# Patient Record
Sex: Female | Born: 1935
Health system: Southern US, Community
[De-identification: ages and names within clinical notes are randomized; demographics above are authoritative.]

## PROBLEM LIST (undated history)

## (undated) DIAGNOSIS — K219 Gastro-esophageal reflux disease without esophagitis: Secondary | ICD-10-CM

## (undated) DIAGNOSIS — R441 Visual hallucinations: Secondary | ICD-10-CM

## (undated) DIAGNOSIS — F411 Generalized anxiety disorder: Secondary | ICD-10-CM

## (undated) DIAGNOSIS — M1990 Unspecified osteoarthritis, unspecified site: Secondary | ICD-10-CM

## (undated) DIAGNOSIS — M8580 Other specified disorders of bone density and structure, unspecified site: Secondary | ICD-10-CM

## (undated) DIAGNOSIS — R112 Nausea with vomiting, unspecified: Secondary | ICD-10-CM

## (undated) DIAGNOSIS — H409 Unspecified glaucoma: Secondary | ICD-10-CM

## (undated) DIAGNOSIS — Z973 Presence of spectacles and contact lenses: Secondary | ICD-10-CM

## (undated) DIAGNOSIS — E7800 Pure hypercholesterolemia, unspecified: Secondary | ICD-10-CM

## (undated) DIAGNOSIS — R51 Headache: Secondary | ICD-10-CM

## (undated) DIAGNOSIS — Z85828 Personal history of other malignant neoplasm of skin: Secondary | ICD-10-CM

## (undated) DIAGNOSIS — M48062 Spinal stenosis, lumbar region with neurogenic claudication: Secondary | ICD-10-CM

## (undated) DIAGNOSIS — Z87440 Personal history of urinary (tract) infections: Secondary | ICD-10-CM

## (undated) DIAGNOSIS — T8859XA Other complications of anesthesia, initial encounter: Secondary | ICD-10-CM

## (undated) DIAGNOSIS — R2681 Unsteadiness on feet: Secondary | ICD-10-CM

## (undated) DIAGNOSIS — N393 Stress incontinence (female) (male): Secondary | ICD-10-CM

## (undated) DIAGNOSIS — F32A Depression, unspecified: Secondary | ICD-10-CM

## (undated) DIAGNOSIS — C801 Malignant (primary) neoplasm, unspecified: Secondary | ICD-10-CM

## (undated) DIAGNOSIS — I10 Essential (primary) hypertension: Secondary | ICD-10-CM

## (undated) DIAGNOSIS — F419 Anxiety disorder, unspecified: Secondary | ICD-10-CM

## (undated) DIAGNOSIS — K635 Polyp of colon: Secondary | ICD-10-CM

## (undated) DIAGNOSIS — R519 Headache, unspecified: Secondary | ICD-10-CM

## (undated) DIAGNOSIS — F329 Major depressive disorder, single episode, unspecified: Secondary | ICD-10-CM

## (undated) DIAGNOSIS — L280 Lichen simplex chronicus: Secondary | ICD-10-CM

## (undated) DIAGNOSIS — Z789 Other specified health status: Secondary | ICD-10-CM

## (undated) DIAGNOSIS — Z9889 Other specified postprocedural states: Secondary | ICD-10-CM

## (undated) DIAGNOSIS — T4145XA Adverse effect of unspecified anesthetic, initial encounter: Secondary | ICD-10-CM

## (undated) DIAGNOSIS — H3530 Unspecified macular degeneration: Secondary | ICD-10-CM

## (undated) HISTORY — DX: Generalized anxiety disorder: F41.1

## (undated) HISTORY — DX: Major depressive disorder, single episode, unspecified: F32.9

## (undated) HISTORY — DX: Anxiety disorder, unspecified: F41.9

## (undated) HISTORY — DX: Lichen simplex chronicus: L28.0

## (undated) HISTORY — PX: EYE SURGERY: SHX253

## (undated) HISTORY — DX: Visual hallucinations: R44.1

## (undated) HISTORY — DX: Polyp of colon: K63.5

## (undated) HISTORY — DX: Pure hypercholesterolemia, unspecified: E78.00

## (undated) HISTORY — DX: Personal history of urinary (tract) infections: Z87.440

## (undated) HISTORY — DX: Gastro-esophageal reflux disease without esophagitis: K21.9

## (undated) HISTORY — DX: Unspecified osteoarthritis, unspecified site: M19.90

## (undated) HISTORY — PX: BLADDER REPAIR: SHX76

## (undated) HISTORY — DX: Essential (primary) hypertension: I10

## (undated) HISTORY — DX: Other specified disorders of bone density and structure, unspecified site: M85.80

## (undated) HISTORY — DX: Stress incontinence (female) (male): N39.3

## (undated) HISTORY — PX: COLONOSCOPY: SHX174

## (undated) HISTORY — DX: Personal history of other malignant neoplasm of skin: Z85.828

## (undated) HISTORY — PX: TONSILLECTOMY AND ADENOIDECTOMY: SUR1326

## (undated) HISTORY — PX: CHOLECYSTECTOMY: SHX55

## (undated) HISTORY — DX: Depression, unspecified: F32.A

## (undated) HISTORY — PX: MOHS SURGERY: SUR867

## (undated) HISTORY — PX: OTHER SURGICAL HISTORY: SHX169

## (undated) HISTORY — PX: APPENDECTOMY: SHX54

---

## 1975-08-12 HISTORY — PX: ABDOMINAL HYSTERECTOMY: SHX81

## 1999-01-02 ENCOUNTER — Other Ambulatory Visit: Admission: RE | Admit: 1999-01-02 | Discharge: 1999-01-02 | Payer: Self-pay | Admitting: Gynecology

## 1999-08-21 ENCOUNTER — Encounter: Payer: Self-pay | Admitting: *Deleted

## 1999-08-21 ENCOUNTER — Emergency Department (HOSPITAL_COMMUNITY): Admission: EM | Admit: 1999-08-21 | Discharge: 1999-08-21 | Payer: Self-pay | Admitting: *Deleted

## 2000-01-22 ENCOUNTER — Other Ambulatory Visit: Admission: RE | Admit: 2000-01-22 | Discharge: 2000-01-22 | Payer: Self-pay | Admitting: Gynecology

## 2000-03-02 ENCOUNTER — Encounter (INDEPENDENT_AMBULATORY_CARE_PROVIDER_SITE_OTHER): Payer: Self-pay

## 2000-03-02 ENCOUNTER — Ambulatory Visit (HOSPITAL_COMMUNITY): Admission: RE | Admit: 2000-03-02 | Discharge: 2000-03-02 | Payer: Self-pay | Admitting: Gastroenterology

## 2000-03-17 ENCOUNTER — Ambulatory Visit (HOSPITAL_COMMUNITY): Admission: RE | Admit: 2000-03-17 | Discharge: 2000-03-17 | Payer: Self-pay | Admitting: Gastroenterology

## 2000-03-18 ENCOUNTER — Encounter: Payer: Self-pay | Admitting: Gastroenterology

## 2000-03-18 ENCOUNTER — Ambulatory Visit (HOSPITAL_COMMUNITY): Admission: RE | Admit: 2000-03-18 | Discharge: 2000-03-18 | Payer: Self-pay | Admitting: Gastroenterology

## 2000-03-20 ENCOUNTER — Ambulatory Visit (HOSPITAL_COMMUNITY): Admission: RE | Admit: 2000-03-20 | Discharge: 2000-03-20 | Payer: Self-pay | Admitting: Gastroenterology

## 2000-03-20 ENCOUNTER — Encounter: Payer: Self-pay | Admitting: Gastroenterology

## 2000-06-16 ENCOUNTER — Encounter: Payer: Self-pay | Admitting: Gastroenterology

## 2000-06-16 ENCOUNTER — Ambulatory Visit (HOSPITAL_COMMUNITY): Admission: RE | Admit: 2000-06-16 | Discharge: 2000-06-16 | Payer: Self-pay | Admitting: Gastroenterology

## 2001-07-19 ENCOUNTER — Other Ambulatory Visit: Admission: RE | Admit: 2001-07-19 | Discharge: 2001-07-19 | Payer: Self-pay | Admitting: Gynecology

## 2001-09-13 ENCOUNTER — Emergency Department (HOSPITAL_COMMUNITY): Admission: EM | Admit: 2001-09-13 | Discharge: 2001-09-13 | Payer: Self-pay | Admitting: Emergency Medicine

## 2001-09-13 ENCOUNTER — Encounter: Payer: Self-pay | Admitting: Emergency Medicine

## 2001-09-18 ENCOUNTER — Emergency Department (HOSPITAL_COMMUNITY): Admission: EM | Admit: 2001-09-18 | Discharge: 2001-09-18 | Payer: Self-pay | Admitting: Emergency Medicine

## 2001-11-18 ENCOUNTER — Emergency Department (HOSPITAL_COMMUNITY): Admission: EM | Admit: 2001-11-18 | Discharge: 2001-11-18 | Payer: Self-pay

## 2003-03-23 ENCOUNTER — Ambulatory Visit (HOSPITAL_COMMUNITY): Admission: RE | Admit: 2003-03-23 | Discharge: 2003-03-23 | Payer: Self-pay | Admitting: Orthopedic Surgery

## 2003-03-23 ENCOUNTER — Encounter: Payer: Self-pay | Admitting: Orthopedic Surgery

## 2003-03-27 ENCOUNTER — Inpatient Hospital Stay (HOSPITAL_COMMUNITY): Admission: RE | Admit: 2003-03-27 | Discharge: 2003-03-28 | Payer: Self-pay | Admitting: Orthopedic Surgery

## 2003-03-27 ENCOUNTER — Encounter: Payer: Self-pay | Admitting: Orthopedic Surgery

## 2003-03-27 ENCOUNTER — Encounter (INDEPENDENT_AMBULATORY_CARE_PROVIDER_SITE_OTHER): Payer: Self-pay | Admitting: *Deleted

## 2004-03-26 ENCOUNTER — Other Ambulatory Visit: Admission: RE | Admit: 2004-03-26 | Discharge: 2004-03-26 | Payer: Self-pay | Admitting: Gynecology

## 2004-07-18 ENCOUNTER — Ambulatory Visit: Payer: Self-pay | Admitting: Internal Medicine

## 2004-10-22 ENCOUNTER — Ambulatory Visit: Payer: Self-pay | Admitting: Internal Medicine

## 2004-10-29 ENCOUNTER — Ambulatory Visit: Payer: Self-pay | Admitting: Internal Medicine

## 2005-06-13 ENCOUNTER — Emergency Department (HOSPITAL_COMMUNITY): Admission: EM | Admit: 2005-06-13 | Discharge: 2005-06-14 | Payer: Self-pay | Admitting: Emergency Medicine

## 2005-06-14 ENCOUNTER — Ambulatory Visit: Payer: Self-pay | Admitting: Family Medicine

## 2005-06-16 ENCOUNTER — Ambulatory Visit: Payer: Self-pay | Admitting: Internal Medicine

## 2005-08-11 HISTORY — PX: URETHRAL SLING: SHX2621

## 2005-09-03 ENCOUNTER — Ambulatory Visit: Payer: Self-pay | Admitting: Internal Medicine

## 2005-10-29 ENCOUNTER — Encounter: Admission: RE | Admit: 2005-10-29 | Discharge: 2005-10-29 | Payer: Self-pay | Admitting: Rheumatology

## 2005-12-10 ENCOUNTER — Other Ambulatory Visit: Admission: RE | Admit: 2005-12-10 | Discharge: 2005-12-10 | Payer: Self-pay | Admitting: Gynecology

## 2006-07-10 ENCOUNTER — Ambulatory Visit (HOSPITAL_COMMUNITY): Admission: RE | Admit: 2006-07-10 | Discharge: 2006-07-11 | Payer: Self-pay | Admitting: Urology

## 2006-07-30 ENCOUNTER — Ambulatory Visit: Payer: Self-pay | Admitting: Internal Medicine

## 2007-06-22 ENCOUNTER — Encounter: Payer: Self-pay | Admitting: Internal Medicine

## 2007-10-19 ENCOUNTER — Encounter: Payer: Self-pay | Admitting: *Deleted

## 2007-10-19 DIAGNOSIS — F329 Major depressive disorder, single episode, unspecified: Secondary | ICD-10-CM

## 2007-10-19 DIAGNOSIS — C44309 Unspecified malignant neoplasm of skin of other parts of face: Secondary | ICD-10-CM | POA: Insufficient documentation

## 2007-10-19 DIAGNOSIS — N39498 Other specified urinary incontinence: Secondary | ICD-10-CM

## 2007-10-19 DIAGNOSIS — M1990 Unspecified osteoarthritis, unspecified site: Secondary | ICD-10-CM | POA: Insufficient documentation

## 2007-10-19 DIAGNOSIS — I10 Essential (primary) hypertension: Secondary | ICD-10-CM

## 2007-10-19 DIAGNOSIS — J309 Allergic rhinitis, unspecified: Secondary | ICD-10-CM

## 2007-10-19 DIAGNOSIS — K219 Gastro-esophageal reflux disease without esophagitis: Secondary | ICD-10-CM | POA: Insufficient documentation

## 2007-10-19 DIAGNOSIS — C44300 Unspecified malignant neoplasm of skin of unspecified part of face: Secondary | ICD-10-CM | POA: Insufficient documentation

## 2007-10-19 DIAGNOSIS — A3790 Whooping cough, unspecified species without pneumonia: Secondary | ICD-10-CM | POA: Insufficient documentation

## 2007-10-19 DIAGNOSIS — D126 Benign neoplasm of colon, unspecified: Secondary | ICD-10-CM

## 2007-10-19 DIAGNOSIS — Z9089 Acquired absence of other organs: Secondary | ICD-10-CM

## 2007-10-19 DIAGNOSIS — Z87448 Personal history of other diseases of urinary system: Secondary | ICD-10-CM | POA: Insufficient documentation

## 2007-10-19 HISTORY — DX: Essential (primary) hypertension: I10

## 2007-10-19 HISTORY — DX: Allergic rhinitis, unspecified: J30.9

## 2007-10-19 HISTORY — DX: Gastro-esophageal reflux disease without esophagitis: K21.9

## 2007-10-19 HISTORY — DX: Other specified urinary incontinence: N39.498

## 2007-11-17 ENCOUNTER — Encounter: Payer: Self-pay | Admitting: Internal Medicine

## 2008-03-06 ENCOUNTER — Telehealth: Payer: Self-pay | Admitting: Internal Medicine

## 2008-03-07 ENCOUNTER — Ambulatory Visit: Payer: Self-pay | Admitting: Internal Medicine

## 2008-03-07 DIAGNOSIS — J069 Acute upper respiratory infection, unspecified: Secondary | ICD-10-CM | POA: Insufficient documentation

## 2008-03-09 ENCOUNTER — Ambulatory Visit: Payer: Self-pay | Admitting: Internal Medicine

## 2008-05-01 ENCOUNTER — Encounter: Payer: Self-pay | Admitting: Internal Medicine

## 2008-08-22 ENCOUNTER — Encounter: Payer: Self-pay | Admitting: Internal Medicine

## 2008-11-28 ENCOUNTER — Telehealth: Payer: Self-pay | Admitting: Internal Medicine

## 2009-01-04 ENCOUNTER — Encounter: Payer: Self-pay | Admitting: Internal Medicine

## 2009-03-13 ENCOUNTER — Ambulatory Visit: Payer: Self-pay | Admitting: Internal Medicine

## 2009-03-13 DIAGNOSIS — IMO0002 Reserved for concepts with insufficient information to code with codable children: Secondary | ICD-10-CM

## 2009-03-13 DIAGNOSIS — M549 Dorsalgia, unspecified: Secondary | ICD-10-CM | POA: Insufficient documentation

## 2009-03-13 LAB — CONVERTED CEMR LAB
ALT: 20 units/L (ref 0–35)
AST: 22 units/L (ref 0–37)
Albumin: 3.8 g/dL (ref 3.5–5.2)
Basophils Relative: 0.4 % (ref 0.0–3.0)
CO2: 29 meq/L (ref 19–32)
Calcium: 9 mg/dL (ref 8.4–10.5)
Eosinophils Relative: 0.2 % (ref 0.0–5.0)
Glucose, Bld: 76 mg/dL (ref 70–99)
HCT: 42.2 % (ref 36.0–46.0)
HDL: 39.9 mg/dL (ref >39.00)
Monocytes Relative: 7.1 % (ref 3.0–12.0)
Neutrophils Relative %: 65.5 % (ref 43.0–77.0)
Platelets: 204 10*3/uL (ref 150.0–400.0)
Potassium: 3.9 meq/L (ref 3.5–5.1)
RBC: 4.35 M/uL (ref 3.87–5.11)
Sodium: 143 meq/L (ref 135–145)
TSH: 4.52 microintl units/mL (ref 0.35–5.50)
Total CHOL/HDL Ratio: 4
Triglycerides: 162 mg/dL — ABNORMAL HIGH (ref 0.0–149.0)
VLDL: 32.4 mg/dL (ref 0.0–40.0)
WBC: 6.6 10*3/uL (ref 4.5–10.5)

## 2009-03-14 DIAGNOSIS — E785 Hyperlipidemia, unspecified: Secondary | ICD-10-CM

## 2009-03-14 DIAGNOSIS — E663 Overweight: Secondary | ICD-10-CM | POA: Insufficient documentation

## 2009-03-14 HISTORY — DX: Hyperlipidemia, unspecified: E78.5

## 2009-05-15 ENCOUNTER — Encounter: Payer: Self-pay | Admitting: Internal Medicine

## 2009-07-10 ENCOUNTER — Encounter: Payer: Self-pay | Admitting: Internal Medicine

## 2009-07-11 ENCOUNTER — Telehealth: Payer: Self-pay | Admitting: Internal Medicine

## 2010-02-04 ENCOUNTER — Telehealth: Payer: Self-pay | Admitting: Internal Medicine

## 2010-02-07 ENCOUNTER — Telehealth: Payer: Self-pay | Admitting: Internal Medicine

## 2010-04-29 ENCOUNTER — Ambulatory Visit: Payer: Self-pay | Admitting: Internal Medicine

## 2010-04-29 LAB — CONVERTED CEMR LAB
Albumin: 4 g/dL (ref 3.5–5.2)
Chloride: 102 meq/L (ref 96–112)
Cholesterol: 201 mg/dL — ABNORMAL HIGH (ref 0–200)
Cortisol, Plasma: 5.5 ug/dL
Creatinine, Ser: 0.6 mg/dL (ref 0.4–1.2)
Direct LDL: 124.5 mg/dL
Folate: >20 ng/mL
HDL: 40.1 mg/dL (ref >39.00)
Potassium: 4.9 meq/L (ref 3.5–5.1)
Sodium: 140 meq/L (ref 135–145)
TSH: 3.21 microintl units/mL (ref 0.35–5.50)
Total CHOL/HDL Ratio: 5
Total Protein: 6.6 g/dL (ref 6.0–8.3)
VLDL: 64.4 mg/dL — ABNORMAL HIGH (ref 0.0–40.0)
Vitamin B-12: 472 pg/mL (ref 211–911)

## 2010-04-30 ENCOUNTER — Encounter: Payer: Self-pay | Admitting: Internal Medicine

## 2010-05-02 ENCOUNTER — Telehealth: Payer: Self-pay | Admitting: Internal Medicine

## 2010-05-23 ENCOUNTER — Encounter: Payer: Self-pay | Admitting: Internal Medicine

## 2010-07-08 ENCOUNTER — Telehealth: Payer: Self-pay | Admitting: Internal Medicine

## 2010-08-11 HISTORY — PX: URETHRAL SLING: SHX2621

## 2010-09-12 NOTE — Progress Notes (Signed)
Summary: [facility]  Phone Note Call from Patient Call back at [phone]   Summary of Call: Patient is requesting refill to go to [facility], did not give name of med.  Initial call taken by: Lamar Sprinkles, CMA,  February 04, 2010 3:36 PM  Follow-up for Phone Call        left message on machine for pt to call and leave message on Triage VM with the name(s) of meds for refill. Follow-up by: Margaret Pyle, CMA,  February 05, 2010 9:21 AM  Additional Follow-up for Phone Call Additional follow up Details #1::        left mess to call office back.................Marland KitchenLamar Sprinkles, CMA  February 06, 2010 1:45 PM     Additional Follow-up for Phone Call Additional follow up Details #2::    left message to callback office Follow-up by: Brenton Grills MA,  February 07, 2010 9:10 AM  Additional Follow-up for Phone Call Additional follow up Details #3:: Details for Additional Follow-up Action Taken: Pt informed  Additional Follow-up by: Lamar Sprinkles, CMA,  February 07, 2010 5:31 PM  Prescriptions: NADOLOL 40 MG  TABS (NADOLOL) Take one tablet at bedtime  #90 x 1   Entered by:   Lamar Sprinkles, CMA   Authorized by:   Jacques Navy MD   Signed by:   Lamar Sprinkles, CMA on 02/07/2010   Method used:   Faxed to ...       [facility] (mail-[facility])             , [place]         Ph: [phone]       Fax: [phone]   RxID:   [id number]

## 2010-09-12 NOTE — Letter (Signed)
[facility] [address] [place], [place]  [address] Phone: [phone]      May 07, 2010   Meline Waynick [address] [place], [place] [address]  RE:  LAB RESULTS  Dear  Ms. Mcnicholas,  The following is an interpretation of your most recent lab tests.  Please take note of any instructions provided or changes to medications that have resulted from your lab work.  LIPID PANEL:  Good - no changes needed Triglyceride: 162.0   Cholesterol: 161   LDL: 89   HDL: 39.90   Chol/HDL%:  4  THYROID STUDIES:  Thyroid studies normal TSH: 4.52     DIABETIC STUDIES:  Excellent - no changes needed Blood Glucose: 76   B12 normal  all labs are good. No metabolic explanation for sweats. If it keeps up will need to do chest x-ray and consider additional lab studies.   Cholesterol control not quite as good as last time but still at goal of LDL less than 130. Stick with low fat diet and regular exercise!   Sincerely Yours,    Jacques Navy MD   Patient: [name] Note: All result statuses are Final unless otherwise noted.  Tests: (1) Lipid Panel (LIPID)   Cholesterol          [H]  201 mg/dL                   3-086     ATP III Classification            Desirable:  < 200 mg/dL                    Borderline High:  200 - 239 mg/dL               High:  > = 240 mg/dL   Triglycerides        [H]  322.0 mg/dL                 5.7-846.9     Normal:  <150 mg/dL     Borderline High:  629 - 199 mg/dL   HDL                       52.84 mg/dL                 >13.24   VLDL Cholesterol     [H]  64.4 mg/dL                  4.0-10.2  CHO/HDL Ratio:  CHD Risk                             5                    Men          Women     1/2 Average Risk     3.4          3.3     Average Risk          5.0          4.4     2X Average Risk          9.6          7.1     3X Average Risk          15.0  11.0                           Tests: (2) Hepatic/Liver Function Panel  (HEPATIC)   Total Bilirubin           0.6 mg/dL                   1.6-1.0   Direct Bilirubin          0.1 mg/dL                   9.6-0.4   Alkaline Phosphatase      75 U/L                      39-117   AST                       19 U/L                      0-37   ALT                       17 U/L                      0-35   Total Protein             6.6 g/dL                    5.4-0.9   Albumin                   4.0 g/dL                    8.1-1.9  Tests: (3) BMP (METABOL)   Sodium                    140 mEq/L                   135-145   Potassium                 4.9 mEq/L                   3.5-5.1   Chloride                  102 mEq/L                   96-112   Carbon Dioxide            31 mEq/L                    19-32   Glucose                   76 mg/dL                    14-78   BUN                       13 mg/dL                    2-95   Creatinine                0.6 mg/dL  0.4-1.2   Calcium                   9.6 mg/dL                   1.9-14.7   GFR                       96.80 mL/min                >60  Tests: (4) TSH (TSH)   FastTSH                   3.21 uIU/mL                 0.35-5.50  Tests: (5) B12 + Folate Panel (B12/FOL)   Vitamin B12               472 pg/mL                   211-911   Folate                    >20.0 ng/mL     Deficient  0.4 - 3.4 ng/mL     Indeterminate  3.4 - 5.4 ng/mL     Normal  >5.4 ng/mL  Tests: (6) Cortisol (CORT)   Cortisol                  5.5 ug/dL     AM:  4.3 - 82.9 ug/dL     PM:  3.1 - 56.2 ug/dL  Tests: (7) Cholesterol LDL - Direct (DIRLDL)  Cholesterol LDL - Direct                             124.5 mg/dL

## 2010-09-12 NOTE — Progress Notes (Signed)
Summary: [name]  Phone Note Refill Request Message from:  Fax from Pharmacy on July 08, 2010 3:59 PM  Refills Requested: Medication #1:  ALPRAZOLAM 0.5 MG TABS 1 by mouth q6 as needed.   Last Refilled: 09/2009 please Advise refill for pt  Initial call taken by: Ami Bullins CMA,  July 08, 2010 3:59 PM  Follow-up for Phone Call        ok x 5 Follow-up by: Jacques Navy MD,  July 08, 2010 5:54 PM    Prescriptions: ALPRAZOLAM 0.5 MG TABS (ALPRAZOLAM) 1 by mouth q6 as needed  #60 x 5   Entered by:   Lamar Sprinkles, CMA   Authorized by:   Jacques Navy MD   Signed by:   Lamar Sprinkles, CMA on 07/09/2010   Method used:   Telephoned to ...       [facility]  [place]. [phone]* (retail)       [address]       [place], [place]  [address]       Ph: [phone]       Fax: [phone]   RxID:   [id number]

## 2010-09-12 NOTE — Progress Notes (Signed)
    Immunization History:  Influenza Immunization History:    Influenza:  historical lot# [id number] exp 01/18/2011 right arm (04/28/2010)

## 2010-09-12 NOTE — Assessment & Plan Note (Signed)
Summary: ov to discuss labs   Vital Signs:  Patient profile:   75 year old female Height:      61 inches Weight:      176 pounds BMI:     33.38 O2 Sat:      97 % on Room air Temp:     97.0 degrees F oral Pulse rate:   66 / minute BP sitting:   128 / 80  (left arm) Cuff size:   regular  Vitals Entered By: Bill Salinas CMA (April 29, 2010 11:28 AM)  O2 Flow:  Room air CC: pt here for evaluation for rash under both of her breast.  She also wants to discuss labs that her gyn recommended/ ab   Primary Care Provider:  Jacques Navy MD  CC:  pt here for evaluation for rash under both of her breast.  She also wants to discuss labs that her gyn recommended/ ab.  History of Present Illness: Patinet presents for intermittent sweats - day and night.  She wants a Rx for a shingles vaccine  She wants to get her vytorin from [facility].  She has a rash under the both breasts.  She is wearing a neuromodulation device for OAB - part of an experimental protocol.    Current Medications (verified): 1)  Nadolol 40 Mg  Tabs (Nadolol) .... Take One Tablet At Bedtime 2)  Aspirin 81 Mg  Tabs (Aspirin) .... Take One Tablet Once Daily 3)  Vytorin 10-20 Mg  Tabs (Ezetimibe-Simvastatin) .... Take One Tablet Once Daily 4)  Prilosec 20 Mg  Cpdr (Omeprazole) .... Take One Tablet Once Daily 5)  Effexor Xr 150 Mg  Cp24 (Venlafaxine Hcl) .... Take One Tablet Two Times A Day 6)  I Caps - Vitamins .... 2 Daily 7)  Zolpidem Tartrate 5 Mg Tabs (Zolpidem Tartrate) .... 1/2 Tab Daily 8)  Alprazolam 0.5 Mg Tabs (Alprazolam) .Marland Kitchen.. 1 By Mouth Q6 As Needed  Allergies (verified): 1)  ! Codeine  Past History:  Past Medical History: Last updated: 10/19/2007 POLYP, COLON (ICD-211.3) DEPRESSION (ICD-311) GASTROESOPHAGEAL REFLUX DISEASE (ICD-530.81) OSTEOARTHRITIS (ICD-715.90) UTI'S, HX OF (ICD-V13.00) STRESS INCONTINENCE (ICD-788.39) HYPERTENSION (ICD-401.9) ALLERGY (ICD-995.3) Hx of WHOOPING COUGH  (ICD-033.9)    Past Surgical History: Last updated: Mar 30, 2009 * BLADDER REPAIR WITH MESH SLING. * MOHS PROCEDURE TO REMOVE BASAL CELL-no * VERTEBROPLASTY SECONDARY TO TRAUMATIC COMPRESSION FRACTURE CHOLECYSTECTOMY, HX OF (ICD-V45.79) * HYSTERECTOMY TONSILLECTOMY AND ADENOIDECTOMY, HX OF (ICD-V45.79) APPENDECTOMY, HX OF (ICD-V45.79)  Family History: Last updated: 2009/03/30 Father -deceased: prostate cancer Mother - deceased: EtOH, OA, CAD/CHF, CVA Neg - colon or breast cancer; DM  Social History: Last updated: Mar 30, 2009 HSG, Attended [employer] Married '68 1 son '66, 1 daughter '68; 5 grandhcildren work: retired Runner, broadcasting/film/video marriage in good health SO - healthy Former Smoker  Mrs. Dimaano expresses sadness due to ongoing divorce proceedings involving her son (and her 3 grandchildren).  They live in [place] and the son and wife are still living together, but there has been limited visiting opportunity.  Mrs. Marik became tearful, but acknowledges that she feels she has enough support at home to discuss problems and is able to speak with her son about them.  Patient endorses that her prior history of depression is being well managed with Effexor, but was interested in an additional Rx Horatio Pel) for more acute management of these current stressors.  (8/10)  Review of Systems  The patient denies anorexia, fever, weight loss, chest pain, dyspnea on exertion, abdominal pain, severe indigestion/heartburn,  difficulty walking, depression, abnormal bleeding, and enlarged lymph nodes.    Physical Exam  General:  overweight white female in no distress Head:  normocephalic, atraumatic, and no abnormalities observed.   Eyes:  vision grossly intact, pupils equal, and pupils round.   Neck:  supple and no thyromegaly.   Lungs:  Normal respiratory effort, chest expands symmetrically. Lungs are clear to auscultation, no crackles or wheezes. Heart:  normal rate and regular rhythm.   Skin:   erythematous macular rash under both breasts with small satelite lesions.  Psych:  Oriented X3 and memory intact for recent and remote.     Impression & Recommendations:  Problem # 1:  SWEATING (ICD-780.8) Patient with sweats that occur throughout the day. No weight loss, no enlarged lymphnodes.  Pln - labs to r/o metabolic cause of sweats.  Orders: TLB-TSH (Thyroid Stimulating Hormone) (84443-TSH) TLB-B12 + Folate Pnl (16109_60454-U98/JXB) TLB-Cortisol (82533-CORT)  Addendum - labs are all normal  Problem # 2:  HYPERLIPIDEMIA, CONTROLLED (ICD-272.4) Due for followup labs  Her updated medication list for this problem includes:    Vytorin 10-20 Mg Tabs (Ezetimibe-simvastatin) .Marland Kitchen... Take one tablet once daily  Orders: TLB-Lipid Panel (80061-LIPID) TLB-Hepatic/Liver Function Pnl (80076-HEPATIC) Prescription Created Electronically [phone])  Addendum - LDL 124.5 - at goal of 130 or less  Plan - continue present medications.  Problem # 3:  FUNGAL DERMATITIS (ICD-111.9) Patient with red, macular rash in the intertriginous area under both breasts c/w yeast infection.  Plan - otc lamisil applied two times a day.  Complete Medication List: 1)  Nadolol 40 Mg Tabs (Nadolol) .... Take one tablet at bedtime 2)  Aspirin 81 Mg Tabs (Aspirin) .... Take one tablet once daily 3)  Vytorin 10-20 Mg Tabs (Ezetimibe-simvastatin) .... Take one tablet once daily 4)  Prilosec 20 Mg Cpdr (Omeprazole) .... Take one tablet once daily 5)  Effexor Xr 150 Mg Cp24 (Venlafaxine hcl) .... Take one tablet two times a day 6)  I Caps - Vitamins  .... 2 daily 7)  Zolpidem Tartrate 5 Mg Tabs (Zolpidem tartrate) .... 1/2 tab daily 8)  Alprazolam 0.5 Mg Tabs (Alprazolam) .Marland Kitchen.. 1 by mouth q6 as needed  Other Orders: TLB-BMP (Basic Metabolic Panel-BMET) (80048-METABOL)  Patient Instructions: 1)  Sweats - will check thyroid and adrenal function as well as B12 levels looking for reversible cause 2)  Rash - a  yeast infection. Plan - after drying the area very well apply a very thin coat of OTC lamisil (terbenafine) and does this twice a day. 3)  Will check routine labs today to follow cholesterol and Blood pressure.  Prescriptions: VYTORIN 10-20 MG  TABS (EZETIMIBE-SIMVASTATIN) Take one tablet once daily  #90 x 3   Entered and Authorized by:   Jacques Navy MD   Signed by:   Jacques Navy MD on 04/29/2010   Method used:   Faxed to ...       [facility] ([facility]             , [place]         Ph: [phone]       Fax: [phone]   RxID:   [id number]    Immunization History:  Influenza Immunization History:    Influenza:  historical (04/25/2010)

## 2010-09-12 NOTE — Progress Notes (Signed)
Summary: REFILL - [facility]   Phone Note Refill Request   Refills Requested: Medication #1:  EFFEXOR XR 150 MG  CP24 Take one tablet two times a day To go to [facility] ok?   Initial call taken by: Lamar Sprinkles, CMA,  February 07, 2010 5:32 PM  Follow-up for Phone Call        ok for refill x 5 Follow-up by: Jacques Navy MD,  February 07, 2010 6:06 PM    Prescriptions: EFFEXOR XR 150 MG  CP24 (VENLAFAXINE HCL) Take one tablet two times a day  #180 x 2   Entered by:   Margaret Pyle, CMA   Authorized by:   Jacques Navy MD   Signed by:   Margaret Pyle, CMA on 02/08/2010   Method used:   Faxed to ...       [facility] ([facility])             , [place]         Ph: [phone]       Fax: [phone]   RxID:   [id number]

## 2010-12-05 ENCOUNTER — Encounter: Payer: Self-pay | Admitting: Internal Medicine

## 2010-12-05 ENCOUNTER — Ambulatory Visit (INDEPENDENT_AMBULATORY_CARE_PROVIDER_SITE_OTHER): Payer: Medicare Other | Admitting: Internal Medicine

## 2010-12-05 VITALS — BP 102/62 | HR 79 | Temp 97.0°F | Resp 16 | Wt 176.5 lb

## 2010-12-05 DIAGNOSIS — K1370 Unspecified lesions of oral mucosa: Secondary | ICD-10-CM

## 2010-12-05 MED ORDER — DESOXIMETASONE 0.25 % EX OINT: 1.0000 mg | TOPICAL_OINTMENT | Freq: Two times a day (BID) | CUTANEOUS | Status: DC

## 2010-12-05 NOTE — Patient Instructions (Signed)
Small lesion under the tongue which may be viral or just an irritation. It does not look dangerous, e.g. Not a cancer in appearance. Plan - topicort ointment applied twice a day. If this doesn't get better or if it gets larger will refer to ENT.

## 2010-12-05 NOTE — Progress Notes (Signed)
  Subjective:    Patient ID: [name], female    DOB: 07-30-1936, 75 y.o.   MRN: [id number]  HPI Mrs. Consalvo presents with a four week history of a sore by the frenulum.No recollection of thermal injury. No bite injury. She thought it was yeast. NO fevers, no pain with swallow. No lymphadenopathy, change in taste.  Past Medical History  Diagnosis Date  . Colon polyps   . Depression   . GERD (gastroesophageal reflux disease)   . Osteoarthritis   . Hx: UTI (urinary tract infection)   . Stress incontinence, female   . Hypertension   . Whooping cough    Past Surgical History  Procedure Date  . Bladder repair   . Mohs surgery     procedure to remove basal cell  . Vertebroplasty secondary to traumatic compression fracture   . Cholecystectomy   . Abdominal hysterectomy   . Tonsillectomy and adenoidectomy   . Appendectomy    No family history on file. History   Social History  . Marital Status: Married    Spouse Name: N/A    Number of Children: N/A  . Years of Education: N/A   Occupational History  . Not on file.   Social History Main Topics  . Smoking status: Never Smoker   . Smokeless tobacco: Not on file  . Alcohol Use: Not on file  . Drug Use: Not on file  . Sexually Active: Not on file   Other Topics Concern  . 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 Ferrebee expresses sadness due to ongoing divorce proceedings involving her son (and her 3 grandchildren). They live in [place] and the son and wife are still living together, but there has been limited visiting opportunity. Mrs. Petrizzo became tearful, but acknowledges that she feels she has enough support at home to discuss problems and is able to speak with her son about them. Patient endorses that her prior history of depression is being well managed with effexor, but was  interested  In an additional RX (Xanax) for more acute management of these current stressors. 08/10        Review of Systems Aside from HPI described symptoms all major systems review is negative.     Objective:   Physical Exam HEENT - [place]/AT, no buccal lesions, surface of the tongue looks normal. At the frenulum there is a raised round lesion to the left of midline.        Assessment & Plan:  1. Nodule, frenulum - a benign appearing lesion - granuloma.  Plan - topicort 0.25% ointment bid to reduce size           Watchful waiting - for persistence or change in size or characteristics will refer to ENT

## 2010-12-20 ENCOUNTER — Telehealth: Payer: Self-pay | Admitting: *Deleted

## 2010-12-20 DIAGNOSIS — K1370 Unspecified lesions of oral mucosa: Secondary | ICD-10-CM | POA: Insufficient documentation

## 2010-12-20 NOTE — Telephone Encounter (Signed)
Entered referral to [facility] ENT

## 2010-12-20 NOTE — Telephone Encounter (Signed)
Patient requesting referral to ENT as mouth has not improved

## 2010-12-20 NOTE — Telephone Encounter (Signed)
Patient informed. 

## 2010-12-27 NOTE — Op Note (Signed)
NAMEGIANNAMARIE, [name]              ACCOUNT NO.:  [id number]   MEDICAL RECORD NO.:  [id number]          PATIENT TYPE:  AMB   LOCATION:  [facility]                         FACILITY:  [facility]   PHYSICIAN:  Jamison Neighbor, M.D.  DATE OF BIRTH:  02/04/1936   DATE OF PROCEDURE:  07/10/2006  DATE OF DISCHARGE:                               OPERATIVE REPORT   PREOPERATIVE DIAGNOSIS:  Stress urinary incontinence, cystocele.   POSTOPERATIVE DIAGNOSIS:  Stress urinary incontinence, cystocele.   PROCEDURE:  Anterior repair with Perigee mesh, SPARC mid-urethral sling,  cystoscopy.   ATTENDING SURGEON:  Logan Bores.   RESIDENT:  Terie Purser.   ANESTHESIA:  General endotracheal.   COMPLICATIONS:  None.   BLOOD LOSS:  150 mL.   DISPOSITION:  Stable to post anesthesia care unit.   INDICATIONS:  [name] is a 75 year old female who has had an  evaluation for mixed stress and urgency urinary incontinence.  She has  undergone urodynamics with findings of slight instability.  She also had  evidence of stress urinary incontinence with a leak point pressure of  75.  This was with her cystocele reduced.  She was counseled regarding  benefits and risks of anterior repair with placement of the sling to  address her cystocele and stress incontinence.  She has agreed to  proceed.  This is after a full discussion of benefits and risks.   DESCRIPTION OF PROCEDURE:  The patient was brought to the operating  room.  She was properly identified.  Time-out was performed to confirm  correct patient and procedure.  She was administered general anesthesia  and given preoperative antibiotics and placed in the dorsal lithotomy  position, prepped and draped in sterile fashion.  On examination, she  did have approximately a grade 2 to 3 cystocele.  A Foley catheter was  placed.  Weighted vaginal speculum was used to expose the anterior  vagina.  Lidocaine mixture with dilute epinephrine was used to  infiltrate the  anterior vaginal wall.  We then made an incision from the  mid urethra down to the proximal portion of the vagina.  Sharp  dissection was used to develop a flap of the anterior vagina to expose  the cystocele.  This was dissected out laterally, and the endopelvic  fascia was broken through sharply with scissors.  Once we had the  cystocele mobilized, this was plicated using a series of 2-0 Vicryl  sutures in a horizontal mattress fashion.   We then proceeded to place the [facility] sling.  Two 1-cm incisions were  made approximately 1 cm lateral to the midline approximately 1  fingerbreadth above the pubic bone.  The [facility] needles were passed  alongside the bone onto the surgeon's finger and delivered through the  incision.  This was repeated on the contralateral side.  At this point,  we cystoscoped the patient.  There was no evidence of bleeding in the  bladder.  This was examined with the bladder full, and there was no  evidence of perforation or intramural placement of the trocars.  There  was no evidence of urethral involvement.  Cystoscope was removed.  The  Foley cath was placed, the bladder drained and the [facility] sling was  placed.   We then proceeded with placement of Perigee mesh system.  Incisions were  made in the groin crease at the level of the clitoris.  Second incision  was made approximately 3 cm inferior to this.  The Perigee trocars were  then placed onto the surgeon's finger and delivered through the  incision.  Care was taken to ensure that the vaginal mucosa was not  involved.  Again, cystoscopy was performed.  There was no evidence of a  bladder perforation or intramural placement of the trocar in the bladder  or urethra.  The Perigee system was then affixed to the trocars and  delivered into the incision.  This was placed in a tension-free manner.  When proper placement of the mesh was confirmed, we then reconfirmed  proper tensioning of the sling.  A right-angle clamp  was placed between  the sling and urethra.  The sling was mobile and not under any tension.  At this point, all of the trocars were removed, and the external sling  and mesh material were trimmed.  Copious amounts of antibiotic  irrigation were used.  Approximately 5 mm of excess vaginal mucosa were  trimmed, and the vaginal mucosa was then reapproximated in a running  fashion using a 2-0 Vicryl suture.  This resulted in a nice repair.  There did appear to be a very slight rectocele which did not require  intervention at this point.  A vaginal packing was placed in the vagina.  The [facility] and the Perigee incisions were closed with Dermabond.  Foley  catheter remained in place.  The patient was then awoken from anesthesia  and transported to the recovery room in stable condition.  There were no  complications.  Please note that Dr. Logan Bores was present and participated  in all aspects of this procedure.     ______________________________  Terie Purser, MD      Jamison Neighbor, M.D.  Electronically Signed    JH/MEDQ  D:  07/10/2006  T:  07/11/2006  Job:  [id number]

## 2010-12-27 NOTE — Procedures (Signed)
[facility]  Patient:    [name], [name]                     MRN: [id number] Proc. Date: 03/02/00 Adm. Date:  [id number] Disc. Date: [id number] Attending:  Deneen Harts CC:         Gaetano Hawthorne. Lily Peer, M.D.             Neta Mends. Panosh, M.D. [facility]                           Procedure Report  PROCEDURE:  Colonoscopy.  INDICATION FOR PROCEDURE:  A 75 year old white female undergoing colonoscopy to evaluate hematochezia and for colorectal neoplasia surveillance. Prior sigmoidoscopy remotely. The patient denies change in bowel habits but does note intermittent bright red blood per rectum. There is no significant family history of colon cancer.  DESCRIPTION OF PROCEDURE:  After reviewing the nature of the procedure with the patient including potential risks of hemorrhage and perforation, informed consent was signed.  The patient was premedicated receiving IV sedation totaling Versed 12 mg, fentanyl 125 mcg administered IV in divided doses prior to and during the course of the procedure.  Using the Olympus pediatric PCF-140L video colonoscope, the rectum was intubated after a normal digital examination revealing no evidence of perianal or intrarectal pathology. The scope was advanced around the colon to the ileocecal valve. Deep intubation of the cecum could not be accomplished, despite rotating the patient into a supine and then right lateral decubitus position. The scope was exchanged for a CF-140L video colonoscope.  Reintubation was more easily accomplished and the scope advanced into the cecum with the patient in supine position.  Excellent preparation throughout.  In the cecum, an 8 mm diameter sessile polyp was resected with hot biopsy forceps, recovered and submitted to pathology. No additional neoplasia was identified. The scope was slowly withdrawn with careful inspection of the entire colon in a retrograde manner.  No additional  abnormalities were noted. Specifically, there were no additional colon polyps identified, no malignancy. No diverticular disease, mucosal inflammation or vascular lesion seen. Retroflexed view in the rectal vault was normal. The colon was decompressed, scope withdrawn.  The patient tolerated the procedure without difficulty being maintained on DataScope monitor and low flow oxygen throughout.  Time 3, technical 3, preparation 1, total score equal 7.  ASSESSMENT: 1. Cecal polyp--8 mm in diameter, sessile, benign appearing. Resected with hot    biopsy forceps, recovered and submitted to pathology. 2. Hematochezia--suspect intermittent anorectal fissure. No evidence for    hemorrhoids.  RECOMMENDATIONS: 1. Rectal care p.r.n. 2. Rule out pathology. 3. Post polypectomy instructions reviewed. 4. Repeat colonoscopy in 2 years to inspect for recurrent colorectal    neoplasia. 5. The patient to contact the office in 1 week to follow-up pathology report. DD:  03/02/00 TD:  03/05/00 Job: [id number]

## 2010-12-27 NOTE — Assessment & Plan Note (Signed)
[facility]                                 ON-CALL NOTE   [name], [name]                       MRN:          [id number]  DATE:10/23/2007                            DOB:          1936/05/24    CALLER:  [name].   PHONE NUMBER:  [phone].   SUBJECTIVE:  [name] calls in regards to his wife who is upset after  having lost a dog.  He is wondering whether she can take his Zoloft and  get any relief.  She is tearful and sad since the dog was put to sleep  this morning.   ASSESSMENT AND PLAN:  Discussed that Zoloft will not help on an  immediate basis.  Recommended initially using sleep aid at night for  relaxation.  If her symptoms are severe she should be [place]  If she continues to have  problems she should follow up with her primary care doctor.     Gabrielle Nora, MD  Electronically Signed    AB/MedQ  DD: 10/24/2007  DT: 10/24/2007  Job #: [id number]

## 2010-12-27 NOTE — Consult Note (Signed)
[facility]  Patient:    [name]                      MRN: [id number] Proc. Date: 08/21/99 Adm. Date:  [id number] Attending:  Carmelina Peal CC:         Neta Mends. Panosh, M.D. [facility]                          Consultation Report  HISTORY OF PRESENT ILLNESS:  This 75 year old married white female presented to the emergency room today for evaluation of chest pain.  The patient said she has had left upper anterior chest pain for three weeks. She states her pain comes and goes, sometimes it lasts two to three minutes. Sometimes it will last a few seconds.  It can occur up to 10 times per day.  She describes and points to an area in the left upper chest wall just below the sternum at the junction of the costosternal junction.  She describes the pain as sharp like a knife, on a scale of 1 to 10, a 4 or 5.  It sometimes shoots straight through to her back.  It is not associated with exertion.  Indeed, she walks at school and  describes no pain.  She says most of the discomfort really occurs when she is sitting around resting.  She has had no history of any cardiac or pulmonary symptoms.  She has had no nausea, vomiting, diarrhea, shortness of breath, diaphoresis, etc.  She denies any history of trauma.  PAST MEDICAL HISTORY:  Past hospitalizations include cholecystectomy, hysterectomy, childbirth x 2.  Past illnesses:  None.  Injuries:  None.  DRUG ALLERGIES:  None.  CODEINE makes her nauseated.  CURRENT MEDICATIONS:  1. Nortriptyline 150 q.h.s.  2. Corgard 40 q.h.s. as prophylaxis for migraines.  3. She takes Premarin 0.625 q.d.  4. Aciphex for gastroesophageal reflux disease.  5. She takes Zomig on a p.r.n. basis for migraines.  She does not smoke or drink any alcohol.  The patient is a Geophysicist/field seismologist at [employer]  FAMILY HISTORY:  Mother is 72, alive and well except she has mild CHF.  Father ied at 54 with lung  cancer from smoking.  One brother, one sister, both in excellent health.  Family history for coronary disease negative.  She has never had any hypertension, diabetes and she has had her lipids checked in the past and they ave been normal.  PHYSICAL EXAMINATION:  VITAL SIGNS:  Stable.  She is afebrile.  GENERAL:  She is a well-developed, well-nourished white female in no acute distress.  CARDIAC:  Negative.  LUNG:  Negative.  There was point tenderness in the left upper anterior chest wall where the fifth rib meets the sternum and on questioning her, she said yes indeed, thats her pain.  LABORATORY DATA:  Laboratory studies ordere by Dr. Stacie Acres showed a normal CBC, CPK-MB of 41 with a negative MB of 09.4, a troponin which was negative at 0.03, an EKG which was normal, and a chest x-ray which was normal.  IMPRESSION:  Chest wall pain.  PLAN:  Reassured.  She is advised to take Advil 800 mg three times a day p.r.n. for the chest wall, followed by Dr. Fabian Sharp on a p.r.n. basis. DD:  08/21/99 TD:  08/22/99 Job: [id number]

## 2010-12-27 NOTE — Op Note (Signed)
NAMESABELLA, TRAORE              ACCOUNT NO.:  [id number]   MEDICAL RECORD NO.:  [id number]          PATIENT TYPE:  OIB   LOCATION:  1432                         FACILITY:  [facility]   PHYSICIAN:  Jamison Neighbor, M.D.  DATE OF BIRTH:  1935-12-14   DATE OF PROCEDURE:  07/11/2006  DATE OF DISCHARGE:  07/11/2006                               OPERATIVE REPORT   PREOPERATIVE DIAGNOSES:  1. Cystocele.  2. Stress urinary incontinence.   POSTOPERATIVE DIAGNOSES:  1. Cystocele.  2. Stress urinary incontinence.   PROCEDURE:  Anterior repair with mesh, pubovaginal sling, cystoscopy.   SURGEON:  Dr. Logan Bores   ASSISTANT:  Dr. Wilson Singer   ANESTHESIA:  General.   COMPLICATIONS:  None.   DRAINS:  A Foley catheter.   BRIEF HISTORY:  This 75 year old female has a cystocele plus stress  urinary incontinence.  She is to undergo anterior repair plus placement  of a sling.  She understands the risks and benefits of the procedure  including the possibility of postoperative retention.  Full informed  consent was obtained.   DESCRIPTION OF PROCEDURE:  After the successful induction of general  anesthesia, the patient was placed in the dorsal lithotomy position,  prepped with Betadine, and draped in the usual sterile fashion.  The  anterior vaginal mucosa was infiltrated with local anesthetic.  An  incision was made beginning at the mid urethra, extending all the way  back to the cuff of the vagina.  Flaps were raised bilaterally exposing  the cystocele.  The cystocele was reduced with a series of vertical  mattress sutures of 2-0 Vicryl.  The patient had the Perigee mesh placed  in the usual fashion through the puncture holes and brought the mesh to  the obturator fossa.  This was trimmed in position.  The patient still  had a little bit of weakness at the urethra itself, so a secondary SPARC  sling was placed through 2 small stab incisions at the top.  When the  sling was placed and when the mesh  was placed, cystoscopy was performed  with both 12-degree and 70-degree lenses for each step to make sure  there was no injury to the bladder, and no injury  occurred.  The sling tension was set in the usual fashion.  The area was  irrigated, and the mucosa was trimmed.  It was then closed with 2-0  Vicryl.  The patient had a Foley catheter left in place overnight.  She  will stay in the hospital for 23-hour observation and will be discharged  the following day.           ______________________________  Jamison Neighbor, M.D.  Electronically Signed     RJE/MEDQ  D:  08/07/2006  T:  08/07/2006  Job:  [id number]

## 2011-01-02 ENCOUNTER — Other Ambulatory Visit: Payer: Self-pay | Admitting: Internal Medicine

## 2011-01-03 NOTE — Telephone Encounter (Signed)
Please Adviser refills

## 2011-01-06 NOTE — Telephone Encounter (Signed)
Ok fr refill prn

## 2011-03-19 ENCOUNTER — Other Ambulatory Visit: Payer: Self-pay | Admitting: Internal Medicine

## 2011-04-04 ENCOUNTER — Telehealth: Payer: Self-pay

## 2011-04-04 MED ORDER — EZETIMIBE-SIMVASTATIN 10-20 MG PO TABS: 1.0000 | ORAL_TABLET | Freq: Every day | ORAL | Status: DC

## 2011-04-04 NOTE — Telephone Encounter (Signed)
Refill vytorin

## 2011-04-07 ENCOUNTER — Other Ambulatory Visit: Payer: Self-pay | Admitting: *Deleted

## 2011-04-07 MED ORDER — EZETIMIBE-SIMVASTATIN 10-20 MG PO TABS: 1.0000 | ORAL_TABLET | Freq: Every day | ORAL | Status: DC

## 2011-07-28 ENCOUNTER — Other Ambulatory Visit: Payer: Self-pay | Admitting: Internal Medicine

## 2011-09-10 ENCOUNTER — Other Ambulatory Visit: Payer: Self-pay | Admitting: Internal Medicine

## 2011-09-10 NOTE — Telephone Encounter (Signed)
Done

## 2012-01-14 ENCOUNTER — Ambulatory Visit: Payer: BC Managed Care – PPO | Admitting: Internal Medicine

## 2012-01-21 ENCOUNTER — Ambulatory Visit: Payer: BC Managed Care – PPO | Admitting: Internal Medicine

## 2012-01-24 ENCOUNTER — Other Ambulatory Visit: Payer: Self-pay | Admitting: Internal Medicine

## 2012-01-26 ENCOUNTER — Telehealth: Payer: Self-pay | Admitting: *Deleted

## 2012-01-26 NOTE — Telephone Encounter (Signed)
Patient request to have blood work prior to her visit next week with you 01/30/2012.[name] Patient would like for  You to call her.CB # [phone] CB@

## 2012-01-26 NOTE — Telephone Encounter (Signed)
Rx REFILL SENT TO [facility]. PATIENT NEED TO MAKE APPT. FOR FURTHER REFILL

## 2012-01-26 NOTE — Telephone Encounter (Signed)
Policy is to get labs incident to (at the time of) office visit.

## 2012-01-27 NOTE — Telephone Encounter (Signed)
Called patient- them is the rules

## 2012-01-27 NOTE — Telephone Encounter (Signed)
I explained that to [name] but still request to talk with you.

## 2012-01-30 ENCOUNTER — Other Ambulatory Visit (INDEPENDENT_AMBULATORY_CARE_PROVIDER_SITE_OTHER): Payer: Medicare Other

## 2012-01-30 ENCOUNTER — Encounter: Payer: Self-pay | Admitting: Internal Medicine

## 2012-01-30 ENCOUNTER — Ambulatory Visit (INDEPENDENT_AMBULATORY_CARE_PROVIDER_SITE_OTHER): Payer: Medicare Other | Admitting: Internal Medicine

## 2012-01-30 VITALS — BP 130/80 | HR 68 | Temp 97.6°F | Resp 16 | Wt 174.0 lb

## 2012-01-30 DIAGNOSIS — M1990 Unspecified osteoarthritis, unspecified site: Secondary | ICD-10-CM

## 2012-01-30 DIAGNOSIS — K219 Gastro-esophageal reflux disease without esophagitis: Secondary | ICD-10-CM

## 2012-01-30 DIAGNOSIS — I10 Essential (primary) hypertension: Secondary | ICD-10-CM

## 2012-01-30 DIAGNOSIS — R300 Dysuria: Secondary | ICD-10-CM

## 2012-01-30 DIAGNOSIS — E785 Hyperlipidemia, unspecified: Secondary | ICD-10-CM

## 2012-01-30 DIAGNOSIS — B372 Candidiasis of skin and nail: Secondary | ICD-10-CM

## 2012-01-30 LAB — URINALYSIS, ROUTINE W REFLEX MICROSCOPIC
Specific Gravity, Urine: 1.025 (ref 1.000–1.030)
Urine Glucose: NEGATIVE
Urobilinogen, UA: 0.2 (ref 0.0–1.0)
pH: 6 (ref 5.0–8.0)

## 2012-01-30 LAB — COMPREHENSIVE METABOLIC PANEL
ALT: 22 U/L (ref 0–35)
AST: 21 U/L (ref 0–37)
Albumin: 4.2 g/dL (ref 3.5–5.2)
CO2: 31 mEq/L (ref 19–32)
Calcium: 10 mg/dL (ref 8.4–10.5)
Chloride: 104 mEq/L (ref 96–112)
Creatinine, Ser: 0.7 mg/dL (ref 0.4–1.2)
GFR: 84.09 mL/min (ref >60.00)
Potassium: 4.7 mEq/L (ref 3.5–5.1)

## 2012-01-30 LAB — HEPATIC FUNCTION PANEL
ALT: 22 U/L (ref 0–35)
Albumin: 4.2 g/dL (ref 3.5–5.2)
Total Protein: 7.3 g/dL (ref 6.0–8.3)

## 2012-01-30 LAB — CBC WITH DIFFERENTIAL/PLATELET
Basophils Relative: 0.6 % (ref 0.0–3.0)
Eosinophils Absolute: 0 10*3/uL (ref 0.0–0.7)
Eosinophils Relative: 0 % (ref 0.0–5.0)
Hemoglobin: 14.4 g/dL (ref 12.0–15.0)
Lymphocytes Relative: 26.4 % (ref 12.0–46.0)
MCHC: 34 g/dL (ref 30.0–36.0)
Monocytes Relative: 5.4 % (ref 3.0–12.0)
Neutro Abs: 4.7 10*3/uL (ref 1.4–7.7)
Neutrophils Relative %: 67.6 % (ref 43.0–77.0)
RBC: 4.45 Mil/uL (ref 3.87–5.11)
WBC: 7 10*3/uL (ref 4.5–10.5)

## 2012-01-30 LAB — LIPID PANEL
Cholesterol: 157 mg/dL (ref 0–200)
LDL Cholesterol: 77 mg/dL (ref 0–99)
Total CHOL/HDL Ratio: 3
Triglycerides: 152 mg/dL — ABNORMAL HIGH (ref 0.0–149.0)

## 2012-01-30 MED ORDER — FLUCONAZOLE 100 MG PO TABS: 100.0000 mg | ORAL_TABLET | Freq: Every day | ORAL | Status: AC

## 2012-01-30 NOTE — Patient Instructions (Addendum)
Dry mouth - previous labs, most recently '11, revealed normal blood sugar and thyroid function. Will recheck ( a quarter says the thyroid will be normal) with recommendations to follow.  Cholesterol - last checked in '11. You should have cholesterol checked every year. Will check today with recommendations to follow.  Dermatology: there is no evidence of medically significant fungal infection of toes or hands. Please do not "cut away" material that builds up around the nail bed. White nails are not pathologic. The rash on the sides may be a fungal infection, such as Tinea Versicolor. Plan - try using selsun shampoo 2.5% as a body wash for 5 days in a row to see if this makes a difference. The rash you describe in the groin and inner thighs sounds like a yeast infection. Do not use clobetasol cream in this area. Take diflucan 100 mg once a day for 10 days.   Bladder - the burning may be a problem called atrophic vaginitis. At your next gyn exam be sure to mention your symptoms to the gynecologist. The treatment is to use vaginal hormonal creams that can relieve vaginal itching, dryness and burning with urination. Will check a urinalysis today.   Rash on cheek - there is redness over both cheeks. The appearance is suggestive of Rosacea. If you wish treatment we can prescribe metronidazole gel to use when the rash flares.   Atrophic Vaginitis Atrophic vaginitis is a problem of low levels of estrogen in women. This problem can happen at any age. It is most common in women who have gone through menopause ("the change").   HOW WILL I KNOW IF I HAVE THIS PROBLEM? You may have:  Trouble with peeing (urinating), such as:   Going to the bathroom often.   A hard time holding your pee until you reach a bathroom.   Leaking pee.   Having pain when you pee.   Itching or a burning feeling.   Vaginal bleeding and spotting.   Pain during sex.   Dryness of the vagina.   A yellow, bad-smelling fluid  (discharge) coming from the vagina.  HOW WILL MY DOCTOR CHECK FOR THIS PROBLEM?  During your exam, your doctor will likely find the problem.   If there is a vaginal fluid, it may be checked for infection.  HOW WILL THIS PROBLEM BE TREATED? Keep the vulvar skin as clean as possible. Moisturizers and lubricants can help with some of the symptoms. Estrogen replacement can help. There are 2 ways to take estrogen:  Systemic estrogen gets estrogen to your whole body. It takes many weeks or months before the symptoms get better.   You take an estrogen pill.   You use a skin patch. This is a patch that you put on your skin.   If you still have your uterus, your doctor may ask you to take a hormone. Talk to your doctor about the right medicine for you.   Estrogen cream.  This puts estrogen only at the part of your body where you apply it. The cream is put into the vagina or put on the vulvar skin. For some women, estrogen cream works faster than pills or the patch.  CAN ALL WOMEN WITH THIS PROBLEM USE ESTROGEN? No. Women with certain types of cancer, liver problems, or problems with blood clots should not take estrogen. Your doctor can help you decide the best treatment for your symptoms. Document Released: 01/14/2008 Document Revised: 07/17/2011 Document Reviewed: 01/14/2008 [facility] Patient Information 2012 [place],  [place]  Rosacea Rosacea is similar to acne, with red bumps and pimples appearing around the face. The cause is unknown. It is not an infection. It is often made worse by drinking alcohol, especially red wine, or eating hot or spicy foods. Eating chocolate, nuts, or cheese may also make it worse. It can be severe in some cases and eventually result in a bright, red, swollen nose. Rosacea tends to come and go, and cannot usually be completely cured. Sometimes it remains dormant for many years, and then recurs.   Treatment can be very effective, however, in clearing the rash and  preventing the problem. Drug treatment may include topical medicine or an oral antibiotic. Normally 1 to 2 months of treatment is needed to make rosacea go away. Low dose oral antibiotics or topicals may be needed long term for prevention. Call your doctor for a follow up exam in 1 to 2 months, unless your rash worsens and you need more immediate care. Document Released: 09/04/2004 Document Revised: 07/17/2011 Document Reviewed: 07/08/2011 [facility] Patient Information 2012 [place], [place].

## 2012-01-30 NOTE — Progress Notes (Signed)
Subjective:    Patient ID: [name], female    DOB: 05-06-1936, 76 y.o.   MRN: [id number]  HPI [name] has been told by her dentist that [name] has excessive dryness in her mouth as well as some "grooving" of the tongue. [name] is concerned about diabetes or thryroid disease. The latter runs in her family. Chart reviewed" '10 and '11 - normal TSH, normal serum glucose.  [name] is concerned about possible nail fungus, toes and fingers. [name] has a rash on her left cheek but does admit to redness at the glabella as well as the hair line.  [name] reports dysuria but describes this as external burning. [name] does have vaginal dryness and itching. [name] has been using clobetasol cream in the vulvar area and describes an erythematous macular rash in the groin and vulva and there are small satellite lesions  Past Medical History  Diagnosis Date  . Colon polyps   . Depression   . GERD (gastroesophageal reflux disease)   . Osteoarthritis   . Hx: UTI (urinary tract infection)   . Stress incontinence, female   . Hypertension   . Whooping cough    Past Surgical History  Procedure Date  . Bladder repair   . Mohs surgery     procedure to remove basal cell  . Vertebroplasty secondary to traumatic compression fracture   . Cholecystectomy   . Abdominal hysterectomy   . Tonsillectomy and adenoidectomy   . Appendectomy    No family history on file. History   Social History  . Marital Status: Married    Spouse Name: N/A    Number of Children: N/A  . Years of Education: N/A   Occupational History  . Not on file.   Social History Main Topics  . Smoking status: Former Games developer  . Smokeless tobacco: Not on file  . Alcohol Use: Not on file  . Drug Use: Not on file  . Sexually Active: Not on file   Other Topics Concern  . Not on file   Social History Narrative   HSG, attended [employer]Married '681 son '66, 1 Daughter '68; 5 grandchildrenWork: retired Location manager in good healthFormer  smokerMrs. Booker expresses sadness due to ongoing divorce proceedings involving her son (and her 3 grandchildren). They live in [place] and the son and wife are still living together, but there has been limited visiting opportunity. [name] became tearful, but acknowledges that [name] feels [name] has enough support at home to discuss problems and is able to speak with her son about them. Patient endorses that her prior history of depression is being well managed with effexor, but was interested  In an additional RX (Xanax) for more acute management of these current stressors. 08/10    Current Outpatient Prescriptions on File Prior to Visit  Medication Sig Dispense Refill  . aspirin 81 MG tablet Take 81 mg by mouth daily.        Marland Kitchen ezetimibe-simvastatin (VYTORIN) 10-20 MG per tablet Take 1 tablet by mouth at bedtime.  90 tablet  3  . nadolol (CORGARD) 40 MG tablet TAKE 1 TABLET AT BEDTIME  90 tablet  0  . venlafaxine (EFFEXOR-XR) 150 MG 24 hr capsule Take 150 mg by mouth 2 (two) times daily.        Marland Kitchen zolpidem (AMBIEN) 5 MG tablet Take 5 mg by mouth at bedtime as needed.        . Desoximetasone (TOPICORT) 0.25 % ointment Apply 1 mg topically 2 (two) times  daily. Apply a wee dab of ointment with a q-tip to the small nodule under the tonque twice a day.  15 g  1  . Multiple Vitamins-Minerals (ICAPS) CAPS Take by mouth.        Marland Kitchen omeprazole (PRILOSEC) 20 MG capsule Take 20 mg by mouth daily.            Review of Systems System review is negative for any constitutional, cardiac, pulmonary, GI or neuro symptoms or complaints other than as described in the HPI.     Objective:   Physical Exam Filed Vitals:   01/30/12 1409  BP: 130/80  Pulse: 68  Temp: 97.6 F (36.4 C)  Resp: 16   Wt Readings from Last 3 Encounters:  01/30/12 174 lb (78.926 kg)  12/05/10 176 lb 8 oz (80.06 kg)  04/29/10 176 lb (79.833 kg)   Gen'l- overweight white woman in no distress HEENT - [place]/AT, C&S clear, PERRLA Neck  - supple Cor- 2+ radial pulse, RRR PUlm - normal respirations Derm - minmal nail changes but no evidence of medically significant onychomycosis. Erythematous rash malar regions with some papular lesions right side.  Lab Results  Component Value Date   WBC 7.0 01/30/2012   HGB 14.4 01/30/2012   HCT 42.3 01/30/2012   PLT 210.0 01/30/2012   GLUCOSE 84 01/30/2012   CHOL 157 01/30/2012   TRIG 152.0* 01/30/2012   HDL 49.50 01/30/2012   LDLDIRECT 124.5 04/29/2010   LDLCALC 77 01/30/2012        ALT 22 01/30/2012   AST 21 01/30/2012        NA 141 01/30/2012   K 4.7 01/30/2012   CL 104 01/30/2012   CREATININE 0.7 01/30/2012   BUN 16 01/30/2012   CO2 31 01/30/2012   TSH 3.71 01/30/2012   Urinalysis negative      Assessment & Plan:  Derm - no evidence of significant onychomycosis that requires any treatment. [name] may have Rosacea - this can be addressed by her dermatologist or if it flare medication can be initiated, ie. Metrogel. By her report and description of appearance of rash in the vulvar and inner thigh area this may be candidiasis. Plan - a course of diflucan.  Gyn- patient has vaginal dryness and external burning with urination that strongly suggests atrophic vaginitis. [name] is encouraged to discuss this with her gynecologist and to discuss possible treatment.

## 2012-02-01 NOTE — Assessment & Plan Note (Signed)
BP Readings from Last 3 Encounters:  01/30/12 130/80  12/05/10 102/62  04/29/10 128/80   Good control. Kidney function and electrolytes are normal.  Plan Continue present medications.

## 2012-02-01 NOTE — Assessment & Plan Note (Signed)
Lipid panel reveals good control with LDL better than goal and HDL also better than goal.  Plan-  Continue present regimen

## 2012-04-04 ENCOUNTER — Emergency Department (HOSPITAL_COMMUNITY)
Admission: EM | Admit: 2012-04-04 | Discharge: 2012-04-04 | Disposition: A | Discharge status: Home / Self Care | Payer: Medicare Other | Attending: Emergency Medicine | Admitting: Emergency Medicine

## 2012-04-04 ENCOUNTER — Encounter (HOSPITAL_COMMUNITY): Payer: Self-pay | Admitting: *Deleted

## 2012-04-04 ENCOUNTER — Emergency Department (HOSPITAL_COMMUNITY): Payer: Medicare Other

## 2012-04-04 DIAGNOSIS — I10 Essential (primary) hypertension: Secondary | ICD-10-CM | POA: Insufficient documentation

## 2012-04-04 DIAGNOSIS — R51 Headache: Secondary | ICD-10-CM | POA: Insufficient documentation

## 2012-04-04 DIAGNOSIS — Y92009 Unspecified place in unspecified non-institutional (private) residence as the place of occurrence of the external cause: Secondary | ICD-10-CM | POA: Insufficient documentation

## 2012-04-04 DIAGNOSIS — M5030 Other cervical disc degeneration, unspecified cervical region: Secondary | ICD-10-CM | POA: Insufficient documentation

## 2012-04-04 DIAGNOSIS — W1809XA Striking against other object with subsequent fall, initial encounter: Secondary | ICD-10-CM | POA: Insufficient documentation

## 2012-04-04 DIAGNOSIS — M542 Cervicalgia: Secondary | ICD-10-CM | POA: Insufficient documentation

## 2012-04-04 HISTORY — DX: Unspecified macular degeneration: H35.30

## 2012-04-04 NOTE — ED Provider Notes (Signed)
I took this patient over for Dr. Weldon Inches while waiting for results from CT - both CT scans of c-spine and head were non acute.  The patient had no further complaints on my interview, is pain free, is not nauseous and was asking to go home.  A neurologic exam was non-focal.  Pt will be DC to home stable, in good condition.  4:25 PM Tannor Pyon, Imogene Burn, MD 04/04/12 1625

## 2012-04-04 NOTE — ED Notes (Signed)
Patient transported to CT 

## 2012-04-04 NOTE — ED Notes (Addendum)
Pt states she was walking her dog and fell backward on deck hitting back of head, no laceration

## 2012-04-04 NOTE — ED Notes (Signed)
Per patient fell and hit back of head this am-states she tripped, was not dizzy-did not loose consciousness-vision WNL-c/o head and neck pain

## 2012-04-04 NOTE — ED Notes (Signed)
Off floor for testing 

## 2012-04-04 NOTE — ED Provider Notes (Signed)
History     CSN: [id number]  Arrival date & time 04/04/12  1129   First MD Initiated Contact with Patient 04/04/12 1412      Chief Complaint  Patient presents with  . Head Injury    (Consider location/radiation/quality/duration/timing/severity/associated sxs/prior treatment) The history is provided by the patient and the spouse.  She c/o neck pain and mild ha after she fell this am and struck the back of her head.   She was letting her dog out to go to the bathroom.  She tripped and fell backward and struck her occiput on the wooden stairs of her deck.  She was dazed.  She denies loc.  She had nausea and dizziness but those sxs are gone now.  She denies vision changes. She denies weakness or paresthesias.  She denies any other injuries.   She takes asa but no other anticoagulants.    Past Medical History  Diagnosis Date  . Colon polyps   . Depression   . GERD (gastroesophageal reflux disease)   . Osteoarthritis   . Hx: UTI (urinary tract infection)   . Stress incontinence, female   . Hypertension   . Whooping cough   . Macular degeneration     Past Surgical History  Procedure Date  . Bladder repair     x two  . Mohs surgery     procedure to remove basal cell  . Vertebroplasty secondary to traumatic compression fracture   . Cholecystectomy   . Abdominal hysterectomy   . Tonsillectomy and adenoidectomy   . Appendectomy     No family history on file.  History  Substance Use Topics  . Smoking status: Former Games developer  . Smokeless tobacco: Not on file  . Alcohol Use: No    OB History    Grav Para Term Preterm Abortions TAB SAB Ect Mult Living                  Review of Systems  HENT: Positive for neck pain.   Eyes: Negative for visual disturbance.  Gastrointestinal: Negative for nausea and vomiting.  Musculoskeletal: Negative for back pain.  Skin: Negative for wound.  Neurological: Positive for headaches. Negative for weakness and numbness.  Hematological:  Does not bruise/bleed easily.  Psychiatric/Behavioral: Negative for confusion.  All other systems reviewed and are negative.    Allergies  Codeine  Home Medications   Current Outpatient Rx  Name Route Sig Dispense Refill  . EZETIMIBE-SIMVASTATIN 10-20 MG PO TABS Oral Take 1 tablet by mouth at bedtime. 90 tablet 3  . ICAPS PO CAPS Oral Take by mouth.      Marland Kitchen NADOLOL 40 MG PO TABS  TAKE 1 TABLET AT BEDTIME 90 tablet 0    PATIENT NEEDS TO MAKE YEARLY APPT. WITH DR. Debby Bud ...  . OMEPRAZOLE 20 MG PO CPDR Oral Take 20 mg by mouth daily.      . VENLAFAXINE HCL ER 150 MG PO CP24 Oral Take 150 mg by mouth 2 (two) times daily.      Marland Kitchen ZOLPIDEM TARTRATE 5 MG PO TABS Oral Take 5 mg by mouth at bedtime as needed.     . ASPIRIN 81 MG PO TABS Oral Take 81 mg by mouth daily.     . DESOXIMETASONE 0.25 % EX OINT Topical Apply 1 mg topically 2 (two) times daily. Apply a wee dab of ointment with a q-tip to the small nodule under the tonque twice a day. 15 g 1  BP 95/78  Pulse 62  Temp 97.6 F (36.4 C) (Oral)  Resp 18  Wt 175 lb (79.379 kg)  SpO2 96%  Physical Exam  Nursing note and vitals reviewed. Constitutional: She is oriented to person, place, and time. She appears well-developed and well-nourished. No distress.  HENT:  Head: Normocephalic and atraumatic.       + occipital ttp with no edema or bleeding  Eyes: Conjunctivae are normal. Pupils are equal, round, and reactive to light.  Neck: Normal range of motion. Neck supple.       Mild ttp of cspine  Pulmonary/Chest: Effort normal.  Abdominal: She exhibits no distension.  Musculoskeletal: Normal range of motion.       Mild tspine ttp  Neurological: She is alert and oriented to person, place, and time.  Skin: Skin is warm and dry.  Psychiatric: She has a normal mood and affect. Thought content normal.    ED Course  Procedures (including critical care time) Neck pain and ha after fall  With strike to occiput. Nl ms. Nl neuro exam.   Mild ttp of neck and occiput. No anticoagulants.  Will do ct of head and neck She does not want pain meds now.   Labs Reviewed - No data to display No results found.   No diagnosis found.    MDM  Neck pain Occipital ha after fall.        Cheri Guppy, MD 04/04/12 [phone]

## 2012-04-08 ENCOUNTER — Other Ambulatory Visit: Payer: Self-pay | Admitting: Internal Medicine

## 2012-04-20 ENCOUNTER — Encounter: Payer: Self-pay | Admitting: Internal Medicine

## 2012-04-20 ENCOUNTER — Ambulatory Visit (INDEPENDENT_AMBULATORY_CARE_PROVIDER_SITE_OTHER): Payer: Medicare Other | Admitting: Internal Medicine

## 2012-04-20 VITALS — BP 132/80 | HR 86 | Temp 97.1°F | Resp 16 | Wt 176.0 lb

## 2012-04-20 DIAGNOSIS — R071 Chest pain on breathing: Secondary | ICD-10-CM

## 2012-04-20 DIAGNOSIS — I10 Essential (primary) hypertension: Secondary | ICD-10-CM

## 2012-04-20 DIAGNOSIS — R0789 Other chest pain: Secondary | ICD-10-CM

## 2012-04-20 NOTE — Patient Instructions (Addendum)
Blood pressure - no problem so far with taking the nadolol and the eye drops. You can tell if there is a problem by checking your heart rate. If it dips below 60 at rest you need to call  Chest wall pain - no evidence of fracture on exam. You are bruised. Try a lineament/rub of choice over the sore area of the collar bone and sternum.  26 cents well earned.

## 2012-04-20 NOTE — Progress Notes (Signed)
Subjective:    Patient ID: [name], female    DOB: 10-28-1935, 76 y.o.   MRN: [id number]  HPI [name] was recently started on a beta-blocker eye drop for glaucoma and presents to be sure that this is not making her over "beta-blocked" since she takes naldolol. She feels fine, has no fatigue and has not noticed SOB/DOE or slow heart rate.  She took a fall - landed on her right scapular area and struck the back of her head. ED records reviewed: normal exam and normal CT head and neck. She is still having pain at the right scapular area and at the clavicular-manubrium junction.  Past Medical History  Diagnosis Date  . Colon polyps   . Depression   . GERD (gastroesophageal reflux disease)   . Osteoarthritis   . Hx: UTI (urinary tract infection)   . Stress incontinence, female   . Hypertension   . Whooping cough   . Macular degeneration    Past Surgical History  Procedure Date  . Bladder repair     x two  . Mohs surgery     procedure to remove basal cell  . Vertebroplasty secondary to traumatic compression fracture   . Cholecystectomy   . Abdominal hysterectomy   . Tonsillectomy and adenoidectomy   . Appendectomy    No family history on file. History   Social History  . Marital Status: Married    Spouse Name: N/A    Number of Children: N/A  . Years of Education: N/A   Occupational History  . Not on file.   Social History Main Topics  . Smoking status: Former Games developer  . Smokeless tobacco: Not on file  . Alcohol Use: No  . Drug Use: No  . Sexually Active: Not on file   Other Topics Concern  . 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 Farnes expresses sadness due to ongoing divorce proceedings involving her son (and her 3 grandchildren). They live in [place] and the son and wife are still living together, but there  has been limited visiting opportunity. [name] became tearful, but acknowledges that she feels she has enough support at home to discuss problems and is able to speak with her son about them. Patient endorses that her prior history of depression is being well managed with effexor, but was interested  In an additional RX (Xanax) for more acute management of these current stressors. 08/10    Current Outpatient Prescriptions on File Prior to Visit  Medication Sig Dispense Refill  . aspirin 81 MG tablet Take 81 mg by mouth daily.       . nadolol (CORGARD) 40 MG tablet TAKE 1 TABLET AT BEDTIME  90 tablet  0  . venlafaxine (EFFEXOR-XR) 150 MG 24 hr capsule Take 150 mg by mouth 2 (two) times daily.        Marland Kitchen VYTORIN 10-20 MG per tablet TAKE 1 TABLET AT BEDTIME  90 tablet  2  . zolpidem (AMBIEN) 5 MG tablet Take 5 mg by mouth at bedtime as needed.       . Desoximetasone (TOPICORT) 0.25 % ointment Apply 1 mg topically 2 (two) times daily. Apply a wee dab of ointment with a q-tip to the small nodule under the tonque twice a day.  15 g  1  . ezetimibe-simvastatin (VYTORIN) 10-20 MG per tablet Take 1 tablet  by mouth at bedtime.  90 tablet  3  . Multiple Vitamins-Minerals (ICAPS) CAPS Take by mouth.        Marland Kitchen omeprazole (PRILOSEC) 20 MG capsule Take 20 mg by mouth daily.            Review of Systems System review is negative for any constitutional, cardiac, pulmonary, GI or neuro symptoms or complaints other than as described in the HPI.     Objective:   Physical Exam Filed Vitals:   04/20/12 1422  BP: 132/80  Pulse: 86  Temp: 97.1 F (36.2 C)  Resp: 16   Gen'l - overweight white woman in no acute distress HEENT- no sign of trauma Neck- supple  Chest - tender at the area of scapula right at the medial edge. Tender at the clavicular-manubrium junction right.        Assessment & Plan:  Chest wall pain - probable strain to clavicular-manubrium from fall. No evidence of fracture of  clavicle. No indication for x-ray  Plan Lineament of choice.

## 2012-04-21 NOTE — Assessment & Plan Note (Signed)
Beta-blocker drops have not affected Heart rate or BP  Plan  Continue present medication - Nadolol.

## 2012-04-23 ENCOUNTER — Other Ambulatory Visit: Payer: Self-pay | Admitting: Internal Medicine

## 2012-07-22 ENCOUNTER — Other Ambulatory Visit: Payer: Self-pay | Admitting: Internal Medicine

## 2012-09-22 ENCOUNTER — Other Ambulatory Visit: Payer: Self-pay | Admitting: Internal Medicine

## 2012-09-24 ENCOUNTER — Other Ambulatory Visit: Payer: Self-pay | Admitting: Internal Medicine

## 2012-09-24 MED ORDER — NADOLOL 40 MG PO TABS: ORAL_TABLET | ORAL | Status: DC

## 2012-10-14 ENCOUNTER — Other Ambulatory Visit: Payer: Self-pay | Admitting: Internal Medicine

## 2012-11-20 ENCOUNTER — Other Ambulatory Visit: Payer: Self-pay | Admitting: Internal Medicine

## 2012-12-21 ENCOUNTER — Other Ambulatory Visit: Payer: Self-pay

## 2012-12-21 DIAGNOSIS — M549 Dorsalgia, unspecified: Secondary | ICD-10-CM

## 2012-12-21 MED ORDER — NADOLOL 40 MG PO TABS: 40.0000 mg | ORAL_TABLET | Freq: Every day | ORAL | Status: DC

## 2013-02-14 ENCOUNTER — Other Ambulatory Visit: Payer: Self-pay | Admitting: Internal Medicine

## 2013-02-22 ENCOUNTER — Other Ambulatory Visit: Payer: Self-pay | Admitting: *Deleted

## 2013-02-22 MED ORDER — CLOBETASOL PROPIONATE 0.05 % EX OINT: TOPICAL_OINTMENT | Freq: Two times a day (BID) | CUTANEOUS | Status: DC

## 2013-02-22 NOTE — Telephone Encounter (Signed)
Chart in your door.  

## 2013-02-22 NOTE — Telephone Encounter (Signed)
Fax From: [facility] Last Refilled: 03/05/12 #30/1 rf Last Aex: 08/20/12 no rx was given Please advise.

## 2013-03-03 ENCOUNTER — Encounter: Payer: Self-pay | Admitting: Internal Medicine

## 2013-03-03 ENCOUNTER — Other Ambulatory Visit (INDEPENDENT_AMBULATORY_CARE_PROVIDER_SITE_OTHER): Payer: 59

## 2013-03-03 ENCOUNTER — Ambulatory Visit (INDEPENDENT_AMBULATORY_CARE_PROVIDER_SITE_OTHER): Payer: Medicare Other | Admitting: Internal Medicine

## 2013-03-03 VITALS — BP 140/88 | HR 64 | Temp 97.6°F | Ht 62.0 in | Wt 174.0 lb

## 2013-03-03 DIAGNOSIS — H532 Diplopia: Secondary | ICD-10-CM

## 2013-03-03 DIAGNOSIS — E785 Hyperlipidemia, unspecified: Secondary | ICD-10-CM

## 2013-03-03 DIAGNOSIS — R4189 Other symptoms and signs involving cognitive functions and awareness: Secondary | ICD-10-CM

## 2013-03-03 DIAGNOSIS — Z0000 Encounter for general adult medical examination without abnormal findings: Secondary | ICD-10-CM

## 2013-03-03 DIAGNOSIS — M1990 Unspecified osteoarthritis, unspecified site: Secondary | ICD-10-CM

## 2013-03-03 DIAGNOSIS — I10 Essential (primary) hypertension: Secondary | ICD-10-CM

## 2013-03-03 DIAGNOSIS — H3530 Unspecified macular degeneration: Secondary | ICD-10-CM

## 2013-03-03 DIAGNOSIS — K219 Gastro-esophageal reflux disease without esophagitis: Secondary | ICD-10-CM

## 2013-03-03 DIAGNOSIS — H409 Unspecified glaucoma: Secondary | ICD-10-CM

## 2013-03-03 HISTORY — DX: Unspecified macular degeneration: H35.30

## 2013-03-03 LAB — CBC WITH DIFFERENTIAL/PLATELET
Basophils Absolute: 0 10*3/uL (ref 0.0–0.1)
Basophils Relative: 0.4 % (ref 0.0–3.0)
Eosinophils Absolute: 0 10*3/uL (ref 0.0–0.7)
HCT: 42.2 % (ref 36.0–46.0)
Hemoglobin: 14.4 g/dL (ref 12.0–15.0)
Lymphs Abs: 1.5 10*3/uL (ref 0.7–4.0)
MCHC: 34.2 g/dL (ref 30.0–36.0)
MCV: 96.3 fl (ref 78.0–100.0)
Neutro Abs: 5 10*3/uL (ref 1.4–7.7)
RBC: 4.39 Mil/uL (ref 3.87–5.11)
RDW: 13.7 % (ref 11.5–14.6)

## 2013-03-03 LAB — COMPREHENSIVE METABOLIC PANEL
Albumin: 4.1 g/dL (ref 3.5–5.2)
Alkaline Phosphatase: 69 U/L (ref 39–117)
BUN: 14 mg/dL (ref 6–23)
CO2: 28 mEq/L (ref 19–32)
Calcium: 9.4 mg/dL (ref 8.4–10.5)
Chloride: 105 mEq/L (ref 96–112)
GFR: 72.16 mL/min (ref >60.00)
Glucose, Bld: 91 mg/dL (ref 70–99)
Potassium: 4.6 mEq/L (ref 3.5–5.1)

## 2013-03-03 LAB — LIPID PANEL
Cholesterol: 164 mg/dL (ref 0–200)
Triglycerides: 196 mg/dL — ABNORMAL HIGH (ref 0.0–149.0)

## 2013-03-03 LAB — HEPATIC FUNCTION PANEL
Alkaline Phosphatase: 69 U/L (ref 39–117)
Bilirubin, Direct: 0.1 mg/dL (ref 0.0–0.3)
Total Bilirubin: 0.6 mg/dL (ref 0.3–1.2)
Total Protein: 7.2 g/dL (ref 6.0–8.3)

## 2013-03-03 NOTE — Progress Notes (Signed)
Subjective:    Patient ID: [name], female    DOB: Jul 16, 1936, 77 y.o.   MRN: [id number]  HPI The patient is here for annual Medicare wellness examination and management of other chronic and acute problems.  She has macular degeneration OD and glaucoma OS - seeing optometrist and is interested in referral.  She has had head and neck pain, and head pain at right occipital region: a sharp pain lancinating across the skull several times a day.She admits to word finding difficulty and getting her thoughts together, no nausea, admits to double vision, increasing coordination problems over the past several months.    The risk factors are reflected in the social history.  The roster of all physicians providing medical care to patient - is listed in the Snapshot section of the chart.  Activities of daily living:  The patient is 100% inedpendent in all ADLs: dressing, toileting, feeding as well as independent mobility  Home safety : The patient has smoke detectors in the home. Fall - had a bad fall in August with head injury. They wear seatbelts. No firearms at home. There is no violence in the home.   There is no risks for hepatitis, STDs or HIV. There is no history of blood transfusion. They have no travel history to infectious disease endemic areas of the world.  The patient has seen their dentist in the last six month. They have seen their eye doctor in the last year. They deny any hearing difficulty and have not had audiologic testing in the last year.    They do not  have excessive sun exposure. Discussed the need for sun protection: hats, long sleeves and use of sunscreen if there is significant sun exposure.   Diet: the importance of a healthy diet is discussed. They do have a healthy, but loves sweets diet.  The patient has no regular exercise program.  The benefits of regular aerobic exercise were discussed.  Depression screen: there are no signs or vegative symptoms of  depression- irritability, change in appetite, anhedonia, sadness/tearfullness.  Cognitive assessment: the patient manages all their financial and personal affairs and is actively engaged. They could relate day,date,year and events; recalled 3/3 objects at 3 minutes; performed clock-face test normally.  The following portions of the patient's history were reviewed and updated as appropriate: allergies, current medications, past family history, past medical history,  past surgical history, past social history  and problem list.  Vision, hearing, body mass index were assessed and reviewed.   During the course of the visit the patient was educated and counseled about appropriate screening and preventive services including : fall prevention , diabetes screening, nutrition counseling, colorectal cancer screening, and recommended immunizations.  Past Medical History  Diagnosis Date  . Colon polyps   . Depression   . GERD (gastroesophageal reflux disease)   . Osteoarthritis   . Hx: UTI (urinary tract infection)   . Stress incontinence, female   . Hypertension   . Whooping cough   . Macular degeneration    Past Surgical History  Procedure Laterality Date  . Bladder repair      x two  . Mohs surgery      procedure to remove basal cell  . Vertebroplasty secondary to traumatic compression fracture    . Cholecystectomy    . Abdominal hysterectomy    . Tonsillectomy and adenoidectomy    . Appendectomy     History reviewed. No pertinent family history. History   Social History  .  Marital Status: Married    Spouse Name: N/A    Number of Children: N/A  . Years of Education: N/A   Occupational History  . Not on file.   Social History Main Topics  . Smoking status: Former Games developer  . Smokeless tobacco: Not on file  . Alcohol Use: No  . Drug Use: No  . Sexually Active: Not on file   Other Topics Concern  . Not on file   Social History Narrative   HSG, attended [employer]    Married '68   1 son '66, 1 Daughter '68; 5 grandchildren   Work: retired Runner, broadcasting/film/video   Marriage in good health   Former smoker         [name] expresses sadness due to ongoing divorce proceedings involving her son (and her 3 grandchildren). They live in [place] and the son and wife are still living together, but there has been limited visiting opportunity. [name] became tearful, but acknowledges that she feels she has enough support at home to discuss problems and is able to speak with her son about them. Patient endorses that her prior history of depression is being well managed with effexor, but was interested  In an additional RX (Xanax) for more acute management of these current stressors. 08/10    Current Outpatient Prescriptions on File Prior to Visit  Medication Sig Dispense Refill  . aspirin 81 MG tablet Take 81 mg by mouth daily.       . clobetasol ointment (TEMOVATE) 0.05 % Apply topically 2 (two) times daily. For prn use no more than 5 days  30 g  0  . nadolol (CORGARD) 40 MG tablet Take 1 tablet (40 mg total) by mouth at bedtime.  30 tablet  5  . omeprazole (PRILOSEC) 20 MG capsule Take 20 mg by mouth daily.        Marland Kitchen venlafaxine (EFFEXOR-XR) 150 MG 24 hr capsule Take 150 mg by mouth 2 (two) times daily.        Marland Kitchen VYTORIN 10-20 MG per tablet TAKE 1 TABLET BY MOUTH ONCE DAILY  30 tablet  1  . zolpidem (AMBIEN) 5 MG tablet Take 5 mg by mouth at bedtime as needed.       . ezetimibe-simvastatin (VYTORIN) 10-20 MG per tablet Take 1 tablet by mouth at bedtime.  90 tablet  3   No current facility-administered medications on file prior to visit.      Review of Systems Constitutional:  Negative for fever, chills, activity change and unexpected weight change.  HEENT:  Negative for hearing loss, ear pain, congestion, neck stiffness and postnasal drip. Negative for sore throat or swallowing problems. Negative for dental complaints.   Eyes: Negative for vision loss or change in  visual acuity. Positive for occasional double vision. Respiratory: Negative for chest tightness and wheezing. Negative for DOE.   Cardiovascular: Negative for chest pain or palpitations. No decreased exercise tolerance Gastrointestinal: No change in bowel habit. No bloating or gas. No reflux or indigestion Genitourinary: Positive for urgency, frequency, incontinence despite surgery.   Musculoskeletal: Negative for myalgias, back pain, arthralgias and gait problem.  Neurological: Negative for dizziness, tremors, weakness and headaches. Positive for word finding difficulty, double vision, slowed mentation all after fall.  Hematological: Negative for adenopathy.  Psychiatric/Behavioral: Negative for behavioral problems and dysphoric mood.       Objective:   Physical Exam Filed Vitals:   03/03/13 0906  BP: 140/88  Pulse: 64  Temp: 97.6 F (  36.4 C)   Wt Readings from Last 3 Encounters:  03/03/13 174 lb (78.926 kg)  04/20/12 176 lb (79.833 kg)  04/04/12 175 lb (79.379 kg)   BP Readings from Last 3 Encounters:  03/03/13 140/88  04/20/12 132/80  04/04/12 129/54    Gen'l: well nourished, well developed, overweight white Woman in no distress HEENT - Gem/AT, EACs/TMs normal, oropharynx with native dentition in good condition, no buccal or palatal lesions, posterior pharynx clear, mucous membranes moist. C&S clear, PERRLA, fundi - normal Neck - supple, no thyromegaly Nodes- negative submental, cervical, supraclavicular regions Chest - no deformity, no CVAT Lungs - clear without rales, wheezes. No increased work of breathing Breast - deferred to gyn Cardiovascular - regular rate and rhythm, quiet precordium, no murmurs, rubs or gallops, 2+ radial, DP and PT pulses Abdomen - BS+ x 4, no HSM, no guarding or rebound or tenderness Pelvic - deferred to gyn Rectal - deferred to gyn Extremities - no clubbing, cyanosis, edema or deformity.  Neuro - A&O x 3, CN II-XII normal, motor strength  normal and equal, DTRs 2+ and symmetrical biceps, radial, and patellar tendons. Cerebellar - no tremor, no rigidity, fluid movement and normal gait. Normal finger to nose, heel-shin manuever Derm - Head, neck, back, abdomen and extremities without suspicious lesions  Recent Results (from the past 2160 hour(s))  HEPATIC FUNCTION PANEL     Status: None   Collection Time    03/03/13 10:01 AM      Result Value Range   Total Bilirubin 0.6  0.3 - 1.2 mg/dL   Bilirubin, Direct 0.1  0.0 - 0.3 mg/dL   Alkaline Phosphatase 69  39 - 117 U/L   AST 22  0 - 37 U/L   ALT 16  0 - 35 U/L   Total Protein 7.2  6.0 - 8.3 g/dL   Albumin 4.1  3.5 - 5.2 g/dL  TSH     Status: None   Collection Time    03/03/13 10:01 AM      Result Value Range   TSH 2.85  0.35 - 5.50 uIU/mL  COMPREHENSIVE METABOLIC PANEL     Status: None   Collection Time    03/03/13 10:01 AM      Result Value Range   Sodium 141  135 - 145 mEq/L   Potassium 4.6  3.5 - 5.1 mEq/L   Chloride 105  96 - 112 mEq/L   CO2 28  19 - 32 mEq/L   Glucose, Bld 91  70 - 99 mg/dL   BUN 14  6 - 23 mg/dL   Creatinine, Ser 0.8  0.4 - 1.2 mg/dL   Total Bilirubin 0.6  0.3 - 1.2 mg/dL   Alkaline Phosphatase 69  39 - 117 U/L   AST 22  0 - 37 U/L   ALT 16  0 - 35 U/L   Total Protein 7.2  6.0 - 8.3 g/dL   Albumin 4.1  3.5 - 5.2 g/dL   Calcium 9.4  8.4 - 16.1 mg/dL   GFR 09.60  >45.40 mL/min  LIPID PANEL     Status: Abnormal   Collection Time    03/03/13 10:01 AM      Result Value Range   Cholesterol 164  0 - 200 mg/dL   Comment: ATP III Classification       Desirable:  < 200 mg/dL               Borderline High:  200 - 239  mg/dL          High:  > = 161 mg/dL   Triglycerides 096.0 (*) 0.0 - 149.0 mg/dL   Comment: Normal:  <454 mg/dLBorderline High:  150 - 199 mg/dL   HDL 09.81  >19.14 mg/dL   VLDL 78.2  0.0 - 95.6 mg/dL   LDL Cholesterol 82  0 - 99 mg/dL   Total CHOL/HDL Ratio 4     Comment:                Men          Women1/2 Average Risk     3.4           3.3Average Risk          5.0          4.42X Average Risk          9.6          7.13X Average Risk          15.0          11.0                      CBC WITH DIFFERENTIAL     Status: None   Collection Time    03/03/13 10:01 AM      Result Value Range   WBC 6.9  4.5 - 10.5 K/uL   RBC 4.39  3.87 - 5.11 Mil/uL   Hemoglobin 14.4  12.0 - 15.0 g/dL   HCT 21.3  08.6 - 57.8 %   MCV 96.3  78.0 - 100.0 fl   MCHC 34.2  30.0 - 36.0 g/dL   RDW 46.9  62.9 - 52.8 %   Platelets 200.0  150.0 - 400.0 K/uL   Neutrophils Relative % 72.0  43.0 - 77.0 %   Lymphocytes Relative 21.9  12.0 - 46.0 %   Monocytes Relative 5.6  3.0 - 12.0 %   Eosinophils Relative 0.1  0.0 - 5.0 %   Basophils Relative 0.4  0.0 - 3.0 %   Neutro Abs 5.0  1.4 - 7.7 K/uL   Lymphs Abs 1.5  0.7 - 4.0 K/uL   Monocytes Absolute 0.4  0.1 - 1.0 K/uL   Eosinophils Absolute 0.0  0.0 - 0.7 K/uL   Basophils Absolute 0.0  0.0 - 0.1 K/uL  .         Assessment & Plan:  Fall and sequelae - she has had more falls with a significant fall last August. She has also had subtle neurologic changes including intermittent double vision, word finding difficulty, slowed mentation. Neuro exam is grossly normal.  PLan MRI brain to r/u occipital lesions, cerebellar lesions.

## 2013-03-03 NOTE — Patient Instructions (Addendum)
Thanks for coming in.  Concern for any brain injury with pain, double vision, word finding, etc.  Plan MRI brain  Eye problems - it is time to see an ophthalmologist - Dr. Charlotte Sanes at [facility] ophthalmology and she can refer you to a macular specialist.  General health: watch the weight (cut back on the sweets); your job is your health - regular aerobic exercise - 30 minutes/3 times a week with a heart rate of 110-120 and stretch flex exercise.  Lab today - results will be available on MyChart - please sign up.   Overall you seem to be in pretty good condition for the condition you are in.

## 2013-03-06 DIAGNOSIS — Z0000 Encounter for general adult medical examination without abnormal findings: Secondary | ICD-10-CM | POA: Insufficient documentation

## 2013-03-06 NOTE — Assessment & Plan Note (Signed)
No severe joint inflammation that limits her daily activities.   Plan Symptomatic care.

## 2013-03-06 NOTE — Assessment & Plan Note (Signed)
Have recommended consultation with ophthalmologist - referred to Dr. Charlotte Sanes

## 2013-03-06 NOTE — Assessment & Plan Note (Signed)
Taking and tolerating medication. LDL is better than goal of 100 or less. HDL is at goal of 40+. Liver functions are normal  Plan Continue present regimen

## 2013-03-06 NOTE — Assessment & Plan Note (Addendum)
Interval history w/o major medical issues. Limited physical exam is normal deferring breast and pelvic to gyn. She is current with colorectal cancer screening. She reports being current with mammography - no reports in EPIC. Immunizations are  Up to date.  In summary - a very nice woman who appears to be medically stable. Recommended weight control: Diet management: smart food choices, PORTION SIZE CONTROL, regular exercise. Goal - to loose 1-2 lbs.month. Target weight - 155. Recommended a regular exercise program.

## 2013-03-06 NOTE — Assessment & Plan Note (Signed)
Symptoms controlled with the use of PPI medication. No swallowing problems.   Plan Continue prn PPI.

## 2013-03-06 NOTE — Assessment & Plan Note (Signed)
BP Readings from Last 3 Encounters:  03/03/13 140/88  04/20/12 132/80  04/04/12 129/54   Adequate control on present regimen

## 2013-03-07 ENCOUNTER — Ambulatory Visit
Admission: RE | Admit: 2013-03-07 | Discharge: 2013-03-07 | Disposition: A | Discharge status: Home / Self Care | Payer: 59 | Source: Ambulatory Visit | Attending: Internal Medicine | Admitting: Internal Medicine

## 2013-03-07 DIAGNOSIS — R4189 Other symptoms and signs involving cognitive functions and awareness: Secondary | ICD-10-CM

## 2013-03-07 DIAGNOSIS — H532 Diplopia: Secondary | ICD-10-CM

## 2013-03-31 ENCOUNTER — Telehealth: Payer: Self-pay | Admitting: *Deleted

## 2013-03-31 MED ORDER — MENTHOL (TOPICAL ANALGESIC) 4 % EX GEL: CUTANEOUS | Status: DC

## 2013-03-31 NOTE — Telephone Encounter (Signed)
Husband call again left msg stating will need prescription for biofreeze will be leaving going out of town in the morning. Called pt back inform her she can buy over the counter she states not the roll on. Pharmacy will not give it to her without a rx. Inform her md did ok will send to pharmacy. Wanting rx sent to [facility]...Raechel Chute

## 2013-03-31 NOTE — Telephone Encounter (Signed)
Pt called requesting Biofreeze for her back.  States they will be traveling and she needs it.  Please advise

## 2013-03-31 NOTE — Telephone Encounter (Signed)
OK - did not know it was prescription

## 2013-04-08 ENCOUNTER — Other Ambulatory Visit: Payer: Self-pay | Admitting: Internal Medicine

## 2013-05-30 ENCOUNTER — Telehealth: Payer: Self-pay | Admitting: Obstetrics & Gynecology

## 2013-05-30 NOTE — Telephone Encounter (Signed)
Patient has a lump on her bottom on the right side. She said it is getting bigger. In order for her insurance to cover a visit it has to be an emergency visit and she is not due for her aex in January. She would like to know if it would be best if she came in now or if she could wait until January. "The lump is not painful but its like a ball"

## 2013-05-30 NOTE — Telephone Encounter (Signed)
Patient has hx of sebaceous cyst on R Vulva.  States that it is getting larger. No fevers or pain at site. Advised that since cyst is getting larger, she should be seen. Advised to call her insurance carrier if she has questions regarding if visit would be covered. Patient declines earlier office visit, states she will be in town later this week. Office visit scheduled at patients choice of day/time.  Advised patient to call back if worsens or develops fevers or any new symptoms. Patient agreeable.   Routing to provider for signature.  Will close encounter.

## 2013-06-06 ENCOUNTER — Encounter: Payer: Self-pay | Admitting: Obstetrics & Gynecology

## 2013-06-06 ENCOUNTER — Ambulatory Visit (INDEPENDENT_AMBULATORY_CARE_PROVIDER_SITE_OTHER): Payer: 59 | Admitting: Obstetrics & Gynecology

## 2013-06-06 VITALS — BP 124/80 | HR 64 | Resp 16 | Ht 61.5 in | Wt 173.4 lb

## 2013-06-06 DIAGNOSIS — N9089 Other specified noninflammatory disorders of vulva and perineum: Secondary | ICD-10-CM

## 2013-06-06 NOTE — Patient Instructions (Signed)
Kennon Rounds will call to schedule your procedure

## 2013-06-06 NOTE — Progress Notes (Signed)
Subjective:     Patient ID: [name], female   DOB: 02-23-1936, 77 y.o.   MRN: [id number]  HPI 77 yo G4P2 MWF here for complaint of enlarging area on labia.  Non tender.  No vaginal bleeding or discharge.  No pain.  Long hx of urinary incontinence.  Feels like lesion has gotten much bigger in the last year.  I haven't seen patient in about 2 years and my two notes do no have any comments about vulvar lesion.  Patient unsure exactly how long it has been present but she feels at least a year.  Review of Systems  All other systems reviewed and are negative.       Objective:   Physical Exam  Constitutional: She is oriented to person, place, and time. She appears well-developed and well-nourished.  Genitourinary: Vagina normal.     Lymphadenopathy:       Right: No inguinal adenopathy present.       Left: No inguinal adenopathy present.  Neurological: She is alert and oriented to person, place, and time.  Skin: Skin is warm.  Psychiatric: She has a normal mood and affect.       Assessment:     Mobile lesion in right labia, possible lipoma     Plan:     Recommend excision in OR due to size and possible bleeding.  Procedure described.  Risks of bleeding, infection, need for future procedure if pathology is abnormal, anesthesia risks, DVT.  Pt knows I am not 100% sure of what this is and that I will sent tissue to pathology.  Outpatient nature of procedure discussed.  Will get scheduled at pt's convenience.  ~25 minutes spent with patient >50% of time was in face to face discussion of above.

## 2013-06-08 ENCOUNTER — Other Ambulatory Visit: Payer: Self-pay | Admitting: Certified Nurse Midwife

## 2013-06-09 ENCOUNTER — Encounter: Payer: Self-pay | Admitting: Obstetrics & Gynecology

## 2013-06-09 ENCOUNTER — Telehealth: Payer: Self-pay | Admitting: Certified Nurse Midwife

## 2013-06-09 NOTE — Telephone Encounter (Signed)
Please advise if ok to refill. I left message for patient to call back and see if she needs this. She was recently seen for vulvar area and had AEX 1/14.

## 2013-06-09 NOTE — Telephone Encounter (Signed)
Needs outpt excision of right labium 3cm lesion.  Thanks.

## 2013-06-09 NOTE — Telephone Encounter (Signed)
Patient is calling about scheduling surgery.

## 2013-06-09 NOTE — Telephone Encounter (Signed)
Call to patient who is checking to see what dates Dr Hyacinth Meeker available for her surgery.  She can take anything except December 4 and 11 due to eye surgery. Advised will check on surgery and call her back.

## 2013-06-16 ENCOUNTER — Other Ambulatory Visit: Payer: Self-pay

## 2013-06-16 NOTE — Telephone Encounter (Signed)
That is totally fine.  She will need sutures removed and that is a good amount of time to wait.

## 2013-06-16 NOTE — Telephone Encounter (Signed)
Patient notified of surgery date of Tuesday 07-05-13 at 0930 at [facility]. Surgery insructions reviewed with patient and written copy mailed. Pre/post op appointments scheduled.  Patient will be traveling following procedure so not available for post op till 07-14-13.  Is this ok?

## 2013-06-20 NOTE — Telephone Encounter (Signed)
I called patietn back last week and left VM that dr Hyacinth Meeker was agreeable to this post op appt schedule and call back if needed.

## 2013-06-23 ENCOUNTER — Telehealth: Payer: Self-pay | Admitting: Obstetrics & Gynecology

## 2013-06-23 ENCOUNTER — Encounter (HOSPITAL_COMMUNITY)
Admission: RE | Admit: 2013-06-23 | Discharge: 2013-06-23 | Disposition: A | Discharge status: Home / Self Care | Payer: Medicare Other | Source: Ambulatory Visit | Attending: Obstetrics & Gynecology | Admitting: Obstetrics & Gynecology

## 2013-06-23 ENCOUNTER — Encounter (HOSPITAL_COMMUNITY): Payer: Self-pay | Admitting: Pharmacist

## 2013-06-23 ENCOUNTER — Encounter (HOSPITAL_COMMUNITY): Payer: Self-pay

## 2013-06-23 DIAGNOSIS — Z01812 Encounter for preprocedural laboratory examination: Secondary | ICD-10-CM | POA: Insufficient documentation

## 2013-06-23 DIAGNOSIS — Z01818 Encounter for other preprocedural examination: Secondary | ICD-10-CM | POA: Insufficient documentation

## 2013-06-23 DIAGNOSIS — Z01810 Encounter for preprocedural cardiovascular examination: Secondary | ICD-10-CM | POA: Insufficient documentation

## 2013-06-23 HISTORY — DX: Unspecified glaucoma: H40.9

## 2013-06-23 HISTORY — DX: Adverse effect of unspecified anesthetic, initial encounter: T41.45XA

## 2013-06-23 HISTORY — DX: Other complications of anesthesia, initial encounter: T88.59XA

## 2013-06-23 LAB — CBC
MCH: 32 pg (ref 26.0–34.0)
MCHC: 33.4 g/dL (ref 30.0–36.0)
RDW: 13.5 % (ref 11.5–15.5)

## 2013-06-23 LAB — BASIC METABOLIC PANEL
BUN: 14 mg/dL (ref 6–23)
Calcium: 9.8 mg/dL (ref 8.4–10.5)
GFR calc Af Amer: >90 mL/min (ref >90)
GFR calc non Af Amer: 80 mL/min — ABNORMAL LOW (ref >90)
Glucose, Bld: 85 mg/dL (ref 70–99)
Potassium: 4.2 mEq/L (ref 3.5–5.1)
Sodium: 137 mEq/L (ref 135–145)

## 2013-06-23 NOTE — Telephone Encounter (Signed)
Having eye surgery on 07-14-13 and needs to reschedule post op appointment.  Resched to 07-12-13. Routing to provider for final review. Patient agreeable to disposition. Will close encounter

## 2013-06-23 NOTE — Telephone Encounter (Signed)
Return call to patient, LMTCB.  

## 2013-06-23 NOTE — Patient Instructions (Signed)
Your procedure is scheduled on:06/28/13  Enter through the Main Entrance at :8am Pick up desk phone and dial [phone] and inform us of your arrival.  Please call [phone] if you have any problems the morning of surgery.  Remember: Do not eat food or drink liquids, including water, after midnight:Monday   You may brush your teeth the morning of surgery.  Take these meds the morning of surgery with a sip of water: Prilosec  DO NOT wear jewelry, eye make-up, lipstick,body lotion, or dark fingernail polish.  (Polished toes are ok) You may wear deodorant.  If you are to be admitted after surgery, leave suitcase in car until your room has been assigned. Patients discharged on the day of surgery will not be allowed to drive home. Wear loose fitting, comfortable clothes for your ride home.

## 2013-06-23 NOTE — Telephone Encounter (Signed)
Pt is having eye surgery first and need to reschedule her surgery at [facility]. Please call pt at [phone] to reschedule.

## 2013-06-23 NOTE — Telephone Encounter (Signed)
Patient is trying to call Kennon Rounds back.

## 2013-06-24 ENCOUNTER — Other Ambulatory Visit: Payer: Self-pay | Admitting: Internal Medicine

## 2013-06-28 ENCOUNTER — Encounter (HOSPITAL_COMMUNITY): Payer: Medicare Other | Admitting: Anesthesiology

## 2013-06-28 ENCOUNTER — Telehealth: Payer: Self-pay | Admitting: Obstetrics & Gynecology

## 2013-06-28 ENCOUNTER — Ambulatory Visit (HOSPITAL_COMMUNITY): Payer: Medicare Other | Admitting: Anesthesiology

## 2013-06-28 ENCOUNTER — Ambulatory Visit (HOSPITAL_COMMUNITY)
Admission: RE | Admit: 2013-06-28 | Discharge: 2013-06-28 | Disposition: A | Discharge status: Home / Self Care | Payer: Medicare Other | Source: Ambulatory Visit | Attending: Obstetrics & Gynecology | Admitting: Obstetrics & Gynecology

## 2013-06-28 ENCOUNTER — Encounter (HOSPITAL_COMMUNITY): Payer: Self-pay | Admitting: Anesthesiology

## 2013-06-28 ENCOUNTER — Encounter (HOSPITAL_COMMUNITY)
Admission: RE | Disposition: A | Discharge status: Home / Self Care | Payer: Self-pay | Source: Ambulatory Visit | Attending: Obstetrics & Gynecology

## 2013-06-28 DIAGNOSIS — N9089 Other specified noninflammatory disorders of vulva and perineum: Secondary | ICD-10-CM

## 2013-06-28 DIAGNOSIS — L723 Sebaceous cyst: Secondary | ICD-10-CM

## 2013-06-28 HISTORY — PX: LESION REMOVAL: SHX5196

## 2013-06-28 SURGERY — EXCISION, LESION, VAGINA
Anesthesia: General | Site: Vulva | Laterality: Right | Wound class: Clean

## 2013-06-28 MED ORDER — LIDOCAINE HCL 1 % IJ SOLN
INTRAMUSCULAR | Status: DC | PRN
  Administered 2013-06-28: 4 mL
  Administered 2013-06-28: 2 mL

## 2013-06-28 MED ORDER — HYDROCODONE-ACETAMINOPHEN 5-300 MG PO TABS: 1.0000 | ORAL_TABLET | Freq: Four times a day (QID) | ORAL | Status: DC

## 2013-06-28 MED ORDER — ONDANSETRON HCL 4 MG/2ML IJ SOLN
INTRAMUSCULAR | Status: DC | PRN
  Administered 2013-06-28: 4 mg via INTRAVENOUS

## 2013-06-28 MED ORDER — PROPOFOL 10 MG/ML IV EMUL
INTRAVENOUS | Status: AC
  Filled 2013-06-28: qty 20

## 2013-06-28 MED ORDER — LIDOCAINE HCL (CARDIAC) 20 MG/ML IV SOLN
INTRAVENOUS | Status: DC | PRN
  Administered 2013-06-28: 80 mg via INTRAVENOUS

## 2013-06-28 MED ORDER — LIDOCAINE HCL 1 % IJ SOLN
INTRAMUSCULAR | Status: AC
  Filled 2013-06-28: qty 20

## 2013-06-28 MED ORDER — PROPOFOL 10 MG/ML IV BOLUS
INTRAVENOUS | Status: DC | PRN
  Administered 2013-06-28: 150 mg via INTRAVENOUS
  Administered 2013-06-28: 50 mg via INTRAVENOUS

## 2013-06-28 MED ORDER — CEFAZOLIN SODIUM-DEXTROSE 2-3 GM-% IV SOLR
INTRAVENOUS | Status: AC
  Filled 2013-06-28: qty 50

## 2013-06-28 MED ORDER — LIDOCAINE 5 % EX OINT: 1.0000 "application " | TOPICAL_OINTMENT | Freq: Three times a day (TID) | CUTANEOUS | Status: DC

## 2013-06-28 MED ORDER — LACTATED RINGERS IV SOLN
INTRAVENOUS | Status: DC
  Administered 2013-06-28: 08:00:00 via INTRAVENOUS

## 2013-06-28 MED ORDER — CEFAZOLIN SODIUM-DEXTROSE 2-3 GM-% IV SOLR
2.0000 g | INTRAVENOUS | Status: AC
  Administered 2013-06-28: 2 g via INTRAVENOUS

## 2013-06-28 SURGICAL SUPPLY — 35 items
BLADE SURG 15 STRL LF C SS BP (BLADE) ×1 IMPLANT
BLADE SURG 15 STRL SS (BLADE) ×2
CLOTH BEACON ORANGE TIMEOUT ST (SAFETY) ×2 IMPLANT
COUNTER NEEDLE 1200 MAGNETIC (NEEDLE) ×1 IMPLANT
DRESSING TELFA 8X3 (GAUZE/BANDAGES/DRESSINGS) ×2 IMPLANT
ELECT NDL TIP 2.8 STRL (NEEDLE) IMPLANT
ELECT NEEDLE TIP 2.8 STRL (NEEDLE) IMPLANT
ELECT REM PT RETURN 9FT ADLT (ELECTROSURGICAL) ×2
ELECTRODE REM PT RTRN 9FT ADLT (ELECTROSURGICAL) IMPLANT
EVACUATOR PREFILTER SMOKE (MISCELLANEOUS) IMPLANT
GLOVE BIOGEL PI IND STRL 7.0 (GLOVE) ×1 IMPLANT
GLOVE BIOGEL PI INDICATOR 7.0 (GLOVE) ×1
GLOVE ECLIPSE 6.5 STRL STRAW (GLOVE) ×4 IMPLANT
GOWN STRL REIN XL XLG (GOWN DISPOSABLE) ×4 IMPLANT
HOSE NS SMOKE EVAC 7/8 X6 (MISCELLANEOUS) IMPLANT
NDL HYPO 25X1 1.5 SAFETY (NEEDLE) ×1 IMPLANT
NDL SPNL 22GX3.5 QUINCKE BK (NEEDLE) IMPLANT
NEEDLE HYPO 25X1 1.5 SAFETY (NEEDLE) ×2 IMPLANT
NEEDLE SPNL 22GX3.5 QUINCKE BK (NEEDLE) IMPLANT
PACK VAGINAL MINOR WOMEN LF (CUSTOM PROCEDURE TRAY) ×2 IMPLANT
PAD OB MATERNITY 4.3X12.25 (PERSONAL CARE ITEMS) ×4 IMPLANT
PENCIL BUTTON HOLSTER BLD 10FT (ELECTRODE) IMPLANT
REDUCER FITTING SMOKE EVAC (MISCELLANEOUS) IMPLANT
SUT VIC AB 3-0 PS2 18 (SUTURE)
SUT VIC AB 3-0 PS2 18XBRD (SUTURE) IMPLANT
SUT VIC AB 3-0 SH 27 (SUTURE)
SUT VIC AB 3-0 SH 27X BRD (SUTURE) IMPLANT
SUT VIC AB 4-0 SH 27 (SUTURE)
SUT VIC AB 4-0 SH 27XANBCTRL (SUTURE) IMPLANT
SWAB CULTURE LIQ STUART DBL (MISCELLANEOUS) IMPLANT
SYR CONTROL 10ML LL (SYRINGE) ×2 IMPLANT
TOWEL OR 17X24 6PK STRL BLUE (TOWEL DISPOSABLE) ×4 IMPLANT
TUBE ANAEROBIC SPECIMEN COL (MISCELLANEOUS) IMPLANT
TUBING NON-CON 1/4 X 20 CONN (TUBING) IMPLANT
YANKAUER SUCT BULB TIP NO VENT (SUCTIONS) IMPLANT

## 2013-06-28 NOTE — Preoperative (Signed)
Beta Blockers   Reason not to administer Beta Blockers:B Blocker taken 06/27/13 in evening

## 2013-06-28 NOTE — Op Note (Signed)
06/28/2013  8:12 AM  PATIENT:  [name]  77 y.o. female  PRE-OPERATIVE DIAGNOSIS:  vulvar lesion, right labium  POST-OPERATIVE DIAGNOSIS:  vulvar lesion, right labium  PROCEDURE:  Procedure(s): EXCISION 3 cm right labial sebaceous cyst  SURGEON:  Pete Merten SUZANNE  ASSISTANTS: OR Staff   ANESTHESIA:   MAC  ESTIMATED BLOOD LOSS: 10cc  BLOOD ADMINISTERED:none   FLUIDS: 800cc LR  UOP: voided prior to going to OR  SPECIMEN:  Right labial cyst contents and cyst wall  DISPOSITION OF SPECIMEN:  PATHOLOGY  FINDINGS: 3cm smooth cyst with old clot and most likely sebaceous material within it  DESCRIPTION OF OPERATION: Patient taken to the OR.  Patient was placed in the supine position. A running IV was in place in her left arm. MAC anesthesia was administered by the anesthesia staff. Dr. Arby Barrette oversaw case. Once adequate anesthesia was confirmed, the legs are lifted to the low lithotomy position placed in the [place] stirrups. Sequential compression devices were on her lower extremities and functioning properly. Time out was performed. A clip was performed on the labial hair. The patient was prepped in a normal standard fashion with technicare skin prep.  Patient was then draped in a normal standard fashion and the legs were lifted to the high lithotomy position. The 3 cm lesion on the right labia was smooth and mobile. It was anesthetized with 1% lidocaine plain. Then using a #11 blade, skin was superficially incised a cyst wall was seen. It was bluish underneath the cyst wall and the Bovie was ready in case there was active bleeding.  The cyst wall was incised and or clot was present. This was removed from within the cyst wall as well as what appeared to be sebaceous material. The cyst wall was then teased free from the subcutaneous tissue. At this point there was no cyst still present.  There is some bleeding which was made hemostatic with cautery. A deep stitch of 3-0 Vicryl  was placed. Then the skin was closed with 2 figure-of-eight sutures of #3-0 Vicryl.  The skin was anesthetized again with 1% lidocaine plain for a total of about 6 cc being used. At this point the procedure was ended. Sponge, lap, needle, initially counts were correct x2. The patient was awakened from anesthesia and taken to recovery in stable condition.  COUNTS:  YES  PLAN OF CARE: Transfer to PACU

## 2013-06-28 NOTE — Telephone Encounter (Signed)
Return call to patient, I had called her yesterday to advise her that surgery time had been moved up. She states she is doing well post op, denies pain. Scheduled recheck for Monday 07-04-13, patient states Dr Hyacinth Meeker told her to call for Mon appt.  Routing to provider for final review. Patient agreeable to disposition. Will close encounter

## 2013-06-28 NOTE — Telephone Encounter (Signed)
Patient states she is returning a call from yesterday. She is not sure who called her and there were no open phone notes. Please advise?

## 2013-06-28 NOTE — H&P (Signed)
[name] is an 77 y.o. female here for removal of enlarging area on labia. Patient knows I am not 100% sure of what I am going to find.  She knows whatever is present, I will sent tissue to pathology and there will most likely be stiches present after surgery.  Risks and benefits have been discussed in my office and she has no new concerns today.  Pertinent Gynecological History: Menses: post-menopausal and no bleeding Bleeding: none Contraception: post menopausal status DES exposure: denies Blood transfusions: none Sexually transmitted diseases: no past history Previous GYN Procedures: none  Last mammogram: normal Date: 4/14 Last pap: normal Date: 2013  Menstrual History: Patient's last menstrual period was 08/11/1974.    Past Medical History  Diagnosis Date  . Colon polyps   . Depression   . GERD (gastroesophageal reflux disease)   . Osteoarthritis   . Hx: UTI (urinary tract infection)   . Stress incontinence, female   . Hypertension   . Whooping cough   . Macular degeneration   . Glaucoma   . Complication of anesthesia     takes longer to wake up- admitted overnight after colonoscopy    Past Surgical History  Procedure Laterality Date  . Bladder repair      x two  . Mohs surgery      procedure to remove basal cell  . Vertebroplasty secondary to traumatic compression fracture    . Cholecystectomy    . Abdominal hysterectomy    . Tonsillectomy and adenoidectomy    . Appendectomy    . Urethral sling  2007  . Urethral sling  1/12    midurethral     Family History  Problem Relation Age of Onset  . Breast cancer Maternal Grandmother   . Hypertension Sister   . Congestive Heart Failure Mother   . Thyroid disease Mother   . Thyroid disease Sister   . Osteoporosis Mother   . Rheum arthritis Daughter   . Alcoholism Mother     Social History:  reports that she has quit smoking. She does not have any smokeless tobacco history on file. She reports that she  does not drink alcohol or use illicit drugs.  Allergies:  Allergies  Allergen Reactions  . Codeine Nausea And Vomiting    Prescriptions prior to admission  Medication Sig Dispense Refill  . nadolol (CORGARD) 40 MG tablet take 1 tablet by mouth at bedtime  30 tablet  5  . omeprazole (PRILOSEC) 20 MG capsule Take 20 mg by mouth daily.        Marland Kitchen venlafaxine (EFFEXOR-XR) 150 MG 24 hr capsule Take 150 mg by mouth 2 (two) times daily.        Marland Kitchen VYTORIN 10-20 MG per tablet take 1 tablet by mouth once daily  30 tablet  5  . zolpidem (AMBIEN) 10 MG tablet Take 10 mg by mouth. Take 5mg  prn      . ALPHAGAN P 0.1 % SOLN Place 1 drop into the left eye 2 (two) times daily.       Marland Kitchen aspirin 81 MG tablet Take 81 mg by mouth daily.       . clobetasol ointment (TEMOVATE) 0.05 % apply to affected area twice a day topically AS NEEDED USE NO MORE THAN 5 DAYS  30 g  0  . ezetimibe-simvastatin (VYTORIN) 10-20 MG per tablet Take 1 tablet by mouth at bedtime.  90 tablet  3  . Menthol, Topical Analgesic, (BIOFREEZE ROLL-ON) 4 % GEL  Apply to area twice a day as needed  89 mL  2    Review of Systems  All other systems reviewed and are negative.    Blood pressure 154/69, pulse 63, temperature 97.3 F (36.3 C), temperature source Oral, resp. rate 18, height 5\' 2"  (1.575 m), weight 170 lb (77.111 kg), last menstrual period 08/11/1974, SpO2 100.00%. Physical Exam  Constitutional: She is oriented to person, place, and time. She appears well-developed and well-nourished.  Cardiovascular: Normal rate and regular rhythm.   Respiratory: Effort normal and breath sounds normal.  Genitourinary:     Neurological: She is alert and oriented to person, place, and time.  Skin: Skin is warm and dry.  Psychiatric: She has a normal mood and affect.    No results found for this or any previous visit (from the past 24 hour(s)).  No results found.  Assessment/Plan: 77 yo MWF here for excision of right vulvar lesion.  Pt  here and ready to proceed.  [name] SUZANNE 06/28/2013, 7:15 AM

## 2013-06-28 NOTE — Anesthesia Preprocedure Evaluation (Signed)
Anesthesia Evaluation  Patient identified by MRN, date of birth, ID band Patient awake    Reviewed: Allergy & Precautions, H&P , NPO status , Patient's Chart, lab work & pertinent test results, reviewed documented beta blocker date and time   Airway Mallampati: II TM Distance: >3 FB Neck ROM: full    Dental no notable dental hx. (+) Teeth Intact   Pulmonary neg pulmonary ROS, former smoker,          Cardiovascular hypertension, Pt. on home beta blockers     Neuro/Psych negative neurological ROS     GI/Hepatic negative GI ROS, Neg liver ROS, GERD-  Medicated and Controlled,  Endo/Other  negative endocrine ROS  Renal/GU negative Renal ROS  negative genitourinary   Musculoskeletal negative musculoskeletal ROS (+)   Abdominal Normal abdominal exam  (+)   Peds  Hematology negative hematology ROS (+)   Anesthesia Other Findings   Reproductive/Obstetrics negative OB ROS                           Anesthesia Physical Anesthesia Plan  ASA: II  Anesthesia Plan: General   Post-op Pain Management:    Induction: Intravenous  Airway Management Planned: LMA  Additional Equipment:   Intra-op Plan:   Post-operative Plan:   Informed Consent: I have reviewed the patients History and Physical, chart, labs and discussed the procedure including the risks, benefits and alternatives for the proposed anesthesia with the patient or authorized representative who has indicated his/her understanding and acceptance.     Plan Discussed with: CRNA, Surgeon and Anesthesiologist  Anesthesia Plan Comments: (PROPOFOL Only.)        Anesthesia Quick Evaluation

## 2013-06-28 NOTE — Transfer of Care (Signed)
Immediate Anesthesia Transfer of Care Note  Patient: [name]  Procedure(s) Performed: Procedure(s): EXCISION 3 cm right labial sebaceous cyst (Right)  Patient Location: PACU  Anesthesia Type:General  Level of Consciousness: awake, alert  and oriented  Airway & Oxygen Therapy: Patient Spontanous Breathing and Patient connected to nasal cannula oxygen  Post-op Assessment: Report given to PACU RN, Post -op Vital signs reviewed and stable and Patient moving all extremities  Post vital signs: Reviewed and stable  Complications: No apparent anesthesia complications

## 2013-06-28 NOTE — Anesthesia Postprocedure Evaluation (Signed)
  Anesthesia Post-op Note  Patient: [name]  Procedure(s) Performed: Procedure(s): EXCISION 3 cm right labial sebaceous cyst (Right) Patient is awake and responsive. Pain and nausea are reasonably well controlled. Vital signs are stable and clinically acceptable. Oxygen saturation is clinically acceptable. There are no apparent anesthetic complications at this time. Patient is ready for discharge.

## 2013-06-29 ENCOUNTER — Encounter (HOSPITAL_COMMUNITY): Payer: Self-pay | Admitting: Obstetrics & Gynecology

## 2013-07-04 ENCOUNTER — Encounter: Payer: Self-pay | Admitting: Obstetrics & Gynecology

## 2013-07-04 ENCOUNTER — Ambulatory Visit (INDEPENDENT_AMBULATORY_CARE_PROVIDER_SITE_OTHER): Payer: 59 | Admitting: Obstetrics & Gynecology

## 2013-07-04 VITALS — BP 142/82 | HR 60 | Resp 16 | Ht 61.5 in | Wt 173.6 lb

## 2013-07-04 DIAGNOSIS — Z9889 Other specified postprocedural states: Secondary | ICD-10-CM

## 2013-07-04 NOTE — Progress Notes (Signed)
Post Operative Visit  Procedure: Excision labial sebaceous cyst Days Post-op: 7  Subjective: Doing well.  No drainage.  No pain.  Took no pain medication.  Reviewed pathology with patient.   Objective: BP 142/82  Pulse 60  Resp 16  Ht 5' 1.5" (1.562 m)  Wt 173 lb 9.6 oz (78.744 kg)  BMI 32.27 kg/m2  LMP 08/11/1974  EXAM General: alert and cooperative Extremities: extremities normal, atraumatic, no cyanosis or edema Vaginal Bleeding: none Vulva:  Sutures removed without difficulty  Assessment: s/p excision of vulvar lesion  Plan: Return 1/15 for AEX.

## 2013-07-04 NOTE — Patient Instructions (Signed)
Please call with any new problems/issues. 

## 2013-07-12 ENCOUNTER — Ambulatory Visit: Payer: Self-pay | Admitting: Obstetrics & Gynecology

## 2013-07-12 ENCOUNTER — Telehealth: Payer: Self-pay | Admitting: Obstetrics & Gynecology

## 2013-07-12 NOTE — Telephone Encounter (Signed)
DNKA today? Call to patient and she states this appt was suppose to be canceled per Dr. Hyacinth Meeker. Patient states Dr. Hyacinth Meeker cleared [place] to go to the beach.

## 2013-07-12 NOTE — Telephone Encounter (Signed)
This should have been cancelled.  No DNKA fee.  Encounter closed.

## 2013-07-14 ENCOUNTER — Ambulatory Visit: Payer: Self-pay | Admitting: Obstetrics & Gynecology

## 2013-08-08 ENCOUNTER — Telehealth: Payer: Self-pay

## 2013-08-08 ENCOUNTER — Other Ambulatory Visit: Payer: Medicare Other

## 2013-08-08 DIAGNOSIS — R300 Dysuria: Secondary | ICD-10-CM

## 2013-08-08 LAB — URINALYSIS, ROUTINE W REFLEX MICROSCOPIC
Bilirubin Urine: NEGATIVE
Ketones, ur: NEGATIVE
Leukocytes, UA: NEGATIVE
Nitrite: NEGATIVE
Specific Gravity, Urine: 1.01 (ref 1.000–1.030)
Total Protein, Urine: NEGATIVE
Urobilinogen, UA: 0.2 (ref 0.0–1.0)
pH: 5.5 (ref 5.0–8.0)

## 2013-08-08 NOTE — Telephone Encounter (Signed)
The patient called and stated she is having urinary frequency and a burning sensation when urinating.  She is hoping something can be called in for a possible UTI  Callback - [phone]

## 2013-08-08 NOTE — Telephone Encounter (Signed)
Patient has been advised to come to our lab, order has been entered.

## 2013-08-08 NOTE — Telephone Encounter (Signed)
May come to lab for u/a with treatment to follow if positive. No OV

## 2013-08-16 ENCOUNTER — Telehealth: Payer: Self-pay | Admitting: *Deleted

## 2013-08-16 NOTE — Telephone Encounter (Signed)
[facility] Triage Call Report Triage Record Num: [id number] Operator: Gwinda Maine Patient Name: [name] Call Date & Time: 08/13/2013 3:22:06PM Patient Phone: [phone] PCP: Adella Hare Patient Gender: Female PCP Fax : [phone] Patient DOB: Mar 18, 1936 Practice Name: [facility] Reason for Call: Caller: [name]; PCP: Adella Hare (Adults only); CB#: [phone]; Call regarding Medication Issue; Medication(s): missing rx; Pain with urination and passing blood. Urine sample given on 08/08/13. Triaged per Bloody Urine guideline. Disposition for See Provider within 4 hours for "Urinary tract symptoms AND any flank, low back, lower abdominal or genital area pain." Care Advice given. Currently [facility] is closest to patient. [name] verbalized understanding. Protocol(s) Used: Bloody Urine Protocol(s) Used: Urinary Symptoms - Female Recommended Outcome per Protocol: See Provider within 4 hours Reason for Outcome: Blood in urine Urinary tract symptoms AND any flank, low back, lower abdominal or genital area (labia, vagina OR testicle/scrotum) pain Care Advice: ~ Another adult should drive. ~ List, or take, all current prescription(s), nonprescription or alternative medication(s) to provider for evaluation. ~ Go to the ED immediately if you develop unbearable flank, low back pain or lower abdominal pain.

## 2013-09-06 ENCOUNTER — Ambulatory Visit: Payer: 59 | Admitting: Obstetrics & Gynecology

## 2013-09-27 ENCOUNTER — Encounter: Payer: Self-pay | Admitting: Obstetrics & Gynecology

## 2013-09-29 ENCOUNTER — Ambulatory Visit (INDEPENDENT_AMBULATORY_CARE_PROVIDER_SITE_OTHER): Payer: 59 | Admitting: Obstetrics & Gynecology

## 2013-09-29 ENCOUNTER — Telehealth: Payer: Self-pay | Admitting: *Deleted

## 2013-09-29 ENCOUNTER — Telehealth: Payer: Self-pay | Admitting: Obstetrics & Gynecology

## 2013-09-29 ENCOUNTER — Encounter: Payer: Self-pay | Admitting: Obstetrics & Gynecology

## 2013-09-29 VITALS — BP 148/84 | HR 63 | Resp 14 | Ht 61.75 in | Wt 171.0 lb

## 2013-09-29 DIAGNOSIS — R21 Rash and other nonspecific skin eruption: Secondary | ICD-10-CM

## 2013-09-29 DIAGNOSIS — Z01419 Encounter for gynecological examination (general) (routine) without abnormal findings: Secondary | ICD-10-CM

## 2013-09-29 NOTE — Telephone Encounter (Signed)
Patient is scheduled at dermatology office 03.11.2015 @1440 . Patient aware.

## 2013-09-29 NOTE — Patient Instructions (Signed)

## 2013-09-29 NOTE — Progress Notes (Signed)
78 y.o. G4P2 MarriedCaucasianF here for annual exam.  Having a lot of issues with eyes.  Has Glaucoma in the right and macular degeneration in the right.  Has cataracts as well and is having that done this year.  Not scheduled yet.  No bleeding.  Patient's last menstrual period was 08/11/1974.          Sexually active: no  The current method of family planning is status post hysterectomy and post menopausal status.    Exercising: yes   Walking 2 days a week Smoker:  No   Quit smoking in 1967  Health Maintenance: Pap:  2005 Normal History of abnormal Pap:  no MMG:  12/02/12 Neg Colonoscopy:  05/2013 (1) polyp benign, Repeat in 5 yrs BMD:  06/22/2007 Normal TDaP: less than 10 years Screening Labs: PCP, Hb today: PCP, Urine today: PCP   reports that she quit smoking about 48 years ago. She has never used smokeless tobacco. She reports that she drinks about 0.5 ounces of alcohol per week. She reports that she does not use illicit drugs.  Past Medical History  Diagnosis Date  . Colon polyps   . Depression   . GERD (gastroesophageal reflux disease)   . Osteoarthritis   . Hx: UTI (urinary tract infection)   . Stress incontinence, female   . Hypertension   . Whooping cough   . Macular degeneration   . Glaucoma   . Complication of anesthesia     takes longer to wake up- admitted overnight after colonoscopy  . Hypercholesteremia   . Osteopenia     of the hip  . Lichen simplex chronicus     Past Surgical History  Procedure Laterality Date  . Bladder repair      x two  . Mohs surgery      procedure to remove basal cell  . Vertebroplasty secondary to traumatic compression fracture    . Cholecystectomy    . Abdominal hysterectomy      ovaries remain  . Tonsillectomy and adenoidectomy    . Appendectomy    . Urethral sling  2007  . Urethral sling  1/12    midurethral   . Lesion removal Right 06/28/2013    Procedure: EXCISION 3 cm right labial sebaceous cyst;  Surgeon: Lyman Speller, MD;  Location: [facility];  Service: Gynecology;  Laterality: Right;  . Eye surgery  06/2013    07/2013    Macular Dengeneration, Glaucoma    Current Outpatient Prescriptions  Medication Sig Dispense Refill  . Acetaminophen (TYLENOL PO) Take by mouth as needed.      Marland Kitchen aspirin 81 MG tablet Take 81 mg by mouth daily.       . clobetasol ointment (TEMOVATE) 0.05 % apply to affected area twice a day topically AS NEEDED USE NO MORE THAN 5 DAYS  30 g  0  . FLUZONE HIGH-DOSE injection       . Menthol, Topical Analgesic, (BIOFREEZE ROLL-ON) 4 % GEL Apply to area twice a day as needed  89 mL  2  . nadolol (CORGARD) 40 MG tablet take 1 tablet by mouth at bedtime  30 tablet  5  . omeprazole (PRILOSEC) 20 MG capsule Take 20 mg by mouth daily.        . SF 5000 PLUS 1.1 % CREA dental cream       . venlafaxine (EFFEXOR-XR) 150 MG 24 hr capsule Take 150 mg by mouth 2 (two) times daily.        Marland Kitchen  VYTORIN 10-20 MG per tablet take 1 tablet by mouth once daily  30 tablet  5  . zolpidem (AMBIEN) 10 MG tablet Take 10 mg by mouth. Take 5mg  prn      . lidocaine (XYLOCAINE) 5 % ointment Apply 1 application topically 3 (three) times daily.  1.25 g  0   No current facility-administered medications for this visit.    Family History  Problem Relation Age of Onset  . Breast cancer Maternal Grandmother   . Hypertension Sister   . Congestive Heart Failure Mother   . Thyroid disease Mother   . Thyroid disease Sister   . Osteoporosis Mother   . Rheum arthritis Daughter   . Alcoholism Mother     ROS:  Pertinent items are noted in HPI.  Otherwise, a comprehensive ROS was negative.  Exam:   BP 148/84  Pulse 63  Resp 14  Ht 5' 1.75" (1.568 m)  Wt 171 lb (77.565 kg)  BMI 31.55 kg/m2  LMP 08/11/1974    Height: 5' 1.75" (156.8 cm)  Ht Readings from Last 3 Encounters:  09/29/13 5' 1.75" (1.568 m)  07/04/13 5' 1.5" (1.562 m)  06/28/13 5\' 2"  (1.575 m)    General appearance: alert, cooperative and  appears stated age Head: Normocephalic, without obvious abnormality, atraumatic Neck: no adenopathy, supple, symmetrical, trachea midline and thyroid normal to inspection and palpation Lungs: clear to auscultation bilaterally Breasts: normal appearance, no masses or tenderness Heart: regular rate and rhythm Abdomen: soft, non-tender; bowel sounds normal; no masses,  no organomegaly Extremities: extremities normal, atraumatic, no cyanosis or edema Skin: erythematous, lace like rash on torso only Lymph nodes: Cervical, supraclavicular, and axillary nodes normal. No abnormal inguinal nodes palpated Neurologic: Grossly normal   Pelvic: External genitalia:  Anterior forcette with thickened tissue and excoriations              Urethra:  normal appearing urethra with no masses, tenderness or lesions              Bartholins and Skenes: normal                 Vagina: normal appearing vagina with normal color and discharge, no lesions, 3rd degree rectocele              Cervix: no lesions              Pap taken: no Bimanual Exam:  Uterus:  uterus absent              Adnexa: no mass, fullness, tenderness               Rectovaginal: Confirms               Anus:  normal sphincter tone, no lesions, hemorrhoids  A:  Well Woman with normal exam PMP, no HRT Significant urinary incontinence--wears pads and depends.  Has skin irritation from this Hypertension Skin rash just on torso   P:   Mammogram yearly pap smear not indicated BMD scheduled Refer to dermatology due to rash return annually or prn  An After Visit Summary was printed and given to the patient.

## 2013-09-29 NOTE — Telephone Encounter (Signed)
Called patient to notified of [facility] appt.  She is going to see Dr. Tawni Levy Wednesday March 11th @ 2:40pm ( first available). Address and office number given to patient. - Pt agreed with appt date and time.  Encounter closed.

## 2013-10-16 ENCOUNTER — Other Ambulatory Visit: Payer: Self-pay | Admitting: Internal Medicine

## 2013-10-27 ENCOUNTER — Telehealth: Payer: Self-pay

## 2013-10-27 NOTE — Telephone Encounter (Signed)
lmtcb-discuss BMD results.

## 2013-11-07 ENCOUNTER — Other Ambulatory Visit: Payer: Self-pay | Admitting: Physician Assistant

## 2013-12-23 ENCOUNTER — Other Ambulatory Visit: Payer: Self-pay

## 2013-12-23 ENCOUNTER — Other Ambulatory Visit: Payer: Self-pay | Admitting: *Deleted

## 2013-12-23 ENCOUNTER — Telehealth: Payer: Self-pay

## 2013-12-23 MED ORDER — NADOLOL 40 MG PO TABS: ORAL_TABLET | ORAL | Status: DC

## 2013-12-23 NOTE — Telephone Encounter (Signed)
Refill done.  

## 2013-12-23 NOTE — Telephone Encounter (Signed)
error 

## 2013-12-27 ENCOUNTER — Encounter: Payer: Self-pay | Admitting: Internal Medicine

## 2014-01-16 ENCOUNTER — Telehealth: Payer: Self-pay | Admitting: Obstetrics & Gynecology

## 2014-01-16 NOTE — Telephone Encounter (Signed)
6/8 lmtcb //kn

## 2014-01-16 NOTE — Telephone Encounter (Signed)
Pt is returning a call and is not sure who called her and why. There is no messages.

## 2014-01-16 NOTE — Telephone Encounter (Signed)
Called home. Busy signal. Message from Dr. Sabra Heck re: BMD done at [facility] on 10/14/13, BMD WNL, Great! No need to repeat study.

## 2014-01-19 ENCOUNTER — Ambulatory Visit (INDEPENDENT_AMBULATORY_CARE_PROVIDER_SITE_OTHER): Payer: Medicare Other | Admitting: Physician Assistant

## 2014-01-19 ENCOUNTER — Encounter: Payer: Self-pay | Admitting: Physician Assistant

## 2014-01-19 VITALS — BP 110/78 | HR 72 | Temp 97.7°F | Resp 18 | Wt 171.0 lb

## 2014-01-19 DIAGNOSIS — Z79899 Other long term (current) drug therapy: Secondary | ICD-10-CM

## 2014-01-19 MED ORDER — EZETIMIBE-SIMVASTATIN 10-20 MG PO TABS: ORAL_TABLET | ORAL | Status: DC

## 2014-01-19 MED ORDER — NADOLOL 40 MG PO TABS: ORAL_TABLET | ORAL | Status: DC

## 2014-01-19 NOTE — Patient Instructions (Signed)
Continue current medications.  We have refilled your medications to bridge to your new PCP.  Follow up as needed.

## 2014-01-19 NOTE — Progress Notes (Signed)
Subjective:    Patient ID: [name], female    DOB: 1936-06-08, 78 y.o.   MRN: [id number]  HPI Pt is a 78 y/o caucasian female with history of HTN and HLD presenting to the clinic for medication management. Pt here requesting refill for Nadolol and Vytorin. Tolerating medications well, she denies adverse effects, she has been stable on these medications for many years. She will be establishing with new PCP later this summer. She is asymptomatic, she denies F/C/N/V/D/SOB.   Review of Systems As per HPI and otherwise negative.     Past Medical History  Diagnosis Date  . Colon polyps   . Depression   . GERD (gastroesophageal reflux disease)   . Osteoarthritis   . Hx: UTI (urinary tract infection)   . Stress incontinence, female   . Hypertension   . Whooping cough   . Macular degeneration   . Glaucoma   . Complication of anesthesia     takes longer to wake up- admitted overnight after colonoscopy  . Hypercholesteremia   . Osteopenia     of the hip  . Lichen simplex chronicus    Past Surgical History  Procedure Laterality Date  . Bladder repair      x two  . Mohs surgery      procedure to remove basal cell  . Vertebroplasty secondary to traumatic compression fracture    . Cholecystectomy    . Abdominal hysterectomy      ovaries remain  . Tonsillectomy and adenoidectomy    . Appendectomy    . Urethral sling  2007  . Urethral sling  1/12    midurethral   . Lesion removal Right 06/28/2013    Procedure: EXCISION 3 cm right labial sebaceous cyst;  Surgeon: Lyman Speller, MD;  Location: [facility];  Service: Gynecology;  Laterality: Right;  . Eye surgery  11/14, 12/14    Macular Dengeneration, Glaucoma    reports that she quit smoking about 48 years ago. She has never used smokeless tobacco. She reports that she drinks about .5 ounces of alcohol per week. She reports that she does not use illicit drugs. family history includes Alcoholism in her mother; Breast  cancer in her maternal grandmother; Congestive Heart Failure in her mother; Hypertension in her sister; Osteoporosis in her mother; Rheum arthritis in her daughter; Thyroid disease in her mother and sister. Allergies  Allergen Reactions  . Codeine Nausea And Vomiting       Objective:   Physical Exam  Nursing note and vitals reviewed. Constitutional: She is oriented to person, place, and time. She appears well-developed and well-nourished. No distress.  HENT:  Head: Normocephalic and atraumatic.  Eyes: Conjunctivae and EOM are normal. Pupils are equal, round, and reactive to light.  Neck: Normal range of motion. Neck supple.  Cardiovascular: Normal rate, regular rhythm, normal heart sounds and intact distal pulses.  Exam reveals no gallop and no friction rub.   No murmur heard. Pulmonary/Chest: Effort normal and breath sounds normal. No respiratory distress. She has no wheezes. She has no rales. She exhibits no tenderness.  Musculoskeletal: Normal range of motion.  Neurological: She is alert and oriented to person, place, and time.  Skin: Skin is warm and dry. No rash noted. She is not diaphoretic. No erythema. No pallor.  Psychiatric: She has a normal mood and affect. Her behavior is normal. Judgment and thought content normal.    Filed Vitals:   01/19/14 1313  BP: 110/78  Pulse:  72  Temp: 97.7 F (36.5 C)  Resp: 18    Lab Results  Component Value Date   WBC 6.7 06/23/2013   HGB 14.7 06/23/2013   HCT 44.0 06/23/2013   PLT 196 06/23/2013   GLUCOSE 85 06/23/2013   CHOL 164 03/03/2013   TRIG 196.0* 03/03/2013   HDL 42.50 03/03/2013   LDLDIRECT 124.5 04/29/2010   LDLCALC 82 03/03/2013   ALT 16 03/03/2013   ALT 16 03/03/2013   AST 22 03/03/2013   AST 22 03/03/2013   NA 137 06/23/2013   K 4.2 06/23/2013   CL 100 06/23/2013   CREATININE 0.76 06/23/2013   BUN 14 06/23/2013   CO2 29 06/23/2013   TSH 2.85 03/03/2013        Assessment & Plan:  Jeni was seen today for  medication management.  Diagnoses and associated orders for this visit:  Medication management Comments: Nadolol and Vytorin. Tolerating well. Denies adverse effects. Will refill to bridge to new PCP later this summer. - nadolol (CORGARD) 40 MG tablet; take 1 tablet by mouth at bedtime - ezetimibe-simvastatin (VYTORIN) 10-20 MG per tablet; take 1 tablet by mouth once daily   Pt will be establishing with new PCP later this summer.  Plan to follow up as needed for all other concerns.  Patient Instructions  Continue current medications.  We have refilled your medications to bridge to your new PCP.  Follow up as needed.

## 2014-01-19 NOTE — Progress Notes (Signed)
Pre visit review using our clinic review tool, if applicable. No additional management support is needed unless otherwise documented below in the visit note. 

## 2014-01-26 NOTE — Telephone Encounter (Signed)
Pt notified of BMD results.  Pt is appreciative of call.  Pt [place] and [place] our calls.  Reassured pt that nothing is wrong and we were only calling with BMD results.    Pt agreeable with disposition.  Closing encounter.  [facility] BMD report previously scanned into EPIC.

## 2014-02-13 NOTE — Telephone Encounter (Signed)
Patient notified of BMD results.//kn 

## 2014-05-24 ENCOUNTER — Other Ambulatory Visit: Payer: Self-pay | Admitting: Obstetrics & Gynecology

## 2014-05-24 NOTE — Telephone Encounter (Signed)
Last AEX 09/29/13 Last refill 06/08/13 #30g/0R Next appt 10/03/14  Please advise.

## 2014-06-12 ENCOUNTER — Encounter: Payer: Self-pay | Admitting: Physician Assistant

## 2014-06-21 ENCOUNTER — Encounter: Payer: Self-pay | Admitting: Podiatry

## 2014-06-21 ENCOUNTER — Ambulatory Visit (INDEPENDENT_AMBULATORY_CARE_PROVIDER_SITE_OTHER): Payer: 59 | Admitting: Podiatry

## 2014-06-21 VITALS — BP 130/87 | HR 76 | Resp 12

## 2014-06-21 DIAGNOSIS — L603 Nail dystrophy: Secondary | ICD-10-CM

## 2014-06-21 DIAGNOSIS — I7300 Raynaud's syndrome without gangrene: Secondary | ICD-10-CM

## 2014-06-21 DIAGNOSIS — R0989 Other specified symptoms and signs involving the circulatory and respiratory systems: Secondary | ICD-10-CM

## 2014-06-21 NOTE — Progress Notes (Signed)
   Subjective:    Patient ID: [name], female    DOB: 12/16/1935, 78 y.o.   MRN: [id number]  HPI [name], 78 year old female, presents the office today with complaints of bilateral thin toenails as well as dry skin. She states that this is been present for greater than 5 years in the nails will periodically peel off. She denies any pain associated with the area. She said no prior treatment. She also states that her toes turn different colors including blue/purple and they feel cool. This has been ongoing for many years. She states that she has never had any vascular studies performed or been evaluated by vascular surgery. She denies any claudication symptoms. No other complaints at this time.   Review of Systems  Eyes: Positive for redness.  Neurological: Positive for dizziness.  Hematological: Bruises/bleeds easily.  All other systems reviewed and are negative.      Objective:   Physical Exam AAO x3, NAD DP/PT pulses palpable bilaterally although PT pulse decreased.  Protective sensation intact with Derrel Nip monofilament, vibratory sensation intact, Achilles tendon reflex intact Nails are dystrophic, discolored, brittle, thin. There is no surrounding erythema or drainage. There is no specific areas of tenderness overlying the nails. There is evidence of dry, scaly skin without any evidence of tinea pedis. No fissuring/bleeding identified. No open lesions. MMT 5/5, ROM WNL No calf pain, swelling, warmth, erythema. Toes to have purple discoloration to the distal aspects. Patient states that his been chronic ongoing without any acute changes.      Assessment & Plan:  78 year old female with discoloration/coldness to bilateral feet, thin/dystrophic nails. -Treatment options were discussed including alternatives, risks, competitions. -Due to the discoloration and the sensation of coldness to bilateral feet will obtain noninvasive vascular testing including ABIs to  evaluate her blood flow. Discussed also possible Raynaud's disease.  -Patient was inquiring about possible nail removal however we'll hold off on this due to discoloration in her toes due to possible nonhealing. -Moisturizer for skin. -follow-up after vascular studies. In the meantime, call the office with any questions, concerns, change in symptoms. Follow-up with PCP for other issues mentioned in the review of systems of these are chronic in no acute change.

## 2014-06-23 ENCOUNTER — Telehealth (HOSPITAL_COMMUNITY): Payer: Self-pay | Admitting: *Deleted

## 2014-06-26 ENCOUNTER — Other Ambulatory Visit: Payer: Self-pay | Admitting: Internal Medicine

## 2014-06-27 ENCOUNTER — Telehealth (HOSPITAL_COMMUNITY): Payer: Self-pay | Admitting: *Deleted

## 2014-06-30 ENCOUNTER — Ambulatory Visit (HOSPITAL_COMMUNITY)
Admission: RE | Admit: 2014-06-30 | Discharge: 2014-06-30 | Disposition: A | Discharge status: Home / Self Care | Payer: Medicare Other | Source: Ambulatory Visit | Attending: Cardiology | Admitting: Cardiology

## 2014-06-30 DIAGNOSIS — R0989 Other specified symptoms and signs involving the circulatory and respiratory systems: Secondary | ICD-10-CM | POA: Diagnosis not present

## 2014-06-30 DIAGNOSIS — I7300 Raynaud's syndrome without gangrene: Secondary | ICD-10-CM | POA: Insufficient documentation

## 2014-06-30 NOTE — Progress Notes (Signed)
Arterial Lower Ext. Duplex Completed. Jomaira Darr, BS, RDMS, RVT  

## 2014-07-17 ENCOUNTER — Telehealth: Payer: Self-pay

## 2014-07-17 DIAGNOSIS — Z79899 Other long term (current) drug therapy: Secondary | ICD-10-CM

## 2014-07-17 NOTE — Telephone Encounter (Signed)
Rx request for Vytorin 10-20 mg tablet- Take 1 tablet by mouth once daily #30.  Pharm:  [facility] and left a message for pt to return call.  The office needs to know if pt is going to stay with [facility] or if pt has new PCP.

## 2014-07-20 ENCOUNTER — Telehealth: Payer: Self-pay | Admitting: Podiatry

## 2014-07-20 MED ORDER — EZETIMIBE-SIMVASTATIN 10-20 MG PO TABS: ORAL_TABLET | ORAL | Status: DC

## 2014-07-20 NOTE — Telephone Encounter (Signed)
Pt would like to know if you will accept her as a pt? Pt's husband sees you now (seen you once). [name])  Both were norins pt. Husband would like to switch to Dr Yong Channel b/c he prefers a female. They would like to know if she could take his place as your pt?  [place], but are [place] to stay now.  Will you make the exchange? Pt is coming up for med refills and will be out next week.

## 2014-07-20 NOTE — Telephone Encounter (Signed)
She can be scheduled in as an establish care if she likes. vytorin sent in.

## 2014-07-20 NOTE — Telephone Encounter (Signed)
Called patient to discuss vascular study results. Patient understood, no questions.

## 2014-07-25 ENCOUNTER — Other Ambulatory Visit: Payer: Self-pay | Admitting: Internal Medicine

## 2014-07-25 ENCOUNTER — Telehealth: Payer: Self-pay | Admitting: *Deleted

## 2014-07-25 MED ORDER — NADOLOL 40 MG PO TABS: 40.0000 mg | ORAL_TABLET | Freq: Every day | ORAL | Status: DC

## 2014-07-25 NOTE — Telephone Encounter (Signed)
Left msg on triage pt is formal pt of Dr. Linda Hedges she is needing refill on her Nadolol. Pt has an appt set-up to see Dr. Doug Sou in February. Will send enough until appt...Johny Chess

## 2014-08-11 HISTORY — PX: EYE SURGERY: SHX253

## 2014-08-26 ENCOUNTER — Other Ambulatory Visit: Payer: Self-pay | Admitting: Internal Medicine

## 2014-10-03 ENCOUNTER — Ambulatory Visit: Payer: 59 | Admitting: Obstetrics & Gynecology

## 2014-10-04 ENCOUNTER — Encounter: Payer: Self-pay | Admitting: Internal Medicine

## 2014-10-04 ENCOUNTER — Other Ambulatory Visit (INDEPENDENT_AMBULATORY_CARE_PROVIDER_SITE_OTHER): Payer: Medicare Other

## 2014-10-04 ENCOUNTER — Ambulatory Visit (INDEPENDENT_AMBULATORY_CARE_PROVIDER_SITE_OTHER): Payer: Medicare Other | Admitting: Internal Medicine

## 2014-10-04 VITALS — BP 130/72 | HR 65 | Temp 97.7°F | Resp 12 | Ht 62.0 in | Wt 164.8 lb

## 2014-10-04 DIAGNOSIS — Z0000 Encounter for general adult medical examination without abnormal findings: Secondary | ICD-10-CM

## 2014-10-04 DIAGNOSIS — F329 Major depressive disorder, single episode, unspecified: Secondary | ICD-10-CM

## 2014-10-04 DIAGNOSIS — E785 Hyperlipidemia, unspecified: Secondary | ICD-10-CM

## 2014-10-04 DIAGNOSIS — F32A Depression, unspecified: Secondary | ICD-10-CM

## 2014-10-04 DIAGNOSIS — I10 Essential (primary) hypertension: Secondary | ICD-10-CM

## 2014-10-04 DIAGNOSIS — N39498 Other specified urinary incontinence: Secondary | ICD-10-CM

## 2014-10-04 LAB — BASIC METABOLIC PANEL
BUN: 17 mg/dL (ref 6–23)
CHLORIDE: 103 meq/L (ref 96–112)
CO2: 32 meq/L (ref 19–32)
Calcium: 9.6 mg/dL (ref 8.4–10.5)
Creatinine, Ser: 0.82 mg/dL (ref 0.40–1.20)
GFR: 71.85 mL/min (ref >60.00)
GLUCOSE: 89 mg/dL (ref 70–99)
Potassium: 4.3 mEq/L (ref 3.5–5.1)
Sodium: 139 mEq/L (ref 135–145)

## 2014-10-04 LAB — LIPID PANEL
Cholesterol: 154 mg/dL (ref 0–200)
HDL: 44 mg/dL (ref >39.00)
LDL Cholesterol: 72 mg/dL (ref 0–99)
NONHDL: 110
TRIGLYCERIDES: 192 mg/dL — AB (ref 0.0–149.0)
Total CHOL/HDL Ratio: 4
VLDL: 38.4 mg/dL (ref 0.0–40.0)

## 2014-10-04 NOTE — Patient Instructions (Signed)
We will check your blood work today and call you back with the results.   For the bladder I recommend to get on a schedule of going to the bathroom every 2 hours. If you do well with that you can move it to every 3 hours until you can do well with not leaking as much.   The head looks normal, and I recommend going to the gym with the silver sneaker to see if there is a balance class or yoga or tai chi. These all help with balance.   Fall Prevention and Home Safety Falls cause injuries and can affect all age groups. It is possible to use preventive measures to significantly decrease the likelihood of falls. There are many simple measures which can make your home safer and prevent falls. OUTDOORS  Repair cracks and edges of walkways and driveways.  Remove high doorway thresholds.  Trim shrubbery on the main path into your home.  Have good outside lighting.  Clear walkways of tools, rocks, debris, and clutter.  Check that handrails are not broken and are securely fastened. Both sides of steps should have handrails.  Have leaves, snow, and ice cleared regularly.  Use sand or salt on walkways during winter months.  In the garage, clean up grease or oil spills. BATHROOM  Install night lights.  Install grab bars by the toilet and in the tub and shower.  Use non-skid mats or decals in the tub or shower.  Place a plastic non-slip stool in the shower to sit on, if needed.  Keep floors dry and clean up all water on the floor immediately.  Remove soap buildup in the tub or shower on a regular basis.  Secure bath mats with non-slip, double-sided rug tape.  Remove throw rugs and tripping hazards from the floors. BEDROOMS  Install night lights.  Make sure a bedside light is easy to reach.  Do not use oversized bedding.  Keep a telephone by your bedside.  Have a firm chair with side arms to use for getting dressed.  Remove throw rugs and tripping hazards from the  floor. KITCHEN  Keep handles on pots and pans turned toward the center of the stove. Use back burners when possible.  Clean up spills quickly and allow time for drying.  Avoid walking on wet floors.  Avoid hot utensils and knives.  Position shelves so they are not too high or low.  Place commonly used objects within easy reach.  If necessary, use a sturdy step stool with a grab bar when reaching.  Keep electrical cables out of the way.  Do not use floor polish or wax that makes floors slippery. If you must use wax, use non-skid floor wax.  Remove throw rugs and tripping hazards from the floor. STAIRWAYS  Never leave objects on stairs.  Place handrails on both sides of stairways and use them. Fix any loose handrails. Make sure handrails on both sides of the stairways are as long as the stairs.  Check carpeting to make sure it is firmly attached along stairs. Make repairs to worn or loose carpet promptly.  Avoid placing throw rugs at the top or bottom of stairways, or properly secure the rug with carpet tape to prevent slippage. Get rid of throw rugs, if possible.  Have an electrician put in a light switch at the top and bottom of the stairs. OTHER FALL PREVENTION TIPS  Wear low-heel or rubber-soled shoes that are supportive and fit well. Wear closed toe shoes.  When using a stepladder, make sure it is fully opened and both spreaders are firmly locked. Do not climb a closed stepladder.  Add color or contrast paint or tape to grab bars and handrails in your home. Place contrasting color strips on first and last steps.  Learn and use mobility aids as needed. Install an electrical emergency response system.  Turn on lights to avoid dark areas. Replace light bulbs that burn out immediately. Get light switches that glow.  Arrange furniture to create clear pathways. Keep furniture in the same place.  Firmly attach carpet with non-skid or double-sided tape.  Eliminate uneven  floor surfaces.  Select a carpet pattern that does not visually hide the edge of steps.  Be aware of all pets. OTHER HOME SAFETY TIPS  Set the water temperature for 120 F (48.8 C).  Keep emergency numbers on or near the telephone.  Keep smoke detectors on every level of the home and near sleeping areas. Document Released: 07/18/2002 Document Revised: 01/27/2012 Document Reviewed: 10/17/2011 [facility] Patient Information 2015 [place], [place]. This information is not intended to replace advice given to you by your health care provider. Make sure you discuss any questions you have with your health care provider.

## 2014-10-04 NOTE — Progress Notes (Signed)
Pre visit review using our clinic review tool, if applicable. No additional management support is needed unless otherwise documented below in the visit note. 

## 2014-10-05 ENCOUNTER — Encounter: Payer: Self-pay | Admitting: Internal Medicine

## 2014-10-05 NOTE — Assessment & Plan Note (Signed)
Mood well controlled on effexor. Uses xanax rarely.

## 2014-10-05 NOTE — Assessment & Plan Note (Signed)
Check lipid panel and adjust as needed. Currently on vytorin without side effects.

## 2014-10-05 NOTE — Progress Notes (Signed)
   Subjective:    Patient ID: [name], female    DOB: 1935/12/13, 79 y.o.   MRN: [id number]  HPI The patient is a 79 YO female who is here for medicare wellness. She is still having a lot of problems with urinary incontinence despite several surgeries to help correct this. No new problems or surgeries since last year.   Diet: heart healthy Physical activity: sedentary Depression/mood screen: negative Hearing: mild loss, she declines hearing exam Visual acuity: grossly normal, performs annual eye exam ADLs: capable  Fall risk: none Home safety: good Cognitive evaluation: intact to orientation, naming, recall and repetition EOL planning: adv directives discussed and she will think about it  I have personally reviewed and have noted 1. The patient's medical and social history 2. Their use of alcohol, tobacco or illicit drugs 3. Their current medications and supplements 4. The patient's functional ability including ADL's, fall risks, home safety risks and hearing or visual impairment. 5. Diet and physical activities 6. Evidence for depression or mood disorders 7. Care team reviewed and updated  Review of Systems  Constitutional: Negative for fever, activity change, appetite change, fatigue and unexpected weight change.  HENT: Negative.   Eyes: Negative.   Respiratory: Negative for cough, chest tightness, shortness of breath and wheezing.   Cardiovascular: Negative for chest pain, palpitations and leg swelling.  Gastrointestinal: Negative for nausea, abdominal pain, diarrhea, constipation and abdominal distention.  Genitourinary:       Urinary incontinence  Musculoskeletal: Negative for myalgias and gait problem.  Skin: Negative.   Neurological: Negative.   Psychiatric/Behavioral: Negative.       Objective:   Physical Exam  Constitutional: She is oriented to person, place, and time. She appears well-developed and well-nourished.  HENT:  Head: Normocephalic and  atraumatic.  Eyes: EOM are normal.  Neck: Normal range of motion.  Cardiovascular: Normal rate and regular rhythm.   Pulmonary/Chest: Effort normal and breath sounds normal. No respiratory distress. She has no wheezes. She has no rales.  Abdominal: Soft. Bowel sounds are normal. She exhibits no distension. There is no tenderness. There is no rebound.  Musculoskeletal: She exhibits no edema.  Neurological: She is alert and oriented to person, place, and time. Coordination normal.  Skin: Skin is warm and dry.  Psychiatric: She has a normal mood and affect. Her behavior is normal.   Filed Vitals:   10/04/14 0906  BP: 130/72  Pulse: 65  Temp: 97.7 F (36.5 C)  TempSrc: Oral  Resp: 12  Height: 5\' 2"  (1.575 m)  Weight: 164 lb 12.8 oz (74.753 kg)  SpO2: 98%      Assessment & Plan:

## 2014-10-05 NOTE — Assessment & Plan Note (Signed)
Spoke with her about scheduled toileting to avoid large volume leakage. She will start with going every 2 hours on schedule and increase time gap as able.

## 2014-10-05 NOTE — Assessment & Plan Note (Signed)
Colonoscopy due 2019 if still in good health, tetanus due in 2019, pneumonia up to date. Shingles shot done. Flu shot up to date. Dexa done and without problems. She does not exercise much due to her incontinence so hoping that the scheduled toileting will help.

## 2014-10-05 NOTE — Assessment & Plan Note (Signed)
BP well controlled on nadolol without side effects. Will continue and check BMP today.

## 2014-10-19 ENCOUNTER — Other Ambulatory Visit: Payer: Self-pay | Admitting: Internal Medicine

## 2014-12-11 ENCOUNTER — Ambulatory Visit (INDEPENDENT_AMBULATORY_CARE_PROVIDER_SITE_OTHER): Payer: Medicare Other | Admitting: Obstetrics & Gynecology

## 2014-12-11 ENCOUNTER — Encounter: Payer: Self-pay | Admitting: Obstetrics & Gynecology

## 2014-12-11 VITALS — BP 122/80 | HR 68 | Resp 16 | Ht 61.5 in | Wt 162.8 lb

## 2014-12-11 DIAGNOSIS — R319 Hematuria, unspecified: Secondary | ICD-10-CM

## 2014-12-11 DIAGNOSIS — N390 Urinary tract infection, site not specified: Secondary | ICD-10-CM

## 2014-12-11 DIAGNOSIS — Z01419 Encounter for gynecological examination (general) (routine) without abnormal findings: Secondary | ICD-10-CM | POA: Diagnosis not present

## 2014-12-11 LAB — POCT URINALYSIS DIPSTICK
Bilirubin, UA: NEGATIVE
Glucose, UA: NEGATIVE
Ketones, UA: NEGATIVE
Nitrite, UA: POSITIVE
Urobilinogen, UA: NEGATIVE
pH, UA: 5

## 2014-12-11 MED ORDER — CLOBETASOL PROPIONATE 0.05 % EX OINT: TOPICAL_OINTMENT | Freq: Two times a day (BID) | CUTANEOUS | Status: DC

## 2014-12-11 MED ORDER — PHENAZOPYRIDINE HCL 100 MG PO TABS: 100.0000 mg | ORAL_TABLET | Freq: Three times a day (TID) | ORAL | Status: DC | PRN

## 2014-12-11 MED ORDER — NITROFURANTOIN MONOHYD MACRO 100 MG PO CAPS: 100.0000 mg | ORAL_CAPSULE | Freq: Two times a day (BID) | ORAL | Status: DC

## 2014-12-11 NOTE — Progress Notes (Signed)
79 y.o. G4P2 MarriedCaucasianF here for annual exam.  Reports having low pelvic pain and burning with urination for two days.  Has seen blood in her urine today.  No back pain and denies fever.  Denies vaginal bleeding.  Reports having MMG today.  Reports having eye surgery this summer.  Has glaucoma in her left eye and macular degeneration in the right.    Patient's last menstrual period was 08/11/1974.          Sexually active: no    The current method of family planning is status post hysterectomy.    Exercising: No.  not regularly Smoker:  Former smoker years ago  Health Maintenance: Pap:  2005-normal History of abnormal Pap:  no MMG:  12/11/14 [facility] Colonoscopy:  10/14-repeat in 5 years BMD:  10/14/13--normal TDaP:  03/09/08 Screening Labs: PCP, Hb today: PCP, Urine today: WBC-trace, PROTEIN-1+, NITRITE-positive, RBC-3+   reports that she quit smoking about 49 years ago. She has never used smokeless tobacco. She reports that she drinks about 0.6 oz of alcohol per week. She reports that she does not use illicit drugs.  Past Medical History  Diagnosis Date  . Colon polyps   . Depression   . GERD (gastroesophageal reflux disease)   . Osteoarthritis   . Hx: UTI (urinary tract infection)   . Stress incontinence, female   . Hypertension   . Whooping cough   . Macular degeneration   . Glaucoma   . Complication of anesthesia     takes longer to wake up- admitted overnight after colonoscopy  . Hypercholesteremia   . Osteopenia     of the hip  . Lichen simplex chronicus     Past Surgical History  Procedure Laterality Date  . Bladder repair      x two  . Mohs surgery      procedure to remove basal cell  . Vertebroplasty secondary to traumatic compression fracture    . Cholecystectomy    . Abdominal hysterectomy      ovaries remain  . Tonsillectomy and adenoidectomy    . Appendectomy    . Urethral sling  2007  . Urethral sling  1/12    midurethral   . Lesion removal  Right 06/28/2013    Procedure: EXCISION 3 cm right labial sebaceous cyst;  Surgeon: Lyman Speller, MD;  Location: [facility];  Service: Gynecology;  Laterality: Right;  . Eye surgery  11/14, 12/14    Macular Dengeneration, Glaucoma  . Eye surgery  2016    left eye    Current Outpatient Prescriptions  Medication Sig Dispense Refill  . Acetaminophen (TYLENOL PO) Take by mouth as needed.    . ALPRAZolam (XANAX) 0.5 MG tablet TAKE 1 TABLET BY MOUTH EVERY 6 HOURS AS NEEDED FOR ANXIETY 30 tablet 5  . aspirin 81 MG tablet Take 81 mg by mouth daily.     . clobetasol ointment (TEMOVATE) 0.05 % apply to affected area twice a day if needed DO NOT USE FOR MORE THAN 5 DAYS 30 g 1  . COMBIGAN 0.2-0.5 % ophthalmic solution   0  . ezetimibe-simvastatin (VYTORIN) 10-20 MG per tablet take 1 tablet by mouth once daily 90 tablet 2  . latanoprost (XALATAN) 0.005 % ophthalmic solution   0  . Menthol, Topical Analgesic, (BIOFREEZE ROLL-ON) 4 % GEL Apply to area twice a day as needed 89 mL 2  . nadolol (CORGARD) 40 MG tablet take 1 tablet by mouth at bedtime 30 tablet 1  .  omeprazole (PRILOSEC) 20 MG capsule Take 20 mg by mouth daily.      Marland Kitchen venlafaxine (EFFEXOR-XR) 150 MG 24 hr capsule Take 150 mg by mouth 2 (two) times daily.      Marland Kitchen zolpidem (AMBIEN) 10 MG tablet Take 10 mg by mouth. Take 5mg  prn    . SF 5000 PLUS 1.1 % CREA dental cream      No current facility-administered medications for this visit.    Family History  Problem Relation Age of Onset  . Breast cancer Maternal Grandmother   . Hypertension Sister   . Congestive Heart Failure Mother   . Thyroid disease Mother   . Thyroid disease Sister   . Osteoporosis Mother   . Rheum arthritis Daughter   . Alcoholism Mother     ROS:  Pertinent items are noted in HPI.  Otherwise, a comprehensive ROS was negative.  Exam:   BP 122/80 mmHg  Pulse 68  Resp 16  Ht 5' 1.5" (1.562 m)  Wt 162 lb 12.8 oz (73.846 kg)  BMI 30.27 kg/m2  LMP 08/11/1974   Weight change: -9#   Height: 5' 1.5" (156.2 cm)  Ht Readings from Last 3 Encounters:  12/11/14 5' 1.5" (1.562 m)  10/04/14 5\' 2"  (1.575 m)  09/29/13 5' 1.75" (1.568 m)    General appearance: alert, cooperative and appears stated age Head: Normocephalic, without obvious abnormality, atraumatic Neck: no adenopathy, supple, symmetrical, trachea midline and thyroid normal to inspection and palpation Lungs: clear to auscultation bilaterally Breasts: normal appearance, no masses or tenderness Heart: regular rate and rhythm Abdomen: soft, non-tender; bowel sounds normal; no masses,  no organomegaly Extremities: extremities normal, atraumatic, no cyanosis or edema Skin: Skin color, texture, turgor normal. No rashes or lesions Lymph nodes: Cervical, supraclavicular, and axillary nodes normal. No abnormal inguinal nodes palpated Neurologic: Grossly normal   Pelvic: External genitalia:  no lesions              Urethra:  normal appearing urethra with no masses, tenderness or lesions              Bartholins and Skenes: normal                 Vagina: normal appearing vagina with normal color and discharge, no lesions              Cervix: no lesions              Pap taken: No. Bimanual Exam:  Uterus:  normal size, contour, position, consistency, mobility, non-tender              Adnexa: normal adnexa and no mass, fullness, tenderness               Rectovaginal: Confirms               Anus:  normal sphincter tone, no lesions  Chaperone was present for exam.  A:  Well Woman with normal exam PMP, no HRT UTI today Significant urinary incontinence--wears pads and depends. Has skin irritation from this Hypertension  P: Mammogram yearly.  Just did it today. pap smear not indicated Urine culture and micro pending Macrobid 100mg  bid x 7 days.  #14/0RF and Pyridium 100mg  tid prn dysuria Recommend clobetasol 0.05% ointment bid for up to 14 days.  Rx to pharmacy.  Pt will start using  Vaseline for skin barrier. return annually or prn

## 2014-12-12 LAB — URINALYSIS, MICROSCOPIC ONLY
Bacteria, UA: NONE SEEN
CASTS: NONE SEEN
CRYSTALS: NONE SEEN
SQUAMOUS EPITHELIAL / LPF: NONE SEEN

## 2014-12-14 LAB — URINE CULTURE: Colony Count: 50000

## 2014-12-15 ENCOUNTER — Telehealth: Payer: Self-pay

## 2014-12-15 NOTE — Telephone Encounter (Signed)
Lmtcb//kn 

## 2014-12-15 NOTE — Addendum Note (Signed)
Addended by: Megan Salon on: 12/15/2014 04:24 PM   Modules accepted: Orders, SmartSet

## 2014-12-15 NOTE — Telephone Encounter (Signed)
-----   Message from Megan Salon, MD sent at 12/15/2014  4:23 PM EDT ----- Inform urine culture + for e coli.  Needs to finish abx.  TOC urine culture two weeks.  Order placed.

## 2014-12-18 NOTE — Telephone Encounter (Signed)
Pt returning call

## 2014-12-19 MED ORDER — SULFAMETHOXAZOLE-TRIMETHOPRIM 800-160 MG PO TABS: 1.0000 | ORAL_TABLET | Freq: Two times a day (BID) | ORAL | Status: DC

## 2014-12-19 NOTE — Telephone Encounter (Signed)
Patient notified of results.//kn 

## 2014-12-19 NOTE — Addendum Note (Signed)
Addended by: Megan Salon on: 12/19/2014 12:50 PM   Modules accepted: Orders, SmartSet

## 2014-12-20 ENCOUNTER — Ambulatory Visit (INDEPENDENT_AMBULATORY_CARE_PROVIDER_SITE_OTHER): Payer: Medicare Other | Admitting: *Deleted

## 2014-12-20 DIAGNOSIS — R319 Hematuria, unspecified: Secondary | ICD-10-CM

## 2014-12-20 DIAGNOSIS — N390 Urinary tract infection, site not specified: Secondary | ICD-10-CM

## 2014-12-20 LAB — URINALYSIS, MICROSCOPIC ONLY
Bacteria, UA: NONE SEEN
CRYSTALS: NONE SEEN
Casts: NONE SEEN

## 2014-12-20 NOTE — Progress Notes (Signed)
Patient is here for urine toc Patient says she has stopped Macrobid and has started taking Bactrim she has taken one pill last night and this morning, is still having urinary pressure. Advised to complete antibiotics.  Urine sent for culture and micro, patient is aware someone will call her with the results as soon as they come in.  Routed to provider for review, encounter closed.

## 2014-12-21 LAB — URINE CULTURE
Colony Count: NO GROWTH
ORGANISM ID, BACTERIA: NO GROWTH

## 2014-12-24 ENCOUNTER — Other Ambulatory Visit: Payer: Self-pay | Admitting: Internal Medicine

## 2014-12-25 ENCOUNTER — Encounter: Payer: Self-pay | Admitting: Internal Medicine

## 2014-12-27 ENCOUNTER — Encounter: Payer: Self-pay | Admitting: Internal Medicine

## 2015-02-05 ENCOUNTER — Other Ambulatory Visit: Payer: Self-pay

## 2015-02-24 ENCOUNTER — Other Ambulatory Visit: Payer: Self-pay | Admitting: Internal Medicine

## 2015-04-02 ENCOUNTER — Telehealth: Payer: Self-pay

## 2015-04-02 NOTE — Telephone Encounter (Signed)
Call to educate on awv and discuss apt prior to CPE on 9/8 with Dr. Doug Sou; LVM to call back.

## 2015-04-06 NOTE — Telephone Encounter (Signed)
Noted AWV apt for 9/8 at 8:45 and added to [place]

## 2015-04-10 ENCOUNTER — Other Ambulatory Visit: Payer: Self-pay | Admitting: Internal Medicine

## 2015-04-10 NOTE — Telephone Encounter (Signed)
Patient would like for you to concerning her sept appointment, please advise

## 2015-04-11 ENCOUNTER — Other Ambulatory Visit: Payer: Self-pay | Admitting: Geriatric Medicine

## 2015-04-11 MED ORDER — EZETIMIBE-SIMVASTATIN 10-20 MG PO TABS: 1.0000 | ORAL_TABLET | Freq: Every day | ORAL | Status: DC

## 2015-04-19 ENCOUNTER — Encounter: Payer: Medicare Other | Admitting: Internal Medicine

## 2015-04-20 ENCOUNTER — Telehealth: Payer: Self-pay

## 2015-04-20 NOTE — Telephone Encounter (Signed)
Call to reschedule AWV and left message to call Eriyana Sweeten at [phone]. Apt cancelled scheduled earlier

## 2015-04-22 ENCOUNTER — Other Ambulatory Visit: Payer: Self-pay | Admitting: Internal Medicine

## 2015-04-27 ENCOUNTER — Other Ambulatory Visit: Payer: Self-pay | Admitting: Internal Medicine

## 2015-05-02 ENCOUNTER — Telehealth: Payer: Self-pay

## 2015-05-02 NOTE — Telephone Encounter (Signed)
Could schedule office visit to discuss the recommendations and if they are needed.

## 2015-05-02 NOTE — Telephone Encounter (Signed)
Call to fup on AWV; educated the patient regarding AWV and reviewed some preventive screens; STated that her insurance company was denying one of her visit as she seen Dr. Doug Sou for a preventive exam and GYN; Insurance finally paid for both; neither billed AWV; Discussed the differences and will post pone as most of the highlights of the exam were discussed. The patient admitted to fall yesterday, but tripped over bed skirting and was not injured;  Stated she [facility]- [facility]  Also stated she rec'd letter stating that she can pay out of pocket to come have carotid and AAA screenings; Stated that she has not cardiac hx but will alert Dr. Doug Sou for her recommendation as the patient wants to know if she "needs" to do this.  Will get back to her regarding both of these issues.  Call placed to [facility] on [address]; Pharmacist stated the patient had her flu shot (high dose) and Tdap in august. There is no record of prevnar being given there and she checked back to 2010.

## 2015-05-02 NOTE — Telephone Encounter (Signed)
Dr. Doug Sou; I outreached this patient to discuss AWV; stated that she had rec'd letter from [place] or company asking her to come in for AAA screens / carotid / etc. She will have to pay oop and these are "community supported" screening but was not physician ordered. She would like for you to advise if these are necessary for her as she will pay out of pocket.   [name]

## 2015-05-03 ENCOUNTER — Telehealth: Payer: Self-pay

## 2015-05-03 NOTE — Telephone Encounter (Signed)
fup to [name]. Referred to Dr. Doug Sou to discuss Life screening for carotids; aaa and other. Also explained that she did not have a Prevnar, but a Tetanus and flu shot.  Stated she will fup with her insurance company for payment;  Will ask insurance company about payment for AWV.

## 2015-05-21 ENCOUNTER — Telehealth: Payer: Self-pay | Admitting: Obstetrics & Gynecology

## 2015-05-21 NOTE — Telephone Encounter (Signed)
Spoke with patient. She states that for several days she has noticed dysuria only at the end of her urine stream. She states she has been using incontinence pads and wearing poise pads at the same time and voiding at night when she is not aware. She denies any fevers, chills, nausea or vomiting. She states she feels well other than the pain at the end of the urine stream. Patient advised office visit indicated. Wishes only to see Dr. Sabra Heck. Scheduled office visit for tomorrow at 1430 with Dr. Sabra Heck. Advised if any worsening symptoms or develops fevers, chills, nausea, vomiting, confusion or any concerning symptom to not wait to be seen and go to nearest emergency room. Patient verbalized understanding and will call back with any concerns. Routing to provider for final review. Patient agreeable to disposition. Will close encounter.

## 2015-05-21 NOTE — Telephone Encounter (Signed)
Patient is having some urinary problems. Patient is asking is may "come in and just leave a sample".Last seen 12/20/14.

## 2015-05-22 ENCOUNTER — Ambulatory Visit (INDEPENDENT_AMBULATORY_CARE_PROVIDER_SITE_OTHER): Payer: Medicare Other | Admitting: Obstetrics & Gynecology

## 2015-05-22 ENCOUNTER — Encounter: Payer: Self-pay | Admitting: Obstetrics & Gynecology

## 2015-05-22 VITALS — BP 120/80 | HR 80 | Resp 20 | Wt 162.0 lb

## 2015-05-22 DIAGNOSIS — N3001 Acute cystitis with hematuria: Secondary | ICD-10-CM

## 2015-05-22 LAB — POCT URINALYSIS DIPSTICK
BILIRUBIN UA: NEGATIVE
Glucose, UA: NEGATIVE
Ketones, UA: NEGATIVE
NITRITE UA: NEGATIVE
PH UA: 5
UROBILINOGEN UA: NEGATIVE

## 2015-05-22 MED ORDER — SULFAMETHOXAZOLE-TRIMETHOPRIM 800-160 MG PO TABS: 1.0000 | ORAL_TABLET | Freq: Two times a day (BID) | ORAL | Status: DC

## 2015-05-22 NOTE — Addendum Note (Signed)
Addended by: Robley Fries on: 05/22/2015 03:41 PM   Modules accepted: Orders

## 2015-05-22 NOTE — Progress Notes (Signed)
S:  79 y.o. G4P2 Married Caucasian female presents with complaint of UTI. Symptoms began on 05/19/15.  She reports increased pain with urination but specifically at the end of urination.  She reports today her symptoms are much worse.  no fever.  She has no new back pain but does typically have lower left sided back pain.  No blood in her urine.  Pt having increased incontinence the last three days as well as increased urgency  She's heading out of town to [place] this weekend so she wants to have this treated if possible.    Not sexually active.  No vaginal bleeding or discharge.    ROS: no chills, no fever, no back pain, no fever.  O Gen:  obese and  not in acute distress  Abd:  Soft, ND, ND, no suprapubic tendernss  Back:  No CVA tenderness  Gyn:  NAEFG, vaginal atrophic, no lesions, no masses  Cath u/a performed under sterile conditions as pt could not give urine sample  Assessment:  Urinary tract infection Microscopic hematuria    Plan: Urine micro and culture pending Bactrim DS bid x 5 days.  #10/0RF.

## 2015-05-23 LAB — URINALYSIS, MICROSCOPIC ONLY
CASTS: NONE SEEN [LPF]
Crystals: NONE SEEN [HPF]
SQUAMOUS EPITHELIAL / LPF: NONE SEEN [HPF] (ref <=5)
YEAST: NONE SEEN [HPF]

## 2015-05-25 ENCOUNTER — Telehealth: Payer: Self-pay

## 2015-05-25 LAB — URINE CULTURE: Colony Count: >=100000

## 2015-05-25 NOTE — Telephone Encounter (Signed)
Left detailed message to call back to discuss results, but to complete medication.//kn

## 2015-05-25 NOTE — Addendum Note (Signed)
Addended by: Megan Salon on: 05/25/2015 01:02 PM   Modules accepted: Orders

## 2015-05-25 NOTE — Telephone Encounter (Signed)
-----   Message from Megan Salon, MD sent at 05/25/2015  1:01 PM EDT ----- Inform pt that the urine culture showed kelbsiella.  It is sensitive to the bactrim.  She should take it all.  I do want her to return for repeat culture in about two weeks.  Order placed.

## 2015-05-28 NOTE — Telephone Encounter (Signed)
Called husband's cell # back-mailbox is full, unable to leave message.//kn

## 2015-05-28 NOTE — Telephone Encounter (Signed)
Returned call

## 2015-05-29 NOTE — Telephone Encounter (Signed)
Lmtcb//kn 

## 2015-05-31 NOTE — Telephone Encounter (Signed)
Left detailed message, ok per DPR, will need to schedule a 2 week TOC appointment.//kn

## 2015-06-01 NOTE — Telephone Encounter (Signed)
Patient is returning a call to [facility] for results.

## 2015-06-04 NOTE — Telephone Encounter (Signed)
Patient returning call.

## 2015-06-05 NOTE — Telephone Encounter (Signed)
Patient notified of results. Follow up appointment made.//kn 

## 2015-06-08 ENCOUNTER — Ambulatory Visit (INDEPENDENT_AMBULATORY_CARE_PROVIDER_SITE_OTHER): Payer: Medicare Other | Admitting: *Deleted

## 2015-06-08 VITALS — BP 120/76 | HR 60 | Resp 14 | Wt 165.0 lb

## 2015-06-08 DIAGNOSIS — N390 Urinary tract infection, site not specified: Secondary | ICD-10-CM

## 2015-06-08 DIAGNOSIS — N3001 Acute cystitis with hematuria: Secondary | ICD-10-CM

## 2015-06-08 NOTE — Progress Notes (Signed)
Patient ID: [name], female   DOB: Jan 03, 1936, 79 y.o.   MRN: [id number] Patient here for TOC urine. She is feeling much better. Urine culture collected.

## 2015-06-10 LAB — URINE CULTURE

## 2015-08-28 ENCOUNTER — Telehealth: Payer: Self-pay | Admitting: Internal Medicine

## 2015-08-28 DIAGNOSIS — L989 Disorder of the skin and subcutaneous tissue, unspecified: Secondary | ICD-10-CM

## 2015-08-28 NOTE — Telephone Encounter (Signed)
Referral placed.

## 2015-08-28 NOTE — Telephone Encounter (Signed)
Pt has fingernail worts and they seem to be getting worse. She is wondering if you can refer her to a nail & hand specialist Please advise She can be reached at [phone]

## 2015-09-03 ENCOUNTER — Telehealth: Payer: Self-pay | Admitting: Internal Medicine

## 2015-09-03 ENCOUNTER — Other Ambulatory Visit: Payer: Self-pay | Admitting: Geriatric Medicine

## 2015-09-03 MED ORDER — NADOLOL 40 MG PO TABS: 40.0000 mg | ORAL_TABLET | Freq: Every day | ORAL | Status: DC

## 2015-09-03 NOTE — Telephone Encounter (Signed)
Sent to pharmacy 

## 2015-09-03 NOTE — Telephone Encounter (Signed)
[facility] on [address] called regarding pt is going out of town and is needing prescription for nadolol (CORGARD) 40 MG tablet [id number]  She does have an appointment for the end of Feb

## 2015-10-08 ENCOUNTER — Ambulatory Visit (INDEPENDENT_AMBULATORY_CARE_PROVIDER_SITE_OTHER): Payer: Medicare Other | Admitting: Internal Medicine

## 2015-10-08 ENCOUNTER — Other Ambulatory Visit (INDEPENDENT_AMBULATORY_CARE_PROVIDER_SITE_OTHER): Payer: Medicare Other

## 2015-10-08 ENCOUNTER — Encounter: Payer: Self-pay | Admitting: Internal Medicine

## 2015-10-08 VITALS — BP 136/86 | HR 67 | Temp 98.3°F | Resp 18 | Ht 62.0 in | Wt 164.0 lb

## 2015-10-08 DIAGNOSIS — Z23 Encounter for immunization: Secondary | ICD-10-CM | POA: Diagnosis not present

## 2015-10-08 DIAGNOSIS — M549 Dorsalgia, unspecified: Secondary | ICD-10-CM

## 2015-10-08 DIAGNOSIS — G44319 Acute post-traumatic headache, not intractable: Secondary | ICD-10-CM

## 2015-10-08 DIAGNOSIS — E785 Hyperlipidemia, unspecified: Secondary | ICD-10-CM

## 2015-10-08 LAB — COMPREHENSIVE METABOLIC PANEL
ALT: 13 U/L (ref 0–35)
AST: 16 U/L (ref 0–37)
Albumin: 4.2 g/dL (ref 3.5–5.2)
Alkaline Phosphatase: 58 U/L (ref 39–117)
BILIRUBIN TOTAL: 0.5 mg/dL (ref 0.2–1.2)
BUN: 14 mg/dL (ref 6–23)
CHLORIDE: 103 meq/L (ref 96–112)
CO2: 32 meq/L (ref 19–32)
CREATININE: 0.71 mg/dL (ref 0.40–1.20)
Calcium: 9.5 mg/dL (ref 8.4–10.5)
GFR: 84.62 mL/min (ref >60.00)
GLUCOSE: 81 mg/dL (ref 70–99)
Potassium: 4.4 mEq/L (ref 3.5–5.1)
SODIUM: 140 meq/L (ref 135–145)
Total Protein: 6.9 g/dL (ref 6.0–8.3)

## 2015-10-08 LAB — LIPID PANEL
CHOL/HDL RATIO: 3
Cholesterol: 146 mg/dL (ref 0–200)
HDL: 44.9 mg/dL (ref >39.00)
NONHDL: 100.62
TRIGLYCERIDES: 201 mg/dL — AB (ref 0.0–149.0)
VLDL: 40.2 mg/dL — ABNORMAL HIGH (ref 0.0–40.0)

## 2015-10-08 LAB — LDL CHOLESTEROL, DIRECT: Direct LDL: 78 mg/dL

## 2015-10-08 LAB — HEMOGLOBIN A1C: HEMOGLOBIN A1C: 5.6 % (ref 4.6–6.5)

## 2015-10-08 MED ORDER — CARBAMIDE PEROXIDE 6.5 % OT SOLN: 5.0000 [drp] | Freq: Two times a day (BID) | OTIC | Status: DC

## 2015-10-08 NOTE — Patient Instructions (Signed)
We will get you in with the back doctor for the pain.   We are checking the labs today and will call you back with the results.   We have sent in the ear drops to soften the wax called debrox. Use 2-3 drops per ear twice daily for the next 3-4 days to help the wax clear out.   We would like you to return with our health coach for a full wellness visit in the next several months.   We will get the scan of the head for the headaches.

## 2015-10-08 NOTE — Progress Notes (Signed)
Pre visit review using our clinic review tool, if applicable. No additional management support is needed unless otherwise documented below in the visit note. 

## 2015-10-11 ENCOUNTER — Encounter: Payer: Self-pay | Admitting: Internal Medicine

## 2015-10-11 DIAGNOSIS — M549 Dorsalgia, unspecified: Secondary | ICD-10-CM | POA: Insufficient documentation

## 2015-10-11 DIAGNOSIS — G44309 Post-traumatic headache, unspecified, not intractable: Secondary | ICD-10-CM

## 2015-10-11 HISTORY — DX: Post-traumatic headache, unspecified, not intractable: G44.309

## 2015-10-11 NOTE — Assessment & Plan Note (Signed)
CT head to rule out slow bleed. No neurological changes but continued headaches. No lesions on the scalp. Denies LOC with injury.

## 2015-10-11 NOTE — Progress Notes (Signed)
   Subjective:    Patient ID: [name], female    DOB: [birth year], 80 y.o.   MRN: [id number]  HPI The patient is a 80 YO female coming in for lower back pain. Has been giving her trouble for many years. She has a friend to recently get treatment and wonders if something could help her. Some rare pains in her legs but mostly in the low back. Worse with standing or walking and improves with rest. She has also had fall in the last 1-2 weeks and hit her head. She is still having headaches. Denies nausea or vomiting. Denies LOC at the time of the fall. Did not seek care at the time.   Review of Systems  Constitutional: Negative for fever, activity change, appetite change, fatigue and unexpected weight change.  Eyes: Negative.   Respiratory: Negative for cough, chest tightness, shortness of breath and wheezing.   Cardiovascular: Negative for chest pain, palpitations and leg swelling.  Gastrointestinal: Negative for nausea, abdominal pain, diarrhea, constipation and abdominal distention.  Musculoskeletal: Positive for back pain and arthralgias. Negative for myalgias and gait problem.  Skin: Negative.   Neurological: Positive for headaches. Negative for dizziness, weakness and numbness.  Psychiatric/Behavioral: Negative.       Objective:   Physical Exam  Constitutional: She is oriented to person, place, and time. She appears well-developed and well-nourished.  HENT:  Head: Normocephalic and atraumatic.  Eyes: EOM are normal.  Neck: Normal range of motion.  No temporal tenderness  Cardiovascular: Normal rate and regular rhythm.   Pulmonary/Chest: Effort normal and breath sounds normal. No respiratory distress. She has no wheezes. She has no rales.  Abdominal: Soft. Bowel sounds are normal. She exhibits no distension. There is no tenderness. There is no rebound.  Musculoskeletal: She exhibits no edema.  Neurological: She is alert and oriented to person, place, and time. Coordination  normal.  Skin: Skin is warm and dry.  Psychiatric: She has a normal mood and affect. Her behavior is normal.   Filed Vitals:   10/08/15 0937  BP: 136/86  Pulse: 67  Temp: 98.3 F (36.8 C)  TempSrc: Oral  Resp: 18  Height: 5\' 2"  (1.575 m)  Weight: 164 lb (74.39 kg)  SpO2: 98%      Assessment & Plan:  Prevnar 13 given at visit.

## 2015-10-11 NOTE — Assessment & Plan Note (Signed)
Referral to neurosurgery and encouraged her to continue using heat and tylenol for pain. Given some back exercises to help with the muscles in the meantime.

## 2015-10-12 ENCOUNTER — Ambulatory Visit
Admission: RE | Admit: 2015-10-12 | Discharge: 2015-10-12 | Disposition: A | Discharge status: Home / Self Care | Payer: Medicare Other | Source: Ambulatory Visit | Attending: Internal Medicine | Admitting: Internal Medicine

## 2015-10-12 DIAGNOSIS — G44319 Acute post-traumatic headache, not intractable: Secondary | ICD-10-CM

## 2015-10-19 ENCOUNTER — Encounter (HOSPITAL_COMMUNITY): Payer: Self-pay

## 2015-10-19 ENCOUNTER — Emergency Department (HOSPITAL_COMMUNITY): Payer: Medicare Other

## 2015-10-19 ENCOUNTER — Emergency Department (HOSPITAL_COMMUNITY)
Admission: EM | Admit: 2015-10-19 | Discharge: 2015-10-20 | Disposition: A | Discharge status: Home / Self Care | Payer: Medicare Other | Attending: Emergency Medicine | Admitting: Emergency Medicine

## 2015-10-19 DIAGNOSIS — F329 Major depressive disorder, single episode, unspecified: Secondary | ICD-10-CM | POA: Diagnosis not present

## 2015-10-19 DIAGNOSIS — Z872 Personal history of diseases of the skin and subcutaneous tissue: Secondary | ICD-10-CM | POA: Insufficient documentation

## 2015-10-19 DIAGNOSIS — Z8719 Personal history of other diseases of the digestive system: Secondary | ICD-10-CM | POA: Diagnosis not present

## 2015-10-19 DIAGNOSIS — Z8742 Personal history of other diseases of the female genital tract: Secondary | ICD-10-CM | POA: Diagnosis not present

## 2015-10-19 DIAGNOSIS — S199XXA Unspecified injury of neck, initial encounter: Secondary | ICD-10-CM | POA: Insufficient documentation

## 2015-10-19 DIAGNOSIS — Z7982 Long term (current) use of aspirin: Secondary | ICD-10-CM | POA: Insufficient documentation

## 2015-10-19 DIAGNOSIS — Y9289 Other specified places as the place of occurrence of the external cause: Secondary | ICD-10-CM | POA: Diagnosis not present

## 2015-10-19 DIAGNOSIS — W01198A Fall on same level from slipping, tripping and stumbling with subsequent striking against other object, initial encounter: Secondary | ICD-10-CM | POA: Diagnosis not present

## 2015-10-19 DIAGNOSIS — M1990 Unspecified osteoarthritis, unspecified site: Secondary | ICD-10-CM | POA: Insufficient documentation

## 2015-10-19 DIAGNOSIS — E7800 Pure hypercholesterolemia, unspecified: Secondary | ICD-10-CM | POA: Insufficient documentation

## 2015-10-19 DIAGNOSIS — H409 Unspecified glaucoma: Secondary | ICD-10-CM | POA: Insufficient documentation

## 2015-10-19 DIAGNOSIS — Z87891 Personal history of nicotine dependence: Secondary | ICD-10-CM | POA: Diagnosis not present

## 2015-10-19 DIAGNOSIS — S0101XA Laceration without foreign body of scalp, initial encounter: Secondary | ICD-10-CM | POA: Insufficient documentation

## 2015-10-19 DIAGNOSIS — Y9301 Activity, walking, marching and hiking: Secondary | ICD-10-CM | POA: Diagnosis not present

## 2015-10-19 DIAGNOSIS — Z87440 Personal history of urinary (tract) infections: Secondary | ICD-10-CM | POA: Insufficient documentation

## 2015-10-19 DIAGNOSIS — Y998 Other external cause status: Secondary | ICD-10-CM | POA: Insufficient documentation

## 2015-10-19 DIAGNOSIS — Z86010 Personal history of colonic polyps: Secondary | ICD-10-CM | POA: Insufficient documentation

## 2015-10-19 DIAGNOSIS — Z79899 Other long term (current) drug therapy: Secondary | ICD-10-CM | POA: Insufficient documentation

## 2015-10-19 DIAGNOSIS — I10 Essential (primary) hypertension: Secondary | ICD-10-CM | POA: Insufficient documentation

## 2015-10-19 DIAGNOSIS — S0990XA Unspecified injury of head, initial encounter: Secondary | ICD-10-CM | POA: Diagnosis present

## 2015-10-19 MED ORDER — IBUPROFEN 200 MG PO TABS
600.0000 mg | ORAL_TABLET | Freq: Once | ORAL | Status: AC
  Administered 2015-10-19: 600 mg via ORAL
  Filled 2015-10-19: qty 3

## 2015-10-19 MED ORDER — ONDANSETRON HCL 4 MG/2ML IJ SOLN
4.0000 mg | Freq: Once | INTRAMUSCULAR | Status: AC
  Administered 2015-10-19: 4 mg via INTRAVENOUS
  Filled 2015-10-19: qty 2

## 2015-10-19 MED ORDER — LIDOCAINE-EPINEPHRINE (PF) 2 %-1:200000 IJ SOLN
10.0000 mL | Freq: Once | INTRAMUSCULAR | Status: AC
  Administered 2015-10-19: 10 mL

## 2015-10-19 NOTE — ED Provider Notes (Signed)
CSN: [id number]     Arrival date & time 10/19/15  2155 History   First MD Initiated Contact with Patient 10/19/15 2156     Chief Complaint  Patient presents with  . Fall     (Consider location/radiation/quality/duration/timing/severity/associated sxs/prior Treatment) Patient is a 80 y.o. female presenting with fall. The history is provided by the patient.  Fall This is a new (Patient was at a concert and she was walking out she tripped falling backwards and hitting her head.) problem. The current episode started less than 1 hour ago. The problem occurs constantly. The problem has not changed since onset.Associated symptoms include headaches. Associated symptoms comments: Nausea. No LOC, vision change, weakness or numbness. No anticoagulation use.. Nothing aggravates the symptoms. Nothing relieves the symptoms. Treatments tried: Given Zofran by EMS for nausea without improvement. The treatment provided no relief.    Past Medical History  Diagnosis Date  . Colon polyps   . Depression   . GERD (gastroesophageal reflux disease)   . Osteoarthritis   . Hx: UTI (urinary tract infection)   . Stress incontinence, female   . Hypertension   . Whooping cough   . Macular degeneration   . Glaucoma   . Complication of anesthesia     takes longer to wake up- admitted overnight after colonoscopy  . Hypercholesteremia   . Osteopenia     of the hip  . Lichen simplex chronicus    Past Surgical History  Procedure Laterality Date  . Bladder repair      x two  . Mohs surgery      procedure to remove basal cell  . Vertebroplasty secondary to traumatic compression fracture    . Cholecystectomy    . Abdominal hysterectomy      ovaries remain  . Tonsillectomy and adenoidectomy    . Appendectomy    . Urethral sling  2007  . Urethral sling  1/12    midurethral   . Lesion removal Right 06/28/2013    Procedure: EXCISION 3 cm right labial sebaceous cyst;  Surgeon: Lyman Speller, MD;   Location: [facility];  Service: Gynecology;  Laterality: Right;  . Eye surgery  11/14, 12/14    Macular Dengeneration, Glaucoma  . Eye surgery  2016    left eye   Family History  Problem Relation Age of Onset  . Breast cancer Maternal Grandmother   . Hypertension Sister   . Congestive Heart Failure Mother   . Thyroid disease Mother   . Thyroid disease Sister   . Osteoporosis Mother   . Rheum arthritis Daughter   . Alcoholism Mother    Social History  Substance Use Topics  . Smoking status: Former Smoker    Quit date: 08/11/1965  . Smokeless tobacco: Never Used  . Alcohol Use: 0.6 oz/week    1 Standard drinks or equivalent per week     Comment: occ    OB History    Gravida Para Term Preterm AB TAB SAB Ectopic Multiple Living   4 2        2      Review of Systems  Neurological: Positive for headaches.  All other systems reviewed and are negative.     Allergies  Codeine  Home Medications   Prior to Admission medications   Medication Sig Start Date End Date Taking? Authorizing Provider  Acetaminophen (TYLENOL PO) Take by mouth as needed.    Historical Provider, MD  ALPRAZolam (XANAX) 0.5 MG tablet TAKE 1 TABLET BY MOUTH  EVERY 6 HOURS AS NEEDED FOR ANXIETY    Neena Rhymes, MD  aspirin 81 MG tablet Take 81 mg by mouth daily.     Historical Provider, MD  carbamide peroxide (DEBROX) 6.5 % otic solution Place 5 drops into both ears 2 (two) times daily. 10/08/15   Hoyt Koch, MD  clobetasol ointment (TEMOVATE) 0.05 % Apply topically 2 (two) times daily. Use for no more than 14 days 12/11/14   Megan Salon, MD  COMBIGAN 0.2-0.5 % ophthalmic solution  09/05/14   Historical Provider, MD  ezetimibe-simvastatin (VYTORIN) 10-20 MG per tablet Take 1 tablet by mouth daily. 04/11/15   Hoyt Koch, MD  latanoprost (XALATAN) 0.005 % ophthalmic solution  09/05/14   Historical Provider, MD  Menthol, Topical Analgesic, (BIOFREEZE ROLL-ON) 4 % GEL Apply to area twice a day as  needed 03/31/13   Neena Rhymes, MD  nadolol (CORGARD) 40 MG tablet Take 1 tablet (40 mg total) by mouth at bedtime. 09/03/15   Hoyt Koch, MD  traZODone (DESYREL) 100 MG tablet Take 25 mg by mouth at bedtime.    Historical Provider, MD  venlafaxine (EFFEXOR-XR) 150 MG 24 hr capsule Take 150 mg by mouth 2 (two) times daily.      Historical Provider, MD   BP 147/76 mmHg  Pulse 63  Temp(Src) 97.5 F (36.4 C) (Oral)  Resp 16  Ht 5\' 2"  (1.575 m)  Wt 161 lb 14.4 oz (73.437 kg)  BMI 29.60 kg/m2  SpO2 96%  LMP 08/11/1974 Physical Exam  Constitutional: She is oriented to person, place, and time. She appears well-developed and well-nourished. No distress.  HENT:  Head: Normocephalic. Head is with laceration.    Mouth/Throat: Oropharynx is clear and moist.  Eyes: Conjunctivae and EOM are normal. Pupils are equal, round, and reactive to light.  Neck: Normal range of motion. Neck supple. Spinous process tenderness present.    Cardiovascular: Normal rate, regular rhythm and intact distal pulses.   No murmur heard. Pulmonary/Chest: Effort normal and breath sounds normal. No respiratory distress. She has no wheezes. She has no rales. She exhibits no tenderness.  Abdominal: Soft. She exhibits no distension. There is no tenderness. There is no rebound and no guarding.  Musculoskeletal: Normal range of motion. She exhibits no edema or tenderness.  Neurological: She is alert and oriented to person, place, and time. She has normal strength. No sensory deficit.  Skin: Skin is warm and dry. No rash noted. No erythema.  Psychiatric: She has a normal mood and affect. Her behavior is normal.  Nursing note and vitals reviewed.   ED Course  Procedures (including critical care time) Labs Review Labs Reviewed - No data to display  Imaging Review Ct Head Wo Contrast  10/19/2015  CLINICAL DATA:  Fall striking back of head on concrete. No loss of consciousness. EXAM: CT HEAD WITHOUT CONTRAST  CT CERVICAL SPINE WITHOUT CONTRAST TECHNIQUE: Multidetector CT imaging of the head and cervical spine was performed following the standard protocol without intravenous contrast. Multiplanar CT image reconstructions of the cervical spine were also generated. COMPARISON:  Head CT 10/12/2015 FINDINGS: CT HEAD FINDINGS No intracranial hemorrhage, mass effect, or midline shift. The degree of atrophy and chronic small vessel ischemia is unchanged. No hydrocephalus. The basilar cisterns are patent. No evidence of territorial infarct. No intracranial fluid collection. Vertex scalp hematoma, eccentric to the right. No subjacent fracture. Calvarium is intact. Included paranasal sinuses and mastoid air cells are well aerated. CT CERVICAL  SPINE FINDINGS No acute fracture or subluxation. The dens is intact. There are no jumped or perched facets. Degenerative disc disease with disc space narrowing and endplate spurring from D34-534 through C6-C7. Scattered multilevel facet arthropathy. No prevertebral soft tissue edema. IMPRESSION: 1. Vertex scalp hematoma without subjacent fracture or acute intracranial abnormality. 2. Degenerative change in the cervical spine without acute fracture or subluxation. Electronically Signed   By: Jeb Levering M.D.   On: 10/19/2015 23:32   Ct Cervical Spine Wo Contrast  10/19/2015  CLINICAL DATA:  Fall striking back of head on concrete. No loss of consciousness. EXAM: CT HEAD WITHOUT CONTRAST CT CERVICAL SPINE WITHOUT CONTRAST TECHNIQUE: Multidetector CT imaging of the head and cervical spine was performed following the standard protocol without intravenous contrast. Multiplanar CT image reconstructions of the cervical spine were also generated. COMPARISON:  Head CT 10/12/2015 FINDINGS: CT HEAD FINDINGS No intracranial hemorrhage, mass effect, or midline shift. The degree of atrophy and chronic small vessel ischemia is unchanged. No hydrocephalus. The basilar cisterns are patent. No evidence of  territorial infarct. No intracranial fluid collection. Vertex scalp hematoma, eccentric to the right. No subjacent fracture. Calvarium is intact. Included paranasal sinuses and mastoid air cells are well aerated. CT CERVICAL SPINE FINDINGS No acute fracture or subluxation. The dens is intact. There are no jumped or perched facets. Degenerative disc disease with disc space narrowing and endplate spurring from D34-534 through C6-C7. Scattered multilevel facet arthropathy. No prevertebral soft tissue edema. IMPRESSION: 1. Vertex scalp hematoma without subjacent fracture or acute intracranial abnormality. 2. Degenerative change in the cervical spine without acute fracture or subluxation. Electronically Signed   By: Jeb Levering M.D.   On: 10/19/2015 23:32   I have personally reviewed and evaluated these images and lab results as part of my medical decision-making.   EKG Interpretation   Date/Time:  Friday October 19 2015 22:12:29 EST Ventricular Rate:  61 PR Interval:  189 QRS Duration: 77 QT Interval:  416 QTC Calculation: 419 R Axis:   -7 Text Interpretation:  Sinus rhythm Low voltage, precordial leads LVH by  voltage No significant change since last tracing Confirmed by Maryan Rued   MD, Loree Fee ([id number]) on 10/19/2015 10:26:46 PM      LACERATION REPAIR Performed by: Blanchie Dessert Authorized byBlanchie Dessert Consent: Verbal consent obtained. Risks and benefits: risks, benefits and alternatives were discussed Consent given by: patient Patient identity confirmed: provided demographic data Prepped and Draped in normal sterile fashion Wound explored  Laceration Location: scalp  Laceration Length: 2cm  No Foreign Bodies seen or palpated  Anesthesia: local infiltration  Local anesthetic: lidocaine 2% with epinephrine  Anesthetic total: 3 ml  Irrigation method: syringe Amount of cleaning: standard  Skin closure: staples  Number of sutures: 4   Patient tolerance: Patient  tolerated the procedure well with no immediate complications.   MDM   Final diagnoses:  Scalp laceration, initial encounter    Patient is a 79 year old female presenting after a mechanical fall backwards onto concrete hitting her head. She denies any LOC but is complaining of posterior head, neck pain and nausea. Vision is intact and no neurologic findings on exam. No other areas of pain concerning for underlying injury. Tetanus shot is up-to-date. CT of the head and neck pending.  12:20 AM Imaging is negative and C-spine cleared. Wound repaired with hemostasis achieved.  Blanchie Dessert, MD 10/20/15 0021

## 2015-10-19 NOTE — ED Notes (Signed)
Patient transported to CT 

## 2015-10-19 NOTE — ED Notes (Signed)
Pt fell at a concert and hit the back of head on the concrete. No LOC oriented x4.

## 2015-10-20 MED ORDER — HYDROCODONE-ACETAMINOPHEN 5-325 MG PO TABS: 0.5000 | ORAL_TABLET | Freq: Four times a day (QID) | ORAL | Status: DC | PRN

## 2015-10-20 NOTE — ED Notes (Signed)
MD at bedside. 

## 2015-10-20 NOTE — Discharge Instructions (Signed)
Laceration Care, Adult  A laceration is a cut that goes through all layers of the skin. The cut also goes into the tissue that is right under the skin. Some cuts heal on their own. Others need to be closed with stitches (sutures), staples, skin adhesive strips, or wound glue. Taking care of your cut lowers your risk of infection and helps your cut to heal better.  HOW TO TAKE CARE OF YOUR CUT  For stitches or staples:  · Keep the wound clean and dry.  · If you were given a bandage (dressing), you should change it at least one time per day or as told by your doctor. You should also change it if it gets wet or dirty.  · Keep the wound completely dry for the first 24 hours or as told by your doctor. After that time, you may take a shower or a bath. However, make sure that the wound is not soaked in water until after the stitches or staples have been removed.  · Clean the wound one time each day or as told by your doctor:    Wash the wound with soap and water.    Rinse the wound with water until all of the soap comes off.    Pat the wound dry with a clean towel. Do not rub the wound.  · After you clean the wound, put a thin layer of antibiotic ointment on it as told by your doctor. This ointment:    Helps to prevent infection.    Keeps the bandage from sticking to the wound.  · Have your stitches or staples removed as told by your doctor.  If your doctor used skin adhesive strips:   · Keep the wound clean and dry.  · If you were given a bandage, you should change it at least one time per day or as told by your doctor. You should also change it if it gets dirty or wet.  · Do not get the skin adhesive strips wet. You can take a shower or a bath, but be careful to keep the wound dry.  · If the wound gets wet, pat it dry with a clean towel. Do not rub the wound.  · Skin adhesive strips fall off on their own. You can trim the strips as the wound heals. Do not remove any strips that are still stuck to the wound. They will  fall off after a while.  If your doctor used wound glue:  · Try to keep your wound dry, but you may briefly wet it in the shower or bath. Do not soak the wound in water, such as by swimming.  · After you take a shower or a bath, gently pat the wound dry with a clean towel. Do not rub the wound.  · Do not do any activities that will make you really sweaty until the skin glue has fallen off on its own.  · Do not apply liquid, cream, or ointment medicine to your wound while the skin glue is still on.  · If you were given a bandage, you should change it at least one time per day or as told by your doctor. You should also change it if it gets dirty or wet.  · If a bandage is placed over the wound, do not let the tape for the bandage touch the skin glue.  · Do not pick at the glue. The skin glue usually stays on for 5-10 days. Then, it   falls off of the skin.  General Instructions   · To help prevent scarring, make sure to cover your wound with sunscreen whenever you are outside after stitches are removed, after adhesive strips are removed, or when wound glue stays in place and the wound is healed. Make sure to wear a sunscreen of at least 30 SPF.  · Take over-the-counter and prescription medicines only as told by your doctor.  · If you were given antibiotic medicine or ointment, take or apply it as told by your doctor. Do not stop using the antibiotic even if your wound is getting better.  · Do not scratch or pick at the wound.  · Keep all follow-up visits as told by your doctor. This is important.  · Check your wound every day for signs of infection. Watch for:    Redness, swelling, or pain.    Fluid, blood, or pus.  · Raise (elevate) the injured area above the level of your heart while you are sitting or lying down, if possible.  GET HELP IF:  · You got a tetanus shot and you have any of these problems at the injection site:    Swelling.    Very bad pain.    Redness.    Bleeding.  · You have a fever.  · A wound that was  closed breaks open.  · You notice a bad smell coming from your wound or your bandage.  · You notice something coming out of the wound, such as wood or glass.  · Medicine does not help your pain.  · You have more redness, swelling, or pain at the site of your wound.  · You have fluid, blood, or pus coming from your wound.  · You notice a change in the color of your skin near your wound.  · You need to change the bandage often because fluid, blood, or pus is coming from the wound.  · You start to have a new rash.  · You start to have numbness around the wound.  GET HELP RIGHT AWAY IF:  · You have very bad swelling around the wound.  · Your pain suddenly gets worse and is very bad.  · You notice painful lumps near the wound or on skin that is anywhere on your body.  · You have a red streak going away from your wound.  · The wound is on your hand or foot and you cannot move a finger or toe like you usually can.  · The wound is on your hand or foot and you notice that your fingers or toes look pale or bluish.     This information is not intended to replace advice given to you by your health care provider. Make sure you discuss any questions you have with your health care provider.     Document Released: 01/14/2008 Document Revised: 12/12/2014 Document Reviewed: 07/24/2014  Elsevier Interactive Patient Education ©2016 [employer]

## 2015-10-22 ENCOUNTER — Ambulatory Visit (INDEPENDENT_AMBULATORY_CARE_PROVIDER_SITE_OTHER): Payer: Medicare Other | Admitting: Internal Medicine

## 2015-10-22 ENCOUNTER — Encounter: Payer: Self-pay | Admitting: Internal Medicine

## 2015-10-22 VITALS — BP 148/74 | HR 60 | Temp 98.4°F | Resp 16 | Ht 62.0 in | Wt 164.0 lb

## 2015-10-22 DIAGNOSIS — R51 Headache: Secondary | ICD-10-CM | POA: Diagnosis not present

## 2015-10-22 DIAGNOSIS — R519 Headache, unspecified: Secondary | ICD-10-CM | POA: Insufficient documentation

## 2015-10-22 HISTORY — DX: Headache, unspecified: R51.9

## 2015-10-22 NOTE — Progress Notes (Signed)
Pre visit review using our clinic review tool, if applicable. No additional management support is needed unless otherwise documented below in the visit note. 

## 2015-10-22 NOTE — Progress Notes (Signed)
   Subjective:    Patient ID: [name], female    DOB: 1936/05/15, 80 y.o.   MRN: [id number]  HPI The patient is a 80 YO female coming in for scalp laceration check. She did it Friday and went to ER and got several staples and sutures. It was a jagged wound and they were not able to fully close it. She is keeping it clean but not able to use a bandage due to location and her hair. Still hurting and mildly swollen. CT head and lumbar were negative for injury or bleed. Denies LOC with fall.   Review of Systems  Constitutional: Negative for fever, activity change, appetite change, fatigue and unexpected weight change.  Eyes: Negative.   Respiratory: Negative for cough, chest tightness, shortness of breath and wheezing.   Cardiovascular: Negative for chest pain, palpitations and leg swelling.  Gastrointestinal: Negative for nausea, abdominal pain, diarrhea, constipation and abdominal distention.  Musculoskeletal: Positive for back pain and arthralgias. Negative for myalgias and gait problem.  Skin: Negative.   Neurological: Positive for headaches. Negative for dizziness, weakness and numbness.  Psychiatric/Behavioral: Negative.       Objective:   Physical Exam  Constitutional: She is oriented to person, place, and time. She appears well-developed and well-nourished.  HENT:  Head: Normocephalic and atraumatic.  Eyes: EOM are normal.  Neck: Normal range of motion.  No temporal tenderness  Cardiovascular: Normal rate and regular rhythm.   Pulmonary/Chest: Effort normal and breath sounds normal. No respiratory distress. She has no wheezes. She has no rales.  Abdominal: Soft. Bowel sounds are normal. She exhibits no distension. There is no tenderness. There is no rebound.  Musculoskeletal: She exhibits no edema.  Neurological: She is alert and oriented to person, place, and time. Coordination normal.  Skin: Skin is warm and dry.  Jagged wound on the posterior scalp, 4 staples one of  which is dangling. Tried to remove in the office but it is matted in her scab tissue so left in order to promote healing.   Psychiatric: She has a normal mood and affect. Her behavior is normal.   Filed Vitals:   10/22/15 1122  BP: 148/74  Pulse: 60  Temp: 98.4 F (36.9 C)  TempSrc: Oral  Resp: 16  Height: 5\' 2"  (1.575 m)  Weight: 164 lb (74.39 kg)  SpO2: 91%      Assessment & Plan:

## 2015-10-22 NOTE — Assessment & Plan Note (Signed)
Attempted removal of 1 staple but is matted in her hair and scab tissue. Recommended waiting until next Monday for removal and they will go to an urgent care near their house. No signs of infection and they have pain control. Use warm soapy water on the area once daily and keep clean and dry if possible.

## 2015-10-25 ENCOUNTER — Other Ambulatory Visit: Payer: Self-pay | Admitting: Internal Medicine

## 2015-10-25 DIAGNOSIS — M545 Low back pain: Secondary | ICD-10-CM

## 2015-10-29 ENCOUNTER — Encounter (HOSPITAL_COMMUNITY): Payer: Self-pay | Admitting: Emergency Medicine

## 2015-10-29 ENCOUNTER — Emergency Department (HOSPITAL_COMMUNITY)
Admission: EM | Admit: 2015-10-29 | Discharge: 2015-10-29 | Disposition: A | Discharge status: Home / Self Care | Payer: Medicare Other | Source: Home / Self Care | Attending: Family Medicine | Admitting: Family Medicine

## 2015-10-29 DIAGNOSIS — T8130XA Disruption of wound, unspecified, initial encounter: Secondary | ICD-10-CM

## 2015-10-29 DIAGNOSIS — Z4802 Encounter for removal of sutures: Secondary | ICD-10-CM | POA: Diagnosis not present

## 2015-10-29 MED ORDER — LIDOCAINE-EPINEPHRINE (PF) 2 %-1:200000 IJ SOLN
INTRAMUSCULAR | Status: AC
  Filled 2015-10-29: qty 20

## 2015-10-29 NOTE — ED Notes (Signed)
Pt here for staple removal to posterior scalp area s/p fall 10/19/15 Pt was seen and treated at [facility] ER Instructed to return in 7-10 days Soreness noted, incision well approx, intact Slight swelling noted

## 2015-10-29 NOTE — Discharge Instructions (Signed)
Incision Care An incision is when a surgeon cuts into your body. After surgery, the incision needs to be cared for properly to prevent infection.  HOW TO CARE FOR YOUR INCISION  Take medicines only as directed by your health care provider.  There are many different ways to close and cover an incision, including stitches, skin glue, and adhesive strips. Follow your health care provider's instructions on:  Incision care.  Bandage (dressing) changes and removal.  Incision closure removal.  Do not take baths, swim, or use a hot tub until your health care provider approves. You may shower as directed by your health care provider.  Resume your normal diet and activities as directed.  Use anti-itch medicine (such as an antihistamine) as directed by your health care provider. The incision may itch while it is healing. Do not pick or scratch at the incision.  Drink enough fluid to keep your urine clear or pale yellow. SEEK MEDICAL CARE IF:   You have drainage, redness, swelling, or pain at your incision site.  You have muscle aches, chills, or a general ill feeling.  You notice a bad smell coming from the incision or dressing.  Your incision edges separate after the sutures, staples, or skin adhesive strips have been removed.  You have persistent nausea or vomiting.  You have a fever.  You are dizzy. SEEK IMMEDIATE MEDICAL CARE IF:   You have a rash.  You faint.  You have difficulty breathing. MAKE SURE YOU:   Understand these instructions.  Will watch your condition.  Will get help right away if you are not doing well or get worse.   This information is not intended to replace advice given to you by your health care provider. Make sure you discuss any questions you have with your health care provider.   Document Released: 02/14/2005 Document Revised: 08/18/2014 Document Reviewed: 09/21/2013 Elsevier Interactive Patient Education 2016 [employer].  Wound  Dehiscence Wound dehiscence is when a surgical cut (incision) opens up and does not heal properly. It usually happens 7-10 days after surgery. You may have bleeding from the cut. You may also have pain or a fever. This condition should be treated early. HOME CARE  Only take medicines as told by your doctor.  Take your antibiotic medicine as told. Finish it even if you start to feel better.  Wash your wound with warm, soapy water 2 times a day, or as told. Pat the wound dry. Do not rub the wound.  Change bandages (dressings) as often as told. Wash your hands before and after changing bandages. Apply bandages as told.  Take showers. Do not soak the wound, bathe, swim, or use a hot tub until directed by your doctor.  Avoid exercises that make you sweat.  Use medicines that stop itching as told by your doctor. The wound may itch as it heals. Do not pick or scratch at the wound.  Do not lift more than 10 pounds (4.5 kilograms) until the wound is healed, or as told by your doctor.  Keep all doctor visits as told. GET HELP IF:  You have a lot of bleeding from your surgical cut.  Your wound does not seem to be healing right.  You have a fever. GET HELP RIGHT AWAY IF:  You have more puffiness (swelling) or redness around the wound.  You have more pain in the wound.  You have yellowish white fluid (pus) coming from the wound.  More of the wound breaks open. MAKE  SURE YOU:  Understand these instructions.  Will watch your condition.  Will get help right away if you are not doing well or get worse.   This information is not intended to replace advice given to you by your health care provider. Make sure you discuss any questions you have with your health care provider.   Document Released: 07/16/2009 Document Revised: 08/18/2014 Document Reviewed: 04/04/2013 Elsevier Interactive Patient Education [employer].

## 2015-10-29 NOTE — ED Provider Notes (Signed)
CSN: [id number]     Arrival date & time 10/29/15  1340 History   First MD Initiated Contact with Patient 10/29/15 1515     Chief Complaint  Patient presents with  . Suture / Staple Removal   (Consider location/radiation/quality/duration/timing/severity/associated sxs/prior Treatment) HPI Comments: 80 year old female had fallen approximately 10 days ago and presented to the emergency department at [facility] for repair of a scalp laceration. The laceration was repaired with 4 staples. The very anterior staples were very close together nearly touching. The 4 staples approximately one and half centimeters posterior. The anterior staples were removed quite easily with a staple remover however the staple had been manipulated and/or twisted and such a way as to produce a circular metal structure that could not be removed with the staple remover's. Attempted to contact the medical with heavy scissors that would not cut through them.   Past Medical History  Diagnosis Date  . Colon polyps   . Depression   . GERD (gastroesophageal reflux disease)   . Osteoarthritis   . Hx: UTI (urinary tract infection)   . Stress incontinence, female   . Hypertension   . Whooping cough   . Macular degeneration   . Glaucoma   . Complication of anesthesia     takes longer to wake up- admitted overnight after colonoscopy  . Hypercholesteremia   . Osteopenia     of the hip  . Lichen simplex chronicus    Past Surgical History  Procedure Laterality Date  . Bladder repair      x two  . Mohs surgery      procedure to remove basal cell  . Vertebroplasty secondary to traumatic compression fracture    . Cholecystectomy    . Abdominal hysterectomy      ovaries remain  . Tonsillectomy and adenoidectomy    . Appendectomy    . Urethral sling  2007  . Urethral sling  1/12    midurethral   . Lesion removal Right 06/28/2013    Procedure: EXCISION 3 cm right labial sebaceous cyst;  Surgeon: Lyman Speller, MD;   Location: [facility];  Service: Gynecology;  Laterality: Right;  . Eye surgery  11/14, 12/14    Macular Dengeneration, Glaucoma  . Eye surgery  2016    left eye   Family History  Problem Relation Age of Onset  . Breast cancer Maternal Grandmother   . Hypertension Sister   . Congestive Heart Failure Mother   . Thyroid disease Mother   . Thyroid disease Sister   . Osteoporosis Mother   . Rheum arthritis Daughter   . Alcoholism Mother    Social History  Substance Use Topics  . Smoking status: Former Smoker    Quit date: 08/11/1965  . Smokeless tobacco: Never Used  . Alcohol Use: 0.6 oz/week    1 Standard drinks or equivalent per week     Comment: occ    OB History    Gravida Para Term Preterm AB TAB SAB Ectopic Multiple Living   4 2        2      Review of Systems  Constitutional: Negative.   Skin: Positive for wound.  Neurological: Negative.   All other systems reviewed and are negative.   Allergies  Codeine  Home Medications   Prior to Admission medications   Medication Sig Start Date End Date Taking? Authorizing Provider  Acetaminophen (TYLENOL PO) Take by mouth as needed.    Historical Provider, MD  ALPRAZolam Duanne Moron)  0.5 MG tablet TAKE 1 TABLET BY MOUTH EVERY 6 HOURS AS NEEDED FOR ANXIETY    Neena Rhymes, MD  aspirin 81 MG tablet Take 81 mg by mouth daily.     Historical Provider, MD  carbamide peroxide (DEBROX) 6.5 % otic solution Place 5 drops into both ears 2 (two) times daily. 10/08/15   Hoyt Koch, MD  clobetasol ointment (TEMOVATE) 0.05 % Apply topically 2 (two) times daily. Use for no more than 14 days 12/11/14   Megan Salon, MD  COMBIGAN 0.2-0.5 % ophthalmic solution  09/05/14   Historical Provider, MD  ezetimibe-simvastatin (VYTORIN) 10-20 MG per tablet Take 1 tablet by mouth daily. 04/11/15   Hoyt Koch, MD  HYDROcodone-acetaminophen (NORCO/VICODIN) 5-325 MG tablet Take 0.5-1 tablets by mouth every 6 (six) hours as needed for severe  pain. 10/20/15   Blanchie Dessert, MD  latanoprost (XALATAN) 0.005 % ophthalmic solution  09/05/14   Historical Provider, MD  Menthol, Topical Analgesic, (BIOFREEZE ROLL-ON) 4 % GEL Apply to area twice a day as needed 03/31/13   Neena Rhymes, MD  nadolol (CORGARD) 40 MG tablet Take 1 tablet (40 mg total) by mouth at bedtime. 09/03/15   Hoyt Koch, MD  traZODone (DESYREL) 100 MG tablet Take 25 mg by mouth at bedtime. Reported on 10/22/2015    Historical Provider, MD  venlafaxine (EFFEXOR-XR) 150 MG 24 hr capsule Take 150 mg by mouth 2 (two) times daily.      Historical Provider, MD   Meds Ordered and Administered this Visit  Medications - No data to display  BP 129/80 mmHg  Pulse 63  Temp(Src) 98.1 F (36.7 C) (Oral)  Resp 16  SpO2 96%  LMP 08/11/1974 No data found.   Physical Exam  Constitutional: She is oriented to person, place, and time. She appears well-developed and well-nourished. No distress.  Eyes: EOM are normal.  Neck: Normal range of motion. Neck supple.  Cardiovascular: Normal rate.   Pulmonary/Chest: Effort normal. No respiratory distress.  Musculoskeletal: She exhibits no edema.  Neurological: She is alert and oriented to person, place, and time. She exhibits normal muscle tone.  Skin: Skin is warm and dry.  See history of present illness. The fourth  most posterior staple had been twisted into a roughly angulated bot circular structure in which it could not be removed with staple removers.  Psychiatric: She has a normal mood and affect.  Nursing note and vitals reviewed.   ED Course  .Suture Removal Date/Time: 10/29/2015 4:44 PM Performed by: Marcha Dutton, Oneta Sigman Authorized by: Ihor Gully D Consent: Verbal consent obtained. Risks and benefits: risks, benefits and alternatives were discussed Consent given by: patient Patient understanding: patient states understanding of the procedure being performed Patient identity confirmed: verbally with patient Body  area: head/neck Location details: scalp Wound Appearance: warm and clean Staples Removed: 4 Patient tolerance: Patient tolerated the procedure well with no immediate complications Comments: The most posterior stable could not be removed by traditional methods. The area closed with the staple was anesthetized with 2% lidocaine 2 cc. The staple had been loosened from one side of the wound prior to arrival to the urgent care. There was pre-existing wound dehiscence. Attempts to cut the staple was unsuccessful. After further cleansing of the wound portion of scalp  tissue that contained the staple had to be excised away. This created a larger gap and additional venous bleeding. Was then closed with 6-0 chromic gut times 2 interrupted sutures. There was sufficient  separation of the wound anterior to the newly sutured area to allow drainage. The wound was not closed tightly. The bleeding was well controlled. No bleeding on discharge.   (including critical care time)  Labs Review Labs Reviewed - No data to display  Imaging Review No results found.   Visual Acuity Review  Right Eye Distance:   Left Eye Distance:   Bilateral Distance:    Right Eye Near:   Left Eye Near:    Bilateral Near:         MDM   1. Encounter for staple removal   2. Dehiscence of wound of skin, initial encounter    GET HELP IF:  You have a lot of bleeding from your surgical cut.  Your wound does not seem to be healing right.  You have a fever. GET HELP RIGHT AWAY IF:  You have more puffiness (swelling) or redness around the wound.  You have more pain in the wound.  You have yellowish white fluid (pus) coming from the wound.  More of the wound breaks open. MAKE SURE YOU:  Understand these instructions.  Will watch your condition.  Will get help right away if you are not doing well or get worse.       Janne Napoleon, NP 10/29/15 1648  Janne Napoleon, NP 10/29/15 [phone]

## 2015-11-01 ENCOUNTER — Ambulatory Visit (INDEPENDENT_AMBULATORY_CARE_PROVIDER_SITE_OTHER)
Admission: RE | Admit: 2015-11-01 | Discharge: 2015-11-01 | Disposition: A | Discharge status: Home / Self Care | Payer: Medicare Other | Source: Ambulatory Visit | Attending: Internal Medicine | Admitting: Internal Medicine

## 2015-11-01 DIAGNOSIS — M545 Low back pain: Secondary | ICD-10-CM | POA: Diagnosis not present

## 2015-11-02 ENCOUNTER — Other Ambulatory Visit: Payer: Self-pay | Admitting: Internal Medicine

## 2015-11-16 ENCOUNTER — Encounter: Payer: Self-pay | Admitting: Nurse Practitioner

## 2015-11-16 ENCOUNTER — Ambulatory Visit (INDEPENDENT_AMBULATORY_CARE_PROVIDER_SITE_OTHER): Payer: Medicare Other | Admitting: Nurse Practitioner

## 2015-11-16 ENCOUNTER — Telehealth: Payer: Self-pay | Admitting: Internal Medicine

## 2015-11-16 VITALS — BP 110/70 | HR 64 | Temp 97.8°F

## 2015-11-16 DIAGNOSIS — R51 Headache: Secondary | ICD-10-CM

## 2015-11-16 DIAGNOSIS — R519 Headache, unspecified: Secondary | ICD-10-CM

## 2015-11-16 MED ORDER — DOXYCYCLINE HYCLATE 100 MG PO TABS: 100.0000 mg | ORAL_TABLET | Freq: Two times a day (BID) | ORAL | Status: DC

## 2015-11-16 NOTE — Patient Instructions (Signed)
Wash your hair after 24 hours   Laceration Care, Adult A laceration is a cut that goes through all of the layers of the skin and into the tissue that is right under the skin. Some lacerations heal on their own. Others need to be closed with stitches (sutures), staples, skin adhesive strips, or skin glue. Proper laceration care minimizes the risk of infection and helps the laceration to heal better. HOW TO CARE FOR YOUR LACERATION If sutures or staples were used:  Keep the wound clean and dry.  If you were given a bandage (dressing), you should change it at least one time per day or as told by your health care provider. You should also change it if it becomes wet or dirty.  Keep the wound completely dry for the first 24 hours or as told by your health care provider. After that time, you may shower or bathe. However, make sure that the wound is not soaked in water until after the sutures or staples have been removed.  Clean the wound one time each day or as told by your health care provider:  Wash the wound with soap and water.  Rinse the wound with water to remove all soap.  Pat the wound dry with a clean towel. Do not rub the wound.  After cleaning the wound, apply a thin layer of antibiotic ointmentas told by your health care provider. This will help to prevent infection and keep the dressing from sticking to the wound.  Have the sutures or staples removed as told by your health care provider. If skin adhesive strips were used:  Keep the wound clean and dry.  If you were given a bandage (dressing), you should change it at least one time per day or as told by your health care provider. You should also change it if it becomes dirty or wet.  Do not get the skin adhesive strips wet. You may shower or bathe, but be careful to keep the wound dry.  If the wound gets wet, pat it dry with a clean towel. Do not rub the wound.  Skin adhesive strips fall off on their own. You may trim the  strips as the wound heals. Do not remove skin adhesive strips that are still stuck to the wound. They will fall off in time. If skin glue was used:  Try to keep the wound dry, but you may briefly wet it in the shower or bath. Do not soak the wound in water, such as by swimming.  After you have showered or bathed, gently pat the wound dry with a clean towel. Do not rub the wound.  Do not do any activities that will make you sweat heavily until the skin glue has fallen off on its own.  Do not apply liquid, cream, or ointment medicine to the wound while the skin glue is in place. Using those may loosen the film before the wound has healed.  If you were given a bandage (dressing), you should change it at least one time per day or as told by your health care provider. You should also change it if it becomes dirty or wet.  If a dressing is placed over the wound, be careful not to apply tape directly over the skin glue. Doing that may cause the glue to be pulled off before the wound has healed.  Do not pick at the glue. The skin glue usually remains in place for 5-10 days, then it falls off of the  skin. General Instructions  Take over-the-counter and prescription medicines only as told by your health care provider.  If you were prescribed an antibiotic medicine or ointment, take or apply it as told by your doctor. Do not stop using it even if your condition improves.  To help prevent scarring, make sure to cover your wound with sunscreen whenever you are outside after stitches are removed, after adhesive strips are removed, or when glue remains in place and the wound is healed. Make sure to wear a sunscreen of at least 30 SPF.  Do not scratch or pick at the wound.  Keep all follow-up visits as told by your health care provider. This is important.  Check your wound every day for signs of infection. Watch for:  Redness, swelling, or pain.  Fluid, blood, or pus.  Raise (elevate) the injured  area above the level of your heart while you are sitting or lying down, if possible. SEEK MEDICAL CARE IF:  You received a tetanus shot and you have swelling, severe pain, redness, or bleeding at the injection site.  You have a fever.  A wound that was closed breaks open.  You notice a bad smell coming from your wound or your dressing.  You notice something coming out of the wound, such as wood or glass.  Your pain is not controlled with medicine.  You have increased redness, swelling, or pain at the site of your wound.  You have fluid, blood, or pus coming from your wound.  You notice a change in the color of your skin near your wound.  You need to change the dressing frequently due to fluid, blood, or pus draining from the wound.  You develop a new rash.  You develop numbness around the wound. SEEK IMMEDIATE MEDICAL CARE IF:  You develop severe swelling around the wound.  Your pain suddenly increases and is severe.  You develop painful lumps near the wound or on skin that is anywhere on your body.  You have a red streak going away from your wound.  The wound is on your hand or foot and you cannot properly move a finger or toe.  The wound is on your hand or foot and you notice that your fingers or toes look pale or bluish.   This information is not intended to replace advice given to you by your health care provider. Make sure you discuss any questions you have with your health care provider.   Document Released: 07/28/2005 Document Revised: 12/12/2014 Document Reviewed: 07/24/2014 Elsevier Interactive Patient Education [employer].

## 2015-11-16 NOTE — Telephone Encounter (Signed)
Patient Name: [name] DOB: 09-16-1935 Initial Comment Caller states, wife , 4 weeks ago fell and hit her head, was seen in the ER, then asked to come see the Dr as a follow up. She still has some bleeding some from 4 weeks ago, scalp wound. Nurse Assessment Nurse: Ronnald Ramp, RN, Miranda Date/Time (Eastern Time): 11/16/2015 1:23:49 PM Confirm and document reason for call. If symptomatic, describe symptoms. You must click the next button to save text entered. ---Caller states his wife fell 4 weeks ago and hit the back of her head. She had a cut with arterial bleeding at the time and had staples. She had the staples removed a few weeks ago and she continues to have some mild bleeding. Caller states all of the symptoms associated with the head injury have resolved. Has the patient traveled out of the country within the last 30 days? ---Not Applicable Does the patient have any new or worsening symptoms? ---Yes Will a triage be completed? ---Yes Related visit to physician within the last 2 weeks? ---No Does the PT have any chronic conditions? (i.e. diabetes, asthma, etc.) ---Yes List chronic conditions. ---HTN, GERD, Not on any blood thinners Is this a behavioral health or substance abuse call? ---No Guidelines Guideline Title Affirmed Question Affirmed Notes Cuts and Lacerations [1] After 14 days AND [2] wound isn't healed Final Disposition User See PCP When Office is Open (within 3 days) Ronnald Ramp, RN, Miranda Comments No appt available with PCP in recommended time frame. Appt scheduled for today at 4:00pm with Lorane Gell NP Disagree/Comply: Leta Baptist

## 2015-11-16 NOTE — Progress Notes (Signed)
Pre visit review using our clinic review tool, if applicable. No additional management support is needed unless otherwise documented below in the visit note. 

## 2015-11-16 NOTE — Progress Notes (Signed)
Patient ID: [name], female    DOB: 10/26/1935  Age: 80 y.o. MRN: [id number]  CC: Head injury   HPI [name] presents for CC of scalp concerns.   1) Pt was in [place] listening to [name] with her husband when she fell backwards striking the back of her head and causing a scalp laceration. EMS was called and she was taken to the ED for eval on 10/19/15. Denies LOC  CT of head and neck was without significant findings and the wound was repaired.   Followed up with PCP on 10/22/15  Staples were unable to be removed due to matting  No signs of infection and adequate pain control achieved at that visit  UC visit on 10/29/15 removed 4 staples   Dehiscence occurred and it was sutured   Hemostasis was achieved prior to leaving   Today: Pt still experiencing tenderness around injury, husband reports oozing from site and that the pillow had a small spot of blood on it.   History [name] has a past medical history of Colon polyps; Depression; GERD (gastroesophageal reflux disease); Osteoarthritis; UTI (urinary tract infection); Stress incontinence, female; Hypertension; Whooping cough; Macular degeneration; Glaucoma; Complication of anesthesia; Hypercholesteremia; Osteopenia; and Lichen simplex chronicus.   She has past surgical history that includes Bladder repair; Mohs surgery; vertebroplasty secondary to traumatic compression fracture; Cholecystectomy; Abdominal hysterectomy; Tonsillectomy and adenoidectomy; Appendectomy; Urethral sling (2007); Urethral sling (1/12); Lesion removal (Right, 06/28/2013); Eye surgery (11/14, 12/14); and Eye surgery (2016).   Her family history includes Alcoholism in her mother; Breast cancer in her maternal grandmother; Congestive Heart Failure in her mother; Hypertension in her sister; Osteoporosis in her mother; Rheum arthritis in her daughter; Thyroid disease in her mother and sister.She reports that she quit smoking about 50 years ago. She has  never used smokeless tobacco. She reports that she drinks about 0.6 oz of alcohol per week. She reports that she does not use illicit drugs.  Outpatient Prescriptions Prior to Visit  Medication Sig Dispense Refill  . Acetaminophen (TYLENOL PO) Take by mouth as needed.    Marland Kitchen aspirin 81 MG tablet Take 81 mg by mouth daily.     . COMBIGAN 0.2-0.5 % ophthalmic solution   0  . ezetimibe-simvastatin (VYTORIN) 10-20 MG per tablet Take 1 tablet by mouth daily. 90 tablet 2  . latanoprost (XALATAN) 0.005 % ophthalmic solution   0  . nadolol (CORGARD) 40 MG tablet take 1 tablet by mouth once daily at bedtime 30 tablet 1  . traZODone (DESYREL) 100 MG tablet Take 25 mg by mouth at bedtime. Reported on 10/22/2015    . venlafaxine (EFFEXOR-XR) 150 MG 24 hr capsule Take 150 mg by mouth 2 (two) times daily.      Marland Kitchen ALPRAZolam (XANAX) 0.5 MG tablet TAKE 1 TABLET BY MOUTH EVERY 6 HOURS AS NEEDED FOR ANXIETY (Patient not taking: Reported on 11/16/2015) 30 tablet 5  . carbamide peroxide (DEBROX) 6.5 % otic solution Place 5 drops into both ears 2 (two) times daily. (Patient not taking: Reported on 11/16/2015) 15 mL 0  . clobetasol ointment (TEMOVATE) 0.05 % Apply topically 2 (two) times daily. Use for no more than 14 days (Patient not taking: Reported on 11/16/2015) 60 g 2  . HYDROcodone-acetaminophen (NORCO/VICODIN) 5-325 MG tablet Take 0.5-1 tablets by mouth every 6 (six) hours as needed for severe pain. 10 tablet 0  . Menthol, Topical Analgesic, (BIOFREEZE ROLL-ON) 4 % GEL Apply to area twice a day as needed (  Patient not taking: Reported on 11/16/2015) 89 mL 2   No facility-administered medications prior to visit.    ROS Review of Systems  Constitutional: Negative for fever, chills, diaphoresis and fatigue.  Respiratory: Negative for chest tightness, shortness of breath and wheezing.   Cardiovascular: Negative for chest pain, palpitations and leg swelling.  Gastrointestinal: Negative for nausea, vomiting and diarrhea.    Skin: Positive for wound. Negative for rash.  Neurological: Negative for dizziness, weakness, numbness and headaches.  Psychiatric/Behavioral: Negative for confusion. The patient is not nervous/anxious.     Objective:  BP 110/70 mmHg  Pulse 64  Temp(Src) 97.8 F (36.6 C) (Oral)  SpO2 95%  LMP 08/11/1974  Physical Exam  Constitutional: She is oriented to person, place, and time. She appears well-developed and well-nourished. No distress.  HENT:  Head: Normocephalic and atraumatic.    Right Ear: External ear normal.  Left Ear: External ear normal.  Large 2 cm area approx that appears to be eschar. The site was palpated and a good amount of blood had pooled and was released through a small opening at the 2 o'clock area that was already open. The blood was slightly purulent in nature. No further debriedment of the area was performed. Mild tenderness around scalp  Cardiovascular: Normal rate, regular rhythm and normal heart sounds.   Pulmonary/Chest: Effort normal and breath sounds normal. No respiratory distress. She has no wheezes. She has no rales. She exhibits no tenderness.  Neurological: She is alert and oriented to person, place, and time. Coordination normal.  Skin: Skin is warm and dry. No rash noted. She is not diaphoretic.  Psychiatric: She has a normal mood and affect. Her behavior is normal. Judgment and thought content normal.   Assessment & Plan:   [name] was seen today for head injury.  Diagnoses and all orders for this visit:  Scalp pain  Other orders -     doxycycline (VIBRA-TABS) 100 MG tablet; Take 1 tablet (100 mg total) by mouth 2 (two) times daily.  I have discontinued [name]'s Menthol (Topical Analgesic), ALPRAZolam, clobetasol ointment, carbamide peroxide, and HYDROcodone-acetaminophen. I am also having her start on doxycycline. Additionally, I am having her maintain her aspirin, venlafaxine XR, Acetaminophen (TYLENOL PO), COMBIGAN, latanoprost,  ezetimibe-simvastatin, traZODone, and nadolol.  Meds ordered this encounter  Medications  . doxycycline (VIBRA-TABS) 100 MG tablet    Sig: Take 1 tablet (100 mg total) by mouth 2 (two) times daily.    Dispense:  14 tablet    Refill:  0    Order Specific Question:  Supervising Provider    Answer:  Crecencio Mc [[id number]]     Follow-up: Return if symptoms worsen or fail to improve.

## 2015-11-19 NOTE — Assessment & Plan Note (Signed)
Hard to observe due to matted nature in hair. Site was cleaned with an alcohol swab and after expressing blood- was not as serosanguinous as hoped. Slightly opaque- concern for scalp infection due to pain and drainage. Doxycyline given, after all blood under the eschar was expressed there was hemostasis and triple abx ointment was placed over site. Discussed 24 hours of being careful with the site before washing hair. Doxy sent to pharmacy and we discussed how to take and possible side effects. FU w/ PCP or UC if worsening or change in symptoms.

## 2015-12-06 ENCOUNTER — Other Ambulatory Visit: Payer: Self-pay | Admitting: Neurosurgery

## 2015-12-06 DIAGNOSIS — M5136 Other intervertebral disc degeneration, lumbar region: Secondary | ICD-10-CM

## 2015-12-13 LAB — HM MAMMOGRAPHY

## 2015-12-17 ENCOUNTER — Other Ambulatory Visit: Payer: Medicare Other

## 2015-12-20 ENCOUNTER — Encounter: Payer: Self-pay | Admitting: Geriatric Medicine

## 2015-12-21 ENCOUNTER — Ambulatory Visit
Admission: RE | Admit: 2015-12-21 | Discharge: 2015-12-21 | Disposition: A | Discharge status: Home / Self Care | Payer: Medicare Other | Source: Ambulatory Visit | Attending: Neurosurgery | Admitting: Neurosurgery

## 2015-12-21 DIAGNOSIS — M5136 Other intervertebral disc degeneration, lumbar region: Secondary | ICD-10-CM

## 2015-12-28 ENCOUNTER — Other Ambulatory Visit: Payer: Self-pay | Admitting: *Deleted

## 2015-12-28 ENCOUNTER — Other Ambulatory Visit: Payer: Self-pay

## 2015-12-28 NOTE — Telephone Encounter (Signed)
Faxed refill request received from [facility] for CLOBETASOL CREAM Last filled by MD on 12/11/14 Last AEX - 12/11/14 Next AEX - 02/22/16 Last MMG - 12/13/15, Bi-Rads 2: Benign  Please advise refills.

## 2015-12-29 MED ORDER — CLOBETASOL PROPIONATE 0.05 % EX OINT: 1.0000 "application " | TOPICAL_OINTMENT | Freq: Two times a day (BID) | CUTANEOUS | Status: DC

## 2015-12-30 ENCOUNTER — Other Ambulatory Visit: Payer: Self-pay | Admitting: Internal Medicine

## 2016-02-22 ENCOUNTER — Ambulatory Visit (INDEPENDENT_AMBULATORY_CARE_PROVIDER_SITE_OTHER): Payer: Medicare Other | Admitting: Obstetrics & Gynecology

## 2016-02-22 ENCOUNTER — Encounter: Payer: Self-pay | Admitting: Obstetrics & Gynecology

## 2016-02-22 VITALS — BP 138/72 | HR 68 | Resp 14 | Ht 61.0 in | Wt 165.6 lb

## 2016-02-22 DIAGNOSIS — R42 Dizziness and giddiness: Secondary | ICD-10-CM | POA: Diagnosis not present

## 2016-02-22 DIAGNOSIS — Z01419 Encounter for gynecological examination (general) (routine) without abnormal findings: Secondary | ICD-10-CM

## 2016-02-22 DIAGNOSIS — S0990XS Unspecified injury of head, sequela: Secondary | ICD-10-CM

## 2016-02-22 DIAGNOSIS — G44309 Post-traumatic headache, unspecified, not intractable: Secondary | ICD-10-CM

## 2016-02-22 NOTE — Progress Notes (Signed)
80 y.o. G4P2 Married Caucasian F here for annual exam.  She reports she fell in March and had scalp laceration.  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  She wants to be clear "there was no alcohol" and she tripped over a little mat coming of the door.   Staple placed and removed in late march.  Pt can still feel where the laceration was in her scalp.  Reports she is having dizziness and trouble with being more unsteady.  Having headaches since she had the fall.  Has not seen a neurologist.  Reports chronic dizziness that has worsened.   Denies vaginal bleeding.    Continues to have issues with incontinence.  H/o cystocele.    PCP:  Dr. Sharlet Salina  Patient's last menstrual period was 08/11/1974.          Sexually active: No.  The current method of family planning is none.    Exercising: No.  The patient does not participate in regular exercise at present. Smoker:  Former smoker  Health Maintenance: Pap:  2005 normal  History of abnormal Pap:  no MMG:  12/11/14 normal  Colonoscopy:  2014 repeat in 5 years  BMD:   10/14/2013 normal  TDaP:  04/03/2015  Pneumonia vaccine(s):  10/08/2015  Zostavax:   08/11/2009 Hep C testing: not indicated Screening Labs: drawn today, Hb today: drawn today, Urine today: unable to void at this time   reports that she quit smoking about 50 years ago. She has never used smokeless tobacco. She reports that she drinks about 0.6 oz of alcohol per week. She reports that she does not use illicit drugs.  Past Medical History  Diagnosis Date  . Colon polyps   . Depression   . GERD (gastroesophageal reflux disease)   . Osteoarthritis   . Hx: UTI (urinary tract infection)   . Stress incontinence, female   . Hypertension   . Whooping cough   . Macular degeneration   . Glaucoma   . Complication of anesthesia     takes longer to wake up- admitted overnight after colonoscopy  . Hypercholesteremia   . Osteopenia     of the hip  .  Lichen simplex chronicus     Past Surgical History  Procedure Laterality Date  . Bladder repair      x two  . Mohs surgery      procedure to remove basal cell  . Vertebroplasty secondary to traumatic compression fracture    . Cholecystectomy    . Abdominal hysterectomy      ovaries remain  . Tonsillectomy and adenoidectomy    . Appendectomy    . Urethral sling  2007  . Urethral sling  1/12    midurethral   . Lesion removal Right 06/28/2013    Procedure: EXCISION 3 cm right labial sebaceous cyst;  Surgeon: Lyman Speller, MD;  Location: [facility];  Service: Gynecology;  Laterality: Right;  . Eye surgery  11/14, 12/14    Macular Dengeneration, Glaucoma  . Eye surgery  2016    left eye    Current Outpatient Prescriptions  Medication Sig Dispense Refill  . Acetaminophen (TYLENOL PO) Take by mouth as needed.    Marland Kitchen aspirin 81 MG tablet Take 81 mg by mouth daily.     . clobetasol ointment (TEMOVATE) [id number] % Apply 1 application topically 2 (two) times daily. USE NO MORE THAN 14 DAYS 30 g 0  . COMBIGAN 0.2-0.5 %  ophthalmic solution   0  . doxycycline (VIBRA-TABS) 100 MG tablet Take 1 tablet (100 mg total) by mouth 2 (two) times daily. 14 tablet 0  . ezetimibe-simvastatin (VYTORIN) 10-20 MG per tablet Take 1 tablet by mouth daily. 90 tablet 2  . latanoprost (XALATAN) 0.005 % ophthalmic solution   0  . nadolol (CORGARD) 40 MG tablet take 1 tablet by mouth once daily at bedtime 30 tablet 1  . traZODone (DESYREL) 100 MG tablet Take 25 mg by mouth at bedtime. Reported on 10/22/2015    . venlafaxine (EFFEXOR-XR) 150 MG 24 hr capsule Take 150 mg by mouth 2 (two) times daily.       No current facility-administered medications for this visit.    Family History  Problem Relation Age of Onset  . Breast cancer Maternal Grandmother   . Hypertension Sister   . Congestive Heart Failure Mother   . Thyroid disease Mother   . Thyroid disease Sister   . Osteoporosis Mother   . Rheum arthritis  Daughter   . Alcoholism Mother     ROS:  Pertinent items are noted in HPI.  Otherwise, a comprehensive ROS was negative.  Exam:   . Filed Vitals:   02/22/16 1311  BP: 138/72  Pulse: 68  Resp: 14  Height: 5\' 1"  (1.549 m)  Weight: 165 lb 9.6 oz (75.116 kg)    General appearance: alert, cooperative and appears stated age Head: Normocephalic, without obvious abnormality, atraumatic Neck: no adenopathy, supple, symmetrical, trachea midline and thyroid normal to inspection and palpation Lungs: clear to auscultation bilaterally Breasts: normal appearance, no masses or tenderness Heart: regular rate and rhythm Abdomen: soft, non-tender; bowel sounds normal; no masses,  no organomegaly Extremities: extremities normal, atraumatic, no cyanosis or edema Skin: Skin color, texture, turgor normal. No rashes or lesions Lymph nodes: Cervical, supraclavicular, and axillary nodes normal. No abnormal inguinal nodes palpated Neurologic: Grossly normal   Pelvic: External genitalia:  no lesions              Urethra:  normal appearing urethra with no masses, tenderness or lesions              Bartholins and Skenes: normal                 Vagina: normal appearing vagina with normal color and discharge, no lesions, 3rd degree cystocele              Cervix: absent              Pap taken: No. Bimanual Exam:  Uterus:  uterus absent              Adnexa: no mass, fullness, tenderness               Rectovaginal: Confirms               Anus:  normal sphincter tone, no lesions  Chaperone was present for exam.  A:  Well Woman with normal exam PMP, no HRT H/O recurrent UTIs but none in about two years Significant urinary incontinence--wears pads and depends--and has chronic skin irritation from chronic moisture Hypertension Fall with head trauma 3/17 and scalp laceration.  Now with increased dizziness and headaches.  Feeling more unsteady now as well.  P: Mammogram yearly. pap smear not  indicated Clobetasol 0.05% ointment RF not needed.  She uses this sparingly.  Will call when needs RF. Referral to neurology return annually or prn

## 2016-03-07 ENCOUNTER — Other Ambulatory Visit: Payer: Self-pay | Admitting: Internal Medicine

## 2016-03-11 ENCOUNTER — Other Ambulatory Visit: Payer: Self-pay | Admitting: Neurosurgery

## 2016-03-17 ENCOUNTER — Encounter (HOSPITAL_COMMUNITY): Payer: Self-pay

## 2016-03-17 NOTE — Pre-Procedure Instructions (Signed)
[name]  03/17/2016      [facility] [address] [place], [place] [address] Phone: [phone] Fax: [phone]  [id number] [place], [place] [id number] [place] [address] Phone: [phone] Fax: [place], [place] [address] Phone: [phone] Fax: [phone]    Your procedure is scheduled on Thursday, August 10th, 2017.  Report to [facility] Admitting at 8:45 A.M.   Call this number if you have problems the morning of surgery:  [phone]   Remember:  Do not eat food or drink liquids after midnight.   Take these medicines the morning of surgery with A SIP OF WATER: Acetaminophen (Tylenol) if needed, Venlafaxine (Effexor-XR), eye drops.  Stop taking: Aspirin, NSAIDS, Aleve, Naproxen, Ibuprofen, Advil, Motrin, BC's, Goody's, Fish oil, all herbal medications, and all vitamins.    Do not wear jewelry, make-up or nail polish.  Do not wear lotions, powders, or perfumes.  You may NOT wear deoderant.  Do not shave 48 hours prior to surgery.   Do not bring valuables to the hospital.  [facility] is not responsible for any belongings or valuables.  Contacts, dentures or bridgework may not be worn into surgery.  Leave your suitcase in the car.  After surgery it may be brought to your room.  For patients admitted to the hospital, discharge time will be determined by your treatment team.  Patients discharged the day of surgery will not be allowed to drive home.   Special instructions:  Preparing for Surgery.   Please read over the following fact sheets that you were given. MRSA Information    [facility]- Preparing For Surgery  Before surgery, you can play an important role. Because skin is not sterile, your  skin needs to be as free of germs as possible. You can reduce the number of germs on your skin by washing with CHG (chlorahexidine gluconate) Soap before surgery.  CHG is an antiseptic cleaner which kills germs and bonds with the skin to continue killing germs even after washing.  Please do not use if you have an allergy to CHG or antibacterial soaps. If your skin becomes reddened/irritated stop using the CHG.  Do not shave (including legs and underarms) for at least 48 hours prior to first CHG shower. It is OK to shave your face.  Please follow these instructions carefully.   1. Shower the NIGHT BEFORE SURGERY and the MORNING OF SURGERY with CHG.   2. If you chose to wash your hair, wash your hair first as usual with your normal shampoo.  3. After you shampoo, rinse your hair and body thoroughly to remove the shampoo.  4. Use CHG as you would any other liquid soap. You can apply CHG directly to the skin and wash gently with a scrungie or a clean washcloth.   5. Apply the CHG Soap to your body ONLY FROM THE NECK DOWN.  Do not use on open wounds or open sores. Avoid contact with your eyes, ears, mouth and genitals (private parts). Wash genitals (private parts) with your normal soap.  6. Wash thoroughly, paying special attention to the area where your surgery will be performed.  7. Thoroughly rinse your body with warm water from the neck down.  8. DO NOT shower/wash with your  normal soap after using and rinsing off the CHG Soap.  9. Pat yourself dry with a CLEAN TOWEL.   10. Wear CLEAN PAJAMAS   11. Place CLEAN SHEETS on your bed the night of your first shower and DO NOT SLEEP WITH PETS.  Day of Surgery: Do not apply any deodorants/lotions. Please wear clean clothes to the hospital/surgery center.

## 2016-03-18 ENCOUNTER — Encounter (HOSPITAL_COMMUNITY)
Admission: RE | Admit: 2016-03-18 | Discharge: 2016-03-18 | Disposition: A | Discharge status: Home / Self Care | Payer: Medicare Other | Source: Ambulatory Visit | Attending: Neurosurgery | Admitting: Neurosurgery

## 2016-03-18 ENCOUNTER — Encounter (HOSPITAL_COMMUNITY): Payer: Self-pay

## 2016-03-18 DIAGNOSIS — E785 Hyperlipidemia, unspecified: Secondary | ICD-10-CM | POA: Diagnosis not present

## 2016-03-18 DIAGNOSIS — H409 Unspecified glaucoma: Secondary | ICD-10-CM | POA: Diagnosis not present

## 2016-03-18 DIAGNOSIS — Z87891 Personal history of nicotine dependence: Secondary | ICD-10-CM | POA: Diagnosis not present

## 2016-03-18 DIAGNOSIS — Z7982 Long term (current) use of aspirin: Secondary | ICD-10-CM | POA: Diagnosis not present

## 2016-03-18 DIAGNOSIS — I10 Essential (primary) hypertension: Secondary | ICD-10-CM | POA: Diagnosis not present

## 2016-03-18 DIAGNOSIS — Z79899 Other long term (current) drug therapy: Secondary | ICD-10-CM | POA: Diagnosis not present

## 2016-03-18 DIAGNOSIS — M4726 Other spondylosis with radiculopathy, lumbar region: Secondary | ICD-10-CM | POA: Diagnosis not present

## 2016-03-18 DIAGNOSIS — M5116 Intervertebral disc disorders with radiculopathy, lumbar region: Secondary | ICD-10-CM | POA: Diagnosis not present

## 2016-03-18 DIAGNOSIS — M4806 Spinal stenosis, lumbar region: Secondary | ICD-10-CM | POA: Diagnosis present

## 2016-03-18 DIAGNOSIS — K219 Gastro-esophageal reflux disease without esophagitis: Secondary | ICD-10-CM | POA: Diagnosis not present

## 2016-03-18 DIAGNOSIS — E7800 Pure hypercholesterolemia, unspecified: Secondary | ICD-10-CM | POA: Diagnosis not present

## 2016-03-18 DIAGNOSIS — F329 Major depressive disorder, single episode, unspecified: Secondary | ICD-10-CM | POA: Diagnosis not present

## 2016-03-18 HISTORY — DX: Headache, unspecified: R51.9

## 2016-03-18 HISTORY — DX: Headache: R51

## 2016-03-18 HISTORY — DX: Presence of spectacles and contact lenses: Z97.3

## 2016-03-18 HISTORY — DX: Unsteadiness on feet: R26.81

## 2016-03-18 LAB — BASIC METABOLIC PANEL
Anion gap: 6 (ref 5–15)
BUN: 11 mg/dL (ref 6–20)
CALCIUM: 9.6 mg/dL (ref 8.9–10.3)
CO2: 30 mmol/L (ref 22–32)
CREATININE: 0.76 mg/dL (ref 0.44–1.00)
Chloride: 104 mmol/L (ref 101–111)
GFR calc Af Amer: >60 mL/min (ref >60)
GFR calc non Af Amer: >60 mL/min (ref >60)
GLUCOSE: 102 mg/dL — AB (ref 65–99)
Potassium: 4 mmol/L (ref 3.5–5.1)
Sodium: 140 mmol/L (ref 135–145)

## 2016-03-18 LAB — CBC
HEMATOCRIT: 45.1 % (ref 36.0–46.0)
Hemoglobin: 14 g/dL (ref 12.0–15.0)
MCH: 31 pg (ref 26.0–34.0)
MCHC: 31 g/dL (ref 30.0–36.0)
MCV: 100 fL (ref 78.0–100.0)
PLATELETS: 221 10*3/uL (ref 150–400)
RBC: 4.51 MIL/uL (ref 3.87–5.11)
RDW: 14.5 % (ref 11.5–15.5)
WBC: 7.7 10*3/uL (ref 4.0–10.5)

## 2016-03-18 LAB — SURGICAL PCR SCREEN
MRSA, PCR: NEGATIVE
Staphylococcus aureus: NEGATIVE

## 2016-03-18 NOTE — Progress Notes (Signed)
PCP - Dr. Pricilla Holm Cardiologist - denies  EKG - 10/19/15 CXR - denies  Echo/stress test/cardiac cath - denies  Patient denies chest pain and shortness of breath at PAT appointment.

## 2016-03-19 ENCOUNTER — Ambulatory Visit: Payer: Medicare Other | Admitting: Neurology

## 2016-03-19 MED ORDER — CEFAZOLIN SODIUM-DEXTROSE 2-4 GM/100ML-% IV SOLN
2.0000 g | INTRAVENOUS | Status: AC
  Administered 2016-03-20: 2 g via INTRAVENOUS
  Filled 2016-03-19: qty 100

## 2016-03-20 ENCOUNTER — Ambulatory Visit (HOSPITAL_COMMUNITY): Payer: Medicare Other | Admitting: Anesthesiology

## 2016-03-20 ENCOUNTER — Encounter (HOSPITAL_COMMUNITY)
Admission: RE | Disposition: A | Discharge status: Home / Self Care | Payer: Self-pay | Source: Ambulatory Visit | Attending: Neurosurgery

## 2016-03-20 ENCOUNTER — Ambulatory Visit (HOSPITAL_COMMUNITY): Payer: Medicare Other

## 2016-03-20 ENCOUNTER — Encounter (HOSPITAL_COMMUNITY): Payer: Self-pay | Admitting: Urology

## 2016-03-20 ENCOUNTER — Observation Stay (HOSPITAL_COMMUNITY)
Admission: RE | Admit: 2016-03-20 | Discharge: 2016-03-21 | Disposition: A | Discharge status: Home / Self Care | Payer: Medicare Other | Source: Ambulatory Visit | Attending: Neurosurgery | Admitting: Neurosurgery

## 2016-03-20 DIAGNOSIS — E7800 Pure hypercholesterolemia, unspecified: Secondary | ICD-10-CM | POA: Insufficient documentation

## 2016-03-20 DIAGNOSIS — Z7982 Long term (current) use of aspirin: Secondary | ICD-10-CM | POA: Insufficient documentation

## 2016-03-20 DIAGNOSIS — M4806 Spinal stenosis, lumbar region: Secondary | ICD-10-CM | POA: Diagnosis not present

## 2016-03-20 DIAGNOSIS — F329 Major depressive disorder, single episode, unspecified: Secondary | ICD-10-CM | POA: Insufficient documentation

## 2016-03-20 DIAGNOSIS — M4726 Other spondylosis with radiculopathy, lumbar region: Secondary | ICD-10-CM | POA: Insufficient documentation

## 2016-03-20 DIAGNOSIS — H409 Unspecified glaucoma: Secondary | ICD-10-CM | POA: Diagnosis not present

## 2016-03-20 DIAGNOSIS — M5116 Intervertebral disc disorders with radiculopathy, lumbar region: Secondary | ICD-10-CM | POA: Diagnosis not present

## 2016-03-20 DIAGNOSIS — K219 Gastro-esophageal reflux disease without esophagitis: Secondary | ICD-10-CM | POA: Insufficient documentation

## 2016-03-20 DIAGNOSIS — Z87891 Personal history of nicotine dependence: Secondary | ICD-10-CM | POA: Insufficient documentation

## 2016-03-20 DIAGNOSIS — Z419 Encounter for procedure for purposes other than remedying health state, unspecified: Secondary | ICD-10-CM

## 2016-03-20 DIAGNOSIS — E785 Hyperlipidemia, unspecified: Secondary | ICD-10-CM | POA: Insufficient documentation

## 2016-03-20 DIAGNOSIS — Z79899 Other long term (current) drug therapy: Secondary | ICD-10-CM | POA: Insufficient documentation

## 2016-03-20 DIAGNOSIS — M48062 Spinal stenosis, lumbar region with neurogenic claudication: Secondary | ICD-10-CM | POA: Diagnosis present

## 2016-03-20 DIAGNOSIS — I10 Essential (primary) hypertension: Secondary | ICD-10-CM | POA: Insufficient documentation

## 2016-03-20 HISTORY — PX: LUMBAR LAMINECTOMY/DECOMPRESSION MICRODISCECTOMY: SHX5026

## 2016-03-20 SURGERY — LUMBAR LAMINECTOMY/DECOMPRESSION MICRODISCECTOMY 1 LEVEL
Anesthesia: General | Site: Spine Lumbar | Laterality: Right

## 2016-03-20 MED ORDER — KETOROLAC TROMETHAMINE 30 MG/ML IJ SOLN: 15.0000 mg | Freq: Once | INTRAMUSCULAR | Status: DC

## 2016-03-20 MED ORDER — TIMOLOL MALEATE 0.5 % OP SOLN
1.0000 [drp] | Freq: Every day | OPHTHALMIC | Status: DC
  Administered 2016-03-20: 1 [drp] via OPHTHALMIC
  Filled 2016-03-20: qty 5

## 2016-03-20 MED ORDER — ZOLPIDEM TARTRATE 5 MG PO TABS: 5.0000 mg | ORAL_TABLET | Freq: Every evening | ORAL | Status: DC | PRN

## 2016-03-20 MED ORDER — CHLORHEXIDINE GLUCONATE CLOTH 2 % EX PADS: 6.0000 | MEDICATED_PAD | Freq: Once | CUTANEOUS | Status: DC

## 2016-03-20 MED ORDER — EZETIMIBE 10 MG PO TABS
10.0000 mg | ORAL_TABLET | Freq: Every day | ORAL | Status: DC
  Administered 2016-03-20: 10 mg via ORAL
  Filled 2016-03-20: qty 1

## 2016-03-20 MED ORDER — THROMBIN 5000 UNITS EX SOLR
CUTANEOUS | Status: DC | PRN
  Administered 2016-03-20 (×2): 5000 [IU] via TOPICAL

## 2016-03-20 MED ORDER — EZETIMIBE-SIMVASTATIN 10-20 MG PO TABS
1.0000 | ORAL_TABLET | Freq: Every day | ORAL | Status: DC
  Filled 2016-03-20: qty 1

## 2016-03-20 MED ORDER — 0.9 % SODIUM CHLORIDE (POUR BTL) OPTIME
TOPICAL | Status: DC | PRN
  Administered 2016-03-20: 1000 mL

## 2016-03-20 MED ORDER — OXYCODONE-ACETAMINOPHEN 5-325 MG PO TABS: 1.0000 | ORAL_TABLET | ORAL | Status: DC | PRN

## 2016-03-20 MED ORDER — DEXTROSE 5 % IV SOLN
INTRAVENOUS | Status: DC | PRN
  Administered 2016-03-20: 20 ug/min via INTRAVENOUS

## 2016-03-20 MED ORDER — BRIMONIDINE TARTRATE 0.15 % OP SOLN
1.0000 [drp] | Freq: Every day | OPHTHALMIC | Status: DC
  Administered 2016-03-20: 1 [drp] via OPHTHALMIC
  Filled 2016-03-20: qty 5

## 2016-03-20 MED ORDER — SODIUM CHLORIDE 0.9% FLUSH: 3.0000 mL | INTRAVENOUS | Status: DC | PRN

## 2016-03-20 MED ORDER — TRAZODONE 25 MG HALF TABLET
25.0000 mg | ORAL_TABLET | Freq: Every day | ORAL | Status: DC
  Administered 2016-03-20: 25 mg via ORAL
  Filled 2016-03-20: qty 1

## 2016-03-20 MED ORDER — ONDANSETRON HCL 4 MG/2ML IJ SOLN: 4.0000 mg | Freq: Four times a day (QID) | INTRAMUSCULAR | Status: DC | PRN

## 2016-03-20 MED ORDER — PANTOPRAZOLE SODIUM 40 MG PO TBEC: 40.0000 mg | DELAYED_RELEASE_TABLET | Freq: Every day | ORAL | Status: DC

## 2016-03-20 MED ORDER — EPHEDRINE SULFATE 50 MG/ML IJ SOLN
INTRAMUSCULAR | Status: DC | PRN
  Administered 2016-03-20: 5 mg via INTRAVENOUS

## 2016-03-20 MED ORDER — PROPOFOL 10 MG/ML IV BOLUS
INTRAVENOUS | Status: DC | PRN
  Administered 2016-03-20: 140 mg via INTRAVENOUS

## 2016-03-20 MED ORDER — ONDANSETRON HCL 4 MG/2ML IJ SOLN
INTRAMUSCULAR | Status: AC
  Filled 2016-03-20: qty 2

## 2016-03-20 MED ORDER — ARTIFICIAL TEARS OP OINT
TOPICAL_OINTMENT | OPHTHALMIC | Status: DC | PRN
Start: 2016-03-20 — End: 2016-03-20
  Administered 2016-03-20: 1 via OPHTHALMIC

## 2016-03-20 MED ORDER — HYDROXYZINE HCL 25 MG PO TABS: 50.0000 mg | ORAL_TABLET | ORAL | Status: DC | PRN

## 2016-03-20 MED ORDER — SODIUM CHLORIDE 0.9 % IV SOLN: 250.0000 mL | INTRAVENOUS | Status: DC

## 2016-03-20 MED ORDER — VENLAFAXINE HCL ER 150 MG PO CP24
150.0000 mg | ORAL_CAPSULE | Freq: Two times a day (BID) | ORAL | Status: DC
  Administered 2016-03-20: 150 mg via ORAL
  Filled 2016-03-20 (×2): qty 1

## 2016-03-20 MED ORDER — FENTANYL CITRATE (PF) 100 MCG/2ML IJ SOLN
INTRAMUSCULAR | Status: DC | PRN
  Administered 2016-03-20: 100 ug via INTRAVENOUS

## 2016-03-20 MED ORDER — DEXTROSE 5 % IV SOLN
INTRAVENOUS | Status: DC | PRN
  Administered 2016-03-20: 12:00:00 via INTRAVENOUS

## 2016-03-20 MED ORDER — LACTATED RINGERS IV SOLN: INTRAVENOUS | Status: DC

## 2016-03-20 MED ORDER — ACETAMINOPHEN 10 MG/ML IV SOLN
INTRAVENOUS | Status: DC | PRN
  Administered 2016-03-20: 1000 mg via INTRAVENOUS

## 2016-03-20 MED ORDER — ALUM & MAG HYDROXIDE-SIMETH 200-200-20 MG/5ML PO SUSP: 30.0000 mL | Freq: Four times a day (QID) | ORAL | Status: DC | PRN

## 2016-03-20 MED ORDER — BISACODYL 10 MG RE SUPP: 10.0000 mg | Freq: Every day | RECTAL | Status: DC | PRN

## 2016-03-20 MED ORDER — FENTANYL CITRATE (PF) 100 MCG/2ML IJ SOLN
INTRAMUSCULAR | Status: DC | PRN
  Administered 2016-03-20: 50 ug via INTRAVENOUS
  Administered 2016-03-20 (×2): 100 ug via INTRAVENOUS

## 2016-03-20 MED ORDER — ROCURONIUM BROMIDE 100 MG/10ML IV SOLN
INTRAVENOUS | Status: DC | PRN
  Administered 2016-03-20: 50 mg via INTRAVENOUS
  Administered 2016-03-20: 10 mg via INTRAVENOUS

## 2016-03-20 MED ORDER — HYDROXYZINE HCL 50 MG/ML IM SOLN: 50.0000 mg | INTRAMUSCULAR | Status: DC | PRN

## 2016-03-20 MED ORDER — NADOLOL 40 MG PO TABS
40.0000 mg | ORAL_TABLET | Freq: Every day | ORAL | Status: DC
  Administered 2016-03-20: 40 mg via ORAL
  Filled 2016-03-20: qty 1

## 2016-03-20 MED ORDER — ACETAMINOPHEN 325 MG PO TABS: 650.0000 mg | ORAL_TABLET | ORAL | Status: DC | PRN

## 2016-03-20 MED ORDER — SUGAMMADEX SODIUM 200 MG/2ML IV SOLN
INTRAVENOUS | Status: DC | PRN
  Administered 2016-03-20: 200 mg via INTRAVENOUS

## 2016-03-20 MED ORDER — HEMOSTATIC AGENTS (NO CHARGE) OPTIME
TOPICAL | Status: DC | PRN
  Administered 2016-03-20: 1 via TOPICAL

## 2016-03-20 MED ORDER — ONDANSETRON HCL 4 MG PO TABS: 4.0000 mg | ORAL_TABLET | Freq: Four times a day (QID) | ORAL | Status: DC | PRN

## 2016-03-20 MED ORDER — MEPERIDINE HCL 25 MG/ML IJ SOLN: 6.2500 mg | INTRAMUSCULAR | Status: DC | PRN

## 2016-03-20 MED ORDER — DEXAMETHASONE SODIUM PHOSPHATE 10 MG/ML IJ SOLN
INTRAMUSCULAR | Status: DC | PRN
  Administered 2016-03-20: 10 mg via INTRAVENOUS

## 2016-03-20 MED ORDER — PHENOL 1.4 % MT LIQD: 1.0000 | OROMUCOSAL | Status: DC | PRN

## 2016-03-20 MED ORDER — ONDANSETRON HCL 4 MG/2ML IJ SOLN
INTRAMUSCULAR | Status: DC | PRN
  Administered 2016-03-20: 4 mg via INTRAVENOUS

## 2016-03-20 MED ORDER — SUGAMMADEX SODIUM 200 MG/2ML IV SOLN
INTRAVENOUS | Status: AC
  Filled 2016-03-20: qty 2

## 2016-03-20 MED ORDER — FENTANYL CITRATE (PF) 100 MCG/2ML IJ SOLN: 25.0000 ug | INTRAMUSCULAR | Status: DC | PRN

## 2016-03-20 MED ORDER — FENTANYL CITRATE (PF) 250 MCG/5ML IJ SOLN
INTRAMUSCULAR | Status: AC
  Filled 2016-03-20: qty 5

## 2016-03-20 MED ORDER — CYCLOBENZAPRINE HCL 10 MG PO TABS: 10.0000 mg | ORAL_TABLET | Freq: Three times a day (TID) | ORAL | Status: DC | PRN

## 2016-03-20 MED ORDER — KETOROLAC TROMETHAMINE 15 MG/ML IJ SOLN
INTRAMUSCULAR | Status: AC
  Administered 2016-03-20: 15 mg
  Filled 2016-03-20: qty 1

## 2016-03-20 MED ORDER — HYDROCODONE-ACETAMINOPHEN 5-325 MG PO TABS: 1.0000 | ORAL_TABLET | ORAL | Status: DC | PRN

## 2016-03-20 MED ORDER — BRIMONIDINE TARTRATE-TIMOLOL 0.2-0.5 % OP SOLN: 1.0000 [drp] | Freq: Every day | OPHTHALMIC | Status: DC

## 2016-03-20 MED ORDER — MAGNESIUM HYDROXIDE 400 MG/5ML PO SUSP: 30.0000 mL | Freq: Every day | ORAL | Status: DC | PRN

## 2016-03-20 MED ORDER — ACETAMINOPHEN 650 MG RE SUPP: 650.0000 mg | RECTAL | Status: DC | PRN

## 2016-03-20 MED ORDER — KCL IN DEXTROSE-NACL 20-5-0.45 MEQ/L-%-% IV SOLN: INTRAVENOUS | Status: DC

## 2016-03-20 MED ORDER — LIDOCAINE-EPINEPHRINE 1 %-1:100000 IJ SOLN
INTRAMUSCULAR | Status: DC | PRN
  Administered 2016-03-20: 13 mL

## 2016-03-20 MED ORDER — DEXAMETHASONE SODIUM PHOSPHATE 10 MG/ML IJ SOLN
INTRAMUSCULAR | Status: AC
  Filled 2016-03-20: qty 1

## 2016-03-20 MED ORDER — MORPHINE SULFATE (PF) 4 MG/ML IV SOLN: 4.0000 mg | INTRAVENOUS | Status: DC | PRN

## 2016-03-20 MED ORDER — BUPIVACAINE HCL (PF) 0.5 % IJ SOLN
INTRAMUSCULAR | Status: DC | PRN
  Administered 2016-03-20: 13 mL

## 2016-03-20 MED ORDER — FENTANYL CITRATE (PF) 100 MCG/2ML IJ SOLN
INTRAMUSCULAR | Status: AC
  Filled 2016-03-20: qty 2

## 2016-03-20 MED ORDER — METHYLPREDNISOLONE ACETATE 80 MG/ML IJ SUSP
INTRAMUSCULAR | Status: DC | PRN
  Administered 2016-03-20: 80 mg

## 2016-03-20 MED ORDER — LACTATED RINGERS IV SOLN
INTRAVENOUS | Status: DC
  Administered 2016-03-20: 09:00:00 via INTRAVENOUS

## 2016-03-20 MED ORDER — LATANOPROST 0.005 % OP SOLN
1.0000 [drp] | Freq: Every day | OPHTHALMIC | Status: DC
  Filled 2016-03-20: qty 2.5

## 2016-03-20 MED ORDER — SODIUM CHLORIDE 0.9 % IR SOLN
Status: DC | PRN
  Administered 2016-03-20: 500 mL

## 2016-03-20 MED ORDER — KETOROLAC TROMETHAMINE 30 MG/ML IJ SOLN
15.0000 mg | Freq: Four times a day (QID) | INTRAMUSCULAR | Status: DC
  Administered 2016-03-20 – 2016-03-21 (×2): 15 mg via INTRAVENOUS
  Filled 2016-03-20 (×2): qty 1

## 2016-03-20 MED ORDER — PROPOFOL 10 MG/ML IV BOLUS
INTRAVENOUS | Status: AC
  Filled 2016-03-20: qty 40

## 2016-03-20 MED ORDER — SODIUM CHLORIDE 0.9% FLUSH
3.0000 mL | Freq: Two times a day (BID) | INTRAVENOUS | Status: DC
  Administered 2016-03-20: 3 mL via INTRAVENOUS

## 2016-03-20 MED ORDER — MENTHOL 3 MG MT LOZG: 1.0000 | LOZENGE | OROMUCOSAL | Status: DC | PRN

## 2016-03-20 MED ORDER — SIMVASTATIN 20 MG PO TABS
20.0000 mg | ORAL_TABLET | Freq: Every day | ORAL | Status: DC
  Administered 2016-03-20: 20 mg via ORAL
  Filled 2016-03-20: qty 1

## 2016-03-20 MED ORDER — LIDOCAINE HCL (CARDIAC) 20 MG/ML IV SOLN
INTRAVENOUS | Status: DC | PRN
  Administered 2016-03-20: 50 mg via INTRAVENOUS

## 2016-03-20 MED ORDER — LACTATED RINGERS IV SOLN
INTRAVENOUS | Status: DC | PRN
  Administered 2016-03-20 (×2): via INTRAVENOUS

## 2016-03-20 MED ORDER — ACETAMINOPHEN 10 MG/ML IV SOLN
INTRAVENOUS | Status: AC
Start: 2016-03-20 — End: 2016-03-21
  Filled 2016-03-20: qty 100

## 2016-03-20 MED ORDER — PROMETHAZINE HCL 25 MG/ML IJ SOLN: 6.2500 mg | INTRAMUSCULAR | Status: DC | PRN

## 2016-03-20 SURGICAL SUPPLY — 62 items
ADH SKN CLS APL DERMABOND .7 (GAUZE/BANDAGES/DRESSINGS) ×1
APL SKNCLS STERI-STRIP NONHPOA (GAUZE/BANDAGES/DRESSINGS)
BAG DECANTER FOR FLEXI CONT (MISCELLANEOUS) ×2 IMPLANT
BENZOIN TINCTURE PRP APPL 2/3 (GAUZE/BANDAGES/DRESSINGS) IMPLANT
BLADE CLIPPER SURG (BLADE) IMPLANT
BRUSH SCRUB EZ PLAIN DRY (MISCELLANEOUS) ×2 IMPLANT
BUR ACORN 6.0 ACORN (BURR) IMPLANT
BUR ACRON 5.0MM COATED (BURR) ×1 IMPLANT
BUR MATCHSTICK NEURO 3.0 LAGG (BURR) ×2 IMPLANT
CANISTER SUCT 3000ML PPV (MISCELLANEOUS) ×2 IMPLANT
DERMABOND ADVANCED (GAUZE/BANDAGES/DRESSINGS) ×1
DERMABOND ADVANCED .7 DNX12 (GAUZE/BANDAGES/DRESSINGS) IMPLANT
DRAPE LAPAROTOMY 100X72X124 (DRAPES) ×2 IMPLANT
DRAPE MICROSCOPE LEICA (MISCELLANEOUS) ×2 IMPLANT
DRAPE POUCH INSTRU U-SHP 10X18 (DRAPES) ×2 IMPLANT
DRSG EMULSION OIL 3X3 NADH (GAUZE/BANDAGES/DRESSINGS) IMPLANT
ELECT REM PT RETURN 9FT ADLT (ELECTROSURGICAL) ×2
ELECTRODE REM PT RTRN 9FT ADLT (ELECTROSURGICAL) ×1 IMPLANT
GAUZE SPONGE 4X4 12PLY STRL (GAUZE/BANDAGES/DRESSINGS) IMPLANT
GAUZE SPONGE 4X4 16PLY XRAY LF (GAUZE/BANDAGES/DRESSINGS) IMPLANT
GLOVE BIO SURGEON STRL SZ8 (GLOVE) ×1 IMPLANT
GLOVE BIO SURGEON STRL SZ8.5 (GLOVE) ×1 IMPLANT
GLOVE BIOGEL PI IND STRL 7.5 (GLOVE) IMPLANT
GLOVE BIOGEL PI IND STRL 8 (GLOVE) ×1 IMPLANT
GLOVE BIOGEL PI INDICATOR 7.5 (GLOVE) ×2
GLOVE BIOGEL PI INDICATOR 8 (GLOVE) ×1
GLOVE ECLIPSE 7.5 STRL STRAW (GLOVE) ×2 IMPLANT
GLOVE EXAM NITRILE LRG STRL (GLOVE) IMPLANT
GLOVE EXAM NITRILE MD LF STRL (GLOVE) IMPLANT
GLOVE EXAM NITRILE XL STR (GLOVE) IMPLANT
GLOVE EXAM NITRILE XS STR PU (GLOVE) IMPLANT
GLOVE SURG SS PI 6.5 STRL IVOR (GLOVE) ×2 IMPLANT
GOWN STRL REUS W/ TWL LRG LVL3 (GOWN DISPOSABLE) ×1 IMPLANT
GOWN STRL REUS W/ TWL XL LVL3 (GOWN DISPOSABLE) IMPLANT
GOWN STRL REUS W/TWL 2XL LVL3 (GOWN DISPOSABLE) IMPLANT
GOWN STRL REUS W/TWL LRG LVL3 (GOWN DISPOSABLE)
GOWN STRL REUS W/TWL XL LVL3 (GOWN DISPOSABLE) ×6
KIT BASIN OR (CUSTOM PROCEDURE TRAY) ×2 IMPLANT
KIT ROOM TURNOVER OR (KITS) ×2 IMPLANT
NDL HYPO 18GX1.5 BLUNT FILL (NEEDLE) IMPLANT
NDL SPNL 18GX3.5 QUINCKE PK (NEEDLE) ×1 IMPLANT
NDL SPNL 22GX3.5 QUINCKE BK (NEEDLE) ×1 IMPLANT
NEEDLE HYPO 18GX1.5 BLUNT FILL (NEEDLE) ×2 IMPLANT
NEEDLE SPNL 18GX3.5 QUINCKE PK (NEEDLE) IMPLANT
NEEDLE SPNL 22GX3.5 QUINCKE BK (NEEDLE) ×2 IMPLANT
NS IRRIG 1000ML POUR BTL (IV SOLUTION) ×2 IMPLANT
PACK LAMINECTOMY NEURO (CUSTOM PROCEDURE TRAY) ×2 IMPLANT
PAD ARMBOARD 7.5X6 YLW CONV (MISCELLANEOUS) ×8 IMPLANT
PATTIES SURGICAL .5 X1 (DISPOSABLE) ×1 IMPLANT
RUBBERBAND STERILE (MISCELLANEOUS) ×4 IMPLANT
SPONGE LAP 4X18 X RAY DECT (DISPOSABLE) IMPLANT
SPONGE SURGIFOAM ABS GEL SZ50 (HEMOSTASIS) ×2 IMPLANT
STRIP CLOSURE SKIN 1/2X4 (GAUZE/BANDAGES/DRESSINGS) IMPLANT
SUT PROLENE 6 0 BV (SUTURE) IMPLANT
SUT VIC AB 1 CT1 18XBRD ANBCTR (SUTURE) ×1 IMPLANT
SUT VIC AB 1 CT1 8-18 (SUTURE) ×4
SUT VIC AB 2-0 CP2 18 (SUTURE) ×2 IMPLANT
SUT VIC AB 3-0 SH 8-18 (SUTURE) ×1 IMPLANT
SYR 5ML LL (SYRINGE) ×1 IMPLANT
TOWEL OR 17X24 6PK STRL BLUE (TOWEL DISPOSABLE) ×2 IMPLANT
TOWEL OR 17X26 10 PK STRL BLUE (TOWEL DISPOSABLE) ×2 IMPLANT
WATER STERILE IRR 1000ML POUR (IV SOLUTION) ×2 IMPLANT

## 2016-03-20 NOTE — Transfer of Care (Signed)
Immediate Anesthesia Transfer of Care Note  Patient: [name]  Procedure(s) Performed: Procedure(s) with comments: Right - Lumbar four-five lumbar laminotomy, foraminotomy, and possible microdiscectomy (Right) - right  Patient Location: PACU  Anesthesia Type:General  Level of Consciousness: awake, oriented, sedated, patient cooperative and responds to stimulation  Airway & Oxygen Therapy: Patient Spontanous Breathing and Patient connected to nasal cannula oxygen  Post-op Assessment: Report given to RN, Post -op Vital signs reviewed and stable, Patient moving all extremities and Patient moving all extremities X 4  Post vital signs: Reviewed and stable  Last Vitals:  Vitals:   03/20/16 0851  BP: 134/63  Pulse: 67  Resp: 20  Temp: 36.5 C    Last Pain:  Vitals:   03/20/16 0851  TempSrc: Oral         Complications: No apparent anesthesia complications

## 2016-03-20 NOTE — Op Note (Signed)
03/20/2016  12:45 PM  PATIENT:  [name]  80 y.o. female  PRE-OPERATIVE DIAGNOSIS:  Right L4-5 lateral recess stenosis, lumbar spondylosis, lumbar degenerative disease, right lumbar radiculopathy  POST-OPERATIVE DIAGNOSIS:   Right L4-5 lateral recess stenosis, lumbar spondylosis, lumbar degenerative disease, right lumbar radiculopathy  PROCEDURE:  Procedure(s): Right - Lumbar four-five lumbar laminotomy and foraminotomy, with microdissection, microsurgical technique, and the operating microscope  SURGEON:  Surgeon(s): Jovita Gamma, MD Newman Pies, MD  ASSISTANTS: Newman Pies, M.D.  ANESTHESIA:   general  EBL:  Total I/O In: 100 [I.V.:100] Out: -   BLOOD ADMINISTERED:none  COUNT: Correct per nursing staff  DICTATION: Patient was brought to the operating room and placed under general endotracheal anesthesia. Patient was turned to prone position the lumbar region was prepped with Betadine soap and solution and draped in a sterile fashion. The midline was infiltrated with local anesthetic with epinephrine. A localizing x-ray was taken and the L4-5 level was identified. Midline incision was made over the L4-5 level and was carried down through the subcutaneous tissue to the lumbar fascia. The lumbar fascia was incised on the right side and the paraspinal muscles were dissected from the spinous processes and lamina in a subperiosteal fashion. Additional x-rays were taken and the right L4-5 intralaminar space was identified. The operating microscope was draped and brought into the field provided additional magnification, illumination, and visualization. L4 and L5 hemilaminectomies were performed using the high-speed drill and Kerrison punches. Medial facetectomy and foraminotomies for the exiting right L4 and L5 nerve roots were performed The ligamentum flavum was carefully resected. The underlying thecal sac and nerve roots were identified. We examined the L4-5 disc. There is  a mild focal degenerative disc bulge to the right at L4-5. However it was felt that good decompression had been achieved through the hemilaminectomies, medial facetectomy, foraminotomies, and resection of the hypertrophic ligament of flavum. It is felt that there was not sufficient benefit to be gained from proceeding with a intradiscal discectomy, and that adequate decompression of the neural structures been achieved. Good decompression of the thecal sac and nerve was confirmed. Hemostasis was established and confirmed with the use of bipolar cautery. We then instilled 2 cc of fentanyl and 80 mg of Depo-Medrol into the epidural space. Deep fascia was closed with interrupted undyed 1 Vicryl sutures. Scarpa's fascia was closed with interrupted undyed 1 Vicryl sutures in the subcutaneous and subcuticular layer were closed with interrupted inverted 2-0 and 3-0 undyed Vicryl sutures. The skin edges were approximated with Dermabond. Following surgery the patient was turned back to a supine position to be reversed from the anesthetic extubated and transferred to the recovery room for further care.   PLAN OF CARE: Admit for overnight observation  PATIENT DISPOSITION:  PACU - hemodynamically stable.   Delay start of Pharmacological VTE agent (>24hrs) due to surgical blood loss or risk of bleeding:  yes

## 2016-03-20 NOTE — H&P (Signed)
Subjective: Patient is a 80 y.o. right-handed white female who is admitted for treatment of right lumbar radiculopathy secondary to right L4-5 lateral recess stenosis, and possible superimposed right L4-5 lumbar disc herniation. Patient had a good response to a right L4-5 translaminar ESI, and we discussed the alternatives of further ESI's versus surgical decompression and the patient elected the latter, and is admitted for such. She is admitted for a right L4-5 lumbar laminotomy, foraminotomy, and possible microdiscectomy.   Patient Active Problem List   Diagnosis Date Noted  . Scalp pain 10/22/2015  . Bilateral back pain 10/11/2015  . Post-traumatic headache 10/11/2015  . Routine health maintenance 03/06/2013  . Macular degeneration of right eye 03/03/2013  . Glaucoma 03/03/2013  . Hyperlipidemia 03/14/2009  . OVERWEIGHT 03/14/2009  . NEURALGIA 03/13/2009  . Depression 10/19/2007  . Essential hypertension 10/19/2007  . GASTROESOPHAGEAL REFLUX DISEASE 10/19/2007  . OSTEOARTHRITIS 10/19/2007  . STRESS INCONTINENCE 10/19/2007  . ALLERGY 10/19/2007   Past Medical History:  Diagnosis Date  . Colon polyps   . Complication of anesthesia    takes longer to wake up- admitted overnight after colonoscopy  . Depression   . GERD (gastroesophageal reflux disease)   . Glaucoma   . Headache    migraines  . Hx: UTI (urinary tract infection)   . Hypercholesteremia   . Hypertension   . Lichen simplex chronicus   . Macular degeneration   . Osteoarthritis   . Osteopenia    of the hip  . Stress incontinence, female   . Unsteady gait   . Urinary urgency   . Wears glasses   . Whooping cough    as a baby    Past Surgical History:  Procedure Laterality Date  . ABDOMINAL HYSTERECTOMY     ovaries remain  . APPENDECTOMY    . BLADDER REPAIR     x two  . CHOLECYSTECTOMY    . COLONOSCOPY    . EYE SURGERY  11/14, 12/14   Macular Dengeneration, Glaucoma  . EYE SURGERY  2016   left eye  .  LESION REMOVAL Right 06/28/2013   Procedure: EXCISION 3 cm right labial sebaceous cyst;  Surgeon: Lyman Speller, MD;  Location: [facility];  Service: Gynecology;  Laterality: Right;  . MOHS SURGERY     procedure to remove basal cell  . TONSILLECTOMY AND ADENOIDECTOMY    . URETHRAL SLING  2007  . URETHRAL SLING  1/12   midurethral   . vertebroplasty secondary to traumatic compression fracture      Prescriptions Prior to Admission  Medication Sig Dispense Refill Last Dose  . acetaminophen (TYLENOL) 500 MG tablet Take 1,000 mg by mouth every 6 (six) hours as needed for headache.   03/20/2016 at 0630  . aspirin 81 MG tablet Take 81 mg by mouth daily.    03/16/2016  . COMBIGAN 0.2-0.5 % ophthalmic solution Place 1 drop into both eyes daily.   0 03/20/2016 at Unknown time  . ezetimibe-simvastatin (VYTORIN) 10-20 MG per tablet Take 1 tablet by mouth daily. 90 tablet 2 03/19/2016 at Unknown time  . latanoprost (XALATAN) 0.005 % ophthalmic solution Place 1 drop into both eyes at bedtime.   0 03/20/2016 at Unknown time  . nadolol (CORGARD) 40 MG tablet take 1 tablet by mouth once daily at bedtime 30 tablet 1 03/19/2016 at Unknown time  . omeprazole (PRILOSEC) 10 MG capsule Take 10 mg by mouth daily.   03/20/2016 at 0630  . traZODone (DESYREL) 100 MG  tablet Take 25 mg by mouth at bedtime. Reported on 10/22/2015   03/19/2016 at Unknown time  . venlafaxine (EFFEXOR-XR) 150 MG 24 hr capsule Take 150 mg by mouth 2 (two) times daily.     03/20/2016 at 0630   Allergies  Allergen Reactions  . Codeine Nausea And Vomiting    Social History  Substance Use Topics  . Smoking status: Former Smoker    Quit date: 08/11/1965  . Smokeless tobacco: Never Used  . Alcohol use 0.6 oz/week    1 Standard drinks or equivalent per week     Comment: occ     Family History  Problem Relation Age of Onset  . Congestive Heart Failure Mother   . Thyroid disease Mother   . Osteoporosis Mother   . Alcoholism Mother   . Breast cancer  Maternal Grandmother   . Hypertension Sister   . Thyroid disease Sister   . Rheum arthritis Daughter      Review of Systems A comprehensive review of systems was negative.  Objective: Vital signs in last 24 hours: Temp:  [97.7 F (36.5 C)] 97.7 F (36.5 C) (08/10 0851) Pulse Rate:  [67] 67 (08/10 0851) Resp:  [20] 20 (08/10 0851) BP: (134)/(63) 134/63 (08/10 0851) SpO2:  [97 %] 97 % (08/10 0851) Weight:  [73.5 kg (162 lb)] 73.5 kg (162 lb) (08/10 0851)  EXAM: Patient well-developed well-nourished white female in no acute distress. Lungs are clear to auscultation , the patient has symmetrical respiratory excursion. Heart has a regular rate and rhythm normal S1 and S2 no murmur.   Abdomen is soft nontender nondistended bowel sounds are present. Extremity examination shows no clubbing cyanosis or edema. Motor examination shows 5 over 5 strength in the lower extremities including the iliopsoas quadriceps dorsiflexor extensor hallicus  longus and plantar flexor bilaterally. Sensation is intact to pinprick in the distal lower extremities. Reflexes are symmetrical bilaterally. No pathologic reflexes are present. Patient has a normal gait and stance.   Data Review:CBC    Component Value Date/Time   WBC 7.7 03/18/2016 1051   RBC 4.51 03/18/2016 1051   HGB 14.0 03/18/2016 1051   HCT 45.1 03/18/2016 1051   PLT 221 03/18/2016 1051   MCV 100.0 03/18/2016 1051   MCH 31.0 03/18/2016 1051   MCHC 31.0 03/18/2016 1051   RDW 14.5 03/18/2016 1051   LYMPHSABS 1.5 03/03/2013 1001   MONOABS 0.4 03/03/2013 1001   EOSABS 0.0 03/03/2013 1001   BASOSABS 0.0 03/03/2013 1001                          BMET    Component Value Date/Time   NA 140 03/18/2016 1051   K 4.0 03/18/2016 1051   CL 104 03/18/2016 1051   CO2 30 03/18/2016 1051   GLUCOSE 102 (H) 03/18/2016 1051   BUN 11 03/18/2016 1051   CREATININE 0.76 03/18/2016 1051   CALCIUM 9.6 03/18/2016 1051   GFRNONAA >60 03/18/2016 1051   GFRAA  >60 03/18/2016 1051     Assessment/Plan: Patient with right lumbar radiculopathy secondary to right L4-5 lateral recess stenosis and possible some post disc herniation, who is admitted for a right L4-5 lumbar laminotomy, foraminotomy, and possible micro-discectomy.  I've discussed with the patient the nature of his condition, the nature the surgical procedure, the typical length of surgery, hospital stay, and overall recuperation. We discussed limitations postoperatively. I discussed risks of surgery including risks of infection, bleeding, possibly need  for transfusion, the risk of nerve root dysfunction with pain, weakness, numbness, or paresthesias, or risk of dural tear and CSF leakage and possible need for further surgery, the risk of recurrent disc herniation and the possible need for further surgery, and the risk of anesthetic complications including myocardial infarction, stroke, pneumonia, and death. Understanding all this the patient does wish to proceed with surgery and is admitted for such.    Hosie Spangle, MD 03/20/2016 10:24 AM

## 2016-03-20 NOTE — Anesthesia Procedure Notes (Signed)
Procedure Name: Intubation Date/Time: 03/20/2016 11:18 AM Performed by: Jacquiline Doe A Pre-anesthesia Checklist: Patient identified, Emergency Drugs available, Suction available and Patient being monitored Patient Re-evaluated:Patient Re-evaluated prior to inductionOxygen Delivery Method: Circle System Utilized Preoxygenation: Pre-oxygenation with 100% oxygen Intubation Type: IV induction Ventilation: Mask ventilation without difficulty and Oral airway inserted - appropriate to patient size Laryngoscope Size: Mac and 3 Grade View: Grade I Tube type: Oral Tube size: 7.0 mm Number of attempts: 1 Airway Equipment and Method: Stylet and Oral airway Placement Confirmation: ETT inserted through vocal cords under direct vision,  positive ETCO2 and breath sounds checked- equal and bilateral Secured at: 21 cm Tube secured with: Tape Dental Injury: Teeth and Oropharynx as per pre-operative assessment

## 2016-03-20 NOTE — Progress Notes (Signed)
Vitals:   03/20/16 1337 03/20/16 1400 03/20/16 1429 03/20/16 1655  BP: 132/74  128/60 130/62  Pulse: 60  73 82  Resp: (!) 30  20 20   Temp:  97.2 F (36.2 C) 97.8 F (36.6 C) 98.2 F (36.8 C)  TempSrc:   Oral Oral  SpO2: 98%   96%  Weight:      Height:        CBC  Recent Labs  03/18/16 1051  WBC 7.7  HGB 14.0  HCT 45.1  PLT 221   BMET  Recent Labs  03/18/16 1051  NA 140  K 4.0  CL 104  CO2 30  GLUCOSE 102*  BUN 11  CREATININE 0.76  CALCIUM 9.6    Patient doing well. Has been up and ambulate. Voiding well. Wound clean and dry.  Plan: Encouraged to ambulate. Will continue to progress through postoperative recovery.  Hosie Spangle, MD 03/20/2016, 6:27 PM

## 2016-03-20 NOTE — Anesthesia Postprocedure Evaluation (Signed)
Anesthesia Post Note  Patient: [name]  Procedure(s) Performed: Procedure(s) (LRB): Right - Lumbar four-five lumbar laminotomy, foraminotomy, and possible microdiscectomy (Right)  Patient location during evaluation: PACU Anesthesia Type: General Level of consciousness: awake and alert Pain management: pain level controlled Vital Signs Assessment: post-procedure vital signs reviewed and stable Respiratory status: spontaneous breathing, nonlabored ventilation, respiratory function stable and patient connected to nasal cannula oxygen Cardiovascular status: blood pressure returned to baseline and stable Postop Assessment: no signs of nausea or vomiting Anesthetic complications: no    Last Vitals:  Vitals:   03/20/16 1337 03/20/16 1400  BP: 132/74   Pulse: 60   Resp: (!) 30   Temp:  36.2 C    Last Pain:  Vitals:   03/20/16 0851  TempSrc: Oral    LLE Motor Response: Purposeful movement;Responds to commands (03/20/16 1400) LLE Sensation: No numbness (03/20/16 1400) RLE Motor Response: Purposeful movement;Responds to commands (03/20/16 1400) RLE Sensation: No numbness (03/20/16 1400)      Effie Berkshire

## 2016-03-20 NOTE — Anesthesia Preprocedure Evaluation (Addendum)
Anesthesia Evaluation  Patient identified by MRN, date of birth, ID band Patient awake    Reviewed: Allergy & Precautions, NPO status , Patient's Chart, lab work & pertinent test results  Airway Mallampati: I  TM Distance: >3 FB Neck ROM: Full    Dental  (+) Teeth Intact, Dental Advisory Given   Pulmonary former smoker,    breath sounds clear to auscultation       Cardiovascular hypertension,  Rhythm:Regular Rate:Normal     Neuro/Psych  Headaches, PSYCHIATRIC DISORDERS Depression    GI/Hepatic Neg liver ROS, GERD  Medicated,  Endo/Other  negative endocrine ROS  Renal/GU negative Renal ROS  negative genitourinary   Musculoskeletal  (+) Arthritis ,   Abdominal   Peds negative pediatric ROS (+)  Hematology negative hematology ROS (+)   Anesthesia Other Findings   Reproductive/Obstetrics negative OB ROS                            Lab Results  Component Value Date   WBC 7.7 03/18/2016   HGB 14.0 03/18/2016   HCT 45.1 03/18/2016   MCV 100.0 03/18/2016   PLT 221 03/18/2016   No results found for: INR, PROTIME  10/2015 EKG: normal sinus rhythm.   Anesthesia Physical Anesthesia Plan  ASA: II  Anesthesia Plan: General   Post-op Pain Management:    Induction: Intravenous  Airway Management Planned: Oral ETT  Additional Equipment:   Intra-op Plan:   Post-operative Plan: Extubation in OR  Informed Consent: I have reviewed the patients History and Physical, chart, labs and discussed the procedure including the risks, benefits and alternatives for the proposed anesthesia with the patient or authorized representative who has indicated his/her understanding and acceptance.   Dental advisory given  Plan Discussed with: CRNA  Anesthesia Plan Comments:         Anesthesia Quick Evaluation

## 2016-03-21 ENCOUNTER — Encounter (HOSPITAL_COMMUNITY): Payer: Self-pay | Admitting: Neurosurgery

## 2016-03-21 DIAGNOSIS — M4806 Spinal stenosis, lumbar region: Secondary | ICD-10-CM | POA: Diagnosis not present

## 2016-03-21 MED ORDER — HYDROCODONE-ACETAMINOPHEN 5-325 MG PO TABS: 0.5000 | ORAL_TABLET | ORAL | 0 refills | Status: DC | PRN

## 2016-03-21 NOTE — Progress Notes (Signed)
Pt doing well. Pt and husband given D/C instructions with Rx, verbal understanding was provided. Pt's IV was removed prior to D/C/ Pt's incision is clean and dry with no sign of infection. Pt D/C'd home via wheelchair @ 1030 per MD order. Pt is stable @ D/C and has no other needs at this time. Holli Humbles, RN

## 2016-03-21 NOTE — Discharge Summary (Signed)
Physician Discharge Summary  Patient ID: [name] MRN: [id number] DOB/AGE: [birth year] 80 y.o.  Admit date: 03/20/2016 Discharge date: 03/21/2016  Admission Diagnoses:  Right L4-5 lateral recess stenosis, lumbar spondylosis, lumbar degenerative disease, right lumbar radiculopathy  Discharge Diagnoses:  Right L4-5 lateral recess stenosis, lumbar spondylosis, lumbar degenerative disease, right lumbar radiculopathy Active Problems:   Lumbar stenosis with neurogenic claudication   Discharged Condition: good  Hospital Course: Patient was admitted, underwent a right L4-5 lumbar laminotomy and foraminotomy. She is done well following surgery. She's had excellent relief of her radicular pain. Her incision is healing nicely there is some mild ecchymosis in the surrounding soft tissues, but no swelling, erythema, or drainage. She is up and ambulating actively. She is asking to be discharged to home. She and her husband been given instructions regarding wound care and activities. She is scheduled to follow-up with me in the office in 3 weeks.  Discharge Exam: Blood pressure (!) 104/50, pulse 62, temperature 98.1 F (36.7 C), temperature source Oral, resp. rate 18, height 5' (1.524 m), weight 73.5 kg (162 lb), last menstrual period 08/11/1974, SpO2 99 %.  Disposition: 01-Home or Self Care     Medication List    TAKE these medications   acetaminophen 500 MG tablet Commonly known as:  TYLENOL Take 1,000 mg by mouth every 6 (six) hours as needed for headache.   aspirin 81 MG tablet Take 81 mg by mouth daily.   COMBIGAN 0.2-0.5 % ophthalmic solution Generic drug:  brimonidine-timolol Place 1 drop into both eyes daily.   ezetimibe-simvastatin 10-20 MG tablet Commonly known as:  VYTORIN Take 1 tablet by mouth daily.   HYDROcodone-acetaminophen 5-325 MG tablet Commonly known as:  NORCO/VICODIN Take 0.5-1 tablets by mouth every 4 (four) hours as needed (pain).   latanoprost 0.005 %  ophthalmic solution Commonly known as:  XALATAN Place 1 drop into both eyes at bedtime.   nadolol 40 MG tablet Commonly known as:  CORGARD take 1 tablet by mouth once daily at bedtime   omeprazole 10 MG capsule Commonly known as:  PRILOSEC Take 10 mg by mouth daily.   traZODone 100 MG tablet Commonly known as:  DESYREL Take 25 mg by mouth at bedtime. Reported on 10/22/2015   venlafaxine XR 150 MG 24 hr capsule Commonly known as:  EFFEXOR-XR Take 150 mg by mouth 2 (two) times daily.        SignedHosie Spangle 03/21/2016, 8:28 AM

## 2016-03-21 NOTE — Discharge Instructions (Signed)

## 2016-03-27 ENCOUNTER — Encounter: Payer: Self-pay | Admitting: Obstetrics & Gynecology

## 2016-03-27 ENCOUNTER — Telehealth: Payer: Self-pay | Admitting: Obstetrics & Gynecology

## 2016-03-27 ENCOUNTER — Ambulatory Visit (INDEPENDENT_AMBULATORY_CARE_PROVIDER_SITE_OTHER): Payer: Medicare Other | Admitting: Obstetrics & Gynecology

## 2016-03-27 VITALS — BP 138/64 | HR 64 | Resp 16 | Ht 61.0 in | Wt 166.4 lb

## 2016-03-27 DIAGNOSIS — R310 Gross hematuria: Secondary | ICD-10-CM

## 2016-03-27 DIAGNOSIS — N390 Urinary tract infection, site not specified: Secondary | ICD-10-CM | POA: Diagnosis not present

## 2016-03-27 MED ORDER — SULFAMETHOXAZOLE-TRIMETHOPRIM 800-160 MG PO TABS: 1.0000 | ORAL_TABLET | Freq: Two times a day (BID) | ORAL | 0 refills | Status: DC

## 2016-03-27 NOTE — Progress Notes (Signed)
GYNECOLOGY  VISIT   HPI: 80 y.o. G4P2 Married Caucasian female with increased dysuria and increased urinary hesitancy starting last night.  She reports she also had associated pain with this as well.  This morning, when voiding, she saw bright red blood in the toilet and passed some tissue in her urine.  She does not have a fever.  She denies back pain.    Pt has known hx of large cystocele and urinary incontinence.  She wears Depends (or equivalent) for this.  GYNECOLOGIC HISTORY: Patient's last menstrual period was 08/11/1974. Menopausal hormone therapy: none  Patient Active Problem List   Diagnosis Date Noted  . Lumbar stenosis with neurogenic claudication 03/20/2016  . Scalp pain 10/22/2015  . Bilateral back pain 10/11/2015  . Post-traumatic headache 10/11/2015  . Routine health maintenance 03/06/2013  . Macular degeneration of right eye 03/03/2013  . Glaucoma 03/03/2013  . Hyperlipidemia 03/14/2009  . OVERWEIGHT 03/14/2009  . NEURALGIA 03/13/2009  . Depression 10/19/2007  . Essential hypertension 10/19/2007  . GASTROESOPHAGEAL REFLUX DISEASE 10/19/2007  . OSTEOARTHRITIS 10/19/2007  . STRESS INCONTINENCE 10/19/2007  . ALLERGY 10/19/2007    Past Medical History:  Diagnosis Date  . Colon polyps   . Complication of anesthesia    takes longer to wake up- admitted overnight after colonoscopy  . Depression   . GERD (gastroesophageal reflux disease)   . Glaucoma   . Headache    migraines  . Hx: UTI (urinary tract infection)   . Hypercholesteremia   . Hypertension   . Lichen simplex chronicus   . Macular degeneration   . Osteoarthritis   . Osteopenia    of the hip  . Stress incontinence, female   . Unsteady gait   . Urinary urgency   . Wears glasses   . Whooping cough    as a baby    Past Surgical History:  Procedure Laterality Date  . ABDOMINAL HYSTERECTOMY     ovaries remain  . APPENDECTOMY    . BLADDER REPAIR     x two  . CHOLECYSTECTOMY    .  COLONOSCOPY    . EYE SURGERY  11/14, 12/14   Macular Dengeneration, Glaucoma  . EYE SURGERY  2016   left eye  . LESION REMOVAL Right 06/28/2013   Procedure: EXCISION 3 cm right labial sebaceous cyst;  Surgeon: Lyman Speller, MD;  Location: [facility];  Service: Gynecology;  Laterality: Right;  . LUMBAR LAMINECTOMY/DECOMPRESSION MICRODISCECTOMY Right 03/20/2016   Procedure: Right - Lumbar four-five lumbar laminotomy, foraminotomy, and possible microdiscectomy;  Surgeon: Jovita Gamma, MD;  Location: [facility];  Service: Neurosurgery;  Laterality: Right;  right  . MOHS SURGERY     procedure to remove basal cell  . TONSILLECTOMY AND ADENOIDECTOMY    . URETHRAL SLING  2007  . URETHRAL SLING  1/12   midurethral   . vertebroplasty secondary to traumatic compression fracture      MEDS:  Reviewed in EPIC and UTD  ALLERGIES: Codeine  Family History  Problem Relation Age of Onset  . Congestive Heart Failure Mother   . Thyroid disease Mother   . Osteoporosis Mother   . Alcoholism Mother   . Breast cancer Maternal Grandmother   . Hypertension Sister   . Thyroid disease Sister   . Rheum arthritis Daughter     SH:  Married, non smoker  Review of Systems  Constitutional: Negative for chills, fever and weight loss.  Genitourinary: Positive for dysuria, hematuria and urgency. Negative for  flank pain.  All other systems reviewed and are negative.   PHYSICAL EXAMINATION:    BP 138/64 (BP Location: Right Arm, Patient Position: Sitting)   Pulse 64   Resp 16   Ht 5\' 1"  (1.549 m)   Wt 166 lb 6.4 oz (75.5 kg)   LMP 08/11/1974   BMI 31.44 kg/m     General appearance: alert, cooperative and appears stated age Flank: No CVA tenderness ` Abdomen: soft, non-tender; bowel sounds normal; no masses,  no organomegaly  Pelvic: External genitalia:  normal              Urethra:  normal appearing urethra with no masses, tenderness or lesions              Bartholins and Skenes: normal                  Vagina: normal appearing vagina with normal color and discharge, no lesions, large cystocele, No blood present              Cervix: absent              Bimanual Exam:  Uterus:  uterus absent              Adnexa: no mass, fullness, tenderness   Chaperone was present for exam.  Assessment: Gross hematuria Possible UTI  Plan: Urine culture pending.   Bactrim DS Bid x 5 days.  Rx to pharmacy.   May need urology referral due to amt of blood in urine.

## 2016-03-27 NOTE — Telephone Encounter (Signed)
Spoke with patient. Patient reports she woke up last night and noticed her "incontience pad" had blood in it. Reports when she uses the restroom she has bladder pressure and burning. Denies any fever or chills. Advised she will need to be seen in the office for further evaluation. She is agreeable. Requests to only see Dr.Miller. Appointment scheduled for today 03/27/2016 at 11:15 am. She is agreeable to date and time.   Routing to provider for final review. Patient agreeable to disposition. Will close encounter.

## 2016-03-27 NOTE — Telephone Encounter (Signed)
Patient is having some bleeding and would like to talk with Dr.Miller's nurse ASAP.

## 2016-03-28 ENCOUNTER — Encounter: Payer: Self-pay | Admitting: Neurology

## 2016-03-28 ENCOUNTER — Ambulatory Visit (INDEPENDENT_AMBULATORY_CARE_PROVIDER_SITE_OTHER): Payer: Medicare Other | Admitting: Neurology

## 2016-03-28 VITALS — BP 123/73 | HR 65 | Ht 60.0 in | Wt 166.5 lb

## 2016-03-28 DIAGNOSIS — R519 Headache, unspecified: Secondary | ICD-10-CM

## 2016-03-28 DIAGNOSIS — R269 Unspecified abnormalities of gait and mobility: Secondary | ICD-10-CM

## 2016-03-28 DIAGNOSIS — R51 Headache: Secondary | ICD-10-CM | POA: Diagnosis not present

## 2016-03-28 DIAGNOSIS — E538 Deficiency of other specified B group vitamins: Secondary | ICD-10-CM | POA: Diagnosis not present

## 2016-03-28 DIAGNOSIS — R413 Other amnesia: Secondary | ICD-10-CM

## 2016-03-28 NOTE — Progress Notes (Signed)
Reason for visit: headache  Referring physician: Dr. Mellody Dance is a 80 y.o. female  History of present illness:  [name] is a 80 year old right-handed white female with a history of a chronic mild gait disorder, episodic falls over several years. The patient fell around 10/19/2015. The patient fell backwards and hit the back of her head on the right side. The patient had a laceration, and went to the emergency room for evaluation. She did not lose consciousness with the event. The patient has had a CT scan of the brain and of the cervical spine, no evidence of fracture was noted. The patient has had some mild discomfort in the back of the head around the site of the laceration. This is not present at all times, she may have 4 out of 7 days of the week with some discomfort, she will take a Tylenol and the headache will go away within 30 minutes. The patient has not restricted her activities secondary to the headache. She denies any visual changes, confusion, or numbness or weakness in arms or legs. The patient does have a mild memory disturbance, this was present prior to the fall, and has continued since. The patient has a nonhealing scab on the back of the head that she is concerned about. The discomfort in the head is around that area. The patient has a pre-existing history of migraine, but she has not had any migraine headaches since the 1980s.  Past Medical History:  Diagnosis Date  . Anxiety   . Colon polyps   . Complication of anesthesia    takes longer to wake up- admitted overnight after colonoscopy  . Depression   . GERD (gastroesophageal reflux disease)   . Glaucoma   . Headache    migraines  . Hx: UTI (urinary tract infection)   . Hypercholesteremia   . Hypertension   . Lichen simplex chronicus   . Macular degeneration   . Osteoarthritis   . Osteopenia    of the hip  . Stress incontinence, female   . Unsteady gait   . Urinary urgency   . Wears  glasses   . Whooping cough    as a baby    Past Surgical History:  Procedure Laterality Date  . ABDOMINAL HYSTERECTOMY     ovaries remain  . APPENDECTOMY    . BLADDER REPAIR     x two  . CHOLECYSTECTOMY    . COLONOSCOPY    . EYE SURGERY  11/14, 12/14   Macular Dengeneration, Glaucoma  . EYE SURGERY  2016   left eye  . LESION REMOVAL Right 06/28/2013   Procedure: EXCISION 3 cm right labial sebaceous cyst;  Surgeon: Lyman Speller, MD;  Location: [facility];  Service: Gynecology;  Laterality: Right;  . LUMBAR LAMINECTOMY/DECOMPRESSION MICRODISCECTOMY Right 03/20/2016   Procedure: Right - Lumbar four-five lumbar laminotomy, foraminotomy, and possible microdiscectomy;  Surgeon: Jovita Gamma, MD;  Location: [facility];  Service: Neurosurgery;  Laterality: Right;  right  . MOHS SURGERY     procedure to remove basal cell  . TONSILLECTOMY AND ADENOIDECTOMY    . URETHRAL SLING  2007  . URETHRAL SLING  1/12   midurethral   . vertebroplasty secondary to traumatic compression fracture      Family History  Problem Relation Age of Onset  . Congestive Heart Failure Mother   . Thyroid disease Mother   . Osteoporosis Mother   . Alcoholism Mother   . Breast  cancer Maternal Grandmother   . Hypertension Sister   . Thyroid disease Sister   . Rheum arthritis Daughter     Social history:  reports that she quit smoking about 50 years ago. She has never used smokeless tobacco. She reports that she drinks about 0.6 oz of alcohol per week . She reports that she does not use drugs.  Medications:  Prior to Admission medications   Medication Sig Start Date End Date Taking? Authorizing Provider  acetaminophen (TYLENOL) 500 MG tablet Take 1,000 mg by mouth every 6 (six) hours as needed for headache.    Historical Provider, MD  aspirin 81 MG tablet Take 81 mg by mouth daily.     Historical Provider, MD  clobetasol ointment (TEMOVATE) 0.05 % apply to affected area twice a day use N9O MORE THAN 2  WEEKS 12/29/15   Historical Provider, MD  COMBIGAN 0.2-0.5 % ophthalmic solution Place 1 drop into both eyes daily.  09/05/14   Historical Provider, MD  ezetimibe-simvastatin (VYTORIN) 10-20 MG per tablet Take 1 tablet by mouth daily. 04/11/15   Hoyt Koch, MD  HYDROcodone-acetaminophen (NORCO/VICODIN) 5-325 MG tablet Take 0.5-1 tablets by mouth every 4 (four) hours as needed (pain). 03/21/16   Jovita Gamma, MD  latanoprost (XALATAN) 0.005 % ophthalmic solution Place 1 drop into both eyes at bedtime.  09/05/14   Historical Provider, MD  nadolol (CORGARD) 40 MG tablet take 1 tablet by mouth once daily at bedtime 03/07/16   Hoyt Koch, MD  omeprazole (PRILOSEC) 10 MG capsule Take 10 mg by mouth daily.    Historical Provider, MD  sulfamethoxazole-trimethoprim (BACTRIM DS) 800-160 MG tablet Take 1 tablet by mouth 2 (two) times daily. 03/27/16   Megan Salon, MD  traZODone (DESYREL) 100 MG tablet Take 25 mg by mouth at bedtime. Reported on 10/22/2015    Historical Provider, MD  venlafaxine (EFFEXOR-XR) 150 MG 24 hr capsule Take 150 mg by mouth 2 (two) times daily.      Historical Provider, MD      Allergies  Allergen Reactions  . Codeine Nausea And Vomiting    ROS:  Out of a complete 14 system review of symptoms, the patient complains only of the following symptoms, and all other reviewed systems are negative.  Difficulty swallowing Incontinence of the bladder Allergies Headache, difficulty swallowing, dizziness Anxiety  Height 5' (1.524 m), weight 166 lb 8 oz (75.5 kg), last menstrual period 08/11/1974.  Physical Exam  General: The patient is alert and cooperative at the time of the examination. The patient is moderately obese.  Eyes: Pupils are equal, round, and reactive to light. Discs are flat bilaterally.  Ears: Tympanic membrane on the right is clear, there is a wax plug on the left.  Neck: The neck is supple, no carotid bruits are noted.  Respiratory: The  respiratory examination is clear.  Cardiovascular: The cardiovascular examination reveals a regular rate and rhythm, no obvious murmurs or rubs are noted.  Skin: Extremities are without significant edema.  Neurologic Exam  Mental status: The patient is alert and oriented x 3 at the time of the examination. The patient has apparent normal recent and remote memory, with an apparently normal attention span and concentration ability.  Cranial nerves: Facial symmetry is present. There is good sensation of the face to pinprick and soft touch bilaterally. The strength of the facial muscles and the muscles to head turning and shoulder shrug are normal bilaterally. Speech is well enunciated, no aphasia or dysarthria  is noted. Extraocular movements are full. Visual fields are full. The tongue is midline, and the patient has symmetric elevation of the soft palate. No obvious hearing deficits are noted.  Motor: The motor testing reveals 5 over 5 strength of all 4 extremities. Good symmetric motor tone is noted throughout.  Sensory: Sensory testing is intact to pinprick, soft touch, vibration sensation, and position sense on all 4 extremities. No evidence of extinction is noted.  Coordination: Cerebellar testing reveals good finger-nose-finger and heel-to-shin bilaterally.  Gait and station: Gait is normal. Tandem gait is slightly unsteady. Romberg is negative. No drift is seen.  Reflexes: Deep tendon reflexes are symmetric and normal bilaterally. Toes are downgoing bilaterally.   CT head, CT cervical 10/19/15:  IMPRESSION: 1. Vertex scalp hematoma without subjacent fracture or acute intracranial abnormality. 2. Degenerative change in the cervical spine without acute fracture or subluxation  * CT scan images were reviewed online. I agree with the written report.    Assessment/Plan:  1. History fall with right occipital laceration  2. Headaches following fall  The patient has a pre-existing  history of some balance issues, and some mild memory issues. The patient may benefit from physical therapy in the future for her balance. The patient has mild headaches in the right occipital area, this is not disabling for her. The patient is mainly concerned by a nonhealing skin wound on the right occipital region. It is possible the patient may have some foreign body in the scalp that is not allowing the wound to heal completely. The patient will be sent for MRI of the brain, she will have blood work today.  Jill Alexanders MD 03/28/2016 11:46 AM  [facility] [address] [place]  [place], [place] [address]  Phone [phone] Fax [phone]

## 2016-03-28 NOTE — Patient Instructions (Addendum)
Fall Prevention in the Home  Falls can cause injuries and can affect people from all age groups. There are many simple things that you can do to make your home safe and to help prevent falls. WHAT CAN I DO ON THE OUTSIDE OF MY HOME?  Regularly repair the edges of walkways and driveways and fix any cracks.  Remove high doorway thresholds.  Trim any shrubbery on the main path into your home.  Use bright outdoor lighting.  Clear walkways of debris and clutter, including tools and rocks.  Regularly check that handrails are securely fastened and in good repair. Both sides of any steps should have handrails.  Install guardrails along the edges of any raised decks or porches.  Have leaves, snow, and ice cleared regularly.  Use sand or salt on walkways during winter months.  In the garage, clean up any spills right away, including grease or oil spills. WHAT CAN I DO IN THE BATHROOM?  Use night lights.  Install grab bars by the toilet and in the tub and shower. Do not use towel bars as grab bars.  Use non-skid mats or decals on the floor of the tub or shower.  If you need to sit down while you are in the shower, use a plastic, non-slip stool..  Keep the floor dry. Immediately clean up any water that spills on the floor.  Remove soap buildup in the tub or shower on a regular basis.  Attach bath mats securely with double-sided non-slip rug tape.  Remove throw rugs and other tripping hazards from the floor. WHAT CAN I DO IN THE BEDROOM?  Use night lights.  Make sure that a bedside light is easy to reach.  Do not use oversized bedding that drapes onto the floor.  Have a firm chair that has side arms to use for getting dressed.  Remove throw rugs and other tripping hazards from the floor. WHAT CAN I DO IN THE KITCHEN?   Clean up any spills right away.  Avoid walking on wet floors.  Place frequently used items in easy-to-reach places.  If you need to reach for something  above you, use a sturdy step stool that has a grab bar.  Keep electrical cables out of the way.  Do not use floor polish or wax that makes floors slippery. If you have to use wax, make sure that it is non-skid floor wax.  Remove throw rugs and other tripping hazards from the floor. WHAT CAN I DO IN THE STAIRWAYS?  Do not leave any items on the stairs.  Make sure that there are handrails on both sides of the stairs. Fix handrails that are broken or loose. Make sure that handrails are as long as the stairways.  Check any carpeting to make sure that it is firmly attached to the stairs. Fix any carpet that is loose or worn.  Avoid having throw rugs at the top or bottom of stairways, or secure the rugs with carpet tape to prevent them from moving.  Make sure that you have a light switch at the top of the stairs and the bottom of the stairs. If you do not have them, have them installed. WHAT ARE SOME OTHER FALL PREVENTION TIPS?  Wear closed-toe shoes that fit well and support your feet. Wear shoes that have rubber soles or low heels.  When you use a stepladder, make sure that it is completely opened and that the sides are firmly locked. Have someone hold the ladder while you   are using it. Do not climb a closed stepladder.  Add color or contrast paint or tape to grab bars and handrails in your home. Place contrasting color strips on the first and last steps.  Use mobility aids as needed, such as canes, walkers, scooters, and crutches.  Turn on lights if it is dark. Replace any light bulbs that burn out.  Set up furniture so that there are clear paths. Keep the furniture in the same spot.  Fix any uneven floor surfaces.  Choose a carpet design that does not hide the edge of steps of a stairway.  Be aware of any and all pets.  Review your medicines with your healthcare provider. Some medicines can cause dizziness or changes in blood pressure, which increase your risk of falling. Talk  with your health care provider about other ways that you can decrease your risk of falls. This may include working with a physical therapist or trainer to improve your strength, balance, and endurance.   This information is not intended to replace advice given to you by your health care provider. Make sure you discuss any questions you have with your health care provider.   Document Released: 07/18/2002 Document Revised: 12/12/2014 Document Reviewed: 09/01/2014 Elsevier Interactive Patient Education 2016 Elsevier Inc.  

## 2016-03-29 LAB — URINE CULTURE: Organism ID, Bacteria: 10000

## 2016-03-29 LAB — VITAMIN B12: VITAMIN B 12: 717 pg/mL (ref 211–946)

## 2016-03-29 LAB — RPR: RPR: NONREACTIVE

## 2016-03-29 LAB — SEDIMENTATION RATE: Sed Rate: 2 mm/hr (ref 0–40)

## 2016-03-29 LAB — COPPER, SERUM: Copper: 130 ug/dL (ref 72–166)

## 2016-03-31 ENCOUNTER — Telehealth: Payer: Self-pay | Admitting: *Deleted

## 2016-03-31 DIAGNOSIS — R300 Dysuria: Secondary | ICD-10-CM

## 2016-03-31 NOTE — Telephone Encounter (Signed)
-----   Message from Megan Salon, MD sent at 03/29/2016  7:59 AM EDT ----- Please call pt.  Urine culture was negative for clear cause of symptoms.  She needs repeat urine micro.  If blood present, will need referral to urology

## 2016-03-31 NOTE — Telephone Encounter (Signed)
Patient returned call and notified of urine culture results. Patient verbalized understanding. RN stated to patient Dr. Sabra Heck wanted to repeat urine micro. Patient agreeable to schedule and states Tuesdays or Thursdays are best. Nurse visit scheduled for Tuesday 04/01/16 at 1400. Patient agreeable to date and time.  Routing to provider for final review. Patient agreeable to disposition. Will close encounter.

## 2016-03-31 NOTE — Telephone Encounter (Signed)
Future order placed for urine micro. Patient aware if urine micro shows blood, patient will need a referral to urology. Patient verbalized understanding.   Routing to provider for final review. Patient agreeable to disposition. Will close encounter.

## 2016-03-31 NOTE — Telephone Encounter (Signed)
Message left to return call to Danyla Wattley at [phone].    

## 2016-04-01 ENCOUNTER — Ambulatory Visit (INDEPENDENT_AMBULATORY_CARE_PROVIDER_SITE_OTHER): Payer: Medicare Other

## 2016-04-01 DIAGNOSIS — R300 Dysuria: Secondary | ICD-10-CM

## 2016-04-01 NOTE — Progress Notes (Signed)
Patient here for Urine micro; patient has no complaints at time of visit. Urine was obtained in a hat per patient request and then placed in specimen cup. Urine sent to lab for Micro.

## 2016-04-02 ENCOUNTER — Telehealth: Payer: Self-pay | Admitting: Obstetrics & Gynecology

## 2016-04-02 LAB — URINALYSIS, MICROSCOPIC ONLY
Bacteria, UA: NONE SEEN [HPF]
CASTS: NONE SEEN [LPF]
RBC / HPF: NONE SEEN RBC/HPF (ref <=2)
WBC, UA: NONE SEEN WBC/HPF (ref <=5)
YEAST: NONE SEEN [HPF]

## 2016-04-02 NOTE — Telephone Encounter (Signed)
Patient has some questions regarding her recent urine results.

## 2016-04-02 NOTE — Telephone Encounter (Signed)
Return call to patient. Requesting to speak with Dr Sabra Heck regarding urine micro results. She states she is unsure of what symptoms of kidney stone would be so unsure what to look for.  Reports recent back sugery so cant judge back pain. Discussed back pain commonly associated with kidney stone. Patient denies any new urinary symptoms. Has long term urinary incontinence and uses adult diapers/pads at night. Despite incontinence, has feeling of incomplete emptying during the day. All of this is on-going problem for "100 years."  Advised will provide update to Dr Sabra Heck and will call back if additional instructions.

## 2016-04-04 ENCOUNTER — Encounter: Payer: Self-pay | Admitting: Family Medicine

## 2016-04-04 ENCOUNTER — Ambulatory Visit (INDEPENDENT_AMBULATORY_CARE_PROVIDER_SITE_OTHER): Payer: Medicare Other | Admitting: Family Medicine

## 2016-04-04 VITALS — BP 136/82 | HR 80 | Temp 98.0°F | Ht 60.25 in | Wt 164.0 lb

## 2016-04-04 DIAGNOSIS — E785 Hyperlipidemia, unspecified: Secondary | ICD-10-CM | POA: Diagnosis not present

## 2016-04-04 DIAGNOSIS — I10 Essential (primary) hypertension: Secondary | ICD-10-CM | POA: Diagnosis not present

## 2016-04-04 DIAGNOSIS — K219 Gastro-esophageal reflux disease without esophagitis: Secondary | ICD-10-CM

## 2016-04-04 DIAGNOSIS — Z23 Encounter for immunization: Secondary | ICD-10-CM

## 2016-04-04 DIAGNOSIS — L989 Disorder of the skin and subcutaneous tissue, unspecified: Secondary | ICD-10-CM

## 2016-04-04 DIAGNOSIS — F329 Major depressive disorder, single episode, unspecified: Secondary | ICD-10-CM

## 2016-04-04 DIAGNOSIS — F32A Depression, unspecified: Secondary | ICD-10-CM

## 2016-04-04 NOTE — Assessment & Plan Note (Signed)
S: controlled on nadolol 40mg  BP Readings from Last 3 Encounters:  04/04/16 136/82  04/01/16 124/78  03/28/16 123/73  A/P:Continue current meds:  Doing well

## 2016-04-04 NOTE — Assessment & Plan Note (Signed)
S:prilosec 20mg  otc. Recurs if off A/P: continue PPI given recurrence off meds

## 2016-04-04 NOTE — Progress Notes (Signed)
Phone: [phone]  Subjective:  Patient presents today to establish care with me as their new primary care provider. Patient was formerly a patient of Dr. Sharlet Salina. Chief complaint-noted.   See problem oriented charting ROS- No chest pain or shortness of breath. No headache or blurry vision.   The following were reviewed and entered/updated in epic: Past Medical History:  Diagnosis Date  . Anxiety   . Colon polyps   . Complication of anesthesia    takes longer to wake up- admitted overnight after colonoscopy  . Depression   . GERD (gastroesophageal reflux disease)   . Glaucoma   . Headache    migraines  . Hx: UTI (urinary tract infection)   . Hypercholesteremia   . Hypertension   . Lichen simplex chronicus   . Macular degeneration   . Osteoarthritis   . Osteopenia    of the hip  . Stress incontinence, female   . Unsteady gait   . Urinary urgency   . Wears glasses   . Whooping cough    as a baby   Patient Active Problem List   Diagnosis Date Noted  . Lumbar stenosis with neurogenic claudication 03/20/2016    Priority: Medium  . Scalp pain 10/22/2015    Priority: Medium  . Macular degeneration of right eye 03/03/2013    Priority: Medium  . Glaucoma 03/03/2013    Priority: Medium  . Hyperlipidemia 03/14/2009    Priority: Medium  . Depression 10/19/2007    Priority: Medium  . Essential hypertension 10/19/2007    Priority: Medium  . GASTROESOPHAGEAL REFLUX DISEASE 10/19/2007    Priority: Medium  . Vaginal itching 04/04/2016    Priority: Low  . Post-traumatic headache 10/11/2015    Priority: Low  . Routine health maintenance 03/06/2013    Priority: Low  . Osteoarthritis 10/19/2007    Priority: Low  . STRESS INCONTINENCE 10/19/2007    Priority: Low  . Allergic state 10/19/2007    Priority: Low   Past Surgical History:  Procedure Laterality Date  . ABDOMINAL HYSTERECTOMY     ovaries remain  . APPENDECTOMY    . BLADDER REPAIR     x two  .  CHOLECYSTECTOMY    . COLONOSCOPY    . EYE SURGERY  11/14, 12/14   Macular Dengeneration, Glaucoma  . EYE SURGERY  2016   left eye  . LESION REMOVAL Right 06/28/2013   Procedure: EXCISION 3 cm right labial sebaceous cyst;  Surgeon: Lyman Speller, MD;  Location: [facility];  Service: Gynecology;  Laterality: Right;  . LUMBAR LAMINECTOMY/DECOMPRESSION MICRODISCECTOMY Right 03/20/2016   Procedure: Right - Lumbar four-five lumbar laminotomy, foraminotomy, and possible microdiscectomy;  Surgeon: Jovita Gamma, MD;  Location: [facility];  Service: Neurosurgery;  Laterality: Right;  right  . MOHS SURGERY     procedure to remove basal cell  . TONSILLECTOMY AND ADENOIDECTOMY    . URETHRAL SLING  2007  . URETHRAL SLING  1/12   midurethral   . vertebroplasty secondary to traumatic compression fracture      Family History  Problem Relation Age of Onset  . Congestive Heart Failure Mother   . Thyroid disease Mother   . Osteoporosis Mother   . Alcoholism Mother   . Prostate cancer Father   . Breast cancer Maternal Grandmother   . Hypertension Sister   . Thyroid disease Sister   . Cancer Sister     brain ?  Marland Kitchen Rheum arthritis Daughter     Medications- reviewed  and updated Current Outpatient Prescriptions  Medication Sig Dispense Refill  . acetaminophen (TYLENOL) 500 MG tablet Take 1,000 mg by mouth every 6 (six) hours as needed for headache.    Marland Kitchen aspirin 81 MG tablet Take 81 mg by mouth daily.     . clobetasol ointment (TEMOVATE) 0.05 % apply to affected area twice a day use N9O MORE THAN 2 WEEKS  0  . COMBIGAN 0.2-0.5 % ophthalmic solution Place 1 drop into both eyes daily.   0  . ezetimibe-simvastatin (VYTORIN) 10-20 MG per tablet Take 1 tablet by mouth daily. 90 tablet 2  . latanoprost (XALATAN) 0.005 % ophthalmic solution Place 1 drop into both eyes at bedtime.   0  . nadolol (CORGARD) 40 MG tablet take 1 tablet by mouth once daily at bedtime 30 tablet 1  . omeprazole (PRILOSEC) 10  MG capsule Take 10 mg by mouth daily.    . traZODone (DESYREL) 100 MG tablet Take 25 mg by mouth at bedtime. Reported on 10/22/2015    . venlafaxine (EFFEXOR-XR) 150 MG 24 hr capsule Take 150 mg by mouth 2 (two) times daily.       No current facility-administered medications for this visit.     Allergies-reviewed and updated Allergies  Allergen Reactions  . Codeine Nausea And Vomiting    Social History   Social History  . Marital status: Married    Spouse name: N/A  . Number of children: 2  . Years of education: [employer]   Occupational History  . Retired    Social History Main Topics  . Smoking status: Former Smoker    Quit date: 08/11/1965  . Smokeless tobacco: Never Used  . Alcohol use 0.6 oz/week    1 Standard drinks or equivalent per week     Comment: occ   . Drug use: No  . Sexual activity: Not Asked     Comment: TAH   Other Topics Concern  . None   Social History Narrative   HSG, attended [employer]   Married '65   1 son '67, 1 Daughter '69; 5 grandchildren. 1 grandson dealing with drugs after divorce of parents. Oldest.    Work: retired Pharmacist, hospital   Marriage in good health   Former smoker   Right-handed   Caffeine: 11/2 cups per day   Objective: BP 136/82 (BP Location: Left Arm, Patient Position: Sitting, Cuff Size: Normal)   Pulse 80   Temp 98 F (36.7 C) (Oral)   Ht 5' 0.25" (1.53 m)   Wt 164 lb (74.4 kg)   LMP 08/11/1974   SpO2 96%   BMI 31.76 kg/m  Gen: NAD, resting comfortably On posterior scalp has at least a 5 x 3 cm hardened black area CV: RRR no murmurs rubs or gallops Lungs: CTAB no crackles, wheeze, rhonchi Abdomen: soft/nontender/nondistended/normal bowel sounds. No rebound or guarding.  Ext: no edema Skin: warm, dry Neuro: grossly normal, moves all extremities, PERRLA  Assessment/Plan:  Scalp lesion S:Fell in march and hit back of her head- had large scab after injury and slowly improving but still rather large scale like  ara. Has headaches near that spot and has seen neurology without significant findings. Plan was MRI but not clear if that was ordered yet A/P: we will refer to dermatology to see if any options to dissolve or remove this lesion- wonder if it will have to be surgically removed. Wonder if HA's could be related to pressure on skin and may improve when this  is removed.    Hyperlipidemia S: well controlled on vytorin. No myalgias.  Lab Results  Component Value Date   CHOL 146 10/08/2015   HDL 44.90 10/08/2015   LDLCALC 72 10/04/2014   LDLDIRECT 78.0 10/08/2015   TRIG 201.0 (H) 10/08/2015   CHOLHDL 3 10/08/2015   A/P: continue current meds.    GASTROESOPHAGEAL REFLUX DISEASE S:prilosec 20mg  otc. Recurs if off A/P: continue PPI given recurrence off meds  Essential hypertension S: controlled on nadolol 40mg  BP Readings from Last 3 Encounters:  04/04/16 136/82  04/01/16 124/78  03/28/16 123/73  A/P:Continue current meds:  Doing well   Depression S: controlled on effexor 150mg  BID, trazodone 25mg  (takes 1/4th of 100mg  tablet) A/P: continue current meds. phq2 of 0.   At least yearly.   Orders Placed This Encounter  Procedures  . Flu vaccine HIGH DOSE PF  . Ambulatory referral to Dermatology    Referral Priority:   Routine    Referral Type:   Consultation    Referral Reason:   Specialty Services Required    Requested Specialty:   Dermatology    Number of Visits Requested:   1   Return precautions advised.   Garret Reddish, MD

## 2016-04-04 NOTE — Telephone Encounter (Signed)
I think she would know something is different about her back pain if she had a stone.  Nothing else needed at this time.

## 2016-04-04 NOTE — Telephone Encounter (Signed)
Call to patient.  No answer, no VM.

## 2016-04-04 NOTE — Telephone Encounter (Signed)
Patient called back. Advised of response from Dr Sabra Heck. Advised to call if any symptoms change or develops new back pain issues. Patient agreeable. Encounter closed.

## 2016-04-04 NOTE — Patient Instructions (Addendum)
We will call you within a week about your referral to dermatology. If you do not hear within 2 weeks, give [place] a call.   High dose flu shot

## 2016-04-04 NOTE — Progress Notes (Signed)
Pre visit review using our clinic review tool, if applicable. No additional management support is needed unless otherwise documented below in the visit note. 

## 2016-04-04 NOTE — Assessment & Plan Note (Signed)
S: controlled on effexor 150mg  BID, trazodone 25mg  (takes 1/4th of 100mg  tablet) A/P: continue current meds. phq2 of 0.

## 2016-04-04 NOTE — Assessment & Plan Note (Signed)
S: well controlled on vytorin. No myalgias.  Lab Results  Component Value Date   CHOL 146 10/08/2015   HDL 44.90 10/08/2015   LDLCALC 72 10/04/2014   LDLDIRECT 78.0 10/08/2015   TRIG 201.0 (H) 10/08/2015   CHOLHDL 3 10/08/2015   A/P: continue current meds.

## 2016-04-09 NOTE — Progress Notes (Signed)
Completed.

## 2016-04-11 ENCOUNTER — Ambulatory Visit
Admission: RE | Admit: 2016-04-11 | Discharge: 2016-04-11 | Disposition: A | Discharge status: Home / Self Care | Payer: Medicare Other | Source: Ambulatory Visit | Attending: Neurology | Admitting: Neurology

## 2016-04-11 DIAGNOSIS — R413 Other amnesia: Secondary | ICD-10-CM

## 2016-04-11 DIAGNOSIS — R519 Headache, unspecified: Secondary | ICD-10-CM

## 2016-04-11 DIAGNOSIS — R51 Headache: Principal | ICD-10-CM

## 2016-04-14 ENCOUNTER — Telehealth: Payer: Self-pay | Admitting: Neurology

## 2016-04-14 DIAGNOSIS — T148XXA Other injury of unspecified body region, initial encounter: Secondary | ICD-10-CM

## 2016-04-14 NOTE — Telephone Encounter (Signed)
I called the patient. The MRI shows mild to moderated SVD, nothing that should cause memory problems ore gait disorder. ? Cause of the non-healing skalp wound. I will make a referral to the wound center.    MRI brain 04/11/16:  IMPRESSION: No acute intracranial process.  Moderate white matter changes most compatible with chronic small vessel ischemic disease. Involutional changes.

## 2016-05-06 IMAGING — CT CT CERVICAL SPINE W/O CM
4 of 7 series · 12 of 33 positions shown, 14 images · non-contrast
Comparison: Head CT 10/12/2015

CLINICAL DATA: Fall striking back of head on concrete. No loss of
consciousness.

EXAM:
CT HEAD WITHOUT CONTRAST
CT CERVICAL SPINE WITHOUT CONTRAST
TECHNIQUE: Multidetector CT imaging of the head and cervical spine was
performed following the standard protocol without intravenous
contrast. Multiplanar CT image reconstructions of the cervical spine
were also generated.

[Series 5: c_spine 2.0 st · axial · 0.33mm/px · z∈[-238,-126]mm · 3 of 114 slices shown, 4 images]
[im 29/114  soft-tissue]
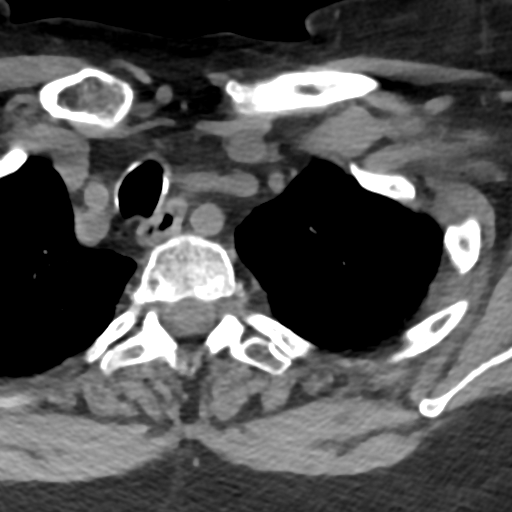
[im 29/114  bone]
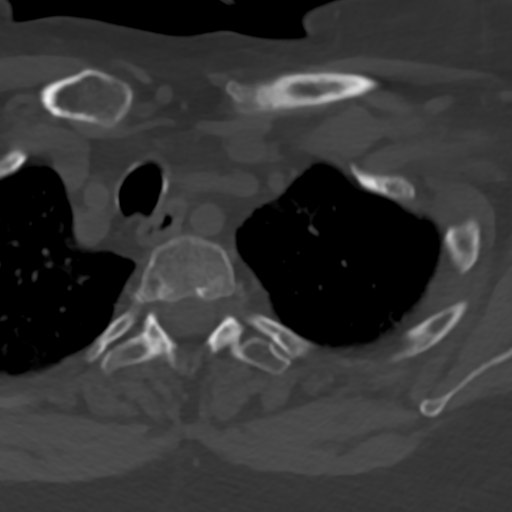
[im 57/114  bone]
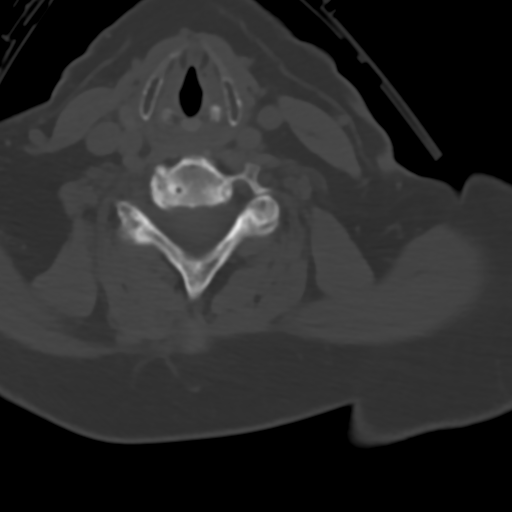
[im 85/114  bone]
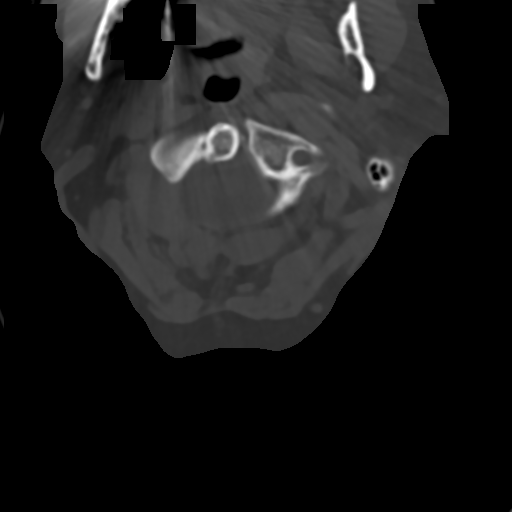

[Series 7: c_spine 2.0 sag bone · sagittal · 0.33mm/px · 5 of 61 slices shown, 6 images]
[im 21/61  bone]
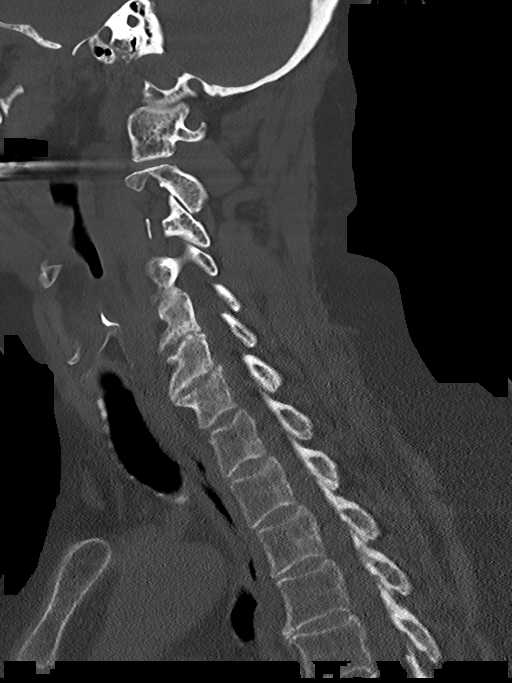
[im 26/61  bone]
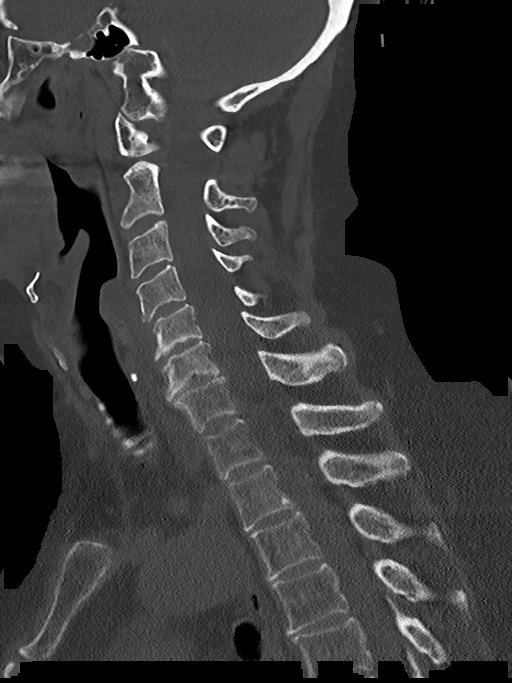
[im 31/61  soft-tissue]
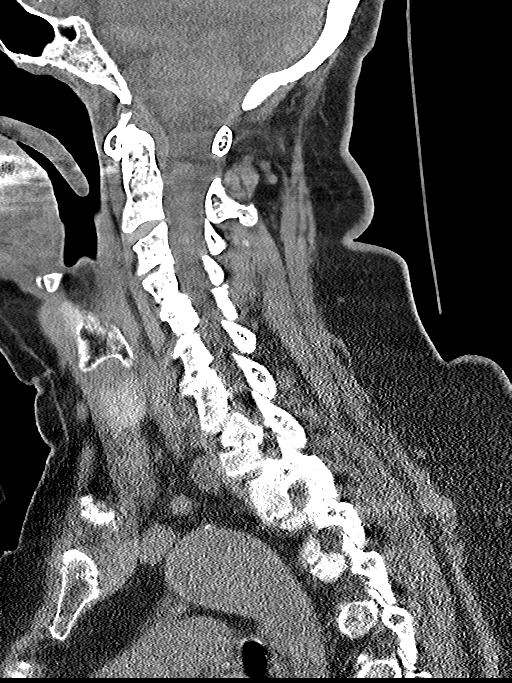
[im 31/61  bone]
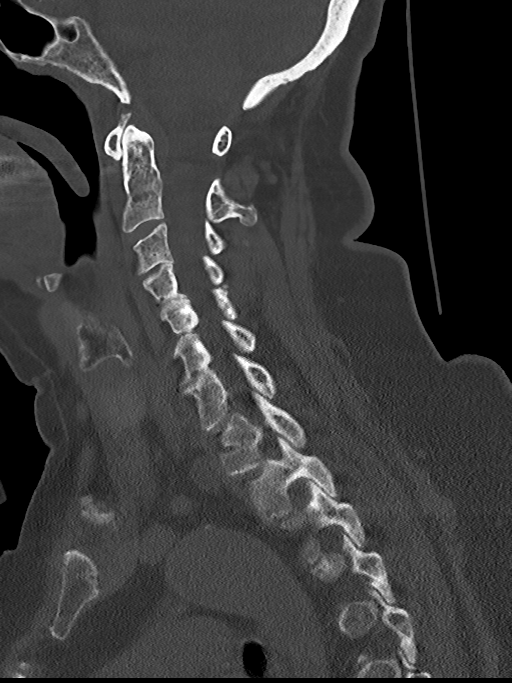
[im 36/61  bone]
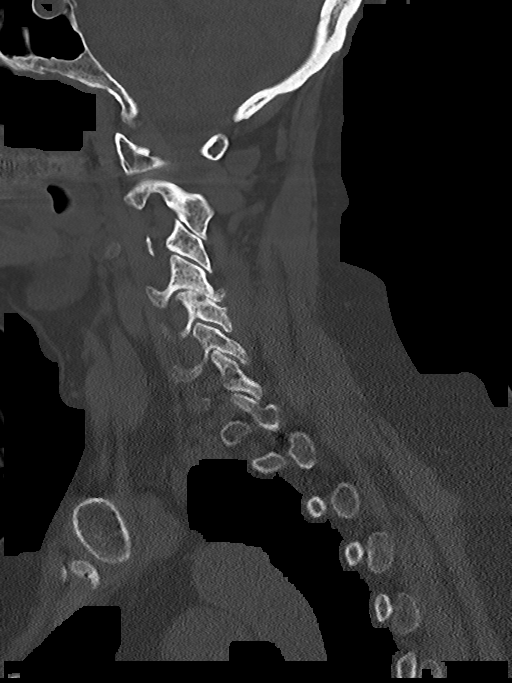
[im 41/61  bone]
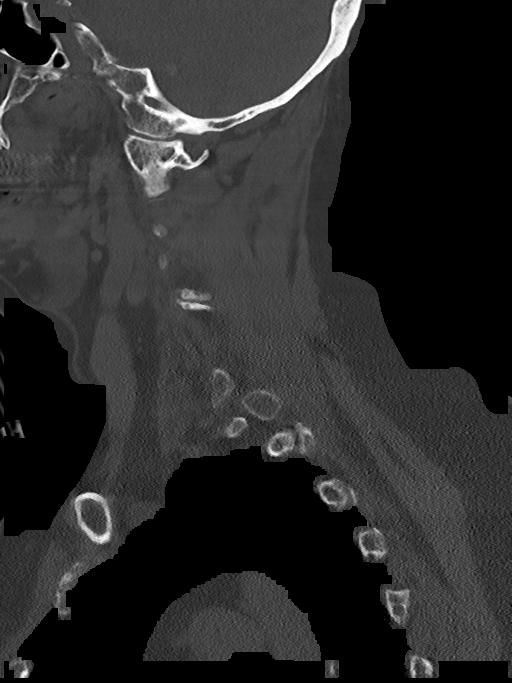

[Series 8: c_spine 2.0 cor bone · coronal · 0.33mm/px · 2 of 85 slices shown]
[im 29/85  bone]
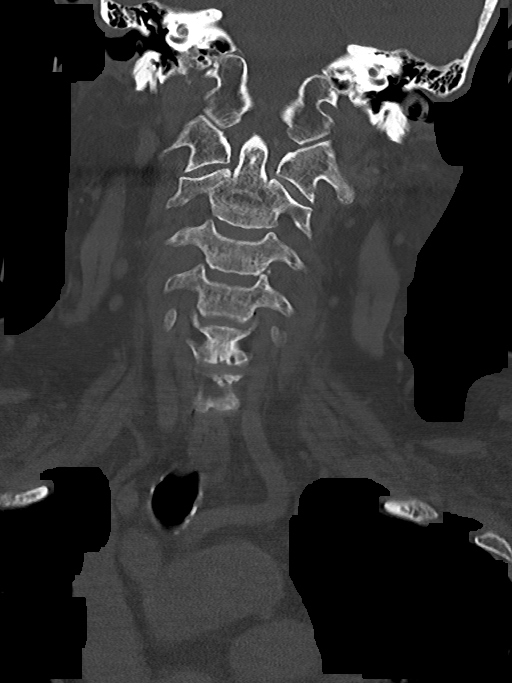
[im 57/85  bone]
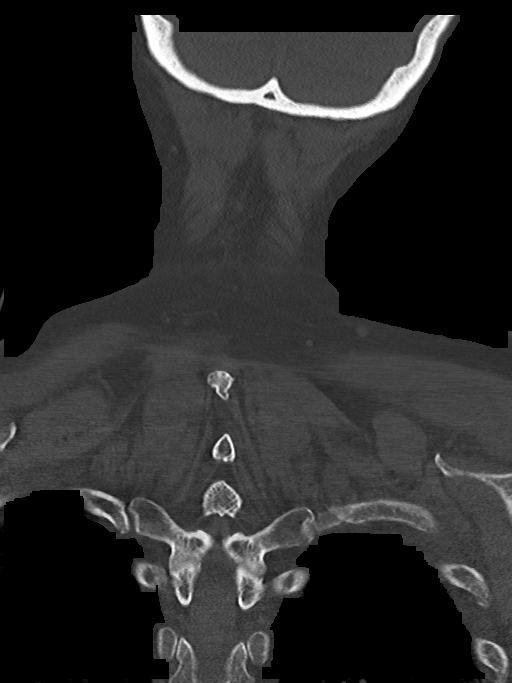

[Series 10: c_spine 2.0 orthogonals · axial · 0.21mm/px · z∈[-245,-182]mm · 2 of 102 slices shown]
[im 34/102  bone]
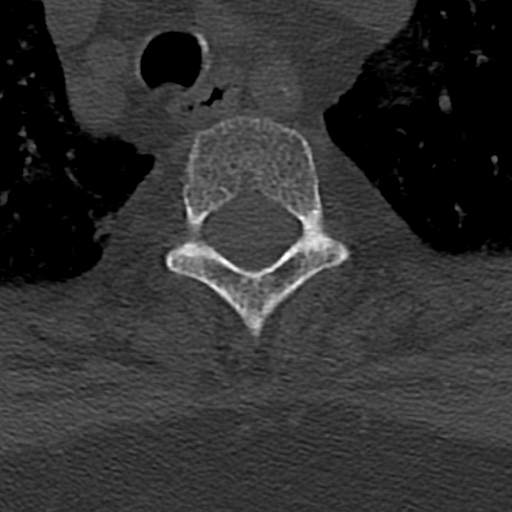
[im 68/102  bone]
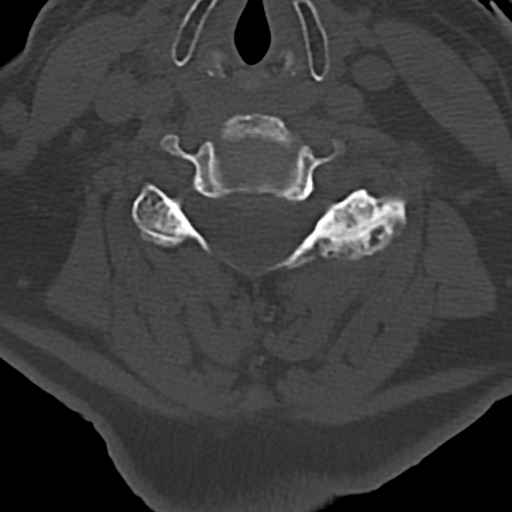

[12 of 33 positions shown; findings below may reference images not displayed]

FINDINGS: CT HEAD FINDINGS

No intracranial hemorrhage, mass effect, or midline shift. The
degree of atrophy and chronic small vessel ischemia is unchanged. No
hydrocephalus. The basilar cisterns are patent. No evidence of
territorial infarct. No intracranial fluid collection. Vertex scalp
hematoma, eccentric to the right. No subjacent fracture. Calvarium
is intact. Included paranasal sinuses and mastoid air cells are well
aerated.

CT CERVICAL SPINE FINDINGS

No acute fracture or subluxation. The dens is intact. There are no
jumped or perched facets. Degenerative disc disease with disc space
narrowing and endplate spurring from C4-C5 through C6-C7. Scattered
multilevel facet arthropathy. No prevertebral soft tissue edema.
IMPRESSION: 1. Vertex scalp hematoma without subjacent fracture or acute
intracranial abnormality.
2. Degenerative change in the cervical spine without acute fracture
or subluxation.

## 2016-05-07 ENCOUNTER — Encounter (HOSPITAL_BASED_OUTPATIENT_CLINIC_OR_DEPARTMENT_OTHER): Payer: Medicare Other | Attending: Surgery

## 2016-05-07 DIAGNOSIS — Z7982 Long term (current) use of aspirin: Secondary | ICD-10-CM | POA: Insufficient documentation

## 2016-05-07 DIAGNOSIS — I10 Essential (primary) hypertension: Secondary | ICD-10-CM | POA: Insufficient documentation

## 2016-05-07 DIAGNOSIS — S0101XA Laceration without foreign body of scalp, initial encounter: Secondary | ICD-10-CM | POA: Insufficient documentation

## 2016-05-07 DIAGNOSIS — Z79899 Other long term (current) drug therapy: Secondary | ICD-10-CM | POA: Insufficient documentation

## 2016-05-07 DIAGNOSIS — W19XXXA Unspecified fall, initial encounter: Secondary | ICD-10-CM | POA: Diagnosis not present

## 2016-05-07 DIAGNOSIS — M810 Age-related osteoporosis without current pathological fracture: Secondary | ICD-10-CM | POA: Insufficient documentation

## 2016-05-07 DIAGNOSIS — H409 Unspecified glaucoma: Secondary | ICD-10-CM | POA: Diagnosis not present

## 2016-05-08 ENCOUNTER — Other Ambulatory Visit: Payer: Self-pay | Admitting: Internal Medicine

## 2016-05-14 ENCOUNTER — Encounter (HOSPITAL_BASED_OUTPATIENT_CLINIC_OR_DEPARTMENT_OTHER): Payer: Medicare Other | Attending: Surgery

## 2016-05-14 ENCOUNTER — Telehealth: Payer: Self-pay | Admitting: Obstetrics & Gynecology

## 2016-05-14 DIAGNOSIS — W19XXXA Unspecified fall, initial encounter: Secondary | ICD-10-CM | POA: Insufficient documentation

## 2016-05-14 DIAGNOSIS — I10 Essential (primary) hypertension: Secondary | ICD-10-CM | POA: Insufficient documentation

## 2016-05-14 DIAGNOSIS — S0101XA Laceration without foreign body of scalp, initial encounter: Secondary | ICD-10-CM | POA: Insufficient documentation

## 2016-05-14 DIAGNOSIS — M810 Age-related osteoporosis without current pathological fracture: Secondary | ICD-10-CM | POA: Diagnosis not present

## 2016-05-14 NOTE — Telephone Encounter (Signed)
Spoke with patient. Patient states spoke with her Psychiatrist Dr. Caprice Beaver 05/13/16 and reports that she is taking a sabbatical. Patient states that Dr. Caprice Beaver requested patient to follow-up with Dr. Sabra Heck to see if she could refill her medications Trazodone and Effexor while  out. Advised patient Dr. Sabra Heck is out of the office today, could review with covering provider and return call. Patient states she would prefer for Dr. Sabra Heck to review as she has enough medication. Advised would return call after Dr. Sabra Heck returns on 05/15/16, with recommendations. Patient is agreeable.   Dr. Sabra Heck, could you refill Trazodone and Effexor?

## 2016-05-14 NOTE — Telephone Encounter (Signed)
patient spoke with he psychiatrist Dr Caprice Beaver yesterday and she is taking a sabbatical.  The patient and Dr Caprice Beaver want to know if Dr Sabra Heck will write her medications for her while dr Caprice Beaver is out of the office.  Dr Caprice Beaver [phone]

## 2016-05-15 ENCOUNTER — Other Ambulatory Visit: Payer: Self-pay | Admitting: Obstetrics & Gynecology

## 2016-05-15 NOTE — Telephone Encounter (Signed)
Left message to return call to [place] at [phone].

## 2016-05-15 NOTE — Telephone Encounter (Signed)
Yes I can RF these but three things I need clarified/updated.   1).  Please confirm she takes 25mg  Trazodone at night 2). Please confirm she takes Effexor XR 150mg  BID as this is unusual dosing. 3.)  She needs AEX moved up to late July or early August.   Thanks.

## 2016-05-15 NOTE — Telephone Encounter (Signed)
Is the Effexor XR or not?  I still need some clarification.  I'm sorry.

## 2016-05-15 NOTE — Telephone Encounter (Signed)
Spoke with patient. Patient states she takes 25mg  Trazodone daily at bedtime; patient reports she cuts 100mg  tabs into 1/4. Patient states the Effexor she takes BID- 150mg  in a.m; 150mg  in p.m. Patient AEX rescheduled for 8/16/1/ at 2:45pm. Advised would review with Dr. Sabra Heck and return call. Patient is agreeable.

## 2016-05-20 NOTE — Telephone Encounter (Signed)
Great.  Thanks for the update.  Ok to close encounter.  I have canceled the RFs for now.

## 2016-05-20 NOTE — Telephone Encounter (Signed)
Spoke with patient.  Patient confirms Effexor XR. Patient states she does not need refill immediatly. Patient states she would like to call when ready for refill. Advised I would let Dr. Sabra Heck know and call with any additional information. Patient is agreeable.

## 2016-05-21 DIAGNOSIS — S0101XA Laceration without foreign body of scalp, initial encounter: Secondary | ICD-10-CM | POA: Diagnosis not present

## 2016-06-04 DIAGNOSIS — S0101XA Laceration without foreign body of scalp, initial encounter: Secondary | ICD-10-CM | POA: Diagnosis not present

## 2016-06-10 ENCOUNTER — Telehealth: Payer: Self-pay

## 2016-06-10 DIAGNOSIS — S0101XA Laceration without foreign body of scalp, initial encounter: Secondary | ICD-10-CM | POA: Diagnosis not present

## 2016-06-10 NOTE — Telephone Encounter (Signed)
Spoke with patient who is asking if we can screen her for diabetes. She isn't experiencing any symptoms but was just wanting to be screened.   Please advise

## 2016-06-11 ENCOUNTER — Other Ambulatory Visit: Payer: Self-pay

## 2016-06-11 DIAGNOSIS — R739 Hyperglycemia, unspecified: Secondary | ICD-10-CM

## 2016-06-11 NOTE — Telephone Encounter (Signed)
Spoke with patient. Order entered and I scheduled patient for a lab appointment

## 2016-06-11 NOTE — Telephone Encounter (Signed)
Can order a1c under hyperglycemia. Per her last blood sugar she did have increased risk but did not have diabetes

## 2016-06-12 ENCOUNTER — Other Ambulatory Visit (INDEPENDENT_AMBULATORY_CARE_PROVIDER_SITE_OTHER): Payer: Medicare Other

## 2016-06-12 DIAGNOSIS — R739 Hyperglycemia, unspecified: Secondary | ICD-10-CM | POA: Diagnosis not present

## 2016-06-12 LAB — HEMOGLOBIN A1C: Hgb A1c MFr Bld: 5.3 % (ref 4.6–6.5)

## 2016-06-16 ENCOUNTER — Other Ambulatory Visit: Payer: Self-pay | Admitting: Family Medicine

## 2016-06-22 ENCOUNTER — Other Ambulatory Visit: Payer: Self-pay | Admitting: Internal Medicine

## 2016-07-06 ENCOUNTER — Other Ambulatory Visit: Payer: Self-pay | Admitting: Obstetrics & Gynecology

## 2016-07-07 NOTE — Telephone Encounter (Signed)
Medication refill request: clobetasol oint. Last AEX:  02-22-16  Next AEX: 03-26-17  Last MMG (if hormonal medication request): 12-13-15 WNL  Refill authorized: please advise

## 2016-09-25 ENCOUNTER — Other Ambulatory Visit: Payer: Self-pay | Admitting: Obstetrics & Gynecology

## 2016-09-25 MED ORDER — VENLAFAXINE HCL ER 150 MG PO CP24: 150.0000 mg | ORAL_CAPSULE | Freq: Two times a day (BID) | ORAL | 3 refills | Status: DC

## 2016-10-15 ENCOUNTER — Other Ambulatory Visit: Payer: Self-pay | Admitting: Obstetrics & Gynecology

## 2016-10-15 NOTE — Telephone Encounter (Signed)
Medication refill request: clobetasol  Last AEX:  02/22/16 SM  Next AEX: 03/26/17 SM  Last MMG (if hormonal medication request): 12/13/15 [email or web address]:Benign  Refill authorized: 07/07/16 #[id number]. Today please advise.

## 2016-12-15 ENCOUNTER — Telehealth: Payer: Self-pay | Admitting: Family Medicine

## 2016-12-15 NOTE — Telephone Encounter (Signed)
Tell her Id be happy to see her to evaluate if it is something we can do. Often cryotherapy or salicylic acid will work depending on location. If she would prefer just to see dermatology, you can also refer her for that

## 2016-12-15 NOTE — Telephone Encounter (Signed)
° ° ° °  Pt has finger nail warts and is asking for a referral to some one about the warts . Pt was not if it is a dermatologist she need to see.

## 2016-12-17 ENCOUNTER — Encounter: Payer: Self-pay | Admitting: Family Medicine

## 2016-12-17 ENCOUNTER — Ambulatory Visit (INDEPENDENT_AMBULATORY_CARE_PROVIDER_SITE_OTHER): Payer: Medicare Other | Admitting: Family Medicine

## 2016-12-17 VITALS — BP 136/78 | HR 63 | Temp 97.9°F | Ht 60.25 in | Wt 168.0 lb

## 2016-12-17 DIAGNOSIS — B078 Other viral warts: Secondary | ICD-10-CM | POA: Diagnosis not present

## 2016-12-17 LAB — HM MAMMOGRAPHY

## 2016-12-17 NOTE — Telephone Encounter (Signed)
Patient was seen in office today.  

## 2016-12-17 NOTE — Progress Notes (Signed)
Subjective:  [name] is a 81 y.o. year old very pleasant female patient who presents for/with See problem oriented charting ROS- no fever, chills. Recently diagnosed with UTI on antibiotic and dysuria improving. No nauea or vomiting. Denies new med around time of lesions on finger naisl    Past Medical History-  Patient Active Problem List   Diagnosis Date Noted  . Lumbar stenosis with neurogenic claudication 03/20/2016    Priority: Medium  . Scalp pain 10/22/2015    Priority: Medium  . Macular degeneration of right eye 03/03/2013    Priority: Medium  . Glaucoma 03/03/2013    Priority: Medium  . Hyperlipidemia 03/14/2009    Priority: Medium  . Depression 10/19/2007    Priority: Medium  . Essential hypertension 10/19/2007    Priority: Medium  . GASTROESOPHAGEAL REFLUX DISEASE 10/19/2007    Priority: Medium  . Vaginal itching 04/04/2016    Priority: Low  . Post-traumatic headache 10/11/2015    Priority: Low  . Routine health maintenance 03/06/2013    Priority: Low  . Osteoarthritis 10/19/2007    Priority: Low  . STRESS INCONTINENCE 10/19/2007    Priority: Low  . Allergic state 10/19/2007    Priority: Low    Medications- reviewed and updated Current Outpatient Prescriptions  Medication Sig Dispense Refill  . acetaminophen (TYLENOL) 500 MG tablet Take 1,000 mg by mouth every 6 (six) hours as needed for headache.    Marland Kitchen aspirin 81 MG tablet Take 81 mg by mouth daily.     . ciprofloxacin (CIPRO) 250 MG tablet Take 250 mg by mouth 2 (two) times daily.  0  . COMBIGAN 0.2-0.5 % ophthalmic solution Place 1 drop into both eyes daily.   0  . nadolol (CORGARD) 40 MG tablet take 1 tablet by mouth once daily at bedtime 30 tablet 5  . omeprazole (PRILOSEC) 10 MG capsule Take 10 mg by mouth daily.    . traZODone (DESYREL) 100 MG tablet Take 25 mg by mouth at bedtime. Reported on 10/22/2015    . trimethoprim (TRIMPEX) 100 MG tablet Take 100 mg by mouth daily.  1  . venlafaxine XR  (EFFEXOR-XR) 150 MG 24 hr capsule Take 1 capsule (150 mg total) by mouth 2 (two) times daily. 60 capsule 3  . VYTORIN 10-20 MG tablet take 1 tablet by mouth once daily 90 tablet 2   No current facility-administered medications for this visit.     Objective: BP 136/78 (BP Location: Left Arm, Patient Position: Sitting, Cuff Size: Normal)   Pulse 63   Temp 97.9 F (36.6 C) (Oral)   Ht 5' 0.25" (1.53 m)   Wt 168 lb (76.2 kg)   LMP 08/11/1974   SpO2 97%   BMI 32.54 kg/m  Gen: NAD, resting comfortably CV: RRR no murmurs rubs or gallops Lungs: CTAB no crackles, wheeze, rhonchi Abdomen: obese Ext: no edema On bilateral naisl in periungual area there are raised whitish areas with central black pits on all fingers.  Skin: warm, dry  Assessment/Plan:  Periungual wart S: Patient states she has had these whitish areas around fingernails with black discoloration in the middle for Several years. Not worsening. Raised areas with central black speckled appearanc - she will pick at them and some will disappear completely then others tend to grow. Other than scratching or picking- no other treatments tried. She was told these were "fingernail warts" A/P: I agree with potential of these being warts. Will trial salicylic acid treatment up to 12 weeks-  given handout today. If not improved by at least 6 weeks- she agrees to dermatology handout. I do not think so but on Differential also dishydrotic eczema   Meds ordered this encounter  Medications  . ciprofloxacin (CIPRO) 250 MG tablet    Sig: Take 250 mg by mouth 2 (two) times daily.    Refill:  0  . trimethoprim (TRIMPEX) 100 MG tablet    Sig: Take 100 mg by mouth daily.    Refill:  1    Return precautions advised.  Garret Reddish, MD

## 2016-12-17 NOTE — Patient Instructions (Signed)
See handout for wart treatment  Max trial of 12 weeks. If not making improvement in 6 weeks- lets stop and refer to dermatology.   Can cause some nail changes if gets into nail matrix.

## 2016-12-19 ENCOUNTER — Encounter: Payer: Self-pay | Admitting: Family Medicine

## 2016-12-30 ENCOUNTER — Other Ambulatory Visit: Payer: Self-pay | Admitting: Family Medicine

## 2017-01-22 ENCOUNTER — Encounter: Payer: Self-pay | Admitting: Family Medicine

## 2017-02-16 ENCOUNTER — Other Ambulatory Visit: Payer: Self-pay | Admitting: Obstetrics & Gynecology

## 2017-02-16 NOTE — Telephone Encounter (Signed)
Medication refill request: clobetasol ointment  Last AEX:  02/22/16 SM Next AEX: 03/26/17  Last MMG (if hormonal medication request): 12/17/16 [email or web address]:Benign  Refill authorized: please advise.

## 2017-02-19 ENCOUNTER — Encounter: Payer: Self-pay | Admitting: Family Medicine

## 2017-02-23 ENCOUNTER — Ambulatory Visit: Payer: Medicare Other | Admitting: Family Medicine

## 2017-03-06 ENCOUNTER — Ambulatory Visit (INDEPENDENT_AMBULATORY_CARE_PROVIDER_SITE_OTHER): Payer: Medicare Other | Admitting: Family Medicine

## 2017-03-06 ENCOUNTER — Encounter: Payer: Self-pay | Admitting: Family Medicine

## 2017-03-06 VITALS — BP 132/72 | HR 69 | Temp 97.8°F | Ht 60.25 in | Wt 169.2 lb

## 2017-03-06 DIAGNOSIS — R413 Other amnesia: Secondary | ICD-10-CM | POA: Diagnosis not present

## 2017-03-06 DIAGNOSIS — I10 Essential (primary) hypertension: Secondary | ICD-10-CM

## 2017-03-06 DIAGNOSIS — R600 Localized edema: Secondary | ICD-10-CM | POA: Diagnosis not present

## 2017-03-06 DIAGNOSIS — E785 Hyperlipidemia, unspecified: Secondary | ICD-10-CM

## 2017-03-06 DIAGNOSIS — F325 Major depressive disorder, single episode, in full remission: Secondary | ICD-10-CM

## 2017-03-06 LAB — COMPREHENSIVE METABOLIC PANEL
ALT: 11 U/L (ref 0–35)
AST: 16 U/L (ref 0–37)
Albumin: 4 g/dL (ref 3.5–5.2)
Alkaline Phosphatase: 55 U/L (ref 39–117)
BUN: 15 mg/dL (ref 6–23)
CALCIUM: 9.5 mg/dL (ref 8.4–10.5)
CHLORIDE: 105 meq/L (ref 96–112)
CO2: 30 meq/L (ref 19–32)
CREATININE: 0.72 mg/dL (ref 0.40–1.20)
GFR: 82.97 mL/min (ref >60.00)
Glucose, Bld: 77 mg/dL (ref 70–99)
POTASSIUM: 4.4 meq/L (ref 3.5–5.1)
Sodium: 141 mEq/L (ref 135–145)
Total Bilirubin: 0.4 mg/dL (ref 0.2–1.2)
Total Protein: 6.2 g/dL (ref 6.0–8.3)

## 2017-03-06 LAB — POC URINALSYSI DIPSTICK (AUTOMATED)
Bilirubin, UA: NEGATIVE
GLUCOSE UA: NEGATIVE
Ketones, UA: NEGATIVE
Leukocytes, UA: NEGATIVE
NITRITE UA: NEGATIVE
PH UA: 6 (ref 5.0–8.0)
Protein, UA: NEGATIVE
RBC UA: NEGATIVE
Spec Grav, UA: >=1.03 — AB (ref 1.010–1.025)
UROBILINOGEN UA: 0.2 U/dL

## 2017-03-06 LAB — LDL CHOLESTEROL, DIRECT: LDL DIRECT: 84 mg/dL

## 2017-03-06 LAB — CBC
HEMATOCRIT: 41.7 % (ref 36.0–46.0)
HEMOGLOBIN: 13.8 g/dL (ref 12.0–15.0)
MCHC: 33.2 g/dL (ref 30.0–36.0)
MCV: 96.6 fl (ref 78.0–100.0)
PLATELETS: 219 10*3/uL (ref 150.0–400.0)
RBC: 4.32 Mil/uL (ref 3.87–5.11)
RDW: 14 % (ref 11.5–15.5)
WBC: 6.4 10*3/uL (ref 4.0–10.5)

## 2017-03-06 LAB — VITAMIN B12: VITAMIN B 12: 507 pg/mL (ref 211–911)

## 2017-03-06 LAB — TSH: TSH: 4.56 u[IU]/mL — AB (ref 0.35–4.50)

## 2017-03-06 NOTE — Assessment & Plan Note (Signed)
Controlled on nadolol 40mg 

## 2017-03-06 NOTE — Addendum Note (Signed)
Addended by: Marin Olp on: 03/06/2017 02:24 PM   Modules accepted: Orders

## 2017-03-06 NOTE — Assessment & Plan Note (Signed)
Get b12, tsh, cbc, bmp. Short term memory loss. MMSE at [facility].  Declines syphilis, HIV testing.

## 2017-03-06 NOTE — Progress Notes (Signed)
Subjective:  Marlissa RUBENA ROSEMAN is a 81 y.o. year old very pleasant female patient who presents for/with See problem oriented charting ROS- see embedded ROS in subjective below. No chest pain or shortness of breath or DOE.    Past Medical History-  Patient Active Problem List   Diagnosis Date Noted  . Lumbar stenosis with neurogenic claudication 03/20/2016    Priority: Medium  . Scalp pain 10/22/2015    Priority: Medium  . Macular degeneration of right eye 03/03/2013    Priority: Medium  . Glaucoma 03/03/2013    Priority: Medium  . Hyperlipidemia 03/14/2009    Priority: Medium  . Depression 10/19/2007    Priority: Medium  . Essential hypertension 10/19/2007    Priority: Medium  . GASTROESOPHAGEAL REFLUX DISEASE 10/19/2007    Priority: Medium  . Vaginal itching 04/04/2016    Priority: Low  . Post-traumatic headache 10/11/2015    Priority: Low  . Routine health maintenance 03/06/2013    Priority: Low  . Osteoarthritis 10/19/2007    Priority: Low  . STRESS INCONTINENCE 10/19/2007    Priority: Low  . Allergic state 10/19/2007    Priority: Low  . Memory changes 03/06/2017    Medications- reviewed and updated Current Outpatient Prescriptions  Medication Sig Dispense Refill  . acetaminophen (TYLENOL) 500 MG tablet Take 1,000 mg by mouth every 6 (six) hours as needed for headache.    Marland Kitchen aspirin 81 MG tablet Take 81 mg by mouth daily.     . clobetasol ointment (TEMOVATE) 0.05 % apply to affected area externally twice a day FOR A MAXIMUM OF 2 WEEKS 30 g 0  . COMBIGAN 0.2-0.5 % ophthalmic solution Place 1 drop into both eyes daily.   0  . Mirabegron (MYRBETRIQ PO) Take 50 mg by mouth daily.    . nadolol (CORGARD) 40 MG tablet take 1 tablet by mouth at bedtime 90 tablet 1  . omeprazole (PRILOSEC) 10 MG capsule Take 10 mg by mouth daily.    . traZODone (DESYREL) 100 MG tablet Take 25 mg by mouth at bedtime. Reported on 10/22/2015    . trimethoprim (TRIMPEX) 100 MG tablet Take 100 mg  by mouth daily.  1  . Trospium Chloride 60 MG CP24 Take 1 capsule by mouth daily.    Marland Kitchen venlafaxine XR (EFFEXOR-XR) 150 MG 24 hr capsule Take 1 capsule (150 mg total) by mouth 2 (two) times daily. 60 capsule 3  . VYTORIN 10-20 MG tablet take 1 tablet by mouth once daily 90 tablet 2   No current facility-administered medications for this visit.     Objective: BP 132/72 (BP Location: Left Arm, Patient Position: Sitting, Cuff Size: Large)   Pulse 69   Temp 97.8 F (36.6 C) (Oral)   Ht 5' 0.25" (1.53 m)   Wt 169 lb 3.2 oz (76.7 kg)   LMP 08/11/1974   SpO2 95%   BMI 32.77 kg/m  Gen: NAD, resting comfortably CV: RRR no murmurs rubs or gallops Lungs: CTAB no crackles, wheeze, rhonchi Abdomen: soft/nontender/nondistended/normal bowel sounds. No rebound or guarding.  Ext: trace edema, 1-2+ DP and PT pulses bilaterally Skin: warm, dry, no rash on feet, slight bluish tint- with manipulation resolves  Assessment/Plan:  Edema S: has had blue feet when cold for sometime. Swelling in both legs for at least a few months. Gets better when sleeps then gets worse as the day progresses- always worst at night.  ROS- no shortness of breath, no orthopnea or PND. No calf or leg  pain.  A/P: doubt heart failure. Will et some baseline labs.   Hyperlipidemia S: well controlled on vytorin but > 1 year since labs on vytorin. No myalgias.  A/P: get LDL with labs  Depression S: PHQ2 of 0.  effexor 150mg  BID, trazodone 25 mg. A/P: continue current meds- do not think contributing to memory loss   Essential hypertension Controlled on nadolol 40mg   Memory changes Get b12, tsh, cbc, bmp. Short term memory loss. MMSE at [facility].  Declines syphilis, HIV testing.   Would need full neuro exam after AWV if any memory changes noted  Orders Placed This Encounter  Procedures  . CBC    [place]  . Comprehensive metabolic panel    [place]  . TSH    [place]  . Vitamin B12  . LDL cholesterol, direct    [place]   . POCT Urinalysis Dipstick (Automated)   Return precautions advised.  Garret Reddish, MD

## 2017-03-06 NOTE — Patient Instructions (Addendum)
If labs do not show cause of swelling, start ted compression hose during the day- failure of veins likely cause in that case  Please stop by lab before you go  Please keep visit for memory test on the 15th of august  If memory test is below 30/30 (needs full MMSE) then see me back after testing

## 2017-03-06 NOTE — Assessment & Plan Note (Signed)
S: well controlled on vytorin but > 1 year since labs on vytorin. No myalgias.  A/P: get LDL with labs

## 2017-03-06 NOTE — Assessment & Plan Note (Signed)
S: PHQ2 of 0.  effexor 150mg  BID, trazodone 25 mg. A/P: continue current meds- do not think contributing to memory loss

## 2017-03-09 ENCOUNTER — Telehealth: Payer: Self-pay | Admitting: Family Medicine

## 2017-03-09 NOTE — Telephone Encounter (Signed)
Called and spoke to patient and reviewed over lab results. She verbalized understanding

## 2017-03-09 NOTE — Telephone Encounter (Signed)
Pt would like someone to explain the results on mychart

## 2017-03-19 ENCOUNTER — Ambulatory Visit: Payer: Medicare Other

## 2017-03-25 ENCOUNTER — Ambulatory Visit (INDEPENDENT_AMBULATORY_CARE_PROVIDER_SITE_OTHER): Payer: Medicare Other

## 2017-03-25 ENCOUNTER — Other Ambulatory Visit: Payer: Self-pay | Admitting: Neurosurgery

## 2017-03-25 VITALS — BP 140/70 | HR 65 | Ht 60.0 in | Wt 169.0 lb

## 2017-03-25 DIAGNOSIS — S32010A Wedge compression fracture of first lumbar vertebra, initial encounter for closed fracture: Secondary | ICD-10-CM

## 2017-03-25 DIAGNOSIS — Z0000 Encounter for general adult medical examination without abnormal findings: Secondary | ICD-10-CM | POA: Diagnosis not present

## 2017-03-25 NOTE — Progress Notes (Addendum)
Subjective:   [name] is a 81 y.o. female who presents for Medicare Annual (Subsequent) preventive examination.  The Patient was informed that the wellness visit is to identify future health risk and educate and initiate measures that can reduce risk for increased disease through the lifespan.    Annual Wellness Assessment  TO CHECK MEMORY per Dr. Yong Channel  Reports health as fair Seeing dr. Durene Cal for back pain and dx scoliosis and may need surgery States he would like a dexa scan Had surgery on her back x 1 year ago  Had MRI this past week   Preventive Screening -Counseling & Management  Medicare Annual Preventive Care Visit - Subsequent Last OV 02/2017  Women's Preventive Health GET MMSE Colonoscopy 02/2003 Dr Earlean Shawl follows GI;   Repeat is 2019  For colonoscopy per the patient Mammogram 12/2016 Dexa 10/2013 - no need for repeat per the report note    [place] as poor, fair, good or great? fair  VS reviewed;   Diet  Eat 3 times a day Has a few vegetables and fruits Does not eat a lot of red meat  Cooks more in the winter    BMI 33   Exercise can't walk On hold due to back issues   Falls in April, foot got caught in the door jam  Head injury due to bleed; [facility] No skull fx but open wound; had to go to wound md  Education given    Dental- every thing is good, dental x 2 per year   Stressors: medical stressors  Sleep patterns: sleeps well   Pain some days it is 8 (1-10) Every night biofreeze on her back     Cardiac Risk Factors Addressed Hyperlipidemia chol / hdl ratio 3; chol 146; LDL 84; trig 201  Obesity overweight; coached regarding diet Plan developed  Advanced Directives  Yes; completed  Patient Care Team: Marin Olp, MD as PCP - General (Family Medicine) Assessed for additional providers  Immunization History  Administered Date(s) Administered  . Influenza Whole 07/18/2004, 04/25/2010, 04/28/2010  .  Influenza, High Dose Seasonal PF 08/26/2013, 04/19/2014, 04/04/2016  . Influenza-Unspecified 05/11/2012, 04/03/2015  . Pneumococcal Conjugate-13 10/08/2015  . Pneumococcal Polysaccharide-23 08/11/2006  . Td 03/09/2008  . Tdap 04/03/2015  . Zoster 08/11/2009  . Zoster Recombinat (Shingrix) 12/17/2016   Required Immunizations needed today  Screening test up to date or reviewed for plan of completion Health Maintenance Due  Topic Date Due  . INFLUENZA VACCINE  03/11/2017     Keep in mind the flu shot is an inactivated vaccine and takes at least 2 weeks to build immunity. The flu virus can be dormant for 4 days prior to symptoms Taking the flu shot at the beginning of the season can reduce the risk for the entire community.           Objective:     Vitals: BP 140/70   Pulse 65   Ht 5' (1.524 m)   Wt 169 lb (76.7 kg)   LMP 08/11/1974   SpO2 97%   BMI 33.01 kg/m   Body mass index is 33.01 kg/m.   Tobacco History  Smoking Status  . Former Smoker  . Quit date: 08/11/1965  Smokeless Tobacco  . Never Used    Comment: has not smoked in 45 years      Counseling given: Yes very limited smoking   Past Medical History:  Diagnosis Date  . Anxiety   . Colon  polyps   . Complication of anesthesia    takes longer to wake up- admitted overnight after colonoscopy  . Depression   . GERD (gastroesophageal reflux disease)   . Glaucoma   . Headache    migraines  . Hx: UTI (urinary tract infection)   . Hypercholesteremia   . Hypertension   . Lichen simplex chronicus   . Macular degeneration   . Osteoarthritis   . Osteopenia    of the hip  . Stress incontinence, female   . Unsteady gait   . Urinary urgency   . Wears glasses   . Whooping cough    as a baby   Past Surgical History:  Procedure Laterality Date  . ABDOMINAL HYSTERECTOMY     ovaries remain  . APPENDECTOMY    . BLADDER REPAIR     x two  . CHOLECYSTECTOMY    . COLONOSCOPY    . EYE SURGERY  11/14,  12/14   Macular Dengeneration, Glaucoma  . EYE SURGERY  2016   left eye  . LESION REMOVAL Right 06/28/2013   Procedure: EXCISION 3 cm right labial sebaceous cyst;  Surgeon: Lyman Speller, MD;  Location: [facility];  Service: Gynecology;  Laterality: Right;  . LUMBAR LAMINECTOMY/DECOMPRESSION MICRODISCECTOMY Right 03/20/2016   Procedure: Right - Lumbar four-five lumbar laminotomy, foraminotomy, and possible microdiscectomy;  Surgeon: Jovita Gamma, MD;  Location: [facility];  Service: Neurosurgery;  Laterality: Right;  right  . MOHS SURGERY     procedure to remove basal cell  . TONSILLECTOMY AND ADENOIDECTOMY    . URETHRAL SLING  2007  . URETHRAL SLING  1/12   midurethral   . vertebroplasty secondary to traumatic compression fracture     Family History  Problem Relation Age of Onset  . Congestive Heart Failure Mother   . Thyroid disease Mother   . Osteoporosis Mother   . Alcoholism Mother   . Prostate cancer Father   . Breast cancer Maternal Grandmother   . Hypertension Sister   . Thyroid disease Sister   . Cancer Sister        brain ?  Marland Kitchen Rheum arthritis Daughter    History  Sexual Activity  . Sexual activity: Not on file    Comment: TAH    Outpatient Encounter Prescriptions as of 03/25/2017  Medication Sig  . acetaminophen (TYLENOL) 500 MG tablet Take 1,000 mg by mouth every 6 (six) hours as needed for headache.  Marland Kitchen aspirin 81 MG tablet Take 81 mg by mouth daily.   . clobetasol ointment (TEMOVATE) 0.05 % apply to affected area externally twice a day FOR A MAXIMUM OF 2 WEEKS  . COMBIGAN 0.2-0.5 % ophthalmic solution Place 1 drop into both eyes daily.   . Mirabegron (MYRBETRIQ PO) Take 50 mg by mouth daily.  . nadolol (CORGARD) 40 MG tablet take 1 tablet by mouth at bedtime  . omeprazole (PRILOSEC) 10 MG capsule Take 10 mg by mouth daily.  . traZODone (DESYREL) 100 MG tablet Take 25 mg by mouth at bedtime. Reported on 10/22/2015  . trimethoprim (TRIMPEX) 100 MG tablet  Take 100 mg by mouth daily.  . Trospium Chloride 60 MG CP24 Take 1 capsule by mouth daily.  Marland Kitchen venlafaxine XR (EFFEXOR-XR) 150 MG 24 hr capsule Take 1 capsule (150 mg total) by mouth 2 (two) times daily.  Marland Kitchen VYTORIN 10-20 MG tablet take 1 tablet by mouth once daily   No facility-administered encounter medications on file as of 03/25/2017.  Activities of Daily Living In your present state of health, do you have any difficulty performing the following activities: 03/25/2017  Hearing? N  Vision? Y  Some recent data might be hidden    Patient Care Team: Marin Olp, MD as PCP - General (Family Medicine)    Assessment:     Exercise Activities and Dietary recommendations    Goals    . weight          Please get a good variety of colorful food   Watches her portions; eating less   Fat free or low fat dairy products Fish high in omega-3 acids ( salmon, tuna, trout) Fruits, such as apples, bananas, oranges, pears, prunes Legumes, such as kidney beans, lentils, checkpeas, black-eyed peas and lima beans Vegetables; broccoli, cabbage, carrots Whole grains;   Plant fats are better; decrease "white" foods as pasta, rice, bread and desserts, sugar; Avoid red meat (limiting) palm and coconut oils; sugary foods and beverages  Two nutrients that raise blood chol levels are saturated fats and trans fat; in hydrogenated oils and fats, as stick margarine, baked goods (cookes, cakes, pies, crackers; frosting; and coffee creamers;   Some Fats lower cholesterol: Monounsaturated and polyunsaturated  Avocados Corn, sunflower, and soybean oils Nuts and seeds, such as walnuts Olive, canola, peanut, safflower, and sesame oils Peanut butter Salmon and trout Tofu         Fall Risk Fall Risk  03/25/2017 03/06/2017 04/04/2016 10/08/2015  Falls in the past year? Yes Yes Yes Yes  Number falls in past yr: 2 or more 2 or more 2 or more 2 or more  Injury with Fall? - No Yes Yes  Follow up Education  provided - - -   Depression Screen PHQ 2/9 Scores 03/25/2017 03/06/2017 04/04/2016  PHQ - 2 Score 0 0 0     Cognitive Function MMSE - Mini Mental State Exam 03/25/2017  Orientation to time 5  Orientation to Place 5  Registration 3  Attention/ Calculation 5  Recall 3  Language- name 2 objects 2  Language- follow 3 step command 3  Language- read & follow direction 1  Write a sentence 1  Copy design 1        Immunization History  Administered Date(s) Administered  . Influenza Whole 07/18/2004, 04/25/2010, 04/28/2010  . Influenza, High Dose Seasonal PF 08/26/2013, 04/19/2014, 04/04/2016  . Influenza-Unspecified 05/11/2012, 04/03/2015  . Pneumococcal Conjugate-13 10/08/2015  . Pneumococcal Polysaccharide-23 08/11/2006  . Td 03/09/2008  . Tdap 04/03/2015  . Zoster 08/11/2009  . Zoster Recombinat (Shingrix) 12/17/2016   Screening Tests Health Maintenance  Topic Date Due  . INFLUENZA VACCINE  03/11/2017  . TETANUS/TDAP  04/02/2025  . DEXA SCAN  Completed  . PNA vac Low Risk Adult  Completed      Plan:     PCP Notes   Health Maintenance So she SE with shingrix with rash, swelling and fever and lasted for several days for up to week   Following Dr. Thana Farr for her colonoscopy. States she will need another one around 2019 not sure when but she will follow-up  Spouse questioned recall; perfect MMSE score No issues in the assessment today  Abnormal Screens    Referrals  No referrals  Patient concerns; Patient was not able to take her second shingrix due to her side effects of the first one. Developed a rash swelling and low-grade 10 and this was noted on the allergy screen.  Nurse Concerns; Dr. Yong Channel to advise if  dexa is needed; Dr. Durene Cal a dexa prior to his recommendation for surgery;   2015 is normal.   Next PCP apt The patient was just seen in July and will follow-up as needed.      I have personally reviewed and noted the following in the patient's  chart:   . Medical and social history . Use of alcohol, tobacco or illicit drugs  . Current medications and supplements . Functional ability and status . Nutritional status . Physical activity . Advanced directives . List of other physicians . Hospitalizations, surgeries, and ER visits in previous 12 months . Vitals . Screenings to include cognitive, depression, and falls . Referrals and appointments  In addition, I have reviewed and discussed with patient certain preventive protocols, quality metrics, and best practice recommendations. A written personalized care plan for preventive services as well as general preventive health recommendations were provided to patient.     LIDCV,UDTHY, RN  03/25/2017  Above notes reviewed in absence of her primary.  Agree with assessment as above.  Eulas Post MD [facility]

## 2017-03-25 NOTE — Patient Instructions (Addendum)
[name] , Thank you for taking time to come for your Medicare Wellness Visit. I appreciate your ongoing commitment to your health goals. Please review the following plan we discussed and let me know if I can assist you in the future.   You are following Dr. Earlean Shawl for you colonoscopy   Dr. Yong Channel to decide if you need another DEXA scan   Will not take shingrix no 2 due to Side effects    These are the goals we discussed: Goals    . weight          Please get a good variety of colorful food   Watches her portions; eating less   Fat free or low fat dairy products Fish high in omega-3 acids ( salmon, tuna, trout) Fruits, such as apples, bananas, oranges, pears, prunes Legumes, such as kidney beans, lentils, checkpeas, black-eyed peas and lima beans Vegetables; broccoli, cabbage, carrots Whole grains;   Plant fats are better; decrease "white" foods as pasta, rice, bread and desserts, sugar; Avoid red meat (limiting) palm and coconut oils; sugary foods and beverages  Two nutrients that raise blood chol levels are saturated fats and trans fat; in hydrogenated oils and fats, as stick margarine, baked goods (cookes, cakes, pies, crackers; frosting; and coffee creamers;   Some Fats lower cholesterol: Monounsaturated and polyunsaturated  Avocados Corn, sunflower, and soybean oils Nuts and seeds, such as walnuts Olive, canola, peanut, safflower, and sesame oils Peanut butter Salmon and trout Tofu          This is a list of the screening recommended for you and due dates:  Health Maintenance  Topic Date Due  . Flu Shot  03/11/2017  . Tetanus Vaccine  04/02/2025  . DEXA scan (bone density measurement)  Completed  . Pneumonia vaccines  Completed    Health Maintenance for Postmenopausal Women Menopause is a normal process in which your reproductive ability comes to an end. This process happens gradually over a span of months to years, usually between the ages of 47 and 65.  Menopause is complete when you have missed 12 consecutive menstrual periods. It is important to talk with your health care provider about some of the most common conditions that affect postmenopausal women, such as heart disease, cancer, and bone loss (osteoporosis). Adopting a healthy lifestyle and getting preventive care can help to promote your health and wellness. Those actions can also lower your chances of developing some of these common conditions. What should I know about menopause? During menopause, you may experience a number of symptoms, such as:  Moderate-to-severe hot flashes.  Night sweats.  Decrease in sex drive.  Mood swings.  Headaches.  Tiredness.  Irritability.  Memory problems.  Insomnia.  Choosing to treat or not to treat menopausal changes is an individual decision that you make with your health care provider. What should I know about hormone replacement therapy and supplements? Hormone therapy products are effective for treating symptoms that are associated with menopause, such as hot flashes and night sweats. Hormone replacement carries certain risks, especially as you become older. If you are thinking about using estrogen or estrogen with progestin treatments, discuss the benefits and risks with your health care provider. What should I know about heart disease and stroke? Heart disease, heart attack, and stroke become more likely as you age. This may be due, in part, to the hormonal changes that your body experiences during menopause. These can affect how your body processes dietary fats, triglycerides, and cholesterol.  Heart attack and stroke are both medical emergencies. There are many things that you can do to help prevent heart disease and stroke:  Have your blood pressure checked at least every 1-2 years. High blood pressure causes heart disease and increases the risk of stroke.  If you are 40-73 years old, ask your health care provider if you should take  aspirin to prevent a heart attack or a stroke.  Do not use any tobacco products, including cigarettes, chewing tobacco, or electronic cigarettes. If you need help quitting, ask your health care provider.  It is important to eat a healthy diet and maintain a healthy weight. ? Be sure to include plenty of vegetables, fruits, low-fat dairy products, and lean protein. ? Avoid eating foods that are high in solid fats, added sugars, or salt (sodium).  Get regular exercise. This is one of the most important things that you can do for your health. ? Try to exercise for at least 150 minutes each week. The type of exercise that you do should increase your heart rate and make you sweat. This is known as moderate-intensity exercise. ? Try to do strengthening exercises at least twice each week. Do these in addition to the moderate-intensity exercise.  Know your numbers.Ask your health care provider to check your cholesterol and your blood glucose. Continue to have your blood tested as directed by your health care provider.  What should I know about cancer screening? There are several types of cancer. Take the following steps to reduce your risk and to catch any cancer development as early as possible. Breast Cancer  Practice breast self-awareness. ? This means understanding how your breasts normally appear and feel. ? It also means doing regular breast self-exams. Let your health care provider know about any changes, no matter how small.  If you are 13 or older, have a clinician do a breast exam (clinical breast exam or CBE) every year. Depending on your age, family history, and medical history, it may be recommended that you also have a yearly breast X-ray (mammogram).  If you have a family history of breast cancer, talk with your health care provider about genetic screening.  If you are at high risk for breast cancer, talk with your health care provider about having an MRI and a mammogram every  year.  Breast cancer (BRCA) gene test is recommended for women who have family members with BRCA-related cancers. Results of the assessment will determine the need for genetic counseling and BRCA1 and for BRCA2 testing. BRCA-related cancers include these types: ? Breast. This occurs in males or females. ? Ovarian. ? Tubal. This may also be called fallopian tube cancer. ? Cancer of the abdominal or pelvic lining (peritoneal cancer). ? Prostate. ? Pancreatic.  Cervical, Uterine, and Ovarian Cancer Your health care provider may recommend that you be screened regularly for cancer of the pelvic organs. These include your ovaries, uterus, and vagina. This screening involves a pelvic exam, which includes checking for microscopic changes to the surface of your cervix (Pap test).  For women ages 21-65, health care providers may recommend a pelvic exam and a Pap test every three years. For women ages 33-65, they may recommend the Pap test and pelvic exam, combined with testing for human papilloma virus (HPV), every five years. Some types of HPV increase your risk of cervical cancer. Testing for HPV may also be done on women of any age who have unclear Pap test results.  Other health care providers may not  recommend any screening for nonpregnant women who are considered low risk for pelvic cancer and have no symptoms. Ask your health care provider if a screening pelvic exam is right for you.  If you have had past treatment for cervical cancer or a condition that could lead to cancer, you need Pap tests and screening for cancer for at least 20 years after your treatment. If Pap tests have been discontinued for you, your risk factors (such as having a new sexual partner) need to be reassessed to determine if you should start having screenings again. Some women have medical problems that increase the chance of getting cervical cancer. In these cases, your health care provider may recommend that you have screening  and Pap tests more often.  If you have a family history of uterine cancer or ovarian cancer, talk with your health care provider about genetic screening.  If you have vaginal bleeding after reaching menopause, tell your health care provider.  There are currently no reliable tests available to screen for ovarian cancer.  Lung Cancer Lung cancer screening is recommended for adults 40-40 years old who are at high risk for lung cancer because of a history of smoking. A yearly low-dose CT scan of the lungs is recommended if you:  Currently smoke.  Have a history of at least 30 pack-years of smoking and you currently smoke or have quit within the past 15 years. A pack-year is smoking an average of one pack of cigarettes per day for one year.  Yearly screening should:  Continue until it has been 15 years since you quit.  Stop if you develop a health problem that would prevent you from having lung cancer treatment.  Colorectal Cancer  This type of cancer can be detected and can often be prevented.  Routine colorectal cancer screening usually begins at age 43 and continues through age 56.  If you have risk factors for colon cancer, your health care provider may recommend that you be screened at an earlier age.  If you have a family history of colorectal cancer, talk with your health care provider about genetic screening.  Your health care provider may also recommend using home test kits to check for hidden blood in your stool.  A small camera at the end of a tube can be used to examine your colon directly (sigmoidoscopy or colonoscopy). This is done to check for the earliest forms of colorectal cancer.  Direct examination of the colon should be repeated every 5-10 years until age 70. However, if early forms of precancerous polyps or small growths are found or if you have a family history or genetic risk for colorectal cancer, you may need to be screened more often.  Skin Cancer  Check  your skin from head to toe regularly.  Monitor any moles. Be sure to tell your health care provider: ? About any new moles or changes in moles, especially if there is a change in a mole's shape or color. ? If you have a mole that is larger than the size of a pencil eraser.  If any of your family members has a history of skin cancer, especially at a young age, talk with your health care provider about genetic screening.  Always use sunscreen. Apply sunscreen liberally and repeatedly throughout the day.  Whenever you are outside, protect yourself by wearing long sleeves, pants, a wide-brimmed hat, and sunglasses.  What should I know about osteoporosis? Osteoporosis is a condition in which bone destruction happens more  quickly than new bone creation. After menopause, you may be at an increased risk for osteoporosis. To help prevent osteoporosis or the bone fractures that can happen because of osteoporosis, the following is recommended:  If you are 69-25 years old, get at least 1,000 mg of calcium and at least 600 mg of vitamin D per day.  If you are older than age 66 but younger than age 73, get at least 1,200 mg of calcium and at least 600 mg of vitamin D per day.  If you are older than age 34, get at least 1,200 mg of calcium and at least 800 mg of vitamin D per day.  Smoking and excessive alcohol intake increase the risk of osteoporosis. Eat foods that are rich in calcium and vitamin D, and do weight-bearing exercises several times each week as directed by your health care provider. What should I know about how menopause affects my mental health? Depression may occur at any age, but it is more common as you become older. Common symptoms of depression include:  Low or sad mood.  Changes in sleep patterns.  Changes in appetite or eating patterns.  Feeling an overall lack of motivation or enjoyment of activities that you previously enjoyed.  Frequent crying spells.  Talk with your  health care provider if you think that you are experiencing depression. What should I know about immunizations? It is important that you get and maintain your immunizations. These include:  Tetanus, diphtheria, and pertussis (Tdap) booster vaccine.  Influenza every year before the flu season begins.  Pneumonia vaccine.  Shingles vaccine.  Your health care provider may also recommend other immunizations. This information is not intended to replace advice given to you by your health care provider. Make sure you discuss any questions you have with your health care provider. Document Released: 09/19/2005 Document Revised: 02/15/2016 Document Reviewed: 05/01/2015 Elsevier Interactive Patient Education  2018 [place] in the Home Falls can cause injuries and can affect people from all age groups. There are many simple things that you can do to make your home safe and to help prevent falls. What can I do on the outside of my home?  Regularly repair the edges of walkways and driveways and fix any cracks.  Remove high doorway thresholds.  Trim any shrubbery on the main path into your home.  Use bright outdoor lighting.  Clear walkways of debris and clutter, including tools and rocks.  Regularly check that handrails are securely fastened and in good repair. Both sides of any steps should have handrails.  Install guardrails along the edges of any raised decks or porches.  Have leaves, snow, and ice cleared regularly.  Use sand or salt on walkways during winter months.  In the garage, clean up any spills right away, including grease or oil spills. What can I do in the bathroom?  Use night lights.  Install grab bars by the toilet and in the tub and shower. Do not use towel bars as grab bars.  Use non-skid mats or decals on the floor of the tub or shower.  If you need to sit down while you are in the shower, use a plastic, non-slip stool.  Keep the floor  dry. Immediately clean up any water that spills on the floor.  Remove soap buildup in the tub or shower on a regular basis.  Attach bath mats securely with double-sided non-slip rug tape.  Remove throw rugs and other tripping hazards from  the floor. What can I do in the bedroom?  Use night lights.  Make sure that a bedside light is easy to reach.  Do not use oversized bedding that drapes onto the floor.  Have a firm chair that has side arms to use for getting dressed.  Remove throw rugs and other tripping hazards from the floor. What can I do in the kitchen?  Clean up any spills right away.  Avoid walking on wet floors.  Place frequently used items in easy-to-reach places.  If you need to reach for something above you, use a sturdy step stool that has a grab bar.  Keep electrical cables out of the way.  Do not use floor polish or wax that makes floors slippery. If you have to use wax, make sure that it is non-skid floor wax.  Remove throw rugs and other tripping hazards from the floor. What can I do in the stairways?  Do not leave any items on the stairs.  Make sure that there are handrails on both sides of the stairs. Fix handrails that are broken or loose. Make sure that handrails are as long as the stairways.  Check any carpeting to make sure that it is firmly attached to the stairs. Fix any carpet that is loose or worn.  Avoid having throw rugs at the top or bottom of stairways, or secure the rugs with carpet tape to prevent them from moving.  Make sure that you have a light switch at the top of the stairs and the bottom of the stairs. If you do not have them, have them installed. What are some other fall prevention tips?  Wear closed-toe shoes that fit well and support your feet. Wear shoes that have rubber soles or low heels.  When you use a stepladder, make sure that it is completely opened and that the sides are firmly locked. Have someone hold the ladder while  you are using it. Do not climb a closed stepladder.  Add color or contrast paint or tape to grab bars and handrails in your home. Place contrasting color strips on the first and last steps.  Use mobility aids as needed, such as canes, walkers, scooters, and crutches.  Turn on lights if it is dark. Replace any light bulbs that burn out.  Set up furniture so that there are clear paths. Keep the furniture in the same spot.  Fix any uneven floor surfaces.  Choose a carpet design that does not hide the edge of steps of a stairway.  Be aware of any and all pets.  Review your medicines with your healthcare provider. Some medicines can cause dizziness or changes in blood pressure, which increase your risk of falling. Talk with your health care provider about other ways that you can decrease your risk of falls. This may include working with a physical therapist or trainer to improve your strength, balance, and endurance. This information is not intended to replace advice given to you by your health care provider. Make sure you discuss any questions you have with your health care provider. Document Released: 07/18/2002 Document Revised: 12/25/2015 Document Reviewed: 09/01/2014 Elsevier Interactive Patient Education  2017 [employer].

## 2017-03-26 ENCOUNTER — Ambulatory Visit (INDEPENDENT_AMBULATORY_CARE_PROVIDER_SITE_OTHER): Payer: Medicare Other | Admitting: Obstetrics & Gynecology

## 2017-03-26 ENCOUNTER — Encounter: Payer: Self-pay | Admitting: Obstetrics & Gynecology

## 2017-03-26 VITALS — BP 136/70 | HR 80 | Resp 14 | Ht 60.75 in | Wt 167.4 lb

## 2017-03-26 DIAGNOSIS — Z01419 Encounter for gynecological examination (general) (routine) without abnormal findings: Secondary | ICD-10-CM

## 2017-03-26 DIAGNOSIS — N3090 Cystitis, unspecified without hematuria: Secondary | ICD-10-CM

## 2017-03-26 MED ORDER — SULFAMETHOXAZOLE-TRIMETHOPRIM 800-160 MG PO TABS: 1.0000 | ORAL_TABLET | Freq: Two times a day (BID) | ORAL | 0 refills | Status: DC

## 2017-03-26 MED ORDER — CLOBETASOL PROPIONATE 0.05 % EX OINT: TOPICAL_OINTMENT | Freq: Two times a day (BID) | CUTANEOUS | 1 refills | Status: DC

## 2017-03-26 NOTE — Progress Notes (Signed)
81 y.o. G4P2 MarriedCaucasianF here for annual exam.  Doing well.  Denies vaginal bleeding.  Thinks she may have a UTI.  Has chronic urinary leakage.  Still seeing Dr. Matilde Sprang for urinary incontinence.  She's been on several different medications that really haven't helped.  Seeing Dr. Rita Ohara tomorrow.  Having a bone density tomorrow.  Having increased dryness with her eyes.  PCP:  Dr. Garret Reddish.  Just had blood work done in late July.  Patient's last menstrual period was 08/11/1974.          Sexually active: No.  The current method of family planning is status post hysterectomy.    Exercising: No.  The patient does not participate in regular exercise at present. Smoker:  Former smoker  Health Maintenance: Pap:  2005 normal  History of abnormal Pap:  no MMG:  12/17/16 BIRADS 2 benign  Colonoscopy:  2014- repeat 5 years  BMD:   10/14/13 normal- appointment scheduled for tomorrow  TDaP:  04/03/15  Pneumonia vaccine(s):  08/11/06, 10/08/15  Zostavax:   08/11/09, 12/17/16  Hep C testing: not indicated Screening Labs: PCP, Hb today: PCP, Urine today: unable to void at this time   reports that she quit smoking about 51 years ago. She has never used smokeless tobacco. She reports that she drinks about 0.6 oz of alcohol per week . She reports that she does not use drugs.  Past Medical History:  Diagnosis Date  . Anxiety   . Colon polyps   . Complication of anesthesia    takes longer to wake up- admitted overnight after colonoscopy  . Depression   . GERD (gastroesophageal reflux disease)   . Glaucoma   . Headache    migraines  . Hx: UTI (urinary tract infection)   . Hypercholesteremia   . Hypertension   . Lichen simplex chronicus   . Macular degeneration   . Osteoarthritis   . Osteopenia    of the hip  . Stress incontinence, female   . Unsteady gait   . Urinary urgency   . Wears glasses   . Whooping cough    as a baby    Past Surgical History:  Procedure Laterality Date   . ABDOMINAL HYSTERECTOMY     ovaries remain  . APPENDECTOMY    . BLADDER REPAIR     x two  . CHOLECYSTECTOMY    . COLONOSCOPY    . EYE SURGERY  11/14, 12/14   Macular Dengeneration, Glaucoma  . EYE SURGERY  2016   left eye  . LESION REMOVAL Right 06/28/2013   Procedure: EXCISION 3 cm right labial sebaceous cyst;  Surgeon: Lyman Speller, MD;  Location: [facility];  Service: Gynecology;  Laterality: Right;  . LUMBAR LAMINECTOMY/DECOMPRESSION MICRODISCECTOMY Right 03/20/2016   Procedure: Right - Lumbar four-five lumbar laminotomy, foraminotomy, and possible microdiscectomy;  Surgeon: Jovita Gamma, MD;  Location: [facility];  Service: Neurosurgery;  Laterality: Right;  right  . MOHS SURGERY     procedure to remove basal cell  . TONSILLECTOMY AND ADENOIDECTOMY    . URETHRAL SLING  2007  . URETHRAL SLING  1/12   midurethral   . vertebroplasty secondary to traumatic compression fracture      Current Outpatient Prescriptions  Medication Sig Dispense Refill  . acetaminophen (TYLENOL) 500 MG tablet Take 1,000 mg by mouth every 6 (six) hours as needed for headache.    Marland Kitchen aspirin 81 MG tablet Take 81 mg by mouth daily.     Marland Kitchen  clobetasol ointment (TEMOVATE) 0.05 % apply to affected area externally twice a day FOR A MAXIMUM OF 2 WEEKS 30 g 0  . COMBIGAN 0.2-0.5 % ophthalmic solution Place 1 drop into both eyes daily.   0  . Mirabegron (MYRBETRIQ PO) Take 50 mg by mouth daily.    . nadolol (CORGARD) 40 MG tablet take 1 tablet by mouth at bedtime 90 tablet 1  . omeprazole (PRILOSEC) 10 MG capsule Take 10 mg by mouth daily.    . traZODone (DESYREL) 100 MG tablet Take 25 mg by mouth at bedtime. Reported on 10/22/2015    . trimethoprim (TRIMPEX) 100 MG tablet Take 100 mg by mouth daily.  1  . Trospium Chloride 60 MG CP24 Take 1 capsule by mouth daily.    Marland Kitchen venlafaxine XR (EFFEXOR-XR) 150 MG 24 hr capsule Take 1 capsule (150 mg total) by mouth 2 (two) times daily. 60 capsule 3  . VYTORIN 10-20  MG tablet take 1 tablet by mouth once daily 90 tablet 2   No current facility-administered medications for this visit.     Family History  Problem Relation Age of Onset  . Congestive Heart Failure Mother   . Thyroid disease Mother   . Osteoporosis Mother   . Alcoholism Mother   . Prostate cancer Father   . Breast cancer Maternal Grandmother   . Hypertension Sister   . Thyroid disease Sister   . Cancer Sister        brain ?  Marland Kitchen Rheum arthritis Daughter     ROS:  Pertinent items are noted in HPI.  Otherwise, a comprehensive ROS was negative.  Exam:   BP 136/70 (BP Location: Left Arm, Patient Position: Sitting, Cuff Size: Normal)   Pulse 80   Resp 14   Ht 5' 0.75" (1.543 m)   Wt 167 lb 6.4 oz (75.9 kg)   LMP 08/11/1974   BMI 31.89 kg/m   Weight change: +2#  Height: 5' 0.75" (154.3 cm)  Ht Readings from Last 3 Encounters:  03/26/17 5' 0.75" (1.543 m)  03/25/17 5' (1.524 m)  03/06/17 5' 0.25" (1.53 m)    General appearance: alert, cooperative and appears stated age Head: Normocephalic, without obvious abnormality, atraumatic Neck: no adenopathy, supple, symmetrical, trachea midline and thyroid normal to inspection and palpation Lungs: clear to auscultation bilaterally Breasts: normal appearance, no masses or tenderness Heart: regular rate and rhythm Abdomen: soft, non-tender; bowel sounds normal; no masses,  no organomegaly Extremities: extremities normal, atraumatic, no cyanosis or edema Skin: Skin color, texture, turgor normal. No rashes or lesions Lymph nodes: Cervical, supraclavicular, and axillary nodes normal. No abnormal inguinal nodes palpated Neurologic: Grossly normal   Pelvic: External genitalia:  no lesions              Urethra:  normal appearing urethra with no masses, tenderness or lesions              Bartholins and Skenes: normal                 Vagina: normal appearing vagina with normal color and discharge, no lesions              Cervix: absent               Pap taken: No. Bimanual Exam:  Uterus:  uterus absent              Adnexa: no mass, fullness, tenderness  Rectovaginal: Confirms               Anus:  normal sphincter tone, no lesions  Chaperone was present for exam.  A:  Well Woman with normal exam PMP, no HRT Urinary incontinence with h/o recurrent UTI Hypertension  P:   Mammogram yearly pap smear not indicated Clobetasol 0.05% ointment use bid for no more than 7 days.  #30/1RF Urine culture pending.  Bactrim DS BID x 5 days sent to pharmacy. return annually or prn

## 2017-03-27 ENCOUNTER — Ambulatory Visit
Admission: RE | Admit: 2017-03-27 | Discharge: 2017-03-27 | Disposition: A | Discharge status: Home / Self Care | Payer: Medicare Other | Source: Ambulatory Visit | Attending: Neurosurgery | Admitting: Neurosurgery

## 2017-03-27 DIAGNOSIS — S32010A Wedge compression fracture of first lumbar vertebra, initial encounter for closed fracture: Secondary | ICD-10-CM

## 2017-03-29 LAB — URINE CULTURE

## 2017-03-30 ENCOUNTER — Telehealth: Payer: Self-pay | Admitting: *Deleted

## 2017-03-30 NOTE — Telephone Encounter (Signed)
Message left to return call to Leaann Nevils at [phone].    

## 2017-03-30 NOTE — Telephone Encounter (Signed)
Spoke with patient and gave results. Patient is feeling much better. -eh

## 2017-04-05 ENCOUNTER — Other Ambulatory Visit: Payer: Self-pay | Admitting: Internal Medicine

## 2017-04-06 ENCOUNTER — Other Ambulatory Visit: Payer: Self-pay | Admitting: *Deleted

## 2017-04-06 ENCOUNTER — Telehealth: Payer: Self-pay | Admitting: Obstetrics & Gynecology

## 2017-04-06 NOTE — Telephone Encounter (Signed)
Faxed refill request received from [facility] for VENLAFAXINE Last filled by MD on 09/25/16, #60 X 3 RF (1 BID) Last AEX - 03/26/17 Next AEX - 05/14/18 Last MMG - N/A  Please advise refills. Thank you.

## 2017-04-06 NOTE — Telephone Encounter (Signed)
Spoke with pharmacist at [facility], [place]. Patient requesting refill for Effexor-XR 150 mg bid.   Last AEX: 03/26/17 Next AEX: 05/14/18 Last MMG: 12/17/16  Refill authorized: Dr. Sabra Heck, please advise on refill? Last filled 09/25/16 #60/3RF.

## 2017-04-06 NOTE — Telephone Encounter (Signed)
Can you please call her pharmacy.  Until last year, I'd never written this for her and this dosage is high.  I'd like to know if she was every on the Effexor 150mg  BID that is NOT the XR dosage?  Thanks.

## 2017-04-06 NOTE — Telephone Encounter (Signed)
Patient says her pharmacy need approval for her effexor prescription. [facility] at [facility] [facility] center.

## 2017-04-06 NOTE — Telephone Encounter (Signed)
Spoke with pharmacist at [facility] [place]. Was advised patient has been taking Effexor XR 150 mg bid since 2013. Was advised patient called pharmacy since last conversation with pharmacist and advised medication refill was to go through Dr. Yong Channel. Refill request has not been sent by pharmacist yet, will send for refill request. Advised pharmacist would update Dr. Sabra Heck.   Routing to Dr. Sabra Heck.

## 2017-04-07 NOTE — Telephone Encounter (Signed)
Spoke with patient, advised as seen below per Dr. Sabra Heck. Patient thankful for assistance and appreciative for f/u.  Patient is agreeable to disposition. Will close encounter.

## 2017-04-07 NOTE — Telephone Encounter (Signed)
Can you please let her know this refill has been sent to Dr. Yong Channel who typically writes this for her.  Ok to close encounter.

## 2017-04-09 ENCOUNTER — Telehealth: Payer: Self-pay | Admitting: Family Medicine

## 2017-04-09 NOTE — Telephone Encounter (Signed)
Pt is calling to see if Dr. Yong Channel will refill venlafaxine  (EFFEXOR - XR) 150 MG  BID    Pharm:  [facility] [address]   Pt would like to have a call back if this can or can not be refilled.

## 2017-04-10 NOTE — Telephone Encounter (Signed)
Yes thanks may fill- was stable on last visit

## 2017-04-14 ENCOUNTER — Other Ambulatory Visit: Payer: Self-pay

## 2017-04-14 MED ORDER — VENLAFAXINE HCL ER 150 MG PO CP24: 150.0000 mg | ORAL_CAPSULE | Freq: Two times a day (BID) | ORAL | 3 refills | Status: DC

## 2017-04-14 NOTE — Telephone Encounter (Signed)
[name] spoke to patient and let her know medication had been sent to the pharmacy

## 2017-04-14 NOTE — Telephone Encounter (Signed)
Medication sent in to the pharmacy as requested. I called patient and left a voicemail asking for a return phone call

## 2017-04-15 ENCOUNTER — Other Ambulatory Visit: Payer: Self-pay | Admitting: Neurosurgery

## 2017-05-07 ENCOUNTER — Telehealth: Payer: Self-pay | Admitting: Family Medicine

## 2017-05-07 NOTE — Telephone Encounter (Signed)
Pt needs new rx trazodone 100 mg last refilled by dr crawford/ [facility] [address]

## 2017-05-08 NOTE — Telephone Encounter (Signed)
[facility] called to clarify the fax number for [facility].

## 2017-05-09 ENCOUNTER — Other Ambulatory Visit: Payer: Self-pay | Admitting: Family Medicine

## 2017-05-11 ENCOUNTER — Telehealth: Payer: Self-pay | Admitting: Family Medicine

## 2017-05-11 NOTE — Telephone Encounter (Signed)
Called and spoke to pharmacy. They are filling the prescription. I called the patient to let her know.

## 2017-05-11 NOTE — Telephone Encounter (Signed)
Patient's husband states that the medication traZODone (DESYREL) 100 MG tablet will not be filled at the pharmacy due to an appointment being needed, patient's husband is requesting a call to advise on why the appointment is being asked for if the patient's last appointment was 03/06/17. Call home phone to advise and ask for [facility]

## 2017-05-11 NOTE — Telephone Encounter (Signed)
Pharmacy called in reference to needing approval to refill traZODone (DESYREL) 100 MG tablet for patient. Please call pharmacy and advise.

## 2017-05-11 NOTE — Telephone Encounter (Signed)
Prescription requested electronically

## 2017-05-22 ENCOUNTER — Ambulatory Visit: Payer: Medicare Other | Admitting: Obstetrics & Gynecology

## 2017-06-08 ENCOUNTER — Inpatient Hospital Stay: Admit: 2017-06-08 | Payer: Medicare Other | Admitting: Neurosurgery

## 2017-06-08 SURGERY — ANTERIOR LATERAL LUMBAR FUSION WITH PERCUTANEOUS SCREW 2 LEVEL
Anesthesia: General

## 2017-07-01 ENCOUNTER — Telehealth: Payer: Self-pay

## 2017-07-01 ENCOUNTER — Other Ambulatory Visit: Payer: Self-pay

## 2017-07-01 MED ORDER — TRAZODONE HCL 100 MG PO TABS: ORAL_TABLET | ORAL | 0 refills | Status: DC

## 2017-07-01 NOTE — Telephone Encounter (Signed)
Error

## 2017-07-08 ENCOUNTER — Encounter: Payer: Self-pay | Admitting: Family Medicine

## 2017-07-08 ENCOUNTER — Ambulatory Visit: Payer: Medicare Other | Admitting: Family Medicine

## 2017-07-08 VITALS — BP 128/78 | HR 63 | Temp 97.2°F | Ht 60.75 in | Wt 166.6 lb

## 2017-07-08 DIAGNOSIS — R51 Headache: Secondary | ICD-10-CM | POA: Diagnosis not present

## 2017-07-08 DIAGNOSIS — R519 Headache, unspecified: Secondary | ICD-10-CM

## 2017-07-08 HISTORY — DX: Headache, unspecified: R51.9

## 2017-07-08 NOTE — Patient Instructions (Signed)
Consider massage  Glad headache went away with aleve  Still if your overall pattern worsens I want to know immediately  Otherwise, I can place a referral for you after you are healed up from the back to see Dr. Jannifer Franklin  I may talk to radiology and may discuss with you imaging sooner

## 2017-07-08 NOTE — Progress Notes (Signed)
Subjective:  [name] is a 81 y.o. year old very pleasant female patient who presents for/with See problem oriented charting ROS- continued headache that recurs daily for now 18 months, no blurry vision or double vision, no paresthesias   Past Medical History-  Patient Active Problem List   Diagnosis Date Noted  . Lumbar stenosis with neurogenic claudication 03/20/2016    Priority: Medium  . Scalp pain 10/22/2015    Priority: Medium  . Macular degeneration of right eye 03/03/2013    Priority: Medium  . Glaucoma 03/03/2013    Priority: Medium  . Hyperlipidemia 03/14/2009    Priority: Medium  . Depression 10/19/2007    Priority: Medium  . Essential hypertension 10/19/2007    Priority: Medium  . GASTROESOPHAGEAL REFLUX DISEASE 10/19/2007    Priority: Medium  . Vaginal itching 04/04/2016    Priority: Low  . Post-traumatic headache 10/11/2015    Priority: Low  . Routine health maintenance 03/06/2013    Priority: Low  . Osteoarthritis 10/19/2007    Priority: Low  . STRESS INCONTINENCE 10/19/2007    Priority: Low  . Allergic state 10/19/2007    Priority: Low  . Chronic daily headache 07/08/2017  . Memory changes 03/06/2017    Medications- reviewed and updated Current Outpatient Medications  Medication Sig Dispense Refill  . acetaminophen (TYLENOL) 500 MG tablet Take 1,000 mg by mouth every 6 (six) hours as needed for headache.    Marland Kitchen aspirin 81 MG tablet Take 81 mg by mouth daily.     . clobetasol ointment (TEMOVATE) 0.05 % Apply topically 2 (two) times daily. Do not use for more than 7 days in a row. 30 g 1  . COMBIGAN 0.2-0.5 % ophthalmic solution Place 1 drop into both eyes daily.   0  . Mirabegron (MYRBETRIQ PO) Take 50 mg by mouth daily.    . nadolol (CORGARD) 40 MG tablet take 1 tablet by mouth at bedtime 90 tablet 1  . omeprazole (PRILOSEC) 10 MG capsule Take 10 mg by mouth daily.    Marland Kitchen venlafaxine XR (EFFEXOR-XR) 150 MG 24 hr capsule Take 1 capsule (150 mg  total) by mouth 2 (two) times daily. 60 capsule 3   Objective: BP 128/78 (BP Location: Left Arm, Patient Position: Sitting, Cuff Size: Large)   Pulse 63   Temp (!) 97.2 F (36.2 C) (Oral)   Ht 5' 0.75" (1.543 m)   Wt 166 lb 9.6 oz (75.6 kg)   LMP 08/11/1974   SpO2 98%   BMI 31.74 kg/m  Gen: NAD, resting comfortably CV: RRR no murmurs rubs or gallops Lungs: CTAB no crackles, wheeze, rhonchi Abdomen: soft/nontender/nondistended/normal bowel sounds.  Ext: no edema Skin: warm, dry, no rash on scalp Neuro: CN II-XII intact, sensation and reflexes normal throughout, 5/5 muscle strength in bilateral upper and lower extremities. Normal finger to nose. Normal rapid alternating movements. No pronator drift. Normal romberg. Normal gait.  Msk: tension in right side of neck- also tender in that area. Also tender in occipital area with palpation  Assessment/Plan:  Chronic daily headache S: Patient complians of headache. She states she fell and hit the back of her head a year ago 10/19/15. Tdap was up to date. Vision was intact at time. CT head normal one year ago. Staples were removed 10 days later #4- trouble getting staples out. Only on aspirin - no other blood thinners  Has throbbing sensation. Feels like head is swelling. Eyes hurt and also above the eyes. Ears hurt as  well as back of neck. No blurry vision. Aleve lessens pain but never leaves- worst pain gets up to 6/10, yesterday was 10/10. With aleve gets to 0/10 including yesterday, then slowly comes back and almost every morning wakes up with it.  A/P: Patient with chronic daily headache for now 18 months- was worse yesterday than normal but resolved with aleve. Does have tension in neck and discussed potential massage.  Had CT at time of injury then MRI 14months later. Had nonhealing wound which did finally heal- will reach out to radiology to clarify line about potential foreign body but doubt this is foreign body related. Discussed close  follow up. Has neurosurgery consult for back pain soon and wants to focus on back first but after that agrees to reach out and I can refer back to neurolgoy who evaluated her last year in September- Dr. Jannifer Franklin for his opinion. I discussed with consistent headache and prior imaging low risk of cancer or bleed. Will have our clinical team reach out to radiology for clarificaiton on MRI comment "No susceptibility artifact is suggests retained metallic foreign body." but suspect there was no foreign body based on remainder of documentation  Future Appointments  Date Time Provider [place]  05/14/2018  9:30 AM Megan Salon, MD [place] None   The duration of face-to-face time during this visit was greater than 25 minutes. Greater than 50% of this time was spent in counseling, explanation of diagnosis, planning of further management, and/or coordination of care including discussion of prior injury and how difficult it has been to have over a year worth of headaches, reassuring about foreign body, discussing follow up options.    Return precautions advised. Discussed sooner follow up for new or worsening symptoms particularly recurrent higher level headaches like yesterday Garret Reddish, MD

## 2017-07-08 NOTE — Assessment & Plan Note (Signed)
S: Patient complians of headache. She states she fell and hit the back of her head a year ago 10/19/15. Tdap was up to date. Vision was intact at time. CT head normal one year ago. Staples were removed 10 days later #4- trouble getting staples out. Only on aspirin - no other blood thinners  Has throbbing sensation. Feels like head is swelling. Eyes hurt and also above the eyes. Ears hurt as well as back of neck. No blurry vision. Aleve lessens pain but never leaves- worst pain gets up to 6/10, yesterday was 10/10. With aleve gets to 0/10 including yesterday, then slowly comes back and almost every morning wakes up with it.  A/P: Patient with chronic daily headache for now 18 months- was worse yesterday than normal but resolved with aleve. Does have tension in neck and discussed potential massage.  Had CT at time of injury then MRI 42months later. Had nonhealing wound which did finally heal- will reach out to radiology to clarify line about potential foreign body but doubt this is foreign body related. Discussed close follow up. Has neurosurgery consult for back pain soon and wants to focus on back first but after that agrees to reach out and I can refer back to neurolgoy who evaluated her last year in September- Dr. Jannifer Franklin for his opinion. I discussed with consistent headache and prior imaging low risk of cancer or bleed. Will have our clinical team reach out to radiology for clarificaiton on MRI comment "No susceptibility artifact is suggests retained metallic foreign body." but suspect there was no foreign body based on remainder of documentation

## 2017-07-16 ENCOUNTER — Other Ambulatory Visit: Payer: Self-pay | Admitting: Family Medicine

## 2017-08-02 ENCOUNTER — Other Ambulatory Visit: Payer: Self-pay | Admitting: Family Medicine

## 2017-08-12 ENCOUNTER — Other Ambulatory Visit: Payer: Self-pay | Admitting: Family Medicine

## 2017-08-28 ENCOUNTER — Other Ambulatory Visit: Payer: Self-pay | Admitting: Family Medicine

## 2017-10-06 ENCOUNTER — Telehealth: Payer: Self-pay | Admitting: Family Medicine

## 2017-10-06 NOTE — Telephone Encounter (Signed)
Copied from [place]. Topic: Quick Communication - Rx Refill/Question >> Oct 06, 2017  4:25 PM Neva Seat wrote: Vytorin -   4 left   [facility] [address] [place], [place] [address] Phone: [phone] Fax: [phone]

## 2017-10-07 NOTE — Telephone Encounter (Signed)
Pt requesting refill of Vytorin. Medication not listed in current medication list.   LOV: 07/08/17  PCP: Dr. Yong Channel  Pharmacy: [facility]  [address]

## 2017-10-08 ENCOUNTER — Other Ambulatory Visit: Payer: Self-pay

## 2017-10-08 NOTE — Telephone Encounter (Signed)
Ok with me. Please place any necessary orders. 

## 2017-10-09 NOTE — Telephone Encounter (Signed)
The patient called again. She says she is down to one pill. The refill is for the vytorin. It was originally prescribed by Pricilla Holm. The patient is a little concerned. She believes Dr Yong Channel takes a while to give her her refills. She would like a call soon.

## 2017-10-10 ENCOUNTER — Other Ambulatory Visit: Payer: Self-pay | Admitting: Family Medicine

## 2017-10-10 ENCOUNTER — Encounter: Payer: Self-pay | Admitting: Family Medicine

## 2017-10-10 MED ORDER — EZETIMIBE-SIMVASTATIN 10-20 MG PO TABS: 1.0000 | ORAL_TABLET | Freq: Every day | ORAL | 3 refills | Status: DC

## 2017-10-10 NOTE — Telephone Encounter (Signed)
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 I do my best to take good notes. See last notes on hyperlipidemia which say continue the vytorin. Also under problem list it states vytorin as the treatment under overview. I would prefer for non emergent meds like this that a message be sent to me anyway-instead of sending to Dr. Jerline Pain. When people transfer outside of other [facility] practices I am ok with refills. I am not sure why it was removed from med list- appears to me Roselyn Reef you put it in under "completed course' back in November.   I sent in the medicine and sent patient a mychart message. Please follow up to make sure patient gets this.

## 2017-10-12 NOTE — Telephone Encounter (Signed)
Spoke with patient who verbalized understanding. She states her husband has gone to pick it up from the pharmacy

## 2017-10-13 ENCOUNTER — Other Ambulatory Visit: Payer: Self-pay | Admitting: Neurosurgery

## 2017-10-20 ENCOUNTER — Other Ambulatory Visit: Payer: Self-pay | Admitting: Family Medicine

## 2017-10-21 ENCOUNTER — Other Ambulatory Visit: Payer: Self-pay

## 2017-10-21 ENCOUNTER — Encounter (HOSPITAL_COMMUNITY)
Admission: RE | Admit: 2017-10-21 | Discharge: 2017-10-21 | Disposition: A | Discharge status: Home / Self Care | Payer: Medicare Other | Source: Ambulatory Visit | Attending: Neurosurgery | Admitting: Neurosurgery

## 2017-10-21 ENCOUNTER — Encounter (HOSPITAL_COMMUNITY): Payer: Self-pay

## 2017-10-21 DIAGNOSIS — M48062 Spinal stenosis, lumbar region with neurogenic claudication: Secondary | ICD-10-CM | POA: Insufficient documentation

## 2017-10-21 DIAGNOSIS — Z01818 Encounter for other preprocedural examination: Secondary | ICD-10-CM | POA: Insufficient documentation

## 2017-10-21 DIAGNOSIS — I10 Essential (primary) hypertension: Secondary | ICD-10-CM | POA: Insufficient documentation

## 2017-10-21 HISTORY — DX: Spinal stenosis, lumbar region with neurogenic claudication: M48.062

## 2017-10-21 HISTORY — DX: Other specified postprocedural states: Z98.890

## 2017-10-21 HISTORY — DX: Nausea with vomiting, unspecified: R11.2

## 2017-10-21 HISTORY — DX: Other specified postprocedural states: R11.2

## 2017-10-21 LAB — CBC
HEMATOCRIT: 43.5 % (ref 36.0–46.0)
HEMOGLOBIN: 14.2 g/dL (ref 12.0–15.0)
MCH: 32.9 pg (ref 26.0–34.0)
MCHC: 32.6 g/dL (ref 30.0–36.0)
MCV: 100.7 fL — AB (ref 78.0–100.0)
Platelets: 220 10*3/uL (ref 150–400)
RBC: 4.32 MIL/uL (ref 3.87–5.11)
RDW: 14 % (ref 11.5–15.5)
WBC: 7.3 10*3/uL (ref 4.0–10.5)

## 2017-10-21 LAB — ABO/RH: ABO/RH(D): A NEG

## 2017-10-21 LAB — BASIC METABOLIC PANEL
ANION GAP: 10 (ref 5–15)
BUN: 10 mg/dL (ref 6–20)
CALCIUM: 9.2 mg/dL (ref 8.9–10.3)
CHLORIDE: 104 mmol/L (ref 101–111)
CO2: 25 mmol/L (ref 22–32)
Creatinine, Ser: 0.74 mg/dL (ref 0.44–1.00)
GFR calc non Af Amer: >60 mL/min (ref >60)
Glucose, Bld: 82 mg/dL (ref 65–99)
Potassium: 4.3 mmol/L (ref 3.5–5.1)
Sodium: 139 mmol/L (ref 135–145)

## 2017-10-21 LAB — SURGICAL PCR SCREEN
MRSA, PCR: NEGATIVE
Staphylococcus aureus: NEGATIVE

## 2017-10-21 LAB — TYPE AND SCREEN
ABO/RH(D): A NEG
Antibody Screen: NEGATIVE

## 2017-10-21 NOTE — Pre-Procedure Instructions (Signed)
    [name]  10/21/2017      [id number] - [place], [address] AT [place] [address] [place] [address] Phone: [phone] Fax: [phone]  [facility] [place], [place] [address] [place] [address] Phone: [phone] Fax: [place], [place] [address] Phone: [phone] Fax: [phone]  [facility] [address] [place] - [place] AT [place] [address] Phone: [phone] Fax: [phone]    Your procedure is scheduled on Monday, October 26, 2017  Report to [facility] Admitting at 5:30 A.M.  Call this number if you have problems the morning of surgery:  [phone]   Remember: Follow doctors instructions regarding Aspirin  Do not eat food or drink liquids after midnight Sunday, October 25, 2017  Take these medicines the morning of surgery with A SIP OF WATER : omeprazole (PRILOSEC),  venlafaxine XR (EFFEXOR-XR), COMBIGAN eye drops If needed: acetaminophen (TYLENOL) for pain, sodium chloride (OCEAN) nasal spray for congestion. Stop taking vitamins, fish oil, and herbal medications. Do not take any NSAIDs ie: Ibuprofen, Advil, Naproxen (Aleve), Motrin, BC and Goody Powder; stop now.   Do not wear jewelry, make-up or nail polish.  Do not wear lotions, powders, or perfumes, or deodorant.  Do not shave 48 hours prior to surgery.    Do not bring valuables to the hospital.  [facility] is not responsible for any belongings or valuables.  Contacts, dentures or bridgework may not be worn into surgery.  Leave your suitcase in the car.  After surgery it may be brought to your room. For patients admitted to the hospital, discharge  time will be determined by your treatment team. Patients discharged the day of surgery will not be allowed to drive home.  Special instructions: See " Preparing for Surgery " instructions sheet Please read over the following fact sheets that you were given. Pain Booklet, Coughing and Deep Breathing, MRSA Information and Surgical Site Infection Prevention

## 2017-10-21 NOTE — Progress Notes (Signed)
Pt denies SOB, chest pain, and being under the care of a cardiologist. Pt denies having a cardiac cath and echo but stated that a stress test was performed > 10 years ago. Pt denies having an EKG and chest x ray within the last year. Pt denies recent labs. Pt stated that last dose of Aspirin was Monday as instructed .

## 2017-10-25 ENCOUNTER — Encounter (HOSPITAL_COMMUNITY): Payer: Self-pay | Admitting: Anesthesiology

## 2017-10-25 NOTE — Anesthesia Preprocedure Evaluation (Addendum)
Anesthesia Evaluation  Patient identified by MRN, date of birth, ID band Patient awake    Reviewed: Allergy & Precautions, NPO status , Patient's Chart, lab work & pertinent test results  History of Anesthesia Complications (+) PONV and history of anesthetic complications  Airway Mallampati: I  TM Distance: >3 FB Neck ROM: Full    Dental  (+) Teeth Intact, Dental Advisory Given   Pulmonary former smoker,    breath sounds clear to auscultation       Cardiovascular hypertension,  Rhythm:Regular Rate:Normal     Neuro/Psych  Headaches, PSYCHIATRIC DISORDERS Depression    GI/Hepatic Neg liver ROS, GERD  Medicated,  Endo/Other  negative endocrine ROS  Renal/GU negative Renal ROS  negative genitourinary   Musculoskeletal  (+) Arthritis ,   Abdominal   Peds negative pediatric ROS (+)  Hematology negative hematology ROS (+)   Anesthesia Other Findings   Reproductive/Obstetrics negative OB ROS                            Lab Results  Component Value Date   WBC 7.3 10/21/2017   HGB 14.2 10/21/2017   HCT 43.5 10/21/2017   MCV 100.7 (H) 10/21/2017   PLT 220 10/21/2017   No results found for: INR, PROTIME  10/2015 EKG: normal sinus rhythm.   Anesthesia Physical  Anesthesia Plan  ASA: II  Anesthesia Plan: General   Post-op Pain Management:    Induction: Intravenous  PONV Risk Score and Plan:   Airway Management Planned: Oral ETT  Additional Equipment:   Intra-op Plan:   Post-operative Plan: Extubation in OR  Informed Consent:   Plan Discussed with: CRNA  Anesthesia Plan Comments:         Anesthesia Quick Evaluation

## 2017-10-26 ENCOUNTER — Encounter (HOSPITAL_COMMUNITY): Payer: Self-pay | Admitting: *Deleted

## 2017-10-26 ENCOUNTER — Inpatient Hospital Stay (HOSPITAL_COMMUNITY): Payer: Medicare Other

## 2017-10-26 ENCOUNTER — Inpatient Hospital Stay (HOSPITAL_COMMUNITY): Payer: Medicare Other | Admitting: Anesthesiology

## 2017-10-26 ENCOUNTER — Inpatient Hospital Stay (HOSPITAL_COMMUNITY)
Admission: RE | Disposition: A | Discharge status: Home / Self Care | Payer: Self-pay | Source: Ambulatory Visit | Attending: Neurosurgery

## 2017-10-26 ENCOUNTER — Inpatient Hospital Stay (HOSPITAL_COMMUNITY)
Admission: RE | Admit: 2017-10-26 | Discharge: 2017-10-27 | DRG: 460 | Disposition: A | Discharge status: Home / Self Care | Payer: Medicare Other | Source: Ambulatory Visit | Attending: Neurosurgery | Admitting: Neurosurgery

## 2017-10-26 DIAGNOSIS — R32 Unspecified urinary incontinence: Secondary | ICD-10-CM | POA: Diagnosis present

## 2017-10-26 DIAGNOSIS — Z79899 Other long term (current) drug therapy: Secondary | ICD-10-CM | POA: Diagnosis not present

## 2017-10-26 DIAGNOSIS — Z7982 Long term (current) use of aspirin: Secondary | ICD-10-CM | POA: Diagnosis not present

## 2017-10-26 DIAGNOSIS — K219 Gastro-esophageal reflux disease without esophagitis: Secondary | ICD-10-CM | POA: Diagnosis present

## 2017-10-26 DIAGNOSIS — M8588 Other specified disorders of bone density and structure, other site: Secondary | ICD-10-CM | POA: Diagnosis present

## 2017-10-26 DIAGNOSIS — Z87440 Personal history of urinary (tract) infections: Secondary | ICD-10-CM | POA: Diagnosis not present

## 2017-10-26 DIAGNOSIS — M48062 Spinal stenosis, lumbar region with neurogenic claudication: Secondary | ICD-10-CM | POA: Diagnosis present

## 2017-10-26 DIAGNOSIS — E785 Hyperlipidemia, unspecified: Secondary | ICD-10-CM | POA: Diagnosis present

## 2017-10-26 DIAGNOSIS — I10 Essential (primary) hypertension: Secondary | ICD-10-CM | POA: Diagnosis present

## 2017-10-26 DIAGNOSIS — M4726 Other spondylosis with radiculopathy, lumbar region: Secondary | ICD-10-CM | POA: Diagnosis present

## 2017-10-26 DIAGNOSIS — M1990 Unspecified osteoarthritis, unspecified site: Secondary | ICD-10-CM | POA: Diagnosis present

## 2017-10-26 DIAGNOSIS — Z803 Family history of malignant neoplasm of breast: Secondary | ICD-10-CM | POA: Diagnosis not present

## 2017-10-26 DIAGNOSIS — H3530 Unspecified macular degeneration: Secondary | ICD-10-CM | POA: Diagnosis present

## 2017-10-26 DIAGNOSIS — Z8261 Family history of arthritis: Secondary | ICD-10-CM

## 2017-10-26 DIAGNOSIS — H409 Unspecified glaucoma: Secondary | ICD-10-CM | POA: Diagnosis present

## 2017-10-26 DIAGNOSIS — Z90710 Acquired absence of both cervix and uterus: Secondary | ICD-10-CM

## 2017-10-26 DIAGNOSIS — M4185 Other forms of scoliosis, thoracolumbar region: Secondary | ICD-10-CM | POA: Diagnosis present

## 2017-10-26 DIAGNOSIS — Z8042 Family history of malignant neoplasm of prostate: Secondary | ICD-10-CM

## 2017-10-26 DIAGNOSIS — Z885 Allergy status to narcotic agent status: Secondary | ICD-10-CM

## 2017-10-26 DIAGNOSIS — Z887 Allergy status to serum and vaccine status: Secondary | ICD-10-CM

## 2017-10-26 DIAGNOSIS — E7800 Pure hypercholesterolemia, unspecified: Secondary | ICD-10-CM | POA: Diagnosis present

## 2017-10-26 DIAGNOSIS — M5116 Intervertebral disc disorders with radiculopathy, lumbar region: Secondary | ICD-10-CM | POA: Diagnosis present

## 2017-10-26 DIAGNOSIS — Z9049 Acquired absence of other specified parts of digestive tract: Secondary | ICD-10-CM | POA: Diagnosis not present

## 2017-10-26 DIAGNOSIS — M545 Low back pain: Secondary | ICD-10-CM | POA: Diagnosis present

## 2017-10-26 DIAGNOSIS — Z419 Encounter for procedure for purposes other than remedying health state, unspecified: Secondary | ICD-10-CM

## 2017-10-26 DIAGNOSIS — Z8249 Family history of ischemic heart disease and other diseases of the circulatory system: Secondary | ICD-10-CM | POA: Diagnosis not present

## 2017-10-26 DIAGNOSIS — F329 Major depressive disorder, single episode, unspecified: Secondary | ICD-10-CM | POA: Diagnosis present

## 2017-10-26 HISTORY — PX: LUMBAR PERCUTANEOUS PEDICLE SCREW 2 LEVEL: SHX5561

## 2017-10-26 HISTORY — PX: ANTERIOR LAT LUMBAR FUSION: SHX1168

## 2017-10-26 SURGERY — ANTERIOR LATERAL LUMBAR FUSION 2 LEVELS
Anesthesia: General

## 2017-10-26 MED ORDER — THROMBIN 5000 UNITS EX SOLR
CUTANEOUS | Status: DC | PRN
  Administered 2017-10-26: 10000 [IU] via TOPICAL

## 2017-10-26 MED ORDER — FENTANYL CITRATE (PF) 100 MCG/2ML IJ SOLN
INTRAMUSCULAR | Status: DC | PRN
  Administered 2017-10-26 (×7): 50 ug via INTRAVENOUS

## 2017-10-26 MED ORDER — LACTATED RINGERS IV SOLN: INTRAVENOUS | Status: DC

## 2017-10-26 MED ORDER — LIDOCAINE-EPINEPHRINE 1 %-1:100000 IJ SOLN
INTRAMUSCULAR | Status: AC
  Filled 2017-10-26: qty 1

## 2017-10-26 MED ORDER — KETOROLAC TROMETHAMINE 30 MG/ML IJ SOLN
INTRAMUSCULAR | Status: AC
  Filled 2017-10-26: qty 1

## 2017-10-26 MED ORDER — VENLAFAXINE HCL ER 150 MG PO CP24
150.0000 mg | ORAL_CAPSULE | Freq: Two times a day (BID) | ORAL | Status: DC
  Administered 2017-10-26 – 2017-10-27 (×2): 150 mg via ORAL
  Filled 2017-10-26: qty 2
  Filled 2017-10-26 (×2): qty 1

## 2017-10-26 MED ORDER — HYDROMORPHONE HCL 1 MG/ML IJ SOLN
INTRAMUSCULAR | Status: AC
  Filled 2017-10-26: qty 1

## 2017-10-26 MED ORDER — PANTOPRAZOLE SODIUM 40 MG PO TBEC
40.0000 mg | DELAYED_RELEASE_TABLET | Freq: Every day | ORAL | Status: DC
  Administered 2017-10-26 – 2017-10-27 (×2): 40 mg via ORAL
  Filled 2017-10-26 (×2): qty 1

## 2017-10-26 MED ORDER — SODIUM CHLORIDE 0.9% FLUSH: 3.0000 mL | INTRAVENOUS | Status: DC | PRN

## 2017-10-26 MED ORDER — SODIUM CHLORIDE 0.9 % IR SOLN
Status: DC | PRN
  Administered 2017-10-26: 09:00:00

## 2017-10-26 MED ORDER — SODIUM CHLORIDE 0.9 % IV SOLN: 250.0000 mL | INTRAVENOUS | Status: DC

## 2017-10-26 MED ORDER — LACTATED RINGERS IV SOLN
INTRAVENOUS | Status: DC | PRN
  Administered 2017-10-26 (×2): via INTRAVENOUS

## 2017-10-26 MED ORDER — MENTHOL 3 MG MT LOZG
1.0000 | LOZENGE | OROMUCOSAL | Status: DC | PRN
  Filled 2017-10-26: qty 9

## 2017-10-26 MED ORDER — FENTANYL CITRATE (PF) 250 MCG/5ML IJ SOLN
INTRAMUSCULAR | Status: AC
  Filled 2017-10-26: qty 5

## 2017-10-26 MED ORDER — EPHEDRINE SULFATE 50 MG/ML IJ SOLN
INTRAMUSCULAR | Status: DC | PRN
  Administered 2017-10-26: 5 mg via INTRAVENOUS
  Administered 2017-10-26: 10 mg via INTRAVENOUS

## 2017-10-26 MED ORDER — ACETAMINOPHEN 325 MG PO TABS: 650.0000 mg | ORAL_TABLET | ORAL | Status: DC | PRN

## 2017-10-26 MED ORDER — PROMETHAZINE HCL 25 MG/ML IJ SOLN
INTRAMUSCULAR | Status: AC
  Administered 2017-10-26: 6.25 mg via INTRAVENOUS
  Filled 2017-10-26: qty 1

## 2017-10-26 MED ORDER — LIDOCAINE HCL (CARDIAC) 20 MG/ML IV SOLN
INTRAVENOUS | Status: AC
  Filled 2017-10-26: qty 5

## 2017-10-26 MED ORDER — MEPERIDINE HCL 50 MG/ML IJ SOLN: 6.2500 mg | INTRAMUSCULAR | Status: DC | PRN

## 2017-10-26 MED ORDER — HYDROXYZINE HCL 25 MG PO TABS
50.0000 mg | ORAL_TABLET | ORAL | Status: DC | PRN
  Administered 2017-10-26: 50 mg via ORAL
  Filled 2017-10-26: qty 2

## 2017-10-26 MED ORDER — MORPHINE SULFATE (PF) 4 MG/ML IV SOLN: 4.0000 mg | INTRAVENOUS | Status: DC | PRN

## 2017-10-26 MED ORDER — LIDOCAINE HCL (CARDIAC) 20 MG/ML IV SOLN
INTRAVENOUS | Status: DC | PRN
  Administered 2017-10-26: 50 mg via INTRAVENOUS

## 2017-10-26 MED ORDER — HYDROXYZINE HCL 50 MG/ML IM SOLN: 50.0000 mg | INTRAMUSCULAR | Status: DC | PRN

## 2017-10-26 MED ORDER — 0.9 % SODIUM CHLORIDE (POUR BTL) OPTIME
TOPICAL | Status: DC | PRN
  Administered 2017-10-26: 1000 mL

## 2017-10-26 MED ORDER — LIDOCAINE-EPINEPHRINE 1 %-1:100000 IJ SOLN
INTRAMUSCULAR | Status: DC | PRN
  Administered 2017-10-26 (×2): 40 mL

## 2017-10-26 MED ORDER — CHLORHEXIDINE GLUCONATE CLOTH 2 % EX PADS: 6.0000 | MEDICATED_PAD | Freq: Once | CUTANEOUS | Status: DC

## 2017-10-26 MED ORDER — HYDROMORPHONE HCL 1 MG/ML IJ SOLN
0.2500 mg | INTRAMUSCULAR | Status: DC | PRN
  Administered 2017-10-26: 0.25 mg via INTRAVENOUS

## 2017-10-26 MED ORDER — PROPOFOL 10 MG/ML IV BOLUS
INTRAVENOUS | Status: AC
  Filled 2017-10-26: qty 20

## 2017-10-26 MED ORDER — BUPIVACAINE HCL (PF) 0.5 % IJ SOLN
INTRAMUSCULAR | Status: DC | PRN
  Administered 2017-10-26 (×2): 40 mL

## 2017-10-26 MED ORDER — DEXAMETHASONE SODIUM PHOSPHATE 10 MG/ML IJ SOLN
INTRAMUSCULAR | Status: DC | PRN
  Administered 2017-10-26: 8 mg via INTRAVENOUS

## 2017-10-26 MED ORDER — THROMBIN 5000 UNITS EX SOLR
CUTANEOUS | Status: AC
  Filled 2017-10-26: qty 15000

## 2017-10-26 MED ORDER — LACTATED RINGERS IV SOLN
INTRAVENOUS | Status: DC | PRN
  Administered 2017-10-26: 08:00:00 via INTRAVENOUS

## 2017-10-26 MED ORDER — KETOROLAC TROMETHAMINE 15 MG/ML IJ SOLN
INTRAMUSCULAR | Status: AC
  Filled 2017-10-26: qty 1

## 2017-10-26 MED ORDER — ONDANSETRON HCL 4 MG/2ML IJ SOLN
INTRAMUSCULAR | Status: DC | PRN
  Administered 2017-10-26: 4 mg via INTRAVENOUS

## 2017-10-26 MED ORDER — TRIMETHOPRIM 100 MG PO TABS: 100.0000 mg | ORAL_TABLET | Freq: Every day | ORAL | Status: DC

## 2017-10-26 MED ORDER — ONDANSETRON HCL 4 MG/2ML IJ SOLN
INTRAMUSCULAR | Status: AC
  Filled 2017-10-26: qty 2

## 2017-10-26 MED ORDER — HYDROCODONE-ACETAMINOPHEN 5-325 MG PO TABS
1.0000 | ORAL_TABLET | ORAL | Status: DC | PRN
  Administered 2017-10-26 (×2): 2 via ORAL
  Administered 2017-10-27: 1 via ORAL
  Administered 2017-10-27: 2 via ORAL
  Filled 2017-10-26 (×2): qty 2
  Filled 2017-10-26: qty 1
  Filled 2017-10-26: qty 2

## 2017-10-26 MED ORDER — SODIUM CHLORIDE 0.9% FLUSH
3.0000 mL | Freq: Two times a day (BID) | INTRAVENOUS | Status: DC
  Administered 2017-10-26 – 2017-10-27 (×2): 3 mL via INTRAVENOUS

## 2017-10-26 MED ORDER — PROPOFOL 10 MG/ML IV BOLUS
INTRAVENOUS | Status: DC | PRN
  Administered 2017-10-26: 140 mg via INTRAVENOUS

## 2017-10-26 MED ORDER — KETOROLAC TROMETHAMINE 30 MG/ML IJ SOLN
15.0000 mg | Freq: Four times a day (QID) | INTRAMUSCULAR | Status: DC
  Administered 2017-10-26 – 2017-10-27 (×4): 15 mg via INTRAVENOUS
  Filled 2017-10-26 (×4): qty 1

## 2017-10-26 MED ORDER — THROMBIN (RECOMBINANT) 5000 UNITS EX SOLR
OROMUCOSAL | Status: DC | PRN
  Administered 2017-10-26: 09:00:00 via TOPICAL

## 2017-10-26 MED ORDER — CYCLOBENZAPRINE HCL 5 MG PO TABS: 5.0000 mg | ORAL_TABLET | Freq: Three times a day (TID) | ORAL | Status: DC | PRN

## 2017-10-26 MED ORDER — PHENYLEPHRINE 40 MCG/ML (10ML) SYRINGE FOR IV PUSH (FOR BLOOD PRESSURE SUPPORT)
PREFILLED_SYRINGE | INTRAVENOUS | Status: AC
  Filled 2017-10-26: qty 10

## 2017-10-26 MED ORDER — ACETAMINOPHEN 650 MG RE SUPP
650.0000 mg | RECTAL | Status: DC | PRN
Start: 2017-10-26 — End: 2017-10-27

## 2017-10-26 MED ORDER — SUCCINYLCHOLINE CHLORIDE 20 MG/ML IJ SOLN
INTRAMUSCULAR | Status: DC | PRN
  Administered 2017-10-26: 100 mg via INTRAVENOUS

## 2017-10-26 MED ORDER — EZETIMIBE-SIMVASTATIN 10-20 MG PO TABS
1.0000 | ORAL_TABLET | Freq: Every day | ORAL | Status: DC
  Administered 2017-10-26: 1 via ORAL
  Filled 2017-10-26 (×2): qty 1

## 2017-10-26 MED ORDER — NADOLOL 40 MG PO TABS
40.0000 mg | ORAL_TABLET | Freq: Every day | ORAL | Status: DC
  Administered 2017-10-26: 40 mg via ORAL
  Filled 2017-10-26: qty 1

## 2017-10-26 MED ORDER — TRAZODONE 25 MG HALF TABLET
25.0000 mg | ORAL_TABLET | Freq: Every evening | ORAL | Status: DC | PRN
  Filled 2017-10-26: qty 2

## 2017-10-26 MED ORDER — TIMOLOL MALEATE 0.5 % OP SOLN
1.0000 [drp] | Freq: Two times a day (BID) | OPHTHALMIC | Status: DC
  Administered 2017-10-26 – 2017-10-27 (×2): 1 [drp] via OPHTHALMIC
  Filled 2017-10-26: qty 5

## 2017-10-26 MED ORDER — PHENOL 1.4 % MT LIQD: 1.0000 | OROMUCOSAL | Status: DC | PRN

## 2017-10-26 MED ORDER — KETOROLAC TROMETHAMINE 30 MG/ML IJ SOLN
15.0000 mg | Freq: Once | INTRAMUSCULAR | Status: AC
  Administered 2017-10-26: 15 mg via INTRAVENOUS

## 2017-10-26 MED ORDER — PHENYLEPHRINE HCL 10 MG/ML IJ SOLN
INTRAVENOUS | Status: DC | PRN
  Administered 2017-10-26: 15 ug/min via INTRAVENOUS

## 2017-10-26 MED ORDER — BRIMONIDINE TARTRATE-TIMOLOL 0.2-0.5 % OP SOLN: 1.0000 [drp] | Freq: Two times a day (BID) | OPHTHALMIC | Status: DC

## 2017-10-26 MED ORDER — BISACODYL 10 MG RE SUPP: 10.0000 mg | Freq: Every day | RECTAL | Status: DC | PRN

## 2017-10-26 MED ORDER — CEFAZOLIN SODIUM-DEXTROSE 2-4 GM/100ML-% IV SOLN
2.0000 g | INTRAVENOUS | Status: AC
  Administered 2017-10-26: 2 g via INTRAVENOUS
  Filled 2017-10-26: qty 100

## 2017-10-26 MED ORDER — MAGNESIUM HYDROXIDE 400 MG/5ML PO SUSP: 30.0000 mL | Freq: Every day | ORAL | Status: DC | PRN

## 2017-10-26 MED ORDER — BRIMONIDINE TARTRATE 0.2 % OP SOLN
1.0000 [drp] | Freq: Two times a day (BID) | OPHTHALMIC | Status: DC
  Administered 2017-10-26 – 2017-10-27 (×2): 1 [drp] via OPHTHALMIC
  Filled 2017-10-26: qty 5

## 2017-10-26 MED ORDER — ACETAMINOPHEN 10 MG/ML IV SOLN
INTRAVENOUS | Status: DC | PRN
  Administered 2017-10-26: 1000 mg via INTRAVENOUS

## 2017-10-26 MED ORDER — BUPIVACAINE HCL (PF) 0.5 % IJ SOLN
INTRAMUSCULAR | Status: AC
  Filled 2017-10-26: qty 30

## 2017-10-26 MED ORDER — HEMOSTATIC AGENTS (NO CHARGE) OPTIME
TOPICAL | Status: DC | PRN
  Administered 2017-10-26: 1 via TOPICAL

## 2017-10-26 MED ORDER — GLYCOPYRROLATE 0.2 MG/ML IJ SOLN
INTRAMUSCULAR | Status: DC | PRN
  Administered 2017-10-26: 0.1 mg via INTRAVENOUS

## 2017-10-26 MED ORDER — KCL IN DEXTROSE-NACL 20-5-0.45 MEQ/L-%-% IV SOLN
INTRAVENOUS | Status: DC
  Administered 2017-10-26: 15:00:00 via INTRAVENOUS
  Filled 2017-10-26: qty 1000

## 2017-10-26 MED ORDER — ALUM & MAG HYDROXIDE-SIMETH 200-200-20 MG/5ML PO SUSP: 30.0000 mL | Freq: Four times a day (QID) | ORAL | Status: DC | PRN

## 2017-10-26 MED ORDER — FLEET ENEMA 7-19 GM/118ML RE ENEM: 1.0000 | ENEMA | Freq: Once | RECTAL | Status: DC | PRN

## 2017-10-26 MED ORDER — PROMETHAZINE HCL 25 MG/ML IJ SOLN
6.2500 mg | INTRAMUSCULAR | Status: AC | PRN
  Administered 2017-10-26 (×2): 6.25 mg via INTRAVENOUS

## 2017-10-26 SURGICAL SUPPLY — 60 items
ADH SKN CLS APL DERMABOND .7 (GAUZE/BANDAGES/DRESSINGS) ×12
BLADE CLIPPER SURG (BLADE) IMPLANT
CAGE COROENT 12X18X50 (Cage) ×1 IMPLANT
CLIP NEUROVISION LG (CLIP) ×1 IMPLANT
DERMABOND ADVANCED (GAUZE/BANDAGES/DRESSINGS) ×6
DERMABOND ADVANCED .7 DNX12 (GAUZE/BANDAGES/DRESSINGS) ×2 IMPLANT
DRAPE C-ARM 42X72 X-RAY (DRAPES) ×4 IMPLANT
DRAPE C-ARMOR (DRAPES) ×4 IMPLANT
DRAPE LAPAROTOMY 100X72X124 (DRAPES) ×4 IMPLANT
DRAPE POUCH INSTRU U-SHP 10X18 (DRAPES) ×3 IMPLANT
DURAPREP 26ML APPLICATOR (WOUND CARE) ×2 IMPLANT
ELECT REM PT RETURN 9FT ADLT (ELECTROSURGICAL) ×6
ELECTRODE REM PT RTRN 9FT ADLT (ELECTROSURGICAL) ×2 IMPLANT
GAUZE SPONGE 4X4 16PLY XRAY LF (GAUZE/BANDAGES/DRESSINGS) IMPLANT
GLOVE BIOGEL PI IND STRL 8 (GLOVE) ×2 IMPLANT
GLOVE BIOGEL PI INDICATOR 8 (GLOVE) ×3
GLOVE ECLIPSE 7.5 STRL STRAW (GLOVE) ×3 IMPLANT
GLOVE EXAM NITRILE LRG STRL (GLOVE) IMPLANT
GLOVE EXAM NITRILE XL STR (GLOVE) IMPLANT
GLOVE EXAM NITRILE XS STR PU (GLOVE) IMPLANT
GOWN STRL REUS W/ TWL LRG LVL3 (GOWN DISPOSABLE) IMPLANT
GOWN STRL REUS W/ TWL XL LVL3 (GOWN DISPOSABLE) ×4 IMPLANT
GOWN STRL REUS W/TWL 2XL LVL3 (GOWN DISPOSABLE) IMPLANT
GOWN STRL REUS W/TWL LRG LVL3 (GOWN DISPOSABLE)
GOWN STRL REUS W/TWL XL LVL3 (GOWN DISPOSABLE) ×6
GUIDEWIRE NITINOL BEVEL TIP (WIRE) ×6 IMPLANT
KIT BASIN OR (CUSTOM PROCEDURE TRAY) ×4 IMPLANT
KIT DILATOR XLIF 5 (KITS) ×1 IMPLANT
KIT INFUSE XX SMALL 0.7CC (Orthopedic Implant) ×1 IMPLANT
KIT ROOM TURNOVER OR (KITS) ×3 IMPLANT
KIT SURGICAL ACCESS MAXCESS 4 (KITS) ×1 IMPLANT
MARKER SKIN DUAL TIP RULER LAB (MISCELLANEOUS) ×1 IMPLANT
MODULE EMG NDL SSEP NVM5 (NEEDLE) IMPLANT
MODULE EMG NEEDLE SSEP NVM5 (NEEDLE) ×6 IMPLANT
MODULE NVM5 NEXT GEN EMG (NEEDLE) ×1 IMPLANT
NDL HYPO 25X1 1.5 SAFETY (NEEDLE) ×2 IMPLANT
NDL I-PASS III (NEEDLE) IMPLANT
NDL SPNL 22GX3.5 QUINCKE BK (NEEDLE) IMPLANT
NEEDLE HYPO 22GX1.5 SAFETY (NEEDLE) ×1 IMPLANT
NEEDLE HYPO 25X1 1.5 SAFETY (NEEDLE) ×3 IMPLANT
NEEDLE I-PASS III (NEEDLE) ×3 IMPLANT
NEEDLE SPNL 22GX3.5 QUINCKE BK (NEEDLE) ×6 IMPLANT
NS IRRIG 1000ML POUR BTL (IV SOLUTION) ×3 IMPLANT
PACK LAMINECTOMY NEURO (CUSTOM PROCEDURE TRAY) ×4 IMPLANT
ROD RELINE MAS LORD 5.5X75MM (Rod) ×1 IMPLANT
ROD SPINAL 5.5X80 TI LORDOSE (Rod) ×1 IMPLANT
SCREW LOCK RELINE 5.5 TULIP (Screw) ×6 IMPLANT
SCREW RELINE MAS POLY 6.5X40MM (Screw) ×2 IMPLANT
SCREW RELINE MAS RED 5.5X45MM (Screw) ×4 IMPLANT
SPACER COROENT XL 10X18X50 10D (Spacer) ×1 IMPLANT
SPONGE LAP 4X18 X RAY DECT (DISPOSABLE) IMPLANT
STRIP BIOACTIVE VITOSS 25X100X (Neuro Prosthesis/Implant) ×1 IMPLANT
SUT VIC AB 1 CT1 18XBRD ANBCTR (SUTURE) IMPLANT
SUT VIC AB 1 CT1 8-18 (SUTURE) ×9
SUT VIC AB 2-0 CP2 18 (SUTURE) ×10 IMPLANT
SUT VIC AB 3-0 SH 8-18 (SUTURE) ×7 IMPLANT
TOWEL GREEN STERILE (TOWEL DISPOSABLE) ×3 IMPLANT
TOWEL GREEN STERILE FF (TOWEL DISPOSABLE) ×3 IMPLANT
TRAY FOLEY W/METER SILVER 16FR (SET/KITS/TRAYS/PACK) ×3 IMPLANT
WATER STERILE IRR 1000ML POUR (IV SOLUTION) ×3 IMPLANT

## 2017-10-26 NOTE — Anesthesia Postprocedure Evaluation (Signed)
Anesthesia Post Note  Patient: [name]  Procedure(s) Performed: LUMBAR THREE- LUMBAR FOUR, LUMBAR FOUR- LUMBAR FIVE ANTEROLATERAL LUMBAR INTERBODY ARTHRODESIS (N/A ) LUMBAR THREE- LUMBAR FOUR, LUMBAR FOUR- LUMBAR FIVE PERCUTANEOUS PEDICLE SCREW FIXATION     Patient location during evaluation: PACU Anesthesia Type: General Level of consciousness: awake and alert Pain management: pain level controlled Vital Signs Assessment: post-procedure vital signs reviewed and stable Respiratory status: spontaneous breathing, nonlabored ventilation, respiratory function stable and patient connected to nasal cannula oxygen Cardiovascular status: blood pressure returned to baseline and stable Postop Assessment: no apparent nausea or vomiting Anesthetic complications: no    Last Vitals:  Vitals:   10/26/17 1330 10/26/17 1345  BP: 130/67 132/66  Pulse: 74 75  Resp: 20 18  Temp:  (!) 36 C  SpO2: 97% 98%    Last Pain:  Vitals:   10/26/17 1330  TempSrc:   PainSc: Asleep                 Effie Berkshire

## 2017-10-26 NOTE — H&P (Signed)
Subjective: Patient is a 82 y.o. right handed white female who is admitted for treatment of ow back and right lumbar pain.  She is status post a previous right L4-5 lumbar laminotomy and foraminotomy in August 2017. She is developed progressive degeneration at L4-5 with particular loss of di right side at L4-5 and increasing scoliotic deformity. She is admitted now for a 2 level L3-4 and L4-5 anterolateral lumbar interbody arthrodesis with interbody implants and bone graft and a bilateral L3-5 percutaneous pedicle screw fixation.   Patient Active Problem List   Diagnosis Date Noted  . Chronic daily headache 07/08/2017  . Memory changes 03/06/2017  . Vaginal itching 04/04/2016  . Lumbar stenosis with neurogenic claudication 03/20/2016  . Scalp pain 10/22/2015  . Post-traumatic headache 10/11/2015  . Routine health maintenance 03/06/2013  . Macular degeneration of right eye 03/03/2013  . Glaucoma 03/03/2013  . Hyperlipidemia 03/14/2009  . Depression 10/19/2007  . Essential hypertension 10/19/2007  . GASTROESOPHAGEAL REFLUX DISEASE 10/19/2007  . Osteoarthritis 10/19/2007  . STRESS INCONTINENCE 10/19/2007  . Allergic state 10/19/2007   Past Medical History:  Diagnosis Date  . Anxiety   . Colon polyps   . Complication of anesthesia    takes longer to wake up- admitted overnight after colonoscopy  . Depression   . GERD (gastroesophageal reflux disease)   . Glaucoma   . Headache    migraines  . Hx: UTI (urinary tract infection)   . Hypercholesteremia   . Hypertension   . Lichen simplex chronicus   . Lumbar stenosis with neurogenic claudication   . Macular degeneration   . Osteoarthritis   . Osteopenia    of the hip  . PONV (postoperative nausea and vomiting)   . Stress incontinence, female   . Unsteady gait   . Urinary urgency   . Wears glasses   . Whooping cough    as a baby    Past Surgical History:  Procedure Laterality Date  . ABDOMINAL HYSTERECTOMY     ovaries  remain  . APPENDECTOMY    . BLADDER REPAIR     x two  . CHOLECYSTECTOMY    . COLONOSCOPY    . EYE SURGERY  11/14, 12/14   Macular Dengeneration, Glaucoma  . EYE SURGERY  2016   left eye  . LESION REMOVAL Right 06/28/2013   Procedure: EXCISION 3 cm right labial sebaceous cyst;  Surgeon: Lyman Speller, MD;  Location: [facility];  Service: Gynecology;  Laterality: Right;  . LUMBAR LAMINECTOMY/DECOMPRESSION MICRODISCECTOMY Right 03/20/2016   Procedure: Right - Lumbar four-five lumbar laminotomy, foraminotomy, and possible microdiscectomy;  Surgeon: Jovita Gamma, MD;  Location: [facility];  Service: Neurosurgery;  Laterality: Right;  right  . MOHS SURGERY     procedure to remove basal cell  . TONSILLECTOMY AND ADENOIDECTOMY    . URETHRAL SLING  2007  . URETHRAL SLING  1/12   midurethral   . vertebroplasty secondary to traumatic compression fracture      Medications Prior to Admission  Medication Sig Dispense Refill Last Dose  . aspirin 81 MG tablet Take 81 mg by mouth daily.    Past Week at Unknown time  . COMBIGAN 0.2-0.5 % ophthalmic solution Place 1 drop into both eyes every 12 (twelve) hours.   0 10/25/2017 at Unknown time  . ezetimibe-simvastatin (VYTORIN) 10-20 MG tablet Take 1 tablet by mouth daily. (Patient taking differently: Take 1 tablet by mouth daily at 6 PM. ) 90 tablet 3 10/25/2017 at Unknown  time  . nadolol (CORGARD) 40 MG tablet take 1 tablet by mouth once daily at bedtime (Patient taking differently: take 1 tablet (40mg ) by mouth once daily at bedtime) 90 tablet 1 10/25/2017 at 2200  . omeprazole (PRILOSEC) 20 MG capsule Take 20 mg by mouth daily before breakfast.   Past Week at Unknown time  . sodium chloride (OCEAN) 0.65 % SOLN nasal spray Place 1 spray into both nostrils 2 (two) times daily as needed for congestion.   Past Week at Unknown time  . traZODone (DESYREL) 100 MG tablet TAKE 1/4 TO 1/2 TABLET BY MOUTH AT BEDTIME IF NEEDED FOR SLEEP 15 tablet 1 10/25/2017 at  Unknown time  . venlafaxine XR (EFFEXOR-XR) 150 MG 24 hr capsule take 1 capsule by mouth twice a day (Patient taking differently: take 1 capsule (150mg ) by mouth twice a day) 60 capsule 3 10/26/2017 at Unknown time  . acetaminophen (TYLENOL) 500 MG tablet Take 500 mg by mouth daily as needed for mild pain or headache.    Unknown at Unknown time  . clobetasol ointment (TEMOVATE) 0.05 % Apply topically 2 (two) times daily. Do not use for more than 7 days in a row. (Patient taking differently: Apply 1 application topically daily as needed (rash). Do not use for more than 7 days in a row.) 30 g 1 Unknown at Unknown time  . trimethoprim (TRIMPEX) 100 MG tablet Take 100 mg by mouth daily.  1 More than a month at Unknown time   Allergies  Allergen Reactions  . Codeine Nausea And Vomiting  . Shingrix [Zoster Vac Recomb Adjuvanted] Swelling and Rash    Rash and temp x several days. (low grade 100 -101 )     Social History   Tobacco Use  . Smoking status: Former Smoker    Last attempt to quit: 08/11/1965    Years since quitting: 52.2  . Smokeless tobacco: Never Used  . Tobacco comment: has not smoked in 45 years   Substance Use Topics  . Alcohol use: Yes    Alcohol/week: 0.6 oz    Types: 1 Standard drinks or equivalent per week    Comment: occ     Family History  Problem Relation Age of Onset  . Congestive Heart Failure Mother   . Thyroid disease Mother   . Osteoporosis Mother   . Alcoholism Mother   . Prostate cancer Father   . Breast cancer Maternal Grandmother   . Hypertension Sister   . Thyroid disease Sister   . Cancer Sister        brain ?  Marland Kitchen Rheum arthritis Daughter      Review of Systems A comprehensive review of systems was negative.  Objective: Vital signs in last 24 hours: Temp:  [97.8 F (36.6 C)] 97.8 F (36.6 C) (03/18 0600) Pulse Rate:  [66] 66 (03/18 0600) Resp:  [18] 18 (03/18 0600) BP: (152)/(63) 152/63 (03/18 0600) SpO2:  [96 %] 96 % (03/18 0600) Weight:   [73.9 kg (163 lb)] 73.9 kg (163 lb) (03/18 0600)  EXAM:  Patient is a well-developed well-nourished white female in no acute distress. Lungs are clear to auscultation , the patient has symmetrical respiratory excursion. Heart has a regular rate and rhythm normal S1 and S2 no murmur.   Abdomen is soft nontender nondistended bowel sounds are present. Extremity examination shows no clubbing cyanosis or edema. Motor examination shows 5 over 5 strength in the lower extremities including the iliopsoas quadriceps dorsiflexor extensor hallicus  longus  and plantar flexor bilaterally. Sensation is intact to pinprick in the distal lower extremities. Reflexes are symmetrical bilaterally. No pathologic reflexes are present. Patient has a normal gait and stance.   Data Review:CBC    Component Value Date/Time   WBC 7.3 10/21/2017 1416   RBC 4.32 10/21/2017 1416   HGB 14.2 10/21/2017 1416   HCT 43.5 10/21/2017 1416   PLT 220 10/21/2017 1416   MCV 100.7 (H) 10/21/2017 1416   MCH 32.9 10/21/2017 1416   MCHC 32.6 10/21/2017 1416   RDW 14.0 10/21/2017 1416   LYMPHSABS 1.5 03/03/2013 1001   MONOABS 0.4 03/03/2013 1001   EOSABS 0.0 03/03/2013 1001   BASOSABS 0.0 03/03/2013 1001                          BMET    Component Value Date/Time   NA 139 10/21/2017 1416   K 4.3 10/21/2017 1416   CL 104 10/21/2017 1416   CO2 25 10/21/2017 1416   GLUCOSE 82 10/21/2017 1416   BUN 10 10/21/2017 1416   CREATININE 0.74 10/21/2017 1416   CALCIUM 9.2 10/21/2017 1416   GFRNONAA >60 10/21/2017 1416   GFRAA >60 10/21/2017 1416     Assessment/Plan: Patient with low back and right lumbar radicular pain with progressive deformity and impingement at the L4-5 level who is admitted now for 2 level XLIF procedure with percutaneous pedicle screw fixation.  I've discussed with the patient the nature of his condition, the nature the surgical procedure, the typical length of surgery, hospital stay, and overall recuperation, the  limitations postoperatively, and risks of surgery. I discussed risks including risks of infection, bleeding, possibly need for transfusion, the risk of nerve root dysfunction with pain, weakness, numbness, or paresthesias, the risk of dural tear and CSF leakage and possible need for further surgery, the risk of failure of the arthrodesis and possibly for further surgery, the risk of anesthetic complications including myocardial infarction, stroke, pneumonia, and death. We discussed the need for postoperative immobilization in a lumbar brace. Understanding all this the patient does wish to proceed with surgery and is admitted for such.     Hosie Spangle, MD 10/26/2017 7:11 AM

## 2017-10-26 NOTE — Progress Notes (Signed)
Vitals:   10/26/17 1330 10/26/17 1345 10/26/17 1419 10/26/17 1616  BP: 130/67 132/66 136/74 (!) 146/66  Pulse: 74 75 70 (!) 58  Resp: 20 18 18 18   Temp:  (!) 96.8 F (36 C) (!) 97.5 F (36.4 C) 97.6 F (36.4 C)  TempSrc:   Axillary   SpO2: 97% 98% 98% 100%  Weight:        Patient resting comfortably in bed. Significant improvement in right lumbar radicular pain.  Has ambulated in the halls with the staff. Foley to be left in until a.m. because of chronic incontinence.  Lateral incisions clean and dry, but so mild drainage from posterior incisions, dressing applied by nursing staff.  Plan: Encouraged to ambulate. Continue to progress through postoperative recovery.  Hosie Spangle, MD 10/26/2017, 7:19 PM

## 2017-10-26 NOTE — Transfer of Care (Signed)
Immediate Anesthesia Transfer of Care Note  Patient: Teaira C Stewart  Procedure(s) Performed: LUMBAR THREE- LUMBAR FOUR, LUMBAR FOUR- LUMBAR FIVE ANTEROLATERAL LUMBAR INTERBODY ARTHRODESIS (N/A ) LUMBAR THREE- LUMBAR FOUR, LUMBAR FOUR- LUMBAR FIVE PERCUTANEOUS PEDICLE SCREW FIXATION  Patient Location: PACU  Anesthesia Type:General  Level of Consciousness: awake and alert   Airway & Oxygen Therapy: Patient Spontanous Breathing and Patient connected to nasal cannula oxygen  Post-op Assessment: Report given to RN, Post -op Vital signs reviewed and stable and Patient moving all extremities X 4  Post vital signs: Reviewed and stable  Last Vitals:  Vitals:   10/26/17 0600 10/26/17 1205  BP: (!) 152/63 (!) 138/92  Pulse: 66 71  Resp: 18 12  Temp: 36.6 C (!) 36.3 C  SpO2: 96% 98%    Last Pain:  Vitals:   10/26/17 1205  TempSrc:   PainSc: (P) 0-No pain      Patients Stated Pain Goal: 2 (23/95/32 0233)  Complications: No apparent anesthesia complications

## 2017-10-26 NOTE — Anesthesia Procedure Notes (Signed)
Procedure Name: Intubation Date/Time: 10/26/2017 7:47 AM Performed by: Inda Coke, CRNA Pre-anesthesia Checklist: Patient identified, Emergency Drugs available, Suction available and Patient being monitored Patient Re-evaluated:Patient Re-evaluated prior to induction Oxygen Delivery Method: Circle System Utilized Preoxygenation: Pre-oxygenation with 100% oxygen Induction Type: IV induction Ventilation: Mask ventilation without difficulty Laryngoscope Size: Mac and 3 Grade View: Grade I Tube type: Oral Tube size: 7.0 mm Number of attempts: 1 Airway Equipment and Method: Stylet and Oral airway Placement Confirmation: ETT inserted through vocal cords under direct vision,  positive ETCO2 and breath sounds checked- equal and bilateral Secured at: 20 cm Tube secured with: Tape Dental Injury: Teeth and Oropharynx as per pre-operative assessment

## 2017-10-26 NOTE — Op Note (Signed)
10/26/2017  12:45 PM  PATIENT:  [name]  82 y.o. female  PRE-OPERATIVE DIAGNOSIS:  Lumbar stenosis with neurogenic claudication, thoracolumbar degenerative scoliosis, lumbar spondylosis, lumbar degenerative disease, right lumbar radiculopathy  POST-OPERATIVE DIAGNOSIS:  Lumbar stenosis with neurogenic claudication, thoracolumbar degenerative scoliosis, lumbar spondylosis, lumbar degenerative disease, right lumbar radiculopathy  PROCEDURE:  Procedure(s): LUMBAR THREE- LUMBAR FOUR, LUMBAR FOUR- LUMBAR FIVE ANTEROLATERAL LUMBAR INTERBODY ARTHRODESIS LUMBAR THREE- LUMBAR FOUR, LUMBAR FOUR- LUMBAR FIVE PERCUTANEOUS PEDICLE SCREW FIXATION  SURGEON: Jovita Gamma, MD  ASSISTANTS: Ashok Pall, MD  ANESTHESIA:   general  EBL:  Total I/O In: 1800 [I.V.:1800] Out: 1610 [Urine:1025; Blood:50]  BLOOD ADMINISTERED:none  CELL SAVER GIVEN: Insufficient blood loss to process the collected blood  COUNT:  Correct per nursing staff  DICTATION: Patient was brought to the operating room, placed under general endotracheal anesthesia.  Patient was turned to a left side up lateral decubitus position.  The operating table was flexed so as to open the space between the lateral costal margin and the lateral iliac crest.  The C-arm fluoroscope was brought in, and lateral incisions were planned for the L3-4 and L4-5 levels.  The left side of the abdomen the lateral aspect of the torso and the lateral aspect of the back were prepped with Betadine soap and solution and draped in sterile fashion.  Again using the C-arm fluoroscope we planned our incision at the L4-5 level.  The entire anterolateral lumbar interbody arthrodesis was performed with C-arm fluoroscopic guidance in AP and lateral projections.  Electrophysiologic monitoring was used throughout both the anterolateral lumbar interbody arthrodesis as well as the percutaneous pedicle screw fixation.  The skin and subcutaneous tissue were infiltrated  with local anesthetic with epinephrine.  Incision was made carried down to the subcutaneous tissue.  We then made a separate posterior lateral incision, and and with manual blunt dissection dissected into the retroperitoneal space.  The space was expanded, and the lateral margin of the spine identified.  We then entered through the lateral incision into the retroperitoneal space and guided the first dilator down to the lateral aspect of the L4-5 annulus.  Electrophysiologic testing was performed, and then the K wire was passed into the lateral aspect of the disc space.  We then sequentially passed the next 2 dilators, with electrophysiologic testing with each.  The self-retaining retractor system was then assembled and passed over the dilators, and its positioning was confirmed with C-arm fluoroscopic guidance.  We then removed the dilators, secured the self retracting retractor to the disc with a shim.  K wire was then removed.  The self retracting retractor was gradually opened, exposing the lateral aspect of the L4-5 annulus.  Her physiologic testing was again repeated, and then we proceeded with the discectomy.  The annulus was incised, and the disc space entered.  A thorough discectomy was performed using a variety of pituitary rongeurs and stirrup curettes.  We then used a series of interspace sizers, and in the end selected a 12 by 18 x 50 x 10 degree Coroent XL XLIF implant.  The vertebral body endplate surfaces were prepared, and the cartilaginous endplates were removed.  The implant was packed with Vitoss BA and infuse.  The implant was then gently tamped into the intervertebral disc space under C-arm fluoroscopic guidance.  Once it was in good position to be separated from its inserter.  The wound was irrigated with saline solution, good hemostasis confirmed, and then under direct visualization the shin was removed, the self-retaining  retractor was closed down, and removed.  Again using C-arm fluoroscopic  guidance we planned our incision at the L3-4 level. Incision was treated local acetic with epinephrine.  Incision was made and carried down to the subcutaneous tissue.  We used the same posterolateral incision to guide entry into the retroperitoneal space and positioning of the first dilator along the lateral aspect of the L3-4 annulus.  Using the same technique as described above at the L4-5 level, the K wire was passed into the lateral aspect of the disc space, and then a series of dilators was passed over the initial dilator.  Each step was monitored with C-arm fluoroscopy and electrophysiologic testing.  The self-retaining retractor system was reassembled and passed over the dilators, secured to the disc with a shim.  The K wire was removed, and the self-retaining retractor progressively opened, exposing the lateral aspect of the L3-4 annulus.  We again then proceeded with the discectomy at the L3-4 level.  Once the discectomy was completed we again used a series of interspace sizers, and in the and selected a 10 x 18 x 50 x 10 degree Coroent XL XLIF implan.  Vertebral body endplate surfaces were again prepared, with removal of the cartilaginous endplates.  The implant was again packed with Vitoss BA and infuse.  The implant was then gently tamped into the intervertebral disc space under C-arm fluoroscopic guidance.  Once it was well positioned, the inserter was removed.  The wound was again irrigated with saline solution, good hemostasis confirmed, and under direct visualization the shim was removed, the self-retaining retractor close down, and removed.  We then proceeded with wound closure.  Deep fascia was closed with interrupted inverted 2-0 undyed Vicryl sutures and the subcutaneous and subcuticular layer was closed with 3-0 undyed Vicryl sutures, the skin was approximated with Dermabond.  The patient was then turned back to supine position, and then turned back to a prone position for placement of  percutaneous pedicle screw fixation.  The lumbar region was prepped with Betadine soap and solution and draped in a sterile fashion.  Using C-arm fluoroscopic guidance, entry points were identified for the L3, L4, and L5 pedicles bilaterally.  A pair of parasagittal incisions were made in the line of these entry points on each side.  The line of incision was infiltrated local acetic with epinephrine.  A cannulated trocar was passed down through the pedicle into the vertebral body under C-arm fluoroscopic guidance and with electrophysiologic monitoring bilaterally for the L3, L4, and L5 pedicles.  At each pedicle once the trocar was passed down into the vertebral body, the central trocar was removed, and a K wire passed into the vertebral body and the cannulated portion removed.  Once all 6 K wires were in place we measured the depth for each screw, and selected 5.5 x 45 mm self-tapping Reline MAS reduction screws for placement bilaterally at L3 and L4 and 6.5 x 40 mm self-tapping Reline MAS reduction screws for placement bilaterally at L5.  Each screw was passed over the K wire, and gently screwed to the pedicle into the vertebral body under C-arm fluoroscopic guidance.  The K wires were removed.  Once all 6 screws were in place, we selected an 5.5 x 80 mm lordosis rod for the left and a 5.5 x 70 mm lordosis rod for the right.  Each of the rods was passed through the corresponding series of 3 screw heads, locking caps were placed to secure the rod, and then  we sequentially tightened each of the locking caps fully against a counter torque.  Once all 6 locking caps were secured, the rod inserter was removed, the screws superstructure was broken away, and we proceeded with closure.  Scarpa's fascia was closed with interrupted inverted undyed 1 Vicryl suture.  Subcutaneous and subcuticular closed with interrupted inverted 2-0 undyed Vicryl suture.  The skin edges were proximal with Dermabond.  Following surgery the  patient returned back to supine position, reversed the anesthetic, extubated, and transferred to the recovery room for further care.  PLAN OF CARE: Admit to inpatient   PATIENT DISPOSITION:  PACU - hemodynamically stable.   Delay start of Pharmacological VTE agent (>24hrs) due to surgical blood loss or risk of bleeding:  yes

## 2017-10-27 MED ORDER — HYDROCODONE-ACETAMINOPHEN 5-325 MG PO TABS: 1.0000 | ORAL_TABLET | ORAL | 0 refills | Status: DC | PRN

## 2017-10-27 MED FILL — Thrombin For Soln 5000 Unit: CUTANEOUS | Qty: 5000 | Status: AC

## 2017-10-27 NOTE — Progress Notes (Signed)
Patient alert and oriented, mae's well, voiding adequate amount of urine, swallowing without difficulty, c/o mild pain at time of discharge. Patient discharged home with family. Script and discharged instructions given to patient. Patient and family stated understanding of instructions given. Patient has an appointment with Dr. Nudelman.  

## 2017-10-27 NOTE — Discharge Summary (Signed)
Physician Discharge Summary  Patient ID: [name] MRN: [id number] DOB/AGE: 05/08/1936 82 y.o.  Admit date: 10/26/2017 Discharge date: 10/27/2017  Admission Diagnoses:  Lumbar stenosis with neurogenic claudication, thoracolumbar degenerative scoliosis, lumbar spondylosis, lumbar degenerative disease, right lumbar radiculopathy  Discharge Diagnoses:  Lumbar stenosis with neurogenic claudication, thoracolumbar degenerative scoliosis, lumbar spondylosis, lumbar degenerative disease, right lumbar radiculopathy  Active Problems:   Lumbar stenosis with neurogenic claudication   Discharged Condition: good  Hospital Course:  Patient was admitted, underwent a L3-4 and L4-5 XLIF with L3-5 percutaneous pedicle screw fixation. Postoperatively she is done well. She is up and ambulating actively her incisions are healing nicely but there is some moderate ecchymosis around them. There is no drainage.  We have given her instructions regarding wound care and activities following discharge. She is scheduled to follow-up with me in the office with x-rays in 3 weeks.  Discharge Exam: Blood pressure 100/77, pulse 70, temperature 97.8 F (36.6 C), temperature source Axillary, resp. rate 16, weight 73.9 kg (163 lb), last menstrual period 08/11/1974, SpO2 98 %.  Disposition: Discharge disposition: 01-Home or Self Care       Discharge Instructions    Discharge wound care:   Complete by:  As directed    Leave the wound open to air. Shower daily with the wound uncovered. Water and soapy water should run over the incision area. Do not wash directly on the incision for 2 weeks. Remove the glue after 2 weeks.   Driving Restrictions   Complete by:  As directed    No driving for 2 weeks. May ride in the car locally now. May begin to drive locally in 2 weeks.   Other Restrictions   Complete by:  As directed    Walk gradually increasing distances out in the fresh air at least twice a day. Walking additional  6 times inside the house, gradually increasing distances, daily. No bending, lifting, or twisting. Perform activities between shoulder and waist height (that is at counter height when standing or table height when sitting).     Allergies as of 10/27/2017      Reactions   Codeine Nausea And Vomiting   Shingrix [zoster Vac Recomb Adjuvanted] Swelling, Rash   Rash and temp x several days. (low grade 100 -101 )       Medication List    TAKE these medications   acetaminophen 500 MG tablet Commonly known as:  TYLENOL Take 500 mg by mouth daily as needed for mild pain or headache.   aspirin 81 MG tablet Take 81 mg by mouth daily.   clobetasol ointment 0.05 % Commonly known as:  TEMOVATE Apply topically 2 (two) times daily. Do not use for more than 7 days in a row. What changed:    how much to take  when to take this  reasons to take this  additional instructions   COMBIGAN 0.2-0.5 % ophthalmic solution Generic drug:  brimonidine-timolol Place 1 drop into both eyes every 12 (twelve) hours.   ezetimibe-simvastatin 10-20 MG tablet Commonly known as:  VYTORIN Take 1 tablet by mouth daily. What changed:  when to take this   HYDROcodone-acetaminophen 5-325 MG tablet Commonly known as:  NORCO/VICODIN Take 1-2 tablets by mouth every 4 (four) hours as needed (pain).   nadolol 40 MG tablet Commonly known as:  CORGARD take 1 tablet by mouth once daily at bedtime What changed:    how much to take  how to take this  when to take this  omeprazole 20 MG capsule Commonly known as:  PRILOSEC Take 20 mg by mouth daily before breakfast.   sodium chloride 0.65 % Soln nasal spray Commonly known as:  OCEAN Place 1 spray into both nostrils 2 (two) times daily as needed for congestion.   traZODone 100 MG tablet Commonly known as:  DESYREL TAKE 1/4 TO 1/2 TABLET BY MOUTH AT BEDTIME IF NEEDED FOR SLEEP   trimethoprim 100 MG tablet Commonly known as:  TRIMPEX Take 100 mg by  mouth daily.   venlafaxine XR 150 MG 24 hr capsule Commonly known as:  EFFEXOR-XR take 1 capsule by mouth twice a day What changed:    how much to take  how to take this  when to take this            Discharge Care Instructions  (From admission, onward)        Start     Ordered   10/27/17 0000  Discharge wound care:    Comments:  Leave the wound open to air. Shower daily with the wound uncovered. Water and soapy water should run over the incision area. Do not wash directly on the incision for 2 weeks. Remove the glue after 2 weeks.   10/27/17 0849       Signed: Hosie Spangle 10/27/2017, 8:49 AM

## 2017-10-28 MED FILL — Heparin Sodium (Porcine) Inj 1000 Unit/ML: INTRAMUSCULAR | Qty: 30 | Status: AC

## 2017-10-28 MED FILL — Sodium Chloride IV Soln 0.9%: INTRAVENOUS | Qty: 1000 | Status: AC

## 2017-10-30 ENCOUNTER — Observation Stay (HOSPITAL_COMMUNITY)
Admission: EM | Admit: 2017-10-30 | Discharge: 2017-10-31 | Disposition: A | Discharge status: Home / Self Care | Payer: Medicare Other | Attending: Emergency Medicine | Admitting: Emergency Medicine

## 2017-10-30 ENCOUNTER — Encounter (HOSPITAL_COMMUNITY): Payer: Self-pay | Admitting: Neurosurgery

## 2017-10-30 ENCOUNTER — Emergency Department (HOSPITAL_COMMUNITY): Payer: Medicare Other

## 2017-10-30 DIAGNOSIS — K5903 Drug induced constipation: Secondary | ICD-10-CM | POA: Diagnosis not present

## 2017-10-30 DIAGNOSIS — Z79899 Other long term (current) drug therapy: Secondary | ICD-10-CM | POA: Insufficient documentation

## 2017-10-30 DIAGNOSIS — R112 Nausea with vomiting, unspecified: Principal | ICD-10-CM | POA: Diagnosis present

## 2017-10-30 DIAGNOSIS — Z7982 Long term (current) use of aspirin: Secondary | ICD-10-CM | POA: Diagnosis not present

## 2017-10-30 DIAGNOSIS — K567 Ileus, unspecified: Secondary | ICD-10-CM | POA: Insufficient documentation

## 2017-10-30 DIAGNOSIS — G8918 Other acute postprocedural pain: Secondary | ICD-10-CM | POA: Diagnosis not present

## 2017-10-30 DIAGNOSIS — I10 Essential (primary) hypertension: Secondary | ICD-10-CM | POA: Diagnosis not present

## 2017-10-30 DIAGNOSIS — E785 Hyperlipidemia, unspecified: Secondary | ICD-10-CM | POA: Diagnosis present

## 2017-10-30 LAB — COMPREHENSIVE METABOLIC PANEL
ALK PHOS: 64 U/L (ref 38–126)
ALT: 28 U/L (ref 14–54)
ANION GAP: 11 (ref 5–15)
AST: 40 U/L (ref 15–41)
Albumin: 3.3 g/dL — ABNORMAL LOW (ref 3.5–5.0)
BILIRUBIN TOTAL: 1.1 mg/dL (ref 0.3–1.2)
BUN: 9 mg/dL (ref 6–20)
CALCIUM: 9.3 mg/dL (ref 8.9–10.3)
CO2: 27 mmol/L (ref 22–32)
CREATININE: 0.6 mg/dL (ref 0.44–1.00)
Chloride: 100 mmol/L — ABNORMAL LOW (ref 101–111)
GFR calc non Af Amer: >60 mL/min (ref >60)
Glucose, Bld: 109 mg/dL — ABNORMAL HIGH (ref 65–99)
Potassium: 3.8 mmol/L (ref 3.5–5.1)
Sodium: 138 mmol/L (ref 135–145)
TOTAL PROTEIN: 6.9 g/dL (ref 6.5–8.1)

## 2017-10-30 LAB — CBC
HCT: 40.8 % (ref 36.0–46.0)
HEMOGLOBIN: 13.2 g/dL (ref 12.0–15.0)
MCH: 32.3 pg (ref 26.0–34.0)
MCHC: 32.4 g/dL (ref 30.0–36.0)
MCV: 99.8 fL (ref 78.0–100.0)
PLATELETS: 262 10*3/uL (ref 150–400)
RBC: 4.09 MIL/uL (ref 3.87–5.11)
RDW: 14 % (ref 11.5–15.5)
WBC: 8.7 10*3/uL (ref 4.0–10.5)

## 2017-10-30 LAB — LIPASE, BLOOD: Lipase: 22 U/L (ref 11–51)

## 2017-10-30 MED ORDER — SODIUM CHLORIDE 0.9 % IV BOLUS (SEPSIS)
500.0000 mL | Freq: Once | INTRAVENOUS | Status: AC
  Administered 2017-10-31: 500 mL via INTRAVENOUS

## 2017-10-30 MED ORDER — MORPHINE SULFATE (PF) 4 MG/ML IV SOLN
4.0000 mg | Freq: Once | INTRAVENOUS | Status: AC
  Administered 2017-10-31: 4 mg via INTRAVENOUS
  Filled 2017-10-30: qty 1

## 2017-10-30 MED ORDER — ONDANSETRON 4 MG PO TBDP
4.0000 mg | ORAL_TABLET | Freq: Once | ORAL | Status: AC | PRN
  Administered 2017-10-30: 4 mg via ORAL
  Filled 2017-10-30: qty 1

## 2017-10-30 NOTE — ED Notes (Signed)
Provider at bedside

## 2017-10-30 NOTE — ED Provider Notes (Signed)
Patient placed in Quick Look pathway, seen and evaluated   Chief Complaint: N/V  HPI:   82 year old female who presents for nausea and vomiting.  Patient is status post L3-4 and 5 lumbar arthrodesis performed by Dr. Sherwood Gambler on 10/26/2017.  She has been taking pain medication states that she has not made many bowel movements since then.  This morning she awoke with onset of severe nausea and intractable vomiting.  She feels weak.  She has a slight cough.  She has been urinating a lot.  She denies fever chills or abdominal pain.  Denies chest pain or shortness of breath  ROS: Nausea and vomiting (one)  Physical Exam:   Gen: No distress  Neuro: Awake and Alert  Skin: Warm    Focused Exam: Patient appears ill, weak, actively vomiting and coughing.  Abdominal exam is benign without any focal tenderness.  Lumbar incision sites appear well healing without any infection or severe tenderness.  There is marked bruising around the incision site   Initiation of care has begun. The patient has been counseled on the process, plan, and necessity for staying for the completion/evaluation, and the remainder of the medical screening examination    Ned Grace 10/30/17 1939    Lacretia Leigh, MD 10/31/17 2214

## 2017-10-30 NOTE — ED Provider Notes (Signed)
[facility] EMERGENCY DEPARTMENT Provider Note   CSN: [id number] Arrival date & time: 10/30/17  1924     History   Chief Complaint Chief Complaint  Patient presents with  . Influenza  . Emesis    HPI [name] is a 82 y.o. female with a hx of depression, HTN, GERD, stress incontinence presents to the Emergency Department complaining of gradual, persistent, progressively worsening N/V onset this AM. Pt has a lumbar spinal fusion with a posterior lateral approach on 10/26/17 by Dr. Sherwood Gambler (underwent a L3-4 and L4-5 XLIF with L3-5 percutaneous pedicle screw fixation) and was discharged on 10/27/17 after normal ambulation and felt well.  Pt was normal on 3/20-21.  This morning pt awoke with nausea, vomiting and unable to tolerate liquids.  Pt has had 1 small and hard BM this afternoon with straining.  Patient reports she has not had a bowel movement since the surgery.  Pt reports taking stool softeners with increased nausea.  She attempted Mag citrate and then began vomiting.  Pt was unable to take her Vicodin today and thus has increased her back pain. Pt with a hx of appendectomy, cholecystectomy and hysterectomy.  Pt denies fever, chills, headache, neck pain, chest pain, SOB, cough, congestion.  Pt does endorse irritation of her throat from vomiting.  Laying on her left side gives her the most relief, though this is the site of her incision.  Unknown if she is able to pass gas.      The history is provided by the patient and medical records. No language interpreter was used.    Past Medical History:  Diagnosis Date  . Anxiety   . Colon polyps   . Complication of anesthesia    takes longer to wake up- admitted overnight after colonoscopy  . Depression   . GERD (gastroesophageal reflux disease)   . Glaucoma   . Headache    migraines  . Hx: UTI (urinary tract infection)   . Hypercholesteremia   . Hypertension   . Lichen simplex chronicus   . Lumbar  stenosis with neurogenic claudication   . Macular degeneration   . Osteoarthritis   . Osteopenia    of the hip  . PONV (postoperative nausea and vomiting)   . Stress incontinence, female   . Unsteady gait   . Urinary urgency   . Wears glasses   . Whooping cough    as a baby    Patient Active Problem List   Diagnosis Date Noted  . Nausea & vomiting 10/31/2017  . Chronic daily headache 07/08/2017  . Memory changes 03/06/2017  . Vaginal itching 04/04/2016  . Lumbar stenosis with neurogenic claudication 03/20/2016  . Scalp pain 10/22/2015  . Post-traumatic headache 10/11/2015  . Routine health maintenance 03/06/2013  . Macular degeneration of right eye 03/03/2013  . Glaucoma 03/03/2013  . Hyperlipidemia 03/14/2009  . Depression 10/19/2007  . Essential hypertension 10/19/2007  . GASTROESOPHAGEAL REFLUX DISEASE 10/19/2007  . Osteoarthritis 10/19/2007  . STRESS INCONTINENCE 10/19/2007  . Allergic state 10/19/2007    Past Surgical History:  Procedure Laterality Date  . ABDOMINAL HYSTERECTOMY     ovaries remain  . ANTERIOR LAT LUMBAR FUSION N/A 10/26/2017   Procedure: LUMBAR THREE- LUMBAR FOUR, LUMBAR FOUR- LUMBAR FIVE ANTEROLATERAL LUMBAR INTERBODY ARTHRODESIS;  Surgeon: Jovita Gamma, MD;  Location: [facility];  Service: Neurosurgery;  Laterality: N/A;  LUMBAR 3- LUMBAR 4, LUMBAR 4- LUMBAR 5 ANTEROLATERAL LUMBAR INTERBODY ARTHRODESIS, LUMBAR 3- LUMBAR 4, LUMBAR 4- LUMBAR  5 PERCUTANEOUS PEDICLE SCREW FIXATION  . APPENDECTOMY    . BLADDER REPAIR     x two  . CHOLECYSTECTOMY    . COLONOSCOPY    . EYE SURGERY  11/14, 12/14   Macular Dengeneration, Glaucoma  . EYE SURGERY  2016   left eye  . LESION REMOVAL Right 06/28/2013   Procedure: EXCISION 3 cm right labial sebaceous cyst;  Surgeon: Lyman Speller, MD;  Location: [facility];  Service: Gynecology;  Laterality: Right;  . LUMBAR LAMINECTOMY/DECOMPRESSION MICRODISCECTOMY Right 03/20/2016   Procedure: Right - Lumbar four-five  lumbar laminotomy, foraminotomy, and possible microdiscectomy;  Surgeon: Jovita Gamma, MD;  Location: [facility];  Service: Neurosurgery;  Laterality: Right;  right  . LUMBAR PERCUTANEOUS PEDICLE SCREW 2 LEVEL  10/26/2017   Procedure: LUMBAR THREE- LUMBAR FOUR, LUMBAR FOUR- LUMBAR FIVE PERCUTANEOUS PEDICLE SCREW FIXATION;  Surgeon: Jovita Gamma, MD;  Location: [facility];  Service: Neurosurgery;;  . MOHS SURGERY     procedure to remove basal cell  . TONSILLECTOMY AND ADENOIDECTOMY    . URETHRAL SLING  2007  . URETHRAL SLING  1/12   midurethral   . vertebroplasty secondary to traumatic compression fracture       OB History    Gravida  4   Para  2   Term      Preterm      AB      Living  2     SAB      TAB      Ectopic      Multiple      Live Births               Home Medications    Prior to Admission medications   Medication Sig Start Date End Date Taking? Authorizing Provider  acetaminophen (TYLENOL) 500 MG tablet Take 500 mg by mouth daily as needed for mild pain or headache.    Yes [provider]  brimonidine-timolol (COMBIGAN) 0.2-0.5 % ophthalmic solution Place 1 drop into both eyes every 12 (twelve) hours.   Yes [provider]  clobetasol ointment (TEMOVATE) 0.05 % Apply topically 2 (two) times daily. Do not use for more than 7 days in a row. Patient taking differently: Apply 1 application topically daily as needed (rash). Do not use for more than 7 days in a row. 03/26/17  Yes Megan Salon, MD  ezetimibe-simvastatin (VYTORIN) 10-20 MG tablet Take 1 tablet by mouth daily. Patient taking differently: Take 1 tablet by mouth daily at 6 PM.  10/10/17  Yes Marin Olp, MD  HYDROcodone-acetaminophen (NORCO/VICODIN) 5-325 MG tablet Take 1-2 tablets by mouth every 4 (four) hours as needed (pain). 10/27/17  Yes Jovita Gamma, MD  magnesium citrate SOLN Take 195 mLs by mouth once.   Yes [provider]  nadolol (CORGARD) 40 MG  tablet take 1 tablet by mouth once daily at bedtime Patient taking differently: take 1 tablet (40mg ) by mouth once daily at bedtime 07/16/17  Yes Marin Olp, MD  sodium chloride (OCEAN) 0.65 % SOLN nasal spray Place 1 spray into both nostrils 2 (two) times daily as needed for congestion.   Yes [provider]  traZODone (DESYREL) 100 MG tablet TAKE 1/4 TO 1/2 TABLET BY MOUTH AT BEDTIME IF NEEDED FOR SLEEP Patient taking differently: TAKE 1/4 TABLET (25 MG) BY MOUTH DAILY AT BEDTIME 10/20/17  Yes Marin Olp, MD  venlafaxine XR (EFFEXOR-XR) 150 MG 24 hr capsule take 1 capsule by  mouth twice a day Patient taking differently: take 1 capsule (150mg ) by mouth twice a day 08/03/17  Yes Marin Olp, MD  aspirin EC 81 MG tablet Take 81 mg by mouth daily.    [provider]  omeprazole (PRILOSEC) 20 MG capsule Take 20 mg by mouth daily before breakfast.    [provider]    Family History Family History  Problem Relation Age of Onset  . Congestive Heart Failure Mother   . Thyroid disease Mother   . Osteoporosis Mother   . Alcoholism Mother   . Prostate cancer Father   . Breast cancer Maternal Grandmother   . Hypertension Sister   . Thyroid disease Sister   . Cancer Sister        brain ?  Marland Kitchen Rheum arthritis Daughter     Social History Social History   Tobacco Use  . Smoking status: Former Smoker    Last attempt to quit: 08/11/1965    Years since quitting: 52.2  . Smokeless tobacco: Never Used  . Tobacco comment: has not smoked in 45 years   Substance Use Topics  . Alcohol use: Yes    Alcohol/week: 0.6 oz    Types: 1 Standard drinks or equivalent per week    Comment: occ   . Drug use: No     Allergies   Codeine and Shingrix [zoster vac recomb adjuvanted]   Review of Systems Review of Systems  Constitutional: Positive for fever. Negative for appetite change, diaphoresis, fatigue and unexpected weight change.  HENT: Negative for mouth  sores.   Eyes: Negative for visual disturbance.  Respiratory: Negative for cough, chest tightness, shortness of breath and wheezing.   Cardiovascular: Negative for chest pain.  Gastrointestinal: Positive for abdominal pain, nausea and vomiting. Negative for constipation and diarrhea.  Endocrine: Negative for polydipsia, polyphagia and polyuria.  Genitourinary: Negative for dysuria, frequency, hematuria and urgency.  Musculoskeletal: Positive for back pain. Negative for neck stiffness.  Skin: Negative for rash.  Allergic/Immunologic: Negative for immunocompromised state.  Neurological: Negative for syncope, light-headedness and headaches.  Hematological: Does not bruise/bleed easily.  Psychiatric/Behavioral: Negative for sleep disturbance. The patient is not nervous/anxious.      Physical Exam Updated Vital Signs BP (!) 148/66   Pulse 67   Temp (!) 97.5 F (36.4 C) (Oral)   Resp 18   LMP 08/11/1974   SpO2 100%   Physical Exam  Constitutional: She appears well-developed and well-nourished. She appears distressed.  Awake, alert Pt actively dry heaving  HENT:  Head: Normocephalic and atraumatic.  Mouth/Throat: Oropharynx is clear and moist. Mucous membranes are dry. No oropharyngeal exudate.  Eyes: Conjunctivae are normal. No scleral icterus.  Neck: Normal range of motion. Neck supple.  Cardiovascular: Normal rate, regular rhythm and intact distal pulses.  Pulmonary/Chest: Effort normal and breath sounds normal. No respiratory distress. She has no wheezes.  Equal chest expansion  Abdominal: Soft. Bowel sounds are normal. She exhibits no mass. There is tenderness in the left lower quadrant. There is no rigidity, no rebound, no guarding and no CVA tenderness.  Genitourinary:  Genitourinary Comments: Several nonthrombosed hemorrhoids.  No rectal bleeding.  Musculoskeletal: Normal range of motion. She exhibits no edema.  Neurological: She is alert.  Speech is clear and goal  oriented Moves extremities without ataxia  Skin: Skin is warm and dry. Capillary refill takes less than 2 seconds. She is not diaphoretic.  New posterior surgical incisions with surrounding ecchymosis 1 left lateral surgical incision with  significant ecchymosis extending into the left lower quadrant of the abdomen.  Tenderness throughout.  No increased warmth or purulent drainage.  Psychiatric: She has a normal mood and affect.  Nursing note and vitals reviewed.    ED Treatments / Results  Labs (all labs ordered are listed, but only abnormal results are displayed) Labs Reviewed  COMPREHENSIVE METABOLIC PANEL - Abnormal; Notable for the following components:      Result Value   Chloride 100 (*)    Glucose, Bld 109 (*)    Albumin 3.3 (*)    All other components within normal limits  URINALYSIS, ROUTINE W REFLEX MICROSCOPIC - Abnormal; Notable for the following components:   Color, Urine STRAW (*)    pH 9.0 (*)    Ketones, ur 20 (*)    All other components within normal limits  LIPASE, BLOOD  CBC  INFLUENZA PANEL BY PCR (TYPE A & B)  CBC  CREATININE, SERUM  I-STAT TROPONIN, ED     EKG Interpretation  Date/Time:  Saturday October 31 2017 00:23:07 EDT Ventricular Rate:  75 PR Interval:    QRS Duration: 75 QT Interval:  400 QTC Calculation: 447 R Axis:   5 Text Interpretation:  Sinus rhythm Low voltage, precordial leads No significant change was found Confirmed by Jola Schmidt [phone]) on 10/31/2017 12:31:30 AM        Radiology Dg Chest 2 View  Result Date: 10/30/2017 CLINICAL DATA:  Slightly productive cough with phlegm. Emesis. Patient had lumbar spine surgery on Monday of this week. EXAM: CHEST - 2 VIEW COMPARISON:  CXR report 11/18/2001, lumbar spine radiographs from 10/13/2017 FINDINGS: The heart size and mediastinal contours are within normal limits. Elevated right hemidiaphragm as previously described in 2003. Both lungs are clear. The visualized skeletal structures  are unremarkable. L1 kyphoplasty. IMPRESSION: No active cardiopulmonary disease. Electronically Signed   By: Ashley Royalty M.D.   On: 10/30/2017 21:00   Dg Lumbar Spine Complete  Result Date: 10/30/2017 CLINICAL DATA:  Pain.  Anterior lumbar fusion. EXAM: LUMBAR SPINE - COMPLETE 4+ VIEW COMPARISON:  10/26/2017 intraoperative radiographs, 10/13/2017 lumbar spine radiographs. FINDINGS: Spinal fusion hardware noted from L3 through S1 with interbody blocks in place. Kyphoplasty at L1. No apparent complications. No hardware failure. No new fracture is seen. IMPRESSION: 1. Status post anterior lumbar interbody fusion from L3 through L5 with hardware in place. 2. Status post remote L1 kyphoplasty. Electronically Signed   By: Ashley Royalty M.D.   On: 10/30/2017 21:03   Ct Abdomen Pelvis W Contrast  Result Date: 10/31/2017 CLINICAL DATA:  Abdominal distension. Nausea and vomiting. Symptoms since lumbar spine surgery 5 days ago. EXAM: CT ABDOMEN AND PELVIS WITH CONTRAST TECHNIQUE: Multidetector CT imaging of the abdomen and pelvis was performed using the standard protocol following bolus administration of intravenous contrast. CONTRAST:  100 cc ISOVUE-300 IOPAMIDOL (ISOVUE-300) INJECTION 61% COMPARISON:  None. FINDINGS: Lower chest: Elevated right hemidiaphragm with adjacent basilar atelectasis. Mild hypoventilatory change at the lung bases. Hepatobiliary: 3.4 cm subcapsular lesion in the right dome of the liver. 16 mm subcapsular lesion more inferiorly with fill in on delayed phase imaging suggest hemangioma. There is a similar low-density lesion at the inferior liver tip. Clips in the gallbladder fossa postcholecystectomy. No biliary dilatation. Pancreas: No ductal dilatation or inflammation. Spleen: Normal in size without focal abnormality. Adrenals/Urinary Tract: Normal adrenal glands. No hydronephrosis or perinephric edema. Homogeneous renal enhancement with symmetric excretion on delayed phase imaging. Tiny cortical  cysts in the right kidney.  Urinary bladder is physiologically distended without wall thickening. Air in the bladder likely secondary to recent catheterization. Stomach/Bowel: Stomach physiologically distended. No bowel wall thickening, obstruction or inflammation. Appendix not visualized, patient with history of appendectomy. Moderate stool burden throughout the colon without colonic wall thickening. Mild rectal distention with stool of 5 cm. Mild distal diverticulosis without diverticulitis. Vascular/Lymphatic: Aorto bi-iliac atherosclerosis. No enlarged abdominal or pelvic lymph nodes. Reproductive: Post hysterectomy. Both ovaries are normal and quiescent. Other: Left lumbar hernia with mild stranding, likely postsurgical. Minimal air in stranding about the bilateral psoas muscles is also likely postsurgical. No evidence of focal fluid collection. No ascites or free fluid in the abdomen or pelvis. No free air. Musculoskeletal: Posterior L3 through L5 fusion with interbody spacers. The hardware is intact. No evidence of postoperative collection. Minimal air in the subcutaneous tissues about the left lateral abdominal wall, normal postop. L1 kyphoplasty. IMPRESSION: 1. Moderate colonic stool burden with rectal distention, suggesting postoperative constipation. No bowel obstruction or inflammation. 2. Recent postsurgical change in the lumbar spine with expected postsurgical stranding and air. Small left lumbar hernia without bowel involvement. 3. Hypodense liver lesions, 2 of which likely represent hemangiomas. An additional 3.4 cm lesion in the right hepatic dome does not have typical characteristics of hemangioma may be a complex cyst. No prior exams available for comparison. Recommend further characterization with elective MRI when patient is able tolerate breath hold technique. 4. Incidental colonic diverticulosis. Aortic Atherosclerosis (ICD10-I70.0). Electronically Signed   By: Jeb Levering M.D.   On:  10/31/2017 01:46    Procedures Procedures (including critical care time)  Medications Ordered in ED Medications  brimonidine-timolol (COMBIGAN) 0.2-0.5 % ophthalmic solution 1 drop (has no administration in time range)  ezetimibe-simvastatin (VYTORIN) 10-20 MG per tablet 1 tablet (has no administration in time range)  nadolol (CORGARD) tablet 40 mg (has no administration in time range)  pantoprazole (PROTONIX) EC tablet 40 mg (has no administration in time range)  sodium chloride (OCEAN) 0.65 % nasal spray 1 spray (has no administration in time range)  traZODone (DESYREL) tablet 50 mg (has no administration in time range)  acetaminophen (TYLENOL) tablet 650 mg (has no administration in time range)    Or  acetaminophen (TYLENOL) suppository 650 mg (has no administration in time range)  ondansetron (ZOFRAN) tablet 4 mg (has no administration in time range)    Or  ondansetron (ZOFRAN) injection 4 mg (has no administration in time range)  enoxaparin (LOVENOX) injection 40 mg (has no administration in time range)  dextrose 5 %-0.9 % sodium chloride infusion (has no administration in time range)  bisacodyl (DULCOLAX) suppository 10 mg (has no administration in time range)  ondansetron (ZOFRAN-ODT) disintegrating tablet 4 mg (4 mg Oral Given 10/30/17 1935)  sodium chloride 0.9 % bolus 500 mL (0 mLs Intravenous Stopped 10/31/17 0150)  morphine 4 MG/ML injection 4 mg (4 mg Intravenous Given 10/31/17 0013)  iopamidol (ISOVUE-300) 61 % injection (100 mLs  Contrast Given 10/31/17 0035)  ondansetron (ZOFRAN) injection 4 mg (4 mg Intravenous Given 10/31/17 0143)  milk and molasses enema (250 mLs Rectal Given 10/31/17 0425)  morphine 4 MG/ML injection 4 mg (4 mg Intravenous Given 10/31/17 0353)  promethazine (PHENERGAN) injection 12.5 mg (12.5 mg Intravenous Given 10/31/17 0440)     Initial Impression / Assessment and Plan / ED Course  I have reviewed the triage vital signs and the nursing  notes.  Pertinent labs & imaging results that were available during my care of the patient  were reviewed by me and considered in my medical decision making (see chart for details).     Patient presents to the emergency department with worsening abdominal pain and persistent vomiting after surgery this week.  Ecchymosis to her back and flank lead to concerns for possible postop complications.  She is afebrile.  No bloody emesis.  Patient with worsening back pain however I suspect this is secondary to her inability to take her home pain medications.  CT scan without acute abnormality.  Constipation is noted.  Plain films of the L-spine show lumbar interbody fusion from L3-L5 with hardware in place.  Chest x-ray without pneumonia, pulmonary edema or pneumothorax.  I personally evaluated these images.  Patient given enema here in the emergency department with large volume bowel movement.  She reports some improvement in her abdominal pain however she has continued to vomit despite multiple doses of antiemetic and bowel movement.  She will need to be admitted for intractable vomiting.  The patient was discussed with and seen by Dr. Venora Maples who agrees with the treatment plan.    Final Clinical Impressions(s) / ED Diagnoses   Final diagnoses:  Intractable vomiting with nausea, unspecified vomiting type  Post-op pain  Drug-induced constipation    ED Discharge Orders    None       Loni Muse Gwenlyn Perking 10/31/17 [id number]    Jola Schmidt, MD 10/31/17 [phone]

## 2017-10-30 NOTE — ED Triage Notes (Signed)
Pt presents from home with n/v, chills, body aches, and 'generally feeling unwell" since yesterday; pt recently here with back surg by [facility]; states she has been taking her pain meds without food, small BM this morning which she noted was normal

## 2017-10-31 ENCOUNTER — Encounter (HOSPITAL_COMMUNITY): Payer: Self-pay | Admitting: Radiology

## 2017-10-31 ENCOUNTER — Other Ambulatory Visit: Payer: Self-pay

## 2017-10-31 ENCOUNTER — Emergency Department (HOSPITAL_COMMUNITY): Payer: Medicare Other

## 2017-10-31 DIAGNOSIS — R112 Nausea with vomiting, unspecified: Secondary | ICD-10-CM | POA: Diagnosis present

## 2017-10-31 DIAGNOSIS — I10 Essential (primary) hypertension: Secondary | ICD-10-CM

## 2017-10-31 DIAGNOSIS — E785 Hyperlipidemia, unspecified: Secondary | ICD-10-CM

## 2017-10-31 LAB — I-STAT TROPONIN, ED: Troponin i, poc: 0 ng/mL (ref 0.00–0.08)

## 2017-10-31 LAB — URINALYSIS, ROUTINE W REFLEX MICROSCOPIC
BILIRUBIN URINE: NEGATIVE
GLUCOSE, UA: NEGATIVE mg/dL
HGB URINE DIPSTICK: NEGATIVE
KETONES UR: 20 mg/dL — AB
Leukocytes, UA: NEGATIVE
Nitrite: NEGATIVE
PH: 9 — AB (ref 5.0–8.0)
Protein, ur: NEGATIVE mg/dL
Specific Gravity, Urine: 1.026 (ref 1.005–1.030)

## 2017-10-31 LAB — INFLUENZA PANEL BY PCR (TYPE A & B)
Influenza A By PCR: NEGATIVE
Influenza B By PCR: NEGATIVE

## 2017-10-31 MED ORDER — ACETAMINOPHEN 325 MG PO TABS: 650.0000 mg | ORAL_TABLET | Freq: Four times a day (QID) | ORAL | Status: DC | PRN

## 2017-10-31 MED ORDER — MILK AND MOLASSES ENEMA
1.0000 | Freq: Once | RECTAL | Status: AC
  Administered 2017-10-31: 250 mL via RECTAL
  Filled 2017-10-31: qty 250

## 2017-10-31 MED ORDER — ONDANSETRON HCL 4 MG/2ML IJ SOLN
4.0000 mg | Freq: Once | INTRAMUSCULAR | Status: AC
  Administered 2017-10-31: 4 mg via INTRAVENOUS
  Filled 2017-10-31: qty 2

## 2017-10-31 MED ORDER — ENOXAPARIN SODIUM 40 MG/0.4ML ~~LOC~~ SOLN
40.0000 mg | SUBCUTANEOUS | Status: DC
  Administered 2017-10-31: 40 mg via SUBCUTANEOUS
  Filled 2017-10-31: qty 0.4

## 2017-10-31 MED ORDER — ONDANSETRON HCL 4 MG PO TABS: 4.0000 mg | ORAL_TABLET | Freq: Four times a day (QID) | ORAL | 0 refills | Status: DC | PRN

## 2017-10-31 MED ORDER — ONDANSETRON HCL 4 MG/2ML IJ SOLN: 4.0000 mg | Freq: Four times a day (QID) | INTRAMUSCULAR | Status: DC | PRN

## 2017-10-31 MED ORDER — PANTOPRAZOLE SODIUM 40 MG PO TBEC
40.0000 mg | DELAYED_RELEASE_TABLET | Freq: Every day | ORAL | Status: DC
  Administered 2017-10-31: 40 mg via ORAL
  Filled 2017-10-31: qty 1

## 2017-10-31 MED ORDER — TRAZODONE HCL 50 MG PO TABS
50.0000 mg | ORAL_TABLET | Freq: Every evening | ORAL | Status: DC | PRN
Start: 2017-10-31 — End: 2017-10-31

## 2017-10-31 MED ORDER — NADOLOL 40 MG PO TABS: 40.0000 mg | ORAL_TABLET | Freq: Every day | ORAL | Status: DC

## 2017-10-31 MED ORDER — BRIMONIDINE TARTRATE-TIMOLOL 0.2-0.5 % OP SOLN: 1.0000 [drp] | Freq: Two times a day (BID) | OPHTHALMIC | Status: DC

## 2017-10-31 MED ORDER — IOPAMIDOL (ISOVUE-300) INJECTION 61%
INTRAVENOUS | Status: AC
  Administered 2017-10-31: 100 mL
  Filled 2017-10-31: qty 100

## 2017-10-31 MED ORDER — SALINE SPRAY 0.65 % NA SOLN: 1.0000 | Freq: Two times a day (BID) | NASAL | Status: DC | PRN

## 2017-10-31 MED ORDER — DEXTROSE-NACL 5-0.9 % IV SOLN: INTRAVENOUS | Status: DC

## 2017-10-31 MED ORDER — TIMOLOL MALEATE 0.5 % OP SOLN
1.0000 [drp] | Freq: Two times a day (BID) | OPHTHALMIC | Status: DC
  Administered 2017-10-31: 1 [drp] via OPHTHALMIC
  Filled 2017-10-31: qty 5

## 2017-10-31 MED ORDER — ONDANSETRON HCL 4 MG PO TABS: 4.0000 mg | ORAL_TABLET | Freq: Four times a day (QID) | ORAL | Status: DC | PRN

## 2017-10-31 MED ORDER — PROMETHAZINE HCL 25 MG/ML IJ SOLN
12.5000 mg | Freq: Once | INTRAMUSCULAR | Status: AC
  Administered 2017-10-31: 12.5 mg via INTRAVENOUS
  Filled 2017-10-31: qty 1

## 2017-10-31 MED ORDER — BISACODYL 10 MG RE SUPP: 10.0000 mg | Freq: Once | RECTAL | Status: DC

## 2017-10-31 MED ORDER — ACETAMINOPHEN 650 MG RE SUPP: 650.0000 mg | Freq: Four times a day (QID) | RECTAL | Status: DC | PRN

## 2017-10-31 MED ORDER — BRIMONIDINE TARTRATE 0.2 % OP SOLN
1.0000 [drp] | Freq: Two times a day (BID) | OPHTHALMIC | Status: DC
  Administered 2017-10-31: 1 [drp] via OPHTHALMIC
  Filled 2017-10-31: qty 5

## 2017-10-31 MED ORDER — EZETIMIBE-SIMVASTATIN 10-20 MG PO TABS
1.0000 | ORAL_TABLET | Freq: Every day | ORAL | Status: DC
  Filled 2017-10-31: qty 1

## 2017-10-31 MED ORDER — MORPHINE SULFATE (PF) 4 MG/ML IV SOLN
4.0000 mg | Freq: Once | INTRAVENOUS | Status: AC
  Administered 2017-10-31: 4 mg via INTRAVENOUS
  Filled 2017-10-31: qty 1

## 2017-10-31 NOTE — ED Notes (Signed)
Patient transported to CT 

## 2017-10-31 NOTE — Plan of Care (Signed)
  Problem: Elimination: Goal: Will not experience complications related to bowel motility Outcome: Progressing Note:  Pt has had bowel movements this shift

## 2017-10-31 NOTE — H&P (Signed)
History and Physical    [name] BMW:[id number] DOB: 01/10/1936 DOA: 10/30/2017  PCP: Marin Olp, MD  Patient coming from: Home.  Chief Complaint: Nausea vomiting.  HPI: [name] is a 82 y.o. female with history of hypertension, hyperlipidemia who had recent surgery for lumbar stenosis and discharged on October 27, 2017 3 days ago presents to the ER with persistent vomiting since yesterday morning.  Patient has been taking pain medications hydrocodone for last couple of days.  Denies any abdominal pain.  Patient's husband had called up the neurosurgical office after patient had persistent vomiting and was told to take laxative and Fleet enema.  Patient took over-the-counter laxatives along which patient's vomiting worsened.  Had one small bowel movement.  Prior to that patient's last bowel movement was 1 week ago.  Due to persistent vomiting patient came to the ER.  Does complain of some pain around the left groin area.  ED Course: In the ER CT abdomen pelvis done shows stool burden in the colon.  Patient was given 1 dose of enema and the results are still awaited.  On exam patient has few bowel movements.  Abdomen is soft.  Patient admitted for further management of persistent nausea vomiting likely from developing ileus.  Review of Systems: As per HPI, rest all negative.   Past Medical History:  Diagnosis Date  . Anxiety   . Colon polyps   . Complication of anesthesia    takes longer to wake up- admitted overnight after colonoscopy  . Depression   . GERD (gastroesophageal reflux disease)   . Glaucoma   . Headache    migraines  . Hx: UTI (urinary tract infection)   . Hypercholesteremia   . Hypertension   . Lichen simplex chronicus   . Lumbar stenosis with neurogenic claudication   . Macular degeneration   . Osteoarthritis   . Osteopenia    of the hip  . PONV (postoperative nausea and vomiting)   . Stress incontinence, female   . Unsteady gait   . Urinary  urgency   . Wears glasses   . Whooping cough    as a baby    Past Surgical History:  Procedure Laterality Date  . ABDOMINAL HYSTERECTOMY     ovaries remain  . ANTERIOR LAT LUMBAR FUSION N/A 10/26/2017   Procedure: LUMBAR THREE- LUMBAR FOUR, LUMBAR FOUR- LUMBAR FIVE ANTEROLATERAL LUMBAR INTERBODY ARTHRODESIS;  Surgeon: Jovita Gamma, MD;  Location: [facility];  Service: Neurosurgery;  Laterality: N/A;  LUMBAR 3- LUMBAR 4, LUMBAR 4- LUMBAR 5 ANTEROLATERAL LUMBAR INTERBODY ARTHRODESIS, LUMBAR 3- LUMBAR 4, LUMBAR 4- LUMBAR 5 PERCUTANEOUS PEDICLE SCREW FIXATION  . APPENDECTOMY    . BLADDER REPAIR     x two  . CHOLECYSTECTOMY    . COLONOSCOPY    . EYE SURGERY  11/14, 12/14   Macular Dengeneration, Glaucoma  . EYE SURGERY  2016   left eye  . LESION REMOVAL Right 06/28/2013   Procedure: EXCISION 3 cm right labial sebaceous cyst;  Surgeon: Lyman Speller, MD;  Location: [facility];  Service: Gynecology;  Laterality: Right;  . LUMBAR LAMINECTOMY/DECOMPRESSION MICRODISCECTOMY Right 03/20/2016   Procedure: Right - Lumbar four-five lumbar laminotomy, foraminotomy, and possible microdiscectomy;  Surgeon: Jovita Gamma, MD;  Location: [facility];  Service: Neurosurgery;  Laterality: Right;  right  . LUMBAR PERCUTANEOUS PEDICLE SCREW 2 LEVEL  10/26/2017   Procedure: LUMBAR THREE- LUMBAR FOUR, LUMBAR FOUR- LUMBAR FIVE PERCUTANEOUS PEDICLE SCREW FIXATION;  Surgeon: Sherwood Gambler,  Herbie Baltimore, MD;  Location: [facility];  Service: Neurosurgery;;  . MOHS SURGERY     procedure to remove basal cell  . TONSILLECTOMY AND ADENOIDECTOMY    . URETHRAL SLING  2007  . URETHRAL SLING  1/12   midurethral   . vertebroplasty secondary to traumatic compression fracture       reports that she quit smoking about 52 years ago. She has never used smokeless tobacco. She reports that she drinks about 0.6 oz of alcohol per week. She reports that she does not use drugs.  Allergies  Allergen Reactions  . Codeine Nausea And Vomiting    . Shingrix [Zoster Vac Recomb Adjuvanted] Swelling and Rash    Rash and temp x several days. (low grade 100 -101 )     Family History  Problem Relation Age of Onset  . Congestive Heart Failure Mother   . Thyroid disease Mother   . Osteoporosis Mother   . Alcoholism Mother   . Prostate cancer Father   . Breast cancer Maternal Grandmother   . Hypertension Sister   . Thyroid disease Sister   . Cancer Sister        brain ?  Marland Kitchen Rheum arthritis Daughter     Prior to Admission medications   Medication Sig Start Date End Date Taking? Authorizing Provider  acetaminophen (TYLENOL) 500 MG tablet Take 500 mg by mouth daily as needed for mild pain or headache.    Yes [provider]  brimonidine-timolol (COMBIGAN) 0.2-0.5 % ophthalmic solution Place 1 drop into both eyes every 12 (twelve) hours.   Yes [provider]  clobetasol ointment (TEMOVATE) 0.05 % Apply topically 2 (two) times daily. Do not use for more than 7 days in a row. Patient taking differently: Apply 1 application topically daily as needed (rash). Do not use for more than 7 days in a row. 03/26/17  Yes Megan Salon, MD  ezetimibe-simvastatin (VYTORIN) 10-20 MG tablet Take 1 tablet by mouth daily. Patient taking differently: Take 1 tablet by mouth daily at 6 PM.  10/10/17  Yes Marin Olp, MD  HYDROcodone-acetaminophen (NORCO/VICODIN) 5-325 MG tablet Take 1-2 tablets by mouth every 4 (four) hours as needed (pain). 10/27/17  Yes Jovita Gamma, MD  magnesium citrate SOLN Take 195 mLs by mouth once.   Yes [provider]  nadolol (CORGARD) 40 MG tablet take 1 tablet by mouth once daily at bedtime Patient taking differently: take 1 tablet (40mg ) by mouth once daily at bedtime 07/16/17  Yes Marin Olp, MD  sodium chloride (OCEAN) 0.65 % SOLN nasal spray Place 1 spray into both nostrils 2 (two) times daily as needed for congestion.   Yes [provider]  traZODone (DESYREL) 100 MG tablet  TAKE 1/4 TO 1/2 TABLET BY MOUTH AT BEDTIME IF NEEDED FOR SLEEP Patient taking differently: TAKE 1/4 TABLET (25 MG) BY MOUTH DAILY AT BEDTIME 10/20/17  Yes Marin Olp, MD  venlafaxine XR (EFFEXOR-XR) 150 MG 24 hr capsule take 1 capsule by mouth twice a day Patient taking differently: take 1 capsule (150mg ) by mouth twice a day 08/03/17  Yes Marin Olp, MD  aspirin EC 81 MG tablet Take 81 mg by mouth daily.    [provider]  omeprazole (PRILOSEC) 20 MG capsule Take 20 mg by mouth daily before breakfast.    [provider]    Physical Exam: Vitals:   10/31/17 0026 10/31/17 0045 10/31/17 0115 10/31/17 0130  BP: (!) 137/99 (!) 163/93 Marland Kitchen)  150/78 (!) 150/72  Pulse: 80 89 76 81  Resp: 18 19 14  (!) 21  Temp:      TempSrc:      SpO2: 97% 98% 99% 92%      Constitutional: Moderately built and nourished. Vitals:   10/31/17 0026 10/31/17 0045 10/31/17 0115 10/31/17 0130  BP: (!) 137/99 (!) 163/93 (!) 150/78 (!) 150/72  Pulse: 80 89 76 81  Resp: 18 19 14  (!) 21  Temp:      TempSrc:      SpO2: 97% 98% 99% 92%   Eyes: Anicteric no pallor. ENMT: No discharge from the ears eyes nose or mouth. Neck: No mass felt.  No neck rigidity. Respiratory: No rhonchi or crepitations. Cardiovascular: S1-S2 heard no murmurs appreciated. Abdomen: Distended bowel sounds not appreciated soft nontender Musculoskeletal: Surgical site no discharge.  Moves all extremities. Skin: No discharge from the surgical site. Neurologic: Alert awake oriented to time place and person.  Moves all extremities. Psychiatric: Appears normal.  Normal affect.   Labs on Admission: I have personally reviewed following labs and imaging studies  CBC: Recent Labs  Lab 10/30/17 1943  WBC 8.7  HGB 13.2  HCT 40.8  MCV 99.8  PLT 254   Basic Metabolic Panel: Recent Labs  Lab 10/30/17 1943  NA 138  K 3.8  CL 100*  CO2 27  GLUCOSE 109*  BUN 9  CREATININE 0.60  CALCIUM 9.3    GFR: Estimated Creatinine Clearance: 50.4 mL/min (by C-G formula based on SCr of 0.6 mg/dL). Liver Function Tests: Recent Labs  Lab 10/30/17 1943  AST 40  ALT 28  ALKPHOS 64  BILITOT 1.1  PROT 6.9  ALBUMIN 3.3*   Recent Labs  Lab 10/30/17 1943  LIPASE 22   No results for input(s): AMMONIA in the last 168 hours. Coagulation Profile: No results for input(s): INR, PROTIME in the last 168 hours. Cardiac Enzymes: No results for input(s): CKTOTAL, CKMB, CKMBINDEX, TROPONINI in the last 168 hours. BNP (last 3 results) No results for input(s): PROBNP in the last 8760 hours. HbA1C: No results for input(s): HGBA1C in the last 72 hours. CBG: No results for input(s): GLUCAP in the last 168 hours. Lipid Profile: No results for input(s): CHOL, HDL, LDLCALC, TRIG, CHOLHDL, LDLDIRECT in the last 72 hours. Thyroid Function Tests: No results for input(s): TSH, T4TOTAL, FREET4, T3FREE, THYROIDAB in the last 72 hours. Anemia Panel: No results for input(s): VITAMINB12, FOLATE, FERRITIN, TIBC, IRON, RETICCTPCT in the last 72 hours. Urine analysis:    Component Value Date/Time   COLORURINE STRAW (A) 10/31/2017 0151   APPEARANCEUR CLEAR 10/31/2017 0151   LABSPEC 1.026 10/31/2017 0151   PHURINE 9.0 (H) 10/31/2017 0151   GLUCOSEU NEGATIVE 10/31/2017 0151   GLUCOSEU NEGATIVE 08/08/2013 1427   HGBUR NEGATIVE 10/31/2017 0151   BILIRUBINUR NEGATIVE 10/31/2017 0151   BILIRUBINUR n 03/06/2017 1618   KETONESUR 20 (A) 10/31/2017 0151   PROTEINUR NEGATIVE 10/31/2017 0151   UROBILINOGEN 0.2 03/06/2017 1618   UROBILINOGEN 0.2 08/08/2013 1427   NITRITE NEGATIVE 10/31/2017 0151   LEUKOCYTESUR NEGATIVE 10/31/2017 0151   Sepsis Labs: @LABRCNTIP (procalcitonin:4,lacticidven:4) ) Recent Results (from the past 240 hour(s))  Surgical pcr screen     Status: None   Collection Time: 10/21/17  2:15 PM  Result Value Ref Range Status   MRSA, PCR NEGATIVE NEGATIVE Final   Staphylococcus aureus NEGATIVE  NEGATIVE Final    Comment: (NOTE) The Xpert SA Assay (FDA approved for NASAL specimens in patients 22 years  of age and older), is one component of a comprehensive surveillance program. It is not intended to diagnose infection nor to guide or monitor treatment. Performed at [facility], [place] [address], [place], [place] [address]      Radiological Exams on Admission: Dg Chest 2 View  Result Date: 10/30/2017 CLINICAL DATA:  Slightly productive cough with phlegm. Emesis. Patient had lumbar spine surgery on Monday of this week. EXAM: CHEST - 2 VIEW COMPARISON:  CXR report 11/18/2001, lumbar spine radiographs from 10/13/2017 FINDINGS: The heart size and mediastinal contours are within normal limits. Elevated right hemidiaphragm as previously described in 2003. Both lungs are clear. The visualized skeletal structures are unremarkable. L1 kyphoplasty. IMPRESSION: No active cardiopulmonary disease. Electronically Signed   By: Ashley Royalty M.D.   On: 10/30/2017 21:00   Dg Lumbar Spine Complete  Result Date: 10/30/2017 CLINICAL DATA:  Pain.  Anterior lumbar fusion. EXAM: LUMBAR SPINE - COMPLETE 4+ VIEW COMPARISON:  10/26/2017 intraoperative radiographs, 10/13/2017 lumbar spine radiographs. FINDINGS: Spinal fusion hardware noted from L3 through S1 with interbody blocks in place. Kyphoplasty at L1. No apparent complications. No hardware failure. No new fracture is seen. IMPRESSION: 1. Status post anterior lumbar interbody fusion from L3 through L5 with hardware in place. 2. Status post remote L1 kyphoplasty. Electronically Signed   By: Ashley Royalty M.D.   On: 10/30/2017 21:03   Ct Abdomen Pelvis W Contrast  Result Date: 10/31/2017 CLINICAL DATA:  Abdominal distension. Nausea and vomiting. Symptoms since lumbar spine surgery 5 days ago. EXAM: CT ABDOMEN AND PELVIS WITH CONTRAST TECHNIQUE: Multidetector CT imaging of the abdomen and pelvis was performed using the standard protocol following bolus  administration of intravenous contrast. CONTRAST:  100 cc ISOVUE-300 IOPAMIDOL (ISOVUE-300) INJECTION 61% COMPARISON:  None. FINDINGS: Lower chest: Elevated right hemidiaphragm with adjacent basilar atelectasis. Mild hypoventilatory change at the lung bases. Hepatobiliary: 3.4 cm subcapsular lesion in the right dome of the liver. 16 mm subcapsular lesion more inferiorly with fill in on delayed phase imaging suggest hemangioma. There is a similar low-density lesion at the inferior liver tip. Clips in the gallbladder fossa postcholecystectomy. No biliary dilatation. Pancreas: No ductal dilatation or inflammation. Spleen: Normal in size without focal abnormality. Adrenals/Urinary Tract: Normal adrenal glands. No hydronephrosis or perinephric edema. Homogeneous renal enhancement with symmetric excretion on delayed phase imaging. Tiny cortical cysts in the right kidney. Urinary bladder is physiologically distended without wall thickening. Air in the bladder likely secondary to recent catheterization. Stomach/Bowel: Stomach physiologically distended. No bowel wall thickening, obstruction or inflammation. Appendix not visualized, patient with history of appendectomy. Moderate stool burden throughout the colon without colonic wall thickening. Mild rectal distention with stool of 5 cm. Mild distal diverticulosis without diverticulitis. Vascular/Lymphatic: Aorto bi-iliac atherosclerosis. No enlarged abdominal or pelvic lymph nodes. Reproductive: Post hysterectomy. Both ovaries are normal and quiescent. Other: Left lumbar hernia with mild stranding, likely postsurgical. Minimal air in stranding about the bilateral psoas muscles is also likely postsurgical. No evidence of focal fluid collection. No ascites or free fluid in the abdomen or pelvis. No free air. Musculoskeletal: Posterior L3 through L5 fusion with interbody spacers. The hardware is intact. No evidence of postoperative collection. Minimal air in the subcutaneous  tissues about the left lateral abdominal wall, normal postop. L1 kyphoplasty. IMPRESSION: 1. Moderate colonic stool burden with rectal distention, suggesting postoperative constipation. No bowel obstruction or inflammation. 2. Recent postsurgical change in the lumbar spine with expected postsurgical stranding and air. Small left lumbar hernia without  bowel involvement. 3. Hypodense liver lesions, 2 of which likely represent hemangiomas. An additional 3.4 cm lesion in the right hepatic dome does not have typical characteristics of hemangioma may be a complex cyst. No prior exams available for comparison. Recommend further characterization with elective MRI when patient is able tolerate breath hold technique. 4. Incidental colonic diverticulosis. Aortic Atherosclerosis (ICD10-I70.0). Electronically Signed   By: Jeb Levering M.D.   On: 10/31/2017 01:46     Assessment/Plan Principal Problem:   Nausea & vomiting Active Problems:   Hyperlipidemia   Essential hypertension    1. Intractable nausea vomiting likely from developing ileus -since patient states he has not much pain in the surgical site we will hold off any further hydrocodone and narcotics.  1 dose of enema was given in the ER.  I have also ordered per rectal Dulcolax.  May consider IV Reglan.  Gentle hydration. 2. Hypertension on nadolol which will be continued.  I have also place patient on PRN IV hydralazine. 3. Hyperlipidemia we will continue home statins. 4. Recent lumbar surgery -closely observe.  We will get physical therapy consult.   DVT prophylaxis: Lovenox. Code Status: Full code. Family Communication: Patient's husband. Disposition Plan: Home. Consults called: None. Admission status: Observation.   Rise Patience MD [facility] Pager [phone].  If 7PM-7AM, please contact night-coverage [email or web address] Password [facility]  10/31/2017, 5:24 AM

## 2017-10-31 NOTE — ED Notes (Signed)
Nurse starting IV and will draw labs. 

## 2017-10-31 NOTE — ED Notes (Signed)
Pt refusing new IV access at this time.

## 2017-10-31 NOTE — Care Management Obs Status (Signed)
[facility] NOTIFICATION   Patient Details  Name: MEHREEN AZIZI MRN: [id number] Date of Birth: Apr 01, 1936   Medicare Observation Status Notification Given:  Yes    Carles Collet, RN 10/31/2017, 9:24 AM

## 2017-10-31 NOTE — Discharge Summary (Signed)
Physician Discharge Summary  [name]:[id number] DOB: 1936/03/25 DOA: 10/30/2017  PCP: Marin Olp, MD  Admit date: 10/30/2017 Discharge date: 10/31/2017  Admitted From: Home Disposition: Home  Discharge Condition:Stable CODE STATUS:Full Diet recommendation: Heart Healthy    Brief/Interim Summary: HPI: [name] is a 82 y.o. female with history of hypertension, hyperlipidemia who had recent surgery for lumbar stenosis and discharged on October 27, 2017 3 days ago presents to the ER with persistent vomiting since yesterday morning.  Patient has been taking pain medications hydrocodone for last couple of days.  Denies any abdominal pain.  Patient's husband had called up the neurosurgical office after patient had persistent vomiting and was told to take laxative and Fleet enema.  Patient took over-the-counter laxatives along which patient's vomiting worsened.  Had one small bowel movement.  Prior to that patient's last bowel movement was 1 week ago.  Due to persistent vomiting patient came to the ER.  She complained of some pain around the left groin area.  ED Course: In the ER CT abdomen pelvis done shows stool burden in the colon.  Patient was given 1 dose of enema .  Patient started having   bowel movements.  Abdomen was soft.  Patient admitted for further management of persistent nausea vomiting likely from developing ileus.  Hospital Course:  She is seen and examined the bedside this morning.  She was having multiple episodes of bowel movement so bowel obstruction seems to have resolved.  She did not complain of any abdominal pain.  No nausea or vomiting during my evaluation.  Patient was advanced to soft diet and she tolerated very well.  She is stable for discharge to home today.  Following problems were addressed during her hospitalization:  1)Intractable nausea vomiting :Resolved  2)Ileus: Suspected on admission.Resolved.1 dose of enema was given in the ER.  Was  also given rectal Dulcolax resulting in bowel movements  3)Hypertension :On nadolol ,which will be continued.   4)Hyperlipidemia :We will continue home statins.  5)Recent lumbar surgery : Patient has been ambulatory without any problem.    Discharge Diagnoses:  Principal Problem:   Nausea & vomiting Active Problems:   Hyperlipidemia   Essential hypertension    Discharge Instructions  Discharge Instructions    Diet - low sodium heart healthy   Complete by:  As directed    Discharge instructions   Complete by:  As directed    1)Follow up with your PMD in a week.   Increase activity slowly   Complete by:  As directed      Allergies as of 10/31/2017      Reactions   Codeine Nausea And Vomiting   Shingrix [zoster Vac Recomb Adjuvanted] Swelling, Rash   Rash and temp x several days. (low grade 100 -101 )       Medication List    STOP taking these medications   magnesium citrate Soln     TAKE these medications   acetaminophen 500 MG tablet Commonly known as:  TYLENOL Take 500 mg by mouth daily as needed for mild pain or headache.   aspirin EC 81 MG tablet Take 81 mg by mouth daily.   clobetasol ointment 0.05 % Commonly known as:  TEMOVATE Apply topically 2 (two) times daily. Do not use for more than 7 days in a row. What changed:    how much to take  when to take this  reasons to take this  additional instructions   COMBIGAN 0.2-0.5 %  ophthalmic solution Generic drug:  brimonidine-timolol Place 1 drop into both eyes every 12 (twelve) hours.   ezetimibe-simvastatin 10-20 MG tablet Commonly known as:  VYTORIN Take 1 tablet by mouth daily. What changed:  when to take this   HYDROcodone-acetaminophen 5-325 MG tablet Commonly known as:  NORCO/VICODIN Take 1-2 tablets by mouth every 4 (four) hours as needed (pain).   nadolol 40 MG tablet Commonly known as:  CORGARD take 1 tablet by mouth once daily at bedtime What changed:    how much to  take  how to take this  when to take this   omeprazole 20 MG capsule Commonly known as:  PRILOSEC Take 20 mg by mouth daily before breakfast.   ondansetron 4 MG tablet Commonly known as:  ZOFRAN Take 1 tablet (4 mg total) by mouth every 6 (six) hours as needed for nausea or vomiting.   sodium chloride 0.65 % Soln nasal spray Commonly known as:  OCEAN Place 1 spray into both nostrils 2 (two) times daily as needed for congestion.   traZODone 100 MG tablet Commonly known as:  DESYREL TAKE 1/4 TO 1/2 TABLET BY MOUTH AT BEDTIME IF NEEDED FOR SLEEP What changed:  See the new instructions.   venlafaxine XR 150 MG 24 hr capsule Commonly known as:  EFFEXOR-XR take 1 capsule by mouth twice a day What changed:    how much to take  how to take this  when to take this      Follow-up Information    Marin Olp, MD. Schedule an appointment as soon as possible for a visit in 1 week(s).   Specialty:  Family Medicine Contact information: [place] [phone]          Allergies  Allergen Reactions  . Codeine Nausea And Vomiting  . Shingrix [Zoster Vac Recomb Adjuvanted] Swelling and Rash    Rash and temp x several days. (low grade 100 -101 )     Consultations: None  Procedures/Studies: Dg Chest 2 View  Result Date: 10/30/2017 CLINICAL DATA:  Slightly productive cough with phlegm. Emesis. Patient had lumbar spine surgery on Monday of this week. EXAM: CHEST - 2 VIEW COMPARISON:  CXR report 11/18/2001, lumbar spine radiographs from 10/13/2017 FINDINGS: The heart size and mediastinal contours are within normal limits. Elevated right hemidiaphragm as previously described in 2003. Both lungs are clear. The visualized skeletal structures are unremarkable. L1 kyphoplasty. IMPRESSION: No active cardiopulmonary disease. Electronically Signed   By: Ashley Royalty M.D.   On: 10/30/2017 21:00   Dg Lumbar Spine 2-3 Views  Result Date:  10/26/2017 CLINICAL DATA:  Status post L3 through L5 XLIF with posterior screw placement. EXAM: LUMBAR SPINE - 2-3 VIEW; DG C-ARM 61-120 MIN COMPARISON:  No recent studies in [facility] FINDINGS: The patient has undergone placement of pedicle screws and connecting rods from L3 through L5 with inter discal device is placed at L3-4 and L4-5. Radiographic positioning of the appliances appears good. IMPRESSION: No immediate postprocedure complication following L3 through L5 XLIF. Electronically Signed   By: David  Martinique M.D.   On: 10/26/2017 12:17   Dg Lumbar Spine Complete  Result Date: 10/30/2017 CLINICAL DATA:  Pain.  Anterior lumbar fusion. EXAM: LUMBAR SPINE - COMPLETE 4+ VIEW COMPARISON:  10/26/2017 intraoperative radiographs, 10/13/2017 lumbar spine radiographs. FINDINGS: Spinal fusion hardware noted from L3 through S1 with interbody blocks in place. Kyphoplasty at L1. No apparent complications. No hardware failure. No new fracture is seen. IMPRESSION: 1.  Status post anterior lumbar interbody fusion from L3 through L5 with hardware in place. 2. Status post remote L1 kyphoplasty. Electronically Signed   By: Ashley Royalty M.D.   On: 10/30/2017 21:03   Ct Abdomen Pelvis W Contrast  Result Date: 10/31/2017 CLINICAL DATA:  Abdominal distension. Nausea and vomiting. Symptoms since lumbar spine surgery 5 days ago. EXAM: CT ABDOMEN AND PELVIS WITH CONTRAST TECHNIQUE: Multidetector CT imaging of the abdomen and pelvis was performed using the standard protocol following bolus administration of intravenous contrast. CONTRAST:  100 cc ISOVUE-300 IOPAMIDOL (ISOVUE-300) INJECTION 61% COMPARISON:  None. FINDINGS: Lower chest: Elevated right hemidiaphragm with adjacent basilar atelectasis. Mild hypoventilatory change at the lung bases. Hepatobiliary: 3.4 cm subcapsular lesion in the right dome of the liver. 16 mm subcapsular lesion more inferiorly with fill in on delayed phase imaging suggest hemangioma. There is a similar  low-density lesion at the inferior liver tip. Clips in the gallbladder fossa postcholecystectomy. No biliary dilatation. Pancreas: No ductal dilatation or inflammation. Spleen: Normal in size without focal abnormality. Adrenals/Urinary Tract: Normal adrenal glands. No hydronephrosis or perinephric edema. Homogeneous renal enhancement with symmetric excretion on delayed phase imaging. Tiny cortical cysts in the right kidney. Urinary bladder is physiologically distended without wall thickening. Air in the bladder likely secondary to recent catheterization. Stomach/Bowel: Stomach physiologically distended. No bowel wall thickening, obstruction or inflammation. Appendix not visualized, patient with history of appendectomy. Moderate stool burden throughout the colon without colonic wall thickening. Mild rectal distention with stool of 5 cm. Mild distal diverticulosis without diverticulitis. Vascular/Lymphatic: Aorto bi-iliac atherosclerosis. No enlarged abdominal or pelvic lymph nodes. Reproductive: Post hysterectomy. Both ovaries are normal and quiescent. Other: Left lumbar hernia with mild stranding, likely postsurgical. Minimal air in stranding about the bilateral psoas muscles is also likely postsurgical. No evidence of focal fluid collection. No ascites or free fluid in the abdomen or pelvis. No free air. Musculoskeletal: Posterior L3 through L5 fusion with interbody spacers. The hardware is intact. No evidence of postoperative collection. Minimal air in the subcutaneous tissues about the left lateral abdominal wall, normal postop. L1 kyphoplasty. IMPRESSION: 1. Moderate colonic stool burden with rectal distention, suggesting postoperative constipation. No bowel obstruction or inflammation. 2. Recent postsurgical change in the lumbar spine with expected postsurgical stranding and air. Small left lumbar hernia without bowel involvement. 3. Hypodense liver lesions, 2 of which likely represent hemangiomas. An  additional 3.4 cm lesion in the right hepatic dome does not have typical characteristics of hemangioma may be a complex cyst. No prior exams available for comparison. Recommend further characterization with elective MRI when patient is able tolerate breath hold technique. 4. Incidental colonic diverticulosis. Aortic Atherosclerosis (ICD10-I70.0). Electronically Signed   By: Jeb Levering M.D.   On: 10/31/2017 01:46   Dg C-arm 1-60 Min  Result Date: 10/26/2017 CLINICAL DATA:  Status post L3 through L5 XLIF with posterior screw placement. EXAM: LUMBAR SPINE - 2-3 VIEW; DG C-ARM 61-120 MIN COMPARISON:  No recent studies in [facility] FINDINGS: The patient has undergone placement of pedicle screws and connecting rods from L3 through L5 with inter discal device is placed at L3-4 and L4-5. Radiographic positioning of the appliances appears good. IMPRESSION: No immediate postprocedure complication following L3 through L5 XLIF. Electronically Signed   By: David  Martinique M.D.   On: 10/26/2017 12:17   Dg C-arm 1-60 Min  Result Date: 10/26/2017 CLINICAL DATA:  Status post L3 through L5 XLIF with posterior screw placement. EXAM: LUMBAR SPINE - 2-3 VIEW; DG C-ARM  61-120 MIN COMPARISON:  No recent studies in [facility] FINDINGS: The patient has undergone placement of pedicle screws and connecting rods from L3 through L5 with inter discal device is placed at L3-4 and L4-5. Radiographic positioning of the appliances appears good. IMPRESSION: No immediate postprocedure complication following L3 through L5 XLIF. Electronically Signed   By: David  Martinique M.D.   On: 10/26/2017 12:17    (Echo, Carotid, EGD, Colonoscopy, ERCP)    Subjective:   Discharge Exam: Vitals:   10/31/17 0315 10/31/17 0801  BP: (!) 152/77 (!) 173/87  Pulse: 81 89  Resp: (!) 25 (!) 22  Temp:  98.4 F (36.9 C)  SpO2: 97% 98%   Vitals:   10/31/17 0245 10/31/17 0300 10/31/17 0315 10/31/17 0801  BP: (!) 150/70 123/76 (!) 152/77 (!) 173/87  Pulse: 78  78 81 89  Resp: 18 11 (!) 25 (!) 22  Temp:    98.4 F (36.9 C)  TempSrc:    Oral  SpO2: 97% 99% 97% 98%    General: Pt is alert, awake, not in acute distress Cardiovascular: RRR, S1/S2 +, no rubs, no gallops Respiratory: CTA bilaterally, no wheezing, no rhonchi Abdominal: Soft, NT, ND, bowel sounds + Extremities: no edema, no cyanosis    The results of significant diagnostics from this hospitalization (including imaging, microbiology, ancillary and laboratory) are listed below for reference.     Microbiology: Recent Results (from the past 240 hour(s))  Surgical pcr screen     Status: None   Collection Time: 10/21/17  2:15 PM  Result Value Ref Range Status   MRSA, PCR NEGATIVE NEGATIVE Final   Staphylococcus aureus NEGATIVE NEGATIVE Final    Comment: (NOTE) The Xpert SA Assay (FDA approved for NASAL specimens in patients 52 years of age and older), is one component of a comprehensive surveillance program. It is not intended to diagnose infection nor to guide or monitor treatment. Performed at [facility], [place] [address], [place], [place] [address]      Labs: BNP (last 3 results) No results for input(s): BNP in the last 8760 hours. Basic Metabolic Panel: Recent Labs  Lab 10/30/17 1943  NA 138  K 3.8  CL 100*  CO2 27  GLUCOSE 109*  BUN 9  CREATININE 0.60  CALCIUM 9.3   Liver Function Tests: Recent Labs  Lab 10/30/17 1943  AST 40  ALT 28  ALKPHOS 64  BILITOT 1.1  PROT 6.9  ALBUMIN 3.3*   Recent Labs  Lab 10/30/17 1943  LIPASE 22   No results for input(s): AMMONIA in the last 168 hours. CBC: Recent Labs  Lab 10/30/17 1943  WBC 8.7  HGB 13.2  HCT 40.8  MCV 99.8  PLT 262   Cardiac Enzymes: No results for input(s): CKTOTAL, CKMB, CKMBINDEX, TROPONINI in the last 168 hours. BNP: Invalid input(s): POCBNP CBG: No results for input(s): GLUCAP in the last 168 hours. D-Dimer No results for input(s): DDIMER in the last 72 hours. Hgb  A1c No results for input(s): HGBA1C in the last 72 hours. Lipid Profile No results for input(s): CHOL, HDL, LDLCALC, TRIG, CHOLHDL, LDLDIRECT in the last 72 hours. Thyroid function studies No results for input(s): TSH, T4TOTAL, T3FREE, THYROIDAB in the last 72 hours.  Invalid input(s): FREET3 Anemia work up No results for input(s): VITAMINB12, FOLATE, FERRITIN, TIBC, IRON, RETICCTPCT in the last 72 hours. Urinalysis    Component Value Date/Time   COLORURINE STRAW (A) 10/31/2017 0151   APPEARANCEUR CLEAR 10/31/2017 0151  LABSPEC 1.026 10/31/2017 0151   PHURINE 9.0 (H) 10/31/2017 0151   GLUCOSEU NEGATIVE 10/31/2017 0151   GLUCOSEU NEGATIVE 08/08/2013 1427   HGBUR NEGATIVE 10/31/2017 0151   BILIRUBINUR NEGATIVE 10/31/2017 0151   BILIRUBINUR n 03/06/2017 1618   KETONESUR 20 (A) 10/31/2017 0151   PROTEINUR NEGATIVE 10/31/2017 0151   UROBILINOGEN 0.2 03/06/2017 1618   UROBILINOGEN 0.2 08/08/2013 1427   NITRITE NEGATIVE 10/31/2017 0151   LEUKOCYTESUR NEGATIVE 10/31/2017 0151   Sepsis Labs Invalid input(s): PROCALCITONIN,  WBC,  LACTICIDVEN Microbiology Recent Results (from the past 240 hour(s))  Surgical pcr screen     Status: None   Collection Time: 10/21/17  2:15 PM  Result Value Ref Range Status   MRSA, PCR NEGATIVE NEGATIVE Final   Staphylococcus aureus NEGATIVE NEGATIVE Final    Comment: (NOTE) The Xpert SA Assay (FDA approved for NASAL specimens in patients 42 years of age and older), is one component of a comprehensive surveillance program. It is not intended to diagnose infection nor to guide or monitor treatment. Performed at [facility], [place] [address] [place], [place] [address]      Time coordinating discharge: Over 30 minutes  SIGNED:   Shelly Coss, MD  [facility] 10/31/2017, 1:49 PM Pager [phone]  If 7PM-7AM, please contact night-coverage [email or web address] Password [email or web address]

## 2017-11-01 ENCOUNTER — Encounter (HOSPITAL_COMMUNITY): Payer: Self-pay | Admitting: Emergency Medicine

## 2017-11-01 ENCOUNTER — Other Ambulatory Visit: Payer: Self-pay

## 2017-11-01 ENCOUNTER — Emergency Department (HOSPITAL_COMMUNITY)
Admission: EM | Admit: 2017-11-01 | Discharge: 2017-11-01 | Disposition: A | Discharge status: Home / Self Care | Payer: Medicare Other | Attending: Emergency Medicine | Admitting: Emergency Medicine

## 2017-11-01 ENCOUNTER — Emergency Department (HOSPITAL_COMMUNITY): Payer: Medicare Other

## 2017-11-01 DIAGNOSIS — R112 Nausea with vomiting, unspecified: Secondary | ICD-10-CM | POA: Insufficient documentation

## 2017-11-01 DIAGNOSIS — R110 Nausea: Secondary | ICD-10-CM

## 2017-11-01 DIAGNOSIS — R410 Disorientation, unspecified: Secondary | ICD-10-CM | POA: Insufficient documentation

## 2017-11-01 DIAGNOSIS — Z79899 Other long term (current) drug therapy: Secondary | ICD-10-CM | POA: Diagnosis not present

## 2017-11-01 DIAGNOSIS — I10 Essential (primary) hypertension: Secondary | ICD-10-CM | POA: Diagnosis not present

## 2017-11-01 DIAGNOSIS — Z7982 Long term (current) use of aspirin: Secondary | ICD-10-CM | POA: Insufficient documentation

## 2017-11-01 DIAGNOSIS — R109 Unspecified abdominal pain: Secondary | ICD-10-CM | POA: Insufficient documentation

## 2017-11-01 DIAGNOSIS — R531 Weakness: Secondary | ICD-10-CM | POA: Diagnosis not present

## 2017-11-01 DIAGNOSIS — Z87891 Personal history of nicotine dependence: Secondary | ICD-10-CM | POA: Diagnosis not present

## 2017-11-01 LAB — BASIC METABOLIC PANEL
Anion gap: 13 (ref 5–15)
BUN: 12 mg/dL (ref 6–20)
CHLORIDE: 102 mmol/L (ref 101–111)
CO2: 23 mmol/L (ref 22–32)
Calcium: 9 mg/dL (ref 8.9–10.3)
Creatinine, Ser: 0.74 mg/dL (ref 0.44–1.00)
GFR calc non Af Amer: >60 mL/min (ref >60)
Glucose, Bld: 83 mg/dL (ref 65–99)
Potassium: 3.6 mmol/L (ref 3.5–5.1)
SODIUM: 138 mmol/L (ref 135–145)

## 2017-11-01 LAB — CBC
HEMATOCRIT: 42.6 % (ref 36.0–46.0)
Hemoglobin: 13.7 g/dL (ref 12.0–15.0)
MCH: 32.6 pg (ref 26.0–34.0)
MCHC: 32.2 g/dL (ref 30.0–36.0)
MCV: 101.4 fL — AB (ref 78.0–100.0)
Platelets: 223 10*3/uL (ref 150–400)
RBC: 4.2 MIL/uL (ref 3.87–5.11)
RDW: 14.4 % (ref 11.5–15.5)
WBC: 7.8 10*3/uL (ref 4.0–10.5)

## 2017-11-01 LAB — CBG MONITORING, ED: Glucose-Capillary: 83 mg/dL (ref 65–99)

## 2017-11-01 LAB — URINALYSIS, ROUTINE W REFLEX MICROSCOPIC
BACTERIA UA: NONE SEEN
Bilirubin Urine: NEGATIVE
Glucose, UA: NEGATIVE mg/dL
Hgb urine dipstick: NEGATIVE
KETONES UR: 80 mg/dL — AB
Nitrite: NEGATIVE
Protein, ur: NEGATIVE mg/dL
Specific Gravity, Urine: 1.02 (ref 1.005–1.030)
pH: 5 (ref 5.0–8.0)

## 2017-11-01 LAB — CK: CK TOTAL: 143 U/L (ref 38–234)

## 2017-11-01 MED ORDER — SODIUM CHLORIDE 0.9 % IV BOLUS (SEPSIS)
1000.0000 mL | Freq: Once | INTRAVENOUS | Status: AC
  Administered 2017-11-01: 1000 mL via INTRAVENOUS

## 2017-11-01 MED ORDER — ONDANSETRON 4 MG PO TBDP
8.0000 mg | ORAL_TABLET | Freq: Once | ORAL | Status: AC
Start: 2017-11-01 — End: 2017-11-01
  Administered 2017-11-01: 8 mg via ORAL
  Filled 2017-11-01: qty 2

## 2017-11-01 NOTE — ED Notes (Signed)
E-signature not available, pt verbalized understanding of DC instructions  

## 2017-11-01 NOTE — Discharge Instructions (Signed)
Please follow up with Dr. Rita Ohara Continue Zofran for nausea Return if worsening

## 2017-11-01 NOTE — ED Provider Notes (Signed)
[facility] EMERGENCY DEPARTMENT Provider Note   CSN: [id number] Arrival date & time: 11/01/17  1120     History   Chief Complaint Chief Complaint  Patient presents with  . Weakness  . Nausea    HPI [name] is a 82 y.o. female with a hx of HTN, GERD, stress incontinence, hypercholesterolemia, and depression/anxiety who returns to the ED for continued nausea/vomiting and generalized weakness today. Patient is s/p lumbar spinal fusion with a posterior lateral approach on 10/26/17 by Dr. Sherwood Gambler (underwent a L3-4 and L4-5 XLIF with L3-5 percutaneous pedicle screw fixation) and was discharged on 10/27/17. She presented to the ED yesterday for nausea/vomiting, abdominal discomfort, and not having had a substantial BM post op. Patient had thorough work-up in the emergency department including labs, lumbar X-ray with hardware intact, negative CXR, and CT abdomen pelvis tha revealed moderate colonic stool burden suggesting post-op constipation, no bowel obstruction or inflammation. Patient had enema in the ED with large volume bowel movement and improvement in abdominal pain. She eventually required admission for intractable vomiting. She was able to tolerate PO and was discharged same day. She returns today via EMS due to nausea/vomiting, generalized weakness, and some confusion. Patient describes confusion as having difficulty answering some questions. She has had decreased PO intake, had some soup last night, drank something last yesterday. No specific alleviating/aggravating factors. She denies fever, chest pain, dyspnea, or abdominal pain.   Patient has been ambulating with assistance with a cain.   HPI  Past Medical History:  Diagnosis Date  . Anxiety   . Colon polyps   . Complication of anesthesia    takes longer to wake up- admitted overnight after colonoscopy  . Depression   . GERD (gastroesophageal reflux disease)   . Glaucoma   . Headache    migraines    . Hx: UTI (urinary tract infection)   . Hypercholesteremia   . Hypertension   . Lichen simplex chronicus   . Lumbar stenosis with neurogenic claudication   . Macular degeneration   . Osteoarthritis   . Osteopenia    of the hip  . PONV (postoperative nausea and vomiting)   . Stress incontinence, female   . Unsteady gait   . Urinary urgency   . Wears glasses   . Whooping cough    as a baby    Patient Active Problem List   Diagnosis Date Noted  . Nausea & vomiting 10/31/2017  . Chronic daily headache 07/08/2017  . Memory changes 03/06/2017  . Vaginal itching 04/04/2016  . Lumbar stenosis with neurogenic claudication 03/20/2016  . Scalp pain 10/22/2015  . Post-traumatic headache 10/11/2015  . Routine health maintenance 03/06/2013  . Macular degeneration of right eye 03/03/2013  . Glaucoma 03/03/2013  . Hyperlipidemia 03/14/2009  . Depression 10/19/2007  . Essential hypertension 10/19/2007  . GASTROESOPHAGEAL REFLUX DISEASE 10/19/2007  . Osteoarthritis 10/19/2007  . STRESS INCONTINENCE 10/19/2007  . Allergic state 10/19/2007    Past Surgical History:  Procedure Laterality Date  . ABDOMINAL HYSTERECTOMY     ovaries remain  . ANTERIOR LAT LUMBAR FUSION N/A 10/26/2017   Procedure: LUMBAR THREE- LUMBAR FOUR, LUMBAR FOUR- LUMBAR FIVE ANTEROLATERAL LUMBAR INTERBODY ARTHRODESIS;  Surgeon: Jovita Gamma, MD;  Location: [facility];  Service: Neurosurgery;  Laterality: N/A;  LUMBAR 3- LUMBAR 4, LUMBAR 4- LUMBAR 5 ANTEROLATERAL LUMBAR INTERBODY ARTHRODESIS, LUMBAR 3- LUMBAR 4, LUMBAR 4- LUMBAR 5 PERCUTANEOUS PEDICLE SCREW FIXATION  . APPENDECTOMY    . BLADDER REPAIR  x two  . CHOLECYSTECTOMY    . COLONOSCOPY    . EYE SURGERY  11/14, 12/14   Macular Dengeneration, Glaucoma  . EYE SURGERY  2016   left eye  . LESION REMOVAL Right 06/28/2013   Procedure: EXCISION 3 cm right labial sebaceous cyst;  Surgeon: Lyman Speller, MD;  Location: [facility];  Service: Gynecology;   Laterality: Right;  . LUMBAR LAMINECTOMY/DECOMPRESSION MICRODISCECTOMY Right 03/20/2016   Procedure: Right - Lumbar four-five lumbar laminotomy, foraminotomy, and possible microdiscectomy;  Surgeon: Jovita Gamma, MD;  Location: [facility];  Service: Neurosurgery;  Laterality: Right;  right  . LUMBAR PERCUTANEOUS PEDICLE SCREW 2 LEVEL  10/26/2017   Procedure: LUMBAR THREE- LUMBAR FOUR, LUMBAR FOUR- LUMBAR FIVE PERCUTANEOUS PEDICLE SCREW FIXATION;  Surgeon: Jovita Gamma, MD;  Location: [facility];  Service: Neurosurgery;;  . MOHS SURGERY     procedure to remove basal cell  . TONSILLECTOMY AND ADENOIDECTOMY    . URETHRAL SLING  2007  . URETHRAL SLING  1/12   midurethral   . vertebroplasty secondary to traumatic compression fracture       OB History    Gravida  4   Para  2   Term      Preterm      AB      Living  2     SAB      TAB      Ectopic      Multiple      Live Births               Home Medications    Prior to Admission medications   Medication Sig Start Date End Date Taking? Authorizing Provider  acetaminophen (TYLENOL) 500 MG tablet Take 500 mg by mouth daily as needed for mild pain or headache.     [provider]  aspirin EC 81 MG tablet Take 81 mg by mouth daily.    [provider]  brimonidine-timolol (COMBIGAN) 0.2-0.5 % ophthalmic solution Place 1 drop into both eyes every 12 (twelve) hours.    [provider]  clobetasol ointment (TEMOVATE) 0.05 % Apply topically 2 (two) times daily. Do not use for more than 7 days in a row. Patient taking differently: Apply 1 application topically daily as needed (rash). Do not use for more than 7 days in a row. 03/26/17   Megan Salon, MD  ezetimibe-simvastatin (VYTORIN) 10-20 MG tablet Take 1 tablet by mouth daily. Patient taking differently: Take 1 tablet by mouth daily at 6 PM.  10/10/17   Marin Olp, MD  HYDROcodone-acetaminophen (NORCO/VICODIN) 5-325 MG tablet Take 1-2  tablets by mouth every 4 (four) hours as needed (pain). 10/27/17   Jovita Gamma, MD  nadolol (CORGARD) 40 MG tablet take 1 tablet by mouth once daily at bedtime Patient taking differently: take 1 tablet (40mg ) by mouth once daily at bedtime 07/16/17   Marin Olp, MD  omeprazole (PRILOSEC) 20 MG capsule Take 20 mg by mouth daily before breakfast.    [provider]  ondansetron (ZOFRAN) 4 MG tablet Take 1 tablet (4 mg total) by mouth every 6 (six) hours as needed for nausea or vomiting. 10/31/17   Shelly Coss, MD  sodium chloride (OCEAN) 0.65 % SOLN nasal spray Place 1 spray into both nostrils 2 (two) times daily as needed for congestion.    [provider]  traZODone (DESYREL) 100 MG tablet TAKE 1/4 TO 1/2 TABLET BY MOUTH AT BEDTIME IF NEEDED FOR  SLEEP Patient taking differently: TAKE 1/4 TABLET (25 MG) BY MOUTH DAILY AT BEDTIME 10/20/17   Marin Olp, MD  venlafaxine XR (EFFEXOR-XR) 150 MG 24 hr capsule take 1 capsule by mouth twice a day Patient taking differently: take 1 capsule (150mg ) by mouth twice a day 08/03/17   Marin Olp, MD    Family History Family History  Problem Relation Age of Onset  . Congestive Heart Failure Mother   . Thyroid disease Mother   . Osteoporosis Mother   . Alcoholism Mother   . Prostate cancer Father   . Breast cancer Maternal Grandmother   . Hypertension Sister   . Thyroid disease Sister   . Cancer Sister        brain ?  Marland Kitchen Rheum arthritis Daughter     Social History Social History   Tobacco Use  . Smoking status: Former Smoker    Last attempt to quit: 08/11/1965    Years since quitting: 52.2  . Smokeless tobacco: Never Used  . Tobacco comment: has not smoked in 45 years   Substance Use Topics  . Alcohol use: Yes    Alcohol/week: 0.6 oz    Types: 1 Standard drinks or equivalent per week    Comment: occ   . Drug use: No     Allergies   Codeine and Shingrix [zoster vac recomb adjuvanted]   Review of  Systems Review of Systems  Constitutional: Negative for fever.  HENT: Positive for congestion.   Eyes: Negative for visual disturbance.  Respiratory: Negative for shortness of breath.   Cardiovascular: Negative for chest pain.  Gastrointestinal: Positive for nausea and vomiting. Negative for abdominal pain, blood in stool and diarrhea.  Genitourinary: Negative for dysuria.  Neurological: Positive for weakness (generalized, nonfocal). Negative for numbness and headaches.  Psychiatric/Behavioral: Positive for confusion.  All other systems reviewed and are negative.  Physical Exam Updated Vital Signs BP (!) 156/78   Pulse 77   Temp 97.9 F (36.6 C) (Oral)   Resp 16   LMP 08/11/1974   SpO2 96%   Physical Exam  Constitutional: She is oriented to person, place, and time. She appears well-developed.  Non-toxic appearance. No distress.  HENT:  Head: Normocephalic and atraumatic.  Right Ear: Tympanic membrane is not erythematous, not retracted and not bulging.  Left Ear: Tympanic membrane is not erythematous, not retracted and not bulging.  Nose: Mucosal edema (mild congestion present) present.  Mouth/Throat: Uvula is midline. No oropharyngeal exudate or posterior oropharyngeal erythema.  Eyes: Conjunctivae are normal. Right eye exhibits no discharge. Left eye exhibits no discharge.  Cardiovascular: Normal rate and regular rhythm.  No murmur heard. Pulmonary/Chest: Breath sounds normal. No respiratory distress. She has no wheezes. She has no rhonchi. She has no rales.  Abdominal: Soft. She exhibits no distension. There is tenderness (very mild diffuse). There is no rigidity, no rebound and no guarding.  Musculoskeletal:  Back: Patient has lumbar surgical incision scars which appear to be healing well.  There is no surrounding erythema, no overlying warmth, no purulent drainage.  There is significant surrounding ecchymosis extending to the left lower back more prominently.  Neurological:  She is alert and oriented to person, place, and time.  Clear speech. No facial droop. CN III-XII grossly intact. Sensation grossly intact to bilateral upper/lower extremities. No focal strength deficits.   Skin: Skin is warm and dry. No rash noted.  Psychiatric: She has a normal mood and affect. Her behavior is normal.  Nursing note  and vitals reviewed.   ED Treatments / Results  Labs Results for orders placed or performed during the hospital encounter of 83/66/29  Basic metabolic panel  Result Value Ref Range   Sodium 138 135 - 145 mmol/L   Potassium 3.6 3.5 - 5.1 mmol/L   Chloride 102 101 - 111 mmol/L   CO2 23 22 - 32 mmol/L   Glucose, Bld 83 65 - 99 mg/dL   BUN 12 6 - 20 mg/dL   Creatinine, Ser 0.74 0.44 - 1.00 mg/dL   Calcium 9.0 8.9 - 10.3 mg/dL   GFR calc non Af Amer >60 >60 mL/min   GFR calc Af Amer >60 >60 mL/min   Anion gap 13 5 - 15  CBC  Result Value Ref Range   WBC 7.8 4.0 - 10.5 K/uL   RBC 4.20 3.87 - 5.11 MIL/uL   Hemoglobin 13.7 12.0 - 15.0 g/dL   HCT 42.6 36.0 - 46.0 %   MCV 101.4 (H) 78.0 - 100.0 fL   MCH 32.6 26.0 - 34.0 pg   MCHC 32.2 30.0 - 36.0 g/dL   RDW 14.4 11.5 - 15.5 %   Platelets 223 150 - 400 K/uL  CBG monitoring, ED  Result Value Ref Range   Glucose-Capillary 83 65 - 99 mg/dL   EKG EKG Interpretation  Date/Time:  Sunday November 01 2017 13:09:52 EDT Ventricular Rate:  63 PR Interval:    QRS Duration: 78 QT Interval:  428 QTC Calculation: 439 R Axis:   -25 Text Interpretation:  Sinus rhythm Low voltage, precordial leads Left ventricular hypertrophy no acute ischemia, no sig change from previous Confirmed by Charlesetta Shanks [phone]) on 11/01/2017 4:08:28 PM   Radiology Dg Chest 2 View  Result Date: 10/30/2017 CLINICAL DATA:  Slightly productive cough with phlegm. Emesis. Patient had lumbar spine surgery on Monday of this week. EXAM: CHEST - 2 VIEW COMPARISON:  CXR report 11/18/2001, lumbar spine radiographs from 10/13/2017 FINDINGS: The  heart size and mediastinal contours are within normal limits. Elevated right hemidiaphragm as previously described in 2003. Both lungs are clear. The visualized skeletal structures are unremarkable. L1 kyphoplasty. IMPRESSION: No active cardiopulmonary disease. Electronically Signed   By: Ashley Royalty M.D.   On: 10/30/2017 21:00   Dg Lumbar Spine Complete  Result Date: 10/30/2017 CLINICAL DATA:  Pain.  Anterior lumbar fusion. EXAM: LUMBAR SPINE - COMPLETE 4+ VIEW COMPARISON:  10/26/2017 intraoperative radiographs, 10/13/2017 lumbar spine radiographs. FINDINGS: Spinal fusion hardware noted from L3 through S1 with interbody blocks in place. Kyphoplasty at L1. No apparent complications. No hardware failure. No new fracture is seen. IMPRESSION: 1. Status post anterior lumbar interbody fusion from L3 through L5 with hardware in place. 2. Status post remote L1 kyphoplasty. Electronically Signed   By: Ashley Royalty M.D.   On: 10/30/2017 21:03   Ct Abdomen Pelvis W Contrast  Result Date: 10/31/2017 CLINICAL DATA:  Abdominal distension. Nausea and vomiting. Symptoms since lumbar spine surgery 5 days ago. EXAM: CT ABDOMEN AND PELVIS WITH CONTRAST TECHNIQUE: Multidetector CT imaging of the abdomen and pelvis was performed using the standard protocol following bolus administration of intravenous contrast. CONTRAST:  100 cc ISOVUE-300 IOPAMIDOL (ISOVUE-300) INJECTION 61% COMPARISON:  None. FINDINGS: Lower chest: Elevated right hemidiaphragm with adjacent basilar atelectasis. Mild hypoventilatory change at the lung bases. Hepatobiliary: 3.4 cm subcapsular lesion in the right dome of the liver. 16 mm subcapsular lesion more inferiorly with fill in on delayed phase imaging suggest hemangioma. There is a similar low-density lesion  at the inferior liver tip. Clips in the gallbladder fossa postcholecystectomy. No biliary dilatation. Pancreas: No ductal dilatation or inflammation. Spleen: Normal in size without focal abnormality.  Adrenals/Urinary Tract: Normal adrenal glands. No hydronephrosis or perinephric edema. Homogeneous renal enhancement with symmetric excretion on delayed phase imaging. Tiny cortical cysts in the right kidney. Urinary bladder is physiologically distended without wall thickening. Air in the bladder likely secondary to recent catheterization. Stomach/Bowel: Stomach physiologically distended. No bowel wall thickening, obstruction or inflammation. Appendix not visualized, patient with history of appendectomy. Moderate stool burden throughout the colon without colonic wall thickening. Mild rectal distention with stool of 5 cm. Mild distal diverticulosis without diverticulitis. Vascular/Lymphatic: Aorto bi-iliac atherosclerosis. No enlarged abdominal or pelvic lymph nodes. Reproductive: Post hysterectomy. Both ovaries are normal and quiescent. Other: Left lumbar hernia with mild stranding, likely postsurgical. Minimal air in stranding about the bilateral psoas muscles is also likely postsurgical. No evidence of focal fluid collection. No ascites or free fluid in the abdomen or pelvis. No free air. Musculoskeletal: Posterior L3 through L5 fusion with interbody spacers. The hardware is intact. No evidence of postoperative collection. Minimal air in the subcutaneous tissues about the left lateral abdominal wall, normal postop. L1 kyphoplasty. IMPRESSION: 1. Moderate colonic stool burden with rectal distention, suggesting postoperative constipation. No bowel obstruction or inflammation. 2. Recent postsurgical change in the lumbar spine with expected postsurgical stranding and air. Small left lumbar hernia without bowel involvement. 3. Hypodense liver lesions, 2 of which likely represent hemangiomas. An additional 3.4 cm lesion in the right hepatic dome does not have typical characteristics of hemangioma may be a complex cyst. No prior exams available for comparison. Recommend further characterization with elective MRI when  patient is able tolerate breath hold technique. 4. Incidental colonic diverticulosis. Aortic Atherosclerosis (ICD10-I70.0). Electronically Signed   By: Jeb Levering M.D.   On: 10/31/2017 01:46   Dg Abd Portable 2 Views  Result Date: 11/01/2017 CLINICAL DATA:  Generalized weakness and nausea beginning last night. Recent lumbar spine surgery. EXAM: PORTABLE ABDOMEN - 2 VIEW COMPARISON:  CT 10/31/2017 FINDINGS: Bowel gas pattern is nonobstructive with air throughout the colon. No dilated loops of bowel or air-fluid levels. No free peritoneal air. No mass or mass effect. Surgical clips over the right upper quadrant. Fusion hardware from L3-L5 intact. Previous kyphoplasty of L1. IMPRESSION: Nonobstructive bowel gas pattern. Electronically Signed   By: Marin Olp M.D.   On: 11/01/2017 13:52    Procedures Procedures (including critical care time)  Medications Ordered in ED Medications  sodium chloride 0.9 % bolus 1,000 mL (1,000 mLs Intravenous New Bag/Given 11/01/17 1436)   Initial Impression / Assessment and Plan / ED Course  I have reviewed the triage vital signs and the nursing notes.  Pertinent labs & imaging results that were available during my care of the patient were reviewed by me and considered in my medical decision making (see chart for details).   Patient presents with continued N/V, generalized weakness, and some confusion. Patient is nontoxic appearing, no significant abnormalities in vitals. On exam patient is alert and oriented x 3, she is without focal neurologic deficits. Her abdomen is mildly diffusely tender, non focal, no peritoneal signs. Will evaluate with basic labs and abdominal x-ray. EKG without significant ST/T wave changes, nausea persisted from ED visit yesterday, do not think repeat troponin today is necessary at this time, doubt ACS. Lumbar X-ray yesterday with surgical hardware in place, no overlying erythema/warmth/discharge at surgical site, do not suspect  infection from this site. Work-up from yesterday reviewed, do not feel repeat of CT or chest/lumbar x-rays are necessary at this time.   Lab work thus far grossly unremarkable, pending urine. No leukocytosis, anemia, significant electrolyte abnormalities, or abnormality in renal function. Abdominal X-ray with nonobstructive bowel gas pattern- doubt obstruction or perforation. Repeat abdominal exam remains without peritoneal signs- patient is s/p appendectomy and cholecystectomy, additionally doubt pancreatitis or diverticulitis..   Findings and plan of care discussed with supervising physician Dr. Johnney Killian who personally evaluated and examined this patient. Will plan for rehydration with fluids. Will add on CK and myoglobin given significant ecchymosis at surgical site. Urine remains pending at this time. Will PO challenge and evaluate patient mobility with cain she has been using at home.   Patient signed out to Janetta Hora PA-C pending remaining lab work, PO challenge, and ambulation.   Final Clinical Impressions(s) / ED Diagnoses   Final diagnoses:  None    ED Discharge Orders    None       Amaryllis Dyke, PA-C 11/01/17 1636    Charlesetta Shanks, MD 11/05/17 [phone]

## 2017-11-01 NOTE — ED Provider Notes (Signed)
Medical screening examination/treatment/procedure(s) were conducted as a shared visit with non-physician practitioner(s) and myself.  I personally evaluated the patient during the encounter.   EKG Interpretation None     Patient is status post back surgery.  She has had recurrent problems with nausea, vomiting and generalized weakness.  Patient was evaluated in the emergency department and hospitalized the day before her presentation.  Patient is alert but fatigued in appearance.  No respiratory distress.  Heart and lungs normal.  Extensive  ecchymoses to the back but wounds are healing without signs of infection.  We will treat symptomatically and plan to reassess.  If patient is improved consider discharge.   Charlesetta Shanks, MD 11/03/17 [phone]

## 2017-11-01 NOTE — ED Notes (Signed)
Pt was ambulated and was able to walk about 15 ft with cane and assistance. Back brace was applied prior to walking. She had an episode of urinary incontinence while standing up.  She has been placed back on purewick once in bed.

## 2017-11-01 NOTE — ED Triage Notes (Signed)
Per EMS: per EMS from home with general weakness and nausea that began last night.  Pt's husband initially noted pt had some mild confusion. Pt recently discharged on Saturday from [facility], post back surgery.  EMS stated NIH - 0.  PTA vitals 148/110, HR 86, CBG 98.  EMS administered zofran IM.

## 2017-11-01 NOTE — ED Notes (Signed)
Pt passed PO challenge, tolerated fluids well

## 2017-11-01 NOTE — ED Provider Notes (Signed)
82 year old female signed out to me by S Petrucelli PA-C. She was brought in by her husband this morning for confusion. She is s/p back surgery last week and had a recent, although brief, admission for intractable N/V yesterday. She was discharged home with Zofran. Husband states she still has nausea but vomiting is controlled with Zofran. She has had decreased PO intake. Plan is to await UA, CK, and myoglobin results to r/o rhabdo. If she can tolerate PO and ambulate she can be discharged home. She currently can ambulate with a cane and 1 person assist.   6:15 PM Patient still feels nauseous. She is not confused. UA is remarkable for 82 ketones. She was given 1L of fluid. CK is normal. Will give dose of Zofran and PO challenge. Will ambulate with brace in hallway. The patient prefers to go home.  7:19 PM She ambulated in the hall. She tolerated PO. Will d/c     Recardo Evangelist, PA-C 11/01/17 1919    Fredia Sorrow, MD 11/04/17 [phone]

## 2017-11-02 LAB — MYOGLOBIN, SERUM: Myoglobin: 81 ng/mL — ABNORMAL HIGH (ref 25–58)

## 2017-11-03 ENCOUNTER — Telehealth: Payer: Self-pay | Admitting: *Deleted

## 2017-11-03 ENCOUNTER — Telehealth: Payer: Self-pay | Admitting: Family Medicine

## 2017-11-03 NOTE — Telephone Encounter (Signed)
Called and spoke with patient's husband who states she had surgery a little over a week ago. She has been back to the hospital twice since her surgery due to constipation. The husband states she is doing a lot better now.She is having diarrhea now. She is able to eat and drink food now. Husband just ask that if we have a cancellation that we call them and schedule them.

## 2017-11-03 NOTE — Telephone Encounter (Signed)
Didn't know if we could move this up due to the request for xanax- I would want to see her to make sure appropriate given title of "confusion" for trip. See other message from today

## 2017-11-03 NOTE — Telephone Encounter (Signed)
Copied from [place]. Topic: General - Other >> Nov 03, 2017  3:02 PM Cecelia Byars, NT wrote: Reason for CRM: Patient called and would like a prescription for alprazolam .05 mg sent to  [facility] [address], [place] AT [address] [phone] (Phone) [phone] (Fax)

## 2017-11-03 NOTE — Telephone Encounter (Signed)
2 slots on Thursday- can we get her in to evaluate - this would be sooner than 11/13/17 visit already scheduled

## 2017-11-03 NOTE — Telephone Encounter (Signed)
So they are ok on holding off on alprazolam until that time then? Ok to close this note if they are

## 2017-11-03 NOTE — Telephone Encounter (Signed)
Per chart review: Admit date: 10/30/2017 Discharge date: 10/31/2017  Admitted From: Home Disposition: Home  Discharge Condition:Stable CODE STATUS:Full Diet recommendation: Heart Healthy   _______________________________________________ Per telephone call: Transition Care Management Follow-up Telephone Call   Date discharged? 10/31/17   How have you been since you were released from the hospital? "a lot better"   Do you understand why you were in the hospital? yes   Do you understand the discharge instructions? yes   Where were you discharged to? Home   Items Reviewed:  Medications reviewed: yes  Allergies reviewed: yes  Dietary changes reviewed: yes  Referrals reviewed: yes   Functional Questionnaire:   Activities of Daily Living (ADLs):   She states they are independent in the following: ambulation, bathing and hygiene, feeding, continence, grooming, toileting and dressing States they require assistance with the following: per husband patient is doing much better   Any transportation issues/concerns?: no   Any patient concerns? no   Confirmed importance and date/time of follow-up visits scheduled yes  Provider Appointment booked with Dr Yong Channel 11/13/17 11:30    Confirmed with patient if condition begins to worsen call PCP or go to the ER.  Patient was given the office number and encouraged to call back with question or concerns.  : yes

## 2017-11-03 NOTE — Telephone Encounter (Signed)
See note

## 2017-11-10 ENCOUNTER — Ambulatory Visit: Payer: Medicare Other | Admitting: Family Medicine

## 2017-11-10 ENCOUNTER — Encounter: Payer: Self-pay | Admitting: Family Medicine

## 2017-11-10 VITALS — BP 140/78 | HR 63 | Temp 97.3°F | Ht 60.0 in | Wt 160.2 lb

## 2017-11-10 DIAGNOSIS — R413 Other amnesia: Secondary | ICD-10-CM

## 2017-11-10 DIAGNOSIS — E785 Hyperlipidemia, unspecified: Secondary | ICD-10-CM | POA: Diagnosis not present

## 2017-11-10 DIAGNOSIS — M48062 Spinal stenosis, lumbar region with neurogenic claudication: Secondary | ICD-10-CM | POA: Diagnosis not present

## 2017-11-10 DIAGNOSIS — K769 Liver disease, unspecified: Secondary | ICD-10-CM

## 2017-11-10 DIAGNOSIS — R897 Abnormal histological findings in specimens from other organs, systems and tissues: Secondary | ICD-10-CM

## 2017-11-10 DIAGNOSIS — I10 Essential (primary) hypertension: Secondary | ICD-10-CM | POA: Diagnosis not present

## 2017-11-10 DIAGNOSIS — R410 Disorientation, unspecified: Secondary | ICD-10-CM

## 2017-11-10 HISTORY — DX: Liver disease, unspecified: K76.9

## 2017-11-10 LAB — COMPREHENSIVE METABOLIC PANEL
ALBUMIN: 3.8 g/dL (ref 3.5–5.2)
ALK PHOS: 151 U/L — AB (ref 39–117)
ALT: 22 U/L (ref 0–35)
AST: 19 U/L (ref 0–37)
BUN: 17 mg/dL (ref 6–23)
CO2: 28 mEq/L (ref 19–32)
Calcium: 9.8 mg/dL (ref 8.4–10.5)
Chloride: 104 mEq/L (ref 96–112)
Creatinine, Ser: 0.72 mg/dL (ref 0.40–1.20)
GFR: 82.82 mL/min (ref >60.00)
Glucose, Bld: 83 mg/dL (ref 70–99)
POTASSIUM: 4.1 meq/L (ref 3.5–5.1)
Sodium: 140 mEq/L (ref 135–145)
Total Bilirubin: 0.5 mg/dL (ref 0.2–1.2)
Total Protein: 6.7 g/dL (ref 6.0–8.3)

## 2017-11-10 LAB — CBC
HEMATOCRIT: 39.2 % (ref 36.0–46.0)
HEMOGLOBIN: 13.4 g/dL (ref 12.0–15.0)
MCHC: 34.2 g/dL (ref 30.0–36.0)
MCV: 98.2 fl (ref 78.0–100.0)
Platelets: 288 10*3/uL (ref 150.0–400.0)
RBC: 3.99 Mil/uL (ref 3.87–5.11)
RDW: 14.1 % (ref 11.5–15.5)
WBC: 7.4 10*3/uL (ref 4.0–10.5)

## 2017-11-10 MED ORDER — ALPRAZOLAM 0.5 MG PO TABS: 0.2500 mg | ORAL_TABLET | Freq: Two times a day (BID) | ORAL | 0 refills | Status: DC | PRN

## 2017-11-10 NOTE — Progress Notes (Signed)
Subjective:  [name] is a 82 y.o. year old very pleasant female patient who presents for transitional care management and hospital follow up for ileus, vomiting. Patient was hospitalized from 10/30/17 to 10/31/17. A TCM phone call was completed on 11/03/17. Medical complexity moderate   See discussion below   See problem oriented charting as well ROS- confusion has cleared. No chest pain or shortness of breath. Still with some back pain- still with bruising but improved. No fever or chills.    Past Medical History-  Patient Active Problem List   Diagnosis Date Noted  . Liver lesion 11/10/2017    Priority: High  . Memory changes 03/06/2017    Priority: Medium  . Lumbar stenosis with neurogenic claudication 03/20/2016    Priority: Medium  . Scalp pain 10/22/2015    Priority: Medium  . Macular degeneration of right eye 03/03/2013    Priority: Medium  . Glaucoma 03/03/2013    Priority: Medium  . Hyperlipidemia 03/14/2009    Priority: Medium  . Depression 10/19/2007    Priority: Medium  . Essential hypertension 10/19/2007    Priority: Medium  . GASTROESOPHAGEAL REFLUX DISEASE 10/19/2007    Priority: Medium  . Vaginal itching 04/04/2016    Priority: Low  . Post-traumatic headache 10/11/2015    Priority: Low  . Routine health maintenance 03/06/2013    Priority: Low  . Osteoarthritis 10/19/2007    Priority: Low  . STRESS INCONTINENCE 10/19/2007    Priority: Low  . Allergic state 10/19/2007    Priority: Low  . Nausea & vomiting 10/31/2017  . Chronic daily headache 07/08/2017    Medications- reviewed and updated  A medical reconciliation was performed comparing current medicines to hospital discharge medications. Current Outpatient Medications  Medication Sig Dispense Refill  . acetaminophen (TYLENOL) 500 MG tablet Take 500 mg by mouth daily as needed for mild pain or headache.     Marland Kitchen aspirin EC 81 MG tablet Take 81 mg by mouth daily.    . brimonidine-timolol  (COMBIGAN) 0.2-0.5 % ophthalmic solution Place 1 drop into both eyes every 12 (twelve) hours.    Marland Kitchen ezetimibe-simvastatin (VYTORIN) 10-20 MG tablet Take 1 tablet by mouth daily. (Patient taking differently: Take 1 tablet by mouth daily at 6 PM. ) 90 tablet 3  . nadolol (CORGARD) 40 MG tablet take 1 tablet by mouth once daily at bedtime (Patient taking differently: take 1 tablet (40mg ) by mouth once daily at bedtime) 90 tablet 1  . omeprazole (PRILOSEC) 20 MG capsule Take 20 mg by mouth daily before breakfast.    . sodium chloride (OCEAN) 0.65 % SOLN nasal spray Place 1 spray into both nostrils 2 (two) times daily as needed for congestion.    . traZODone (DESYREL) 100 MG tablet TAKE 1/4 TO 1/2 TABLET BY MOUTH AT BEDTIME IF NEEDED FOR SLEEP (Patient taking differently: TAKE 1/4 TABLET (25 MG) BY MOUTH DAILY AT BEDTIME) 15 tablet 1  . venlafaxine XR (EFFEXOR-XR) 150 MG 24 hr capsule take 1 capsule by mouth twice a day (Patient taking differently: take 1 capsule (150mg ) by mouth twice a day) 60 capsule 3  . ALPRAZolam (XANAX) 0.5 MG tablet Take 0.5-1 tablets (0.25-0.5 mg total) by mouth 2 (two) times daily as needed. for anxiety 20 tablet 0  . clobetasol ointment (TEMOVATE) 0.05 % Apply topically 2 (two) times daily. Do not use for more than 7 days in a row. (Patient not taking: Reported on 11/10/2017) 30 g 1   No current facility-administered  medications for this visit.     Objective: BP 140/78 (BP Location: Left Arm, Patient Position: Sitting, Cuff Size: Large)   Pulse 63   Temp (!) 97.3 F (36.3 C) (Oral)   Ht 5' (1.524 m)   Wt 160 lb 3.2 oz (72.7 kg)   LMP 08/11/1974   SpO2 99%   BMI 31.29 kg/m  Gen: NAD, resting comfortably CV: RRR no murmurs rubs or gallops Lungs: CTAB no crackles, wheeze, rhonchi Abdomen: soft/nontender/nondistended/normal bowel sounds. No rebound or guarding.  Ext: no edema Skin: warm, dry, bruising along back but improved from photos Neuro: walks with  cane  Assessment/Plan:  Hospital follow up/TCM Ileus/nausea/vomiting/confusion after back surgery S:  Patient was treated for lumbar stenosis with neurogenic claudication with L3-L4 and L4-L5 XLIF and L3-5 percutaneous pedicle screw fixation mid march and discharged 10/27/17. She returned on 10/30/17 and had persistent vomiting after laxative and fleet enema failed to help her. Last BM had been a week prior. Had been on hydrocodone. Large stool burden on CT- enema in ED helped with bowel movements. Admitted for ileus. Rectal dulcolax also used in hospital. CXR on 10/30/17 showed no active cardiopulmonary disease. I independently reviewed this film- there is a high right diaphragm/hepatomegaly. CT abdomen pelvis showed moderate colonic stool burden and rectal distension, noted postsurgical changes in lumbar spine- postsurgical stranding and air noted, hypodense liver lesions- 2 likely hemangiomas- complex cyst likely for 3rd lesion . No prior exam for comparison. I independently reviewed this film. Elective MRI advised for follow up liver cyst. . Also noted colonic diverticulosis and aortic atherosclerosis. I independently reviewed this CT.   Seen another few days later in ED with confusion, nausea, weakness- having difficulty answering questions. Was given fluids and improved   A lot of improvement over the weekend- able to walk more, fewer mood swings, no confusion over the weekend- seems to be behind her. Bowel movements more regular now with miralax. . Was told 6 months full recovery. Sees neurosurgeon on Friday. Bruising around incision site much improved . Had ck which was normal, myoglobin slightly high thought due to extensive bruising. Will make sure today trending down. Husband had contacted me last week for xanax to help with her mood swings. She has used in the past for very sparing anxiety- I did not feel comfortable refilling until seeing her in person due to concerns could worsen confusion- her  last prescription has expired- they show me a bottle with at least 15 pills in it and last rx was #30.   Home nadolol was continued for hypertension  Home statin continued for hyperlipidemia  A/P: Liver lesion CT 10/31/17- "An additional 3.4 cm lesion in the right hepatic dome does not have typical characteristics of hemangioma may be a complex cyst. No prior exams available for comparison. Recommend further characterization with elective MRI when patient is able tolerate breath hold technique."  She does not feel she could undergo MRI at this time. Elective MRI advised for follow up liver cyst. Planned mid 2018 after recovers from surgery with 2-3 month follow up recommended  Memory changes Confusion Had some confusion after surgery and ileus (potential delirium)- may have mild cognitive impairment which makes this more likely with stressors.   Luckily confusion has cleared at this point out further from surgery and with ileus resolved- will continue to monitor. MMSE was done at [facility]- appears was 29/30.    Lumbar stenosis with neurogenic claudication Pain has improved after surgical intervention- L3-L4 and L4-L5  XLIF and L3-5 percutaneous pedicle screw fixation   Bruising likely caused elevated myoglobin- will check today to make sure trending down  Hypertension- continue nadolol  Hyperlipidemia- continue vytorin  Future Appointments  Date Time Provider [place]  02/16/2018  3:30 PM Marin Olp, MD [facility]  05/14/2018  9:30 AM Megan Salon, MD [place] None   Lab/Order associations: Essential hypertension - Plan: CBC, Comprehensive metabolic panel  Elevated myoglobin level - Plan: Myoglobin, serum  Meds ordered this encounter  Medications  . ALPRAZolam (XANAX) 0.5 MG tablet    Sig: Take 0.5-1 tablets (0.25-0.5 mg total) by mouth 2 (two) times daily as needed. for anxiety    Dispense:  20 tablet    Refill:  0    Return precautions advised.  Garret Reddish, MD

## 2017-11-10 NOTE — Assessment & Plan Note (Signed)
Pain has improved after surgical intervention- L3-L4 and L4-L5 XLIF and L3-5 percutaneous pedicle screw fixation

## 2017-11-10 NOTE — Assessment & Plan Note (Addendum)
CT 10/31/17- "An additional 3.4 cm lesion in the right hepatic dome does not have typical characteristics of hemangioma may be a complex cyst. No prior exams available for comparison. Recommend further characterization with elective MRI when patient is able tolerate breath hold technique."  She does not feel she could undergo MRI at this time. Elective MRI advised for follow up liver cyst. Planned mid 2018 after recovers from surgery with 2-3 month follow up recommended

## 2017-11-10 NOTE — Assessment & Plan Note (Addendum)
Confusion Had some confusion after surgery and ileus (potential delirium)- may have mild cognitive impairment which makes this more likely with stressors.   Luckily confusion has cleared at this point out further from surgery and with ileus resolved- will continue to monitor. MMSE was done at [facility]- appears was 29/30.

## 2017-11-10 NOTE — Patient Instructions (Addendum)
Please stop by lab before you go  No changes today  Lets check back in 2-3 months from now- if you feel you can handle MRI at that time we will go ahead and order

## 2017-11-11 ENCOUNTER — Telehealth: Payer: Self-pay

## 2017-11-11 NOTE — Telephone Encounter (Signed)
Called and spoke to patient and informed her that her CBC was normal (blood counts, infection fighting cells, platelets).  Her CMET was largely normal (kidney, liver, and electrolytes, blood sugar), very mild elevation in one of liver tests which we can continue to monitor at follow up particularly before we get the imaging of liver cyst. Since other liver tests are ok- Dr. Idell Pickles think this is a rush. And her  Myoglobin test from bruising still pending. Patient verbalized understanding.

## 2017-11-12 LAB — MYOGLOBIN, SERUM: MYOGLOBIN: 38 ug/L (ref <=66)

## 2017-11-13 ENCOUNTER — Ambulatory Visit: Payer: Medicare Other | Admitting: Family Medicine

## 2017-11-19 ENCOUNTER — Telehealth: Payer: Self-pay | Admitting: Obstetrics & Gynecology

## 2017-11-19 NOTE — Telephone Encounter (Signed)
Spoke with patient. Patient states that last week she began having urinary frequency. Today is having frequency, urgency, and burning at the end of stream. Denies back pain, fever, or chills. Advised will need to be seen in the office for further evaluation. Patient is agreeable. Offered appointment today, but patient declines. Appointment scheduled for tomorrow 4/12 at 11 am with Dr.Miller. Patient is agreeable to date and time. Aware if symptoms worsen or develops new symptoms will need to be seen for evaluation earlier at our office or urgent care.  Routing to provider for final review. Patient agreeable to disposition. Will close encounter.

## 2017-11-19 NOTE — Telephone Encounter (Signed)
Patient says she think she may have a bladder infection.

## 2017-11-20 ENCOUNTER — Other Ambulatory Visit: Payer: Self-pay | Admitting: Family Medicine

## 2017-11-20 ENCOUNTER — Encounter: Payer: Self-pay | Admitting: Obstetrics & Gynecology

## 2017-11-20 ENCOUNTER — Ambulatory Visit: Payer: Medicare Other | Admitting: Obstetrics & Gynecology

## 2017-11-20 VITALS — BP 138/70 | HR 68 | Temp 97.6°F | Resp 16 | Ht 60.0 in | Wt 160.0 lb

## 2017-11-20 DIAGNOSIS — R300 Dysuria: Secondary | ICD-10-CM | POA: Diagnosis not present

## 2017-11-20 LAB — POCT URINALYSIS DIPSTICK
BILIRUBIN UA: NEGATIVE
GLUCOSE UA: NEGATIVE
Ketones, UA: NEGATIVE
Nitrite, UA: NEGATIVE
Urobilinogen, UA: 0.2 E.U./dL
pH, UA: 5 (ref 5.0–8.0)

## 2017-11-20 MED ORDER — SULFAMETHOXAZOLE-TRIMETHOPRIM 800-160 MG PO TABS: 1.0000 | ORAL_TABLET | Freq: Two times a day (BID) | ORAL | 0 refills | Status: DC

## 2017-11-20 NOTE — Patient Instructions (Signed)
Azo Standard to help with the symptoms of dysuria.  Can take three times daily.  This will make your urine really orange.  Desitin or Aquaphor for the vulvar skin irritation/itching.

## 2017-11-20 NOTE — Progress Notes (Signed)
GYNECOLOGY  VISIT  CC:   dysuria  HPI: 82 y.o. G4P2 Married Caucasian female here for dysuria.  This has been going on about a week.  Pt has recent back surgery and is doing well but movement is still hard.  Using cane for support.  Has increase odor with urine.  Feels urine is dark as well.  Denies hematuria.  No flank pain.  Denies fever.  Accompanied by spouse today.  GYNECOLOGIC HISTORY: Patient's last menstrual period was 08/11/1974. Contraception: post menopausal Menopausal hormone therapy: none  Patient Active Problem List   Diagnosis Date Noted  . Liver lesion 11/10/2017  . Nausea & vomiting 10/31/2017  . Chronic daily headache 07/08/2017  . Memory changes 03/06/2017  . Vaginal itching 04/04/2016  . Lumbar stenosis with neurogenic claudication 03/20/2016  . Scalp pain 10/22/2015  . Post-traumatic headache 10/11/2015  . Routine health maintenance 03/06/2013  . Macular degeneration of right eye 03/03/2013  . Glaucoma 03/03/2013  . Hyperlipidemia 03/14/2009  . Depression 10/19/2007  . Essential hypertension 10/19/2007  . GASTROESOPHAGEAL REFLUX DISEASE 10/19/2007  . Osteoarthritis 10/19/2007  . STRESS INCONTINENCE 10/19/2007  . Allergic state 10/19/2007    Past Medical History:  Diagnosis Date  . Anxiety   . Colon polyps   . Complication of anesthesia    takes longer to wake up- admitted overnight after colonoscopy  . Depression   . GERD (gastroesophageal reflux disease)   . Glaucoma   . Headache    migraines  . Hx: UTI (urinary tract infection)   . Hypercholesteremia   . Hypertension   . Lichen simplex chronicus   . Lumbar stenosis with neurogenic claudication   . Macular degeneration   . Osteoarthritis   . Osteopenia    of the hip  . PONV (postoperative nausea and vomiting)   . Stress incontinence, female   . Unsteady gait   . Urinary urgency   . Wears glasses   . Whooping cough    as a baby    Past Surgical History:  Procedure Laterality Date   . ABDOMINAL HYSTERECTOMY     ovaries remain  . ANTERIOR LAT LUMBAR FUSION N/A 10/26/2017   Procedure: LUMBAR THREE- LUMBAR FOUR, LUMBAR FOUR- LUMBAR FIVE ANTEROLATERAL LUMBAR INTERBODY ARTHRODESIS;  Surgeon: Jovita Gamma, MD;  Location: [place];  Service: Neurosurgery;  Laterality: N/A;  LUMBAR 3- LUMBAR 4, LUMBAR 4- LUMBAR 5 ANTEROLATERAL LUMBAR INTERBODY ARTHRODESIS, LUMBAR 3- LUMBAR 4, LUMBAR 4- LUMBAR 5 PERCUTANEOUS PEDICLE SCREW FIXATION  . APPENDECTOMY    . BLADDER REPAIR     x two  . CHOLECYSTECTOMY    . COLONOSCOPY    . EYE SURGERY  11/14, 12/14   Macular Dengeneration, Glaucoma  . EYE SURGERY  2016   left eye  . LESION REMOVAL Right 06/28/2013   Procedure: EXCISION 3 cm right labial sebaceous cyst;  Surgeon: Lyman Speller, MD;  Location: [facility];  Service: Gynecology;  Laterality: Right;  . LUMBAR LAMINECTOMY/DECOMPRESSION MICRODISCECTOMY Right 03/20/2016   Procedure: Right - Lumbar four-five lumbar laminotomy, foraminotomy, and possible microdiscectomy;  Surgeon: Jovita Gamma, MD;  Location: [place] [facility];  Service: Neurosurgery;  Laterality: Right;  right  . LUMBAR PERCUTANEOUS PEDICLE SCREW 2 LEVEL  10/26/2017   Procedure: LUMBAR THREE- LUMBAR FOUR, LUMBAR FOUR- LUMBAR FIVE PERCUTANEOUS PEDICLE SCREW FIXATION;  Surgeon: Jovita Gamma, MD;  Location: [place];  Service: Neurosurgery;;  . MOHS SURGERY     procedure to remove basal cell  . TONSILLECTOMY AND ADENOIDECTOMY    .  URETHRAL SLING  2007  . URETHRAL SLING  1/12   midurethral   . vertebroplasty secondary to traumatic compression fracture      MEDS:   Current Outpatient Medications on File Prior to Visit  Medication Sig Dispense Refill  . acetaminophen (TYLENOL) 500 MG tablet Take 500 mg by mouth daily as needed for mild pain or headache.     . ALPRAZolam (XANAX) 0.5 MG tablet Take 0.5-1 tablets (0.25-0.5 mg total) by mouth 2 (two) times daily as needed. for anxiety 20 tablet 0  . aspirin EC 81 MG tablet Take  81 mg by mouth daily.    . brimonidine-timolol (COMBIGAN) 0.2-0.5 % ophthalmic solution Place 1 drop into both eyes every 12 (twelve) hours.    . clobetasol ointment (TEMOVATE) 0.05 % Apply topically 2 (two) times daily. Do not use for more than 7 days in a row. 30 g 1  . ezetimibe-simvastatin (VYTORIN) 10-20 MG tablet Take 1 tablet by mouth daily. (Patient taking differently: Take 1 tablet by mouth daily at 6 PM. ) 90 tablet 3  . ibuprofen (ADVIL,MOTRIN) 200 MG tablet Take 200 mg by mouth every 8 (eight) hours as needed.    . nadolol (CORGARD) 40 MG tablet take 1 tablet by mouth once daily at bedtime (Patient taking differently: take 1 tablet (40mg ) by mouth once daily at bedtime) 90 tablet 1  . omeprazole (PRILOSEC) 20 MG capsule Take 20 mg by mouth daily before breakfast.    . sodium chloride (OCEAN) 0.65 % SOLN nasal spray Place 1 spray into both nostrils 2 (two) times daily as needed for congestion.    . traZODone (DESYREL) 100 MG tablet TAKE 1/4 TO 1/2 TABLET BY MOUTH AT BEDTIME IF NEEDED FOR SLEEP (Patient taking differently: TAKE 1/4 TABLET (25 MG) BY MOUTH DAILY AT BEDTIME) 15 tablet 1  . venlafaxine XR (EFFEXOR-XR) 150 MG 24 hr capsule take 1 capsule by mouth twice a day (Patient taking differently: take 1 capsule (150mg ) by mouth twice a day) 60 capsule 3   No current facility-administered medications on file prior to visit.     ALLERGIES: Codeine and Shingrix [zoster vac recomb adjuvanted]  Family History  Problem Relation Age of Onset  . Congestive Heart Failure Mother   . Thyroid disease Mother   . Osteoporosis Mother   . Alcoholism Mother   . Prostate cancer Father   . Breast cancer Maternal Grandmother   . Hypertension Sister   . Thyroid disease Sister   . Cancer Sister        brain ?  Marland Kitchen Rheum arthritis Daughter     SH:  Married, non smoker  Review of Systems  Genitourinary: Positive for dysuria.  All other systems reviewed and are negative.   PHYSICAL  EXAMINATION:    BP 138/70 (BP Location: Right Arm, Patient Position: Sitting, Cuff Size: Normal)   Pulse 68   Temp 97.6 F (36.4 C) (Oral)   Resp 16   Ht 5' (1.524 m)   Wt 160 lb (72.6 kg)   LMP 08/11/1974   BMI 31.25 kg/m     General appearance: alert, cooperative and appears stated age Abdomen: soft, non-tender; bowel sounds normal; no masses,  no organomegaly  Pelvic: External genitalia:  no lesions              Urethra:  normal appearing urethra with no masses, tenderness or lesions              Bartholins and Skenes: normal  Vagina: normal appearing vagina with normal color and discharge, no lesions              Cervix: absent              Bimanual Exam:  Uterus:  uterus absent              Adnexa: no mass, fullness, tenderness  Chaperone was present for exam.  Assessment: Dysuria, likely UTI H/o recurrent UTIs in the past SUI  Plan: Urine culture and micro pending Rx for Bactrim DS BID x 5 days to pharmacy.  Urine culture results will be called to pt.

## 2017-11-21 LAB — URINALYSIS, MICROSCOPIC ONLY
Casts: NONE SEEN /lpf
WBC, UA: >30 /hpf — AB (ref 0–5)

## 2017-11-23 ENCOUNTER — Telehealth: Payer: Self-pay

## 2017-11-23 NOTE — Telephone Encounter (Signed)
Copied from [place] [phone] [phone]. Topic: General - Other >> Nov 23, 2017 10:13 AM Carolyn Stare wrote:  Husband call to say he think pt need to have PT and he notice when he and his wife was there that there was PT on staff and he is asking if Dr Yong Channel thinks it will be good for his wife to have. He said she fell 3 times on Saturday . She had back surgery 4 weeks ago  [phone]       Please advise

## 2017-11-23 NOTE — Telephone Encounter (Signed)
May refer to PT under back pain

## 2017-11-24 ENCOUNTER — Other Ambulatory Visit: Payer: Self-pay

## 2017-11-24 DIAGNOSIS — M549 Dorsalgia, unspecified: Secondary | ICD-10-CM

## 2017-11-24 LAB — URINE CULTURE

## 2017-11-24 NOTE — Telephone Encounter (Signed)
Referral to PT was sent in.

## 2017-11-30 ENCOUNTER — Ambulatory Visit: Payer: Medicare Other

## 2017-12-02 ENCOUNTER — Ambulatory Visit: Payer: Medicare Other

## 2017-12-07 ENCOUNTER — Ambulatory Visit: Payer: Medicare Other | Attending: Neurosurgery | Admitting: Physical Therapy

## 2017-12-07 ENCOUNTER — Encounter: Payer: Self-pay | Admitting: Physical Therapy

## 2017-12-07 DIAGNOSIS — M6281 Muscle weakness (generalized): Secondary | ICD-10-CM

## 2017-12-07 DIAGNOSIS — Z9181 History of falling: Secondary | ICD-10-CM

## 2017-12-07 DIAGNOSIS — R2681 Unsteadiness on feet: Secondary | ICD-10-CM | POA: Diagnosis present

## 2017-12-07 DIAGNOSIS — M545 Low back pain, unspecified: Secondary | ICD-10-CM

## 2017-12-07 DIAGNOSIS — G8929 Other chronic pain: Secondary | ICD-10-CM | POA: Diagnosis present

## 2017-12-07 NOTE — Therapy (Signed)
[facility] [address], [place], [place], [address] Phone: [phone]   Fax:  [phone]  Physical Therapy Evaluation  Patient Details  Name: [name] MRN: [id number] Date of Birth: 02/22/1936 Referring Provider: Jovita Gamma, MD   Encounter Date: 12/07/2017  PT End of Session - 12/07/17 1536    Visit Number  1    Number of Visits  16    Date for PT Re-Evaluation  02/01/18    Authorization Type  [employer] Medicare    PT Start Time  1440    PT Stop Time  1523    PT Time Calculation (min)  43 min    Equipment Utilized During Treatment  Back brace    Activity Tolerance  Patient tolerated treatment well    Behavior During Therapy  [facility] for tasks assessed/performed       Past Medical History:  Diagnosis Date  . Anxiety   . Colon polyps   . Complication of anesthesia    takes longer to wake up- admitted overnight after colonoscopy  . Depression   . GERD (gastroesophageal reflux disease)   . Glaucoma   . Headache    migraines  . Hx: UTI (urinary tract infection)   . Hypercholesteremia   . Hypertension   . Lichen simplex chronicus   . Lumbar stenosis with neurogenic claudication   . Macular degeneration   . Osteoarthritis   . Osteopenia    of the hip  . PONV (postoperative nausea and vomiting)   . Stress incontinence, female   . Unsteady gait   . Urinary urgency   . Wears glasses   . Whooping cough    as a baby    Past Surgical History:  Procedure Laterality Date  . ABDOMINAL HYSTERECTOMY     ovaries remain  . ANTERIOR LAT LUMBAR FUSION N/A 10/26/2017   Procedure: LUMBAR THREE- LUMBAR FOUR, LUMBAR FOUR- LUMBAR FIVE ANTEROLATERAL LUMBAR INTERBODY ARTHRODESIS;  Surgeon: Jovita Gamma, MD;  Location: [place];  Service: Neurosurgery;  Laterality: N/A;  LUMBAR 3- LUMBAR 4, LUMBAR 4- LUMBAR 5 ANTEROLATERAL LUMBAR INTERBODY ARTHRODESIS, LUMBAR 3- LUMBAR 4, LUMBAR 4- LUMBAR 5 PERCUTANEOUS PEDICLE SCREW  FIXATION  . APPENDECTOMY    . BLADDER REPAIR     x two  . CHOLECYSTECTOMY    . COLONOSCOPY    . EYE SURGERY  11/14, 12/14   Macular Dengeneration, Glaucoma  . EYE SURGERY  2016   left eye  . LESION REMOVAL Right 06/28/2013   Procedure: EXCISION 3 cm right labial sebaceous cyst;  Surgeon: Lyman Speller, MD;  Location: [facility];  Service: Gynecology;  Laterality: Right;  . LUMBAR LAMINECTOMY/DECOMPRESSION MICRODISCECTOMY Right 03/20/2016   Procedure: Right - Lumbar four-five lumbar laminotomy, foraminotomy, and possible microdiscectomy;  Surgeon: Jovita Gamma, MD;  Location: [place];  Service: Neurosurgery;  Laterality: Right;  right  . LUMBAR PERCUTANEOUS PEDICLE SCREW 2 LEVEL  10/26/2017   Procedure: LUMBAR THREE- LUMBAR FOUR, LUMBAR FOUR- LUMBAR FIVE PERCUTANEOUS PEDICLE SCREW FIXATION;  Surgeon: Jovita Gamma, MD;  Location: [place];  Service: Neurosurgery;;  . MOHS SURGERY     procedure to remove basal cell  . TONSILLECTOMY AND ADENOIDECTOMY    . URETHRAL SLING  2007  . URETHRAL SLING  1/12   midurethral   . vertebroplasty secondary to traumatic compression fracture      There were no vitals filed for this visit.   Subjective Assessment - 12/07/17 1443    Subjective  Pt is an 82 y/o female who presents to OPPT s/p L 3-5 ALIF on 10/26/17.   Pt presents today with continued difficulty with Rt sided LBP, and no radicular symptoms at this time.  Pt wearing LSO and is to be wearing when OOB until at least June per MD orders.    Limitations  -- pt in constant pain    Patient Stated Goals  improve pain    Currently in Pain?  Yes    Pain Score  6  constant at 6/10    Pain Location  Back    Pain Orientation  Right    Pain Descriptors / Indicators  Stabbing    Pain Type  Chronic pain;Surgical pain    Pain Onset  More than a month ago 2 years    Pain Frequency  Constant    Aggravating Factors   unknown, pain stays at a 6/10    Pain Relieving Factors  unknown          [facility] PT Assessment - 12/07/17 1449      Assessment   Medical Diagnosis  Spondylosis without myelopathy or radiculopathy, lumbar region    Referring Provider  Jovita Gamma, MD    Onset Date/Surgical Date  10/26/17    Next MD Visit  01/11/18    Prior Therapy  none      Precautions   Precautions  Back    Required Braces or Orthoses  Spinal Brace    Spinal Brace  Lumbar corset;Applied in sitting position      Restrictions   Weight Bearing Restrictions  No      Balance Screen   Has the patient fallen in the past 6 months  Yes    How many times?  5-all in one weekend (2-3 weeks ago)    Has the patient had a decrease in activity level because of a fear of falling?   Yes    Is the patient reluctant to leave their home because of a fear of falling?   No      Home Environment   Living Environment  Private residence    Living Arrangements  Spouse/significant other    Type of [place] to enter    Entrance Stairs-Number of Steps  2    [place]  One level    [place] - 2 wheels;Bedside commode      Prior Function   Level of Independence  Independent    Vocation  Retired    Leisure  play sudoku/puzzles on iphone, watch TV, no regular exercise      Cognition   Overall Cognitive Status  No family/caregiver present to determine baseline cognitive functioning    Memory  Impaired    Memory Impairment  Decreased recall of new information;Decreased short term memory    Decreased Short Term Memory  Verbal basic      Observation/Other Assessments   Skin Integrity  healing bil incisions along lower thoracic/lumbar spine    Focus on Therapeutic Outcomes (FOTO)   31 (69% limited; predicted 54% limited)      Posture/Postural Control   Posture/Postural Control  Postural limitations    Postural Limitations  Rounded Shoulders;Forward head;Decreased lumbar lordosis      ROM / Strength   AROM / PROM /  Strength  AROM;Strength      AROM   Overall AROM Comments  deferred  Strength   Overall Strength Comments  tested in sitting only due to post-op status; suspect hip extension and abduction weakness due to functional limitations and gait abnormalities    Strength Assessment Site  Hip;Knee;Ankle    Right/Left Hip  Right;Left    Right Hip Flexion  3/5    Left Hip Flexion  4/5    Right/Left Knee  Right;Left    Right Knee Flexion  3/5    Right Knee Extension  4-/5    Left Knee Flexion  4/5    Left Knee Extension  5/5    Right/Left Ankle  Right;Left    Right Ankle Dorsiflexion  3/5    Left Ankle Dorsiflexion  3+/5      Flexibility   Soft Tissue Assessment /Muscle Length  yes    Hamstrings  tightness bil    Piriformis  tightness bil      Palpation   Palpation comment  tenderness Rt SIJ and glutes      Bed Mobility   Bed Mobility  Sit to Sidelying Left    Sit to Sidelying Left  5: Supervision    Sit to Sidelying Left Details (indicate cue type and reason)  cues needed for safe technique for sit to supine      Transfers   Five time sit to stand comments   23 sec with UE support; unable to stand without UE support      Ambulation/Gait   Ambulation/Gait  Yes    Ambulation/Gait Assistance  4: Min guard    Ambulation Distance (Feet)  100 Feet    Assistive device  Straight cane    Gait Pattern  Step-to pattern;Decreased stance time - right;Decreased step length - left;Trendelenburg;Lateral hip instability;Wide base of support    Ambulation Surface  Level;Indoor    Gait Comments  pt using cane in RUE with poor sequencing; attempted to switch to LUE which pt able to walk 25' with poor sequencing, then once sitting down switched back to RUE      Standardized Balance Assessment   Standardized Balance Assessment  Timed Up and Go Test      Timed Up and Go Test   Normal TUG (seconds)  27.82 with cane                Objective measurements completed on examination: See  above findings.      [facility] Adult PT Treatment/Exercise - 12/07/17 1449      Self-Care   Self-Care  Other Self-Care Comments    Other Self-Care Comments   instructed in hamstring stretch and pelvic tilts; pt performed 1-3 reps of each with max cues for proper technique             PT Education - 12/07/17 1523    Education provided  Yes    Education Details  HEP    Person(s) Educated  Patient    Methods  Explanation;Demonstration;Handout    Comprehension  Verbalized understanding;Returned demonstration;Need further instruction       PT Short Term Goals - 12/07/17 1545      PT SHORT TERM GOAL #1   Title  verbalize understanding of posture/body mechanics to decrease risk of reinjury    Status  New    Target Date  01/04/18      PT SHORT TERM GOAL #2   Title  improve functional strength by demonstrating ability to stand without UE support at least 3/5 trials    Status  New    Target  Date  01/04/18      PT SHORT TERM GOAL #3   Title  improve timed up and go to < 20 sec for improved functional mobility    Status  New    Target Date  01/04/18        PT Long Term Goals - 12/07/17 1544      PT LONG TERM GOAL #1   Title  independent with HEP    Status  New    Target Date  01/18/18      PT LONG TERM GOAL #2   Title  demonstrate improved functional strength by performing 5x STS in < 20 sec withou UE support    Status  New    Target Date  02/01/18      PT LONG TERM GOAL #3   Title  improve timed up and go to < 17 sec with LRAD for improved function    Status  New    Target Date  02/01/18      PT LONG TERM GOAL #4   Title  amb > 250' with LRAD on various indoor/outdoor surfaces modified independently for improved function    Status  New    Target Date  02/01/18      PT LONG TERM GOAL #5   Title  report pain < 4/10 with activity for improved functional mobility    Status  New    Target Date  02/01/18             Plan - 12/07/17 1537    Clinical  Impression Statement  Pt is an 82 y/o female who presents to [facility] s/p L3-5 XLIF on 10/26/17.  Pt presents today with significant gait abnormalities affecting balance, and decreased strength and flexibility affecting safe, functional mobility.  Pt with some cognitive deficits noted during eval, initially stating she did ADLs without assistance, then later stating husband helped with ADLs.  Will benefit from PT to address deficits listed.    History and Personal Factors relevant to plan of care:  anxiety, depression, glaucoma, macular degeneration, OA, osteopenia, incontinence    Clinical Presentation  Evolving    Clinical Presentation due to:  persistent pain following surgery, reports 5 falls in one weekend ~ 2-3 weeks ago    Clinical Decision Making  Moderate    Rehab Potential  Good    PT Frequency  2x / week    PT Duration  6 weeks    PT Treatment/Interventions  ADLs/Self Care Home Management;Cryotherapy;Electrical Stimulation;Moist Heat;Traction;Therapeutic exercise;Therapeutic activities;Functional mobility training;Stair training;Gait training;DME Instruction;Ultrasound;Balance training;Neuromuscular re-education;Patient/family education;Manual techniques;Taping;Dry needling    PT Next Visit Plan  review HEP and add to program; core/hip strengthening maintaining neutral spine, manual/modalities PRN    PT Home Exercise Plan  Access Code: [email or web address]    Consulted and Agree with Plan of Care  Patient       Patient will benefit from skilled therapeutic intervention in order to improve the following deficits and impairments:  Abnormal gait, Decreased balance, Decreased cognition, Decreased knowledge of use of DME, Decreased mobility, Decreased safety awareness, Impaired flexibility, Postural dysfunction, Improper body mechanics, Impaired perceived functional ability, Difficulty walking, Increased muscle spasms, Pain, Increased fascial restricitons, Decreased strength, Decreased endurance  Visit  Diagnosis: Chronic right-sided low back pain without sciatica - Plan: PT plan of care cert/re-cert  Muscle weakness (generalized) - Plan: PT plan of care cert/re-cert  Unsteadiness on feet - Plan: PT plan of care cert/re-cert  History of falling - Plan: PT plan  of care cert/re-cert     Problem List Patient Active Problem List   Diagnosis Date Noted  . Liver lesion 11/10/2017  . Nausea & vomiting 10/31/2017  . Chronic daily headache 07/08/2017  . Memory changes 03/06/2017  . Vaginal itching 04/04/2016  . Lumbar stenosis with neurogenic claudication 03/20/2016  . Scalp pain 10/22/2015  . Post-traumatic headache 10/11/2015  . Routine health maintenance 03/06/2013  . Macular degeneration of right eye 03/03/2013  . Glaucoma 03/03/2013  . Hyperlipidemia 03/14/2009  . Depression 10/19/2007  . Essential hypertension 10/19/2007  . GASTROESOPHAGEAL REFLUX DISEASE 10/19/2007  . Osteoarthritis 10/19/2007  . STRESS INCONTINENCE 10/19/2007  . Allergic state 10/19/2007      Laureen Abrahams, PT, DPT 12/07/17 3:56 PM     [facility] [address], [place], [place], [address] Phone: [phone]   Fax:  [phone]  Name: [name] MRN: [id number] Date of Birth: 11/30/1935

## 2017-12-07 NOTE — Patient Instructions (Signed)
Access Code: [email or web address]  URL: [email or web address]  Date: 12/07/2017  Prepared by: Faustino Congress   Exercises  Supine Posterior Pelvic Tilt - 10 reps - 1 sets - 5 sec hold - 2x daily - 7x weekly  Seated Hamstring Stretch - 3 reps - 1 sets - 30 sec hold - 2x daily - 7x weekly

## 2017-12-09 ENCOUNTER — Ambulatory Visit: Payer: Medicare Other | Attending: Neurosurgery | Admitting: Physical Therapy

## 2017-12-09 ENCOUNTER — Encounter: Payer: Self-pay | Admitting: Physical Therapy

## 2017-12-09 DIAGNOSIS — M545 Low back pain: Secondary | ICD-10-CM | POA: Diagnosis not present

## 2017-12-09 DIAGNOSIS — Z9181 History of falling: Secondary | ICD-10-CM | POA: Insufficient documentation

## 2017-12-09 DIAGNOSIS — M6281 Muscle weakness (generalized): Secondary | ICD-10-CM | POA: Diagnosis present

## 2017-12-09 DIAGNOSIS — G8929 Other chronic pain: Secondary | ICD-10-CM | POA: Diagnosis present

## 2017-12-09 DIAGNOSIS — R2681 Unsteadiness on feet: Secondary | ICD-10-CM | POA: Diagnosis present

## 2017-12-09 NOTE — Therapy (Signed)
[facility] [address], [place], [place], [address] Phone: [phone]   Fax:  [phone]  Physical Therapy Treatment  Patient Details  Name: [name] MRN: [id number] Date of Birth: 03-31-1936 Referring Provider: Jovita Gamma, MD   Encounter Date: 12/09/2017  PT End of Session - 12/09/17 1307    Visit Number  2    Number of Visits  16    Date for PT Re-Evaluation  02/01/18    Authorization Type  [employer]HC Medicare    PT Start Time  1230    PT Stop Time  1308    PT Time Calculation (min)  38 min    Equipment Utilized During Treatment  Back brace    Activity Tolerance  Patient tolerated treatment well    Behavior During Therapy  [facility] for tasks assessed/performed       Past Medical History:  Diagnosis Date  . Anxiety   . Colon polyps   . Complication of anesthesia    takes longer to wake up- admitted overnight after colonoscopy  . Depression   . GERD (gastroesophageal reflux disease)   . Glaucoma   . Headache    migraines  . Hx: UTI (urinary tract infection)   . Hypercholesteremia   . Hypertension   . Lichen simplex chronicus   . Lumbar stenosis with neurogenic claudication   . Macular degeneration   . Osteoarthritis   . Osteopenia    of the hip  . PONV (postoperative nausea and vomiting)   . Stress incontinence, female   . Unsteady gait   . Urinary urgency   . Wears glasses   . Whooping cough    as a baby    Past Surgical History:  Procedure Laterality Date  . ABDOMINAL HYSTERECTOMY     ovaries remain  . ANTERIOR LAT LUMBAR FUSION N/A 10/26/2017   Procedure: LUMBAR THREE- LUMBAR FOUR, LUMBAR FOUR- LUMBAR FIVE ANTEROLATERAL LUMBAR INTERBODY ARTHRODESIS;  Surgeon: Jovita Gamma, MD;  Location: [place];  Service: Neurosurgery;  Laterality: N/A;  LUMBAR 3- LUMBAR 4, LUMBAR 4- LUMBAR 5 ANTEROLATERAL LUMBAR INTERBODY ARTHRODESIS, LUMBAR 3- LUMBAR 4, LUMBAR 4- LUMBAR 5 PERCUTANEOUS PEDICLE SCREW  FIXATION  . APPENDECTOMY    . BLADDER REPAIR     x two  . CHOLECYSTECTOMY    . COLONOSCOPY    . EYE SURGERY  11/14, 12/14   Macular Dengeneration, Glaucoma  . EYE SURGERY  2016   left eye  . LESION REMOVAL Right 06/28/2013   Procedure: EXCISION 3 cm right labial sebaceous cyst;  Surgeon: Lyman Speller, MD;  Location: [facility];  Service: Gynecology;  Laterality: Right;  . LUMBAR LAMINECTOMY/DECOMPRESSION MICRODISCECTOMY Right 03/20/2016   Procedure: Right - Lumbar four-five lumbar laminotomy, foraminotomy, and possible microdiscectomy;  Surgeon: Jovita Gamma, MD;  Location: [place] [facility];  Service: Neurosurgery;  Laterality: Right;  right  . LUMBAR PERCUTANEOUS PEDICLE SCREW 2 LEVEL  10/26/2017   Procedure: LUMBAR THREE- LUMBAR FOUR, LUMBAR FOUR- LUMBAR FIVE PERCUTANEOUS PEDICLE SCREW FIXATION;  Surgeon: Jovita Gamma, MD;  Location: [place];  Service: Neurosurgery;;  . MOHS SURGERY     procedure to remove basal cell  . TONSILLECTOMY AND ADENOIDECTOMY    . URETHRAL SLING  2007  . URETHRAL SLING  1/12   midurethral   . vertebroplasty secondary to traumatic compression fracture      There were no vitals filed for this visit.  Subjective Assessment - 12/09/17 1231    Subjective  doing okay, reports she did exercises at home.  "they went okay"    Patient Stated Goals  improve pain    Currently in Pain?  No/denies                       [facility] Adult PT Treatment/Exercise - 12/09/17 1234      Ambulation/Gait   Ambulation/Gait  Yes    Ambulation/Gait Assistance  4: Min guard    Ambulation Distance (Feet)  150 Feet    Assistive device  Straight cane    Gait Comments  cues for sequencing and proper technique; improved with repetition      Exercises   Exercises  Lumbar      Lumbar Exercises: Stretches   Passive Hamstring Stretch  Right;Left;3 reps;30 seconds    Passive Hamstring Stretch Limitations  seated      Lumbar Exercises: Aerobic   Nustep  L3 x 6 min;  2 rest breaks needed during      Lumbar Exercises: Supine   Pelvic Tilt  10 reps;5 seconds    Clam  15 reps    Clam Limitations  red theraband    Bridge  15 reps;5 seconds    Straight Leg Raise  15 reps             PT Education - 12/09/17 1306    Education provided  Yes    Education Details  HEP    Person(s) Educated  Patient    Methods  Explanation;Demonstration;Verbal cues;Handout    Comprehension  Verbalized understanding;Returned demonstration;Need further instruction       PT Short Term Goals - 12/07/17 1545      PT SHORT TERM GOAL #1   Title  verbalize understanding of posture/body mechanics to decrease risk of reinjury    Status  New    Target Date  01/04/18      PT SHORT TERM GOAL #2   Title  improve functional strength by demonstrating ability to stand without UE support at least 3/5 trials    Status  New    Target Date  01/04/18      PT SHORT TERM GOAL #3   Title  improve timed up and go to < 20 sec for improved functional mobility    Status  New    Target Date  01/04/18        PT Long Term Goals - 12/07/17 1544      PT LONG TERM GOAL #1   Title  independent with HEP    Status  New    Target Date  01/18/18      PT LONG TERM GOAL #2   Title  demonstrate improved functional strength by performing 5x STS in < 20 sec withou UE support    Status  New    Target Date  02/01/18      PT LONG TERM GOAL #3   Title  improve timed up and go to < 17 sec with LRAD for improved function    Status  New    Target Date  02/01/18      PT LONG TERM GOAL #4   Title  amb > 250' with LRAD on various indoor/outdoor surfaces modified independently for improved function    Status  New    Target Date  02/01/18      PT LONG TERM GOAL #5   Title  report pain < 4/10 with activity for improved functional mobility    Status  New    Target Date  02/01/18            Plan - 12/09/17 1307    Clinical Impression Statement  Pt needed min cues for current HEP and  added additional exercises today with min cues needed.  Improved sequencing with cane in LUE today with cues.  Will continue to benefit from PT to maximize function.    PT Treatment/Interventions  ADLs/Self Care Home Management;Cryotherapy;Electrical Stimulation;Moist Heat;Traction;Therapeutic exercise;Therapeutic activities;Functional mobility training;Stair training;Gait training;DME Instruction;Ultrasound;Balance training;Neuromuscular re-education;Patient/family education;Manual techniques;Taping;Dry needling    PT Next Visit Plan  review HEP and add to program; core/hip strengthening maintaining neutral spine, manual/modalities PRN    PT Home Exercise Plan  Access Code: [email or web address]    Consulted and Agree with Plan of Care  Patient       Patient will benefit from skilled therapeutic intervention in order to improve the following deficits and impairments:  Abnormal gait, Decreased balance, Decreased cognition, Decreased knowledge of use of DME, Decreased mobility, Decreased safety awareness, Impaired flexibility, Postural dysfunction, Improper body mechanics, Impaired perceived functional ability, Difficulty walking, Increased muscle spasms, Pain, Increased fascial restricitons, Decreased strength, Decreased endurance  Visit Diagnosis: Chronic right-sided low back pain without sciatica  Muscle weakness (generalized)  Unsteadiness on feet  History of falling     Problem List Patient Active Problem List   Diagnosis Date Noted  . Liver lesion 11/10/2017  . Nausea & vomiting 10/31/2017  . Chronic daily headache 07/08/2017  . Memory changes 03/06/2017  . Vaginal itching 04/04/2016  . Lumbar stenosis with neurogenic claudication 03/20/2016  . Scalp pain 10/22/2015  . Post-traumatic headache 10/11/2015  . Routine health maintenance 03/06/2013  . Macular degeneration of right eye 03/03/2013  . Glaucoma 03/03/2013  . Hyperlipidemia 03/14/2009  . Depression 10/19/2007  . Essential  hypertension 10/19/2007  . GASTROESOPHAGEAL REFLUX DISEASE 10/19/2007  . Osteoarthritis 10/19/2007  . STRESS INCONTINENCE 10/19/2007  . Allergic state 10/19/2007      Laureen Abrahams, PT, DPT 12/09/17 1:30 PM    [facility] [address] [place], [place], [address] Phone: [phone]   Fax:  [phone]  Name: [name] MRN: [id number] Date of Birth: 04-13-1936

## 2017-12-09 NOTE — Patient Instructions (Signed)
Access Code: [email or web address]  URL: [email or web address]  Date: 12/09/2017  Prepared by: Faustino Congress   Exercises  Supine Posterior Pelvic Tilt - 10 reps - 1 sets - 5 sec hold - 2x daily - 7x weekly  Seated Hamstring Stretch - 3 reps - 1 sets - 30 sec hold - 2x daily - 7x weekly  Bent Knee Fallouts - 15 reps - 1 sets - 2x daily - 7x weekly  Bridge with Resistance - 15 reps - 1 sets - 5 sec hold - 2x daily - 7x weekly  [place] Straight Leg Raise - 15 reps - 1 sets - 2x daily - 7x weekly

## 2017-12-14 ENCOUNTER — Ambulatory Visit: Payer: Medicare Other

## 2017-12-14 DIAGNOSIS — Z9181 History of falling: Secondary | ICD-10-CM

## 2017-12-14 DIAGNOSIS — R2681 Unsteadiness on feet: Secondary | ICD-10-CM

## 2017-12-14 DIAGNOSIS — M6281 Muscle weakness (generalized): Secondary | ICD-10-CM

## 2017-12-14 DIAGNOSIS — G8929 Other chronic pain: Secondary | ICD-10-CM

## 2017-12-14 DIAGNOSIS — M545 Low back pain, unspecified: Secondary | ICD-10-CM

## 2017-12-14 NOTE — Therapy (Signed)
[facility] [address] [place], [place], [address] Phone: [phone]   Fax:  [phone]  Physical Therapy Treatment  Patient Details  Name: [name] MRN: [id number] Date of Birth: October 27, 1935 Referring Provider: Jovita Gamma, MD   Encounter Date: 12/14/2017  PT End of Session - 12/14/17 1522    Visit Number  3    Number of Visits  16    Date for PT Re-Evaluation  02/01/18    Authorization Type  UHC Medicare    PT Start Time  1448    PT Stop Time  1529    PT Time Calculation (min)  41 min    Activity Tolerance  Patient tolerated treatment well    Behavior During Therapy  [facility] for tasks assessed/performed       Past Medical History:  Diagnosis Date  . Anxiety   . Colon polyps   . Complication of anesthesia    takes longer to wake up- admitted overnight after colonoscopy  . Depression   . GERD (gastroesophageal reflux disease)   . Glaucoma   . Headache    migraines  . Hx: UTI (urinary tract infection)   . Hypercholesteremia   . Hypertension   . Lichen simplex chronicus   . Lumbar stenosis with neurogenic claudication   . Macular degeneration   . Osteoarthritis   . Osteopenia    of the hip  . PONV (postoperative nausea and vomiting)   . Stress incontinence, female   . Unsteady gait   . Urinary urgency   . Wears glasses   . Whooping cough    as a baby    Past Surgical History:  Procedure Laterality Date  . ABDOMINAL HYSTERECTOMY     ovaries remain  . ANTERIOR LAT LUMBAR FUSION N/A 10/26/2017   Procedure: LUMBAR THREE- LUMBAR FOUR, LUMBAR FOUR- LUMBAR FIVE ANTEROLATERAL LUMBAR INTERBODY ARTHRODESIS;  Surgeon: Jovita Gamma, MD;  Location: [place];  Service: Neurosurgery;  Laterality: N/A;  LUMBAR 3- LUMBAR 4, LUMBAR 4- LUMBAR 5 ANTEROLATERAL LUMBAR INTERBODY ARTHRODESIS, LUMBAR 3- LUMBAR 4, LUMBAR 4- LUMBAR 5 PERCUTANEOUS PEDICLE SCREW FIXATION  . APPENDECTOMY    . BLADDER REPAIR     x  two  . CHOLECYSTECTOMY    . COLONOSCOPY    . EYE SURGERY  11/14, 12/14   Macular Dengeneration, Glaucoma  . EYE SURGERY  2016   left eye  . LESION REMOVAL Right 06/28/2013   Procedure: EXCISION 3 cm right labial sebaceous cyst;  Surgeon: Lyman Speller, MD;  Location: [facility];  Service: Gynecology;  Laterality: Right;  . LUMBAR LAMINECTOMY/DECOMPRESSION MICRODISCECTOMY Right 03/20/2016   Procedure: Right - Lumbar four-five lumbar laminotomy, foraminotomy, and possible microdiscectomy;  Surgeon: Jovita Gamma, MD;  Location: [place] [facility];  Service: Neurosurgery;  Laterality: Right;  right  . LUMBAR PERCUTANEOUS PEDICLE SCREW 2 LEVEL  10/26/2017   Procedure: LUMBAR THREE- LUMBAR FOUR, LUMBAR FOUR- LUMBAR FIVE PERCUTANEOUS PEDICLE SCREW FIXATION;  Surgeon: Jovita Gamma, MD;  Location: [place];  Service: Neurosurgery;;  . MOHS SURGERY     procedure to remove basal cell  . TONSILLECTOMY AND ADENOIDECTOMY    . URETHRAL SLING  2007  . URETHRAL SLING  1/12   midurethral   . vertebroplasty secondary to traumatic compression fracture      There were no vitals filed for this visit.  Subjective Assessment - 12/14/17 1454    Subjective  I did my exercises over the weekend.  I've been  busy today.  I'm a little dizzy today and not sure why.      Currently in Pain?  No/denies                       [facility] Adult PT Treatment/Exercise - 12/14/17 0001      Lumbar Exercises: Stretches   Passive Hamstring Stretch  Right;Left;3 reps;30 seconds    Passive Hamstring Stretch Limitations  seated      Lumbar Exercises: Aerobic   Nustep  Level 2 x 10 minutes  PT present to discuss progress      Lumbar Exercises: Supine   Pelvic Tilt  10 reps;5 seconds    Clam  15 reps    Clam Limitations  red theraband    Straight Leg Raise  20 reps focus on abdominal bracing    Other Supine Lumbar Exercises  horizontal abduction 2x10 with abdominal bracing      Lumbar Exercises: Sidelying    Clam  Both;20 reps               PT Short Term Goals - 12/07/17 1545      PT SHORT TERM GOAL #1   Title  verbalize understanding of posture/body mechanics to decrease risk of reinjury    Status  New    Target Date  01/04/18      PT SHORT TERM GOAL #2   Title  improve functional strength by demonstrating ability to stand without UE support at least 3/5 trials    Status  New    Target Date  01/04/18      PT SHORT TERM GOAL #3   Title  improve timed up and go to < 20 sec for improved functional mobility    Status  New    Target Date  01/04/18        PT Long Term Goals - 12/07/17 1544      PT LONG TERM GOAL #1   Title  independent with HEP    Status  New    Target Date  01/18/18      PT LONG TERM GOAL #2   Title  demonstrate improved functional strength by performing 5x STS in < 20 sec withou UE support    Status  New    Target Date  02/01/18      PT LONG TERM GOAL #3   Title  improve timed up and go to < 17 sec with LRAD for improved function    Status  New    Target Date  02/01/18      PT LONG TERM GOAL #4   Title  amb > 250' with LRAD on various indoor/outdoor surfaces modified independently for improved function    Status  New    Target Date  02/01/18      PT LONG TERM GOAL #5   Title  report pain < 4/10 with activity for improved functional mobility    Status  New    Target Date  02/01/18            Plan - 12/14/17 1456    Clinical Impression Statement  Pt arrived without her brace.  PT focused on seated and supine exercise for safety without the brace.  Pt with hip and core weakness and tolerated all exercises well to address this.  Pt required verbal cues for core activation with exercise today.  Pt will conitinue to benefit from skilled PT for endurance, strength and balance.  Rehab Potential  Good    PT Frequency  2x / week    PT Duration  6 weeks    PT Treatment/Interventions  ADLs/Self Care Home Management;Cryotherapy;Electrical  Stimulation;Moist Heat;Traction;Therapeutic exercise;Therapeutic activities;Functional mobility training;Stair training;Gait training;DME Instruction;Ultrasound;Balance training;Neuromuscular re-education;Patient/family education;Manual techniques;Taping;Dry needling    PT Next Visit Plan  review HEP and add to program; core/hip strengthening maintaining neutral spine, manual/modalities PRN    PT Home Exercise Plan  Access Code: [email or web address]    Consulted and Agree with Plan of Care  Patient       Patient will benefit from skilled therapeutic intervention in order to improve the following deficits and impairments:  Abnormal gait, Decreased balance, Decreased cognition, Decreased knowledge of use of DME, Decreased mobility, Decreased safety awareness, Impaired flexibility, Postural dysfunction, Improper body mechanics, Impaired perceived functional ability, Difficulty walking, Increased muscle spasms, Pain, Increased fascial restricitons, Decreased strength, Decreased endurance  Visit Diagnosis: Chronic right-sided low back pain without sciatica  Muscle weakness (generalized)  Unsteadiness on feet  History of falling     Problem List Patient Active Problem List   Diagnosis Date Noted  . Liver lesion 11/10/2017  . Nausea & vomiting 10/31/2017  . Chronic daily headache 07/08/2017  . Memory changes 03/06/2017  . Vaginal itching 04/04/2016  . Lumbar stenosis with neurogenic claudication 03/20/2016  . Scalp pain 10/22/2015  . Post-traumatic headache 10/11/2015  . Routine health maintenance 03/06/2013  . Macular degeneration of right eye 03/03/2013  . Glaucoma 03/03/2013  . Hyperlipidemia 03/14/2009  . Depression 10/19/2007  . Essential hypertension 10/19/2007  . GASTROESOPHAGEAL REFLUX DISEASE 10/19/2007  . Osteoarthritis 10/19/2007  . STRESS INCONTINENCE 10/19/2007  . Allergic state 10/19/2007    Sigurd Sos, PT 12/14/17 3:29 PM  [facility] [address], [place], [place], [address] Phone: [phone]   Fax:  [phone]  Name: [name] MRN: [id number] Date of Birth: Jul 09, 1936

## 2017-12-17 ENCOUNTER — Ambulatory Visit: Payer: Medicare Other | Admitting: Physical Therapy

## 2017-12-17 ENCOUNTER — Encounter: Payer: Self-pay | Admitting: Physical Therapy

## 2017-12-17 DIAGNOSIS — M545 Low back pain, unspecified: Secondary | ICD-10-CM

## 2017-12-17 DIAGNOSIS — G8929 Other chronic pain: Secondary | ICD-10-CM

## 2017-12-17 DIAGNOSIS — Z9181 History of falling: Secondary | ICD-10-CM

## 2017-12-17 DIAGNOSIS — R2681 Unsteadiness on feet: Secondary | ICD-10-CM

## 2017-12-17 DIAGNOSIS — M6281 Muscle weakness (generalized): Secondary | ICD-10-CM

## 2017-12-17 NOTE — Therapy (Signed)
[facility] [address], [place], [place], [address] Phone: [phone]   Fax:  [phone]  Physical Therapy Treatment  Patient Details  Name: [name] MRN: [id number] Date of Birth: October 06, 1935 Referring Provider: Jovita Gamma, MD   Encounter Date: 12/17/2017  PT End of Session - 12/17/17 1611    Visit Number  4    Number of Visits  16    Date for PT Re-Evaluation  02/01/18    Authorization Type  UHC Medicare    PT Start Time  1950    PT Stop Time  1612    PT Time Calculation (min)  42 min    Equipment Utilized During Treatment  Back brace    Activity Tolerance  Patient tolerated treatment well    Behavior During Therapy  [facility] for tasks assessed/performed       Past Medical History:  Diagnosis Date  . Anxiety   . Colon polyps   . Complication of anesthesia    takes longer to wake up- admitted overnight after colonoscopy  . Depression   . GERD (gastroesophageal reflux disease)   . Glaucoma   . Headache    migraines  . Hx: UTI (urinary tract infection)   . Hypercholesteremia   . Hypertension   . Lichen simplex chronicus   . Lumbar stenosis with neurogenic claudication   . Macular degeneration   . Osteoarthritis   . Osteopenia    of the hip  . PONV (postoperative nausea and vomiting)   . Stress incontinence, female   . Unsteady gait   . Urinary urgency   . Wears glasses   . Whooping cough    as a baby    Past Surgical History:  Procedure Laterality Date  . ABDOMINAL HYSTERECTOMY     ovaries remain  . ANTERIOR LAT LUMBAR FUSION N/A 10/26/2017   Procedure: LUMBAR THREE- LUMBAR FOUR, LUMBAR FOUR- LUMBAR FIVE ANTEROLATERAL LUMBAR INTERBODY ARTHRODESIS;  Surgeon: Jovita Gamma, MD;  Location: [place];  Service: Neurosurgery;  Laterality: N/A;  LUMBAR 3- LUMBAR 4, LUMBAR 4- LUMBAR 5 ANTEROLATERAL LUMBAR INTERBODY ARTHRODESIS, LUMBAR 3- LUMBAR 4, LUMBAR 4- LUMBAR 5 PERCUTANEOUS PEDICLE SCREW  FIXATION  . APPENDECTOMY    . BLADDER REPAIR     x two  . CHOLECYSTECTOMY    . COLONOSCOPY    . EYE SURGERY  11/14, 12/14   Macular Dengeneration, Glaucoma  . EYE SURGERY  2016   left eye  . LESION REMOVAL Right 06/28/2013   Procedure: EXCISION 3 cm right labial sebaceous cyst;  Surgeon: Lyman Speller, MD;  Location: [facility];  Service: Gynecology;  Laterality: Right;  . LUMBAR LAMINECTOMY/DECOMPRESSION MICRODISCECTOMY Right 03/20/2016   Procedure: Right - Lumbar four-five lumbar laminotomy, foraminotomy, and possible microdiscectomy;  Surgeon: Jovita Gamma, MD;  Location: [place] [facility];  Service: Neurosurgery;  Laterality: Right;  right  . LUMBAR PERCUTANEOUS PEDICLE SCREW 2 LEVEL  10/26/2017   Procedure: LUMBAR THREE- LUMBAR FOUR, LUMBAR FOUR- LUMBAR FIVE PERCUTANEOUS PEDICLE SCREW FIXATION;  Surgeon: Jovita Gamma, MD;  Location: [place];  Service: Neurosurgery;;  . MOHS SURGERY     procedure to remove basal cell  . TONSILLECTOMY AND ADENOIDECTOMY    . URETHRAL SLING  2007  . URETHRAL SLING  1/12   midurethral   . vertebroplasty secondary to traumatic compression fracture      There were no vitals filed for this visit.  Subjective Assessment - 12/17/17 1533    Subjective  doing well, wearing brace today    Patient Stated Goals  improve pain    Currently in Pain?  No/denies                       [facility] Adult PT Treatment/Exercise - 12/17/17 1534      Lumbar Exercises: Aerobic   Nustep  Level 2 x 10 minutes       Lumbar Exercises: Seated   Long Arc Quad on Chair  Both;10 reps;Weights    LAQ on Chair Weights (lbs)  3 5 sec hold    Hip Flexion on Ball  Both;10 reps on mat table; 2#; 5 sec hold    Sit to Stand  5 reps    Other Seated Lumbar Exercises  horizontal abduction with red theraband 2x10, scapular retraction with red theraband 2x10; cues for abdominal bracing      Knee/Hip Exercises: Standing   Heel Raises  Both;10 reps    Hip Abduction   Both;10 reps;Knee straight    Hip Extension  Both;10 reps;Knee straight               PT Short Term Goals - 12/07/17 1545      PT SHORT TERM GOAL #1   Title  verbalize understanding of posture/body mechanics to decrease risk of reinjury    Status  New    Target Date  01/04/18      PT SHORT TERM GOAL #2   Title  improve functional strength by demonstrating ability to stand without UE support at least 3/5 trials    Status  New    Target Date  01/04/18      PT SHORT TERM GOAL #3   Title  improve timed up and go to < 20 sec for improved functional mobility    Status  New    Target Date  01/04/18        PT Long Term Goals - 12/07/17 1544      PT LONG TERM GOAL #1   Title  independent with HEP    Status  New    Target Date  01/18/18      PT LONG TERM GOAL #2   Title  demonstrate improved functional strength by performing 5x STS in < 20 sec withou UE support    Status  New    Target Date  02/01/18      PT LONG TERM GOAL #3   Title  improve timed up and go to < 17 sec with LRAD for improved function    Status  New    Target Date  02/01/18      PT LONG TERM GOAL #4   Title  amb > 250' with LRAD on various indoor/outdoor surfaces modified independently for improved function    Status  New    Target Date  02/01/18      PT LONG TERM GOAL #5   Title  report pain < 4/10 with activity for improved functional mobility    Status  New    Target Date  02/01/18            Plan - 12/17/17 1612    Clinical Impression Statement  Pt tolerated session well today without pain.  Continues to have occasional LOB with SPC but at this time pt mostly able to self correct.  Would benefit from RW but pt stated on eval that MD "told me not to use one."  Will continue to benefit from  PT to maximize function.    Rehab Potential  Good    PT Frequency  2x / week    PT Duration  6 weeks    PT Treatment/Interventions  ADLs/Self Care Home Management;Cryotherapy;Electrical  Stimulation;Moist Heat;Traction;Therapeutic exercise;Therapeutic activities;Functional mobility training;Stair training;Gait training;DME Instruction;Ultrasound;Balance training;Neuromuscular re-education;Patient/family education;Manual techniques;Taping;Dry needling    PT Next Visit Plan  review HEP and add to program; core/hip strengthening maintaining neutral spine, manual/modalities PRN    PT Home Exercise Plan  Access Code: [email or web address]    Consulted and Agree with Plan of Care  Patient       Patient will benefit from skilled therapeutic intervention in order to improve the following deficits and impairments:  Abnormal gait, Decreased balance, Decreased cognition, Decreased knowledge of use of DME, Decreased mobility, Decreased safety awareness, Impaired flexibility, Postural dysfunction, Improper body mechanics, Impaired perceived functional ability, Difficulty walking, Increased muscle spasms, Pain, Increased fascial restricitons, Decreased strength, Decreased endurance  Visit Diagnosis: Chronic right-sided low back pain without sciatica  Muscle weakness (generalized)  Unsteadiness on feet  History of falling     Problem List Patient Active Problem List   Diagnosis Date Noted  . Liver lesion 11/10/2017  . Nausea & vomiting 10/31/2017  . Chronic daily headache 07/08/2017  . Memory changes 03/06/2017  . Vaginal itching 04/04/2016  . Lumbar stenosis with neurogenic claudication 03/20/2016  . Scalp pain 10/22/2015  . Post-traumatic headache 10/11/2015  . Routine health maintenance 03/06/2013  . Macular degeneration of right eye 03/03/2013  . Glaucoma 03/03/2013  . Hyperlipidemia 03/14/2009  . Depression 10/19/2007  . Essential hypertension 10/19/2007  . GASTROESOPHAGEAL REFLUX DISEASE 10/19/2007  . Osteoarthritis 10/19/2007  . STRESS INCONTINENCE 10/19/2007  . Allergic state 10/19/2007      Laureen Abrahams, PT, DPT 12/17/17 4:15 PM      [facility] [address] [place], [place], [address] Phone: [phone]   Fax:  [phone]  Name: [name] MRN: [id number] Date of Birth: 03/11/1936

## 2017-12-21 ENCOUNTER — Ambulatory Visit: Payer: Medicare Other

## 2017-12-21 ENCOUNTER — Other Ambulatory Visit: Payer: Self-pay | Admitting: Family Medicine

## 2017-12-21 DIAGNOSIS — Z9181 History of falling: Secondary | ICD-10-CM

## 2017-12-21 DIAGNOSIS — M6281 Muscle weakness (generalized): Secondary | ICD-10-CM

## 2017-12-21 DIAGNOSIS — M545 Low back pain: Secondary | ICD-10-CM | POA: Diagnosis not present

## 2017-12-21 DIAGNOSIS — R2681 Unsteadiness on feet: Secondary | ICD-10-CM

## 2017-12-21 DIAGNOSIS — G8929 Other chronic pain: Secondary | ICD-10-CM

## 2017-12-21 NOTE — Therapy (Addendum)
[facility] [address], [place], [place], [address] Phone: [phone]   Fax:  [phone]  Physical Therapy Treatment  Patient Details  Name: [name] MRN: [id number] Date of Birth: May 08, 1936 Referring Provider: Jovita Gamma, MD   Encounter Date: 12/21/2017  [name] End of Session - 12/21/17 1532    Visit Number  5    Date for [name] Re-Evaluation  02/01/18    Authorization Type  UHC Medicare    [name] Start Time  7253    [name] Stop Time  1531    [name] Time Calculation (min)  48 min    Activity Tolerance  Patient tolerated treatment well    Behavior During Therapy  [facility] for tasks assessed/performed       Past Medical History:  Diagnosis Date  . Anxiety   . Colon polyps   . Complication of anesthesia    takes longer to wake up- admitted overnight after colonoscopy  . Depression   . GERD (gastroesophageal reflux disease)   . Glaucoma   . Headache    migraines  . Hx: UTI (urinary tract infection)   . Hypercholesteremia   . Hypertension   . Lichen simplex chronicus   . Lumbar stenosis with neurogenic claudication   . Macular degeneration   . Osteoarthritis   . Osteopenia    of the hip  . PONV (postoperative nausea and vomiting)   . Stress incontinence, female   . Unsteady gait   . Urinary urgency   . Wears glasses   . Whooping cough    as a baby    Past Surgical History:  Procedure Laterality Date  . ABDOMINAL HYSTERECTOMY     ovaries remain  . ANTERIOR LAT LUMBAR FUSION N/A 10/26/2017   Procedure: LUMBAR THREE- LUMBAR FOUR, LUMBAR FOUR- LUMBAR FIVE ANTEROLATERAL LUMBAR INTERBODY ARTHRODESIS;  Surgeon: Jovita Gamma, MD;  Location: [place];  Service: Neurosurgery;  Laterality: N/A;  LUMBAR 3- LUMBAR 4, LUMBAR 4- LUMBAR 5 ANTEROLATERAL LUMBAR INTERBODY ARTHRODESIS, LUMBAR 3- LUMBAR 4, LUMBAR 4- LUMBAR 5 PERCUTANEOUS PEDICLE SCREW FIXATION  . APPENDECTOMY    . BLADDER REPAIR     x two  . CHOLECYSTECTOMY     . COLONOSCOPY    . EYE SURGERY  11/14, 12/14   Macular Dengeneration, Glaucoma  . EYE SURGERY  2016   left eye  . LESION REMOVAL Right 06/28/2013   Procedure: EXCISION 3 cm right labial sebaceous cyst;  Surgeon: Lyman Speller, MD;  Location: [facility];  Service: Gynecology;  Laterality: Right;  . LUMBAR LAMINECTOMY/DECOMPRESSION MICRODISCECTOMY Right 03/20/2016   Procedure: Right - Lumbar four-five lumbar laminotomy, foraminotomy, and possible microdiscectomy;  Surgeon: Jovita Gamma, MD;  Location: [place];  Service: Neurosurgery;  Laterality: Right;  right  . LUMBAR PERCUTANEOUS PEDICLE SCREW 2 LEVEL  10/26/2017   Procedure: LUMBAR THREE- LUMBAR FOUR, LUMBAR FOUR- LUMBAR FIVE PERCUTANEOUS PEDICLE SCREW FIXATION;  Surgeon: Jovita Gamma, MD;  Location: [place];  Service: Neurosurgery;;  . MOHS SURGERY     procedure to remove basal cell  . TONSILLECTOMY AND ADENOIDECTOMY    . URETHRAL SLING  2007  . URETHRAL SLING  1/12   midurethral   . vertebroplasty secondary to traumatic compression fracture      There were no vitals filed for this visit.  Subjective Assessment - 12/21/17 1537    Subjective  Doing well.  I'm walking more but I dont feel steady    Currently in Pain?  No/denies         [facility] [name] Assessment - 12/21/17 0001      Standardized Balance Assessment   Standardized Balance Assessment  Timed Up and Go Test      Timed Up and Go Test   Normal TUG (seconds)  25.95 with cane                   [facility] Adult [name] Treatment/Exercise - 12/21/17 0001      Ambulation/Gait   Stairs  Yes    Stairs Assistance  4: Min guard    Stair Management Technique  One rail Right;Step to pattern;With cane      Exercises   Exercises  Knee/Hip      Lumbar Exercises: Aerobic   Nustep  Level 2 x 10 minutes       Lumbar Exercises: Standing   Heel Raises  20 reps      Lumbar Exercises: Seated   Long Arc Quad on Chair  Both;10 reps;Weights    LAQ on Chair Weights  (lbs)  3 5 sec hold    Sit to Stand  15 reps 3x5 without hands    Other Seated Lumbar Exercises  horizontal abduction with red theraband 2x10, scapular retraction with red theraband 2x10; cues for abdominal bracing      Knee/Hip Exercises: Standing   Hip Abduction  Both;Knee straight;20 reps    Hip Extension  Both;Knee straight;20 reps               [name] Short Term Goals - 12/21/17 1456      [name] SHORT TERM GOAL #1   Title  verbalize understanding of posture/body mechanics to decrease risk of reinjury    Status  Achieved      [name] SHORT TERM GOAL #2   Title  improve functional strength by demonstrating ability to stand without UE support at least 3/5 trials    Status  Achieved      [name] SHORT TERM GOAL #3   Title  improve timed up and go to < 20 sec for improved functional mobility    Baseline  25.95 seconds    Period  Weeks    Status  On-going        [name] Long Term Goals - 12/07/17 1544      [name] LONG TERM GOAL #1   Title  independent with HEP    Status  New    Target Date  01/18/18      [name] LONG TERM GOAL #2   Title  demonstrate improved functional strength by performing 5x STS in < 20 sec withou UE support    Status  New    Target Date  02/01/18      [name] LONG TERM GOAL #3   Title  improve timed up and go to < 17 sec with LRAD for improved function    Status  New    Target Date  02/01/18      [name] LONG TERM GOAL #4   Title  amb > 250' with LRAD on various indoor/outdoor surfaces modified independently for improved function    Status  New    Target Date  02/01/18      [name] LONG TERM GOAL #5   Title  report pain < 4/10 with activity for improved functional mobility    Status  New    Target Date  02/01/18            Plan - 12/21/17 1507  Clinical Impression Statement  [name] continues to work on LE strength, stability and endurance.  Verbal cues are required for abdominal bracing and posture during standing exercise.  [name] has received body mechanics review today and  reports compliance with this at home.  [name] with instability with change of direction with TUG today.  [name] will continue to benefit from [name] for LE strength, function and endurance to return to prior level of function.         Patient will benefit from skilled therapeutic intervention in order to improve the following deficits and impairments:     Visit Diagnosis: Chronic right-sided low back pain without sciatica  Muscle weakness (generalized)  History of falling  Unsteadiness on feet     Problem List Patient Active Problem List   Diagnosis Date Noted  . Liver lesion 11/10/2017  . Nausea & vomiting 10/31/2017  . Chronic daily headache 07/08/2017  . Memory changes 03/06/2017  . Vaginal itching 04/04/2016  . Lumbar stenosis with neurogenic claudication 03/20/2016  . Scalp pain 10/22/2015  . Post-traumatic headache 10/11/2015  . Routine health maintenance 03/06/2013  . Macular degeneration of right eye 03/03/2013  . Glaucoma 03/03/2013  . Hyperlipidemia 03/14/2009  . Depression 10/19/2007  . Essential hypertension 10/19/2007  . GASTROESOPHAGEAL REFLUX DISEASE 10/19/2007  . Osteoarthritis 10/19/2007  . STRESS INCONTINENCE 10/19/2007  . Allergic state 10/19/2007    [name], [name] 12/21/17 3:40 PM  [facility] [address], [place], [place], [address] Phone: [phone]   Fax:  [phone]  Name: [name] MRN: [id number] Date of Birth: Dec 11, 1935

## 2017-12-22 LAB — HM MAMMOGRAPHY

## 2017-12-24 ENCOUNTER — Ambulatory Visit: Payer: Medicare Other

## 2017-12-24 ENCOUNTER — Encounter

## 2017-12-25 ENCOUNTER — Encounter: Payer: Self-pay | Admitting: Family Medicine

## 2018-01-05 ENCOUNTER — Ambulatory Visit: Payer: Medicare Other

## 2018-01-05 DIAGNOSIS — G8929 Other chronic pain: Secondary | ICD-10-CM

## 2018-01-05 DIAGNOSIS — R2681 Unsteadiness on feet: Secondary | ICD-10-CM

## 2018-01-05 DIAGNOSIS — M6281 Muscle weakness (generalized): Secondary | ICD-10-CM

## 2018-01-05 DIAGNOSIS — M545 Low back pain: Secondary | ICD-10-CM | POA: Diagnosis not present

## 2018-01-05 DIAGNOSIS — Z9181 History of falling: Secondary | ICD-10-CM

## 2018-01-05 NOTE — Patient Instructions (Signed)
Access Code: [email or web address]  URL: [email or web address]  Date: 01/05/2018  Prepared by: Sigurd Sos     Standing Hip Abduction - 10 reps - 2 sets - 2x daily - 7x weekly  Standing Hip Extension - 10 reps - 2 sets - 2x daily - 7x weekly   Standing Heel Raise with Support - 10 reps - 2 sets - 2x daily - 7x weekly

## 2018-01-05 NOTE — Therapy (Signed)
[facility] [address], [place], [place], [address] Phone: [phone]   Fax:  [phone]  Physical Therapy Treatment  Patient Details  Name: [name] MRN: [id number] Date of Birth: 1936/04/03 Referring Provider: Jovita Gamma, MD   Encounter Date: 01/05/2018  PT End of Session - 01/05/18 1054    Visit Number  6    Date for PT Re-Evaluation  02/01/18    Authorization Type  [employer] Medicare    PT Start Time  1029 late    PT Stop Time  1059    PT Time Calculation (min)  30 min    Equipment Utilized During Treatment  Back brace    Activity Tolerance  Patient tolerated treatment well    Behavior During Therapy  [facility] for tasks assessed/performed       Past Medical History:  Diagnosis Date  . Anxiety   . Colon polyps   . Complication of anesthesia    takes longer to wake up- admitted overnight after colonoscopy  . Depression   . GERD (gastroesophageal reflux disease)   . Glaucoma   . Headache    migraines  . Hx: UTI (urinary tract infection)   . Hypercholesteremia   . Hypertension   . Lichen simplex chronicus   . Lumbar stenosis with neurogenic claudication   . Macular degeneration   . Osteoarthritis   . Osteopenia    of the hip  . PONV (postoperative nausea and vomiting)   . Stress incontinence, female   . Unsteady gait   . Urinary urgency   . Wears glasses   . Whooping cough    as a baby    Past Surgical History:  Procedure Laterality Date  . ABDOMINAL HYSTERECTOMY     ovaries remain  . ANTERIOR LAT LUMBAR FUSION N/A 10/26/2017   Procedure: LUMBAR THREE- LUMBAR FOUR, LUMBAR FOUR- LUMBAR FIVE ANTEROLATERAL LUMBAR INTERBODY ARTHRODESIS;  Surgeon: Jovita Gamma, MD;  Location: [place];  Service: Neurosurgery;  Laterality: N/A;  LUMBAR 3- LUMBAR 4, LUMBAR 4- LUMBAR 5 ANTEROLATERAL LUMBAR INTERBODY ARTHRODESIS, LUMBAR 3- LUMBAR 4, LUMBAR 4- LUMBAR 5 PERCUTANEOUS PEDICLE SCREW FIXATION  .  APPENDECTOMY    . BLADDER REPAIR     x two  . CHOLECYSTECTOMY    . COLONOSCOPY    . EYE SURGERY  11/14, 12/14   Macular Dengeneration, Glaucoma  . EYE SURGERY  2016   left eye  . LESION REMOVAL Right 06/28/2013   Procedure: EXCISION 3 cm right labial sebaceous cyst;  Surgeon: Lyman Speller, MD;  Location: [facility];  Service: Gynecology;  Laterality: Right;  . LUMBAR LAMINECTOMY/DECOMPRESSION MICRODISCECTOMY Right 03/20/2016   Procedure: Right - Lumbar four-five lumbar laminotomy, foraminotomy, and possible microdiscectomy;  Surgeon: Jovita Gamma, MD;  Location: [place];  Service: Neurosurgery;  Laterality: Right;  right  . LUMBAR PERCUTANEOUS PEDICLE SCREW 2 LEVEL  10/26/2017   Procedure: LUMBAR THREE- LUMBAR FOUR, LUMBAR FOUR- LUMBAR FIVE PERCUTANEOUS PEDICLE SCREW FIXATION;  Surgeon: Jovita Gamma, MD;  Location: [place];  Service: Neurosurgery;;  . MOHS SURGERY     procedure to remove basal cell  . TONSILLECTOMY AND ADENOIDECTOMY    . URETHRAL SLING  2007  . URETHRAL SLING  1/12   midurethral   . vertebroplasty secondary to traumatic compression fracture      There were no vitals filed for this visit.  Subjective Assessment - 01/05/18 1033    Subjective  I felt a pop in my back  when i sat down in church.  It hurt but it is getting better.      Currently in Pain?  Yes    Pain Score  1     Pain Location  Back    Pain Orientation  Right    Pain Descriptors / Indicators  Sore    Pain Type  Surgical pain;Chronic pain    Pain Onset  More than a month ago    Pain Frequency  Constant    Aggravating Factors   not sure    Pain Relieving Factors  rest                       [facility] Adult PT Treatment/Exercise - 01/05/18 0001      Lumbar Exercises: Aerobic   Nustep  Level 2 x 6 minutes       Lumbar Exercises: Standing   Heel Raises  20 reps      Lumbar Exercises: Seated   Long Arc Quad on Chair  Both;10 reps;Weights    LAQ on Chair Weights (lbs)  3 5  sec hold    Sit to Stand  15 reps 3x5 without hands    Other Seated Lumbar Exercises  horizontal abduction with red theraband 2x10, scapular retraction with red theraband 2x10; cues for abdominal bracing      Knee/Hip Exercises: Standing   Hip Abduction  Both;Knee straight;20 reps    Hip Extension  Both;Knee straight;20 reps             PT Education - 01/05/18 1047    Education provided  Yes    Education Details  Access Code: [email or web address]     Person(s) Educated  Patient    Methods  Explanation;Demonstration;Handout    Comprehension  Verbalized understanding;Returned demonstration       PT Short Term Goals - 12/21/17 1456      PT SHORT TERM GOAL #1   Title  verbalize understanding of posture/body mechanics to decrease risk of reinjury    Status  Achieved      PT SHORT TERM GOAL #2   Title  improve functional strength by demonstrating ability to stand without UE support at least 3/5 trials    Status  Achieved      PT SHORT TERM GOAL #3   Title  improve timed up and go to < 20 sec for improved functional mobility    Baseline  25.95 seconds    Period  Weeks    Status  On-going        PT Long Term Goals - 12/07/17 1544      PT LONG TERM GOAL #1   Title  independent with HEP    Status  New    Target Date  01/18/18      PT LONG TERM GOAL #2   Title  demonstrate improved functional strength by performing 5x STS in < 20 sec withou UE support    Status  New    Target Date  02/01/18      PT LONG TERM GOAL #3   Title  improve timed up and go to < 17 sec with LRAD for improved function    Status  New    Target Date  02/01/18      PT LONG TERM GOAL #4   Title  amb > 250' with LRAD on various indoor/outdoor surfaces modified independently for improved function    Status  New    Target Date  02/01/18      PT LONG TERM GOAL #5   Title  report pain < 4/10 with activity for improved functional mobility    Status  New    Target Date  02/01/18            Plan -  01/05/18 1039    Clinical Impression Statement  Pt was 15 minutes late for appointment.  Pt was on vacation so lapse in treatment since 12/21/17.  Pt required verbal cues for speed of exercise and for abdominal bracing with exercise.  Pt demonstrates LE weakness and hip instability with standing exercise today.  Pt will continue to benefit from skilled PT to improve core strength as she weans from the brace and to improve functional strength and endurance.      Rehab Potential  Good    PT Frequency  2x / week    PT Duration  6 weeks    PT Treatment/Interventions  ADLs/Self Care Home Management;Cryotherapy;Electrical Stimulation;Moist Heat;Traction;Therapeutic exercise;Therapeutic activities;Functional mobility training;Stair training;Gait training;DME Instruction;Ultrasound;Balance training;Neuromuscular re-education;Patient/family education;Manual techniques;Taping;Dry needling    PT Next Visit Plan  core/hip strengthening maintaining neutral spine, test TUG    PT Home Exercise Plan  Access Code: [email or web address]    Consulted and Agree with Plan of Care  Patient       Patient will benefit from skilled therapeutic intervention in order to improve the following deficits and impairments:  Abnormal gait, Decreased balance, Decreased cognition, Decreased knowledge of use of DME, Decreased mobility, Decreased safety awareness, Impaired flexibility, Postural dysfunction, Improper body mechanics, Impaired perceived functional ability, Difficulty walking, Increased muscle spasms, Pain, Increased fascial restricitons, Decreased strength, Decreased endurance  Visit Diagnosis: Chronic right-sided low back pain without sciatica  Muscle weakness (generalized)  History of falling  Unsteadiness on feet     Problem List Patient Active Problem List   Diagnosis Date Noted  . Liver lesion 11/10/2017  . Nausea & vomiting 10/31/2017  . Chronic daily headache 07/08/2017  . Memory changes 03/06/2017  . Vaginal  itching 04/04/2016  . Lumbar stenosis with neurogenic claudication 03/20/2016  . Scalp pain 10/22/2015  . Post-traumatic headache 10/11/2015  . Routine health maintenance 03/06/2013  . Macular degeneration of right eye 03/03/2013  . Glaucoma 03/03/2013  . Hyperlipidemia 03/14/2009  . Depression 10/19/2007  . Essential hypertension 10/19/2007  . GASTROESOPHAGEAL REFLUX DISEASE 10/19/2007  . Osteoarthritis 10/19/2007  . STRESS INCONTINENCE 10/19/2007  . Allergic state 10/19/2007     Sigurd Sos, PT 01/05/18 11:03 AM  [facility] [address], [place], [place], [address] Phone: [phone]   Fax:  [phone]  Name: [name] MRN: [id number] Date of Birth: 09/19/1935

## 2018-01-07 ENCOUNTER — Ambulatory Visit: Payer: Medicare Other

## 2018-01-07 DIAGNOSIS — Z9181 History of falling: Secondary | ICD-10-CM

## 2018-01-07 DIAGNOSIS — M6281 Muscle weakness (generalized): Secondary | ICD-10-CM

## 2018-01-07 DIAGNOSIS — M545 Low back pain, unspecified: Secondary | ICD-10-CM

## 2018-01-07 DIAGNOSIS — G8929 Other chronic pain: Secondary | ICD-10-CM

## 2018-01-07 DIAGNOSIS — R2681 Unsteadiness on feet: Secondary | ICD-10-CM

## 2018-01-07 NOTE — Therapy (Signed)
[facility] [address] [place], [place], [address] Phone: [phone]   Fax:  [phone]  Physical Therapy Treatment  Patient Details  Name: [name] MRN: [id number] Date of Birth: [birth year] Referring Provider: Jovita Gamma, MD   Encounter Date: 01/07/2018  PT End of Session - 01/07/18 1059    Visit Number  7    Date for PT Re-Evaluation  02/01/18    Authorization Type  [employer] Medicare    PT Start Time  1010    PT Stop Time  1057    PT Time Calculation (min)  47 min    Equipment Utilized During Treatment  Back brace    Activity Tolerance  Patient tolerated treatment well    Behavior During Therapy  [facility] for tasks assessed/performed       Past Medical History:  Diagnosis Date  . Anxiety   . Colon polyps   . Complication of anesthesia    takes longer to wake up- admitted overnight after colonoscopy  . Depression   . GERD (gastroesophageal reflux disease)   . Glaucoma   . Headache    migraines  . Hx: UTI (urinary tract infection)   . Hypercholesteremia   . Hypertension   . Lichen simplex chronicus   . Lumbar stenosis with neurogenic claudication   . Macular degeneration   . Osteoarthritis   . Osteopenia    of the hip  . PONV (postoperative nausea and vomiting)   . Stress incontinence, female   . Unsteady gait   . Urinary urgency   . Wears glasses   . Whooping cough    as a baby    Past Surgical History:  Procedure Laterality Date  . ABDOMINAL HYSTERECTOMY     ovaries remain  . ANTERIOR LAT LUMBAR FUSION N/A 10/26/2017   Procedure: LUMBAR THREE- LUMBAR FOUR, LUMBAR FOUR- LUMBAR FIVE ANTEROLATERAL LUMBAR INTERBODY ARTHRODESIS;  Surgeon: Jovita Gamma, MD;  Location: [place];  Service: Neurosurgery;  Laterality: N/A;  LUMBAR 3- LUMBAR 4, LUMBAR 4- LUMBAR 5 ANTEROLATERAL LUMBAR INTERBODY ARTHRODESIS, LUMBAR 3- LUMBAR 4, LUMBAR 4- LUMBAR 5 PERCUTANEOUS PEDICLE SCREW FIXATION  . APPENDECTOMY     . BLADDER REPAIR     x two  . CHOLECYSTECTOMY    . COLONOSCOPY    . EYE SURGERY  11/14, 12/14   Macular Dengeneration, Glaucoma  . EYE SURGERY  2016   left eye  . LESION REMOVAL Right 06/28/2013   Procedure: EXCISION 3 cm right labial sebaceous cyst;  Surgeon: Lyman Speller, MD;  Location: [facility];  Service: Gynecology;  Laterality: Right;  . LUMBAR LAMINECTOMY/DECOMPRESSION MICRODISCECTOMY Right 03/20/2016   Procedure: Right - Lumbar four-five lumbar laminotomy, foraminotomy, and possible microdiscectomy;  Surgeon: Jovita Gamma, MD;  Location: [place];  Service: Neurosurgery;  Laterality: Right;  right  . LUMBAR PERCUTANEOUS PEDICLE SCREW 2 LEVEL  10/26/2017   Procedure: LUMBAR THREE- LUMBAR FOUR, LUMBAR FOUR- LUMBAR FIVE PERCUTANEOUS PEDICLE SCREW FIXATION;  Surgeon: Jovita Gamma, MD;  Location: [place];  Service: Neurosurgery;;  . MOHS SURGERY     procedure to remove basal cell  . TONSILLECTOMY AND ADENOIDECTOMY    . URETHRAL SLING  2007  . URETHRAL SLING  1/12   midurethral   . vertebroplasty secondary to traumatic compression fracture      There were no vitals filed for this visit.  Subjective Assessment - 01/07/18 1022    Subjective  I fell the other night and landed on  my butt.  No back pain.      Currently in Pain?  No/denies                       [facility] Adult PT Treatment/Exercise - 01/07/18 0001      Lumbar Exercises: Aerobic   Nustep  Level 2 x 10 minutes       Lumbar Exercises: Standing   Heel Raises  20 reps      Lumbar Exercises: Seated   Long Arc Quad on Chair  Both;10 reps;Weights    LAQ on Chair Weights (lbs)  3 5 sec hold    Sit to Stand  15 reps 3x5 without hands      Knee/Hip Exercises: Standing   Hip Abduction  Both;Knee straight;20 reps    Hip Extension  Both;Knee straight;20 reps    Rocker Board  3 minutes    Rebounder  weight shifting 3 ways x 1 minutes     Other Standing Knee Exercises  standing on black balance  pad with alternating arm flexion x 10 each               PT Short Term Goals - 12/21/17 1456      PT SHORT TERM GOAL #1   Title  verbalize understanding of posture/body mechanics to decrease risk of reinjury    Status  Achieved      PT SHORT TERM GOAL #2   Title  improve functional strength by demonstrating ability to stand without UE support at least 3/5 trials    Status  Achieved      PT SHORT TERM GOAL #3   Title  improve timed up and go to < 20 sec for improved functional mobility    Baseline  25.95 seconds    Period  Weeks    Status  On-going        PT Long Term Goals - 12/07/17 1544      PT LONG TERM GOAL #1   Title  independent with HEP    Status  New    Target Date  01/18/18      PT LONG TERM GOAL #2   Title  demonstrate improved functional strength by performing 5x STS in < 20 sec withou UE support    Status  New    Target Date  02/01/18      PT LONG TERM GOAL #3   Title  improve timed up and go to < 17 sec with LRAD for improved function    Status  New    Target Date  02/01/18      PT LONG TERM GOAL #4   Title  amb > 250' with LRAD on various indoor/outdoor surfaces modified independently for improved function    Status  New    Target Date  02/01/18      PT LONG TERM GOAL #5   Title  report pain < 4/10 with activity for improved functional mobility    Status  New    Target Date  02/01/18            Plan - 01/07/18 1042    Clinical Impression Statement  Pt had a fall 2 days ago due to continued balance deficits.  PT focused on balance exercises that incorporate core stabilization today.  Pt required stand by assistance and verbal cues for technique and safety.  Pt with LE weakness, core weakness and hip instability and will continue to benefit from skilled  PT for LE strength, core strength and balance/endurance.      Rehab Potential  Good    PT Frequency  2x / week    PT Duration  6 weeks    PT Treatment/Interventions  ADLs/Self Care Home  Management;Cryotherapy;Electrical Stimulation;Moist Heat;Traction;Therapeutic exercise;Therapeutic activities;Functional mobility training;Stair training;Gait training;DME Instruction;Ultrasound;Balance training;Neuromuscular re-education;Patient/family education;Manual techniques;Taping;Dry needling    PT Next Visit Plan  core/hip strengthening maintaining neutral spine    PT Home Exercise Plan  Access Code: [email or web address]    Recommended Other Services  initial certification is signed    Consulted and Agree with Plan of Care  Patient       Patient will benefit from skilled therapeutic intervention in order to improve the following deficits and impairments:  Abnormal gait, Decreased balance, Decreased cognition, Decreased knowledge of use of DME, Decreased mobility, Decreased safety awareness, Impaired flexibility, Postural dysfunction, Improper body mechanics, Impaired perceived functional ability, Difficulty walking, Increased muscle spasms, Pain, Increased fascial restricitons, Decreased strength, Decreased endurance  Visit Diagnosis: Chronic right-sided low back pain without sciatica  Muscle weakness (generalized)  History of falling  Unsteadiness on feet     Problem List Patient Active Problem List   Diagnosis Date Noted  . Liver lesion 11/10/2017  . Nausea & vomiting 10/31/2017  . Chronic daily headache 07/08/2017  . Memory changes 03/06/2017  . Vaginal itching 04/04/2016  . Lumbar stenosis with neurogenic claudication 03/20/2016  . Scalp pain 10/22/2015  . Post-traumatic headache 10/11/2015  . Routine health maintenance 03/06/2013  . Macular degeneration of right eye 03/03/2013  . Glaucoma 03/03/2013  . Hyperlipidemia 03/14/2009  . Depression 10/19/2007  . Essential hypertension 10/19/2007  . GASTROESOPHAGEAL REFLUX DISEASE 10/19/2007  . Osteoarthritis 10/19/2007  . STRESS INCONTINENCE 10/19/2007  . Allergic state 10/19/2007     Sigurd Sos, PT 01/07/18 11:00  AM  [facility] [address], [place], [place], [address] Phone: [phone]   Fax:  [phone]  Name: [name] MRN: [id number] Date of Birth: 1936/04/23

## 2018-01-09 ENCOUNTER — Other Ambulatory Visit: Payer: Self-pay | Admitting: Family Medicine

## 2018-01-11 ENCOUNTER — Telehealth: Payer: Self-pay | Admitting: Obstetrics & Gynecology

## 2018-01-11 NOTE — Telephone Encounter (Signed)
Spoke with patient. Patient states that she started having light bright red bleeding 5 days ago. States bleeding is now a light pink spotting. Denies any pain or heavy bleeding. Advised will need to be seen for further evaluation. Declines appointment for today. Appointment scheduled for tomorrow 01/12/2018 at 11 am with Dr.Miller. Patient is agreeable to date and time.  Routing to provider for final review. Patient agreeable to disposition. Will close encounter.

## 2018-01-11 NOTE — Telephone Encounter (Signed)
Patient stated that she is having vaginal bleeding.

## 2018-01-12 ENCOUNTER — Encounter: Payer: Self-pay | Admitting: Obstetrics & Gynecology

## 2018-01-12 ENCOUNTER — Ambulatory Visit: Payer: Medicare Other | Admitting: Obstetrics & Gynecology

## 2018-01-12 VITALS — BP 134/70 | HR 68 | Resp 16 | Ht 60.0 in | Wt 160.0 lb

## 2018-01-12 DIAGNOSIS — N898 Other specified noninflammatory disorders of vagina: Secondary | ICD-10-CM

## 2018-01-12 DIAGNOSIS — R3915 Urgency of urination: Secondary | ICD-10-CM

## 2018-01-12 LAB — POCT URINALYSIS DIPSTICK
Bilirubin, UA: NEGATIVE
Glucose, UA: NEGATIVE
KETONES UA: NEGATIVE
Leukocytes, UA: NEGATIVE
Nitrite, UA: NEGATIVE
PROTEIN UA: NEGATIVE
Urobilinogen, UA: 0.2 E.U./dL
pH, UA: 5 (ref 5.0–8.0)

## 2018-01-12 MED ORDER — TRIAMCINOLONE ACETONIDE 0.5 % EX OINT: 1.0000 "application " | TOPICAL_OINTMENT | Freq: Two times a day (BID) | CUTANEOUS | 0 refills | Status: DC

## 2018-01-12 MED ORDER — SULFAMETHOXAZOLE-TRIMETHOPRIM 800-160 MG PO TABS: 1.0000 | ORAL_TABLET | Freq: Two times a day (BID) | ORAL | 0 refills | Status: DC

## 2018-01-12 NOTE — Progress Notes (Signed)
GYNECOLOGY  VISIT  CC:   Vaginal bleeding?  HPI: 82 y.o. G4P2 Married Caucasian female here for possible vaginal bleeding that she's noted the past two or three days.  Has noted a little blood when she wipes.  This is accompanied by a little urinary urgency.  Denies urinary dysuria.  Denies back pain or pelvic pain.  Denies fever.  She is not sure if her bleeding is vaginal.  Denies vaginal discharge.    Had UTI in mid March.  Doing well from back surgery standpoint.  Still working on strength in right foot/leg.  Denies any changes in bowel function.  GYNECOLOGIC HISTORY: Patient's last menstrual period was 08/11/1974. Contraception: post menopausal  Menopausal hormone therapy: none  Patient Active Problem List   Diagnosis Date Noted  . Liver lesion 11/10/2017  . Nausea & vomiting 10/31/2017  . Chronic daily headache 07/08/2017  . Memory changes 03/06/2017  . Vaginal itching 04/04/2016  . Lumbar stenosis with neurogenic claudication 03/20/2016  . Scalp pain 10/22/2015  . Post-traumatic headache 10/11/2015  . Routine health maintenance 03/06/2013  . Macular degeneration of right eye 03/03/2013  . Glaucoma 03/03/2013  . Hyperlipidemia 03/14/2009  . Depression 10/19/2007  . Essential hypertension 10/19/2007  . GASTROESOPHAGEAL REFLUX DISEASE 10/19/2007  . Osteoarthritis 10/19/2007  . STRESS INCONTINENCE 10/19/2007  . Allergic state 10/19/2007    Past Medical History:  Diagnosis Date  . Anxiety   . Colon polyps   . Complication of anesthesia    takes longer to wake up- admitted overnight after colonoscopy  . Depression   . GERD (gastroesophageal reflux disease)   . Glaucoma   . Headache    migraines  . Hx: UTI (urinary tract infection)   . Hypercholesteremia   . Hypertension   . Lichen simplex chronicus   . Lumbar stenosis with neurogenic claudication   . Macular degeneration   . Osteoarthritis   . Osteopenia    of the hip  . PONV (postoperative nausea and  vomiting)   . Stress incontinence, female   . Unsteady gait   . Urinary urgency   . Wears glasses   . Whooping cough    as a baby    Past Surgical History:  Procedure Laterality Date  . ABDOMINAL HYSTERECTOMY     ovaries remain  . ANTERIOR LAT LUMBAR FUSION N/A 10/26/2017   Procedure: LUMBAR THREE- LUMBAR FOUR, LUMBAR FOUR- LUMBAR FIVE ANTEROLATERAL LUMBAR INTERBODY ARTHRODESIS;  Surgeon: Jovita Gamma, MD;  Location: [facility];  Service: Neurosurgery;  Laterality: N/A;  LUMBAR 3- LUMBAR 4, LUMBAR 4- LUMBAR 5 ANTEROLATERAL LUMBAR INTERBODY ARTHRODESIS, LUMBAR 3- LUMBAR 4, LUMBAR 4- LUMBAR 5 PERCUTANEOUS PEDICLE SCREW FIXATION  . APPENDECTOMY    . BLADDER REPAIR     x two  . CHOLECYSTECTOMY    . COLONOSCOPY    . EYE SURGERY  11/14, 12/14   Macular Dengeneration, Glaucoma  . EYE SURGERY  2016   left eye  . LESION REMOVAL Right 06/28/2013   Procedure: EXCISION 3 cm right labial sebaceous cyst;  Surgeon: Lyman Speller, MD;  Location: [facility];  Service: Gynecology;  Laterality: Right;  . LUMBAR LAMINECTOMY/DECOMPRESSION MICRODISCECTOMY Right 03/20/2016   Procedure: Right - Lumbar four-five lumbar laminotomy, foraminotomy, and possible microdiscectomy;  Surgeon: Jovita Gamma, MD;  Location: [facility];  Service: Neurosurgery;  Laterality: Right;  right  . LUMBAR PERCUTANEOUS PEDICLE SCREW 2 LEVEL  10/26/2017   Procedure: LUMBAR THREE- LUMBAR FOUR, LUMBAR FOUR- LUMBAR FIVE PERCUTANEOUS PEDICLE SCREW FIXATION;  Surgeon: Jovita Gamma, MD;  Location: [facility];  Service: Neurosurgery;;  . MOHS SURGERY     procedure to remove basal cell  . TONSILLECTOMY AND ADENOIDECTOMY    . URETHRAL SLING  2007  . URETHRAL SLING  1/12   midurethral   . vertebroplasty secondary to traumatic compression fracture      MEDS:   Current Outpatient Medications on File Prior to Visit  Medication Sig Dispense Refill  . acetaminophen (TYLENOL) 500 MG tablet Take 500 mg by mouth daily as needed for mild  pain or headache.     . ALPRAZolam (XANAX) 0.5 MG tablet Take 0.5-1 tablets (0.25-0.5 mg total) by mouth 2 (two) times daily as needed. for anxiety 20 tablet 0  . aspirin EC 81 MG tablet Take 81 mg by mouth daily.    . brimonidine-timolol (COMBIGAN) 0.2-0.5 % ophthalmic solution Place 1 drop into both eyes every 12 (twelve) hours.    . clobetasol ointment (TEMOVATE) 0.05 % Apply topically 2 (two) times daily. Do not use for more than 7 days in a row. 30 g 1  . ezetimibe-simvastatin (VYTORIN) 10-20 MG tablet Take 1 tablet by mouth daily. 90 tablet 3  . ibuprofen (ADVIL,MOTRIN) 200 MG tablet Take 200 mg by mouth every 8 (eight) hours as needed.    . nadolol (CORGARD) 40 MG tablet TAKE 1 TABLET BY MOUTH ONCE DAILY AT BEDTIME 90 tablet 1  . omeprazole (PRILOSEC) 20 MG capsule Take 20 mg by mouth daily before breakfast.    . sodium chloride (OCEAN) 0.65 % SOLN nasal spray Place 1 spray into both nostrils 2 (two) times daily as needed for congestion.    . traZODone (DESYREL) 100 MG tablet TAKE 1/4 TO 1/2 TABLET BY MOUTH AT BEDTIME IF NEEDED FOR SLEEP 15 tablet 1  . venlafaxine XR (EFFEXOR-XR) 150 MG 24 hr capsule TAKE 1 CAPSULE BY MOUTH TWICE A DAY 60 capsule 5   No current facility-administered medications on file prior to visit.     ALLERGIES: Codeine and Shingrix [zoster vac recomb adjuvanted]  Family History  Problem Relation Age of Onset  . Congestive Heart Failure Mother   . Thyroid disease Mother   . Osteoporosis Mother   . Alcoholism Mother   . Prostate cancer Father   . Breast cancer Maternal Grandmother   . Hypertension Sister   . Thyroid disease Sister   . Cancer Sister        brain ?  Marland Kitchen Rheum arthritis Daughter     SH:  Married, non smoker  Review of Systems  Gastrointestinal: Negative for abdominal pain, constipation and diarrhea.  Genitourinary: Positive for dysuria, frequency and urgency. Negative for flank pain and hematuria.    PHYSICAL EXAMINATION:    BP 134/70  (BP Location: Right Arm, Patient Position: Sitting, Cuff Size: Normal)   Pulse 68   Resp 16   Ht 5' (1.524 m)   Wt 160 lb (72.6 kg)   LMP 08/11/1974   BMI 31.25 kg/m     General appearance: alert, cooperative and appears stated age CV:  Regular rate and rhythm Lungs:  clear to auscultation, no wheezes, rales or rhonchi, symmetric air entry Abdomen: soft, non-tender; bowel sounds normal; no masses,  no organomegaly Flank:  No CVA tenderness  Pelvic: External genitalia:  Whitish, thickened tissue that is peri-clitoral, small skin fissures on either side of this skin change              Urethra:  normal appearing urethra  with no masses, tenderness or lesions              Bartholins and Skenes: normal                 Vagina: normal appearing vagina with normal color and discharge, no lesions              Cervix: surgically absent              Bimanual Exam:  Uterus: absentt              Adnexa: no mass, fullness, tenderness              Anus:  no lesions  Pt could not give urine sample.  Due to symptoms and prior/recent UTI, a cath u/a performed under sterile conditions.  Pt tolerated procedure well.  Chaperone was present for exam.  Assessment: Peri-clitoral skin changes c/w lichen sclerosus/planus or simplex chronicus Possible cystits No evidence on physical exam of vaginal bleeding  Plan: Bactrim DS BID x 3 days.  Rx to pharmacy. Urine culture and micro pending.  Results will be called to pt. Triamcinolone ointment 0.5% bid.  Recheck ~3 weeks.

## 2018-01-13 ENCOUNTER — Ambulatory Visit: Payer: Medicare Other

## 2018-01-13 ENCOUNTER — Encounter: Payer: Self-pay | Admitting: Family Medicine

## 2018-01-13 LAB — URINALYSIS, MICROSCOPIC ONLY
Bacteria, UA: NONE SEEN
Casts: NONE SEEN /lpf
EPITHELIAL CELLS (NON RENAL): NONE SEEN /HPF (ref 0–10)

## 2018-01-13 NOTE — Telephone Encounter (Signed)
See note

## 2018-01-13 NOTE — Telephone Encounter (Signed)
[facility] at [facility] they never received the medication refill for nadolol 40mg . Please re-send. Thanks

## 2018-01-14 LAB — URINE CULTURE

## 2018-01-14 MED ORDER — NADOLOL 40 MG PO TABS: 40.0000 mg | ORAL_TABLET | Freq: Every day | ORAL | 1 refills | Status: DC

## 2018-01-14 NOTE — Addendum Note (Signed)
Addended by: Marian Sorrow on: 01/14/2018 08:38 AM   Modules accepted: Orders

## 2018-01-14 NOTE — Telephone Encounter (Signed)
Rx resent to [facility] per pharmacy request.

## 2018-01-15 ENCOUNTER — Encounter: Payer: Self-pay | Admitting: Family Medicine

## 2018-01-15 ENCOUNTER — Ambulatory Visit: Payer: Medicare Other | Admitting: Family Medicine

## 2018-01-15 ENCOUNTER — Other Ambulatory Visit: Payer: Self-pay | Admitting: Obstetrics & Gynecology

## 2018-01-15 VITALS — BP 102/68 | HR 68 | Temp 97.6°F | Ht 60.0 in | Wt 162.0 lb

## 2018-01-15 DIAGNOSIS — L989 Disorder of the skin and subcutaneous tissue, unspecified: Secondary | ICD-10-CM | POA: Diagnosis not present

## 2018-01-15 DIAGNOSIS — K769 Liver disease, unspecified: Secondary | ICD-10-CM | POA: Diagnosis not present

## 2018-01-15 DIAGNOSIS — E785 Hyperlipidemia, unspecified: Secondary | ICD-10-CM

## 2018-01-15 MED ORDER — NITROFURANTOIN MONOHYD MACRO 100 MG PO CAPS: 100.0000 mg | ORAL_CAPSULE | Freq: Two times a day (BID) | ORAL | 0 refills | Status: DC

## 2018-01-15 MED ORDER — KETOCONAZOLE 2 % EX CREA: 1.0000 "application " | TOPICAL_CREAM | Freq: Every day | CUTANEOUS | 0 refills | Status: DC

## 2018-01-15 NOTE — Progress Notes (Signed)
Subjective:  [name] is a 82 y.o. year old very pleasant female patient who presents for/with See problem oriented charting ROS- No chest pain or shortness of breath. No headache or blurry vision.    Past Medical History-  Patient Active Problem List   Diagnosis Date Noted  . Liver lesion 11/10/2017    Priority: High  . Memory changes 03/06/2017    Priority: Medium  . Lumbar stenosis with neurogenic claudication 03/20/2016    Priority: Medium  . Scalp pain 10/22/2015    Priority: Medium  . Macular degeneration of right eye 03/03/2013    Priority: Medium  . Glaucoma 03/03/2013    Priority: Medium  . Hyperlipidemia 03/14/2009    Priority: Medium  . Depression 10/19/2007    Priority: Medium  . Essential hypertension 10/19/2007    Priority: Medium  . GASTROESOPHAGEAL REFLUX DISEASE 10/19/2007    Priority: Medium  . Vaginal itching 04/04/2016    Priority: Low  . Post-traumatic headache 10/11/2015    Priority: Low  . Routine health maintenance 03/06/2013    Priority: Low  . Osteoarthritis 10/19/2007    Priority: Low  . STRESS INCONTINENCE 10/19/2007    Priority: Low  . Allergic state 10/19/2007    Priority: Low  . Nausea & vomiting 10/31/2017  . Chronic daily headache 07/08/2017    Medications- reviewed and updated Current Outpatient Medications  Medication Sig Dispense Refill  . acetaminophen (TYLENOL) 500 MG tablet Take 500 mg by mouth daily as needed for mild pain or headache.     . ALPRAZolam (XANAX) 0.5 MG tablet Take 0.5-1 tablets (0.25-0.5 mg total) by mouth 2 (two) times daily as needed. for anxiety 20 tablet 0  . aspirin EC 81 MG tablet Take 81 mg by mouth daily.    . brimonidine-timolol (COMBIGAN) 0.2-0.5 % ophthalmic solution Place 1 drop into both eyes every 12 (twelve) hours.    . clobetasol ointment (TEMOVATE) 0.05 % Apply topically 2 (two) times daily. Do not use for more than 7 days in a row. 30 g 1  . ezetimibe-simvastatin (VYTORIN) 10-20 MG  tablet Take 1 tablet by mouth daily. 90 tablet 3  . ibuprofen (ADVIL,MOTRIN) 200 MG tablet Take 200 mg by mouth every 8 (eight) hours as needed.    . nadolol (CORGARD) 40 MG tablet Take 1 tablet (40 mg total) by mouth at bedtime. 90 tablet 1  . omeprazole (PRILOSEC) 20 MG capsule Take 20 mg by mouth daily before breakfast.    . sodium chloride (OCEAN) 0.65 % SOLN nasal spray Place 1 spray into both nostrils 2 (two) times daily as needed for congestion.    . sulfamethoxazole-trimethoprim (BACTRIM DS,SEPTRA DS) 800-160 MG tablet Take 1 tablet by mouth 2 (two) times daily. 10 tablet 0  . traZODone (DESYREL) 100 MG tablet TAKE 1/4 TO 1/2 TABLET BY MOUTH AT BEDTIME IF NEEDED FOR SLEEP 15 tablet 1  . triamcinolone ointment (KENALOG) 0.5 % Apply 1 application topically 2 (two) times daily. 30 g 0  . venlafaxine XR (EFFEXOR-XR) 150 MG 24 hr capsule TAKE 1 CAPSULE BY MOUTH TWICE A DAY 60 capsule 5   No current facility-administered medications for this visit.     Objective: BP 102/68 (BP Location: Left Arm, Patient Position: Sitting, Cuff Size: Large)   Pulse 68   Temp 97.6 F (36.4 C) (Oral)   Ht 5' (1.524 m)   Wt 162 lb (73.5 kg)   LMP 08/11/1974   SpO2 96%   BMI 31.64 kg/m  Gen: NAD, resting comfortably CV: RRR no murmurs rubs or gallops Lungs: CTAB no crackles, wheeze, rhonchi Abdomen: soft/nontender/nondistended Ext: trace edema bilaterally Skin: warm, dry, 3 erythematous patches none bigger than 2 x 2 cm on right forearm.  Also has a 1 to 1.5 cm in diameter circular rash on left lower leg with scaling at the edges pictured below     Assessment/Plan:  Rash S: has noted 3-4 red patches on right forearm several weeks ago- not getting bigger. Does not itch. She admits could have hit the area.   Left lower leg has circular lesion with circular lesion for several years- seems to be growing some.   Also has rash In bilateral groin-feels like the dependz irritate her ROS-not ill  appearing, no fever/chills. No new medications (other than bactrim and triamcinolone  per GYN but rash preceeded taking this antibiotic) . Not immunocompromised. No mucus membrane involvement.  A/P: Rash of unclear cause on right arm -we will try steroid-we will refer to dermatology at her request.  I do not really think the lesion on her leg is ringworm given how long it has been present-we will cover it with ketoconazole in case and this will be evaluated by dermatology.  Patient seems to have some contact irritation from her dependz- you can clearly see the lines from the dependz in the groin-we will try  Barrier with vaseline.  From AVS:  " We will call you within two weeks about your referral to [facility] dermatology to evaluate lesion on your leg as well as rash on arm. If you do not hear within 3 weeks, give [place] a call.   Try ketoconazole cream that I sent in today for the spot on the left lower leg for up to a month once a day (in case its ringworm though I dont feel strongly it is)   Try some Vaseline in the groin area as protective barrier from the dependz  It would be reasonable to trial the Kenalog ointment that your gynecologist gave you for the spots on the arm-if they worsen please let us know and stop using it "  Hyperlipidemia She is already on Vytorin-we discussed with no history of heart attack or stroke she may stop her aspirin  Liver lesion Not ready for liver MRI yet-they will reconsider at next visit   We have scheduled follow-up in a month- not sure if patient will have seen dermatology by that time.  Happy to see patient back sooner for new or worsening symptoms.  Future Appointments  Date Time Provider [place]  01/18/2018 11:45 AM Danie Binder, PT [facility]  01/21/2018 10:15 AM Danie Binder, PT [facility]  01/29/2018  2:30 PM Megan Salon, MD [place] None  02/17/2018 10:15 AM Marin Olp, MD [facility]  05/14/2018  9:30 AM Megan Salon, MD [place] None    Lab/Order associations: Skin lesion - Plan: Ambulatory referral to Dermatology  Meds ordered this encounter  Medications  . ketoconazole (NIZORAL) 2 % cream    Sig: Apply 1 application topically daily. For circular area on left lower leg    Dispense:  30 g    Refill:  0    Return precautions advised.  Garret Reddish, MD

## 2018-01-15 NOTE — Assessment & Plan Note (Signed)
Not ready for liver MRI yet-they will reconsider at next visit

## 2018-01-15 NOTE — Patient Instructions (Addendum)
Stop aspirin  We will call you within two weeks about your referral to [facility] dermatology to evaluate lesion on your leg as well as rash on arm. If you do not hear within 3 weeks, give [place] a call.   Try ketoconazole cream that I sent in today for the spot on the left lower leg for up to a month once a day (in case its ringworm though I dont feel strongly it is)   Try some Vaseline in the groin area as protective barrier from the dependz  It would be reasonable to trial the Kenalog ointment that your gynecologist gave you for the spots on the arm-if they worsen please let us know and stop using it

## 2018-01-15 NOTE — Assessment & Plan Note (Signed)
She is already on Vytorin-we discussed with no history of heart attack or stroke she may stop her aspirin

## 2018-01-18 ENCOUNTER — Ambulatory Visit: Payer: Medicare Other | Attending: Neurosurgery

## 2018-01-18 DIAGNOSIS — R2681 Unsteadiness on feet: Secondary | ICD-10-CM | POA: Insufficient documentation

## 2018-01-18 DIAGNOSIS — M545 Low back pain, unspecified: Secondary | ICD-10-CM

## 2018-01-18 DIAGNOSIS — G8929 Other chronic pain: Secondary | ICD-10-CM | POA: Insufficient documentation

## 2018-01-18 DIAGNOSIS — Z9181 History of falling: Secondary | ICD-10-CM | POA: Diagnosis present

## 2018-01-18 DIAGNOSIS — M6281 Muscle weakness (generalized): Secondary | ICD-10-CM | POA: Insufficient documentation

## 2018-01-18 NOTE — Therapy (Signed)
[facility] [address], [place], [place], [address] Phone: [phone]   Fax:  [phone]  Physical Therapy Treatment  Patient Details  Name: [name] MRN: [id number] Date of Birth: [birth year] Referring Provider: Jovita Gamma, MD   Encounter Date: 01/18/2018  PT End of Session - 01/18/18 1225    Visit Number  8    Date for PT Re-Evaluation  02/01/18    Authorization Type  [employer] Medicare    PT Start Time  1151    PT Stop Time  1233    PT Time Calculation (min)  42 min    Equipment Utilized During Treatment  Back brace    Activity Tolerance  Patient tolerated treatment well    Behavior During Therapy  [facility] for tasks assessed/performed       Past Medical History:  Diagnosis Date  . Anxiety   . Colon polyps   . Complication of anesthesia    takes longer to wake up- admitted overnight after colonoscopy  . Depression   . GERD (gastroesophageal reflux disease)   . Glaucoma   . Headache    migraines  . Hx: UTI (urinary tract infection)   . Hypercholesteremia   . Hypertension   . Lichen simplex chronicus   . Lumbar stenosis with neurogenic claudication   . Macular degeneration   . Osteoarthritis   . Osteopenia    of the hip  . PONV (postoperative nausea and vomiting)   . Stress incontinence, female   . Unsteady gait   . Urinary urgency   . Wears glasses   . Whooping cough    as a baby    Past Surgical History:  Procedure Laterality Date  . ABDOMINAL HYSTERECTOMY     ovaries remain  . ANTERIOR LAT LUMBAR FUSION N/A 10/26/2017   Procedure: LUMBAR THREE- LUMBAR FOUR, LUMBAR FOUR- LUMBAR FIVE ANTEROLATERAL LUMBAR INTERBODY ARTHRODESIS;  Surgeon: Jovita Gamma, MD;  Location: [place];  Service: Neurosurgery;  Laterality: N/A;  LUMBAR 3- LUMBAR 4, LUMBAR 4- LUMBAR 5 ANTEROLATERAL LUMBAR INTERBODY ARTHRODESIS, LUMBAR 3- LUMBAR 4, LUMBAR 4- LUMBAR 5 PERCUTANEOUS PEDICLE SCREW FIXATION  . APPENDECTOMY     . BLADDER REPAIR     x two  . CHOLECYSTECTOMY    . COLONOSCOPY    . EYE SURGERY  11/14, 12/14   Macular Dengeneration, Glaucoma  . EYE SURGERY  2016   left eye  . LESION REMOVAL Right 06/28/2013   Procedure: EXCISION 3 cm right labial sebaceous cyst;  Surgeon: Lyman Speller, MD;  Location: [facility];  Service: Gynecology;  Laterality: Right;  . LUMBAR LAMINECTOMY/DECOMPRESSION MICRODISCECTOMY Right 03/20/2016   Procedure: Right - Lumbar four-five lumbar laminotomy, foraminotomy, and possible microdiscectomy;  Surgeon: Jovita Gamma, MD;  Location: [facility];  Service: Neurosurgery;  Laterality: Right;  right  . LUMBAR PERCUTANEOUS PEDICLE SCREW 2 LEVEL  10/26/2017   Procedure: LUMBAR THREE- LUMBAR FOUR, LUMBAR FOUR- LUMBAR FIVE PERCUTANEOUS PEDICLE SCREW FIXATION;  Surgeon: Jovita Gamma, MD;  Location: [facility];  Service: Neurosurgery;;  . MOHS SURGERY     procedure to remove basal cell  . TONSILLECTOMY AND ADENOIDECTOMY    . URETHRAL SLING  2007  . URETHRAL SLING  1/12   midurethral   . vertebroplasty secondary to traumatic compression fracture      There were no vitals filed for this visit.  Subjective Assessment - 01/18/18 1155    Subjective  I felt sick on my stomach after last  session.  Not sure why.  Pt had to cancel last week so a 10 day lapse in treatment.      Currently in Pain?  No/denies         [facility] PT Assessment - 01/18/18 0001      Transfers   Five time sit to stand comments   19.12-some use of arms      Standardized Balance Assessment   Standardized Balance Assessment  Timed Up and Go Test      Timed Up and Go Test   Normal TUG (seconds)  13.29 without cane                   [facility] Adult PT Treatment/Exercise - 01/18/18 0001      Lumbar Exercises: Aerobic   Nustep  Level 2 x 10 minutes       Lumbar Exercises: Standing   Heel Raises  20 reps      Lumbar Exercises: Seated   Long Arc Quad on Chair  Both;10 reps;Weights    LAQ on  Chair Weights (lbs)  2    Sit to Stand  15 reps 3x5 without hands    Other Seated Lumbar Exercises  shoulder flexion and scaption 1# 2x10      Knee/Hip Exercises: Standing   Hip Abduction  Both;Knee straight;20 reps    Hip Extension  Both;Knee straight;20 reps    Rocker Board  3 minutes    Rebounder  weight shifting 3 ways x 1 minutes       Knee/Hip Exercises: Seated   Marching  Strengthening;Both;2 sets;10 reps    Marching Limitations  2# added               PT Short Term Goals - 12/21/17 1456      PT SHORT TERM GOAL #1   Title  verbalize understanding of posture/body mechanics to decrease risk of reinjury    Status  Achieved      PT SHORT TERM GOAL #2   Title  improve functional strength by demonstrating ability to stand without UE support at least 3/5 trials    Status  Achieved      PT SHORT TERM GOAL #3   Title  improve timed up and go to < 20 sec for improved functional mobility    Baseline  25.95 seconds    Period  Weeks    Status  On-going        PT Long Term Goals - 01/18/18 1157      PT LONG TERM GOAL #1   Title  independent with HEP    Status  On-going      PT LONG TERM GOAL #5   Title  report pain < 4/10 with activity for improved functional mobility    Status  Achieved            Plan - 01/18/18 1208    Clinical Impression Statement  Pt with a lapse in treatment for 10 days.  Pt without cane today and demonstrates good stability for short distances.  Pt reports that she is not using cane for household distances.  TUG is improved to 13.29 seconds and 5x sit to stand to 19.12 seconds indicating improved balance.  Pt demonstrates moderate UE support with sit to stand.  Pt required stand by assistance and verbal cues for technique and safety.  Pt with continued core and hip weakness s/p lumbar fusion and will continue to benefit from skilled PT for LE strength,  core strength and balance.      Rehab Potential  Good    PT Frequency  2x / week     PT Duration  6 weeks    PT Treatment/Interventions  ADLs/Self Care Home Management;Cryotherapy;Electrical Stimulation;Moist Heat;Traction;Therapeutic exercise;Therapeutic activities;Functional mobility training;Stair training;Gait training;DME Instruction;Ultrasound;Balance training;Neuromuscular re-education;Patient/family education;Manual techniques;Taping;Dry needling    PT Next Visit Plan  core/hip strengthening maintaining neutral spine, balance, work on sit to stand    Consulted and Agree with Plan of Care  Patient       Patient will benefit from skilled therapeutic intervention in order to improve the following deficits and impairments:  Abnormal gait, Decreased balance, Decreased cognition, Decreased knowledge of use of DME, Decreased mobility, Decreased safety awareness, Impaired flexibility, Postural dysfunction, Improper body mechanics, Impaired perceived functional ability, Difficulty walking, Increased muscle spasms, Pain, Increased fascial restricitons, Decreased strength, Decreased endurance  Visit Diagnosis: Chronic right-sided low back pain without sciatica  Muscle weakness (generalized)  History of falling  Unsteadiness on feet     Problem List Patient Active Problem List   Diagnosis Date Noted  . Liver lesion 11/10/2017  . Nausea & vomiting 10/31/2017  . Chronic daily headache 07/08/2017  . Memory changes 03/06/2017  . Vaginal itching 04/04/2016  . Lumbar stenosis with neurogenic claudication 03/20/2016  . Scalp pain 10/22/2015  . Post-traumatic headache 10/11/2015  . Routine health maintenance 03/06/2013  . Macular degeneration of right eye 03/03/2013  . Glaucoma 03/03/2013  . Hyperlipidemia 03/14/2009  . Depression 10/19/2007  . Essential hypertension 10/19/2007  . GASTROESOPHAGEAL REFLUX DISEASE 10/19/2007  . Osteoarthritis 10/19/2007  . STRESS INCONTINENCE 10/19/2007  . Allergic state 10/19/2007     Sigurd Sos, PT 01/18/18 12:27 PM  [facility] [address], [place], [place], [address] Phone: [phone]   Fax:  [phone]  Name: [name] MRN: [id number] Date of Birth: 04/10/1936

## 2018-01-21 ENCOUNTER — Ambulatory Visit: Payer: Medicare Other

## 2018-01-21 DIAGNOSIS — Z9181 History of falling: Secondary | ICD-10-CM

## 2018-01-21 DIAGNOSIS — M545 Low back pain, unspecified: Secondary | ICD-10-CM

## 2018-01-21 DIAGNOSIS — R2681 Unsteadiness on feet: Secondary | ICD-10-CM

## 2018-01-21 DIAGNOSIS — G8929 Other chronic pain: Secondary | ICD-10-CM

## 2018-01-21 DIAGNOSIS — M6281 Muscle weakness (generalized): Secondary | ICD-10-CM

## 2018-01-21 NOTE — Therapy (Signed)
[facility] [address] [place], [place], [address] Phone: [phone]   Fax:  [phone]  Physical Therapy Treatment  Patient Details  Name: [name] MRN: [id number] Date of Birth: 1935/09/18 Referring Provider: Jovita Gamma, MD   Encounter Date: 01/21/2018  PT End of Session - 01/21/18 1049    Visit Number  9    Number of Visits  16    Date for PT Re-Evaluation  02/01/18    Authorization Type  [employer] Medicare    PT Start Time  1016    PT Stop Time  1100    PT Time Calculation (min)  44 min    Equipment Utilized During Treatment  Back brace    Activity Tolerance  Patient tolerated treatment well    Behavior During Therapy  [facility] for tasks assessed/performed       Past Medical History:  Diagnosis Date  . Anxiety   . Colon polyps   . Complication of anesthesia    takes longer to wake up- admitted overnight after colonoscopy  . Depression   . GERD (gastroesophageal reflux disease)   . Glaucoma   . Headache    migraines  . Hx: UTI (urinary tract infection)   . Hypercholesteremia   . Hypertension   . Lichen simplex chronicus   . Lumbar stenosis with neurogenic claudication   . Macular degeneration   . Osteoarthritis   . Osteopenia    of the hip  . PONV (postoperative nausea and vomiting)   . Stress incontinence, female   . Unsteady gait   . Urinary urgency   . Wears glasses   . Whooping cough    as a baby    Past Surgical History:  Procedure Laterality Date  . ABDOMINAL HYSTERECTOMY     ovaries remain  . ANTERIOR LAT LUMBAR FUSION N/A 10/26/2017   Procedure: LUMBAR THREE- LUMBAR FOUR, LUMBAR FOUR- LUMBAR FIVE ANTEROLATERAL LUMBAR INTERBODY ARTHRODESIS;  Surgeon: Jovita Gamma, MD;  Location: [facility];  Service: Neurosurgery;  Laterality: N/A;  LUMBAR 3- LUMBAR 4, LUMBAR 4- LUMBAR 5 ANTEROLATERAL LUMBAR INTERBODY ARTHRODESIS, LUMBAR 3- LUMBAR 4, LUMBAR 4- LUMBAR 5 PERCUTANEOUS PEDICLE SCREW  FIXATION  . APPENDECTOMY    . BLADDER REPAIR     x two  . CHOLECYSTECTOMY    . COLONOSCOPY    . EYE SURGERY  11/14, 12/14   Macular Dengeneration, Glaucoma  . EYE SURGERY  2016   left eye  . LESION REMOVAL Right 06/28/2013   Procedure: EXCISION 3 cm right labial sebaceous cyst;  Surgeon: Lyman Speller, MD;  Location: [facility];  Service: Gynecology;  Laterality: Right;  . LUMBAR LAMINECTOMY/DECOMPRESSION MICRODISCECTOMY Right 03/20/2016   Procedure: Right - Lumbar four-five lumbar laminotomy, foraminotomy, and possible microdiscectomy;  Surgeon: Jovita Gamma, MD;  Location: [facility];  Service: Neurosurgery;  Laterality: Right;  right  . LUMBAR PERCUTANEOUS PEDICLE SCREW 2 LEVEL  10/26/2017   Procedure: LUMBAR THREE- LUMBAR FOUR, LUMBAR FOUR- LUMBAR FIVE PERCUTANEOUS PEDICLE SCREW FIXATION;  Surgeon: Jovita Gamma, MD;  Location: [facility];  Service: Neurosurgery;;  . MOHS SURGERY     procedure to remove basal cell  . TONSILLECTOMY AND ADENOIDECTOMY    . URETHRAL SLING  2007  . URETHRAL SLING  1/12   midurethral   . vertebroplasty secondary to traumatic compression fracture      There were no vitals filed for this visit.  Subjective Assessment - 01/21/18 1031    Subjective  I didn't feel bad after last session.  I feel like I am getting stronger.    Currently in Pain?  No/denies                       [facility] Adult PT Treatment/Exercise - 01/21/18 0001      Lumbar Exercises: Aerobic   Nustep  Level 2 x 10 minutes  PT present to discuss progress      Lumbar Exercises: Standing   Heel Raises  20 reps      Lumbar Exercises: Seated   Long Arc Quad on Chair  Both;10 reps;Weights    LAQ on Chair Weights (lbs)  2    Sit to Stand  15 reps 3x5 without hands    Other Seated Lumbar Exercises  shoulder flexion and scaption 1# 2x10      Knee/Hip Exercises: Standing   Hip Abduction  Both;Knee straight;20 reps    Abduction Limitations  2#    Hip Extension   Both;Knee straight;20 reps    Extension Limitations  2#    Functional Squat  2 sets;10 reps    Rocker Board  3 minutes    Rebounder  weight shifting 3 ways x 1 minutes       Knee/Hip Exercises: Seated   Marching  Strengthening;Both;2 sets;10 reps    Marching Limitations  2# added               PT Short Term Goals - 12/21/17 1456      PT SHORT TERM GOAL #1   Title  verbalize understanding of posture/body mechanics to decrease risk of reinjury    Status  Achieved      PT SHORT TERM GOAL #2   Title  improve functional strength by demonstrating ability to stand without UE support at least 3/5 trials    Status  Achieved      PT SHORT TERM GOAL #3   Title  improve timed up and go to < 20 sec for improved functional mobility    Baseline  25.95 seconds    Period  Weeks    Status  On-going        PT Long Term Goals - 01/18/18 1157      PT LONG TERM GOAL #1   Title  independent with HEP    Status  On-going      PT LONG TERM GOAL #5   Title  report pain < 4/10 with activity for improved functional mobility    Status  Achieved            Plan - 01/21/18 1050    Clinical Impression Statement  Pt reports that she is feeling stronger overall.  Pt requires moderate UE support with sit to stand and demonstrates instability with standing at times.  Close supervision is required for safety with exercise.  Pt tolerated advancement of standing exercises with 2# ankle weights.  Pt with core and hip weakness s/p lumbar fusion and will continue to benefit from skilled PT for LE strength, core strength and balance.      Rehab Potential  Good    PT Frequency  2x / week    PT Duration  6 weeks    PT Treatment/Interventions  ADLs/Self Care Home Management;Cryotherapy;Electrical Stimulation;Moist Heat;Traction;Therapeutic exercise;Therapeutic activities;Functional mobility training;Stair training;Gait training;DME Instruction;Ultrasound;Balance training;Neuromuscular  re-education;Patient/family education;Manual techniques;Taping;Dry needling    PT Next Visit Plan  core/hip strengthening maintaining neutral spine, balance, work on sit to stand.  10th  visit progress note next session     PT Home Exercise Plan  Access Code: [email or web address]    Consulted and Agree with Plan of Care  Patient       Patient will benefit from skilled therapeutic intervention in order to improve the following deficits and impairments:  Abnormal gait, Decreased balance, Decreased cognition, Decreased knowledge of use of DME, Decreased mobility, Decreased safety awareness, Impaired flexibility, Postural dysfunction, Improper body mechanics, Impaired perceived functional ability, Difficulty walking, Increased muscle spasms, Pain, Increased fascial restricitons, Decreased strength, Decreased endurance  Visit Diagnosis: Chronic right-sided low back pain without sciatica  Muscle weakness (generalized)  History of falling  Unsteadiness on feet     Problem List Patient Active Problem List   Diagnosis Date Noted  . Liver lesion 11/10/2017  . Nausea & vomiting 10/31/2017  . Chronic daily headache 07/08/2017  . Memory changes 03/06/2017  . Vaginal itching 04/04/2016  . Lumbar stenosis with neurogenic claudication 03/20/2016  . Scalp pain 10/22/2015  . Post-traumatic headache 10/11/2015  . Routine health maintenance 03/06/2013  . Macular degeneration of right eye 03/03/2013  . Glaucoma 03/03/2013  . Hyperlipidemia 03/14/2009  . Depression 10/19/2007  . Essential hypertension 10/19/2007  . GASTROESOPHAGEAL REFLUX DISEASE 10/19/2007  . Osteoarthritis 10/19/2007  . STRESS INCONTINENCE 10/19/2007  . Allergic state 10/19/2007     Sigurd Sos, PT 01/21/18 11:06 AM  [facility] [address] [place], [place], [address] Phone: [phone]   Fax:  [phone]  Name: [name] MRN: [id number] Date of Birth:  04-16-1936

## 2018-01-24 ENCOUNTER — Emergency Department (HOSPITAL_COMMUNITY): Payer: Medicare Other

## 2018-01-24 ENCOUNTER — Encounter (HOSPITAL_COMMUNITY): Payer: Self-pay | Admitting: Emergency Medicine

## 2018-01-24 ENCOUNTER — Observation Stay (HOSPITAL_BASED_OUTPATIENT_CLINIC_OR_DEPARTMENT_OTHER): Payer: Medicare Other

## 2018-01-24 ENCOUNTER — Other Ambulatory Visit: Payer: Self-pay

## 2018-01-24 ENCOUNTER — Observation Stay (HOSPITAL_COMMUNITY)
Admission: EM | Admit: 2018-01-24 | Discharge: 2018-01-25 | Disposition: A | Discharge status: Home / Self Care | Payer: Medicare Other | Attending: Internal Medicine | Admitting: Internal Medicine

## 2018-01-24 DIAGNOSIS — Z885 Allergy status to narcotic agent status: Secondary | ICD-10-CM | POA: Diagnosis not present

## 2018-01-24 DIAGNOSIS — I361 Nonrheumatic tricuspid (valve) insufficiency: Secondary | ICD-10-CM

## 2018-01-24 DIAGNOSIS — M21371 Foot drop, right foot: Secondary | ICD-10-CM | POA: Diagnosis not present

## 2018-01-24 DIAGNOSIS — H3530 Unspecified macular degeneration: Secondary | ICD-10-CM | POA: Insufficient documentation

## 2018-01-24 DIAGNOSIS — H409 Unspecified glaucoma: Secondary | ICD-10-CM | POA: Insufficient documentation

## 2018-01-24 DIAGNOSIS — K219 Gastro-esophageal reflux disease without esophagitis: Secondary | ICD-10-CM | POA: Diagnosis not present

## 2018-01-24 DIAGNOSIS — R079 Chest pain, unspecified: Secondary | ICD-10-CM | POA: Diagnosis present

## 2018-01-24 DIAGNOSIS — G43909 Migraine, unspecified, not intractable, without status migrainosus: Secondary | ICD-10-CM | POA: Insufficient documentation

## 2018-01-24 DIAGNOSIS — F32A Depression, unspecified: Secondary | ICD-10-CM | POA: Diagnosis present

## 2018-01-24 DIAGNOSIS — F419 Anxiety disorder, unspecified: Secondary | ICD-10-CM | POA: Insufficient documentation

## 2018-01-24 DIAGNOSIS — Z87891 Personal history of nicotine dependence: Secondary | ICD-10-CM | POA: Diagnosis not present

## 2018-01-24 DIAGNOSIS — M48062 Spinal stenosis, lumbar region with neurogenic claudication: Secondary | ICD-10-CM | POA: Diagnosis not present

## 2018-01-24 DIAGNOSIS — G8929 Other chronic pain: Secondary | ICD-10-CM | POA: Diagnosis not present

## 2018-01-24 DIAGNOSIS — R0789 Other chest pain: Secondary | ICD-10-CM | POA: Diagnosis not present

## 2018-01-24 DIAGNOSIS — E785 Hyperlipidemia, unspecified: Secondary | ICD-10-CM | POA: Diagnosis present

## 2018-01-24 DIAGNOSIS — F329 Major depressive disorder, single episode, unspecified: Secondary | ICD-10-CM | POA: Insufficient documentation

## 2018-01-24 DIAGNOSIS — R072 Precordial pain: Secondary | ICD-10-CM | POA: Diagnosis not present

## 2018-01-24 DIAGNOSIS — M1990 Unspecified osteoarthritis, unspecified site: Secondary | ICD-10-CM | POA: Diagnosis not present

## 2018-01-24 DIAGNOSIS — M8580 Other specified disorders of bone density and structure, unspecified site: Secondary | ICD-10-CM | POA: Diagnosis not present

## 2018-01-24 DIAGNOSIS — L280 Lichen simplex chronicus: Secondary | ICD-10-CM | POA: Diagnosis not present

## 2018-01-24 DIAGNOSIS — I10 Essential (primary) hypertension: Secondary | ICD-10-CM | POA: Diagnosis present

## 2018-01-24 DIAGNOSIS — Z981 Arthrodesis status: Secondary | ICD-10-CM | POA: Insufficient documentation

## 2018-01-24 DIAGNOSIS — E7800 Pure hypercholesterolemia, unspecified: Secondary | ICD-10-CM | POA: Insufficient documentation

## 2018-01-24 HISTORY — DX: Chest pain, unspecified: R07.9

## 2018-01-24 LAB — BASIC METABOLIC PANEL
ANION GAP: 8 (ref 5–15)
BUN: 13 mg/dL (ref 6–20)
CHLORIDE: 107 mmol/L (ref 101–111)
CO2: 25 mmol/L (ref 22–32)
CREATININE: 0.76 mg/dL (ref 0.44–1.00)
Calcium: 9.4 mg/dL (ref 8.9–10.3)
GFR calc Af Amer: >60 mL/min (ref >60)
GFR calc non Af Amer: >60 mL/min (ref >60)
Glucose, Bld: 106 mg/dL — ABNORMAL HIGH (ref 65–99)
Potassium: 4.3 mmol/L (ref 3.5–5.1)
Sodium: 140 mmol/L (ref 135–145)

## 2018-01-24 LAB — TROPONIN I
Troponin I: <0.03 ng/mL (ref <0.03)
Troponin I: <0.03 ng/mL (ref <0.03)

## 2018-01-24 LAB — CBC
HEMATOCRIT: 43.8 % (ref 36.0–46.0)
HEMOGLOBIN: 14 g/dL (ref 12.0–15.0)
MCH: 31.2 pg (ref 26.0–34.0)
MCHC: 32 g/dL (ref 30.0–36.0)
MCV: 97.6 fL (ref 78.0–100.0)
Platelets: 239 10*3/uL (ref 150–400)
RBC: 4.49 MIL/uL (ref 3.87–5.11)
RDW: 12.5 % (ref 11.5–15.5)
WBC: 6.7 10*3/uL (ref 4.0–10.5)

## 2018-01-24 LAB — ECHOCARDIOGRAM COMPLETE
HEIGHTINCHES: 60 in
WEIGHTICAEL: 2563.2 [oz_av]

## 2018-01-24 LAB — I-STAT TROPONIN, ED: Troponin i, poc: 0 ng/mL (ref 0.00–0.08)

## 2018-01-24 MED ORDER — ACETAMINOPHEN 325 MG PO TABS: 650.0000 mg | ORAL_TABLET | ORAL | Status: DC | PRN

## 2018-01-24 MED ORDER — VENLAFAXINE HCL ER 150 MG PO CP24
150.0000 mg | ORAL_CAPSULE | Freq: Two times a day (BID) | ORAL | Status: DC
  Administered 2018-01-24 – 2018-01-25 (×2): 150 mg via ORAL
  Filled 2018-01-24 (×2): qty 2
  Filled 2018-01-24: qty 1
  Filled 2018-01-24: qty 2
  Filled 2018-01-24: qty 1

## 2018-01-24 MED ORDER — TRAZODONE HCL 50 MG PO TABS
25.0000 mg | ORAL_TABLET | Freq: Every evening | ORAL | Status: DC | PRN
Start: 2018-01-24 — End: 2018-01-25

## 2018-01-24 MED ORDER — ALPRAZOLAM 0.25 MG PO TABS: 0.2500 mg | ORAL_TABLET | Freq: Two times a day (BID) | ORAL | Status: DC | PRN

## 2018-01-24 MED ORDER — NADOLOL 40 MG PO TABS
40.0000 mg | ORAL_TABLET | Freq: Every day | ORAL | Status: DC
  Administered 2018-01-24: 40 mg via ORAL
  Filled 2018-01-24: qty 1

## 2018-01-24 MED ORDER — PANTOPRAZOLE SODIUM 40 MG PO TBEC
40.0000 mg | DELAYED_RELEASE_TABLET | Freq: Every day | ORAL | Status: DC
  Administered 2018-01-25: 40 mg via ORAL
  Filled 2018-01-24: qty 1

## 2018-01-24 MED ORDER — ONDANSETRON HCL 4 MG/2ML IJ SOLN: 4.0000 mg | Freq: Four times a day (QID) | INTRAMUSCULAR | Status: DC | PRN

## 2018-01-24 MED ORDER — MORPHINE SULFATE (PF) 4 MG/ML IV SOLN: 1.0000 mg | INTRAVENOUS | Status: DC | PRN

## 2018-01-24 MED ORDER — EZETIMIBE-SIMVASTATIN 10-20 MG PO TABS
1.0000 | ORAL_TABLET | Freq: Every day | ORAL | Status: DC
  Filled 2018-01-24: qty 1

## 2018-01-24 MED ORDER — SODIUM CHLORIDE 0.9 % IV SOLN
INTRAVENOUS | Status: DC
  Administered 2018-01-24 – 2018-01-25 (×2): via INTRAVENOUS

## 2018-01-24 MED ORDER — NITROGLYCERIN 0.3 MG SL SUBL
0.3000 mg | SUBLINGUAL_TABLET | SUBLINGUAL | Status: DC | PRN
  Filled 2018-01-24: qty 100

## 2018-01-24 MED ORDER — KETOCONAZOLE 2 % EX CREA
1.0000 "application " | TOPICAL_CREAM | Freq: Every day | CUTANEOUS | Status: DC
  Administered 2018-01-24 – 2018-01-25 (×2): 1 via TOPICAL
  Filled 2018-01-24: qty 15

## 2018-01-24 MED ORDER — SALINE SPRAY 0.65 % NA SOLN
1.0000 | Freq: Two times a day (BID) | NASAL | Status: DC | PRN
  Filled 2018-01-24: qty 44

## 2018-01-24 MED ORDER — BRIMONIDINE TARTRATE-TIMOLOL 0.2-0.5 % OP SOLN
1.0000 [drp] | Freq: Two times a day (BID) | OPHTHALMIC | Status: DC
  Filled 2018-01-24: qty 5

## 2018-01-24 MED ORDER — ENOXAPARIN SODIUM 40 MG/0.4ML ~~LOC~~ SOLN
40.0000 mg | SUBCUTANEOUS | Status: DC
  Administered 2018-01-24: 40 mg via SUBCUTANEOUS
  Filled 2018-01-24 (×2): qty 0.4

## 2018-01-24 MED ORDER — BRIMONIDINE TARTRATE 0.2 % OP SOLN
1.0000 [drp] | Freq: Two times a day (BID) | OPHTHALMIC | Status: DC
  Administered 2018-01-24 – 2018-01-25 (×2): 1 [drp] via OPHTHALMIC
  Filled 2018-01-24: qty 5

## 2018-01-24 MED ORDER — TRIAMCINOLONE ACETONIDE 0.5 % EX OINT
1.0000 "application " | TOPICAL_OINTMENT | Freq: Two times a day (BID) | CUTANEOUS | Status: DC
  Administered 2018-01-24 – 2018-01-25 (×3): 1 via TOPICAL
  Filled 2018-01-24: qty 15

## 2018-01-24 MED ORDER — TIMOLOL MALEATE 0.5 % OP SOLN
1.0000 [drp] | Freq: Two times a day (BID) | OPHTHALMIC | Status: DC
  Administered 2018-01-24 – 2018-01-25 (×2): 1 [drp] via OPHTHALMIC
  Filled 2018-01-24: qty 5

## 2018-01-24 MED ORDER — ASPIRIN 81 MG PO CHEW
324.0000 mg | CHEWABLE_TABLET | Freq: Once | ORAL | Status: AC
  Administered 2018-01-24: 324 mg via ORAL
  Filled 2018-01-24: qty 4

## 2018-01-24 NOTE — H&P (Addendum)
History and Physical    [name] BMW:[id number] DOB: Nov 10, 1935 DOA: 01/24/2018   PCP: Marin Olp, MD   Patient coming from:  Home    Chief Complaint: Chest pain   HPI: [name] is a very pleasant  82 y.o. female with medical history significant fo hypertension, hyperlipidemia, chronic back pain status post lumbar discectomy, anxiety, GERD/ hiatal hernia, history of lichen simplex chronicus, macular degeneration, no prior history of heart disease, presenting with substernal chest pain beginning about 30 minutes prior to presentation, while at church, radiating to the right jaw lasting for a total of 1 h.  No radiation to the left arm.  No numbness or tingling.  She reports undergoing significant amount of stress as her  only child moving out of her home and so did her grandchildren.  Pain notworsened with deep inspiration, movement or exertion. Denies any dizziness or falls. No syncope or presyncope.  Denies any shortness of breath or cough, fever or chills. Denies any nausea, vomiting or abdominal pain. Appetite is normal  Denies any leg swelling or calf pain. SHe has Known R foot mild residual weakness from back surgery. Denies any headaches or vision changes. Denies any seizures No confusion reported. Never seen by a cardiologist . No recent long distance trips. Recent back surgery on march 2019  Denies any new stressors. No new meds. Not on hormonal therapy.  No new herbal supplements. Never smoked. No ETOH or recreational drugs. No fam hx except mother with CHF.   ED Course:  BP (!) 133/95   Pulse 65   Temp 98.2 F (36.8 C)   Resp 14   Ht 5\' 1"  (1.549 m)   Wt 72.6 kg (160 lb)   LMP 08/11/1974   SpO2 97%   BMI 30.23 kg/m   Chemistries normal.  Troponin 0.  White count 6.7.  Hemoglobin 14, platelets 239.  Glucose 106. Chest x-ray NAD EKG  Normal sinus rhythm Septal infarct , age undetermined No significant change since last tracing  Aspirin at the ED Heart  score 4-5 Never had an echocardiogram.  Review of Systems:  As per HPI otherwise all other systems reviewed and are negative  Past Medical History:  Diagnosis Date  . Anxiety   . Colon polyps   . Complication of anesthesia    takes longer to wake up- admitted overnight after colonoscopy  . Depression   . GERD (gastroesophageal reflux disease)   . Glaucoma   . Headache    migraines  . Hx: UTI (urinary tract infection)   . Hypercholesteremia   . Hypertension   . Lichen simplex chronicus   . Lumbar stenosis with neurogenic claudication   . Macular degeneration   . Osteoarthritis   . Osteopenia    of the hip  . PONV (postoperative nausea and vomiting)   . Stress incontinence, female   . Unsteady gait   . Urinary urgency   . Wears glasses   . Whooping cough    as a baby    Past Surgical History:  Procedure Laterality Date  . ABDOMINAL HYSTERECTOMY     ovaries remain  . ANTERIOR LAT LUMBAR FUSION N/A 10/26/2017   Procedure: LUMBAR THREE- LUMBAR FOUR, LUMBAR FOUR- LUMBAR FIVE ANTEROLATERAL LUMBAR INTERBODY ARTHRODESIS;  Surgeon: Jovita Gamma, MD;  Location: [facility];  Service: Neurosurgery;  Laterality: N/A;  LUMBAR 3- LUMBAR 4, LUMBAR 4- LUMBAR 5 ANTEROLATERAL LUMBAR INTERBODY ARTHRODESIS, LUMBAR 3- LUMBAR 4, LUMBAR 4- LUMBAR 5 PERCUTANEOUS PEDICLE  SCREW FIXATION  . APPENDECTOMY    . BLADDER REPAIR     x two  . CHOLECYSTECTOMY    . COLONOSCOPY    . EYE SURGERY  11/14, 12/14   Macular Dengeneration, Glaucoma  . EYE SURGERY  2016   left eye  . LESION REMOVAL Right 06/28/2013   Procedure: EXCISION 3 cm right labial sebaceous cyst;  Surgeon: Lyman Speller, MD;  Location: [facility];  Service: Gynecology;  Laterality: Right;  . LUMBAR LAMINECTOMY/DECOMPRESSION MICRODISCECTOMY Right 03/20/2016   Procedure: Right - Lumbar four-five lumbar laminotomy, foraminotomy, and possible microdiscectomy;  Surgeon: Jovita Gamma, MD;  Location: [facility];  Service: Neurosurgery;   Laterality: Right;  right  . LUMBAR PERCUTANEOUS PEDICLE SCREW 2 LEVEL  10/26/2017   Procedure: LUMBAR THREE- LUMBAR FOUR, LUMBAR FOUR- LUMBAR FIVE PERCUTANEOUS PEDICLE SCREW FIXATION;  Surgeon: Jovita Gamma, MD;  Location: [facility];  Service: Neurosurgery;;  . MOHS SURGERY     procedure to remove basal cell  . TONSILLECTOMY AND ADENOIDECTOMY    . URETHRAL SLING  2007  . URETHRAL SLING  1/12   midurethral   . vertebroplasty secondary to traumatic compression fracture      Social History Social History   Socioeconomic History  . Marital status: Married    Spouse name: Not on file  . Number of children: 2  . Years of education: [employer]  . Highest education level: Not on file  Occupational History  . Occupation: Retired  Scientific laboratory technician  . Financial resource strain: Not on file  . Food insecurity:    Worry: Not on file    Inability: Not on file  . Transportation needs:    Medical: Not on file    Non-medical: Not on file  Tobacco Use  . Smoking status: Former Smoker    Last attempt to quit: 08/11/1965    Years since quitting: 52.4  . Smokeless tobacco: Never Used  . Tobacco comment: has not smoked in 45 years   Substance and Sexual Activity  . Alcohol use: Yes    Alcohol/week: 0.6 oz    Types: 1 Standard drinks or equivalent per week    Comment: occ   . Drug use: No  . Sexual activity: Not on file    Comment: TAH  Lifestyle  . Physical activity:    Days per week: Not on file    Minutes per session: Not on file  . Stress: Not on file  Relationships  . Social connections:    Talks on phone: Not on file    Gets together: Not on file    Attends religious service: Not on file    Active member of club or organization: Not on file    Attends meetings of clubs or organizations: Not on file    Relationship status: Not on file  . Intimate partner violence:    Fear of current or ex partner: Not on file    Emotionally abused: Not on file    Physically abused: Not on file     Forced sexual activity: Not on file  Other Topics Concern  . Not on file  Social History Narrative   HSG, attended [employer]   Married '65   1 son '67, 1 Daughter '69; 5 grandchildren. 1 grandson dealing with drugs after divorce of parents. Oldest.    Work: retired Pharmacist, hospital   Marriage in good health   Former smoker   Right-handed   Caffeine: 11/2 cups per day  Allergies  Allergen Reactions  . Citrate Of Magnesia Other (See Comments)    confusion  . Codeine Nausea And Vomiting  . Shingrix [Zoster Vac Recomb Adjuvanted] Swelling and Rash    Rash and temp x several days. (low grade 100 -101 )     Family History  Problem Relation Age of Onset  . Congestive Heart Failure Mother   . Thyroid disease Mother   . Osteoporosis Mother   . Alcoholism Mother   . Prostate cancer Father   . Breast cancer Maternal Grandmother   . Hypertension Sister   . Thyroid disease Sister   . Cancer Sister        brain ?  Marland Kitchen Rheum arthritis Daughter        Prior to Admission medications   Medication Sig Start Date End Date Taking? Authorizing Provider  acetaminophen (TYLENOL) 500 MG tablet Take 500 mg by mouth daily as needed for mild pain or headache.     [provider]  ALPRAZolam Duanne Moron) 0.5 MG tablet Take 0.5-1 tablets (0.25-0.5 mg total) by mouth 2 (two) times daily as needed. for anxiety 11/10/17   Marin Olp, MD  brimonidine-timolol (COMBIGAN) 0.2-0.5 % ophthalmic solution Place 1 drop into both eyes every 12 (twelve) hours.    [provider]  clobetasol ointment (TEMOVATE) 0.05 % Apply topically 2 (two) times daily. Do not use for more than 7 days in a row. 03/26/17   Megan Salon, MD  ezetimibe-simvastatin (VYTORIN) 10-20 MG tablet Take 1 tablet by mouth daily. 10/10/17   Marin Olp, MD  ibuprofen (ADVIL,MOTRIN) 200 MG tablet Take 200 mg by mouth every 8 (eight) hours as needed.    [provider]  ketoconazole (NIZORAL) 2 % cream Apply 1  application topically daily. For circular area on left lower leg 01/15/18   Marin Olp, MD  nadolol (CORGARD) 40 MG tablet Take 1 tablet (40 mg total) by mouth at bedtime. 01/14/18   Marin Olp, MD  nitrofurantoin, macrocrystal-monohydrate, (MACROBID) 100 MG capsule Take 1 capsule (100 mg total) by mouth 2 (two) times daily. 01/15/18   Megan Salon, MD  omeprazole (PRILOSEC) 20 MG capsule Take 20 mg by mouth daily before breakfast.    [provider]  sodium chloride (OCEAN) 0.65 % SOLN nasal spray Place 1 spray into both nostrils 2 (two) times daily as needed for congestion.    [provider]  sulfamethoxazole-trimethoprim (BACTRIM DS,SEPTRA DS) 800-160 MG tablet Take 1 tablet by mouth 2 (two) times daily. 01/12/18   Megan Salon, MD  traZODone (DESYREL) 100 MG tablet TAKE 1/4 TO 1/2 TABLET BY MOUTH AT BEDTIME IF NEEDED FOR SLEEP 12/21/17   Marin Olp, MD  triamcinolone ointment (KENALOG) 0.5 % Apply 1 application topically 2 (two) times daily. 01/12/18   Megan Salon, MD  venlafaxine XR (EFFEXOR-XR) 150 MG 24 hr capsule TAKE 1 CAPSULE BY MOUTH TWICE A DAY 11/23/17   Marin Olp, MD     Physical Exam:  Vitals:   01/24/18 1300 01/24/18 1315 01/24/18 1330 01/24/18 1400  BP: 119/63  (!) 134/92 (!) 133/95  Pulse: 62 65 74 65  Resp: 15 13 17 14   Temp:      SpO2: 90% 91% 92% 97%  Weight:      Height:       Constitutional: NAD, calm, anxious appearing, looks younger than stated age  Eyes: PERRL, lids and conjunctivae normal ENMT: Mucous membranes are moist,  without exudate or lesions  Neck: normal, supple, no masses, no thyromegaly Respiratory: clear to auscultation bilaterally, no wheezing, no crackles. Normal respiratory effort  Cardiovascular: Regular rate and rhythm, soft 1/6  murmur, rubs or gallops. No extremity edema. 2+ pedal pulses. No carotid bruits.  Abdomen: Soft, non tender, No hepatosplenomegaly. Bowel sounds positive.  Musculoskeletal: no  clubbing / cyanosis. Moves all extremities Skin: no jaundice, no lesions Neurologic: Sensation intact  Strength equal in all extremities, known occasional  R foot drop while ambulating (port op) . Gait non tested  Psychiatric:   Alert and oriented x 3. Anxious      Labs on Admission: I have personally reviewed following labs and imaging studies  CBC: Recent Labs  Lab 01/24/18 1215  WBC 6.7  HGB 14.0  HCT 43.8  MCV 97.6  PLT 371    Basic Metabolic Panel: Recent Labs  Lab 01/24/18 1215  NA 140  K 4.3  CL 107  CO2 25  GLUCOSE 106*  BUN 13  CREATININE 0.76  CALCIUM 9.4    GFR: Estimated Creatinine Clearance: 51.1 mL/min (by C-G formula based on SCr of 0.76 mg/dL).  Liver Function Tests: No results for input(s): AST, ALT, ALKPHOS, BILITOT, PROT, ALBUMIN in the last 168 hours. No results for input(s): LIPASE, AMYLASE in the last 168 hours. No results for input(s): AMMONIA in the last 168 hours.  Coagulation Profile: No results for input(s): INR, PROTIME in the last 168 hours.  Cardiac Enzymes: No results for input(s): CKTOTAL, CKMB, CKMBINDEX, TROPONINI in the last 168 hours.  BNP (last 3 results) No results for input(s): PROBNP in the last 8760 hours.  HbA1C: No results for input(s): HGBA1C in the last 72 hours.  CBG: No results for input(s): GLUCAP in the last 168 hours.  Lipid Profile: No results for input(s): CHOL, HDL, LDLCALC, TRIG, CHOLHDL, LDLDIRECT in the last 72 hours.  Thyroid Function Tests: No results for input(s): TSH, T4TOTAL, FREET4, T3FREE, THYROIDAB in the last 72 hours.  Anemia Panel: No results for input(s): VITAMINB12, FOLATE, FERRITIN, TIBC, IRON, RETICCTPCT in the last 72 hours.  Urine analysis:    Component Value Date/Time   COLORURINE YELLOW 11/01/2017 1618   APPEARANCEUR HAZY (A) 11/01/2017 1618   LABSPEC 1.020 11/01/2017 1618   PHURINE 5.0 11/01/2017 1618   GLUCOSEU NEGATIVE 11/01/2017 1618   GLUCOSEU NEGATIVE 08/08/2013  1427   HGBUR NEGATIVE 11/01/2017 1618   BILIRUBINUR n 01/12/2018 1224   KETONESUR 80 (A) 11/01/2017 1618   PROTEINUR Negative 01/12/2018 1224   PROTEINUR NEGATIVE 11/01/2017 1618   UROBILINOGEN 0.2 01/12/2018 1224   UROBILINOGEN 0.2 08/08/2013 1427   NITRITE n 01/12/2018 1224   NITRITE NEGATIVE 11/01/2017 1618   LEUKOCYTESUR Negative 01/12/2018 1224    Sepsis Labs: @LABRCNTIP (procalcitonin:4,lacticidven:4) )No results found for this or any previous visit (from the past 240 hour(s)).   Radiological Exams on Admission: Dg Chest 2 View  Result Date: 01/24/2018 CLINICAL DATA:  Onset of weakness today. Right chest and mandible pain. EXAM: CHEST - 2 VIEW COMPARISON:  PA and lateral chest 10/30/2017. FINDINGS: Asymmetric elevation of the right hemidiaphragm relative to the left is unchanged. Lungs are clear. Heart size is normal. No pneumothorax or pleural effusion. No acute bony abnormality. The patient is status post L1 vertebral augmentation. IMPRESSION: No acute disease. Electronically Signed   By: Inge Rise M.D.   On: 01/24/2018 12:28    EKG: Independently reviewed. Normal sinus rhythm Septal infarct , age undetermined No significant change since  last tracing  Assessment/Plan Active Problems:   Hyperlipidemia   Depression   Essential hypertension   GASTROESOPHAGEAL REFLUX DISEASE   Osteoarthritis   Lumbar stenosis with neurogenic claudication   Chest pain    Chest pain syndrome, cardiac versus vs anxiety/ recent stressvs GI  HEART score 4-5 . Troponin , EKG without evidence of acute changes. CP unrelieved by nitroglycerin, morphine,  aspirin. CXR unrevealing. 2 D echo on LVF EF, gr  DD. Risk factors include Age, HTN, HLD.   Admit to Telemetry/ Observation Chest pain order set Cycle troponins EKG in am continue ASA, O2 and NTG as needed Continue preadmission beta blocker and nitrate Statins  Check Lipid panel  Hb A1C 2 D echo May need stress test while in  hospital. Will notify Cards    Hypertension BP 134/92   Pulse 74   Continue home anti-hypertensive medications in am (took her morning meds) Add Hydralazine Q6 hours as needed for BP 160/90   Hyperlipidemia Continue home statins LIpid panel  Anxiety Continue home Xanax  Depression Continue home  Effexor   GERD, h/o hiatal hernia dx 20 years ago, no recent endoscopy  Continue PPI, patient is intolerant to GI cocktail (confusion) May need GI eval as outpatient   History of Lumbar stenosis, sp recent back Surgery 10/2017 with residual mild R foot drop PT and OT if patient remains in hospital    DVT prophylaxis:  Lovenox Code Status:    Full  Family Communication:  Discussed with patient Disposition Plan: Expect patient to be discharged to home after condition improves Consults called:    None  Admission status: Tele Obs    Sharene Butters, PA-C [facility]   Amion text  [phone]   01/24/2018, 2:19 PM

## 2018-01-24 NOTE — ED Triage Notes (Signed)
Pt reports in church 30 minutes ago and began having centralized chest pain radiating into the right jaw. Pt tearful at triage, endorses recent stress due to only child moving. Pain 2/10.

## 2018-01-24 NOTE — Progress Notes (Signed)
*  PRELIMINARY RESULTS* Echocardiogram 2D Echocardiogram has been performed.  Leavy Cella 01/24/2018, 3:53 PM

## 2018-01-24 NOTE — ED Provider Notes (Addendum)
[facility] EMERGENCY DEPARTMENT Provider Note   CSN: [id number] Arrival date & time: 01/24/18  1202     History   Chief Complaint Chief Complaint  Patient presents with  . Chest Pain    HPI [name] is a 82 y.o. female.  Patient is an 82 year old female presenting today with chest pain that started while she was at church.  Patient states she was sitting there when she developed a severe discomfort in her chest that radiated into her right jaw and ear.  She had some mild dyspnea but denies any diaphoresis, nausea or vomiting.  She states she just did not feel very good.  The pain resolved upon arrival here.  She currently is pain-free.  She has never had anything like this before and she has no cardiac history.  Of note she was taking a baby aspirin but stopped a few weeks ago because her PCP took her off of it.  The history is provided by the patient.  Chest Pain   This is a new problem. The current episode started 1 to 2 hours ago. The problem occurs constantly. The problem has been resolved. Associated with: started while sitting in church. The pain is present in the substernal region. The pain is at a severity of 6/10. The pain is moderate. The quality of the pain is described as pressure-like. The pain radiates to the right jaw (right ear). Duration of episode(s) is 1 hour. Associated symptoms include shortness of breath. Pertinent negatives include no abdominal pain, no back pain, no cough, no exertional chest pressure, no fever, no irregular heartbeat, no leg pain, no lower extremity edema, no nausea, no palpitations and no vomiting. She has tried nothing for the symptoms. The treatment provided significant relief. Risk factors include being elderly.  Her past medical history is significant for hyperlipidemia and hypertension.  Pertinent negatives for past medical history include no diabetes.  Pertinent negatives for family medical history include: no CAD.      Past Medical History:  Diagnosis Date  . Anxiety   . Colon polyps   . Complication of anesthesia    takes longer to wake up- admitted overnight after colonoscopy  . Depression   . GERD (gastroesophageal reflux disease)   . Glaucoma   . Headache    migraines  . Hx: UTI (urinary tract infection)   . Hypercholesteremia   . Hypertension   . Lichen simplex chronicus   . Lumbar stenosis with neurogenic claudication   . Macular degeneration   . Osteoarthritis   . Osteopenia    of the hip  . PONV (postoperative nausea and vomiting)   . Stress incontinence, female   . Unsteady gait   . Urinary urgency   . Wears glasses   . Whooping cough    as a baby    Patient Active Problem List   Diagnosis Date Noted  . Liver lesion 11/10/2017  . Nausea & vomiting 10/31/2017  . Chronic daily headache 07/08/2017  . Memory changes 03/06/2017  . Vaginal itching 04/04/2016  . Lumbar stenosis with neurogenic claudication 03/20/2016  . Scalp pain 10/22/2015  . Post-traumatic headache 10/11/2015  . Routine health maintenance 03/06/2013  . Macular degeneration of right eye 03/03/2013  . Glaucoma 03/03/2013  . Hyperlipidemia 03/14/2009  . Depression 10/19/2007  . Essential hypertension 10/19/2007  . GASTROESOPHAGEAL REFLUX DISEASE 10/19/2007  . Osteoarthritis 10/19/2007  . STRESS INCONTINENCE 10/19/2007  . Allergic state 10/19/2007    Past Surgical History:  Procedure Laterality Date  . ABDOMINAL HYSTERECTOMY     ovaries remain  . ANTERIOR LAT LUMBAR FUSION N/A 10/26/2017   Procedure: LUMBAR THREE- LUMBAR FOUR, LUMBAR FOUR- LUMBAR FIVE ANTEROLATERAL LUMBAR INTERBODY ARTHRODESIS;  Surgeon: Jovita Gamma, MD;  Location: [facility];  Service: Neurosurgery;  Laterality: N/A;  LUMBAR 3- LUMBAR 4, LUMBAR 4- LUMBAR 5 ANTEROLATERAL LUMBAR INTERBODY ARTHRODESIS, LUMBAR 3- LUMBAR 4, LUMBAR 4- LUMBAR 5 PERCUTANEOUS PEDICLE SCREW FIXATION  . APPENDECTOMY    . BLADDER REPAIR     x two  .  CHOLECYSTECTOMY    . COLONOSCOPY    . EYE SURGERY  11/14, 12/14   Macular Dengeneration, Glaucoma  . EYE SURGERY  2016   left eye  . LESION REMOVAL Right 06/28/2013   Procedure: EXCISION 3 cm right labial sebaceous cyst;  Surgeon: Lyman Speller, MD;  Location: [facility];  Service: Gynecology;  Laterality: Right;  . LUMBAR LAMINECTOMY/DECOMPRESSION MICRODISCECTOMY Right 03/20/2016   Procedure: Right - Lumbar four-five lumbar laminotomy, foraminotomy, and possible microdiscectomy;  Surgeon: Jovita Gamma, MD;  Location: [facility];  Service: Neurosurgery;  Laterality: Right;  right  . LUMBAR PERCUTANEOUS PEDICLE SCREW 2 LEVEL  10/26/2017   Procedure: LUMBAR THREE- LUMBAR FOUR, LUMBAR FOUR- LUMBAR FIVE PERCUTANEOUS PEDICLE SCREW FIXATION;  Surgeon: Jovita Gamma, MD;  Location: [facility];  Service: Neurosurgery;;  . MOHS SURGERY     procedure to remove basal cell  . TONSILLECTOMY AND ADENOIDECTOMY    . URETHRAL SLING  2007  . URETHRAL SLING  1/12   midurethral   . vertebroplasty secondary to traumatic compression fracture       OB History    Gravida  4   Para  2   Term      Preterm      AB      Living  2     SAB      TAB      Ectopic      Multiple      Live Births               Home Medications    Prior to Admission medications   Medication Sig Start Date End Date Taking? Authorizing Provider  acetaminophen (TYLENOL) 500 MG tablet Take 500 mg by mouth daily as needed for mild pain or headache.     [provider]  ALPRAZolam Duanne Moron) 0.5 MG tablet Take 0.5-1 tablets (0.25-0.5 mg total) by mouth 2 (two) times daily as needed. for anxiety 11/10/17   Marin Olp, MD  brimonidine-timolol (COMBIGAN) 0.2-0.5 % ophthalmic solution Place 1 drop into both eyes every 12 (twelve) hours.    [provider]  clobetasol ointment (TEMOVATE) 0.05 % Apply topically 2 (two) times daily. Do not use for more than 7 days in a row. 03/26/17   Megan Salon,  MD  ezetimibe-simvastatin (VYTORIN) 10-20 MG tablet Take 1 tablet by mouth daily. 10/10/17   Marin Olp, MD  ibuprofen (ADVIL,MOTRIN) 200 MG tablet Take 200 mg by mouth every 8 (eight) hours as needed.    [provider]  ketoconazole (NIZORAL) 2 % cream Apply 1 application topically daily. For circular area on left lower leg 01/15/18   Marin Olp, MD  nadolol (CORGARD) 40 MG tablet Take 1 tablet (40 mg total) by mouth at bedtime. 01/14/18   Marin Olp, MD  nitrofurantoin, macrocrystal-monohydrate, (MACROBID) 100 MG capsule Take 1 capsule (100 mg total) by mouth 2 (two) times daily. 01/15/18  Megan Salon, MD  omeprazole (PRILOSEC) 20 MG capsule Take 20 mg by mouth daily before breakfast.    [provider]  sodium chloride (OCEAN) 0.65 % SOLN nasal spray Place 1 spray into both nostrils 2 (two) times daily as needed for congestion.    [provider]  sulfamethoxazole-trimethoprim (BACTRIM DS,SEPTRA DS) 800-160 MG tablet Take 1 tablet by mouth 2 (two) times daily. 01/12/18   Megan Salon, MD  traZODone (DESYREL) 100 MG tablet TAKE 1/4 TO 1/2 TABLET BY MOUTH AT BEDTIME IF NEEDED FOR SLEEP 12/21/17   Marin Olp, MD  triamcinolone ointment (KENALOG) 0.5 % Apply 1 application topically 2 (two) times daily. 01/12/18   Megan Salon, MD  venlafaxine XR (EFFEXOR-XR) 150 MG 24 hr capsule TAKE 1 CAPSULE BY MOUTH TWICE A DAY 11/23/17   Marin Olp, MD    Family History Family History  Problem Relation Age of Onset  . Congestive Heart Failure Mother   . Thyroid disease Mother   . Osteoporosis Mother   . Alcoholism Mother   . Prostate cancer Father   . Breast cancer Maternal Grandmother   . Hypertension Sister   . Thyroid disease Sister   . Cancer Sister        brain ?  Marland Kitchen Rheum arthritis Daughter     Social History Social History   Tobacco Use  . Smoking status: Former Smoker    Last attempt to quit: 08/11/1965    Years since quitting: 52.4    . Smokeless tobacco: Never Used  . Tobacco comment: has not smoked in 45 years   Substance Use Topics  . Alcohol use: Yes    Alcohol/week: 0.6 oz    Types: 1 Standard drinks or equivalent per week    Comment: occ   . Drug use: No     Allergies   Codeine and Shingrix [zoster vac recomb adjuvanted]   Review of Systems Review of Systems  Constitutional: Negative for fever.  Respiratory: Positive for shortness of breath. Negative for cough.   Cardiovascular: Positive for chest pain. Negative for palpitations.  Gastrointestinal: Negative for abdominal pain, nausea and vomiting.  Musculoskeletal: Negative for back pain.  All other systems reviewed and are negative.    Physical Exam Updated Vital Signs BP 136/81   Pulse 78   Temp 98.2 F (36.8 C)   Resp 19   Ht 5\' 1"  (1.549 m)   Wt 72.6 kg (160 lb)   LMP 08/11/1974   SpO2 98%   BMI 30.23 kg/m   Physical Exam  Constitutional: She is oriented to person, place, and time. She appears well-developed and well-nourished. No distress.  HENT:  Head: Normocephalic and atraumatic.  Mouth/Throat: Oropharynx is clear and moist.  Eyes: Pupils are equal, round, and reactive to light. Conjunctivae and EOM are normal.  Neck: Normal range of motion. Neck supple.  Cardiovascular: Normal rate, regular rhythm and intact distal pulses.  No murmur heard. Pulmonary/Chest: Effort normal and breath sounds normal. No respiratory distress. She has no wheezes. She has no rales.  Abdominal: Soft. She exhibits no distension. There is no tenderness. There is no rebound and no guarding.  Musculoskeletal: Normal range of motion. She exhibits no edema or tenderness.  Well healed surgical scars over the lower back  Neurological: She is alert and oriented to person, place, and time.  Skin: Skin is warm and dry. No rash noted. No erythema.  Psychiatric: She has a normal mood and affect. Her  behavior is normal.  Nursing note and vitals  reviewed.    ED Treatments / Results  Labs (all labs ordered are listed, but only abnormal results are displayed) Labs Reviewed  BASIC METABOLIC PANEL - Abnormal; Notable for the following components:      Result Value   Glucose, Bld 106 (*)    All other components within normal limits  CBC  I-STAT TROPONIN, ED    EKG EKG Interpretation  Date/Time:  Sunday January 24 2018 12:08:45 EDT Ventricular Rate:  71 PR Interval:  174 QRS Duration: 60 QT Interval:  370 QTC Calculation: 402 R Axis:   5 Text Interpretation:  Normal sinus rhythm Septal infarct , age undetermined No significant change since last tracing Confirmed by Blanchie Dessert [id number]) on 01/24/2018 12:39:23 PM   Radiology Dg Chest 2 View  Result Date: 01/24/2018 CLINICAL DATA:  Onset of weakness today. Right chest and mandible pain. EXAM: CHEST - 2 VIEW COMPARISON:  PA and lateral chest 10/30/2017. FINDINGS: Asymmetric elevation of the right hemidiaphragm relative to the left is unchanged. Lungs are clear. Heart size is normal. No pneumothorax or pleural effusion. No acute bony abnormality. The patient is status post L1 vertebral augmentation. IMPRESSION: No acute disease. Electronically Signed   By: Inge Rise M.D.   On: 01/24/2018 12:28    Procedures Procedures (including critical care time)  Medications Ordered in ED Medications  aspirin chewable tablet 324 mg (has no administration in time range)     Initial Impression / Assessment and Plan / ED Course  I have reviewed the triage vital signs and the nursing notes.  Pertinent labs & imaging results that were available during my care of the patient were reviewed by me and considered in my medical decision making (see chart for details).     Pt with symptoms concerning for ACS. Heart score of 5. Associated symptoms include fatigue and SOB.  Low risk well's and low suspicion for PE, Dissection or lung pathology such as PNA. ASA given.  Pt is currently CP  free. EKG, CXR, CBC, BMP, and initial trop 44min after event was neg.  However given pt's age, risk factors and story feel she would benefit from a rule out.   Final Clinical Impressions(s) / ED Diagnoses   Final diagnoses:  Nonspecific chest pain    ED Discharge Orders    None       Blanchie Dessert, MD 01/24/18 1306    Blanchie Dessert, MD 01/24/18 1349

## 2018-01-25 ENCOUNTER — Encounter: Payer: Medicare Other | Admitting: Physical Therapy

## 2018-01-25 ENCOUNTER — Other Ambulatory Visit: Payer: Self-pay | Admitting: Physician Assistant

## 2018-01-25 DIAGNOSIS — E785 Hyperlipidemia, unspecified: Secondary | ICD-10-CM | POA: Diagnosis not present

## 2018-01-25 DIAGNOSIS — I10 Essential (primary) hypertension: Secondary | ICD-10-CM

## 2018-01-25 DIAGNOSIS — R079 Chest pain, unspecified: Secondary | ICD-10-CM

## 2018-01-25 DIAGNOSIS — R072 Precordial pain: Secondary | ICD-10-CM

## 2018-01-25 DIAGNOSIS — E7800 Pure hypercholesterolemia, unspecified: Secondary | ICD-10-CM | POA: Diagnosis not present

## 2018-01-25 DIAGNOSIS — K219 Gastro-esophageal reflux disease without esophagitis: Secondary | ICD-10-CM | POA: Diagnosis not present

## 2018-01-25 DIAGNOSIS — R0789 Other chest pain: Secondary | ICD-10-CM | POA: Diagnosis not present

## 2018-01-25 LAB — HEMOGLOBIN A1C
HEMOGLOBIN A1C: 5.2 % (ref 4.8–5.6)
MEAN PLASMA GLUCOSE: 102.54 mg/dL

## 2018-01-25 LAB — LIPID PANEL
CHOLESTEROL: 144 mg/dL (ref 0–200)
HDL: 41 mg/dL (ref >40)
LDL Cholesterol: 78 mg/dL (ref 0–99)
TRIGLYCERIDES: 125 mg/dL (ref <150)
Total CHOL/HDL Ratio: 3.5 RATIO
VLDL: 25 mg/dL (ref 0–40)

## 2018-01-25 NOTE — Care Management Note (Addendum)
Case Management Note  Patient Details  Name: [name] MRN: [id number] Date of Birth: 11-Jun-1936  Subjective/Objective:     Chest pain              Action/Plan: NCM spoke to pt and states she is going to [facility] for Outpt PT. Will fax order to [facility] to resume with dc summary.   Expected Discharge Date:  01/25/18               Expected Discharge Plan:  OP Rehab  In-House Referral:  NA  Discharge planning Services  CM Consult  Post Acute Care Choice:  NA Choice offered to:  NA  DME Arranged:  N/A DME Agency:  NA  HH Arranged:  NA HH Agency:  NA  Status of Service:  Completed, signed off  If discussed at [facility] of Stay Meetings, dates discussed:    Additional Comments:  Erenest Rasher, RN 01/25/2018, 1:58 PM

## 2018-01-25 NOTE — Progress Notes (Signed)
patient discharged from unit to [facility] accompanied by husband. All personal belongings with patient. Discharge instructions, follow up appts, meds reviewed with patient. Pt demonstrates no distress.

## 2018-01-25 NOTE — Discharge Summary (Signed)
Physician Discharge Summary  [name] WPY:[id number] DOB: Apr 26, 1936 DOA: 01/24/2018  PCP: Marin Olp, MD  Admit date: 01/24/2018 Discharge date: 01/25/2018  Admitted From: home Discharge disposition: home   Recommendations for Outpatient Follow-Up:   Outpatient PT Outpatient stress test  Discharge Diagnosis:   Principal Problem:   Chest pain Active Problems:   Hyperlipidemia   Depression   Essential hypertension   GASTROESOPHAGEAL REFLUX DISEASE   Osteoarthritis   Lumbar stenosis with neurogenic claudication    Discharge Condition: Improved.  Diet recommendation: Low sodium, heart healthy  Wound care: None.  Code status: Full.   History of Present Illness:   82 year old female with an episode of typical sounding chest pain no prior cardiac evaluation.  She has a history of hypertension and hyperlipidemia and therefore will be admitted into the hospital in observation to rule out myocardial infarction.  We will touch base with cardiology regarding need for chemical stress test.   Hospital Course by Problem:   Chest pain -outpatient stress test -CE negative   -LDL: 78 -HgbA1c: 5.2  Hypertension Continue home anti-hypertensive medications in am (took her morning meds)   Hyperlipidemia Continue home statins LIpid panel  Anxiety Continue home Xanax  Depression Continue home  Effexor   GERD, h/o hiatal hernia dx 20 years ago, no recent endoscopy  Continue PPI, patient is intolerant to GI cocktail (confusion) -GI eval as outpatient if stress test normal  History of Lumbar stenosis, sp recent back Surgery 10/2017 with residual mild R foot drop PT outpatient      Medical Consultants:      Discharge Exam:   Vitals:   01/25/18 0745 01/25/18 1233  BP: (!) 146/72 (!) 145/69  Pulse: 68 68  Resp:    Temp: 97.6 F (36.4 C) 98.2 F (36.8 C)  SpO2: 98% 96%   Vitals:   01/25/18 0009 01/25/18 0423 01/25/18 0745  01/25/18 1233  BP: 105/64 (!) 117/57 (!) 146/72 (!) 145/69  Pulse: 67 67 68 68  Resp: 16 18    Temp: 97.7 F (36.5 C) 97.7 F (36.5 C) 97.6 F (36.4 C) 98.2 F (36.8 C)  TempSrc: Oral Oral Oral Oral  SpO2: 96% 98% 98% 96%  Weight:  73 kg (160 lb 14.4 oz)    Height:        General exam: Appears calm and comfortable.      The results of significant diagnostics from this hospitalization (including imaging, microbiology, ancillary and laboratory) are listed below for reference.     Procedures and Diagnostic Studies:   Dg Chest 2 View  Result Date: 01/24/2018 CLINICAL DATA:  Onset of weakness today. Right chest and mandible pain. EXAM: CHEST - 2 VIEW COMPARISON:  PA and lateral chest 10/30/2017. FINDINGS: Asymmetric elevation of the right hemidiaphragm relative to the left is unchanged. Lungs are clear. Heart size is normal. No pneumothorax or pleural effusion. No acute bony abnormality. The patient is status post L1 vertebral augmentation. IMPRESSION: No acute disease. Electronically Signed   By: Inge Rise M.D.   On: 01/24/2018 12:28     Labs:   Basic Metabolic Panel: Recent Labs  Lab 01/24/18 1215  NA 140  K 4.3  CL 107  CO2 25  GLUCOSE 106*  BUN 13  CREATININE 0.76  CALCIUM 9.4   GFR Estimated Creatinine Clearance: 50 mL/min (by C-G formula based on SCr of 0.76 mg/dL). Liver Function Tests: No results for input(s): AST, ALT, ALKPHOS, BILITOT,  PROT, ALBUMIN in the last 168 hours. No results for input(s): LIPASE, AMYLASE in the last 168 hours. No results for input(s): AMMONIA in the last 168 hours. Coagulation profile No results for input(s): INR, PROTIME in the last 168 hours.  CBC: Recent Labs  Lab 01/24/18 1215  WBC 6.7  HGB 14.0  HCT 43.8  MCV 97.6  PLT 239   Cardiac Enzymes: Recent Labs  Lab 01/24/18 1459 01/24/18 1656 01/24/18 1909  TROPONINI <0.03 <0.03 <0.03   BNP: Invalid input(s): POCBNP CBG: No results for input(s): GLUCAP in  the last 168 hours. D-Dimer No results for input(s): DDIMER in the last 72 hours. Hgb A1c Recent Labs    01/25/18 0432  HGBA1C 5.2   Lipid Profile Recent Labs    01/25/18 0432  CHOL 144  HDL 41  LDLCALC 78  TRIG 125  CHOLHDL 3.5   Thyroid function studies No results for input(s): TSH, T4TOTAL, T3FREE, THYROIDAB in the last 72 hours.  Invalid input(s): FREET3 Anemia work up No results for input(s): VITAMINB12, FOLATE, FERRITIN, TIBC, IRON, RETICCTPCT in the last 72 hours. Microbiology No results found for this or any previous visit (from the past 240 hour(s)).   Discharge Instructions:   Discharge Instructions    Diet - low sodium heart healthy   Complete by:  As directed    Discharge instructions   Complete by:  As directed    Outpatient follow up with cardiology for stress test   Increase activity slowly   Complete by:  As directed      Allergies as of 01/25/2018      Reactions   Citrate Of Magnesia Other (See Comments)   confusion   Codeine Nausea And Vomiting   Shingrix [zoster Vac Recomb Adjuvanted] Swelling, Rash   Rash and temp x several days. (low grade 100 -101 )       Medication List    STOP taking these medications   clobetasol ointment 0.05 % Commonly known as:  TEMOVATE   nitrofurantoin (macrocrystal-monohydrate) 100 MG capsule Commonly known as:  MACROBID   sulfamethoxazole-trimethoprim 800-160 MG tablet Commonly known as:  BACTRIM DS,SEPTRA DS     TAKE these medications   acetaminophen 500 MG tablet Commonly known as:  TYLENOL Take 500 mg by mouth daily as needed for mild pain or headache.   ALPRAZolam 0.5 MG tablet Commonly known as:  XANAX Take 0.5-1 tablets (0.25-0.5 mg total) by mouth 2 (two) times daily as needed. for anxiety   COMBIGAN 0.2-0.5 % ophthalmic solution Generic drug:  brimonidine-timolol Place 1 drop into both eyes every 12 (twelve) hours.   ezetimibe-simvastatin 10-20 MG tablet Commonly known as:   VYTORIN Take 1 tablet by mouth daily.   ibuprofen 200 MG tablet Commonly known as:  ADVIL,MOTRIN Take 200 mg by mouth every 8 (eight) hours as needed.   ketoconazole 2 % cream Commonly known as:  NIZORAL Apply 1 application topically daily. For circular area on left lower leg   nadolol 40 MG tablet Commonly known as:  CORGARD Take 1 tablet (40 mg total) by mouth at bedtime.   omeprazole 20 MG capsule Commonly known as:  PRILOSEC Take 20 mg by mouth daily before breakfast.   sodium chloride 0.65 % Soln nasal spray Commonly known as:  OCEAN Place 1 spray into both nostrils 2 (two) times daily as needed for congestion.   traZODone 100 MG tablet Commonly known as:  DESYREL TAKE 1/4 TO 1/2 TABLET BY MOUTH AT BEDTIME IF  NEEDED FOR SLEEP   triamcinolone ointment 0.5 % Commonly known as:  KENALOG Apply 1 application topically 2 (two) times daily.   venlafaxine XR 150 MG 24 hr capsule Commonly known as:  EFFEXOR-XR TAKE 1 CAPSULE BY MOUTH TWICE A DAY      Follow-up Information    [facility] Go on 02/05/2018.   Specialty:  Cardiology Why:  @10am  for stress test Contact information: [address] [place]       Imogene Burn, PA-C Follow up on 03/10/2018.   Specialty:  Cardiology Why:  @9 :30am for cardiology hospital follow up Contact information: [address] [place] [address] [phone]        Marin Olp, MD Follow up in 1 week(s).   Specialty:  Family Medicine Contact information: [address] [place] [address] [phone]            Time coordinating discharge: 35 min  Signed:  Geradine Girt  [facility] 01/25/2018, 1:36 PM

## 2018-01-25 NOTE — Evaluation (Signed)
Occupational Therapy Evaluation Patient Details Name: [name] MRN: [id number] DOB: [birth year] Today's Date: 01/25/2018    History of Present Illness  82 y.o. female with medical history significant fo hypertension, hyperlipidemia, chronic back pain status post lumbar discectomy, anxiety, GERD/ hiatal hernia, history of lichen simplex chronicus, macular degeneration, no prior history of heart disease, presenting with substernal chest pain   Clinical Impression   PTA, pt was living with her husband and performing BADLs and using SPC for functional mobility. Pt presenting near baseline function and performing ADLs and functional mobility with supervision for safety. Pt verbalizing compensatory techniques for LB ADLs for adherence to back precautions. Answered all pt questions. Recommend dc home once medically stable per physician. All acute OT needs met and will sign off. Thank you.     Follow Up Recommendations  No OT follow up;Supervision - Intermittent    Equipment Recommendations  None recommended by OT    Recommendations for Other Services       Precautions / Restrictions Precautions Precautions: Fall;Back Precaution Booklet Issued: No Restrictions Weight Bearing Restrictions: No      Mobility Bed Mobility Overal bed mobility: Needs Assistance Bed Mobility: Rolling;Sidelying to Sit Rolling: Supervision Sidelying to sit: Supervision       General bed mobility comments: In chair upon arrival  Transfers Overall transfer level: Needs assistance Equipment used: Straight cane Transfers: Sit to/from Stand Sit to Stand: Min guard         General transfer comment: Min guard for safety. No physical assist required.     Balance Overall balance assessment: Needs assistance Sitting-balance support: No upper extremity supported;Feet supported Sitting balance-Leahy Scale: Good     Standing balance support: Single extremity supported;During functional  activity Standing balance-Leahy Scale: Fair Standing balance comment: Static standing without UE support                            ADL either performed or assessed with clinical judgement   ADL Overall ADL's : Needs assistance/impaired                                       General ADL Comments: Pt performing grooming, toileting, and functional mobility with supervision. Pt presenting near baseline function. Pt able to verbalize safe LB ADL techniques to adhere to back precautions.      Vision         Perception     Praxis      Pertinent Vitals/Pain Pain Assessment: No/denies pain     Hand Dominance Right   Extremity/Trunk Assessment Upper Extremity Assessment Upper Extremity Assessment: Overall WFL for tasks assessed   Lower Extremity Assessment Lower Extremity Assessment: Generalized weakness   Cervical / Trunk Assessment Cervical / Trunk Assessment: Other exceptions Cervical / Trunk Exceptions: recent back surgery    Communication Communication Communication: No difficulties   Cognition Arousal/Alertness: Awake/alert Behavior During Therapy: WFL for tasks assessed/performed Overall Cognitive Status: Within Functional Limits for tasks assessed                                 General Comments: Pt requiring Min cues from OT or husband for problem solving.    General Comments  Husband present throughout session    Exercises     Shoulder Instructions  Home Living Family/patient expects to be discharged to:: Private residence Living Arrangements: Spouse/significant other Available Help at Discharge: Family;Available 24 hours/day Type of Home: House Home Access: Stairs to enter [employer] of Steps: 2-3 Entrance Stairs-Rails: Right;Left Home Layout: Two level;Able to live on main level with bedroom/bathroom     Bathroom Shower/Tub: Walk-in shower;Tub/shower unit   Bathroom Toilet: Handicapped  height     Home Equipment: Cane - single point;Shower seat          Prior Functioning/Environment Level of Independence: Independent with assistive device(s)        Comments: Using cane for ambulation         OT Problem List: Decreased activity tolerance;Impaired balance (sitting and/or standing);Decreased strength      OT Treatment/Interventions:      OT Goals(Current goals can be found in the care plan section) Acute Rehab OT Goals Patient Stated Goal: to go home  OT Goal Formulation: All assessment and education complete, DC therapy  OT Frequency:     Barriers to D/C:            Co-evaluation              AM-PAC PT "6 Clicks" Daily Activity     Outcome Measure Help from another person eating meals?: None Help from another person taking care of personal grooming?: None Help from another person toileting, which includes using toliet, bedpan, or urinal?: A Little Help from another person bathing (including washing, rinsing, drying)?: A Little Help from another person to put on and taking off regular upper body clothing?: A Little Help from another person to put on and taking off regular lower body clothing?: A Little 6 Click Score: 20   End of Session Equipment Utilized During Treatment: Other (comment)(SPC) Nurse Communication: Mobility status  Activity Tolerance: Patient tolerated treatment well Patient left: in chair;with call bell/phone within reach  OT Visit Diagnosis: Unsteadiness on feet (R26.81);Other abnormalities of gait and mobility (R26.89);Muscle weakness (generalized) (M62.81)                Time: 9847-3085 OT Time Calculation (min): 20 min Charges:  OT General Charges $OT Visit: 1 Visit OT Evaluation $OT Eval Low Complexity: 1 Low G-Codes:     Olivette Beckmann MSOT, OTR/L Acute Rehab Pager: [phone] Office: [facility] 01/25/2018, 1:04 PM

## 2018-01-25 NOTE — Consult Note (Addendum)
Cardiology Consultation     Patient ID: [name], [name] [id number], November 17, 1935 Admit date: 01/24/2018 Date of Consult: 01/25/2018  Primary Physician: Garret Reddish Primary Cardiologist:  Referring Physician: Eliseo Squires. J U  Chief Complaint: Chest pain radiating to right jaw. Reason for Consultation: Chest pain, to rule out ACS.  HPI: This is a 82 y.o. female with a past medical history significant for hypertension, hyperlipidemia, chronic back pain status post lumbar discectomy, anxiety, GERD/ hiatal hernia, history of lichen simplex chronicus, macular degeneration, no prior history of heart disease, presenting with substernal pressure-like chest pain beginning about 30 minutes prior to presentation, while at church, radiating to the right jaw lasting for a total of 1 h.  Pain relieved with aspirin, no nitroglycerin given. She denies any associated nausea, vomiting, palpitations, diaphoresis, dizziness, shortness of breath, orthopnea or PND. Patient feels very stressed recently as her daughter is moving to [place] and she will be away from her and the grandkids who lives very close by now.  She lives with her husband and is very close to her daughter.  EKG was within normal limit, trending troponin remains negative.  Patient did not had any more episode.  Rest of the labs are within normal limits.  A1c 5.2.  Lipid profile within normal limit and LDL of 78, patient takes Vytorin at home. Echo with normal ejection fraction and no wall motion abnormalities, had grade 1 diastolic dysfunction.   PMHx:  CARDIAC HISTORY: Echo:  01/24/18:  Study Conclusions  - Left ventricle: The cavity size was normal. There was mild   concentric hypertrophy. Systolic function was normal. The   estimated ejection fraction was in the range of 60% to 65%. Wall   motion was normal; there were no regional wall motion   abnormalities. Doppler parameters are consistent with abnormal   left ventricular relaxation  (grade 1 diastolic dysfunction).   Doppler parameters are consistent with indeterminate ventricular   filling pressure. - Aortic valve: Transvalvular velocity was within the normal range.   There was no stenosis. There was trivial regurgitation. - Mitral valve: Transvalvular velocity was within the normal range.   There was no evidence for stenosis. There was trivial   regurgitation. - Right ventricle: The cavity size was normal. Wall thickness was   normal. Systolic function was normal. - Tricuspid valve: There was mild regurgitation. - Pulmonary arteries: Systolic pressure was within the normal   range. PA peak pressure: 31 mm Hg (S).  Past Medical History:  Diagnosis Date  . Anxiety   . Colon polyps   . Complication of anesthesia    takes longer to wake up- admitted overnight after colonoscopy  . Depression   . GERD (gastroesophageal reflux disease)   . Glaucoma   . Headache    migraines  . Hx: UTI (urinary tract infection)   . Hypercholesteremia   . Hypertension   . Lichen simplex chronicus   . Lumbar stenosis with neurogenic claudication   . Macular degeneration   . Osteoarthritis   . Osteopenia    of the hip  . PONV (postoperative nausea and vomiting)   . Stress incontinence, female   . Unsteady gait   . Urinary urgency   . Wears glasses   . Whooping cough    as a baby   Past Surgical History:  Procedure Laterality Date  . ABDOMINAL HYSTERECTOMY     ovaries remain  . ANTERIOR LAT LUMBAR FUSION N/A 10/26/2017   Procedure: LUMBAR THREE- LUMBAR FOUR, LUMBAR FOUR-  LUMBAR FIVE ANTEROLATERAL LUMBAR INTERBODY ARTHRODESIS;  Surgeon: Jovita Gamma, MD;  Location: [facility];  Service: Neurosurgery;  Laterality: N/A;  LUMBAR 3- LUMBAR 4, LUMBAR 4- LUMBAR 5 ANTEROLATERAL LUMBAR INTERBODY ARTHRODESIS, LUMBAR 3- LUMBAR 4, LUMBAR 4- LUMBAR 5 PERCUTANEOUS PEDICLE SCREW FIXATION  . APPENDECTOMY    . BLADDER REPAIR     x two  . CHOLECYSTECTOMY    . COLONOSCOPY    . EYE SURGERY   11/14, 12/14   Macular Dengeneration, Glaucoma  . EYE SURGERY  2016   left eye  . LESION REMOVAL Right 06/28/2013   Procedure: EXCISION 3 cm right labial sebaceous cyst;  Surgeon: Lyman Speller, MD;  Location: [facility];  Service: Gynecology;  Laterality: Right;  . LUMBAR LAMINECTOMY/DECOMPRESSION MICRODISCECTOMY Right 03/20/2016   Procedure: Right - Lumbar four-five lumbar laminotomy, foraminotomy, and possible microdiscectomy;  Surgeon: Jovita Gamma, MD;  Location: [facility];  Service: Neurosurgery;  Laterality: Right;  right  . LUMBAR PERCUTANEOUS PEDICLE SCREW 2 LEVEL  10/26/2017   Procedure: LUMBAR THREE- LUMBAR FOUR, LUMBAR FOUR- LUMBAR FIVE PERCUTANEOUS PEDICLE SCREW FIXATION;  Surgeon: Jovita Gamma, MD;  Location: [facility];  Service: Neurosurgery;;  . MOHS SURGERY     procedure to remove basal cell  . TONSILLECTOMY AND ADENOIDECTOMY    . URETHRAL SLING  2007  . URETHRAL SLING  1/12   midurethral   . vertebroplasty secondary to traumatic compression fracture      FAMHx: Family History  Problem Relation Age of Onset  . Congestive Heart Failure Mother   . Thyroid disease Mother   . Osteoporosis Mother   . Alcoholism Mother   . Prostate cancer Father   . Breast cancer Maternal Grandmother   . Hypertension Sister   . Thyroid disease Sister   . Cancer Sister        brain ?  Marland Kitchen Rheum arthritis Daughter     SOCHx:  reports that she quit smoking about 52 years ago. She has never used smokeless tobacco. She reports that she drinks about 0.6 oz of alcohol per week. She reports that she does not use drugs.  ALLERGIES: Allergies  Allergen Reactions  . Citrate Of Magnesia Other (See Comments)    confusion  . Codeine Nausea And Vomiting  . Shingrix [Zoster Vac Recomb Adjuvanted] Swelling and Rash    Rash and temp x several days. (low grade 100 -101 )      HOME MEDICATIONS: Medications Prior to Admission  Medication Sig Dispense Refill Last Dose  . acetaminophen  (TYLENOL) 500 MG tablet Take 500 mg by mouth daily as needed for mild pain or headache.    Past Month at Unknown time  . ALPRAZolam (XANAX) 0.5 MG tablet Take 0.5-1 tablets (0.25-0.5 mg total) by mouth 2 (two) times daily as needed. for anxiety 20 tablet 0 Past Month at Unknown time  . brimonidine-timolol (COMBIGAN) 0.2-0.5 % ophthalmic solution Place 1 drop into both eyes every 12 (twelve) hours.   01/24/2018 at Unknown time  . ezetimibe-simvastatin (VYTORIN) 10-20 MG tablet Take 1 tablet by mouth daily. 90 tablet 3 01/23/2018 at Unknown time  . ibuprofen (ADVIL,MOTRIN) 200 MG tablet Take 200 mg by mouth every 8 (eight) hours as needed.   unk at prn  . ketoconazole (NIZORAL) 2 % cream Apply 1 application topically daily. For circular area on left lower leg 30 g 0 01/23/2018 at Unknown time  . nadolol (CORGARD) 40 MG tablet Take 1 tablet (40 mg total) by mouth at  bedtime. 90 tablet 1 01/23/2018 at Unknown time  . nitrofurantoin, macrocrystal-monohydrate, (MACROBID) 100 MG capsule Take 1 capsule (100 mg total) by mouth 2 (two) times daily. 10 capsule 0 01/24/2018 at Unknown time  . omeprazole (PRILOSEC) 20 MG capsule Take 20 mg by mouth daily before breakfast.   01/24/2018 at Unknown time  . sodium chloride (OCEAN) 0.65 % SOLN nasal spray Place 1 spray into both nostrils 2 (two) times daily as needed for congestion.   unk at prn  . traZODone (DESYREL) 100 MG tablet TAKE 1/4 TO 1/2 TABLET BY MOUTH AT BEDTIME IF NEEDED FOR SLEEP 15 tablet 1 01/23/2018 at Unknown time  . triamcinolone ointment (KENALOG) 0.5 % Apply 1 application topically 2 (two) times daily. 30 g 0 01/23/2018 at Unknown time  . venlafaxine XR (EFFEXOR-XR) 150 MG 24 hr capsule TAKE 1 CAPSULE BY MOUTH TWICE A DAY 60 capsule 5 01/24/2018 at Unknown time  . clobetasol ointment (TEMOVATE) 0.05 % Apply topically 2 (two) times daily. Do not use for more than 7 days in a row. (Patient not taking: Reported on 01/24/2018) 30 g 1 Not Taking at Unknown time  .  sulfamethoxazole-trimethoprim (BACTRIM DS,SEPTRA DS) 800-160 MG tablet Take 1 tablet by mouth 2 (two) times daily. (Patient not taking: Reported on 01/24/2018) 10 tablet 0 Not Taking at Unknown time    HOSPITAL MEDICATIONS: . brimonidine  1 drop Both Eyes Q12H   And  . timolol  1 drop Both Eyes Q12H  . enoxaparin (LOVENOX) injection  40 mg Subcutaneous Q24H  . ezetimibe-simvastatin  1 tablet Oral Daily  . ketoconazole  1 application Topical Daily  . nadolol  40 mg Oral QHS  . pantoprazole  40 mg Oral Daily  . triamcinolone ointment  1 application Topical BID  . venlafaxine XR  150 mg Oral BID    ROS General: Negative; No fevers, chills, or night sweats;  HEENT: Negative; No changes in vision or hearing, sinus congestion, difficulty swallowing Pulmonary: Negative; No cough, wheezing, shortness of breath, hemoptysis Cardiovascular: Negative; No chest pain, presyncope, syncope, palpitations GI: Negative; No nausea, vomiting, diarrhea, or abdominal pain GU: Negative; No dysuria, hematuria, or difficulty voiding Musculoskeletal: Lower back pain and left leg pain. Hematologic/Oncology: Negative; no easy bruising, bleeding Endocrine: Negative; no heat/cold intolerance; no diabetes Neuro: Negative; no changes in balance, headaches Skin: Negative; No rashes or skin lesions Psychiatric: Negative; No behavioral problems, depression Other comprehensive 14 point system review is negative.  VITALS: Blood pressure (!) 146/72, pulse 68, temperature 97.6 F (36.4 C), temperature source Oral, resp. rate 18, height 5' (1.524 m), weight 160 lb 14.4 oz (73 kg), last menstrual period 08/11/1974, SpO2 98 %.  PHYSICAL EXAM: Vitals:   01/24/18 2033 01/25/18 0009 01/25/18 0423 01/25/18 0745  BP: 125/69 105/64 (!) 117/57 (!) 146/72  Pulse: 72 67 67 68  Resp: 18 16 18    Temp: (!) 97.4 F (36.3 C) 97.7 F (36.5 C) 97.7 F (36.5 C) 97.6 F (36.4 C)  TempSrc: Oral Oral Oral Oral  SpO2: 96% 96% 98% 98%   Weight:   160 lb 14.4 oz (73 kg)   Height:       General: Vital signs reviewed.  Patient is well-developed and well-nourished, in no acute distress and cooperative with exam.  Head: Normocephalic and atraumatic. Eyes: EOMI, conjunctivae normal, no scleral icterus.  Neck: Supple, trachea midline, normal ROM, no JVD, masses, thyromegaly, or carotid bruit present.  Cardiovascular: RRR, S1 normal, S2 normal, no murmurs, gallops,  or rubs. Pulmonary/Chest: Clear to auscultation bilaterally, no wheezes, rales, or rhonchi. Abdominal: Soft, non-tender, non-distended, BS +, no masses, organomegaly, or guarding present.  Extremities: No lower extremity edema bilaterally,  pulses symmetric and intact bilaterally. No cyanosis or clubbing. Neurological: A&O x3, Strength is normal and symmetric bilaterally, cranial nerve II-XII are grossly intact, no focal motor deficit, sensory intact to light touch bilaterally.  Skin: Warm, dry and intact. No rashes or erythema. Psychiatric: Normal mood and affect. speech and behavior is normal. Cognition and memory are normal.  ECG (independently read by me): Normal sinus rhythm.  LABS: Results for orders placed or performed during the hospital encounter of 01/24/18 (from the past 48 hour(s))  Basic metabolic panel     Status: Abnormal   Collection Time: 01/24/18 12:15 PM  Result Value Ref Range   Sodium 140 135 - 145 mmol/L   Potassium 4.3 3.5 - 5.1 mmol/L   Chloride 107 101 - 111 mmol/L   CO2 25 22 - 32 mmol/L   Glucose, Bld 106 (H) 65 - 99 mg/dL   BUN 13 6 - 20 mg/dL   Creatinine, Ser 0.76 0.44 - 1.00 mg/dL   Calcium 9.4 8.9 - 10.3 mg/dL   GFR calc non Af Amer >60 >60 mL/min   GFR calc Af Amer >60 >60 mL/min    Comment: (NOTE) The eGFR has been calculated using the CKD EPI equation. This calculation has not been validated in all clinical situations. eGFR's persistently <60 mL/min signify possible Chronic Kidney Disease.    Anion gap 8 5 - 15     Comment: Performed at [facility] [address], [place] [address]  CBC     Status: None   Collection Time: 01/24/18 12:15 PM  Result Value Ref Range   WBC 6.7 4.0 - 10.5 K/uL   RBC 4.49 3.87 - 5.11 MIL/uL   Hemoglobin 14.0 12.0 - 15.0 g/dL   HCT 43.8 36.0 - 46.0 %   MCV 97.6 78.0 - 100.0 fL   MCH 31.2 26.0 - 34.0 pg   MCHC 32.0 30.0 - 36.0 g/dL   RDW 12.5 11.5 - 15.5 %   Platelets 239 150 - 400 K/uL    Comment: Performed at [facility] [address] [place], [place] [address]  I-stat troponin, ED     Status: None   Collection Time: 01/24/18 12:21 PM  Result Value Ref Range   Troponin i, poc 0.00 0.00 - 0.08 ng/mL   Comment 3            Comment: Due to the release kinetics of cTnI, a negative result within the first hours of the onset of symptoms does not rule out myocardial infarction with certainty. If myocardial infarction is still suspected, repeat the test at appropriate intervals.   Troponin I-serum (0, 3, 6 hours)     Status: None   Collection Time: 01/24/18  2:59 PM  Result Value Ref Range   Troponin I <0.03 <0.03 ng/mL    Comment: Performed at [facility], [place] [address], [place], [place] [address]  Troponin I-serum (0, 3, 6 hours)     Status: None   Collection Time: 01/24/18  4:56 PM  Result Value Ref Range   Troponin I <0.03 <0.03 ng/mL    Comment: Performed at [facility] [address] [place], [place] [address]  Troponin I-serum (0, 3, 6 hours)     Status: None  Collection Time: 01/24/18  7:09 PM  Result Value Ref Range   Troponin I <0.03 <0.03 ng/mL    Comment: Performed at [facility], [address], [place], [place] [address]  Hemoglobin A1c     Status: None   Collection Time: 01/25/18  4:32 AM  Result Value Ref Range   Hgb A1c MFr Bld 5.2 4.8 - 5.6 %    Comment: (NOTE) Pre diabetes:          5.7%-6.4% Diabetes:              >6.4% Glycemic control for   <7.0% adults with diabetes    Mean Plasma  Glucose 102.54 mg/dL    Comment: Performed at [facility] [address] [place], [place] [address]  Lipid panel     Status: None   Collection Time: 01/25/18  4:32 AM  Result Value Ref Range   Cholesterol 144 0 - 200 mg/dL   Triglycerides 125 <150 mg/dL   HDL 41 >40 mg/dL   Total CHOL/HDL Ratio 3.5 RATIO   VLDL 25 0 - 40 mg/dL   LDL Cholesterol 78 0 - 99 mg/dL    Comment:        Total Cholesterol/HDL:CHD Risk Coronary Heart Disease Risk Table                     Men   Women  1/2 Average Risk   3.4   3.3  Average Risk       5.0   4.4  2 X Average Risk   9.6   7.1  3 X Average Risk  23.4   11.0        Use the calculated Patient Ratio above and the CHD Risk Table to determine the patient's CHD Risk.        ATP III CLASSIFICATION (LDL):  <100     mg/dL   Optimal  100-129  mg/dL   Near or Above                    Optimal  130-159  mg/dL   Borderline  160-189  mg/dL   High  >190     mg/dL   Very High Performed at [facility] [address], [place], [place] [address]     IMAGING: Dg Chest 2 View  Result Date: 01/24/2018 CLINICAL DATA:  Onset of weakness today. Right chest and mandible pain. EXAM: CHEST - 2 VIEW COMPARISON:  PA and lateral chest 10/30/2017. FINDINGS: Asymmetric elevation of the right hemidiaphragm relative to the left is unchanged. Lungs are clear. Heart size is normal. No pneumothorax or pleural effusion. No acute bony abnormality. The patient is status post L1 vertebral augmentation. IMPRESSION: No acute disease. Electronically Signed   By: Inge Rise M.D.   On: 01/24/2018 12:28    IMPRESSION: Patient has an isolated one episode of pressure-like chest pain radiating to right jaw, no left-sided radiation and no associated symptoms.  Pain resolved with full dose aspirin.  Nitroglycerin was not given.  EKG and trending troponin negative.  Echo with normal ejection fraction, mild eccentric hypertrophy, no wall motion abnormalities and grade 1  diastolic dysfunction.  RECOMMENDATION: Her heart score is 3 because of her age and mild hypertension, which is low risk. -She can be discharged home today with an outpatient chemical stress testing.  We will arrange for that appointment. -Low-dose amlodipine can be added for better control of  blood pressure and will help with any coronary spasms.   Lorella Nimrod MD PGY2 01/25/2018 10:03 AM   I have personally seen and examined this patient. I agree with the assessment and plan as outlined above.  She is a pleasant 82 year old female with h/o HTN and anxiety who is admitted after one episode of chest pain while at rest. No objective evidence of ischemia. EKG with sinus rhythm, no ischemic changes. Troponin negative. No recurrence of chest pain. My exam shows:   General: Well developed, well nourished, NAD  HEENT: OP clear, mucus membranes moist  SKIN: warm, dry. No rashes. Neuro: No focal deficits  Musculoskeletal: Muscle strength 5/5 all ext  Psychiatric: Mood and affect normal  Neck: No JVD, no carotid bruits, no thyromegaly, no lymphadenopathy.  Lungs:Clear bilaterally, no wheezes, rhonci, crackles Cardiovascular: Regular rate and rhythm. No murmurs, gallops or rubs. Abdomen:Soft. Bowel sounds present. Non-tender.  Extremities: No lower extremity edema. Pulses are 2 + in the bilateral DP/PT.  Labs reviewed by me.  EKG reviewed by me.   Plan: Chest pain without evidence of ACS. Her only risk factors for CAD are HTN which has been well controlled and her age. I think an outpatient stress test would be appropriate. She agrees to this plan. OK to discharge home today. We will plan an outpatient Lexiscan nuclear stress test. She wishes to follow up with Dr. Burt Knack since her husband is his patient. Our office will contact her for the stress test. She cannot walk on a treadmill due to back pain and recent back surgery.   Lauree Chandler 01/25/2018 11:24 AM

## 2018-01-25 NOTE — Evaluation (Signed)
Physical Therapy Evaluation Patient Details Name: [name] MRN: [id number] DOB: 10-02-1935 Today's Date: 01/25/2018   History of Present Illness   82 y.o. female with medical history significant fo hypertension, hyperlipidemia, chronic back pain status post lumbar discectomy, anxiety, GERD/ hiatal hernia, history of lichen simplex chronicus, macular degeneration, no prior history of heart disease, presenting with substernal chest pain  Clinical Impression  Pt admitted secondary to problem above with deficits below. Pt tolerated gait training and stair training well and required min guard A with use of cane. Pt reports she was going to outpatient PT following recent back surgery, so recommend continuation of outpatient PT. Reports husband available to assist as needed. Gabrielle continue to follow acutely to maximize functional mobility independence and safety.     Follow Up Recommendations Outpatient PT;Supervision for mobility/OOB(continuation of outpatient PT )    Equipment Recommendations  None recommended by PT    Recommendations for Other Services       Precautions / Restrictions Precautions Precautions: Fall;Back Precaution Booklet Issued: No Restrictions Weight Bearing Restrictions: No      Mobility  Bed Mobility Overal bed mobility: Needs Assistance Bed Mobility: Rolling;Sidelying to Sit Rolling: Supervision Sidelying to sit: Supervision       General bed mobility comments: Supervision for safety. Demonstrated good log roll technique.   Transfers Overall transfer level: Needs assistance Equipment used: Straight cane Transfers: Sit to/from Stand Sit to Stand: Min guard         General transfer comment: Min guard for safety. No physical assist required.   Ambulation/Gait Ambulation/Gait assistance: Min guard Gait Distance (Feet): 150 Feet Assistive device: Straight cane Gait Pattern/deviations: Step-through pattern;Decreased stride length Gait velocity:  Decreased    General Gait Details: Slow, cautious gait. Good sequencing using cane. Mild unsteadiness noted, however, no LOB noted.  Stairs Stairs: Yes Stairs assistance: Min guard Stair Management: One rail Right;With cane;Step to pattern;Forwards Number of Stairs: 3 General stair comments: Slow, cautious stair navigation. Verbal cues for sequencing using cane for stair management.   Wheelchair Mobility    Modified Rankin (Stroke Patients Only)       Balance Overall balance assessment: Needs assistance Sitting-balance support: No upper extremity supported;Feet supported Sitting balance-Leahy Scale: Good     Standing balance support: Single extremity supported;During functional activity Standing balance-Leahy Scale: Fair Standing balance comment: Static standing without UE support                              Pertinent Vitals/Pain Pain Assessment: No/denies pain    Home Living Family/patient expects to be discharged to:: Private residence Living Arrangements: Spouse/significant other Available Help at Discharge: Family;Available 24 hours/day Type of Home: House Home Access: Stairs to enter Entrance Stairs-Rails: Psychiatric nurse of Steps: 2-3 Home Layout: Two level;Able to live on main level with bedroom/bathroom Home Equipment: Kasandra Knudsen - single point;Shower seat      Prior Function Level of Independence: Independent with assistive device(s)         Comments: Using cane for ambulation      Hand Dominance   Dominant Hand: Right    Extremity/Trunk Assessment   Upper Extremity Assessment Upper Extremity Assessment: Defer to OT evaluation    Lower Extremity Assessment Lower Extremity Assessment: Generalized weakness    Cervical / Trunk Assessment Cervical / Trunk Assessment: Other exceptions Cervical / Trunk Exceptions: recent back surgery   Communication   Communication: No difficulties  Cognition Arousal/Alertness:  Awake/alert Behavior During Therapy: WFL for tasks assessed/performed Overall Cognitive Status: Within Functional Limits for tasks assessed                                        General Comments General comments (skin integrity, edema, etc.): Pt's husband present during session.     Exercises     Assessment/Plan    PT Assessment Patient needs continued PT services  PT Problem List Decreased strength;Decreased balance;Decreased mobility;Decreased knowledge of precautions       PT Treatment Interventions DME instruction;Gait training;Stair training;Functional mobility training;Therapeutic activities;Therapeutic exercise;Balance training;Patient/family education    PT Goals (Current goals can be found in the Care Plan section)  Acute Rehab PT Goals Patient Stated Goal: to go home  PT Goal Formulation: With patient Time For Goal Achievement: 02/08/18 Potential to Achieve Goals: Good    Frequency Min 3X/week   Barriers to discharge        Co-evaluation               AM-PAC PT "6 Clicks" Daily Activity  Outcome Measure Difficulty turning over in bed (including adjusting bedclothes, sheets and blankets)?: None Difficulty moving from lying on back to sitting on the side of the bed? : A Little Difficulty sitting down on and standing up from a chair with arms (e.g., wheelchair, bedside commode, etc,.)?: Unable Help needed moving to and from a bed to chair (including a wheelchair)?: A Little Help needed walking in hospital room?: A Little Help needed climbing 3-5 steps with a railing? : A Little 6 Click Score: 17    End of Session Equipment Utilized During Treatment: Gait belt Activity Tolerance: Patient tolerated treatment well Patient left: in chair;with call bell/phone within reach;with family/visitor present Nurse Communication: Mobility status PT Visit Diagnosis: Other abnormalities of gait and mobility (R26.89)    Time: 1130-1158 PT Time  Calculation (min) (ACUTE ONLY): 28 min   Charges:   PT Evaluation $PT Eval Low Complexity: 1 Low PT Treatments $Gait Training: 8-22 mins   PT G Codes:        Leighton Ruff, PT, DPT  Acute Rehabilitation Services  Pager: [phone]   Rudean Hitt 01/25/2018, 12:04 PM

## 2018-01-27 ENCOUNTER — Ambulatory Visit: Payer: Medicare Other | Admitting: Physical Therapy

## 2018-01-27 ENCOUNTER — Telehealth: Payer: Self-pay

## 2018-01-27 ENCOUNTER — Encounter: Payer: Self-pay | Admitting: Physical Therapy

## 2018-01-27 DIAGNOSIS — M545 Low back pain: Secondary | ICD-10-CM | POA: Diagnosis not present

## 2018-01-27 DIAGNOSIS — Z9181 History of falling: Secondary | ICD-10-CM

## 2018-01-27 DIAGNOSIS — R2681 Unsteadiness on feet: Secondary | ICD-10-CM

## 2018-01-27 DIAGNOSIS — M6281 Muscle weakness (generalized): Secondary | ICD-10-CM

## 2018-01-27 DIAGNOSIS — G8929 Other chronic pain: Secondary | ICD-10-CM

## 2018-01-27 NOTE — Therapy (Signed)
[facility] [address] [place], [place], [address] Phone: [phone]   Fax:  [phone]  Physical Therapy Treatment  Patient Details  Name: [name] MRN: [id number] Date of Birth: [birth year] Referring Provider: Jovita Gamma, MD   Encounter Date: 01/27/2018  PT End of Session - 01/27/18 1203    Visit Number  10    Number of Visits  16    Date for PT Re-Evaluation  02/01/18    Authorization Type  [employer] Medicare    PT Start Time  1151    PT Stop Time  1230    PT Time Calculation (min)  39 min    Equipment Utilized During Treatment  Back brace    Activity Tolerance  Patient tolerated treatment well;No increased pain    Behavior During Therapy  WFL for tasks assessed/performed       Past Medical History:  Diagnosis Date  . Anxiety   . Colon polyps   . Complication of anesthesia    takes longer to wake up- admitted overnight after colonoscopy  . Depression   . GERD (gastroesophageal reflux disease)   . Glaucoma   . Headache    migraines  . Hx: UTI (urinary tract infection)   . Hypercholesteremia   . Hypertension   . Lichen simplex chronicus   . Lumbar stenosis with neurogenic claudication   . Macular degeneration   . Osteoarthritis   . Osteopenia    of the hip  . PONV (postoperative nausea and vomiting)   . Stress incontinence, female   . Unsteady gait   . Urinary urgency   . Wears glasses   . Whooping cough    as a baby    Past Surgical History:  Procedure Laterality Date  . ABDOMINAL HYSTERECTOMY     ovaries remain  . ANTERIOR LAT LUMBAR FUSION N/A 10/26/2017   Procedure: LUMBAR THREE- LUMBAR FOUR, LUMBAR FOUR- LUMBAR FIVE ANTEROLATERAL LUMBAR INTERBODY ARTHRODESIS;  Surgeon: Jovita Gamma, MD;  Location: [facility];  Service: Neurosurgery;  Laterality: N/A;  LUMBAR 3- LUMBAR 4, LUMBAR 4- LUMBAR 5 ANTEROLATERAL LUMBAR INTERBODY ARTHRODESIS, LUMBAR 3- LUMBAR 4, LUMBAR 4- LUMBAR 5  PERCUTANEOUS PEDICLE SCREW FIXATION  . APPENDECTOMY    . BLADDER REPAIR     x two  . CHOLECYSTECTOMY    . COLONOSCOPY    . EYE SURGERY  11/14, 12/14   Macular Dengeneration, Glaucoma  . EYE SURGERY  2016   left eye  . LESION REMOVAL Right 06/28/2013   Procedure: EXCISION 3 cm right labial sebaceous cyst;  Surgeon: Lyman Speller, MD;  Location: [facility];  Service: Gynecology;  Laterality: Right;  . LUMBAR LAMINECTOMY/DECOMPRESSION MICRODISCECTOMY Right 03/20/2016   Procedure: Right - Lumbar four-five lumbar laminotomy, foraminotomy, and possible microdiscectomy;  Surgeon: Jovita Gamma, MD;  Location: [facility];  Service: Neurosurgery;  Laterality: Right;  right  . LUMBAR PERCUTANEOUS PEDICLE SCREW 2 LEVEL  10/26/2017   Procedure: LUMBAR THREE- LUMBAR FOUR, LUMBAR FOUR- LUMBAR FIVE PERCUTANEOUS PEDICLE SCREW FIXATION;  Surgeon: Jovita Gamma, MD;  Location: [facility];  Service: Neurosurgery;;  . MOHS SURGERY     procedure to remove basal cell  . TONSILLECTOMY AND ADENOIDECTOMY    . URETHRAL SLING  2007  . URETHRAL SLING  1/12   midurethral   . vertebroplasty secondary to traumatic compression fracture      There were no vitals filed for this visit.  Subjective Assessment - 01/27/18 1153  Subjective  Pt feels she has been doing great. She is afraid to take off her back brace. She has no pain currently.     Currently in Pain?  No/denies         [facility] PT Assessment - 01/27/18 0001      Transfers   Five time sit to stand comments   9 sec, no UE support       Timed Up and Go Test   Normal TUG (seconds)  12 no AD                   [facility] Adult PT Treatment/Exercise - 01/27/18 0001      Lumbar Exercises: Aerobic   Nustep  L2 x10 min (PT present to discuss progress/goals)      Knee/Hip Exercises: Standing   Heel Raises  2 sets;15 reps alternating with squats    Hip Abduction  2 sets;10 reps;Both    Abduction Limitations  2#    Hip Extension  Both;Knee  straight;20 reps    Extension Limitations  2#    Functional Squat  2 sets;10 reps    Other Standing Knee Exercises  knee flexion 2x10 reps, 2# ankle weight       Knee/Hip Exercises: Seated   Long Arc Quad  Both;15 reps;Weights;1 set    Long Arc Quad Weight  3 lbs.    Marching  Strengthening;2 sets;15 reps;Both    Marching Limitations  2#             PT Education - 01/27/18 1229    Education provided  Yes    Education Details  progress made towards goals     Person(s) Educated  Patient    Methods  Explanation    Comprehension  Verbalized understanding       PT Short Term Goals - 01/27/18 1158      PT SHORT TERM GOAL #1   Title  verbalize understanding of posture/body mechanics to decrease risk of reinjury    Status  Achieved      PT SHORT TERM GOAL #2   Title  improve functional strength by demonstrating ability to stand without UE support at least 3/5 trials    Status  Achieved      PT SHORT TERM GOAL #3   Title  improve timed up and go to < 20 sec for improved functional mobility    Baseline  25.95 seconds    Period  Weeks    Status  Achieved        PT Long Term Goals - 01/27/18 1158      PT LONG TERM GOAL #1   Title  independent with HEP    Status  On-going      PT LONG TERM GOAL #2   Title  demonstrate improved functional strength by performing 5x STS in < 20 sec withou UE support    Baseline  9 sec    Status  Achieved      PT LONG TERM GOAL #3   Title  improve timed up and go to < 17 sec with LRAD for improved function    Status  New      PT LONG TERM GOAL #4   Title  amb > 250' with LRAD on various indoor/outdoor surfaces modified independently for improved function    Baseline  using SPC    Status  On-going      PT LONG TERM GOAL #5   Title  report pain <  4/10 with activity for improved functional mobility    Baseline  reports not having any pain with daily activity     Status  Achieved            Plan - 01/27/18 1204    Clinical  Impression Statement  Pt is making steady progress towards her goals, meeting all of her short term and several of her long term goals. She reports little to no pain throughout the day, and her performance on TUG (12 sec) and 5x sit to stand (9 sec) is improved to within normal limits for someone her age. She does still demonstrate moments of unsteadiness, requiring close supervision during her session. Focused on progressions of LE strengthening exercises, and pt was able to complete with minor adjustments to technique. Pt would continue to benefit from skilled PT services to address remaining limitations in balance, strength and endurance.     Rehab Potential  Good    PT Frequency  2x / week    PT Duration  6 weeks    PT Treatment/Interventions  ADLs/Self Care Home Management;Cryotherapy;Electrical Stimulation;Moist Heat;Traction;Therapeutic exercise;Therapeutic activities;Functional mobility training;Stair training;Gait training;DME Instruction;Ultrasound;Balance training;Neuromuscular re-education;Patient/family education;Manual techniques;Taping;Dry needling    PT Next Visit Plan  core/hip strengthening maintaining neutral spine, balance, work on sit to stand.      PT Home Exercise Plan  Access Code: [email or web address]    Consulted and Agree with Plan of Care  Patient       Patient will benefit from skilled therapeutic intervention in order to improve the following deficits and impairments:  Abnormal gait, Decreased balance, Decreased cognition, Decreased knowledge of use of DME, Decreased mobility, Decreased safety awareness, Impaired flexibility, Postural dysfunction, Improper body mechanics, Impaired perceived functional ability, Difficulty walking, Increased muscle spasms, Pain, Increased fascial restricitons, Decreased strength, Decreased endurance  Visit Diagnosis: Chronic right-sided low back pain without sciatica  Muscle weakness (generalized)  History of falling  Unsteadiness on  feet     Problem List Patient Active Problem List   Diagnosis Date Noted  . Chest pain 01/24/2018  . Liver lesion 11/10/2017  . Nausea & vomiting 10/31/2017  . Chronic daily headache 07/08/2017  . Memory changes 03/06/2017  . Lumbar stenosis with neurogenic claudication 03/20/2016  . Scalp pain 10/22/2015  . Post-traumatic headache 10/11/2015  . Routine health maintenance 03/06/2013  . Macular degeneration of right eye 03/03/2013  . Glaucoma 03/03/2013  . Hyperlipidemia 03/14/2009  . Depression 10/19/2007  . Essential hypertension 10/19/2007  . GASTROESOPHAGEAL REFLUX DISEASE 10/19/2007  . Osteoarthritis 10/19/2007  . STRESS INCONTINENCE 10/19/2007  . Allergic state 10/19/2007    12:36 PM,01/27/18 Sherol Dade PT, DPT [facility] [address], [place], [place], [address] Phone: [phone]   Fax:  [phone]  Name: [name] MRN: [id number] Date of Birth: 06/07/1936

## 2018-01-27 NOTE — Telephone Encounter (Signed)
LM requesting call back to complete TCM and schedule hospital follow up.   

## 2018-01-28 NOTE — Telephone Encounter (Signed)
LM requesting call back to complete TCM and schedule hospital follow up.   

## 2018-01-29 ENCOUNTER — Ambulatory Visit: Payer: Medicare Other | Admitting: Obstetrics & Gynecology

## 2018-01-29 ENCOUNTER — Encounter: Payer: Self-pay | Admitting: Obstetrics & Gynecology

## 2018-01-29 VITALS — BP 134/70 | HR 76 | Resp 16 | Ht 60.0 in | Wt 161.0 lb

## 2018-01-29 DIAGNOSIS — N3090 Cystitis, unspecified without hematuria: Secondary | ICD-10-CM | POA: Diagnosis not present

## 2018-01-29 DIAGNOSIS — L292 Pruritus vulvae: Secondary | ICD-10-CM | POA: Diagnosis not present

## 2018-01-29 NOTE — Progress Notes (Signed)
GYNECOLOGY  VISIT  CC:   Recheck  HPI: 82 y.o. G4P2 Married Caucasian female here for follow up UTI.  This is her second UTI since her back surgery.  Has incontinence.  Wears a depends-type undergarment.  Feels symptoms of dysuria are completely resolved.  Reviewed last two positive urine cultures with pt-- 01/12/18 enterococcus and 4/12 enterobacter.  D/w pt treatment with suppressive therapy for 3-6 months if has another UTI.  Pt comfortable with this plan.    She is having intense itching on left labia majora on the left side.  Taking toilet paper and just rubbing and rubbing this area.  Denies vaginal or vulvar bleeding.  GYNECOLOGIC HISTORY: Patient's last menstrual period was 08/11/1974. Contraception: post menopausal  Menopausal hormone therapy: none   Patient Active Problem List   Diagnosis Date Noted  . Chest pain 01/24/2018  . Liver lesion 11/10/2017  . Nausea & vomiting 10/31/2017  . Chronic daily headache 07/08/2017  . Memory changes 03/06/2017  . Lumbar stenosis with neurogenic claudication 03/20/2016  . Scalp pain 10/22/2015  . Post-traumatic headache 10/11/2015  . Routine health maintenance 03/06/2013  . Macular degeneration of right eye 03/03/2013  . Glaucoma 03/03/2013  . Hyperlipidemia 03/14/2009  . Depression 10/19/2007  . Essential hypertension 10/19/2007  . GASTROESOPHAGEAL REFLUX DISEASE 10/19/2007  . Osteoarthritis 10/19/2007  . STRESS INCONTINENCE 10/19/2007  . Allergic state 10/19/2007    Past Medical History:  Diagnosis Date  . Anxiety   . Colon polyps   . Complication of anesthesia    takes longer to wake up- admitted overnight after colonoscopy  . Depression   . GERD (gastroesophageal reflux disease)   . Glaucoma   . Headache    migraines  . Hx: UTI (urinary tract infection)   . Hypercholesteremia   . Hypertension   . Lichen simplex chronicus   . Lumbar stenosis with neurogenic claudication   . Macular degeneration   . Osteoarthritis   .  Osteopenia    of the hip  . PONV (postoperative nausea and vomiting)   . Stress incontinence, female   . Unsteady gait   . Urinary urgency   . Wears glasses   . Whooping cough    as a baby    Past Surgical History:  Procedure Laterality Date  . ABDOMINAL HYSTERECTOMY     ovaries remain  . ANTERIOR LAT LUMBAR FUSION N/A 10/26/2017   Procedure: LUMBAR THREE- LUMBAR FOUR, LUMBAR FOUR- LUMBAR FIVE ANTEROLATERAL LUMBAR INTERBODY ARTHRODESIS;  Surgeon: Jovita Gamma, MD;  Location: [facility];  Service: Neurosurgery;  Laterality: N/A;  LUMBAR 3- LUMBAR 4, LUMBAR 4- LUMBAR 5 ANTEROLATERAL LUMBAR INTERBODY ARTHRODESIS, LUMBAR 3- LUMBAR 4, LUMBAR 4- LUMBAR 5 PERCUTANEOUS PEDICLE SCREW FIXATION  . APPENDECTOMY    . BLADDER REPAIR     x two  . CHOLECYSTECTOMY    . COLONOSCOPY    . EYE SURGERY  11/14, 12/14   Macular Dengeneration, Glaucoma  . EYE SURGERY  2016   left eye  . LESION REMOVAL Right 06/28/2013   Procedure: EXCISION 3 cm right labial sebaceous cyst;  Surgeon: Lyman Speller, MD;  Location: [facility];  Service: Gynecology;  Laterality: Right;  . LUMBAR LAMINECTOMY/DECOMPRESSION MICRODISCECTOMY Right 03/20/2016   Procedure: Right - Lumbar four-five lumbar laminotomy, foraminotomy, and possible microdiscectomy;  Surgeon: Jovita Gamma, MD;  Location: [facility];  Service: Neurosurgery;  Laterality: Right;  right  . LUMBAR PERCUTANEOUS PEDICLE SCREW 2 LEVEL  10/26/2017   Procedure: LUMBAR THREE- LUMBAR FOUR,  LUMBAR FOUR- LUMBAR FIVE PERCUTANEOUS PEDICLE SCREW FIXATION;  Surgeon: Jovita Gamma, MD;  Location: [facility];  Service: Neurosurgery;;  . MOHS SURGERY     procedure to remove basal cell  . TONSILLECTOMY AND ADENOIDECTOMY    . URETHRAL SLING  2007  . URETHRAL SLING  1/12   midurethral   . vertebroplasty secondary to traumatic compression fracture      MEDS:   Current Outpatient Medications on File Prior to Visit  Medication Sig Dispense Refill  . acetaminophen (TYLENOL)  500 MG tablet Take 500 mg by mouth daily as needed for mild pain or headache.     . ALPRAZolam (XANAX) 0.5 MG tablet Take 0.5-1 tablets (0.25-0.5 mg total) by mouth 2 (two) times daily as needed. for anxiety 20 tablet 0  . brimonidine-timolol (COMBIGAN) 0.2-0.5 % ophthalmic solution Place 1 drop into both eyes every 12 (twelve) hours.    Marland Kitchen ezetimibe-simvastatin (VYTORIN) 10-20 MG tablet Take 1 tablet by mouth daily. 90 tablet 3  . ibuprofen (ADVIL,MOTRIN) 200 MG tablet Take 200 mg by mouth every 8 (eight) hours as needed.    Marland Kitchen ketoconazole (NIZORAL) 2 % cream Apply 1 application topically daily. For circular area on left lower leg 30 g 0  . nadolol (CORGARD) 40 MG tablet Take 1 tablet (40 mg total) by mouth at bedtime. 90 tablet 1  . omeprazole (PRILOSEC) 20 MG capsule Take 20 mg by mouth daily before breakfast.    . sodium chloride (OCEAN) 0.65 % SOLN nasal spray Place 1 spray into both nostrils 2 (two) times daily as needed for congestion.    . traZODone (DESYREL) 100 MG tablet TAKE 1/4 TO 1/2 TABLET BY MOUTH AT BEDTIME IF NEEDED FOR SLEEP 15 tablet 1  . triamcinolone ointment (KENALOG) 0.5 % Apply 1 application topically 2 (two) times daily. 30 g 0  . venlafaxine XR (EFFEXOR-XR) 150 MG 24 hr capsule TAKE 1 CAPSULE BY MOUTH TWICE A DAY 60 capsule 5   No current facility-administered medications on file prior to visit.     ALLERGIES: Citrate of magnesia; Codeine; and Shingrix [zoster vac recomb adjuvanted]  Family History  Problem Relation Age of Onset  . Congestive Heart Failure Mother   . Thyroid disease Mother   . Osteoporosis Mother   . Alcoholism Mother   . Prostate cancer Father   . Breast cancer Maternal Grandmother   . Hypertension Sister   . Thyroid disease Sister   . Cancer Sister        brain ?  Marland Kitchen Rheum arthritis Daughter     SH:  Married, non smoker  Review of Systems  All other systems reviewed and are negative.   PHYSICAL EXAMINATION:    BP 134/70 (BP Location:  Right Arm, Patient Position: Sitting, Cuff Size: Normal)   Pulse 76   Resp 16   Ht 5' (1.524 m)   Wt 161 lb (73 kg)   LMP 08/11/1974   BMI 31.44 kg/m     Physical Exam  Constitutional: She is oriented to person, place, and time. She appears well-developed and well-nourished.  Genitourinary: Vagina normal.     Lymphadenopathy:       Right: No inguinal adenopathy present.       Left: No inguinal adenopathy present.  Neurological: She is alert and oriented to person, place, and time.  Skin: Skin is warm and dry.  Psychiatric: She has a normal mood and affect.   Assessment: UTI, symptoms improved Vulvar itching, chronic moisture due  to incontinence  Plan: Pt will start using Clobetasol ointment 0.05% BID for 2-3 weeks and call and give me an update.   Knows to call with any new urinary symptoms.     ~15 minutes spent with patient >50% of time was in face to face discussion of above.

## 2018-01-29 NOTE — Telephone Encounter (Signed)
LM requesting call back to complete TCM and schedule hospital follow up.   

## 2018-02-01 ENCOUNTER — Telehealth (HOSPITAL_COMMUNITY): Payer: Self-pay | Admitting: *Deleted

## 2018-02-01 NOTE — Telephone Encounter (Signed)
Left message on voicemail per DPR in reference to upcoming appointment scheduled on 02/05/18 at 1000 with detailed instructions given per Myocardial Perfusion Study Information Sheet for the test. LM to arrive 15 minutes early, and that it is imperative to arrive on time for appointment to keep from having the test rescheduled. If you need to cancel or reschedule your appointment, please call the office within 24 hours of your appointment. Failure to do so may result in a cancellation of your appointment, and a $50 no show fee. Phone number given for call back for any questions.

## 2018-02-01 NOTE — Telephone Encounter (Signed)
LM requesting call back to complete TCM and schedule hospital follow up.   

## 2018-02-02 ENCOUNTER — Ambulatory Visit: Payer: Medicare Other | Admitting: Physical Therapy

## 2018-02-02 DIAGNOSIS — M6281 Muscle weakness (generalized): Secondary | ICD-10-CM

## 2018-02-02 DIAGNOSIS — M545 Low back pain: Principal | ICD-10-CM

## 2018-02-02 DIAGNOSIS — G8929 Other chronic pain: Secondary | ICD-10-CM

## 2018-02-02 DIAGNOSIS — R2681 Unsteadiness on feet: Secondary | ICD-10-CM

## 2018-02-02 DIAGNOSIS — Z9181 History of falling: Secondary | ICD-10-CM

## 2018-02-02 NOTE — Patient Instructions (Signed)
Access Code: [email or web address]  URL: [email or web address]  Date: 02/02/2018  Prepared by: Elly Modena   Exercises  Hooklying Isometric Hip Flexion - 10 reps - 2x daily - 7x weekly  Bridge with Resistance - 15 reps - 1 sets - 2 sec hold - 2x daily - 7x weekly  Supine Hip Abduction on Slider - 15 reps - 2x daily - 7x weekly  Seated Hip Abduction - 10 reps - 2 sets - 1x daily - 7x weekly  Standing Hip Extension - 10 reps - 2 sets - 2x daily - 7x weekly  Standing Heel Raise with Support - 20 reps - 1 sets - 2x daily - 7x weekly    [facility] [address], [place], [place] [address] Phone # [phone] Fax [phone]

## 2018-02-02 NOTE — Therapy (Signed)
[facility] [address] [place], [place], [address] Phone: [phone]   Fax:  [phone]  Physical Therapy Treatment  Patient Details  Name: [name] MRN: [id number] Date of Birth: March 24, 1936 Referring Provider: Jovita Gamma, MD   Encounter Date: 02/02/2018  PT End of Session - 02/02/18 1131    Visit Number  11    Number of Visits  16    Date for PT Re-Evaluation  03/04/18    Authorization Type  [employer] Medicare (Progress note completed on 10th visit)    Authorization Time Period  02/02/18 to 03/04/18    Authorization - Visit Number  1    Authorization - Number of Visits  10    PT Start Time  1101    PT Stop Time  1145    PT Time Calculation (min)  44 min    Equipment Utilized During Treatment  Back brace    Activity Tolerance  Patient tolerated treatment well;No increased pain    Behavior During Therapy  WFL for tasks assessed/performed       Past Medical History:  Diagnosis Date  . Anxiety   . Colon polyps   . Complication of anesthesia    takes longer to wake up- admitted overnight after colonoscopy  . Depression   . GERD (gastroesophageal reflux disease)   . Glaucoma   . Headache    migraines  . Hx: UTI (urinary tract infection)   . Hypercholesteremia   . Hypertension   . Lichen simplex chronicus   . Lumbar stenosis with neurogenic claudication   . Macular degeneration   . Osteoarthritis   . Osteopenia    of the hip  . PONV (postoperative nausea and vomiting)   . Stress incontinence, female   . Unsteady gait   . Urinary urgency   . Wears glasses   . Whooping cough    as a baby    Past Surgical History:  Procedure Laterality Date  . ABDOMINAL HYSTERECTOMY     ovaries remain  . ANTERIOR LAT LUMBAR FUSION N/A 10/26/2017   Procedure: LUMBAR THREE- LUMBAR FOUR, LUMBAR FOUR- LUMBAR FIVE ANTEROLATERAL LUMBAR INTERBODY ARTHRODESIS;  Surgeon: Jovita Gamma, MD;  Location: [facility];   Service: Neurosurgery;  Laterality: N/A;  LUMBAR 3- LUMBAR 4, LUMBAR 4- LUMBAR 5 ANTEROLATERAL LUMBAR INTERBODY ARTHRODESIS, LUMBAR 3- LUMBAR 4, LUMBAR 4- LUMBAR 5 PERCUTANEOUS PEDICLE SCREW FIXATION  . APPENDECTOMY    . BLADDER REPAIR     x two  . CHOLECYSTECTOMY    . COLONOSCOPY    . EYE SURGERY  11/14, 12/14   Macular Dengeneration, Glaucoma  . EYE SURGERY  2016   left eye  . LESION REMOVAL Right 06/28/2013   Procedure: EXCISION 3 cm right labial sebaceous cyst;  Surgeon: Lyman Speller, MD;  Location: [facility];  Service: Gynecology;  Laterality: Right;  . LUMBAR LAMINECTOMY/DECOMPRESSION MICRODISCECTOMY Right 03/20/2016   Procedure: Right - Lumbar four-five lumbar laminotomy, foraminotomy, and possible microdiscectomy;  Surgeon: Jovita Gamma, MD;  Location: [facility];  Service: Neurosurgery;  Laterality: Right;  right  . LUMBAR PERCUTANEOUS PEDICLE SCREW 2 LEVEL  10/26/2017   Procedure: LUMBAR THREE- LUMBAR FOUR, LUMBAR FOUR- LUMBAR FIVE PERCUTANEOUS PEDICLE SCREW FIXATION;  Surgeon: Jovita Gamma, MD;  Location: [facility];  Service: Neurosurgery;;  . MOHS SURGERY     procedure to remove basal cell  . TONSILLECTOMY AND ADENOIDECTOMY    . URETHRAL SLING  2007  . URETHRAL SLING  1/12   midurethral   . vertebroplasty secondary to traumatic compression fracture      There were no vitals filed for this visit.  Subjective Assessment - 02/02/18 1104    Subjective  Pt reports that she had a bad day yesterday, so she had to wear her brace and use the biofreeze. She feels she is about 75% improved since beginning PT. She can walk without her cane at home and short distances in the community, but she still needs it for walking on concrete.     Patient Stated Goals  improve Rt leg strength    Currently in Pain?  No/denies         [facility] PT Assessment - 02/02/18 0001      Assessment   Medical Diagnosis  Spondylosis without myelopathy or radiculopathy, lumbar region    Onset  Date/Surgical Date  10/26/17    Next MD Visit  01/11/18    Prior Therapy  none      Precautions   Precautions  Back    Required Braces or Orthoses  Spinal Brace    Spinal Brace  Lumbar corset;Applied in sitting position      Restrictions   Weight Bearing Restrictions  No      Home Film/video editor residence    Living Arrangements  Spouse/significant other    Type of [place] to enter    Entrance Stairs-Number of Steps  2    Ages  One level    [place] - 2 wheels;Bedside commode      Prior Function   Level of Independence  Independent    Vocation  Retired    Leisure  play sudoku/puzzles on iphone, watch TV, no regular exercise      Cognition   Overall Cognitive Status  No family/caregiver present to determine baseline cognitive functioning    Memory  Impaired    Memory Impairment  Decreased recall of new information;Decreased short term memory    Decreased Short Term Memory  Verbal basic      Observation/Other Assessments   Skin Integrity  healing bil incisions along lower thoracic/lumbar spine    Focus on Therapeutic Outcomes (FOTO)   31 (69% limited; predicted 54% limited)      Posture/Postural Control   Posture/Postural Control  Postural limitations    Postural Limitations  Rounded Shoulders;Forward head;Decreased lumbar lordosis      AROM   Overall AROM Comments  deferred      Strength   Overall Strength Comments  tested in sitting only due to post-op status; suspect hip extension and abduction weakness due to functional limitations and gait abnormalities    Right/Left Hip  Right;Left    Right Hip Flexion  4/5    Right Hip ABduction  2/5    Left Hip Flexion  4/5    Left Hip ABduction  4/5    Right Knee Flexion  4/5    Right Knee Extension  5/5    Left Knee Flexion  4/5    Left Knee Extension  5/5    Right Ankle Dorsiflexion  3/5    Left Ankle  Dorsiflexion  5/5      Flexibility   Soft Tissue Assessment /Muscle Length  yes    Hamstrings  tightness bil    Piriformis  tightness bil      Palpation  Palpation comment  --      Bed Mobility   Bed Mobility  Sit to Sidelying Left      Transfers   Five time sit to stand comments   9 sec, no UE support       Ambulation/Gait   Ambulation/Gait  --    Ambulation/Gait Assistance  --    Ambulation Distance (Feet)  --    Assistive device  --    Gait Pattern  --    Gait Comments  Pt using Rt SPC ambulating in clinic       Standardized Balance Assessment   Standardized Balance Assessment  Timed Up and Go Test      Timed Up and Go Test   Normal TUG (seconds)  12 with cane               Nustep L2/L4 interval every minute x10 min    [facility] Adult PT Treatment/Exercise - 02/02/18 0001      Lumbar Exercises: Supine   Bridge  15 reps    Isometric Hip Flexion  10 reps;3 seconds;Limitations    Isometric Hip Flexion Limitations  Lt and Rt side    Other Supine Lumbar Exercises  BUE pressdown isometric x3 sec hold for 15 reps       Knee/Hip Exercises: Standing   Other Standing Knee Exercises  side stepping at countertop Lt/Rt x3 round trips       Knee/Hip Exercises: Seated   Clamshell with TheraBand  Green 2x10 reps each      Knee/Hip Exercises: Supine   Other Supine Knee/Hip Exercises  hip abduction/adduction slide 2x10 on Rt, x10 on Lt with BUE pressdown              PT Education - 02/02/18 1131    Education provided  Yes    Education Details  progress towards goals, and goals moving forward; updated HEP    Person(s) Educated  Patient    Methods  Explanation;Verbal cues;Handout    Comprehension  Verbalized understanding;Returned demonstration       PT Short Term Goals - 02/02/18 1136      PT SHORT TERM GOAL #1   Title  verbalize understanding of posture/body mechanics to decrease risk of reinjury    Status  Achieved      PT SHORT TERM GOAL #2   Title   improve functional strength by demonstrating ability to stand without UE support at least 3/5 trials    Status  Achieved      PT SHORT TERM GOAL #3   Title  improve timed up and go to < 20 sec for improved functional mobility    Baseline  9 sec     Period  Weeks    Status  Achieved        PT Long Term Goals - 02/02/18 1136      PT LONG TERM GOAL #1   Title  pt will be independent with an advanced HEP to allow for maintenance of her strength, ROM and endurance after discharge from PT.     Status  On-going      PT LONG TERM GOAL #2   Title  demonstrate improved functional strength by performing 5x STS in < 20 sec withou UE support    Baseline  9 sec    Status  Achieved      PT LONG TERM GOAL #3   Title  improve timed up and go to < 17 sec with LRAD  for improved function    Baseline  12 sec    Status  Achieved      PT LONG TERM GOAL #4   Title  amb > 250' with LRAD on various indoor/outdoor surfaces modified independently for improved function    Baseline  using SPC and close supervision    Status  On-going      PT LONG TERM GOAL #5   Title  report pain < 4/10 with activity for improved functional mobility    Baseline  reports not having any pain with daily activity     Status  Achieved      Additional Long Term Goals   Additional Long Term Goals  Yes      PT LONG TERM GOAL #6   Title  Pt will demo improved Rt hip strength to atleast 4/5 MMT which will improve her safety with ambulation and other activity throughout the day.    Time  4    Period  Weeks    Status  New    Target Date  03/04/18            Plan - 02/02/18 1133    Clinical Impression Statement  Pt has made good progress towards her goals since beginning PT. She feels overall 75% improved, and reports little back pain throughout the day. Last session she performed well on the TUG and 5x sit to stand within limits for someone her age. Pt does demonstrate remaining limitations Rt hip strength, no greater  than 2/5 MMT into hip abduction. She still requires an AD when ambulating over uneven surfaces outside, and she demonstrates limited trunk strength during her sessions. She would benefit from skilled PT to address her remaining limitations strength, endurance and proprioception.     Rehab Potential  Good    PT Frequency  2x / week    PT Duration  4 weeks    PT Treatment/Interventions  ADLs/Self Care Home Management;Cryotherapy;Electrical Stimulation;Moist Heat;Traction;Therapeutic exercise;Therapeutic activities;Functional mobility training;Stair training;Gait training;DME Instruction;Ultrasound;Balance training;Neuromuscular re-education;Patient/family education;Manual techniques;Taping;Dry needling    PT Next Visit Plan  hip abduction/extension strength; balance activity, walking uneven surfaces    PT Home Exercise Plan  Access Code: [email or web address]    Consulted and Agree with Plan of Care  Patient       Patient will benefit from skilled therapeutic intervention in order to improve the following deficits and impairments:  Abnormal gait, Decreased balance, Decreased cognition, Decreased knowledge of use of DME, Decreased mobility, Decreased safety awareness, Impaired flexibility, Postural dysfunction, Improper body mechanics, Impaired perceived functional ability, Difficulty walking, Increased muscle spasms, Pain, Increased fascial restricitons, Decreased strength, Decreased endurance  Visit Diagnosis: Chronic right-sided low back pain without sciatica  Muscle weakness (generalized)  History of falling  Unsteadiness on feet     Problem List Patient Active Problem List   Diagnosis Date Noted  . Chest pain 01/24/2018  . Liver lesion 11/10/2017  . Nausea & vomiting 10/31/2017  . Chronic daily headache 07/08/2017  . Memory changes 03/06/2017  . Lumbar stenosis with neurogenic claudication 03/20/2016  . Scalp pain 10/22/2015  . Post-traumatic headache 10/11/2015  . Routine health maintenance  03/06/2013  . Macular degeneration of right eye 03/03/2013  . Glaucoma 03/03/2013  . Hyperlipidemia 03/14/2009  . Depression 10/19/2007  . Essential hypertension 10/19/2007  . GASTROESOPHAGEAL REFLUX DISEASE 10/19/2007  . Osteoarthritis 10/19/2007  . STRESS INCONTINENCE 10/19/2007  . Allergic state 10/19/2007   11:56 AM,02/02/18 Sherol Dade PT, [place]  at [facility] [address], [place], [place], [address] Phone: [phone]   Fax:  [phone]  Name: [name] MRN: [id number] Date of Birth: [birth year]

## 2018-02-04 ENCOUNTER — Ambulatory Visit: Payer: Medicare Other | Admitting: Physical Therapy

## 2018-02-04 DIAGNOSIS — G8929 Other chronic pain: Secondary | ICD-10-CM

## 2018-02-04 DIAGNOSIS — M6281 Muscle weakness (generalized): Secondary | ICD-10-CM

## 2018-02-04 DIAGNOSIS — M545 Low back pain: Secondary | ICD-10-CM | POA: Diagnosis not present

## 2018-02-04 DIAGNOSIS — R2681 Unsteadiness on feet: Secondary | ICD-10-CM

## 2018-02-04 DIAGNOSIS — Z9181 History of falling: Secondary | ICD-10-CM

## 2018-02-04 NOTE — Telephone Encounter (Addendum)
Per chart review: Admit date: 01/24/2018 Discharge date: 01/25/2018  Admitted From: home Discharge disposition: home   Recommendations for Outpatient Follow-Up:   Outpatient PT Outpatient stress test  Discharge Diagnosis:   Principal Problem:   Chest pain Active Problems:   Hyperlipidemia   Depression   Essential hypertension   GASTROESOPHAGEAL REFLUX DISEASE   Osteoarthritis   Lumbar stenosis with neurogenic claudication    Discharge Condition: Improved.  Diet recommendation: Low sodium, heart healthy  Wound care: None.  Code status: Full.  ___________________________________________________ Transition Care Management Follow-up Telephone Call   Date discharged? 01/25/18   How have you been since you were released from the hospital? "Fine"   Do you understand why you were in the hospital? yes   Do you understand the discharge instructions? yes   Where were you discharged to? Home   Items Reviewed:  Medications reviewed: yes  Allergies reviewed: yes  Dietary changes reviewed: yes  Referrals reviewed: yes   Functional Questionnaire:   Activities of Daily Living (ADLs):   She states they are independent in the following: ambulation, bathing and hygiene, feeding, continence, grooming, toileting and dressing States they require assistance with the following: none   Any transportation issues/concerns?: no   Any patient concerns? No. She would like Dr Yong Channel to know that she only has a few more sessions of physical therapy, she is walking without a cane now. She also is having a myocardial perfusion on 02/05/18.    Confirmed importance and date/time of follow-up visits scheduled yes  Provider Appointment booked with Dr Yong Channel 02/17/18  Confirmed with patient if condition begins to worsen call PCP or go to the ER.  Patient was given the office number and encouraged to call back with question or concerns.  : yes

## 2018-02-04 NOTE — Therapy (Signed)
[facility] [address], [place], [place], [address] Phone: [phone]   Fax:  [phone]  Physical Therapy Treatment  Patient Details  Name: [name] MRN: [id number] Date of Birth: June 26, 1936 Referring Provider: Jovita Gamma, MD   Encounter Date: 02/04/2018  PT End of Session - 02/04/18 1338    Visit Number  12    Number of Visits  16    Date for PT Re-Evaluation  03/04/18    Authorization Type  [employer] Medicare (Progress note completed on 10th visit)    Authorization Time Period  02/02/18 to 03/04/18    Authorization - Visit Number  2    Authorization - Number of Visits  10    PT Start Time  1150    PT Stop Time  1229    PT Time Calculation (min)  39 min    Equipment Utilized During Treatment  Back brace    Activity Tolerance  Patient tolerated treatment well;No increased pain    Behavior During Therapy  WFL for tasks assessed/performed       Past Medical History:  Diagnosis Date  . Anxiety   . Colon polyps   . Complication of anesthesia    takes longer to wake up- admitted overnight after colonoscopy  . Depression   . GERD (gastroesophageal reflux disease)   . Glaucoma   . Headache    migraines  . Hx: UTI (urinary tract infection)   . Hypercholesteremia   . Hypertension   . Lichen simplex chronicus   . Lumbar stenosis with neurogenic claudication   . Macular degeneration   . Osteoarthritis   . Osteopenia    of the hip  . PONV (postoperative nausea and vomiting)   . Stress incontinence, female   . Unsteady gait   . Urinary urgency   . Wears glasses   . Whooping cough    as a baby    Past Surgical History:  Procedure Laterality Date  . ABDOMINAL HYSTERECTOMY     ovaries remain  . ANTERIOR LAT LUMBAR FUSION N/A 10/26/2017   Procedure: LUMBAR THREE- LUMBAR FOUR, LUMBAR FOUR- LUMBAR FIVE ANTEROLATERAL LUMBAR INTERBODY ARTHRODESIS;  Surgeon: Jovita Gamma, MD;  Location: [facility];   Service: Neurosurgery;  Laterality: N/A;  LUMBAR 3- LUMBAR 4, LUMBAR 4- LUMBAR 5 ANTEROLATERAL LUMBAR INTERBODY ARTHRODESIS, LUMBAR 3- LUMBAR 4, LUMBAR 4- LUMBAR 5 PERCUTANEOUS PEDICLE SCREW FIXATION  . APPENDECTOMY    . BLADDER REPAIR     x two  . CHOLECYSTECTOMY    . COLONOSCOPY    . EYE SURGERY  11/14, 12/14   Macular Dengeneration, Glaucoma  . EYE SURGERY  2016   left eye  . LESION REMOVAL Right 06/28/2013   Procedure: EXCISION 3 cm right labial sebaceous cyst;  Surgeon: Lyman Speller, MD;  Location: [facility];  Service: Gynecology;  Laterality: Right;  . LUMBAR LAMINECTOMY/DECOMPRESSION MICRODISCECTOMY Right 03/20/2016   Procedure: Right - Lumbar four-five lumbar laminotomy, foraminotomy, and possible microdiscectomy;  Surgeon: Jovita Gamma, MD;  Location: [facility];  Service: Neurosurgery;  Laterality: Right;  right  . LUMBAR PERCUTANEOUS PEDICLE SCREW 2 LEVEL  10/26/2017   Procedure: LUMBAR THREE- LUMBAR FOUR, LUMBAR FOUR- LUMBAR FIVE PERCUTANEOUS PEDICLE SCREW FIXATION;  Surgeon: Jovita Gamma, MD;  Location: [facility];  Service: Neurosurgery;;  . MOHS SURGERY     procedure to remove basal cell  . TONSILLECTOMY AND ADENOIDECTOMY    . URETHRAL SLING  2007  . URETHRAL SLING  1/12   midurethral   . vertebroplasty secondary to traumatic compression fracture      There were no vitals filed for this visit.  Subjective Assessment - 02/04/18 1156    Subjective  Pt reports things are going well. She felt really good after her last session. She was sore the day following.    Patient Stated Goals  improve Rt leg strength    Currently in Pain?  No/denies                       [facility] Adult PT Treatment/Exercise - 02/04/18 0001      Lumbar Exercises: Seated   Sit to Stand  10 reps;Limitations    Sit to Stand Limitations  x2 sets  last 5 reps instructed to stop before sitting    Other Seated Lumbar Exercises  adductor ball squeeze x15 reps       Lumbar Exercises:  Supine   Bridge  15 reps;Limitations    Bridge Limitations  therapist providing manual resistance to improve glute recruitment      Lumbar Exercises: Sidelying   Hip Abduction  10 reps;Both;Limitations    Hip Abduction Limitations  x2 sets each side       Knee/Hip Exercises: Standing   Heel Raises  Limitations;Both;1 set    Heel Raises Limitations  x25 reps       Knee/Hip Exercises: Seated   Clamshell with TheraBand  Green 2x10 reps           Balance Exercises - 02/04/18 1221      Balance Exercises: Standing   Standing Eyes Opened  Narrow base of support (BOS);2 reps;30 secs;Solid surface external perturbations    Tandem Stance  Eyes open;2 reps;30 secs    Other Standing Exercises  weight shifting Lt/Rt x20 reps (no UE support)         PT Education - 02/04/18 1336    Education provided  Yes    Education Details  technique with therex     Person(s) Educated  Patient    Methods  Explanation;Tactile cues;Verbal cues    Comprehension  Verbalized understanding;Returned demonstration       PT Short Term Goals - 02/02/18 1136      PT SHORT TERM GOAL #1   Title  verbalize understanding of posture/body mechanics to decrease risk of reinjury    Status  Achieved      PT SHORT TERM GOAL #2   Title  improve functional strength by demonstrating ability to stand without UE support at least 3/5 trials    Status  Achieved      PT SHORT TERM GOAL #3   Title  improve timed up and go to < 20 sec for improved functional mobility    Baseline  9 sec     Period  Weeks    Status  Achieved        PT Long Term Goals - 02/02/18 1136      PT LONG TERM GOAL #1   Title  pt will be independent with an advanced HEP to allow for maintenance of her strength, ROM and endurance after discharge from PT.     Status  On-going      PT LONG TERM GOAL #2   Title  demonstrate improved functional strength by performing 5x STS in < 20 sec withou UE support    Baseline  9 sec    Status  Achieved       PT  LONG TERM GOAL #3   Title  improve timed up and go to < 17 sec with LRAD for improved function    Baseline  12 sec    Status  Achieved      PT LONG TERM GOAL #4   Title  amb > 250' with LRAD on various indoor/outdoor surfaces modified independently for improved function    Baseline  using SPC and close supervision    Status  On-going      PT LONG TERM GOAL #5   Title  report pain < 4/10 with activity for improved functional mobility    Baseline  reports not having any pain with daily activity     Status  Achieved      Additional Long Term Goals   Additional Long Term Goals  Yes      PT LONG TERM GOAL #6   Title  Pt will demo improved Rt hip strength to atleast 4/5 MMT which will improve her safety with ambulation and other activity throughout the day.    Time  4    Period  Weeks    Status  New    Target Date  03/04/18            Plan - 02/04/18 1338    Clinical Impression Statement  Today's session focused on therex to address hip weakness noted at last reassessment. Pt was able to complete all exercises with intermittent cuing from the therapist, but reported no pain end of session. Therapist completed balance activity with the pt and she required MinA at times to prevent LOB. Will continue to work on this in future sessions for improved safety with activity.      Rehab Potential  Good    PT Frequency  2x / week    PT Duration  4 weeks    PT Treatment/Interventions  ADLs/Self Care Home Management;Cryotherapy;Electrical Stimulation;Moist Heat;Traction;Therapeutic exercise;Therapeutic activities;Functional mobility training;Stair training;Gait training;DME Instruction;Ultrasound;Balance training;Neuromuscular re-education;Patient/family education;Manual techniques;Taping;Dry needling    PT Next Visit Plan  hip abduction/extension strength; balance activity, walking uneven surfaces    PT Home Exercise Plan  Access Code: [email or web address]    Consulted and Agree with Plan of Care   Patient       Patient will benefit from skilled therapeutic intervention in order to improve the following deficits and impairments:  Abnormal gait, Decreased balance, Decreased cognition, Decreased knowledge of use of DME, Decreased mobility, Decreased safety awareness, Impaired flexibility, Postural dysfunction, Improper body mechanics, Impaired perceived functional ability, Difficulty walking, Increased muscle spasms, Pain, Increased fascial restricitons, Decreased strength, Decreased endurance  Visit Diagnosis: Chronic right-sided low back pain without sciatica  Muscle weakness (generalized)  History of falling  Unsteadiness on feet     Problem List Patient Active Problem List   Diagnosis Date Noted  . Chest pain 01/24/2018  . Liver lesion 11/10/2017  . Nausea & vomiting 10/31/2017  . Chronic daily headache 07/08/2017  . Memory changes 03/06/2017  . Lumbar stenosis with neurogenic claudication 03/20/2016  . Scalp pain 10/22/2015  . Post-traumatic headache 10/11/2015  . Routine health maintenance 03/06/2013  . Macular degeneration of right eye 03/03/2013  . Glaucoma 03/03/2013  . Hyperlipidemia 03/14/2009  . Depression 10/19/2007  . Essential hypertension 10/19/2007  . GASTROESOPHAGEAL REFLUX DISEASE 10/19/2007  . Osteoarthritis 10/19/2007  . STRESS INCONTINENCE 10/19/2007  . Allergic state 10/19/2007   1:54 PM,02/04/18 Sherol Dade PT, DPT [place] [facility] [address] [place], [place], [address] Phone: [phone]   Fax:  [phone]  Name: [name] MRN: [id number] Date of Birth: 1935/10/18

## 2018-02-04 NOTE — Telephone Encounter (Signed)
Noted thanks °

## 2018-02-05 ENCOUNTER — Ambulatory Visit (HOSPITAL_COMMUNITY): Payer: Medicare Other | Attending: Cardiovascular Disease

## 2018-02-05 DIAGNOSIS — I10 Essential (primary) hypertension: Secondary | ICD-10-CM | POA: Diagnosis not present

## 2018-02-05 DIAGNOSIS — R079 Chest pain, unspecified: Secondary | ICD-10-CM

## 2018-02-05 LAB — MYOCARDIAL PERFUSION IMAGING
CHL CUP RESTING HR STRESS: 67 {beats}/min
LHR: 0.39
LVDIAVOL: 40 mL (ref 46–106)
LVSYSVOL: 10 mL
NUC STRESS TID: 1.01
Peak HR: 85 {beats}/min
SDS: 0
SRS: 14
SSS: 10

## 2018-02-05 MED ORDER — REGADENOSON 0.4 MG/5ML IV SOLN
0.4000 mg | Freq: Once | INTRAVENOUS | Status: AC
  Administered 2018-02-05: 0.4 mg via INTRAVENOUS

## 2018-02-05 MED ORDER — TECHNETIUM TC 99M TETROFOSMIN IV KIT
32.2000 | PACK | Freq: Once | INTRAVENOUS | Status: AC | PRN
  Administered 2018-02-05: 32.2 via INTRAVENOUS
  Filled 2018-02-05: qty 33

## 2018-02-05 MED ORDER — TECHNETIUM TC 99M TETROFOSMIN IV KIT
10.2000 | PACK | Freq: Once | INTRAVENOUS | Status: AC | PRN
  Administered 2018-02-05: 10.2 via INTRAVENOUS
  Filled 2018-02-05: qty 11

## 2018-02-08 ENCOUNTER — Ambulatory Visit: Payer: Medicare Other | Attending: Neurosurgery | Admitting: Physical Therapy

## 2018-02-08 DIAGNOSIS — G8929 Other chronic pain: Secondary | ICD-10-CM | POA: Insufficient documentation

## 2018-02-08 DIAGNOSIS — M545 Low back pain: Secondary | ICD-10-CM | POA: Insufficient documentation

## 2018-02-08 DIAGNOSIS — M6281 Muscle weakness (generalized): Secondary | ICD-10-CM | POA: Diagnosis present

## 2018-02-08 DIAGNOSIS — R2681 Unsteadiness on feet: Secondary | ICD-10-CM | POA: Diagnosis present

## 2018-02-08 DIAGNOSIS — Z9181 History of falling: Secondary | ICD-10-CM

## 2018-02-08 NOTE — Therapy (Signed)
[facility] [address] [place], [place], [address] Phone: [phone]   Fax:  [phone]  Physical Therapy Treatment  Patient Details  Name: [name] MRN: [id number] Date of Birth: [birth year] Referring Provider: Jovita Gamma, MD   Encounter Date: 02/08/2018  PT End of Session - 02/08/18 2025    Visit Number  13    Number of Visits  16    Date for PT Re-Evaluation  03/04/18    Authorization Type  [employer] Medicare (Progress note completed on 10th visit)    Authorization Time Period  02/02/18 to 03/04/18    Authorization - Visit Number  3    Authorization - Number of Visits  10    PT Start Time  4081    PT Stop Time  1612    PT Time Calculation (min)  42 min    Equipment Utilized During Treatment  Back brace    Activity Tolerance  Patient tolerated treatment well;No increased pain    Behavior During Therapy  WFL for tasks assessed/performed       Past Medical History:  Diagnosis Date  . Anxiety   . Colon polyps   . Complication of anesthesia    takes longer to wake up- admitted overnight after colonoscopy  . Depression   . GERD (gastroesophageal reflux disease)   . Glaucoma   . Headache    migraines  . Hx: UTI (urinary tract infection)   . Hypercholesteremia   . Hypertension   . Lichen simplex chronicus   . Lumbar stenosis with neurogenic claudication   . Macular degeneration   . Osteoarthritis   . Osteopenia    of the hip  . PONV (postoperative nausea and vomiting)   . Stress incontinence, female   . Unsteady gait   . Urinary urgency   . Wears glasses   . Whooping cough    as a baby    Past Surgical History:  Procedure Laterality Date  . ABDOMINAL HYSTERECTOMY     ovaries remain  . ANTERIOR LAT LUMBAR FUSION N/A 10/26/2017   Procedure: LUMBAR THREE- LUMBAR FOUR, LUMBAR FOUR- LUMBAR FIVE ANTEROLATERAL LUMBAR INTERBODY ARTHRODESIS;  Surgeon: Jovita Gamma, MD;  Location: [place];   Service: Neurosurgery;  Laterality: N/A;  LUMBAR 3- LUMBAR 4, LUMBAR 4- LUMBAR 5 ANTEROLATERAL LUMBAR INTERBODY ARTHRODESIS, LUMBAR 3- LUMBAR 4, LUMBAR 4- LUMBAR 5 PERCUTANEOUS PEDICLE SCREW FIXATION  . APPENDECTOMY    . BLADDER REPAIR     x two  . CHOLECYSTECTOMY    . COLONOSCOPY    . EYE SURGERY  11/14, 12/14   Macular Dengeneration, Glaucoma  . EYE SURGERY  2016   left eye  . LESION REMOVAL Right 06/28/2013   Procedure: EXCISION 3 cm right labial sebaceous cyst;  Surgeon: Lyman Speller, MD;  Location: [facility];  Service: Gynecology;  Laterality: Right;  . LUMBAR LAMINECTOMY/DECOMPRESSION MICRODISCECTOMY Right 03/20/2016   Procedure: Right - Lumbar four-five lumbar laminotomy, foraminotomy, and possible microdiscectomy;  Surgeon: Jovita Gamma, MD;  Location: [facility];  Service: Neurosurgery;  Laterality: Right;  right  . LUMBAR PERCUTANEOUS PEDICLE SCREW 2 LEVEL  10/26/2017   Procedure: LUMBAR THREE- LUMBAR FOUR, LUMBAR FOUR- LUMBAR FIVE PERCUTANEOUS PEDICLE SCREW FIXATION;  Surgeon: Jovita Gamma, MD;  Location: [place];  Service: Neurosurgery;;  . MOHS SURGERY     procedure to remove basal cell  . TONSILLECTOMY AND ADENOIDECTOMY    . URETHRAL SLING  2007  . URETHRAL SLING  1/12   midurethral   . vertebroplasty secondary to traumatic compression fracture      There were no vitals filed for this visit.  Subjective Assessment - 02/08/18 1533    Subjective  Pt reports that things are going ok. She has not been as good about working on her exercises, but she did clean out her daughter's bedroom.     Patient Stated Goals  improve Rt leg strength    Currently in Pain?  No/denies                       [facility] Adult PT Treatment/Exercise - 02/08/18 0001      Knee/Hip Exercises: Standing   Hip Abduction  Both;1 set;15 reps;Knee straight    Hip Extension  Both;2 sets;Knee straight;15 reps    Extension Limitations  cues to decrease forward trunk flexion    Other  Standing Knee Exercises  NBOS 3x30 sec with CGA      Knee/Hip Exercises: Seated   Clamshell with [employer]ra[employer]  Blue 2x15 on Rt, 1x15 on Lt       Knee/Hip Exercises: Sidelying   Hip ABduction  2 sets;10 reps;Both      Manual Therapy   Manual Therapy  Myofascial release    Myofascial Release  trigger point release Rt glute med             PT Education - 02/08/18 2022    Education provided  Yes    Education Details  technique with therex    Person(s) Educated  Patient    Methods  Explanation;Verbal cues;Tactile cues    Comprehension  Verbalized understanding;Returned demonstration       PT Short Term Goals - 02/02/18 1136      PT SHORT TERM GOAL #1   Title  verbalize understanding of posture/body mechanics to decrease risk of reinjury    Status  Achieved      PT SHORT TERM GOAL #2   Title  improve functional strength by demonstrating ability to stand without UE support at least 3/5 trials    Status  Achieved      PT SHORT TERM GOAL #3   Title  improve timed up and go to < 20 sec for improved functional mobility    Baseline  9 sec     Period  Weeks    Status  Achieved        PT Long Term Goals - 02/02/18 1136      PT LONG TERM GOAL #1   Title  pt will be independent with an advanced HEP to allow for maintenance of her strength, ROM and endurance after discharge from PT.     Status  On-going      PT LONG TERM GOAL #2   Title  demonstrate improved functional strength by performing 5x STS in < 20 sec withou UE support    Baseline  9 sec    Status  Achieved      PT LONG TERM GOAL #3   Title  improve timed up and go to < 17 sec with LRAD for improved function    Baseline  12 sec    Status  Achieved      PT LONG TERM GOAL #4   Title  amb > 250' with LRAD on various indoor/outdoor surfaces modified independently for improved function    Baseline  using SPC and close supervision    Status  On-going      PT  LONG TERM GOAL #5   Title  report pain < 4/10 with  activity for improved functional mobility    Baseline  reports not having any pain with daily activity     Status  Achieved      Additional Long Term Goals   Additional Long Term Goals  Yes      PT LONG TERM GOAL #6   Title  Pt will demo improved Rt hip strength to atleast 4/5 MMT which will improve her safety with ambulation and other activity throughout the day.    Time  4    Period  Weeks    Status  New    Target Date  03/04/18            Plan - 02/08/18 2027    Clinical Impression Statement  Continued this session with therex to increase the pt's hip strength and stability. Pt had pain with Rt hip abduction exercise, with palpable trigger points along the glute max and med. Completed trigger point release which allowed pt to resume the exercise pain free. Pt demonstrated muscle fatigue end of session, but noted no increase in pain.     Rehab Potential  Good    PT Frequency  2x / week    PT Duration  4 weeks    PT Treatment/Interventions  ADLs/Self Care Home Management;Cryotherapy;Electrical Stimulation;Moist Heat;Traction;Therapeutic exercise;Therapeutic activities;Functional mobility training;Stair training;Gait training;DME Instruction;Ultrasound;Balance training;Neuromuscular re-education;Patient/family education;Manual techniques;Taping;Dry needling    PT Next Visit Plan  hip abduction/extension strength; balance activity, walking uneven surfaces    PT Home Exercise Plan  Access Code: [email or web address]    Consulted and Agree with Plan of Care  Patient       Patient will benefit from skilled therapeutic intervention in order to improve the following deficits and impairments:  Abnormal gait, Decreased balance, Decreased cognition, Decreased knowledge of use of DME, Decreased mobility, Decreased safety awareness, Impaired flexibility, Postural dysfunction, Improper body mechanics, Impaired perceived functional ability, Difficulty walking, Increased muscle spasms, Pain, Increased fascial  restricitons, Decreased strength, Decreased endurance  Visit Diagnosis: Chronic right-sided low back pain without sciatica  Muscle weakness (generalized)  History of falling  Unsteadiness on feet     Problem List Patient Active Problem List   Diagnosis Date Noted  . Chest pain 01/24/2018  . Liver lesion 11/10/2017  . Nausea & vomiting 10/31/2017  . Chronic daily headache 07/08/2017  . Memory changes 03/06/2017  . Lumbar stenosis with neurogenic claudication 03/20/2016  . Scalp pain 10/22/2015  . Post-traumatic headache 10/11/2015  . Routine health maintenance 03/06/2013  . Macular degeneration of right eye 03/03/2013  . Glaucoma 03/03/2013  . Hyperlipidemia 03/14/2009  . Depression 10/19/2007  . Essential hypertension 10/19/2007  . GASTROESOPHAGEAL REFLUX DISEASE 10/19/2007  . Osteoarthritis 10/19/2007  . STRESS INCONTINENCE 10/19/2007  . Allergic state 10/19/2007    8:48 PM,02/08/18 Sherol Dade PT, DPT [place] [facility] [place]  [facility] [address] [place], [place], [address] Phone: [phone]   Fax:  [phone]  Name: [name] MRN: [id number] Date of Birth: 1935/09/05

## 2018-02-11 ENCOUNTER — Other Ambulatory Visit: Payer: Self-pay | Admitting: Family Medicine

## 2018-02-15 ENCOUNTER — Encounter: Payer: Self-pay | Admitting: Physical Therapy

## 2018-02-15 ENCOUNTER — Ambulatory Visit: Payer: Medicare Other | Admitting: Physical Therapy

## 2018-02-15 ENCOUNTER — Telehealth: Payer: Self-pay | Admitting: Physical Therapy

## 2018-02-15 DIAGNOSIS — M545 Low back pain, unspecified: Secondary | ICD-10-CM

## 2018-02-15 DIAGNOSIS — Z9181 History of falling: Secondary | ICD-10-CM

## 2018-02-15 DIAGNOSIS — R2681 Unsteadiness on feet: Secondary | ICD-10-CM

## 2018-02-15 DIAGNOSIS — G8929 Other chronic pain: Secondary | ICD-10-CM

## 2018-02-15 DIAGNOSIS — M6281 Muscle weakness (generalized): Secondary | ICD-10-CM

## 2018-02-15 NOTE — Telephone Encounter (Signed)
Called pt about today's appointment. Her husband states she is on the way.  9:46 AM,02/15/18 [place], [place] [facility] [place]

## 2018-02-15 NOTE — Therapy (Signed)
[facility] [address] [place], [place], [address] Phone: [phone]   Fax:  [phone]  Physical Therapy Treatment  Patient Details  Name: [name] MRN: [id number] Date of Birth: 06/03/1936 Referring Provider: Jovita Gamma, MD   Encounter Date: 02/15/2018  PT End of Session - 02/15/18 1020    Visit Number  14    Number of Visits  16    Date for PT Re-Evaluation  03/04/18    Authorization Type  [employer] Medicare (Progress note completed on 10th visit)    Authorization Time Period  02/02/18 to 03/04/18    Authorization - Visit Number  4    Authorization - Number of Visits  10    PT Start Time  1829    PT Stop Time  9371 pt arrived late to her appointment     PT Time Calculation (min)  25 min    Equipment Utilized During Treatment  Back brace    Activity Tolerance  Patient tolerated treatment well;No increased pain    Behavior During Therapy  WFL for tasks assessed/performed       Past Medical History:  Diagnosis Date  . Anxiety   . Colon polyps   . Complication of anesthesia    takes longer to wake up- admitted overnight after colonoscopy  . Depression   . GERD (gastroesophageal reflux disease)   . Glaucoma   . Headache    migraines  . Hx: UTI (urinary tract infection)   . Hypercholesteremia   . Hypertension   . Lichen simplex chronicus   . Lumbar stenosis with neurogenic claudication   . Macular degeneration   . Osteoarthritis   . Osteopenia    of the hip  . PONV (postoperative nausea and vomiting)   . Stress incontinence, female   . Unsteady gait   . Urinary urgency   . Wears glasses   . Whooping cough    as a baby    Past Surgical History:  Procedure Laterality Date  . ABDOMINAL HYSTERECTOMY     ovaries remain  . ANTERIOR LAT LUMBAR FUSION N/A 10/26/2017   Procedure: LUMBAR THREE- LUMBAR FOUR, LUMBAR FOUR- LUMBAR FIVE ANTEROLATERAL LUMBAR INTERBODY ARTHRODESIS;  Surgeon: Jovita Gamma, MD;  Location: [place];  Service: Neurosurgery;  Laterality: N/A;  LUMBAR 3- LUMBAR 4, LUMBAR 4- LUMBAR 5 ANTEROLATERAL LUMBAR INTERBODY ARTHRODESIS, LUMBAR 3- LUMBAR 4, LUMBAR 4- LUMBAR 5 PERCUTANEOUS PEDICLE SCREW FIXATION  . APPENDECTOMY    . BLADDER REPAIR     x two  . CHOLECYSTECTOMY    . COLONOSCOPY    . EYE SURGERY  11/14, 12/14   Macular Dengeneration, Glaucoma  . EYE SURGERY  2016   left eye  . LESION REMOVAL Right 06/28/2013   Procedure: EXCISION 3 cm right labial sebaceous cyst;  Surgeon: Lyman Speller, MD;  Location: [facility];  Service: Gynecology;  Laterality: Right;  . LUMBAR LAMINECTOMY/DECOMPRESSION MICRODISCECTOMY Right 03/20/2016   Procedure: Right - Lumbar four-five lumbar laminotomy, foraminotomy, and possible microdiscectomy;  Surgeon: Jovita Gamma, MD;  Location: [facility] [facility];  Service: Neurosurgery;  Laterality: Right;  right  . LUMBAR PERCUTANEOUS PEDICLE SCREW 2 LEVEL  10/26/2017   Procedure: LUMBAR THREE- LUMBAR FOUR, LUMBAR FOUR- LUMBAR FIVE PERCUTANEOUS PEDICLE SCREW FIXATION;  Surgeon: Jovita Gamma, MD;  Location: [place];  Service: Neurosurgery;;  . MOHS SURGERY     procedure to remove basal cell  . TONSILLECTOMY AND ADENOIDECTOMY    . URETHRAL SLING  2007  . URETHRAL SLING  1/12   midurethral   . vertebroplasty secondary to traumatic compression fracture      There were no vitals filed for this visit.  Subjective Assessment - 02/15/18 1001    Subjective  Pt reports that her Rt buttock region is bothering her today. HEP is going well.     Patient Stated Goals  improve Rt leg strength    Currently in Pain?  No/denies    Pain Score  --                       [facility] Adult PT Treatment/Exercise - 02/15/18 0001      Knee/Hip Exercises: Standing   Heel Raises  Both;1 set;15 reps    Heel Raises Limitations  weight shifted Rt    Forward Step Up  2 sets;10 reps;Hand Hold: 2;Step Height: 6" 2 sets on Rt     Other Standing Knee  Exercises  Lt LE/UE march against wall x15 reps     Other Standing Knee Exercises  NBOS with trunk rotation x10 reps Lt/Rt; NBOS on firm surface x30 sec       Knee/Hip Exercises: Supine   Bridges  Both;1 set;10 reps    Bridges with [employer]  Both;1 set;10 reps    Other Supine Knee/Hip Exercises  Rt hip abduction/adduction slide x15 reps       Manual Therapy   Myofascial Release  TPR Rt glute med and max             PT Education - 02/15/18 1347    Education provided  Yes    Education Details  technique with therex     Person(s) Educated  Patient    Methods  Explanation;Verbal cues    Comprehension  Verbalized understanding;Returned demonstration       PT Short Term Goals - 02/02/18 1136      PT SHORT TERM GOAL #1   Title  verbalize understanding of posture/body mechanics to decrease risk of reinjury    Status  Achieved      PT SHORT TERM GOAL #2   Title  improve functional strength by demonstrating ability to stand without UE support at least 3/5 trials    Status  Achieved      PT SHORT TERM GOAL #3   Title  improve timed up and go to < 20 sec for improved functional mobility    Baseline  9 sec     Period  Weeks    Status  Achieved        PT Long Term Goals - 02/02/18 1136      PT LONG TERM GOAL #1   Title  pt will be independent with an advanced HEP to allow for maintenance of her strength, ROM and endurance after discharge from PT.     Status  On-going      PT LONG TERM GOAL #2   Title  demonstrate improved functional strength by performing 5x STS in < 20 sec withou UE support    Baseline  9 sec    Status  Achieved      PT LONG TERM GOAL #3   Title  improve timed up and go to < 17 sec with LRAD for improved function    Baseline  12 sec    Status  Achieved      PT LONG TERM GOAL #4   Title  amb > 250' with LRAD on various indoor/outdoor  surfaces modified independently for improved function    Baseline  using SPC and close supervision    Status   On-going      PT LONG TERM GOAL #5   Title  report pain < 4/10 with activity for improved functional mobility    Baseline  reports not having any pain with daily activity     Status  Achieved      Additional Long Term Goals   Additional Long Term Goals  Yes      PT LONG TERM GOAL #6   Title  Pt will demo improved Rt hip strength to atleast 4/5 MMT which will improve her safety with ambulation and other activity throughout the day.    Time  4    Period  Weeks    Status  New    Target Date  03/04/18            Plan - 02/15/18 1343    Clinical Impression Statement  Pt reports good relief from trigger point release completed last session, with improved Rt gluteal pain overall. Continued with manual technique the gluteals with further decrease in spasm following treatment. Completed hip strengthening therex during session to increase pt's safety with functional activity and walking. Pt was able to complete balance activity with NBOS without LOB compared to previous sessions.    Rehab Potential  Good    PT Frequency  2x / week    PT Duration  4 weeks    PT Treatment/Interventions  ADLs/Self Care Home Management;Cryotherapy;Electrical Stimulation;Moist Heat;Traction;Therapeutic exercise;Therapeutic activities;Functional mobility training;Stair training;Gait training;DME Instruction;Ultrasound;Balance training;Neuromuscular re-education;Patient/family education;Manual techniques;Taping;Dry needling    PT Next Visit Plan  hip abduction/extension strength; balance activity, walking uneven surfaces    PT Home Exercise Plan  Access Code: [email or web address]    Consulted and Agree with Plan of Care  Patient       Patient will benefit from skilled therapeutic intervention in order to improve the following deficits and impairments:  Abnormal gait, Decreased balance, Decreased cognition, Decreased knowledge of use of DME, Decreased mobility, Decreased safety awareness, Impaired flexibility, Postural  dysfunction, Improper body mechanics, Impaired perceived functional ability, Difficulty walking, Increased muscle spasms, Pain, Increased fascial restricitons, Decreased strength, Decreased endurance  Visit Diagnosis: Chronic right-sided low back pain without sciatica  Muscle weakness (generalized)  History of falling  Unsteadiness on feet     Problem List Patient Active Problem List   Diagnosis Date Noted  . Chest pain 01/24/2018  . Liver lesion 11/10/2017  . Nausea & vomiting 10/31/2017  . Chronic daily headache 07/08/2017  . Memory changes 03/06/2017  . Lumbar stenosis with neurogenic claudication 03/20/2016  . Scalp pain 10/22/2015  . Post-traumatic headache 10/11/2015  . Routine health maintenance 03/06/2013  . Macular degeneration of right eye 03/03/2013  . Glaucoma 03/03/2013  . Hyperlipidemia 03/14/2009  . Depression 10/19/2007  . Essential hypertension 10/19/2007  . GASTROESOPHAGEAL REFLUX DISEASE 10/19/2007  . Osteoarthritis 10/19/2007  . STRESS INCONTINENCE 10/19/2007  . Allergic state 10/19/2007    1:49 PM,02/15/18 Sherol Dade PT, DPT [place] [facility] [address] [place], [place], [address] Phone: [phone]   Fax:  [phone]  Name: [name] MRN: [id number] Date of Birth: [birth year]

## 2018-02-16 ENCOUNTER — Ambulatory Visit: Payer: Medicare Other | Admitting: Family Medicine

## 2018-02-17 ENCOUNTER — Encounter: Payer: Self-pay | Admitting: Family Medicine

## 2018-02-17 ENCOUNTER — Ambulatory Visit: Payer: Medicare Other | Admitting: Family Medicine

## 2018-02-17 DIAGNOSIS — E785 Hyperlipidemia, unspecified: Secondary | ICD-10-CM

## 2018-02-17 DIAGNOSIS — F325 Major depressive disorder, single episode, in full remission: Secondary | ICD-10-CM

## 2018-02-17 DIAGNOSIS — R072 Precordial pain: Secondary | ICD-10-CM | POA: Diagnosis not present

## 2018-02-17 DIAGNOSIS — J301 Allergic rhinitis due to pollen: Secondary | ICD-10-CM

## 2018-02-17 DIAGNOSIS — I10 Essential (primary) hypertension: Secondary | ICD-10-CM | POA: Diagnosis not present

## 2018-02-17 MED ORDER — AZELASTINE HCL 0.1 % NA SOLN: 1.0000 | Freq: Two times a day (BID) | NASAL | 5 refills | Status: DC

## 2018-02-17 NOTE — Assessment & Plan Note (Addendum)
Will trial astelin (flonase not ideal in glaucoma and antihistamine like zyrtec has chance of causing confusion). I suspect much of her runny nose, sore throat/she describes as sore neck, PND are from allergies- could also contribute to fullness in ear (though has wax and declined irrigation today instead to trial mineral oil). If she is not improving or symptoms worsen should return to care

## 2018-02-17 NOTE — Progress Notes (Signed)
Subjective:  [name] is a 82 y.o. year old very pleasant female patient who presents for/with See problem oriented charting ROS- no chest pain since being out of hospital. Some right ear discomfort and sore neck on right side. No fever or chills.    Past Medical History-  Patient Active Problem List   Diagnosis Date Noted  . Liver lesion 11/10/2017    Priority: High  . Memory changes 03/06/2017    Priority: Medium  . Lumbar stenosis with neurogenic claudication 03/20/2016    Priority: Medium  . Scalp pain 10/22/2015    Priority: Medium  . Macular degeneration of right eye 03/03/2013    Priority: Medium  . Glaucoma 03/03/2013    Priority: Medium  . Hyperlipidemia 03/14/2009    Priority: Medium  . Depression 10/19/2007    Priority: Medium  . Essential hypertension 10/19/2007    Priority: Medium  . GASTROESOPHAGEAL REFLUX DISEASE 10/19/2007    Priority: Medium  . Chest pain 01/24/2018    Priority: Low  . Post-traumatic headache 10/11/2015    Priority: Low  . Routine health maintenance 03/06/2013    Priority: Low  . Osteoarthritis 10/19/2007    Priority: Low  . STRESS INCONTINENCE 10/19/2007    Priority: Low  . Allergic rhinitis 10/19/2007    Priority: Low  . Nausea & vomiting 10/31/2017  . Chronic daily headache 07/08/2017    Medications- reviewed and updated Current Outpatient Medications  Medication Sig Dispense Refill  . acetaminophen (TYLENOL) 500 MG tablet Take 500 mg by mouth daily as needed for mild pain or headache.     . ALPRAZolam (XANAX) 0.5 MG tablet Take 0.5-1 tablets (0.25-0.5 mg total) by mouth 2 (two) times daily as needed. for anxiety 20 tablet 0  . azelastine (ASTELIN) 0.1 % nasal spray Place 1 spray into both nostrils 2 (two) times daily. Use in each nostril as directed 30 mL 5  . brimonidine-timolol (COMBIGAN) 0.2-0.5 % ophthalmic solution Place 1 drop into both eyes every 12 (twelve) hours.    Marland Kitchen ezetimibe-simvastatin (VYTORIN) 10-20 MG  tablet Take 1 tablet by mouth daily. 90 tablet 3  . ibuprofen (ADVIL,MOTRIN) 200 MG tablet Take 200 mg by mouth every 8 (eight) hours as needed.    Marland Kitchen ketoconazole (NIZORAL) 2 % cream Apply 1 application topically daily. For circular area on left lower leg 30 g 0  . nadolol (CORGARD) 40 MG tablet Take 1 tablet (40 mg total) by mouth at bedtime. 90 tablet 1  . omeprazole (PRILOSEC) 20 MG capsule Take 20 mg by mouth daily before breakfast.    . sodium chloride (OCEAN) 0.65 % SOLN nasal spray Place 1 spray into both nostrils 2 (two) times daily as needed for congestion.    . traZODone (DESYREL) 100 MG tablet TAKE 1/4 TO 1/2 TABLET BY MOUTH AT BEDTIME IF NEEDED FOR SLEEP 45 tablet 1  . triamcinolone ointment (KENALOG) 0.5 % Apply 1 application topically 2 (two) times daily. 30 g 0  . venlafaxine XR (EFFEXOR-XR) 150 MG 24 hr capsule TAKE 1 CAPSULE BY MOUTH TWICE A DAY 60 capsule 5   No current facility-administered medications for this visit.     Objective: BP 132/76 (BP Location: Left Arm, Patient Position: Sitting, Cuff Size: Normal)   Pulse 64   Temp 97.9 F (36.6 C) (Oral)   Ht 5' (1.524 m)   Wt 162 lb 9.6 oz (73.8 kg)   LMP 08/11/1974   SpO2 97%   BMI 31.76 kg/m  Gen:  NAD, resting comfortably Some rhinorrhea noted, pharynx with signs of drainage, no tonsilar enlargement. Nasal turbinates edematous CV: RRR no murmurs rubs or gallops No chest wall pain identified Lungs: CTAB no crackles, wheeze, rhonchi Abdomen: soft/nontender/nondistended/normal bowel sounds. No rebound or guarding.  Ext: no edema Skin: warm, dry Neuro: walks with cane, normal speech  Assessment/Plan:  Hospital follow-up Patient was hospitalized from January 24, 2018 to January 25, 2018 for chest pain.  Patient is a 82 year old female with a history of hypertension and hyperlipidemia who had an episode of  chest pain.  She was admitted to the hospital in order to rule out myocardial infarction.  Cardiology was consulted.   Patient had cardiac enzymes cycled and were negative.  Her LDL was reasonably controlled at 78 on home statin.  Her hemoglobin A1c was not elevated.  Cardiology advised outpatient stress testing.  Patient also had a chest x-ray on 01/24/2018 which showed no acute cardiopulmonary disease. Echocardiogram during hospitalization shows EF 78%, diastolic dysfunction grade 1, trivial regurgitation at aortic and mitral valves. Mild regurgitation in tricuspod valve  Chronic conditions addressed/maintained during hospitalization: 1.  Patient was maintained on her home anti-hypertensive regimen with nadolol 40 mg daily and blood pressure was reasonably controlled.  Slight elevations noted in the 140s.  Blood pressure is well controlled at this time. 2.  Patient was continued on her home statin-in the form of Vytorin 10-20 mg 3.  Patient was continued on her home Xanax for anxiety 4.  For her depression she was continued on Effexor 150 mg extended release.  Her PHQ 9 today is 0. 5.  GERD with history of hiatal hernia- patient was maintained on proton pump inhibitor.  No GI cocktail as patient has had confusion on these in the past.  Some consideration of GI follow-up for endoscopy if stress test normal and chest pain persists.no recurrence of chest pain.  6.  For her history of lumbar stenosis status post back surgery in March 2019 mild residual right foot drop-patient was advised to continue outpatient physical therapy  Since discharge patient had a stress test with cardiology on 02/05/2018. This was a low risk stress test though there was a question of prior septal MI vs attenuation artifact. Has cardiology follow up later this month  She has had some right ear pain for the last few weeks that transmits down into neck. She actually noted the neck pain prior to the substernal chest pain. She was at rest when this happened.   Chest pain Complete workup so far largely reassuring including x-ray, cycled troponins,  echocardiogram, low risk stress test (though ? Of prior septal MI and to follow up with cardiology). Unclear cause of discomfort at this point- if had recurrence encouraged prompt follow up. I dont feel strongly about GI referral at this time for endoscopy even with history of hiatal hernia.   Allergic rhinitis Will trial astelin (flonase not ideal in glaucoma and antihistamine like zyrtec has chance of causing confusion). I suspect much of her runny nose, sore throat/she describes as sore neck, PND are from allergies- could also contribute to fullness in ear (though has wax and declined irrigation today instead to trial mineral oil). If she is not improving or symptoms worsen should return to care   Essential hypertension Controlled on nadolol- continue rx  Hyperlipidemia Reasonable control on vytroin with LDL under 80. If cardiology thinks prior MI then would try to push LDL under 70 though (I suspect they will lean toward attenuation  artifact though)   Depression phq9 <5 on effexor- will continue. Trazodone- takes small dose as well   Future Appointments  Date Time Provider [place]  02/18/2018  3:30 PM Sherol Dade, PT [facility]  02/23/2018 11:45 AM Sherol Dade, PT [facility]  02/25/2018 11:45 AM Sherol Dade, PT [facility]  03/02/2018 11:45 AM Sherol Dade, PT [facility]  03/04/2018 11:45 AM Sherol Dade, PT [facility]  03/09/2018 11:45 AM Sherol Dade, PT [facility]  03/10/2018  9:30 AM Imogene Burn, PA-C [facility]  03/11/2018 11:45 AM Sherol Dade, PT [facility]  04/01/2018  2:00 PM [facility]  05/14/2018  9:30 AM Megan Salon, MD [place] [facility]   Meds ordered this encounter  Medications  . azelastine (ASTELIN) 0.1 % nasal spray    Sig: Place 1 spray into both nostrils 2 (two) times daily. Use in each nostril as directed    Dispense:  30 mL    Refill:  5    Return precautions advised.   Garret Reddish, MD

## 2018-02-17 NOTE — Assessment & Plan Note (Signed)
Controlled on nadolol- continue rx

## 2018-02-17 NOTE — Assessment & Plan Note (Signed)
Complete workup so far largely reassuring including x-ray, cycled troponins, echocardiogram, low risk stress test (though ? Of prior septal MI and to follow up with cardiology). Unclear cause of discomfort at this point- if had recurrence encouraged prompt follow up. I dont feel strongly about GI referral at this time for endoscopy even with history of hiatal hernia.

## 2018-02-17 NOTE — Patient Instructions (Addendum)
I would also like for you to sign up for an annual wellness visit with one of our nurses, Cassie or Manuela Schwartz, who both specialize in the annual wellness visit. This is a free benefit under medicare that may help [place] find additional ways to help you. Some highlights are reviewing medications, lifestyle, and doing a dementia screen.  Could try mineral oil 3-4 drops in the right ear 2-3x a day to help with the ear wax. That is in the laxative aisle.   Also sounds like allergies with runny nose and post nasal drip. Try astelin nasal spray IF it is affordable. If too expensive, use a neti pt once or twice a day (make sure to follow water instructions). I think the post nasal drip is running down your throat and causing some soreness.   As far as the chest pain- thrilled that has resolved. You have had a good workup and glad you have cardiology follow up  You have a wellness visit next month with our nurse- I am hoping you have a month filled with wellness as you have had enough on your plate this year! dont hesitate to see [place] back if needed though

## 2018-02-17 NOTE — Assessment & Plan Note (Signed)
phq9 <5 on effexor- will continue. Trazodone- takes small dose as well

## 2018-02-17 NOTE — Assessment & Plan Note (Signed)
Reasonable control on vytroin with LDL under 80. If cardiology thinks prior MI then would try to push LDL under 70 though (I suspect they will lean toward attenuation artifact though)

## 2018-02-18 ENCOUNTER — Encounter: Payer: Self-pay | Admitting: Physical Therapy

## 2018-02-18 ENCOUNTER — Ambulatory Visit: Payer: Medicare Other | Admitting: Physical Therapy

## 2018-02-18 DIAGNOSIS — M545 Low back pain: Secondary | ICD-10-CM | POA: Diagnosis not present

## 2018-02-18 DIAGNOSIS — G8929 Other chronic pain: Secondary | ICD-10-CM

## 2018-02-18 DIAGNOSIS — R2681 Unsteadiness on feet: Secondary | ICD-10-CM

## 2018-02-18 DIAGNOSIS — M6281 Muscle weakness (generalized): Secondary | ICD-10-CM

## 2018-02-18 DIAGNOSIS — Z9181 History of falling: Secondary | ICD-10-CM

## 2018-02-18 NOTE — Therapy (Signed)
[facility] [address], [place], [place], [address] Phone: [phone]   Fax:  [phone]  Physical Therapy Treatment  Patient Details  Name: [name] MRN: [id number] Date of Birth: 1936-03-15 Referring Provider: Jovita Gamma, MD   Encounter Date: 02/18/2018  PT End of Session - 02/18/18 1619    Visit Number  15    Number of Visits  16    Date for PT Re-Evaluation  03/04/18    Authorization Type  [employer] Medicare (Progress note completed on 10th visit)    Authorization Time Period  02/02/18 to 03/04/18    Authorization - Visit Number  5    Authorization - Number of Visits  10    PT Start Time  1532    PT Stop Time  1617    PT Time Calculation (min)  45 min    Equipment Utilized During Treatment  Back brace    Activity Tolerance  Patient tolerated treatment well;No increased pain    Behavior During Therapy  WFL for tasks assessed/performed       Past Medical History:  Diagnosis Date  . Anxiety   . Colon polyps   . Complication of anesthesia    takes longer to wake up- admitted overnight after colonoscopy  . Depression   . GERD (gastroesophageal reflux disease)   . Glaucoma   . Headache    migraines  . Hx: UTI (urinary tract infection)   . Hypercholesteremia   . Hypertension   . Lichen simplex chronicus   . Lumbar stenosis with neurogenic claudication   . Macular degeneration   . Osteoarthritis   . Osteopenia    of the hip  . PONV (postoperative nausea and vomiting)   . Stress incontinence, female   . Unsteady gait   . Urinary urgency   . Wears glasses   . Whooping cough    as a baby    Past Surgical History:  Procedure Laterality Date  . ABDOMINAL HYSTERECTOMY     ovaries remain  . ANTERIOR LAT LUMBAR FUSION N/A 10/26/2017   Procedure: LUMBAR THREE- LUMBAR FOUR, LUMBAR FOUR- LUMBAR FIVE ANTEROLATERAL LUMBAR INTERBODY ARTHRODESIS;  Surgeon: Jovita Gamma, MD;  Location: [place];   Service: Neurosurgery;  Laterality: N/A;  LUMBAR 3- LUMBAR 4, LUMBAR 4- LUMBAR 5 ANTEROLATERAL LUMBAR INTERBODY ARTHRODESIS, LUMBAR 3- LUMBAR 4, LUMBAR 4- LUMBAR 5 PERCUTANEOUS PEDICLE SCREW FIXATION  . APPENDECTOMY    . BLADDER REPAIR     x two  . CHOLECYSTECTOMY    . COLONOSCOPY    . EYE SURGERY  11/14, 12/14   Macular Dengeneration, Glaucoma  . EYE SURGERY  2016   left eye  . LESION REMOVAL Right 06/28/2013   Procedure: EXCISION 3 cm right labial sebaceous cyst;  Surgeon: Lyman Speller, MD;  Location: [facility];  Service: Gynecology;  Laterality: Right;  . LUMBAR LAMINECTOMY/DECOMPRESSION MICRODISCECTOMY Right 03/20/2016   Procedure: Right - Lumbar four-five lumbar laminotomy, foraminotomy, and possible microdiscectomy;  Surgeon: Jovita Gamma, MD;  Location: [facility];  Service: Neurosurgery;  Laterality: Right;  right  . LUMBAR PERCUTANEOUS PEDICLE SCREW 2 LEVEL  10/26/2017   Procedure: LUMBAR THREE- LUMBAR FOUR, LUMBAR FOUR- LUMBAR FIVE PERCUTANEOUS PEDICLE SCREW FIXATION;  Surgeon: Jovita Gamma, MD;  Location: [facility];  Service: Neurosurgery;;  . MOHS SURGERY     procedure to remove basal cell  . TONSILLECTOMY AND ADENOIDECTOMY    . URETHRAL SLING  2007  . URETHRAL SLING  1/12   midurethral   . vertebroplasty secondary to traumatic compression fracture      There were no vitals filed for this visit.  Subjective Assessment - 02/18/18 1536    Subjective  Pt reports that things are going well. She is tired because she did alot of walking at her friend's home.     Patient Stated Goals  improve Rt leg strength    Currently in Pain?  No/denies             [facility] Adult PT Treatment/Exercise - 02/18/18 0001      Knee/Hip Exercises: Supine   Straight Leg Raises  Both;1 set;15 reps    Other Supine Knee/Hip Exercises  hip abduction isometric x10 reps each side     Other Supine Knee/Hip Exercises  BLE clamshells x15 reps with green TB       Manual Therapy   Manual  Therapy  Soft tissue mobilization    Soft tissue mobilization  STM lumbar paraspinals and gluteals  Rt          Balance Exercises - 02/18/18 1622      Balance Exercises: Standing   Standing Eyes Opened  Narrow base of support (BOS);Solid surface;1 rep;30 secs    Tandem Stance  Eyes open;3 reps;20 secs;Upper extremity support 1 mirror feedback to improve posture    Sidestepping  1 rep x15 ft each direction        PT Education - 02/18/18 1623    Education provided  Yes    Education Details  therex technique     Person(s) Educated  Patient    Methods  Explanation;Verbal cues    Comprehension  Returned demonstration;Verbalized understanding       PT Short Term Goals - 02/02/18 1136      PT SHORT TERM GOAL #1   Title  verbalize understanding of posture/body mechanics to decrease risk of reinjury    Status  Achieved      PT SHORT TERM GOAL #2   Title  improve functional strength by demonstrating ability to stand without UE support at least 3/5 trials    Status  Achieved      PT SHORT TERM GOAL #3   Title  improve timed up and go to < 20 sec for improved functional mobility    Baseline  9 sec     Period  Weeks    Status  Achieved        PT Long Term Goals - 02/02/18 1136      PT LONG TERM GOAL #1   Title  pt will be independent with an advanced HEP to allow for maintenance of her strength, ROM and endurance after discharge from PT.     Status  On-going      PT LONG TERM GOAL #2   Title  demonstrate improved functional strength by performing 5x STS in < 20 sec withou UE support    Baseline  9 sec    Status  Achieved      PT LONG TERM GOAL #3   Title  improve timed up and go to < 17 sec with LRAD for improved function    Baseline  12 sec    Status  Achieved      PT LONG TERM GOAL #4   Title  amb > 250' with LRAD on various indoor/outdoor surfaces modified independently for improved function    Baseline  using SPC and close supervision    Status  On-going  PT LONG TERM GOAL #5   Title  report pain < 4/10 with activity for improved functional mobility    Baseline  reports not having any pain with daily activity     Status  Achieved      Additional Long Term Goals   Additional Long Term Goals  Yes      PT LONG TERM GOAL #6   Title  Pt will demo improved Rt hip strength to atleast 4/5 MMT which will improve her safety with ambulation and other activity throughout the day.    Time  4    Period  Weeks    Status  New    Target Date  03/04/18            Plan - 02/18/18 1630    Clinical Impression Statement  Pt arrived with excess fatigue following a day of walking and visiting a friend. Due to pt's fatigue, most LE strengthening exercises were completed on the mat table. Attempted balance activity, however pt was unable to successfully complete tandem stance without UE support for fear of her RLE buckling. Noted significant gluteal fatigue during ambulation as well, requiring supervision to CGA when leaving the clinic to ensure safety. Pt noted pain with weight bearing on the RLE, particularly along the lumbar paraspinals, which the therapist addressed with soft tissue mobilization. Will continue to address pt limitations in hip strength, trunk strength and balance to ensure pt is safe with daily activity.     Rehab Potential  Good    PT Frequency  2x / week    PT Duration  4 weeks    PT Treatment/Interventions  ADLs/Self Care Home Management;Cryotherapy;Electrical Stimulation;Moist Heat;Traction;Therapeutic exercise;Therapeutic activities;Functional mobility training;Stair training;Gait training;DME Instruction;Ultrasound;Balance training;Neuromuscular re-education;Patient/family education;Manual techniques;Taping;Dry needling    PT Next Visit Plan  hip abduction/extension strength; balance activity, walking uneven surfaces    PT Home Exercise Plan  Access Code: [email or web address]    Consulted and Agree with Plan of Care  Patient       Patient will  benefit from skilled therapeutic intervention in order to improve the following deficits and impairments:  Abnormal gait, Decreased balance, Decreased cognition, Decreased knowledge of use of DME, Decreased mobility, Decreased safety awareness, Impaired flexibility, Postural dysfunction, Improper body mechanics, Impaired perceived functional ability, Difficulty walking, Increased muscle spasms, Pain, Increased fascial restricitons, Decreased strength, Decreased endurance  Visit Diagnosis: Chronic right-sided low back pain without sciatica  Muscle weakness (generalized)  History of falling  Unsteadiness on feet     Problem List Patient Active Problem List   Diagnosis Date Noted  . Chest pain 01/24/2018  . Liver lesion 11/10/2017  . Nausea & vomiting 10/31/2017  . Chronic daily headache 07/08/2017  . Memory changes 03/06/2017  . Lumbar stenosis with neurogenic claudication 03/20/2016  . Scalp pain 10/22/2015  . Post-traumatic headache 10/11/2015  . Routine health maintenance 03/06/2013  . Macular degeneration of right eye 03/03/2013  . Glaucoma 03/03/2013  . Hyperlipidemia 03/14/2009  . Depression 10/19/2007  . Essential hypertension 10/19/2007  . GASTROESOPHAGEAL REFLUX DISEASE 10/19/2007  . Osteoarthritis 10/19/2007  . STRESS INCONTINENCE 10/19/2007  . Allergic rhinitis 10/19/2007    4:35 PM,02/18/18 Sherol Dade PT, DPT [facility] [address] [place], [place], [address] Phone: [phone]   Fax:  [phone]  Name: [name] MRN: [id number] Date of Birth: 1935/11/08

## 2018-02-23 ENCOUNTER — Encounter: Payer: Self-pay | Admitting: Physical Therapy

## 2018-02-23 ENCOUNTER — Ambulatory Visit: Payer: Medicare Other | Admitting: Physical Therapy

## 2018-02-23 DIAGNOSIS — M545 Low back pain, unspecified: Secondary | ICD-10-CM

## 2018-02-23 DIAGNOSIS — G8929 Other chronic pain: Secondary | ICD-10-CM

## 2018-02-23 DIAGNOSIS — Z9181 History of falling: Secondary | ICD-10-CM

## 2018-02-23 DIAGNOSIS — R2681 Unsteadiness on feet: Secondary | ICD-10-CM

## 2018-02-23 DIAGNOSIS — M6281 Muscle weakness (generalized): Secondary | ICD-10-CM

## 2018-02-23 NOTE — Therapy (Signed)
[facility] [address], [place], [place], [address] Phone: [phone]   Fax:  [phone]  Physical Therapy Treatment  Patient Details  Name: [name] MRN: [id number] Date of Birth: January 14, 1936 Referring Provider: Jovita Gamma, MD   Encounter Date: 02/23/2018  PT End of Session - 02/23/18 1228    Visit Number  16    Number of Visits  16    Date for PT Re-Evaluation  03/04/18    Authorization Type  [employer] Medicare (Progress note completed on 10th visit)    Authorization Time Period  02/02/18 to 03/04/18    Authorization - Visit Number  6    Authorization - Number of Visits  10    PT Start Time  1146    PT Stop Time  1226    PT Time Calculation (min)  40 min    Activity Tolerance  Patient tolerated treatment well;No increased pain    Behavior During Therapy  WFL for tasks assessed/performed       Past Medical History:  Diagnosis Date  . Anxiety   . Colon polyps   . Complication of anesthesia    takes longer to wake up- admitted overnight after colonoscopy  . Depression   . GERD (gastroesophageal reflux disease)   . Glaucoma   . Headache    migraines  . Hx: UTI (urinary tract infection)   . Hypercholesteremia   . Hypertension   . Lichen simplex chronicus   . Lumbar stenosis with neurogenic claudication   . Macular degeneration   . Osteoarthritis   . Osteopenia    of the hip  . PONV (postoperative nausea and vomiting)   . Stress incontinence, female   . Unsteady gait   . Urinary urgency   . Wears glasses   . Whooping cough    as a baby    Past Surgical History:  Procedure Laterality Date  . ABDOMINAL HYSTERECTOMY     ovaries remain  . ANTERIOR LAT LUMBAR FUSION N/A 10/26/2017   Procedure: LUMBAR THREE- LUMBAR FOUR, LUMBAR FOUR- LUMBAR FIVE ANTEROLATERAL LUMBAR INTERBODY ARTHRODESIS;  Surgeon: Jovita Gamma, MD;  Location: [place];  Service: Neurosurgery;  Laterality: N/A;  LUMBAR 3-  LUMBAR 4, LUMBAR 4- LUMBAR 5 ANTEROLATERAL LUMBAR INTERBODY ARTHRODESIS, LUMBAR 3- LUMBAR 4, LUMBAR 4- LUMBAR 5 PERCUTANEOUS PEDICLE SCREW FIXATION  . APPENDECTOMY    . BLADDER REPAIR     x two  . CHOLECYSTECTOMY    . COLONOSCOPY    . EYE SURGERY  11/14, 12/14   Macular Dengeneration, Glaucoma  . EYE SURGERY  2016   left eye  . LESION REMOVAL Right 06/28/2013   Procedure: EXCISION 3 cm right labial sebaceous cyst;  Surgeon: Lyman Speller, MD;  Location: [facility];  Service: Gynecology;  Laterality: Right;  . LUMBAR LAMINECTOMY/DECOMPRESSION MICRODISCECTOMY Right 03/20/2016   Procedure: Right - Lumbar four-five lumbar laminotomy, foraminotomy, and possible microdiscectomy;  Surgeon: Jovita Gamma, MD;  Location: [facility];  Service: Neurosurgery;  Laterality: Right;  right  . LUMBAR PERCUTANEOUS PEDICLE SCREW 2 LEVEL  10/26/2017   Procedure: LUMBAR THREE- LUMBAR FOUR, LUMBAR FOUR- LUMBAR FIVE PERCUTANEOUS PEDICLE SCREW FIXATION;  Surgeon: Jovita Gamma, MD;  Location: [place];  Service: Neurosurgery;;  . MOHS SURGERY     procedure to remove basal cell  . TONSILLECTOMY AND ADENOIDECTOMY    . URETHRAL SLING  2007  . URETHRAL SLING  1/12   midurethral   . vertebroplasty secondary to  traumatic compression fracture      There were no vitals filed for this visit.  Subjective Assessment - 02/23/18 1149    Subjective  Pt reports that she was tired and sore yesterday after doing alot of the laundry. No pain currently after her husband massaged her back.     Patient Stated Goals  improve Rt leg strength    Currently in Pain?  No/denies                       [facility] Adult PT Treatment/Exercise - 02/23/18 0001      Lumbar Exercises: Aerobic   Nustep  L2 x6 min, PT present to discuss progress       Lumbar Exercises: Seated   Sit to Stand  10 reps;Limitations    Sit to Stand Limitations  blue TB around knees and RLE forward       Lumbar Exercises: Supine   Isometric  Hip Flexion  15 reps;2 seconds    Isometric Hip Flexion Limitations  breathing cues provided by therapist      Knee/Hip Exercises: Seated   Clamshell with TheraBand  Blue 2x20 reps       Knee/Hip Exercises: Sidelying   Clams  red TB 2x10 reps each      Manual Therapy   Soft tissue mobilization  STM lumbar paraspinals, Rt glute max              PT Education - 02/23/18 1227    Education provided  Yes    Education Details  technique with therex    Person(s) Educated  Patient    Methods  Explanation;Verbal cues    Comprehension  Verbalized understanding;Returned demonstration       PT Short Term Goals - 02/02/18 1136      PT SHORT TERM GOAL #1   Title  verbalize understanding of posture/body mechanics to decrease risk of reinjury    Status  Achieved      PT SHORT TERM GOAL #2   Title  improve functional strength by demonstrating ability to stand without UE support at least 3/5 trials    Status  Achieved      PT SHORT TERM GOAL #3   Title  improve timed up and go to < 20 sec for improved functional mobility    Baseline  9 sec     Period  Weeks    Status  Achieved        PT Long Term Goals - 02/02/18 1136      PT LONG TERM GOAL #1   Title  pt will be independent with an advanced HEP to allow for maintenance of her strength, ROM and endurance after discharge from PT.     Status  On-going      PT LONG TERM GOAL #2   Title  demonstrate improved functional strength by performing 5x STS in < 20 sec withou UE support    Baseline  9 sec    Status  Achieved      PT LONG TERM GOAL #3   Title  improve timed up and go to < 17 sec with LRAD for improved function    Baseline  12 sec    Status  Achieved      PT LONG TERM GOAL #4   Title  amb > 250' with LRAD on various indoor/outdoor surfaces modified independently for improved function    Baseline  using SPC and close supervision  Status  On-going      PT LONG TERM GOAL #5   Title  report pain < 4/10 with activity  for improved functional mobility    Baseline  reports not having any pain with daily activity     Status  Achieved      Additional Long Term Goals   Additional Long Term Goals  Yes      PT LONG TERM GOAL #6   Title  Pt will demo improved Rt hip strength to atleast 4/5 MMT which will improve her safety with ambulation and other activity throughout the day.    Time  4    Period  Weeks    Status  New    Target Date  03/04/18            Plan - 02/23/18 1228    Clinical Impression Statement  Continued this session with therex to promote hip strength and trunk stability. Pt reported Rt gluteal/low back pain during the session with sit to stand activity and with ambulation. Completed soft tissue mobilization along the lumbar paraspinals and Rt gluteals, and pt ended session with pain free gait exiting the clinic. Will continue to address this muscle spasm and hip/trunk weakness in future sessions.     Rehab Potential  Good    PT Frequency  2x / week    PT Duration  4 weeks    PT Treatment/Interventions  ADLs/Self Care Home Management;Cryotherapy;Electrical Stimulation;Moist Heat;Traction;Therapeutic exercise;Therapeutic activities;Functional mobility training;Stair training;Gait training;DME Instruction;Ultrasound;Balance training;Neuromuscular re-education;Patient/family education;Manual techniques;Taping;Dry needling    PT Next Visit Plan  STM Gluteals/Lumbar spine; hip abduction/extension strength; balance activity, walking uneven surfaces    PT Home Exercise Plan  Access Code: [email or web address]    Consulted and Agree with Plan of Care  Patient       Patient will benefit from skilled therapeutic intervention in order to improve the following deficits and impairments:  Abnormal gait, Decreased balance, Decreased cognition, Decreased knowledge of use of DME, Decreased mobility, Decreased safety awareness, Impaired flexibility, Postural dysfunction, Improper body mechanics, Impaired perceived  functional ability, Difficulty walking, Increased muscle spasms, Pain, Increased fascial restricitons, Decreased strength, Decreased endurance  Visit Diagnosis: Chronic right-sided low back pain without sciatica  Muscle weakness (generalized)  History of falling  Unsteadiness on feet     Problem List Patient Active Problem List   Diagnosis Date Noted  . Chest pain 01/24/2018  . Liver lesion 11/10/2017  . Nausea & vomiting 10/31/2017  . Chronic daily headache 07/08/2017  . Memory changes 03/06/2017  . Lumbar stenosis with neurogenic claudication 03/20/2016  . Scalp pain 10/22/2015  . Post-traumatic headache 10/11/2015  . Routine health maintenance 03/06/2013  . Macular degeneration of right eye 03/03/2013  . Glaucoma 03/03/2013  . Hyperlipidemia 03/14/2009  . Depression 10/19/2007  . Essential hypertension 10/19/2007  . GASTROESOPHAGEAL REFLUX DISEASE 10/19/2007  . Osteoarthritis 10/19/2007  . STRESS INCONTINENCE 10/19/2007  . Allergic rhinitis 10/19/2007    12:46 PM,02/23/18 Sherol Dade PT, DPT [place] [facility]  [facility] [address], [place], [place], [address] Phone: [phone]   Fax:  [phone]  Name: [name] MRN: [id number] Date of Birth: May 09, 1936

## 2018-02-25 ENCOUNTER — Ambulatory Visit: Payer: Medicare Other | Admitting: Physical Therapy

## 2018-02-25 DIAGNOSIS — M6281 Muscle weakness (generalized): Secondary | ICD-10-CM

## 2018-02-25 DIAGNOSIS — R2681 Unsteadiness on feet: Secondary | ICD-10-CM

## 2018-02-25 DIAGNOSIS — M545 Low back pain: Secondary | ICD-10-CM

## 2018-02-25 DIAGNOSIS — Z9181 History of falling: Secondary | ICD-10-CM

## 2018-02-25 DIAGNOSIS — G8929 Other chronic pain: Secondary | ICD-10-CM

## 2018-02-25 NOTE — Therapy (Signed)
[facility] [address], [place], [place], [address] Phone: [phone]   Fax:  [phone]  Physical Therapy Treatment/Re-evaluation  Patient Details  Name: [name] MRN: [id number] Date of Birth: Sep 03, 1935 Referring Provider: Jovita Gamma, MD   Encounter Date: 02/25/2018  PT End of Session - 02/25/18 0847    Visit Number  17    Date for PT Re-Evaluation  04/02/18    Authorization Type  [employer] Medicare (Progress note completed on 10th visit)    Authorization Time Period  02/02/18 to 03/04/18, NEW: 03/05/18 to 04/02/18    Authorization - Visit Number  7    Authorization - Number of Visits  10    PT Start Time  0801    PT Stop Time  0845    PT Time Calculation (min)  44 min    Activity Tolerance  Patient tolerated treatment well;No increased pain    Behavior During Therapy  WFL for tasks assessed/performed       Past Medical History:  Diagnosis Date  . Anxiety   . Colon polyps   . Complication of anesthesia    takes longer to wake up- admitted overnight after colonoscopy  . Depression   . GERD (gastroesophageal reflux disease)   . Glaucoma   . Headache    migraines  . Hx: UTI (urinary tract infection)   . Hypercholesteremia   . Hypertension   . Lichen simplex chronicus   . Lumbar stenosis with neurogenic claudication   . Macular degeneration   . Osteoarthritis   . Osteopenia    of the hip  . PONV (postoperative nausea and vomiting)   . Stress incontinence, female   . Unsteady gait   . Urinary urgency   . Wears glasses   . Whooping cough    as a baby    Past Surgical History:  Procedure Laterality Date  . ABDOMINAL HYSTERECTOMY     ovaries remain  . ANTERIOR LAT LUMBAR FUSION N/A 10/26/2017   Procedure: LUMBAR THREE- LUMBAR FOUR, LUMBAR FOUR- LUMBAR FIVE ANTEROLATERAL LUMBAR INTERBODY ARTHRODESIS;  Surgeon: Jovita Gamma, MD;  Location: [place];  Service: Neurosurgery;  Laterality: N/A;   LUMBAR 3- LUMBAR 4, LUMBAR 4- LUMBAR 5 ANTEROLATERAL LUMBAR INTERBODY ARTHRODESIS, LUMBAR 3- LUMBAR 4, LUMBAR 4- LUMBAR 5 PERCUTANEOUS PEDICLE SCREW FIXATION  . APPENDECTOMY    . BLADDER REPAIR     x two  . CHOLECYSTECTOMY    . COLONOSCOPY    . EYE SURGERY  11/14, 12/14   Macular Dengeneration, Glaucoma  . EYE SURGERY  2016   left eye  . LESION REMOVAL Right 06/28/2013   Procedure: EXCISION 3 cm right labial sebaceous cyst;  Surgeon: Lyman Speller, MD;  Location: [facility];  Service: Gynecology;  Laterality: Right;  . LUMBAR LAMINECTOMY/DECOMPRESSION MICRODISCECTOMY Right 03/20/2016   Procedure: Right - Lumbar four-five lumbar laminotomy, foraminotomy, and possible microdiscectomy;  Surgeon: Jovita Gamma, MD;  Location: [facility];  Service: Neurosurgery;  Laterality: Right;  right  . LUMBAR PERCUTANEOUS PEDICLE SCREW 2 LEVEL  10/26/2017   Procedure: LUMBAR THREE- LUMBAR FOUR, LUMBAR FOUR- LUMBAR FIVE PERCUTANEOUS PEDICLE SCREW FIXATION;  Surgeon: Jovita Gamma, MD;  Location: [facility];  Service: Neurosurgery;;  . MOHS SURGERY     procedure to remove basal cell  . TONSILLECTOMY AND ADENOIDECTOMY    . URETHRAL SLING  2007  . URETHRAL SLING  1/12   midurethral   . vertebroplasty secondary to traumatic compression fracture  There were no vitals filed for this visit.  Subjective Assessment - 02/25/18 0807    Subjective  Pt reports that she felt good after her last session, but this morning her back is a little sore. She has been completing her HEP at home.     Patient Stated Goals  improve Rt leg strength    Currently in Pain?  Yes    Pain Score  6     Pain Location  Back    Pain Orientation  Right;Posterior    Pain Descriptors / Indicators  Sore    Pain Type  Surgical pain    Pain Radiating Towards  none     Pain Onset  More than a month ago    Pain Frequency  Constant    Aggravating Factors   alot of activity     Pain Relieving Factors  rest and icy/hot          [facility] PT Assessment - 02/25/18 0001      Standardized Balance Assessment   Standardized Balance Assessment  Berg Balance Test      Berg Balance Test   Sit to Stand  Able to stand without using hands and stabilize independently    Standing Unsupported  Able to stand safely 2 minutes    Sitting with Back Unsupported but Feet Supported on Floor or Stool  Able to sit safely and securely 2 minutes    Stand to Sit  Sits safely with minimal use of hands    Transfers  Able to transfer safely, minor use of hands    Standing Unsupported with Eyes Closed  Able to stand 10 seconds with supervision    Standing Ubsupported with Feet Together  Able to place feet together independently but unable to hold for 30 seconds    From Standing, Reach Forward with Outstretched Arm  Reaches forward but needs supervision    From Standing Position, Pick up Object from [place] to pick up shoe safely and easily    From Standing Position, Turn to Look Behind Over each Shoulder  Needs assist to keep from losing balance and falling    Turn 360 Degrees  Needs assistance while turning    Standing Unsupported, Alternately Place Feet on Step/Stool  Needs assistance to keep from falling or unable to try    Standing Unsupported, One Foot in Front  Needs help to step but can hold 15 seconds    Standing on One Leg  Tries to lift leg/unable to hold 3 seconds but remains standing independently    Total Score  32                   [facility] Adult PT Treatment/Exercise - 02/25/18 0001      Lumbar Exercises: Aerobic   Nustep  L2 x5 min, PT present to discuss HEP adherence       Lumbar Exercises: Supine   Bridge  15 reps    Bridge Limitations  x2 reps with red TB around knees       Lumbar Exercises: Sidelying   Clam  15 reps red TB, x2 sets       Manual Therapy   Soft tissue mobilization  STM lumbar paraspinals and Rt gluteals              PT Education - 02/25/18 0847    Education provided  Yes     Education Details  results of balance testing and importance of using AD to  decrease risk of falling    Person(s) Educated  Patient    Methods  Explanation    Comprehension  Verbalized understanding       PT Short Term Goals - 02/25/18 1049      PT SHORT TERM GOAL #1   Title  verbalize understanding of posture/body mechanics to decrease risk of reinjury    Status  Achieved      PT SHORT TERM GOAL #2   Title  improve functional strength by demonstrating ability to stand without UE support at least 3/5 trials    Status  Achieved      PT SHORT TERM GOAL #3   Title  improve timed up and go to < 20 sec for improved functional mobility    Baseline  9 sec     Period  Weeks    Status  Achieved        PT Long Term Goals - 02/25/18 1049      PT LONG TERM GOAL #1   Title  pt will be independent with an advanced HEP to allow for maintenance of her strength, ROM and endurance after discharge from PT.     Status  On-going      PT LONG TERM GOAL #2   Title  demonstrate improved functional strength by performing 5x STS in < 20 sec withou UE support    Baseline  9 sec    Status  Achieved      PT LONG TERM GOAL #3   Title  Pt will have an improvement of atleast 6 points on the BERG balance test, to reflect a clinically significant improvement in her balance and decrease her risk of falling and causing injury.     Baseline  32/56    Time  4    Period  Weeks    Status  Revised    Target Date  04/02/18      PT LONG TERM GOAL #4   Title  amb > 250' with LRAD on various indoor/outdoor surfaces modified independently for improved function    Baseline  using SPC and close supervision    Status  On-going      PT LONG TERM GOAL #5   Title  report pain < 4/10 with activity for improved functional mobility    Baseline  reports not having any pain with daily activity     Status  Achieved      PT LONG TERM GOAL #6   Title  Pt will demo improved Rt hip strength to atleast 4/5 MMT which  will improve her safety with ambulation and other activity throughout the day.    Time  4    Period  Weeks    Status  New    Target Date  04/02/18            Plan - 02/25/18 0856    Clinical Impression Statement  Pt arrived with higher levels of Rt low back and buttock pain, although she reports good relief following last session. End of session this resolved following soft tissue mobilization to the area. Recently, the pt has been ambulating without her [facility] and often demonstrates unsteadiness ambulating throughout the clinic requiring therapist supervision and assistance to prevent LOB. Completed Berg Balance test this visit, with a score of 32 out of 56, placing her at a significant/high risk of falling. Therapist discussed this with the pt and reviewed ways to decrease her risk of fall, including using an AD. Pt verbalized  understanding and agreement with this. Although her strength and flexibility has improved since her evaluation, the pt's balance is largely impacted by her remaining limitations in strength of the hip and abdominals. She has met all of her short term goals and several of her long term goals, but would benefit from an extension of her PT POC to allow for further skilled intervention to decrease muscle spasm of the back/hip, increase RLE strength, improve trunk strength/stability and improve her static and dynamic balance to decrease her risk of falling at home or in the community.     Rehab Potential  Good    PT Frequency  2x / week    PT Duration  4 weeks    PT Treatment/Interventions  ADLs/Self Care Home Management;Cryotherapy;Electrical Stimulation;Moist Heat;Traction;Therapeutic exercise;Therapeutic activities;Functional mobility training;Stair training;Gait training;DME Instruction;Ultrasound;Balance training;Neuromuscular re-education;Patient/family education;Manual techniques;Taping;Dry needling    PT Next Visit Plan  STM Gluteals/Lumbar spine as needed; hip  abduction/extension strength; balance activity, walking uneven surfaces    PT Home Exercise Plan  Access Code: [email or web address]    Consulted and Agree with Plan of Care  Patient       Patient will benefit from skilled therapeutic intervention in order to improve the following deficits and impairments:  Abnormal gait, Decreased balance, Decreased cognition, Decreased knowledge of use of DME, Decreased mobility, Decreased safety awareness, Impaired flexibility, Postural dysfunction, Improper body mechanics, Impaired perceived functional ability, Difficulty walking, Increased muscle spasms, Pain, Increased fascial restricitons, Decreased strength, Decreased endurance  Visit Diagnosis: Unsteadiness on feet  History of falling  Muscle weakness (generalized)  Chronic right-sided low back pain without sciatica     Problem List Patient Active Problem List   Diagnosis Date Noted  . Chest pain 01/24/2018  . Liver lesion 11/10/2017  . Nausea & vomiting 10/31/2017  . Chronic daily headache 07/08/2017  . Memory changes 03/06/2017  . Lumbar stenosis with neurogenic claudication 03/20/2016  . Scalp pain 10/22/2015  . Post-traumatic headache 10/11/2015  . Routine health maintenance 03/06/2013  . Macular degeneration of right eye 03/03/2013  . Glaucoma 03/03/2013  . Hyperlipidemia 03/14/2009  . Depression 10/19/2007  . Essential hypertension 10/19/2007  . GASTROESOPHAGEAL REFLUX DISEASE 10/19/2007  . Osteoarthritis 10/19/2007  . STRESS INCONTINENCE 10/19/2007  . Allergic rhinitis 10/19/2007    10:59 AM,02/25/18 Sherol Dade PT, DPT [place] [facility] [address], [place], [place], [address] Phone: [phone]   Fax:  [phone]  Name: [name] MRN: [id number] Date of Birth: 11-04-1935

## 2018-03-02 ENCOUNTER — Ambulatory Visit: Payer: Medicare Other | Admitting: Physical Therapy

## 2018-03-02 DIAGNOSIS — Z9181 History of falling: Secondary | ICD-10-CM

## 2018-03-02 DIAGNOSIS — R2681 Unsteadiness on feet: Secondary | ICD-10-CM

## 2018-03-02 DIAGNOSIS — M6281 Muscle weakness (generalized): Secondary | ICD-10-CM

## 2018-03-02 DIAGNOSIS — M545 Low back pain: Secondary | ICD-10-CM | POA: Diagnosis not present

## 2018-03-02 DIAGNOSIS — G8929 Other chronic pain: Secondary | ICD-10-CM

## 2018-03-02 NOTE — Therapy (Signed)
[facility] [address], [place], [place], [address] Phone: [phone]   Fax:  [phone]  Physical Therapy Treatment  Patient Details  Name: [name] MRN: [id number] Date of Birth: 21-Jun-1936 Referring Provider: Jovita Gamma, MD   Encounter Date: 03/02/2018  PT End of Session - 03/02/18 1240    Visit Number  18    Date for PT Re-Evaluation  04/02/18    Authorization Type  [employer] Medicare (Progress note completed on 10th visit)    Authorization Time Period  02/02/18 to 03/04/18, NEW: 03/05/18 to 04/02/18    Authorization - Visit Number  7    Authorization - Number of Visits  10    PT Start Time  1150    PT Stop Time  1231    PT Time Calculation (min)  41 min    Activity Tolerance  Patient tolerated treatment well;No increased pain    Behavior During Therapy  WFL for tasks assessed/performed       Past Medical History:  Diagnosis Date  . Anxiety   . Colon polyps   . Complication of anesthesia    takes longer to wake up- admitted overnight after colonoscopy  . Depression   . GERD (gastroesophageal reflux disease)   . Glaucoma   . Headache    migraines  . Hx: UTI (urinary tract infection)   . Hypercholesteremia   . Hypertension   . Lichen simplex chronicus   . Lumbar stenosis with neurogenic claudication   . Macular degeneration   . Osteoarthritis   . Osteopenia    of the hip  . PONV (postoperative nausea and vomiting)   . Stress incontinence, female   . Unsteady gait   . Urinary urgency   . Wears glasses   . Whooping cough    as a baby    Past Surgical History:  Procedure Laterality Date  . ABDOMINAL HYSTERECTOMY     ovaries remain  . ANTERIOR LAT LUMBAR FUSION N/A 10/26/2017   Procedure: LUMBAR THREE- LUMBAR FOUR, LUMBAR FOUR- LUMBAR FIVE ANTEROLATERAL LUMBAR INTERBODY ARTHRODESIS;  Surgeon: Jovita Gamma, MD;  Location: [facility];  Service: Neurosurgery;  Laterality: N/A;  LUMBAR 3-  LUMBAR 4, LUMBAR 4- LUMBAR 5 ANTEROLATERAL LUMBAR INTERBODY ARTHRODESIS, LUMBAR 3- LUMBAR 4, LUMBAR 4- LUMBAR 5 PERCUTANEOUS PEDICLE SCREW FIXATION  . APPENDECTOMY    . BLADDER REPAIR     x two  . CHOLECYSTECTOMY    . COLONOSCOPY    . EYE SURGERY  11/14, 12/14   Macular Dengeneration, Glaucoma  . EYE SURGERY  2016   left eye  . LESION REMOVAL Right 06/28/2013   Procedure: EXCISION 3 cm right labial sebaceous cyst;  Surgeon: Lyman Speller, MD;  Location: [facility];  Service: Gynecology;  Laterality: Right;  . LUMBAR LAMINECTOMY/DECOMPRESSION MICRODISCECTOMY Right 03/20/2016   Procedure: Right - Lumbar four-five lumbar laminotomy, foraminotomy, and possible microdiscectomy;  Surgeon: Jovita Gamma, MD;  Location: [facility];  Service: Neurosurgery;  Laterality: Right;  right  . LUMBAR PERCUTANEOUS PEDICLE SCREW 2 LEVEL  10/26/2017   Procedure: LUMBAR THREE- LUMBAR FOUR, LUMBAR FOUR- LUMBAR FIVE PERCUTANEOUS PEDICLE SCREW FIXATION;  Surgeon: Jovita Gamma, MD;  Location: [facility];  Service: Neurosurgery;;  . MOHS SURGERY     procedure to remove basal cell  . TONSILLECTOMY AND ADENOIDECTOMY    . URETHRAL SLING  2007  . URETHRAL SLING  1/12   midurethral   . vertebroplasty secondary to traumatic compression fracture  There were no vitals filed for this visit.                    [facility] Adult PT Treatment/Exercise - 03/02/18 0001      Lumbar Exercises: Seated   Sit to Stand  10 reps    Other Seated Lumbar Exercises  BUE rows with red TB x20 reps       Lumbar Exercises: Supine   Pelvic Tilt  10 reps    Straight Leg Raise  15 reps;Limitations    Straight Leg Raises Limitations  active assistance from PT on the Rt     Isometric Hip Flexion  2 seconds;15 reps    Isometric Hip Flexion Limitations  with abdominal brace      Knee/Hip Exercises: Seated   Other Seated Knee/Hip Exercises  B ankle eversion with yellow TB x10 reps; Lt and Rt ankle PF with yellow TB x15  reps each       Manual Therapy   Soft tissue mobilization  STM lumbar paraspinals, gluteals              PT Education - 03/02/18 1239    Education provided  Yes    Education Details  importance of using RW for improved safety     Person(s) Educated  Patient    Methods  Explanation    Comprehension  Verbalized understanding       PT Short Term Goals - 02/25/18 1049      PT SHORT TERM GOAL #1   Title  verbalize understanding of posture/body mechanics to decrease risk of reinjury    Status  Achieved      PT SHORT TERM GOAL #2   Title  improve functional strength by demonstrating ability to stand without UE support at least 3/5 trials    Status  Achieved      PT SHORT TERM GOAL #3   Title  improve timed up and go to < 20 sec for improved functional mobility    Baseline  9 sec     Period  Weeks    Status  Achieved        PT Long Term Goals - 02/25/18 1049      PT LONG TERM GOAL #1   Title  pt will be independent with an advanced HEP to allow for maintenance of her strength, ROM and endurance after discharge from PT.     Status  On-going      PT LONG TERM GOAL #2   Title  demonstrate improved functional strength by performing 5x STS in < 20 sec withou UE support    Baseline  9 sec    Status  Achieved      PT LONG TERM GOAL #3   Title  Pt will have an improvement of atleast 6 points on the BERG balance test, to reflect a clinically significant improvement in her balance and decrease her risk of falling and causing injury.     Baseline  32/56    Time  4    Period  Weeks    Status  Revised    Target Date  04/02/18      PT LONG TERM GOAL #4   Title  amb > 250' with LRAD on various indoor/outdoor surfaces modified independently for improved function    Baseline  using SPC and close supervision    Status  On-going      PT LONG TERM GOAL #5   Title  report  pain < 4/10 with activity for improved functional mobility    Baseline  reports not having any pain with  daily activity     Status  Achieved      PT LONG TERM GOAL #6   Title  Pt will demo improved Rt hip strength to atleast 4/5 MMT which will improve her safety with ambulation and other activity throughout the day.    Time  4    Period  Weeks    Status  New    Target Date  04/02/18            Plan - 03/02/18 1240    Clinical Impression Statement  Today's session continued with therex to address LE strength and abdominal strength deficits. Pt arrived with her [facility], requiring close supervision to improve her safety. She would benefit from using her RW and therapist reiterated this to ensure that she is at a decreased risk of falling. During today's session, pt noted increase in Rt low back pain, which was gone after soft tissue mobilization treatment to the area.     Rehab Potential  Good    PT Frequency  2x / week    PT Duration  4 weeks    PT Treatment/Interventions  ADLs/Self Care Home Management;Cryotherapy;Electrical Stimulation;Moist Heat;Traction;Therapeutic exercise;Therapeutic activities;Functional mobility training;Stair training;Gait training;DME Instruction;Ultrasound;Balance training;Neuromuscular re-education;Patient/family education;Manual techniques;Taping;Dry needling    PT Next Visit Plan  STM Gluteals/Lumbar spine as needed; hip abduction/extension strength; balance activity, walking uneven surfaces    PT Home Exercise Plan  Access Code: [email or web address]    Consulted and Agree with Plan of Care  Patient       Patient will benefit from skilled therapeutic intervention in order to improve the following deficits and impairments:  Abnormal gait, Decreased balance, Decreased cognition, Decreased knowledge of use of DME, Decreased mobility, Decreased safety awareness, Impaired flexibility, Postural dysfunction, Improper body mechanics, Impaired perceived functional ability, Difficulty walking, Increased muscle spasms, Pain, Increased fascial restricitons, Decreased strength, Decreased  endurance  Visit Diagnosis: Unsteadiness on feet  History of falling  Muscle weakness (generalized)  Chronic right-sided low back pain without sciatica     Problem List Patient Active Problem List   Diagnosis Date Noted  . Chest pain 01/24/2018  . Liver lesion 11/10/2017  . Nausea & vomiting 10/31/2017  . Chronic daily headache 07/08/2017  . Memory changes 03/06/2017  . Lumbar stenosis with neurogenic claudication 03/20/2016  . Scalp pain 10/22/2015  . Post-traumatic headache 10/11/2015  . Routine health maintenance 03/06/2013  . Macular degeneration of right eye 03/03/2013  . Glaucoma 03/03/2013  . Hyperlipidemia 03/14/2009  . Depression 10/19/2007  . Essential hypertension 10/19/2007  . GASTROESOPHAGEAL REFLUX DISEASE 10/19/2007  . Osteoarthritis 10/19/2007  . STRESS INCONTINENCE 10/19/2007  . Allergic rhinitis 10/19/2007    1:01 PM,03/02/18 Sherol Dade PT, DPT [place] [facility] [address], [place], [place], [address] Phone: [phone]   Fax:  [phone]  Name: [name] MRN: [id number] Date of Birth: 1936-08-05

## 2018-03-03 ENCOUNTER — Telehealth: Payer: Self-pay | Admitting: Family Medicine

## 2018-03-03 NOTE — Telephone Encounter (Signed)
See note

## 2018-03-03 NOTE — Telephone Encounter (Signed)
Patient scheduled tomorrow for 1:00pm

## 2018-03-03 NOTE — Telephone Encounter (Signed)
Copied from [place] [id number]. Topic: Quick Communication - See Telephone Encounter >> Mar 03, 2018 11:04 AM Rutherford Nail, NT wrote: CRM for notification. See Telephone encounter for: 03/03/18. Patient calling and states that she was supposed to get her ears irrigated at her last visit with Dr Retail banker, but the person he wanted to use was not in the office. Would like to get something scheduled with who Dr Yong Channel prefers to use due to sounding like she is in a tunnel. Please advise. CB#: [phone]

## 2018-03-04 ENCOUNTER — Encounter: Payer: Self-pay | Admitting: Family Medicine

## 2018-03-04 ENCOUNTER — Ambulatory Visit: Payer: Medicare Other | Admitting: Physical Therapy

## 2018-03-04 ENCOUNTER — Ambulatory Visit: Payer: Medicare Other | Admitting: Family Medicine

## 2018-03-04 ENCOUNTER — Encounter: Payer: Self-pay | Admitting: Physical Therapy

## 2018-03-04 VITALS — BP 112/74 | HR 69 | Temp 98.1°F | Ht 60.0 in | Wt 164.0 lb

## 2018-03-04 DIAGNOSIS — M545 Low back pain: Secondary | ICD-10-CM

## 2018-03-04 DIAGNOSIS — Z9181 History of falling: Secondary | ICD-10-CM

## 2018-03-04 DIAGNOSIS — H6121 Impacted cerumen, right ear: Secondary | ICD-10-CM | POA: Diagnosis not present

## 2018-03-04 DIAGNOSIS — G8929 Other chronic pain: Secondary | ICD-10-CM

## 2018-03-04 DIAGNOSIS — M6281 Muscle weakness (generalized): Secondary | ICD-10-CM

## 2018-03-04 DIAGNOSIS — R2681 Unsteadiness on feet: Secondary | ICD-10-CM

## 2018-03-04 NOTE — Therapy (Signed)
[facility] [address] [place], [place], [address] Phone: [phone]   Fax:  [phone]  Physical Therapy Treatment  Patient Details  Name: [name] MRN: [id number] Date of Birth: [birth year] Referring Provider: Jovita Gamma, MD   Encounter Date: 03/04/2018  PT End of Session - 03/04/18 1228    Visit Number  19    Date for PT Re-Evaluation  04/02/18    Authorization Type  [employer] Medicare (Progress note completed on 10th visit)    Authorization Time Period  02/02/18 to 03/04/18, NEW: 03/05/18 to 04/02/18    Authorization - Visit Number  3    Authorization - Number of Visits  10    PT Start Time  9179    PT Stop Time  1228    PT Time Calculation (min)  41 min    Activity Tolerance  Patient tolerated treatment well;No increased pain    Behavior During Therapy  WFL for tasks assessed/performed       Past Medical History:  Diagnosis Date  . Anxiety   . Colon polyps   . Complication of anesthesia    takes longer to wake up- admitted overnight after colonoscopy  . Depression   . GERD (gastroesophageal reflux disease)   . Glaucoma   . Headache    migraines  . Hx: UTI (urinary tract infection)   . Hypercholesteremia   . Hypertension   . Lichen simplex chronicus   . Lumbar stenosis with neurogenic claudication   . Macular degeneration   . Osteoarthritis   . Osteopenia    of the hip  . PONV (postoperative nausea and vomiting)   . Stress incontinence, female   . Unsteady gait   . Urinary urgency   . Wears glasses   . Whooping cough    as a baby    Past Surgical History:  Procedure Laterality Date  . ABDOMINAL HYSTERECTOMY     ovaries remain  . ANTERIOR LAT LUMBAR FUSION N/A 10/26/2017   Procedure: LUMBAR THREE- LUMBAR FOUR, LUMBAR FOUR- LUMBAR FIVE ANTEROLATERAL LUMBAR INTERBODY ARTHRODESIS;  Surgeon: Jovita Gamma, MD;  Location: [facility];  Service: Neurosurgery;  Laterality: N/A;  LUMBAR 3-  LUMBAR 4, LUMBAR 4- LUMBAR 5 ANTEROLATERAL LUMBAR INTERBODY ARTHRODESIS, LUMBAR 3- LUMBAR 4, LUMBAR 4- LUMBAR 5 PERCUTANEOUS PEDICLE SCREW FIXATION  . APPENDECTOMY    . BLADDER REPAIR     x two  . CHOLECYSTECTOMY    . COLONOSCOPY    . EYE SURGERY  11/14, 12/14   Macular Dengeneration, Glaucoma  . EYE SURGERY  2016   left eye  . LESION REMOVAL Right 06/28/2013   Procedure: EXCISION 3 cm right labial sebaceous cyst;  Surgeon: Lyman Speller, MD;  Location: [facility];  Service: Gynecology;  Laterality: Right;  . LUMBAR LAMINECTOMY/DECOMPRESSION MICRODISCECTOMY Right 03/20/2016   Procedure: Right - Lumbar four-five lumbar laminotomy, foraminotomy, and possible microdiscectomy;  Surgeon: Jovita Gamma, MD;  Location: [facility];  Service: Neurosurgery;  Laterality: Right;  right  . LUMBAR PERCUTANEOUS PEDICLE SCREW 2 LEVEL  10/26/2017   Procedure: LUMBAR THREE- LUMBAR FOUR, LUMBAR FOUR- LUMBAR FIVE PERCUTANEOUS PEDICLE SCREW FIXATION;  Surgeon: Jovita Gamma, MD;  Location: [facility];  Service: Neurosurgery;;  . MOHS SURGERY     procedure to remove basal cell  . TONSILLECTOMY AND ADENOIDECTOMY    . URETHRAL SLING  2007  . URETHRAL SLING  1/12   midurethral   . vertebroplasty secondary to traumatic compression fracture  There were no vitals filed for this visit.  Subjective Assessment - 03/04/18 1148    Subjective  Pt reports that things are going well. She is using her walker now.     Patient Stated Goals  improve Rt leg strength    Currently in Pain?  No/denies    Pain Onset  More than a month ago                       [facility] Adult PT Treatment/Exercise - 03/04/18 0001      Lumbar Exercises: Supine   Straight Leg Raise  10 reps    Straight Leg Raises Limitations  x2 sets with abdominal brace    Other Supine Lumbar Exercises  BLE alternating heel tap from 90/90 position x5 reps each       Knee/Hip Exercises: Standing   Hip Extension  Both;1 set;10 reps;Knee  straight    Extension Limitations  yellow TB    Other Standing Knee Exercises  sidestepping Lt/Rt along countertop with yellow TB around ankles       Knee/Hip Exercises: Seated   Clamshell with TheraBand  Blue 2x10 reps each      Knee/Hip Exercises: Sidelying   Hip ABduction  Right;1 set;10 reps      Manual Therapy   Myofascial Release  trigger point release Rt piriformis              PT Education - 03/04/18 1228    Education provided  Yes    Education Details  technique with therex; updated and discussed HEP    Person(s) Educated  Patient    Methods  Explanation;Verbal cues;Handout;Tactile cues    Comprehension  Returned demonstration;Verbalized understanding       PT Short Term Goals - 02/25/18 1049      PT SHORT TERM GOAL #1   Title  verbalize understanding of posture/body mechanics to decrease risk of reinjury    Status  Achieved      PT SHORT TERM GOAL #2   Title  improve functional strength by demonstrating ability to stand without UE support at least 3/5 trials    Status  Achieved      PT SHORT TERM GOAL #3   Title  improve timed up and go to < 20 sec for improved functional mobility    Baseline  9 sec     Period  Weeks    Status  Achieved        PT Long Term Goals - 02/25/18 1049      PT LONG TERM GOAL #1   Title  pt will be independent with an advanced HEP to allow for maintenance of her strength, ROM and endurance after discharge from PT.     Status  On-going      PT LONG TERM GOAL #2   Title  demonstrate improved functional strength by performing 5x STS in < 20 sec withou UE support    Baseline  9 sec    Status  Achieved      PT LONG TERM GOAL #3   Title  Pt will have an improvement of atleast 6 points on the BERG balance test, to reflect a clinically significant improvement in her balance and decrease her risk of falling and causing injury.     Baseline  32/56    Time  4    Period  Weeks    Status  Revised    Target Date  04/02/18  PT LONG TERM GOAL #4   Title  amb > 250' with LRAD on various indoor/outdoor surfaces modified independently for improved function    Baseline  using SPC and close supervision    Status  On-going      PT LONG TERM GOAL #5   Title  report pain < 4/10 with activity for improved functional mobility    Baseline  reports not having any pain with daily activity     Status  Achieved      PT LONG TERM GOAL #6   Title  Pt will demo improved Rt hip strength to atleast 4/5 MMT which will improve her safety with ambulation and other activity throughout the day.    Time  4    Period  Weeks    Status  New    Target Date  04/02/18            Plan - 03/04/18 1229    Clinical Impression Statement  Pt arrived with her RW, ambulating with improved stability. Therapist adjusted RW height to ensure that she is maintaining upright posture. Session focused on therex to improve abdominal and LE strength, and therapist provided an updated HEP with increased resistance bands to reflect improvements in strength. Pt does have pain with Rt hip abduction in sidelying which was improved following myofascial release to the Rt gluteal. Will continue with current POC.     Rehab Potential  Good    PT Frequency  2x / week    PT Duration  4 weeks    PT Treatment/Interventions  ADLs/Self Care Home Management;Cryotherapy;Electrical Stimulation;Moist Heat;Traction;Therapeutic exercise;Therapeutic activities;Functional mobility training;Stair training;Gait training;DME Instruction;Ultrasound;Balance training;Neuromuscular re-education;Patient/family education;Manual techniques;Taping;Dry needling    PT Next Visit Plan  f/u on HEP adherence; sidelying hip abduction and sidelying clam (assess pain response); supine abdominal strengthening; balance activity    PT Home Exercise Plan  Access Code: [email or web address]    Consulted and Agree with Plan of Care  Patient       Patient will benefit from skilled therapeutic intervention in  order to improve the following deficits and impairments:  Abnormal gait, Decreased balance, Decreased cognition, Decreased knowledge of use of DME, Decreased mobility, Decreased safety awareness, Impaired flexibility, Postural dysfunction, Improper body mechanics, Impaired perceived functional ability, Difficulty walking, Increased muscle spasms, Pain, Increased fascial restricitons, Decreased strength, Decreased endurance  Visit Diagnosis: Unsteadiness on feet  History of falling  Muscle weakness (generalized)  Chronic right-sided low back pain without sciatica     Problem List Patient Active Problem List   Diagnosis Date Noted  . Chest pain 01/24/2018  . Liver lesion 11/10/2017  . Nausea & vomiting 10/31/2017  . Chronic daily headache 07/08/2017  . Memory changes 03/06/2017  . Lumbar stenosis with neurogenic claudication 03/20/2016  . Scalp pain 10/22/2015  . Post-traumatic headache 10/11/2015  . Routine health maintenance 03/06/2013  . Macular degeneration of right eye 03/03/2013  . Glaucoma 03/03/2013  . Hyperlipidemia 03/14/2009  . Depression 10/19/2007  . Essential hypertension 10/19/2007  . GASTROESOPHAGEAL REFLUX DISEASE 10/19/2007  . Osteoarthritis 10/19/2007  . STRESS INCONTINENCE 10/19/2007  . Allergic rhinitis 10/19/2007    12:34 PM,03/04/18 Sherol Dade PT, DPT [facility] [address], [place], [place], [address] Phone: [phone]   Fax:  [phone]  Name: [name] MRN: [id number] Date of Birth: 11/26/1935

## 2018-03-04 NOTE — Progress Notes (Signed)
Subjective:  [name] is a 82 y.o. year old very pleasant female patient who presents for/with See problem oriented charting ROS- hearing loss which resolved after irrigation. No chest pain or shortness of breath    Past Medical History-  Patient Active Problem List   Diagnosis Date Noted  . Liver lesion 11/10/2017    Priority: High  . Memory changes 03/06/2017    Priority: Medium  . Lumbar stenosis with neurogenic claudication 03/20/2016    Priority: Medium  . Scalp pain 10/22/2015    Priority: Medium  . Macular degeneration of right eye 03/03/2013    Priority: Medium  . Glaucoma 03/03/2013    Priority: Medium  . Hyperlipidemia 03/14/2009    Priority: Medium  . Depression 10/19/2007    Priority: Medium  . Essential hypertension 10/19/2007    Priority: Medium  . GASTROESOPHAGEAL REFLUX DISEASE 10/19/2007    Priority: Medium  . Chest pain 01/24/2018    Priority: Low  . Post-traumatic headache 10/11/2015    Priority: Low  . Routine health maintenance 03/06/2013    Priority: Low  . Osteoarthritis 10/19/2007    Priority: Low  . STRESS INCONTINENCE 10/19/2007    Priority: Low  . Allergic rhinitis 10/19/2007    Priority: Low  . Nausea & vomiting 10/31/2017  . Chronic daily headache 07/08/2017    Medications- reviewed and updated Current Outpatient Medications  Medication Sig Dispense Refill  . acetaminophen (TYLENOL) 500 MG tablet Take 500 mg by mouth daily as needed for mild pain or headache.     . ALPRAZolam (XANAX) 0.5 MG tablet Take 0.5-1 tablets (0.25-0.5 mg total) by mouth 2 (two) times daily as needed. for anxiety 20 tablet 0  . azelastine (ASTELIN) 0.1 % nasal spray Place 1 spray into both nostrils 2 (two) times daily. Use in each nostril as directed 30 mL 5  . brimonidine-timolol (COMBIGAN) 0.2-0.5 % ophthalmic solution Place 1 drop into both eyes every 12 (twelve) hours.    Marland Kitchen ezetimibe-simvastatin (VYTORIN) 10-20 MG tablet Take 1 tablet by mouth daily. 90  tablet 3  . ibuprofen (ADVIL,MOTRIN) 200 MG tablet Take 200 mg by mouth every 8 (eight) hours as needed.    Marland Kitchen ketoconazole (NIZORAL) 2 % cream Apply 1 application topically daily. For circular area on left lower leg 30 g 0  . nadolol (CORGARD) 40 MG tablet Take 1 tablet (40 mg total) by mouth at bedtime. 90 tablet 1  . omeprazole (PRILOSEC) 20 MG capsule Take 20 mg by mouth daily before breakfast.    . sodium chloride (OCEAN) 0.65 % SOLN nasal spray Place 1 spray into both nostrils 2 (two) times daily as needed for congestion.    . traZODone (DESYREL) 100 MG tablet TAKE 1/4 TO 1/2 TABLET BY MOUTH AT BEDTIME IF NEEDED FOR SLEEP 45 tablet 1  . triamcinolone ointment (KENALOG) 0.5 % Apply 1 application topically 2 (two) times daily. 30 g 0  . venlafaxine XR (EFFEXOR-XR) 150 MG 24 hr capsule TAKE 1 CAPSULE BY MOUTH TWICE A DAY 60 capsule 5   No current facility-administered medications for this visit.     Objective: BP 112/74 (BP Location: Left Arm, Patient Position: Sitting, Cuff Size: Large)   Pulse 69   Temp 98.1 F (36.7 C) (Oral)   Ht 5' (1.524 m)   Wt 164 lb (74.4 kg)   LMP 08/11/1974   SpO2 97%   BMI 32.03 kg/m  Gen: NAD, resting comfortably TM obstructed on the right side prior to  irrigation as was left TM, normal full view after irrigation CV: RRR no murmurs rubs or gallops Lungs: CTAB no crackles, wheeze, rhonchi  Procedure note: Cerumen noted in right ear.  Irrigation with water and peroxide performed. Full view of tympanic membrane after procedure.  Patient tolerated procedure well  Assessment/Plan:   Impacted cerumen of right ear S: loss of hearing for several days. Right ear feels full. Several days of issues. Getting worse A/P: Irrigation completed. Complete resolution of hearing loss after this and resolution of ear fullness sensation.   Future Appointments  Date Time Provider [place]  03/09/2018 11:45 AM Sherol Dade, PT [facility]  03/10/2018   9:30 AM Imogene Burn, PA-C [facility]  03/11/2018 11:45 AM Sherol Dade, PT [facility]  03/16/2018 12:30 PM Sherol Dade, PT [facility]  03/18/2018 11:45 AM Sherol Dade, PT [facility]  03/23/2018 11:45 AM Sherol Dade, PT [facility]  03/25/2018 11:45 AM Sherol Dade, PT [facility]  03/30/2018 11:45 AM Sherol Dade, PT [facility]  04/01/2018 11:45 AM Sherol Dade, PT [facility]  04/01/2018  2:00 PM [facility]  05/14/2018  9:30 AM Megan Salon, MD [facility]   Return precautions advised.  Garret Reddish, MD

## 2018-03-04 NOTE — Patient Instructions (Signed)
Access Code: [email or web address]  URL: [email or web address]  Date: 03/04/2018  Prepared by: Elly Modena   Exercises  Hooklying Isometric Hip Flexion - 10 reps - 2x daily - 7x weekly  Bridge with Resistance - 15 reps - 1 sets - 2 sec hold - 2x daily - 7x weekly  Seated Hip Abduction - 10 reps - 2 sets - 1x daily - 7x weekly  Standing Hip Extension - 10 reps - 2 sets - 2x daily - 7x weekly  Standing Heel Raise with Support - 20 reps - 1 sets - 2x daily - 7x weekly    [facility] [address], [place], [place] [address] Phone # [phone] Fax [phone]

## 2018-03-05 ENCOUNTER — Ambulatory Visit: Payer: Medicare Other | Admitting: Family Medicine

## 2018-03-09 ENCOUNTER — Ambulatory Visit: Payer: Medicare Other | Admitting: Physical Therapy

## 2018-03-09 ENCOUNTER — Encounter: Payer: Self-pay | Admitting: Physical Therapy

## 2018-03-09 DIAGNOSIS — M6281 Muscle weakness (generalized): Secondary | ICD-10-CM

## 2018-03-09 DIAGNOSIS — Z9181 History of falling: Secondary | ICD-10-CM

## 2018-03-09 DIAGNOSIS — M545 Low back pain: Secondary | ICD-10-CM | POA: Diagnosis not present

## 2018-03-09 DIAGNOSIS — R2681 Unsteadiness on feet: Secondary | ICD-10-CM

## 2018-03-09 NOTE — Therapy (Signed)
[facility] [address] [place], [place], [address] Phone: [phone]   Fax:  [phone]  Physical Therapy Treatment  Patient Details  Name: [name] MRN: [id number] Date of Birth: 11/11/1935 Referring Provider: Jovita Gamma, MD   Encounter Date: 03/09/2018  PT End of Session - 03/09/18 1226    Visit Number  20    Date for PT Re-Evaluation  04/02/18    Authorization Type  [employer] Medicare     Authorization Time Period  02/02/18 to 03/04/18, NEW: 03/05/18 to 04/02/18    Authorization - Visit Number  4    Authorization - Number of Visits  10    PT Start Time  1146    PT Stop Time  1230    PT Time Calculation (min)  44 min    Activity Tolerance  Patient tolerated treatment well;No increased pain    Behavior During Therapy  WFL for tasks assessed/performed       Past Medical History:  Diagnosis Date  . Anxiety   . Colon polyps   . Complication of anesthesia    takes longer to wake up- admitted overnight after colonoscopy  . Depression   . GERD (gastroesophageal reflux disease)   . Glaucoma   . Headache    migraines  . Hx: UTI (urinary tract infection)   . Hypercholesteremia   . Hypertension   . Lichen simplex chronicus   . Lumbar stenosis with neurogenic claudication   . Macular degeneration   . Osteoarthritis   . Osteopenia    of the hip  . PONV (postoperative nausea and vomiting)   . Stress incontinence, female   . Unsteady gait   . Urinary urgency   . Wears glasses   . Whooping cough    as a baby    Past Surgical History:  Procedure Laterality Date  . ABDOMINAL HYSTERECTOMY     ovaries remain  . ANTERIOR LAT LUMBAR FUSION N/A 10/26/2017   Procedure: LUMBAR THREE- LUMBAR FOUR, LUMBAR FOUR- LUMBAR FIVE ANTEROLATERAL LUMBAR INTERBODY ARTHRODESIS;  Surgeon: Jovita Gamma, MD;  Location: [place];  Service: Neurosurgery;  Laterality: N/A;  LUMBAR 3- LUMBAR 4, LUMBAR 4- LUMBAR 5 ANTEROLATERAL  LUMBAR INTERBODY ARTHRODESIS, LUMBAR 3- LUMBAR 4, LUMBAR 4- LUMBAR 5 PERCUTANEOUS PEDICLE SCREW FIXATION  . APPENDECTOMY    . BLADDER REPAIR     x two  . CHOLECYSTECTOMY    . COLONOSCOPY    . EYE SURGERY  11/14, 12/14   Macular Dengeneration, Glaucoma  . EYE SURGERY  2016   left eye  . LESION REMOVAL Right 06/28/2013   Procedure: EXCISION 3 cm right labial sebaceous cyst;  Surgeon: Lyman Speller, MD;  Location: [facility];  Service: Gynecology;  Laterality: Right;  . LUMBAR LAMINECTOMY/DECOMPRESSION MICRODISCECTOMY Right 03/20/2016   Procedure: Right - Lumbar four-five lumbar laminotomy, foraminotomy, and possible microdiscectomy;  Surgeon: Jovita Gamma, MD;  Location: [facility];  Service: Neurosurgery;  Laterality: Right;  right  . LUMBAR PERCUTANEOUS PEDICLE SCREW 2 LEVEL  10/26/2017   Procedure: LUMBAR THREE- LUMBAR FOUR, LUMBAR FOUR- LUMBAR FIVE PERCUTANEOUS PEDICLE SCREW FIXATION;  Surgeon: Jovita Gamma, MD;  Location: [place];  Service: Neurosurgery;;  . MOHS SURGERY     procedure to remove basal cell  . TONSILLECTOMY AND ADENOIDECTOMY    . URETHRAL SLING  2007  . URETHRAL SLING  1/12   midurethral   . vertebroplasty secondary to traumatic compression fracture      There  were no vitals filed for this visit.  Subjective Assessment - 03/09/18 1148    Subjective  Pt reports that she is doing well. She has been completing her exercises without any issues.    Patient Stated Goals  improve Rt leg strength    Currently in Pain?  No/denies    Pain Onset  More than a month ago                       [facility] Adult PT Treatment/Exercise - 03/09/18 0001      Lumbar Exercises: Stretches   Piriformis Stretch  2 reps;Left;Right;30 seconds;Limitations    Piriformis Stretch Limitations  seated       Lumbar Exercises: Seated   Other Seated Lumbar Exercises  alternating marching with abdominal brace 2x10 reps each       Lumbar Exercises: Supine   Other Supine  Lumbar Exercises  BLE alternating heel tap from 90/90 position x10 reps each       Lumbar Exercises: Sidelying   Hip Abduction  10 reps;Limitations;Weights    Hip Abduction Weights (lbs)  2    Hip Abduction Limitations  x2 sets       Knee/Hip Exercises: Machines for Strengthening   Cybex Leg Press  Seat 7, single leg 30# x10 reps each       Knee/Hip Exercises: Seated   Long Arc Quad  Both;2 sets;10 reps;Strengthening    Long Arc Quad Weight  5 lbs.             PT Education - 03/09/18 1226    Education provided  Yes    Education Details  technique with therex    [employer]) Educated  Patient    Methods  Verbal cues;Explanation    Comprehension  Verbalized understanding;Returned demonstration       PT Short Term Goals - 02/25/18 1049      PT SHORT TERM GOAL #1   Title  verbalize understanding of posture/body mechanics to decrease risk of reinjury    Status  Achieved      PT SHORT TERM GOAL #2   Title  improve functional strength by demonstrating ability to stand without UE support at least 3/5 trials    Status  Achieved      PT SHORT TERM GOAL #3   Title  improve timed up and go to < 20 sec for improved functional mobility    Baseline  9 sec     Period  Weeks    Status  Achieved        PT Long Term Goals - 02/25/18 1049      PT LONG TERM GOAL #1   Title  pt will be independent with an advanced HEP to allow for maintenance of her strength, ROM and endurance after discharge from PT.     Status  On-going      PT LONG TERM GOAL #2   Title  demonstrate improved functional strength by performing 5x STS in < 20 sec withou UE support    Baseline  9 sec    Status  Achieved      PT LONG TERM GOAL #3   Title  Pt will have an improvement of atleast 6 points on the BERG balance test, to reflect a clinically significant improvement in her balance and decrease her risk of falling and causing injury.     Baseline  32/56    Time  4    Period  Weeks  Status  Revised     Target Date  04/02/18      PT LONG TERM GOAL #4   Title  amb > 250' with LRAD on various indoor/outdoor surfaces modified independently for improved function    Baseline  using SPC and close supervision    Status  On-going      PT LONG TERM GOAL #5   Title  report pain < 4/10 with activity for improved functional mobility    Baseline  reports not having any pain with daily activity     Status  Achieved      PT LONG TERM GOAL #6   Title  Pt will demo improved Rt hip strength to atleast 4/5 MMT which will improve her safety with ambulation and other activity throughout the day.    Time  4    Period  Weeks    Status  New    Target Date  04/02/18            Plan - 03/09/18 1229    Clinical Impression Statement  Pt continues to complete her HEP regularly. Session focused on progressions of trunk and LE strength, introducing seated trunk strength exercise this session. Pt does require therapist cuing to coordinate her breathing with various exercises, and will continue to benefit from this in future sessions. Introduced leg press exercise with good technique and  no significant fatigue noted end of session.    Rehab Potential  Good    PT Frequency  2x / week    PT Duration  4 weeks    PT Treatment/Interventions  ADLs/Self Care Home Management;Cryotherapy;Electrical Stimulation;Moist Heat;Traction;Therapeutic exercise;Therapeutic activities;Functional mobility training;Stair training;Gait training;DME Instruction;Ultrasound;Balance training;Neuromuscular re-education;Patient/family education;Manual techniques;Taping;Dry needling    PT Next Visit Plan  RW on stairs; sidelying hip abduction and sidelying clam (assess pain response); supine abdominal strengthening; balance activity    PT Home Exercise Plan  Access Code: [email or web address]    Consulted and Agree with Plan of Care  Patient       Patient will benefit from skilled therapeutic intervention in order to improve the following deficits and  impairments:  Abnormal gait, Decreased balance, Decreased cognition, Decreased knowledge of use of DME, Decreased mobility, Decreased safety awareness, Impaired flexibility, Postural dysfunction, Improper body mechanics, Impaired perceived functional ability, Difficulty walking, Increased muscle spasms, Pain, Increased fascial restricitons, Decreased strength, Decreased endurance  Visit Diagnosis: Unsteadiness on feet  History of falling  Muscle weakness (generalized)     Problem List Patient Active Problem List   Diagnosis Date Noted  . Chest pain 01/24/2018  . Liver lesion 11/10/2017  . Nausea & vomiting 10/31/2017  . Chronic daily headache 07/08/2017  . Memory changes 03/06/2017  . Lumbar stenosis with neurogenic claudication 03/20/2016  . Scalp pain 10/22/2015  . Post-traumatic headache 10/11/2015  . Routine health maintenance 03/06/2013  . Macular degeneration of right eye 03/03/2013  . Glaucoma 03/03/2013  . Hyperlipidemia 03/14/2009  . Depression 10/19/2007  . Essential hypertension 10/19/2007  . GASTROESOPHAGEAL REFLUX DISEASE 10/19/2007  . Osteoarthritis 10/19/2007  . STRESS INCONTINENCE 10/19/2007  . Allergic rhinitis 10/19/2007    1:13 PM,03/09/18 Sherol Dade PT, DPT [facility]  [facility] [address], [place], [place], [address] Phone: [phone]   Fax:  [phone]  Name: [name] MRN: [id number] Date of Birth: 1935-12-21

## 2018-03-09 NOTE — Progress Notes (Signed)
Cardiology Office Note    Date:  03/10/2018   ID:  Gabrielle, Booker 1935/10/05, MRN [id number]  PCP:  Marin Olp, MD  Cardiologist: Sherren Mocha, MD-Per patient request since he sees her husband.  Chief Complaint  Patient presents with  . Follow-up    History of Present Illness:  [name] is a 82 y.o. female with history of hypertension, HLD, chronic back pain, GERD, hernia who was admitted to the hospital 01/24/2018 with substernal chest pain radiating to her right jaw lasting 1 hour.  EKG was normal, troponins negative, hemoglobin A1c 5.2 LDL 78.  2D echo showed normal LVEF 60 to 65% with no wall motion abnormality with grade 1 DD.  Nuclear stress test 02/05/2018 normal LVEF 74% was considered a low risk study there was a medium defect of moderate severity in the basal inferior lateral and anterior septal mid inferolateral and apical lateral location most likely attenuation artifact.  Cannot rule out previous MI.  No wall motion abnormality on previous echo.  Patient comes in for follow-up.  She is had no further chest pain.  Feeling well.  Complaining of rashes and lesions on her legs and arms and cannot get in with dermatology until September.    Past Medical History:  Diagnosis Date  . Anxiety   . Colon polyps   . Complication of anesthesia    takes longer to wake up- admitted overnight after colonoscopy  . Depression   . GERD (gastroesophageal reflux disease)   . Glaucoma   . Headache    migraines  . Hx: UTI (urinary tract infection)   . Hypercholesteremia   . Hypertension   . Lichen simplex chronicus   . Lumbar stenosis with neurogenic claudication   . Macular degeneration   . Osteoarthritis   . Osteopenia    of the hip  . PONV (postoperative nausea and vomiting)   . Stress incontinence, female   . Unsteady gait   . Urinary urgency   . Wears glasses   . Whooping cough    as a baby    Past Surgical History:  Procedure Laterality Date  .  ABDOMINAL HYSTERECTOMY     ovaries remain  . ANTERIOR LAT LUMBAR FUSION N/A 10/26/2017   Procedure: LUMBAR THREE- LUMBAR FOUR, LUMBAR FOUR- LUMBAR FIVE ANTEROLATERAL LUMBAR INTERBODY ARTHRODESIS;  Surgeon: Jovita Gamma, MD;  Location: [place];  Service: Neurosurgery;  Laterality: N/A;  LUMBAR 3- LUMBAR 4, LUMBAR 4- LUMBAR 5 ANTEROLATERAL LUMBAR INTERBODY ARTHRODESIS, LUMBAR 3- LUMBAR 4, LUMBAR 4- LUMBAR 5 PERCUTANEOUS PEDICLE SCREW FIXATION  . APPENDECTOMY    . BLADDER REPAIR     x two  . CHOLECYSTECTOMY    . COLONOSCOPY    . EYE SURGERY  11/14, 12/14   Macular Dengeneration, Glaucoma  . EYE SURGERY  2016   left eye  . LESION REMOVAL Right 06/28/2013   Procedure: EXCISION 3 cm right labial sebaceous cyst;  Surgeon: Lyman Speller, MD;  Location: [facility];  Service: Gynecology;  Laterality: Right;  . LUMBAR LAMINECTOMY/DECOMPRESSION MICRODISCECTOMY Right 03/20/2016   Procedure: Right - Lumbar four-five lumbar laminotomy, foraminotomy, and possible microdiscectomy;  Surgeon: Jovita Gamma, MD;  Location: [facility];  Service: Neurosurgery;  Laterality: Right;  right  . LUMBAR PERCUTANEOUS PEDICLE SCREW 2 LEVEL  10/26/2017   Procedure: LUMBAR THREE- LUMBAR FOUR, LUMBAR FOUR- LUMBAR FIVE PERCUTANEOUS PEDICLE SCREW FIXATION;  Surgeon: Jovita Gamma, MD;  Location: [place];  Service: Neurosurgery;;  . MOHS SURGERY  procedure to remove basal cell  . TONSILLECTOMY AND ADENOIDECTOMY    . URETHRAL SLING  2007  . URETHRAL SLING  1/12   midurethral   . vertebroplasty secondary to traumatic compression fracture      Current Medications: Current Meds  Medication Sig  . acetaminophen (TYLENOL) 500 MG tablet Take 500 mg by mouth daily as needed for mild pain or headache.   . ALPRAZolam (XANAX) 0.5 MG tablet Take 0.5-1 tablets (0.25-0.5 mg total) by mouth 2 (two) times daily as needed. for anxiety  . azelastine (ASTELIN) 0.1 % nasal spray Place 1 spray into both nostrils 2 (two) times  daily. Use in each nostril as directed  . brimonidine-timolol (COMBIGAN) 0.2-0.5 % ophthalmic solution Place 1 drop into both eyes every 12 (twelve) hours.  Marland Kitchen ezetimibe-simvastatin (VYTORIN) 10-20 MG tablet Take 1 tablet by mouth daily.  Marland Kitchen ibuprofen (ADVIL,MOTRIN) 200 MG tablet Take 200 mg by mouth every 8 (eight) hours as needed.  Marland Kitchen ketoconazole (NIZORAL) 2 % cream Apply 1 application topically daily. For circular area on left lower leg  . nadolol (CORGARD) 40 MG tablet Take 1 tablet (40 mg total) by mouth at bedtime.  Marland Kitchen omeprazole (PRILOSEC) 20 MG capsule Take 20 mg by mouth daily before breakfast.  . sodium chloride (OCEAN) 0.65 % SOLN nasal spray Place 1 spray into both nostrils 2 (two) times daily as needed for congestion.  . traZODone (DESYREL) 100 MG tablet TAKE 1/4 TO 1/2 TABLET BY MOUTH AT BEDTIME IF NEEDED FOR SLEEP  . triamcinolone ointment (KENALOG) 0.5 % Apply 1 application topically 2 (two) times daily.  Marland Kitchen venlafaxine XR (EFFEXOR-XR) 150 MG 24 hr capsule TAKE 1 CAPSULE BY MOUTH TWICE A DAY     Allergies:   Citrate of magnesia; Codeine; and Shingrix [zoster vac recomb adjuvanted]   Social History   Socioeconomic History  . Marital status: Married    Spouse name: Not on file  . Number of children: 2  . Years of education: [employer]  . Highest education level: Not on file  Occupational History  . Occupation: Retired  Scientific laboratory technician  . Financial resource strain: Not on file  . Food insecurity:    Worry: Not on file    Inability: Not on file  . Transportation needs:    Medical: Not on file    Non-medical: Not on file  Tobacco Use  . Smoking status: Former Smoker    Last attempt to quit: 08/11/1965    Years since quitting: 52.6  . Smokeless tobacco: Never Used  . Tobacco comment: has not smoked in 45 years   Substance and Sexual Activity  . Alcohol use: Yes    Alcohol/week: 0.6 oz    Types: 1 Standard drinks or equivalent per week    Comment: occ   . Drug use: No  .  Sexual activity: Not Currently    Partners: Male    Birth control/protection: Surgical    Comment: TAH  Lifestyle  . Physical activity:    Days per week: Not on file    Minutes per session: Not on file  . Stress: Not on file  Relationships  . Social connections:    Talks on phone: Not on file    Gets together: Not on file    Attends religious service: Not on file    Active member of club or organization: Not on file    Attends meetings of clubs or organizations: Not on file    Relationship status:  Not on file  Other Topics Concern  . Not on file  Social History Narrative   HSG, attended [employer]   Married '65   1 son '67, 1 Daughter '69; 5 grandchildren. 1 grandson dealing with drugs after divorce of parents. Oldest.    Work: retired Pharmacist, hospital   Marriage in good health   Former smoker   Right-handed   Caffeine: 11/2 cups per day     Family History:  The patient's family history includes Alcoholism in her mother; Breast cancer in her maternal grandmother; Cancer in her sister; Congestive Heart Failure in her mother; Hypertension in her sister; Osteoporosis in her mother; Prostate cancer in her father; Rheum arthritis in her daughter; Thyroid disease in her mother and sister.   ROS:   Please see the history of present illness.    Review of Systems  Constitution: Negative.  HENT: Negative.   Eyes: Negative.   Cardiovascular: Negative.   Respiratory: Negative.   Hematologic/Lymphatic: Negative.   Skin: Positive for rash.  Musculoskeletal: Negative.  Negative for joint pain.  Gastrointestinal: Negative.   Genitourinary: Negative.   Neurological: Negative.    All other systems reviewed and are negative.   PHYSICAL EXAM:   VS:  BP 110/62   Pulse 66   Ht 5' (1.524 m)   Wt 156 lb (70.8 kg)   LMP 08/11/1974   SpO2 98%   BMI 30.47 kg/m   Physical Exam  GEN: Well nourished, well developed, in no acute distress  Neck: no JVD, carotid bruits, or masses Cardiac:RRR;  no murmurs, rubs, or gallops  Respiratory:  clear to auscultation bilaterally, normal work of breathing GI: soft, nontender, nondistended, + BS Ext: without cyanosis, clubbing, or edema, Good distal pulses bilaterally Neuro:  Alert and Oriented x 3 Psych: euthymic mood, full affect  Wt Readings from Last 3 Encounters:  03/10/18 156 lb (70.8 kg)  03/04/18 164 lb (74.4 kg)  02/17/18 162 lb 9.6 oz (73.8 kg)      Studies/Labs Reviewed:   EKG:  EKG is not ordered today.  Recent Labs: 11/10/2017: ALT 22 01/24/2018: BUN 13; Creatinine, Ser 0.76; Hemoglobin 14.0; Platelets 239; Potassium 4.3; Sodium 140   Lipid Panel    Component Value Date/Time   CHOL 144 01/25/2018 0432   TRIG 125 01/25/2018 0432   HDL 41 01/25/2018 0432   CHOLHDL 3.5 01/25/2018 0432   VLDL 25 01/25/2018 0432   LDLCALC 78 01/25/2018 0432   LDLDIRECT 84.0 03/06/2017 1402    Additional studies/ records that were reviewed today include:  Lexiscan Myoview 02/05/2018  Study Highlights    Nuclear stress EF: 74%. The left ventricular ejection fraction is hyperdynamic (>65%).  Defect 1: There is a medium defect of moderate severity present in the basal inferolateral, mid anteroseptal, mid inferolateral and apical lateral location. This is most likely attenuation artifact. This area contracts fairly well. I cannot rule out a previous septal MI .  This is a low risk study.     Echo:  01/24/18:  Study Conclusions   - Left ventricle: The cavity size was normal. There was mild   concentric hypertrophy. Systolic function was normal. The   estimated ejection fraction was in the range of 60% to 65%. Wall   motion was normal; there were no regional wall motion   abnormalities. Doppler parameters are consistent with abnormal   left ventricular relaxation (grade 1 diastolic dysfunction).   Doppler parameters are consistent with indeterminate ventricular   filling pressure. -  Aortic valve: Transvalvular velocity was within  the normal range.   There was no stenosis. There was trivial regurgitation. - Mitral valve: Transvalvular velocity was within the normal range.   There was no evidence for stenosis. There was trivial   regurgitation. - Right ventricle: The cavity size was normal. Wall thickness was   normal. Systolic function was normal. - Tricuspid valve: There was mild regurgitation. - Pulmonary arteries: Systolic pressure was within the normal   range. PA peak pressure: 31 mm Hg (S).     ASSESSMENT:    1. Chest pain, unspecified type   2. Essential hypertension   3. Mixed hyperlipidemia      PLAN:  In order of problems listed above:  Chest pain with negative troponins normal EKG, normal LV function with grade 1 DD on 2D echo and low risk Myoview-no further chest pain.  Follow-up with Dr. Burt Knack in 1 year.  Essential hypertension blood pressure well controlled  Hyperlipidemia LDL 78  Medication Adjustments/Labs and Tests Ordered: Current medicines are reviewed at length with the patient today.  Concerns regarding medicines are outlined above.  Medication changes, Labs and Tests ordered today are listed in the Patient Instructions below. Patient Instructions  Medication Instructions: Your physician recommends that you continue on your current medications as directed. Please refer to the Current Medication list given to you today.   Labwork: None Ordered  Procedures/Testing: None Ordered  Follow-Up: Your physician wants you to follow-up in: 1 year with Dr.Cooper. You will receive a reminder letter in the mail two months in advance. If you don't receive a letter, please call our office to schedule the follow-up appointment.   Any Additional Special Instructions Will Be Listed Below (If Applicable).     If you need a refill on your cardiac medications before your next appointment, please call your pharmacy.      Sumner Boast, PA-C  03/10/2018 9:59 AM    [facility] [address], [place], [place]  [address] Phone: [phone]; Fax: [phone]

## 2018-03-10 ENCOUNTER — Ambulatory Visit: Payer: Medicare Other | Admitting: Physician Assistant

## 2018-03-10 ENCOUNTER — Encounter: Payer: Self-pay | Admitting: Physician Assistant

## 2018-03-10 VITALS — BP 110/62 | HR 66 | Ht 60.0 in | Wt 156.0 lb

## 2018-03-10 DIAGNOSIS — E782 Mixed hyperlipidemia: Secondary | ICD-10-CM | POA: Diagnosis not present

## 2018-03-10 DIAGNOSIS — R079 Chest pain, unspecified: Secondary | ICD-10-CM

## 2018-03-10 DIAGNOSIS — I10 Essential (primary) hypertension: Secondary | ICD-10-CM | POA: Diagnosis not present

## 2018-03-10 NOTE — Patient Instructions (Signed)
Medication Instructions: Your physician recommends that you continue on your current medications as directed. Please refer to the Current Medication list given to you today.   Labwork: None Ordered  Procedures/Testing: None Ordered  Follow-Up: Your physician wants you to follow-up in: 1 year with Dr.Cooper. You will receive a reminder letter in the mail two months in advance. If you don't receive a letter, please call our office to schedule the follow-up appointment.   Any Additional Special Instructions Will Be Listed Below (If Applicable).     If you need a refill on your cardiac medications before your next appointment, please call your pharmacy.

## 2018-03-11 ENCOUNTER — Ambulatory Visit: Payer: Medicare Other | Attending: Neurosurgery | Admitting: Physical Therapy

## 2018-03-11 ENCOUNTER — Encounter: Payer: Self-pay | Admitting: Physical Therapy

## 2018-03-11 DIAGNOSIS — G8929 Other chronic pain: Secondary | ICD-10-CM | POA: Diagnosis present

## 2018-03-11 DIAGNOSIS — M6281 Muscle weakness (generalized): Secondary | ICD-10-CM

## 2018-03-11 DIAGNOSIS — R2681 Unsteadiness on feet: Secondary | ICD-10-CM | POA: Insufficient documentation

## 2018-03-11 DIAGNOSIS — Z9181 History of falling: Secondary | ICD-10-CM | POA: Insufficient documentation

## 2018-03-11 DIAGNOSIS — M545 Low back pain: Secondary | ICD-10-CM | POA: Insufficient documentation

## 2018-03-11 NOTE — Therapy (Signed)
[facility] [address] [place], [place], [address] Phone: [phone]   Fax:  [phone]  Physical Therapy Treatment  Patient Details  Name: [name] MRN: [id number] Date of Birth: Jun 17, 1936 Referring Provider: Jovita Gamma, MD   Encounter Date: 03/11/2018  PT End of Session - 03/11/18 1342    Visit Number  21    Date for PT Re-Evaluation  04/02/18    Authorization Type  [employer] Medicare     Authorization Time Period  02/02/18 to 03/04/18, NEW: 03/05/18 to 04/02/18    Authorization - Visit Number  5    Authorization - Number of Visits  10    PT Start Time  2505    PT Stop Time  1229    PT Time Calculation (min)  42 min    Activity Tolerance  Patient tolerated treatment well;No increased pain    Behavior During Therapy  WFL for tasks assessed/performed       Past Medical History:  Diagnosis Date  . Anxiety   . Colon polyps   . Complication of anesthesia    takes longer to wake up- admitted overnight after colonoscopy  . Depression   . GERD (gastroesophageal reflux disease)   . Glaucoma   . Headache    migraines  . Hx: UTI (urinary tract infection)   . Hypercholesteremia   . Hypertension   . Lichen simplex chronicus   . Lumbar stenosis with neurogenic claudication   . Macular degeneration   . Osteoarthritis   . Osteopenia    of the hip  . PONV (postoperative nausea and vomiting)   . Stress incontinence, female   . Unsteady gait   . Urinary urgency   . Wears glasses   . Whooping cough    as a baby    Past Surgical History:  Procedure Laterality Date  . ABDOMINAL HYSTERECTOMY     ovaries remain  . ANTERIOR LAT LUMBAR FUSION N/A 10/26/2017   Procedure: LUMBAR THREE- LUMBAR FOUR, LUMBAR FOUR- LUMBAR FIVE ANTEROLATERAL LUMBAR INTERBODY ARTHRODESIS;  Surgeon: Jovita Gamma, MD;  Location: [facility];  Service: Neurosurgery;  Laterality: N/A;  LUMBAR 3- LUMBAR 4, LUMBAR 4- LUMBAR 5 ANTEROLATERAL  LUMBAR INTERBODY ARTHRODESIS, LUMBAR 3- LUMBAR 4, LUMBAR 4- LUMBAR 5 PERCUTANEOUS PEDICLE SCREW FIXATION  . APPENDECTOMY    . BLADDER REPAIR     x two  . CHOLECYSTECTOMY    . COLONOSCOPY    . EYE SURGERY  11/14, 12/14   Macular Dengeneration, Glaucoma  . EYE SURGERY  2016   left eye  . LESION REMOVAL Right 06/28/2013   Procedure: EXCISION 3 cm right labial sebaceous cyst;  Surgeon: Lyman Speller, MD;  Location: [facility];  Service: Gynecology;  Laterality: Right;  . LUMBAR LAMINECTOMY/DECOMPRESSION MICRODISCECTOMY Right 03/20/2016   Procedure: Right - Lumbar four-five lumbar laminotomy, foraminotomy, and possible microdiscectomy;  Surgeon: Jovita Gamma, MD;  Location: [facility];  Service: Neurosurgery;  Laterality: Right;  right  . LUMBAR PERCUTANEOUS PEDICLE SCREW 2 LEVEL  10/26/2017   Procedure: LUMBAR THREE- LUMBAR FOUR, LUMBAR FOUR- LUMBAR FIVE PERCUTANEOUS PEDICLE SCREW FIXATION;  Surgeon: Jovita Gamma, MD;  Location: [facility];  Service: Neurosurgery;;  . MOHS SURGERY     procedure to remove basal cell  . TONSILLECTOMY AND ADENOIDECTOMY    . URETHRAL SLING  2007  . URETHRAL SLING  1/12   midurethral   . vertebroplasty secondary to traumatic compression fracture      There  were no vitals filed for this visit.  Subjective Assessment - 03/11/18 1154    Subjective  Pt reports that she woke up with her back a little sore. It feels ok right now.     Patient Stated Goals  improve Rt leg strength    Currently in Pain?  No/denies    Pain Onset  More than a month ago              [facility] Adult PT Treatment/Exercise - 03/11/18 0001      Lumbar Exercises: Stretches   Active Hamstring Stretch  Limitations    Active Hamstring Stretch Limitations  10x5 sec hold each LE       Lumbar Exercises: Standing   Other Standing Lumbar Exercises  hip extension with forearms resting on countertop x15 reps each with 2#       Lumbar Exercises: Seated   Other Seated Lumbar Exercises   BUE press into foam roll with breathing control x15 reps     Other Seated Lumbar Exercises  abdominal brace with hip flexion x10 reps each; rows with yellow TB 2x10 reps      Knee/Hip Exercises: Standing   Other Standing Knee Exercises  hip abduction side stepping with red TB around ankles 2x10 reps Lt and Rt           Balance Exercises - 03/11/18 1226      Balance Exercises: Standing   Standing Eyes Closed  Narrow base of support (BOS);Solid surface;2 reps;30 secs    Tandem Stance  Eyes open;2 reps;30 secs        PT Education - 03/11/18 1342    Education provided  Yes    Education Details  technique with therex     Person(s) Educated  Patient    Methods  Explanation;Verbal cues    Comprehension  Verbalized understanding;Returned demonstration       PT Short Term Goals - 02/25/18 1049      PT SHORT TERM GOAL #1   Title  verbalize understanding of posture/body mechanics to decrease risk of reinjury    Status  Achieved      PT SHORT TERM GOAL #2   Title  improve functional strength by demonstrating ability to stand without UE support at least 3/5 trials    Status  Achieved      PT SHORT TERM GOAL #3   Title  improve timed up and go to < 20 sec for improved functional mobility    Baseline  9 sec     Period  Weeks    Status  Achieved        PT Long Term Goals - 02/25/18 1049      PT LONG TERM GOAL #1   Title  pt will be independent with an advanced HEP to allow for maintenance of her strength, ROM and endurance after discharge from PT.     Status  On-going      PT LONG TERM GOAL #2   Title  demonstrate improved functional strength by performing 5x STS in < 20 sec withou UE support    Baseline  9 sec    Status  Achieved      PT LONG TERM GOAL #3   Title  Pt will have an improvement of atleast 6 points on the BERG balance test, to reflect a clinically significant improvement in her balance and decrease her risk of falling and causing injury.     Baseline  32/56  Time  4    Period  Weeks    Status  Revised    Target Date  04/02/18      PT LONG TERM GOAL #4   Title  amb > 250' with LRAD on various indoor/outdoor surfaces modified independently for improved function    Baseline  using SPC and close supervision    Status  On-going      PT LONG TERM GOAL #5   Title  report pain < 4/10 with activity for improved functional mobility    Baseline  reports not having any pain with daily activity     Status  Achieved      PT LONG TERM GOAL #6   Title  Pt will demo improved Rt hip strength to atleast 4/5 MMT which will improve her safety with ambulation and other activity throughout the day.    Time  4    Period  Weeks    Status  New    Target Date  04/02/18            Plan - 03/11/18 1342    Clinical Impression Statement  Today's session focused on progressing trunk strength and stability exercise to more upright positions. Pt had difficulty activating deep abdominals during this. Also completed standing activity to improve balance, pt was able to maintain tandem stance with CGA and therapist instruction to focus on proper breathing. Ended session with assistance out to her car due to LE fatigue reported end of session.     Rehab Potential  Good    PT Frequency  2x / week    PT Duration  4 weeks    PT Treatment/Interventions  ADLs/Self Care Home Management;Cryotherapy;Electrical Stimulation;Moist Heat;Traction;Therapeutic exercise;Therapeutic activities;Functional mobility training;Stair training;Gait training;DME Instruction;Ultrasound;Balance training;Neuromuscular re-education;Patient/family education;Manual techniques;Taping;Dry needling    PT Next Visit Plan  RW on stairs; sidelying hip abduction and sidelying clam (assess pain response); supine abdominal strengthening; balance activity    PT Home Exercise Plan  Access Code: [email or web address]    Consulted and Agree with Plan of Care  Patient       Patient will benefit from skilled therapeutic  intervention in order to improve the following deficits and impairments:  Abnormal gait, Decreased balance, Decreased cognition, Decreased knowledge of use of DME, Decreased mobility, Decreased safety awareness, Impaired flexibility, Postural dysfunction, Improper body mechanics, Impaired perceived functional ability, Difficulty walking, Increased muscle spasms, Pain, Increased fascial restricitons, Decreased strength, Decreased endurance  Visit Diagnosis: Unsteadiness on feet  History of falling  Muscle weakness (generalized)  Chronic right-sided low back pain without sciatica     Problem List Patient Active Problem List   Diagnosis Date Noted  . Chest pain 01/24/2018  . Liver lesion 11/10/2017  . Nausea & vomiting 10/31/2017  . Chronic daily headache 07/08/2017  . Memory changes 03/06/2017  . Lumbar stenosis with neurogenic claudication 03/20/2016  . Scalp pain 10/22/2015  . Post-traumatic headache 10/11/2015  . Routine health maintenance 03/06/2013  . Macular degeneration of right eye 03/03/2013  . Glaucoma 03/03/2013  . Hyperlipidemia 03/14/2009  . Depression 10/19/2007  . Essential hypertension 10/19/2007  . GASTROESOPHAGEAL REFLUX DISEASE 10/19/2007  . Osteoarthritis 10/19/2007  . STRESS INCONTINENCE 10/19/2007  . Allergic rhinitis 10/19/2007    1:47 PM,03/11/18 Sherol Dade PT, DPT [facility] [address], [place], [place], [address] Phone: [phone]   Fax:  [phone]  Name: [name] MRN: [id number]  Date of Birth: Jul 30, 1936

## 2018-03-16 ENCOUNTER — Encounter: Payer: Self-pay | Admitting: Physical Therapy

## 2018-03-16 ENCOUNTER — Ambulatory Visit: Payer: Medicare Other | Admitting: Physical Therapy

## 2018-03-16 DIAGNOSIS — G8929 Other chronic pain: Secondary | ICD-10-CM

## 2018-03-16 DIAGNOSIS — M545 Low back pain, unspecified: Secondary | ICD-10-CM

## 2018-03-16 DIAGNOSIS — Z9181 History of falling: Secondary | ICD-10-CM

## 2018-03-16 DIAGNOSIS — R2681 Unsteadiness on feet: Secondary | ICD-10-CM | POA: Diagnosis not present

## 2018-03-16 DIAGNOSIS — M6281 Muscle weakness (generalized): Secondary | ICD-10-CM

## 2018-03-16 NOTE — Therapy (Signed)
[facility] [address], [place], [place], [address] Phone: [phone]   Fax:  [phone]  Physical Therapy Treatment  Patient Details  Name: [name] MRN: [id number] Date of Birth: [birth year] Referring Provider: Jovita Gamma, MD   Encounter Date: 03/16/2018  PT End of Session - 03/16/18 1337    Visit Number  22    Date for PT Re-Evaluation  04/02/18    Authorization Type  [employer] Medicare     Authorization Time Period  02/02/18 to 03/04/18, NEW: 03/05/18 to 04/02/18    Authorization - Visit Number  6    Authorization - Number of Visits  10    PT Start Time  1324    PT Stop Time  4010    PT Time Calculation (min)  44 min    Activity Tolerance  Patient tolerated treatment well;No increased pain    Behavior During Therapy  WFL for tasks assessed/performed       Past Medical History:  Diagnosis Date  . Anxiety   . Colon polyps   . Complication of anesthesia    takes longer to wake up- admitted overnight after colonoscopy  . Depression   . GERD (gastroesophageal reflux disease)   . Glaucoma   . Headache    migraines  . Hx: UTI (urinary tract infection)   . Hypercholesteremia   . Hypertension   . Lichen simplex chronicus   . Lumbar stenosis with neurogenic claudication   . Macular degeneration   . Osteoarthritis   . Osteopenia    of the hip  . PONV (postoperative nausea and vomiting)   . Stress incontinence, female   . Unsteady gait   . Urinary urgency   . Wears glasses   . Whooping cough    as a baby    Past Surgical History:  Procedure Laterality Date  . ABDOMINAL HYSTERECTOMY     ovaries remain  . ANTERIOR LAT LUMBAR FUSION N/A 10/26/2017   Procedure: LUMBAR THREE- LUMBAR FOUR, LUMBAR FOUR- LUMBAR FIVE ANTEROLATERAL LUMBAR INTERBODY ARTHRODESIS;  Surgeon: Jovita Gamma, MD;  Location: [facility];  Service: Neurosurgery;  Laterality: N/A;  LUMBAR 3- LUMBAR 4, LUMBAR 4- LUMBAR 5 ANTEROLATERAL  LUMBAR INTERBODY ARTHRODESIS, LUMBAR 3- LUMBAR 4, LUMBAR 4- LUMBAR 5 PERCUTANEOUS PEDICLE SCREW FIXATION  . APPENDECTOMY    . BLADDER REPAIR     x two  . CHOLECYSTECTOMY    . COLONOSCOPY    . EYE SURGERY  11/14, 12/14   Macular Dengeneration, Glaucoma  . EYE SURGERY  2016   left eye  . LESION REMOVAL Right 06/28/2013   Procedure: EXCISION 3 cm right labial sebaceous cyst;  Surgeon: Lyman Speller, MD;  Location: [facility];  Service: Gynecology;  Laterality: Right;  . LUMBAR LAMINECTOMY/DECOMPRESSION MICRODISCECTOMY Right 03/20/2016   Procedure: Right - Lumbar four-five lumbar laminotomy, foraminotomy, and possible microdiscectomy;  Surgeon: Jovita Gamma, MD;  Location: [facility];  Service: Neurosurgery;  Laterality: Right;  right  . LUMBAR PERCUTANEOUS PEDICLE SCREW 2 LEVEL  10/26/2017   Procedure: LUMBAR THREE- LUMBAR FOUR, LUMBAR FOUR- LUMBAR FIVE PERCUTANEOUS PEDICLE SCREW FIXATION;  Surgeon: Jovita Gamma, MD;  Location: [facility];  Service: Neurosurgery;;  . MOHS SURGERY     procedure to remove basal cell  . TONSILLECTOMY AND ADENOIDECTOMY    . URETHRAL SLING  2007  . URETHRAL SLING  1/12   midurethral   . vertebroplasty secondary to traumatic compression fracture      There  were no vitals filed for this visit.  Subjective Assessment - 03/16/18 1234    Subjective  Pt reports that she has been trying to complete her exercises at home. No pain right now.     Patient Stated Goals  improve Rt leg strength    Currently in Pain?  No/denies    Pain Onset  More than a month ago                       [facility] Adult PT Treatment/Exercise - 03/16/18 0001      Lumbar Exercises: Aerobic   Nustep  L2 x10 min, PT present to discuss walker placement while ascending/descending steps       Knee/Hip Exercises: Standing   Heel Raises  Both;1 set;15 reps    Forward Step Up  2 sets;Right;10 reps;Hand Hold: 1;Step Height: 4";Limitations    Forward Step Up Limitations  unable  on 6" step       Knee/Hip Exercises: Seated   Long Arc Quad  Both;2 sets;10 reps;Strengthening    Long Arc Quad Weight  5 lbs.    Hamstring Curl  2 sets;10 reps;Both    Hamstring Limitations  double red TB          Balance Exercises - 03/16/18 1314      Balance Exercises: Standing   Standing Eyes Opened  Narrow base of support (BOS);2 reps;Solid surface;30 secs    Tandem Stance  Eyes open;2 reps;30 secs        PT Education - 03/16/18 1337    Education provided  Yes    Education Details  mechanics with stairs/RW; technique with therex     Person(s) Educated  Patient    Methods  Explanation;Verbal cues;Demonstration    Comprehension  Returned demonstration;Verbalized understanding       PT Short Term Goals - 02/25/18 1049      PT SHORT TERM GOAL #1   Title  verbalize understanding of posture/body mechanics to decrease risk of reinjury    Status  Achieved      PT SHORT TERM GOAL #2   Title  improve functional strength by demonstrating ability to stand without UE support at least 3/5 trials    Status  Achieved      PT SHORT TERM GOAL #3   Title  improve timed up and go to < 20 sec for improved functional mobility    Baseline  9 sec     Period  Weeks    Status  Achieved        PT Long Term Goals - 02/25/18 1049      PT LONG TERM GOAL #1   Title  pt will be independent with an advanced HEP to allow for maintenance of her strength, ROM and endurance after discharge from PT.     Status  On-going      PT LONG TERM GOAL #2   Title  demonstrate improved functional strength by performing 5x STS in < 20 sec withou UE support    Baseline  9 sec    Status  Achieved      PT LONG TERM GOAL #3   Title  Pt will have an improvement of atleast 6 points on the BERG balance test, to reflect a clinically significant improvement in her balance and decrease her risk of falling and causing injury.     Baseline  32/56    Time  4    Period  Weeks  Status  Revised    Target  Date  04/02/18      PT LONG TERM GOAL #4   Title  amb > 250' with LRAD on various indoor/outdoor surfaces modified independently for improved function    Baseline  using SPC and close supervision    Status  On-going      PT LONG TERM GOAL #5   Title  report pain < 4/10 with activity for improved functional mobility    Baseline  reports not having any pain with daily activity     Status  Achieved      PT LONG TERM GOAL #6   Title  Pt will demo improved Rt hip strength to atleast 4/5 MMT which will improve her safety with ambulation and other activity throughout the day.    Time  4    Period  Weeks    Status  New    Target Date  04/02/18            Plan - 03/16/18 1338    Clinical Impression Statement  Pt arrived with her RW and therapist was able to discuss proper use with the stairs at home. Continued with therex to promote LE strength, noting increased difficulty with RLE step up onto 6" steps. Pt was able to complete on a 4" step with no more than 1 UE support. Pt demonstrated improved steadiness with tandem stance, evident by her ability to attain the position without UE support or LOB for 15 sec at a time. Will continue with current POC to progress LE strength, trunk strength and improve balance and safety with daily activity.     Rehab Potential  Good    PT Frequency  2x / week    PT Duration  4 weeks    PT Treatment/Interventions  ADLs/Self Care Home Management;Cryotherapy;Electrical Stimulation;Moist Heat;Traction;Therapeutic exercise;Therapeutic activities;Functional mobility training;Stair training;Gait training;DME Instruction;Ultrasound;Balance training;Neuromuscular re-education;Patient/family education;Manual techniques;Taping;Dry needling    PT Next Visit Plan  sidelying hip abduction and sidelying clam (assess pain response); supine abdominal strengthening; balance activity    PT Home Exercise Plan  Access Code: [email or web address]    Consulted and Agree with Plan of Care   Patient       Patient will benefit from skilled therapeutic intervention in order to improve the following deficits and impairments:  Abnormal gait, Decreased balance, Decreased cognition, Decreased knowledge of use of DME, Decreased mobility, Decreased safety awareness, Impaired flexibility, Postural dysfunction, Improper body mechanics, Impaired perceived functional ability, Difficulty walking, Increased muscle spasms, Pain, Increased fascial restricitons, Decreased strength, Decreased endurance  Visit Diagnosis: Unsteadiness on feet  History of falling  Muscle weakness (generalized)  Chronic right-sided low back pain without sciatica     Problem List Patient Active Problem List   Diagnosis Date Noted  . Chest pain 01/24/2018  . Liver lesion 11/10/2017  . Nausea & vomiting 10/31/2017  . Chronic daily headache 07/08/2017  . Memory changes 03/06/2017  . Lumbar stenosis with neurogenic claudication 03/20/2016  . Scalp pain 10/22/2015  . Post-traumatic headache 10/11/2015  . Routine health maintenance 03/06/2013  . Macular degeneration of right eye 03/03/2013  . Glaucoma 03/03/2013  . Hyperlipidemia 03/14/2009  . Depression 10/19/2007  . Essential hypertension 10/19/2007  . GASTROESOPHAGEAL REFLUX DISEASE 10/19/2007  . Osteoarthritis 10/19/2007  . STRESS INCONTINENCE 10/19/2007  . Allergic rhinitis 10/19/2007   1:44 PM,03/16/18 Sherol Dade PT, DPT [facility] [address],  [place], [place], [address] Phone: [phone]   Fax:  [phone]  Name: JORDIE SKALSKY MRN: [id number] Date of Birth: 04-09-1936

## 2018-03-18 ENCOUNTER — Ambulatory Visit: Payer: Medicare Other | Admitting: Physical Therapy

## 2018-03-22 ENCOUNTER — Ambulatory Visit: Payer: Medicare Other | Admitting: Physical Therapy

## 2018-03-23 ENCOUNTER — Encounter: Payer: Self-pay | Admitting: Physical Therapy

## 2018-03-23 ENCOUNTER — Ambulatory Visit: Payer: Medicare Other | Admitting: Physical Therapy

## 2018-03-23 DIAGNOSIS — Z9181 History of falling: Secondary | ICD-10-CM

## 2018-03-23 DIAGNOSIS — M6281 Muscle weakness (generalized): Secondary | ICD-10-CM

## 2018-03-23 DIAGNOSIS — G8929 Other chronic pain: Secondary | ICD-10-CM

## 2018-03-23 DIAGNOSIS — R2681 Unsteadiness on feet: Secondary | ICD-10-CM | POA: Diagnosis not present

## 2018-03-23 DIAGNOSIS — M545 Low back pain: Secondary | ICD-10-CM

## 2018-03-23 NOTE — Therapy (Signed)
[facility] [address] [place], [place], [address] Phone: [phone]   Fax:  [phone]  Physical Therapy Treatment  Patient Details  Name: [name] MRN: [id number] Date of Birth: 05-13-1936 Referring Provider: Jovita Gamma, MD   Encounter Date: 03/23/2018  PT End of Session - 03/23/18 1158    Visit Number  23    Date for PT Re-Evaluation  04/02/18    Authorization Type  [employer] Medicare     Authorization Time Period  02/02/18 to 03/04/18, NEW: 03/05/18 to 04/02/18    Authorization - Visit Number  7    Authorization - Number of Visits  10    PT Start Time  1150    PT Stop Time  1230    PT Time Calculation (min)  40 min    Activity Tolerance  Patient tolerated treatment well;No increased pain    Behavior During Therapy  WFL for tasks assessed/performed       Past Medical History:  Diagnosis Date  . Anxiety   . Colon polyps   . Complication of anesthesia    takes longer to wake up- admitted overnight after colonoscopy  . Depression   . GERD (gastroesophageal reflux disease)   . Glaucoma   . Headache    migraines  . Hx: UTI (urinary tract infection)   . Hypercholesteremia   . Hypertension   . Lichen simplex chronicus   . Lumbar stenosis with neurogenic claudication   . Macular degeneration   . Osteoarthritis   . Osteopenia    of the hip  . PONV (postoperative nausea and vomiting)   . Stress incontinence, female   . Unsteady gait   . Urinary urgency   . Wears glasses   . Whooping cough    as a baby    Past Surgical History:  Procedure Laterality Date  . ABDOMINAL HYSTERECTOMY     ovaries remain  . ANTERIOR LAT LUMBAR FUSION N/A 10/26/2017   Procedure: LUMBAR THREE- LUMBAR FOUR, LUMBAR FOUR- LUMBAR FIVE ANTEROLATERAL LUMBAR INTERBODY ARTHRODESIS;  Surgeon: Jovita Gamma, MD;  Location: [place];  Service: Neurosurgery;  Laterality: N/A;  LUMBAR 3- LUMBAR 4, LUMBAR 4- LUMBAR 5 ANTEROLATERAL  LUMBAR INTERBODY ARTHRODESIS, LUMBAR 3- LUMBAR 4, LUMBAR 4- LUMBAR 5 PERCUTANEOUS PEDICLE SCREW FIXATION  . APPENDECTOMY    . BLADDER REPAIR     x two  . CHOLECYSTECTOMY    . COLONOSCOPY    . EYE SURGERY  11/14, 12/14   Macular Dengeneration, Glaucoma  . EYE SURGERY  2016   left eye  . LESION REMOVAL Right 06/28/2013   Procedure: EXCISION 3 cm right labial sebaceous cyst;  Surgeon: Lyman Speller, MD;  Location: [facility];  Service: Gynecology;  Laterality: Right;  . LUMBAR LAMINECTOMY/DECOMPRESSION MICRODISCECTOMY Right 03/20/2016   Procedure: Right - Lumbar four-five lumbar laminotomy, foraminotomy, and possible microdiscectomy;  Surgeon: Jovita Gamma, MD;  Location: [facility];  Service: Neurosurgery;  Laterality: Right;  right  . LUMBAR PERCUTANEOUS PEDICLE SCREW 2 LEVEL  10/26/2017   Procedure: LUMBAR THREE- LUMBAR FOUR, LUMBAR FOUR- LUMBAR FIVE PERCUTANEOUS PEDICLE SCREW FIXATION;  Surgeon: Jovita Gamma, MD;  Location: [place];  Service: Neurosurgery;;  . MOHS SURGERY     procedure to remove basal cell  . TONSILLECTOMY AND ADENOIDECTOMY    . URETHRAL SLING  2007  . URETHRAL SLING  1/12   midurethral   . vertebroplasty secondary to traumatic compression fracture      There  were no vitals filed for this visit.  Subjective Assessment - 03/23/18 1154    Subjective  Pt reports that things are going well. She is busy with her husband's appointments, but has been trying to complete her exercises in the kitchen. No pain currently.     Patient Stated Goals  improve Rt leg strength    Currently in Pain?  No/denies    Pain Onset  More than a month ago                       [facility] Adult PT Treatment/Exercise - 03/23/18 0001      Lumbar Exercises: Standing   Other Standing Lumbar Exercises  BUE pressdown with red TB 2x15 reps; side stepping x2 each direction 2x5 reps each with yellow TB around ankles       Lumbar Exercises: Seated   Other Seated Lumbar Exercises   seated clam with blue TB x15 reps; seated trunk rotation with straight arms, green TB x10 reps partial ROM; BUE pressdown with green TB 2x10 reps     Other Seated Lumbar Exercises  rows with red TB 2x15 reps       Knee/Hip Exercises: Standing   Heel Raises  Both;2 sets;10 reps    Heel Raises Limitations  weight shift Rt           Balance Exercises - 03/23/18 1351      Balance Exercises: Standing   Tandem Stance  Eyes open;Foam/compliant surface;2 reps;30 secs        PT Education - 03/23/18 1350    Education provided  Yes    Education Details  technique with therex    Person(s) Educated  Patient    Methods  Explanation    Comprehension  Verbalized understanding       PT Short Term Goals - 02/25/18 1049      PT SHORT TERM GOAL #1   Title  verbalize understanding of posture/body mechanics to decrease risk of reinjury    Status  Achieved      PT SHORT TERM GOAL #2   Title  improve functional strength by demonstrating ability to stand without UE support at least 3/5 trials    Status  Achieved      PT SHORT TERM GOAL #3   Title  improve timed up and go to < 20 sec for improved functional mobility    Baseline  9 sec     Period  Weeks    Status  Achieved        PT Long Term Goals - 02/25/18 1049      PT LONG TERM GOAL #1   Title  pt will be independent with an advanced HEP to allow for maintenance of her strength, ROM and endurance after discharge from PT.     Status  On-going      PT LONG TERM GOAL #2   Title  demonstrate improved functional strength by performing 5x STS in < 20 sec withou UE support    Baseline  9 sec    Status  Achieved      PT LONG TERM GOAL #3   Title  Pt will have an improvement of atleast 6 points on the BERG balance test, to reflect a clinically significant improvement in her balance and decrease her risk of falling and causing injury.     Baseline  32/56    Time  4    Period  Weeks    Status  Revised    Target Date  04/02/18       PT LONG TERM GOAL #4   Title  amb > 250' with LRAD on various indoor/outdoor surfaces modified independently for improved function    Baseline  using SPC and close supervision    Status  On-going      PT LONG TERM GOAL #5   Title  report pain < 4/10 with activity for improved functional mobility    Baseline  reports not having any pain with daily activity     Status  Achieved      PT LONG TERM GOAL #6   Title  Pt will demo improved Rt hip strength to atleast 4/5 MMT which will improve her safety with ambulation and other activity throughout the day.    Time  4    Period  Weeks    Status  New    Target Date  04/02/18            Plan - 03/23/18 1201    Clinical Impression Statement  Pt arrived reporting consistent HEP adherence since her last session. Focused on therex to further improve trunk strength and posture awareness during exercise. Pt has significant Rt plantarflexor weakness which was evident during attempts to complete single leg heel raise. Pt's HEP was modified to improve Rt gastroc strength and hip abductor strength. No reported increase in pain following today's session.    Rehab Potential  Good    PT Frequency  2x / week    PT Duration  4 weeks    PT Treatment/Interventions  ADLs/Self Care Home Management;Cryotherapy;Electrical Stimulation;Moist Heat;Traction;Therapeutic exercise;Therapeutic activities;Functional mobility training;Stair training;Gait training;DME Instruction;Ultrasound;Balance training;Neuromuscular re-education;Patient/family education;Manual techniques;Taping;Dry needling    PT Next Visit Plan  f/u on sidestep with yellow TB and heel raises Rt side; sidelying hip abduction and sidelying clam (assess pain response); supine abdominal strengthening; balance activity    PT Home Exercise Plan  Access Code: [email or web address]    Consulted and Agree with Plan of Care  Patient       Patient will benefit from skilled therapeutic intervention in order to improve the  following deficits and impairments:  Abnormal gait, Decreased balance, Decreased cognition, Decreased knowledge of use of DME, Decreased mobility, Decreased safety awareness, Impaired flexibility, Postural dysfunction, Improper body mechanics, Impaired perceived functional ability, Difficulty walking, Increased muscle spasms, Pain, Increased fascial restricitons, Decreased strength, Decreased endurance  Visit Diagnosis: Unsteadiness on feet  History of falling  Muscle weakness (generalized)  Chronic right-sided low back pain without sciatica     Problem List Patient Active Problem List   Diagnosis Date Noted  . Chest pain 01/24/2018  . Liver lesion 11/10/2017  . Nausea & vomiting 10/31/2017  . Chronic daily headache 07/08/2017  . Memory changes 03/06/2017  . Lumbar stenosis with neurogenic claudication 03/20/2016  . Scalp pain 10/22/2015  . Post-traumatic headache 10/11/2015  . Routine health maintenance 03/06/2013  . Macular degeneration of right eye 03/03/2013  . Glaucoma 03/03/2013  . Hyperlipidemia 03/14/2009  . Depression 10/19/2007  . Essential hypertension 10/19/2007  . GASTROESOPHAGEAL REFLUX DISEASE 10/19/2007  . Osteoarthritis 10/19/2007  . STRESS INCONTINENCE 10/19/2007  . Allergic rhinitis 10/19/2007   1:51 PM,03/23/18 Sherol Dade PT, DPT [facility] [address] [place], [place], [address] Phone: [phone]   Fax:  [phone]  Name: JAZZIE TRAMPE MRN: [id number] Date of Birth: Oct 17, 1935

## 2018-03-25 ENCOUNTER — Encounter: Payer: Medicare Other | Admitting: Physical Therapy

## 2018-03-26 ENCOUNTER — Ambulatory Visit: Payer: Medicare Other | Admitting: Physical Therapy

## 2018-03-26 ENCOUNTER — Encounter: Payer: Self-pay | Admitting: Physical Therapy

## 2018-03-26 DIAGNOSIS — R2681 Unsteadiness on feet: Secondary | ICD-10-CM

## 2018-03-26 DIAGNOSIS — M6281 Muscle weakness (generalized): Secondary | ICD-10-CM

## 2018-03-26 DIAGNOSIS — G8929 Other chronic pain: Secondary | ICD-10-CM

## 2018-03-26 DIAGNOSIS — Z9181 History of falling: Secondary | ICD-10-CM

## 2018-03-26 DIAGNOSIS — M545 Low back pain, unspecified: Secondary | ICD-10-CM

## 2018-03-26 NOTE — Therapy (Signed)
[facility] [address] [place], [place], [address] Phone: [phone]   Fax:  [phone]  Physical Therapy Treatment  Patient Details  Name: [name] MRN: [id number] Date of Birth: 1936-07-06 Referring Provider: Jovita Gamma, MD   Encounter Date: 03/26/2018  PT End of Session - 03/26/18 1012    Visit Number  24    Date for PT Re-Evaluation  04/02/18    Authorization Type  [employer] Medicare     Authorization Time Period  02/02/18 to 03/04/18, NEW: 03/05/18 to 04/02/18    Authorization - Visit Number  8    Authorization - Number of Visits  10    PT Start Time  0175    PT Stop Time  1025    PT Time Calculation (min)  43 min    Activity Tolerance  Patient tolerated treatment well;No increased pain    Behavior During Therapy  WFL for tasks assessed/performed       Past Medical History:  Diagnosis Date  . Anxiety   . Colon polyps   . Complication of anesthesia    takes longer to wake up- admitted overnight after colonoscopy  . Depression   . GERD (gastroesophageal reflux disease)   . Glaucoma   . Headache    migraines  . Hx: UTI (urinary tract infection)   . Hypercholesteremia   . Hypertension   . Lichen simplex chronicus   . Lumbar stenosis with neurogenic claudication   . Macular degeneration   . Osteoarthritis   . Osteopenia    of the hip  . PONV (postoperative nausea and vomiting)   . Stress incontinence, female   . Unsteady gait   . Urinary urgency   . Wears glasses   . Whooping cough    as a baby    Past Surgical History:  Procedure Laterality Date  . ABDOMINAL HYSTERECTOMY     ovaries remain  . ANTERIOR LAT LUMBAR FUSION N/A 10/26/2017   Procedure: LUMBAR THREE- LUMBAR FOUR, LUMBAR FOUR- LUMBAR FIVE ANTEROLATERAL LUMBAR INTERBODY ARTHRODESIS;  Surgeon: Jovita Gamma, MD;  Location: [place];  Service: Neurosurgery;  Laterality: N/A;  LUMBAR 3- LUMBAR 4, LUMBAR 4- LUMBAR 5 ANTEROLATERAL  LUMBAR INTERBODY ARTHRODESIS, LUMBAR 3- LUMBAR 4, LUMBAR 4- LUMBAR 5 PERCUTANEOUS PEDICLE SCREW FIXATION  . APPENDECTOMY    . BLADDER REPAIR     x two  . CHOLECYSTECTOMY    . COLONOSCOPY    . EYE SURGERY  11/14, 12/14   Macular Dengeneration, Glaucoma  . EYE SURGERY  2016   left eye  . LESION REMOVAL Right 06/28/2013   Procedure: EXCISION 3 cm right labial sebaceous cyst;  Surgeon: Lyman Speller, MD;  Location: [facility];  Service: Gynecology;  Laterality: Right;  . LUMBAR LAMINECTOMY/DECOMPRESSION MICRODISCECTOMY Right 03/20/2016   Procedure: Right - Lumbar four-five lumbar laminotomy, foraminotomy, and possible microdiscectomy;  Surgeon: Jovita Gamma, MD;  Location: [place] [facility] [facility];  Service: Neurosurgery;  Laterality: Right;  right  . LUMBAR PERCUTANEOUS PEDICLE SCREW 2 LEVEL  10/26/2017   Procedure: LUMBAR THREE- LUMBAR FOUR, LUMBAR FOUR- LUMBAR FIVE PERCUTANEOUS PEDICLE SCREW FIXATION;  Surgeon: Jovita Gamma, MD;  Location: [place];  Service: Neurosurgery;;  . MOHS SURGERY     procedure to remove basal cell  . TONSILLECTOMY AND ADENOIDECTOMY    . URETHRAL SLING  2007  . URETHRAL SLING  1/12   midurethral   . vertebroplasty secondary to traumatic compression fracture      There  were no vitals filed for this visit.  Subjective Assessment - 03/26/18 0933    Subjective  Pt states that she was able to walk the halls yesterday at the hospital and was tired by the end of the day. HEP is going well.     Patient Stated Goals  improve Rt leg strength    Currently in Pain?  No/denies    Pain Onset  More than a month ago                       [facility] Adult PT Treatment/Exercise - 03/26/18 0001      Lumbar Exercises: Aerobic   Nustep  L1 x7 min PT present to discuss progress, LE endurance       Lumbar Exercises: Seated   Other Seated Lumbar Exercises  Rt and Lt ankle 4-way with yellow TB x15 reps (heavy cuing for technique)       Lumbar Exercises: Supine   Straight  Leg Raise  10 reps    Straight Leg Raises Limitations  x2 sets each side           Balance Exercises - 03/26/18 0954      Balance Exercises: Standing   Other Standing Exercises  standing weight shift Lt/Rt and tandem each LE forward x30 sec each; walking x15 ft x4 trials without AD and CGA        PT Education - 03/26/18 1012    Education provided  Yes    Education Details  technique with therex     Person(s) Educated  Patient    Methods  Explanation;Verbal cues;Tactile cues    Comprehension  Verbalized understanding       PT Short Term Goals - 02/25/18 1049      PT SHORT TERM GOAL #1   Title  verbalize understanding of posture/body mechanics to decrease risk of reinjury    Status  Achieved      PT SHORT TERM GOAL #2   Title  improve functional strength by demonstrating ability to stand without UE support at least 3/5 trials    Status  Achieved      PT SHORT TERM GOAL #3   Title  improve timed up and go to < 20 sec for improved functional mobility    Baseline  9 sec     Period  Weeks    Status  Achieved        PT Long Term Goals - 02/25/18 1049      PT LONG TERM GOAL #1   Title  pt will be independent with an advanced HEP to allow for maintenance of her strength, ROM and endurance after discharge from PT.     Status  On-going      PT LONG TERM GOAL #2   Title  demonstrate improved functional strength by performing 5x STS in < 20 sec withou UE support    Baseline  9 sec    Status  Achieved      PT LONG TERM GOAL #3   Title  Pt will have an improvement of atleast 6 points on the BERG balance test, to reflect a clinically significant improvement in her balance and decrease her risk of falling and causing injury.     Baseline  32/56    Time  4    Period  Weeks    Status  Revised    Target Date  04/02/18      PT LONG TERM GOAL #4  Title  amb > 250' with LRAD on various indoor/outdoor surfaces modified independently for improved function    Baseline  using  SPC and close supervision    Status  On-going      PT LONG TERM GOAL #5   Title  report pain < 4/10 with activity for improved functional mobility    Baseline  reports not having any pain with daily activity     Status  Achieved      PT LONG TERM GOAL #6   Title  Pt will demo improved Rt hip strength to atleast 4/5 MMT which will improve her safety with ambulation and other activity throughout the day.    Time  4    Period  Weeks    Status  New    Target Date  04/02/18            Plan - 03/26/18 1013    Clinical Impression Statement  Today's session focused on therex to improve ankle strength and stability during balance activity. Pt has significant weakness of B ankles and required therapist cuing for proper technique. Pt had reports of increased fatigue following a busy day yesterday, so she was closely monitored during balance activity. Pt was able to ambulate without her AD, and CGA to ensure that she was taking big steps. Ended with Nustep for LE endurance and pt reported no increase in low back pain following this.     Rehab Potential  Good    PT Frequency  2x / week    PT Duration  4 weeks    PT Treatment/Interventions  ADLs/Self Care Home Management;Cryotherapy;Electrical Stimulation;Moist Heat;Traction;Therapeutic exercise;Therapeutic activities;Functional mobility training;Stair training;Gait training;DME Instruction;Ultrasound;Balance training;Neuromuscular re-education;Patient/family education;Manual techniques;Taping;Dry needling    PT Next Visit Plan  f/u on sidestep with yellow TB and heel raises Rt side; ankle work; sidelying hip abduction and sidelying clam (assess pain response); supine abdominal strengthening; balance activity    PT Home Exercise Plan  Access Code: [email or web address]    Consulted and Agree with Plan of Care  Patient       Patient will benefit from skilled therapeutic intervention in order to improve the following deficits and impairments:  Abnormal gait,  Decreased balance, Decreased cognition, Decreased knowledge of use of DME, Decreased mobility, Decreased safety awareness, Impaired flexibility, Postural dysfunction, Improper body mechanics, Impaired perceived functional ability, Difficulty walking, Increased muscle spasms, Pain, Increased fascial restricitons, Decreased strength, Decreased endurance  Visit Diagnosis: Unsteadiness on feet  History of falling  Muscle weakness (generalized)  Chronic right-sided low back pain without sciatica     Problem List Patient Active Problem List   Diagnosis Date Noted  . Chest pain 01/24/2018  . Liver lesion 11/10/2017  . Nausea & vomiting 10/31/2017  . Chronic daily headache 07/08/2017  . Memory changes 03/06/2017  . Lumbar stenosis with neurogenic claudication 03/20/2016  . Scalp pain 10/22/2015  . Post-traumatic headache 10/11/2015  . Routine health maintenance 03/06/2013  . Macular degeneration of right eye 03/03/2013  . Glaucoma 03/03/2013  . Hyperlipidemia 03/14/2009  . Depression 10/19/2007  . Essential hypertension 10/19/2007  . GASTROESOPHAGEAL REFLUX DISEASE 10/19/2007  . Osteoarthritis 10/19/2007  . STRESS INCONTINENCE 10/19/2007  . Allergic rhinitis 10/19/2007     10:19 AM,03/26/18 Sherol Dade PT, DPT [facility] [place]  [facility] [address], [place], [place], [address] Phone: [phone]   Fax:  [phone]  Name: [name] MRN: [id number] Date of  Birth: Nov 16, 1935

## 2018-03-30 ENCOUNTER — Ambulatory Visit: Payer: Medicare Other | Admitting: Family Medicine

## 2018-03-30 ENCOUNTER — Ambulatory Visit: Payer: Medicare Other | Admitting: Physical Therapy

## 2018-03-30 ENCOUNTER — Encounter: Payer: Self-pay | Admitting: Physical Therapy

## 2018-03-30 DIAGNOSIS — R2681 Unsteadiness on feet: Secondary | ICD-10-CM | POA: Diagnosis not present

## 2018-03-30 DIAGNOSIS — M545 Low back pain: Secondary | ICD-10-CM

## 2018-03-30 DIAGNOSIS — Z9181 History of falling: Secondary | ICD-10-CM

## 2018-03-30 DIAGNOSIS — M6281 Muscle weakness (generalized): Secondary | ICD-10-CM

## 2018-03-30 DIAGNOSIS — G8929 Other chronic pain: Secondary | ICD-10-CM

## 2018-03-30 NOTE — Therapy (Signed)
[facility] [address], [place], [place], [address] Phone: [phone]   Fax:  [phone]  Physical Therapy Treatment  Patient Details  Name: [name] MRN: [id number] Date of Birth: 1936/03/17 Referring Provider: Jovita Gamma, MD   Encounter Date: 03/30/2018  PT End of Session - 03/30/18 1149    Visit Number  25    Date for PT Re-Evaluation  04/02/18    Authorization Type  [employer] Medicare     Authorization Time Period  02/02/18 to 03/04/18, NEW: 03/05/18 to 04/02/18    Authorization - Visit Number  9    Authorization - Number of Visits  10    PT Start Time  7893    PT Stop Time  1223    PT Time Calculation (min)  42 min    Activity Tolerance  Patient tolerated treatment well;No increased pain    Behavior During Therapy  WFL for tasks assessed/performed       Past Medical History:  Diagnosis Date  . Anxiety   . Colon polyps   . Complication of anesthesia    takes longer to wake up- admitted overnight after colonoscopy  . Depression   . GERD (gastroesophageal reflux disease)   . Glaucoma   . Headache    migraines  . Hx: UTI (urinary tract infection)   . Hypercholesteremia   . Hypertension   . Lichen simplex chronicus   . Lumbar stenosis with neurogenic claudication   . Macular degeneration   . Osteoarthritis   . Osteopenia    of the hip  . PONV (postoperative nausea and vomiting)   . Stress incontinence, female   . Unsteady gait   . Urinary urgency   . Wears glasses   . Whooping cough    as a baby    Past Surgical History:  Procedure Laterality Date  . ABDOMINAL HYSTERECTOMY     ovaries remain  . ANTERIOR LAT LUMBAR FUSION N/A 10/26/2017   Procedure: LUMBAR THREE- LUMBAR FOUR, LUMBAR FOUR- LUMBAR FIVE ANTEROLATERAL LUMBAR INTERBODY ARTHRODESIS;  Surgeon: Jovita Gamma, MD;  Location: [facility];  Service: Neurosurgery;  Laterality: N/A;  LUMBAR 3- LUMBAR 4, LUMBAR 4- LUMBAR 5 ANTEROLATERAL  LUMBAR INTERBODY ARTHRODESIS, LUMBAR 3- LUMBAR 4, LUMBAR 4- LUMBAR 5 PERCUTANEOUS PEDICLE SCREW FIXATION  . APPENDECTOMY    . BLADDER REPAIR     x two  . CHOLECYSTECTOMY    . COLONOSCOPY    . EYE SURGERY  11/14, 12/14   Macular Dengeneration, Glaucoma  . EYE SURGERY  2016   left eye  . LESION REMOVAL Right 06/28/2013   Procedure: EXCISION 3 cm right labial sebaceous cyst;  Surgeon: Lyman Speller, MD;  Location: [facility];  Service: Gynecology;  Laterality: Right;  . LUMBAR LAMINECTOMY/DECOMPRESSION MICRODISCECTOMY Right 03/20/2016   Procedure: Right - Lumbar four-five lumbar laminotomy, foraminotomy, and possible microdiscectomy;  Surgeon: Jovita Gamma, MD;  Location: [facility];  Service: Neurosurgery;  Laterality: Right;  right  . LUMBAR PERCUTANEOUS PEDICLE SCREW 2 LEVEL  10/26/2017   Procedure: LUMBAR THREE- LUMBAR FOUR, LUMBAR FOUR- LUMBAR FIVE PERCUTANEOUS PEDICLE SCREW FIXATION;  Surgeon: Jovita Gamma, MD;  Location: [facility];  Service: Neurosurgery;;  . MOHS SURGERY     procedure to remove basal cell  . TONSILLECTOMY AND ADENOIDECTOMY    . URETHRAL SLING  2007  . URETHRAL SLING  1/12   midurethral   . vertebroplasty secondary to traumatic compression fracture      There  were no vitals filed for this visit.  Subjective Assessment - 03/30/18 1148    Subjective  Pt reports that she does not have her walker because it's in her husband's car    Patient Stated Goals  improve Rt leg strength    Currently in Pain?  Yes    Pain Score  3     Pain Location  Back    Pain Orientation  Right;Left;Lower    Pain Descriptors / Indicators  Aching    Pain Type  Chronic pain    Pain Radiating Towards  none     Pain Onset  More than a month ago    Pain Frequency  Constant    Aggravating Factors   alot of activity    Pain Relieving Factors  rest and massage                       [facility] Adult PT Treatment/Exercise - 03/30/18 0001      Lumbar Exercises: Aerobic    Nustep  L1 x5 min PT present to discuss progress      Knee/Hip Exercises: Standing   Heel Raises  2 sets;15 reps    Heel Raises Limitations  weight shift Rt     Hip Abduction  Stengthening;2 sets;10 reps;Both    Abduction Limitations  sidestepping with UE support     Forward Step Up  Both;10 reps;Hand Hold: 1;Hand Hold: 2;Step Height: 4";1 set    Other Standing Knee Exercises  squat with BUE support x10 reps       Knee/Hip Exercises: Seated   Other Seated Knee/Hip Exercises  seated ankle circles x10 reps each          Balance Exercises - 03/30/18 1155      Balance Exercises: Standing   Tandem Stance  Eyes open;2 reps;30 secs;Limitations   Rt 26 sec, Lt 17 sec   Tandem Gait  Forward;2 reps   25 ft each, SPC   Sidestepping  2 reps   Lt and Rt x25 ft each       PT Education - 03/30/18 1224    Education provided  Yes    Education Details  technique with therex    Person(s) Educated  Patient    Methods  Explanation;Verbal cues;Tactile cues    Comprehension  Verbalized understanding;Returned demonstration       PT Short Term Goals - 02/25/18 1049      PT SHORT TERM GOAL #1   Title  verbalize understanding of posture/body mechanics to decrease risk of reinjury    Status  Achieved      PT SHORT TERM GOAL #2   Title  improve functional strength by demonstrating ability to stand without UE support at least 3/5 trials    Status  Achieved      PT SHORT TERM GOAL #3   Title  improve timed up and go to < 20 sec for improved functional mobility    Baseline  9 sec     Period  Weeks    Status  Achieved        PT Long Term Goals - 02/25/18 1049      PT LONG TERM GOAL #1   Title  pt will be independent with an advanced HEP to allow for maintenance of her strength, ROM and endurance after discharge from PT.     Status  On-going      PT LONG TERM GOAL #2   Title  demonstrate  improved functional strength by performing 5x STS in < 20 sec withou UE support    Baseline  9  sec    Status  Achieved      PT LONG TERM GOAL #3   Title  Pt will have an improvement of atleast 6 points on the BERG balance test, to reflect a clinically significant improvement in her balance and decrease her risk of falling and causing injury.     Baseline  32/56    Time  4    Period  Weeks    Status  Revised    Target Date  04/02/18      PT LONG TERM GOAL #4   Title  amb > 250' with LRAD on various indoor/outdoor surfaces modified independently for improved function    Baseline  using SPC and close supervision    Status  On-going      PT LONG TERM GOAL #5   Title  report pain < 4/10 with activity for improved functional mobility    Baseline  reports not having any pain with daily activity     Status  Achieved      PT LONG TERM GOAL #6   Title  Pt will demo improved Rt hip strength to atleast 4/5 MMT which will improve her safety with ambulation and other activity throughout the day.    Time  4    Period  Weeks    Status  New    Target Date  04/02/18            Plan - 03/30/18 1224    Clinical Impression Statement  Pt is making progress towards her goals, demonstrating improve tandem balance this session up to 26 sec with RLE forward and 17 sec with her LLE forward. Pt continues to demonstrate LE fatigue Rt>Lt during exercises and following her session. Pt's POC ends this week, so a re-evaluation will be completed to determine progress towards goals next session.     Rehab Potential  Good    PT Frequency  2x / week    PT Duration  4 weeks    PT Treatment/Interventions  ADLs/Self Care Home Management;Cryotherapy;Electrical Stimulation;Moist Heat;Traction;Therapeutic exercise;Therapeutic activities;Functional mobility training;Stair training;Gait training;DME Instruction;Ultrasound;Balance training;Neuromuscular re-education;Patient/family education;Manual techniques;Taping;Dry needling    PT Next Visit Plan  re-evaluation, update HEP possible d/c if lack of progress     PT Home Exercise Plan  Access Code: [email or web address]    Consulted and Agree with Plan of Care  Patient       Patient will benefit from skilled therapeutic intervention in order to improve the following deficits and impairments:  Abnormal gait, Decreased balance, Decreased cognition, Decreased knowledge of use of DME, Decreased mobility, Decreased safety awareness, Impaired flexibility, Postural dysfunction, Improper body mechanics, Impaired perceived functional ability, Difficulty walking, Increased muscle spasms, Pain, Increased fascial restricitons, Decreased strength, Decreased endurance  Visit Diagnosis: Unsteadiness on feet  History of falling  Muscle weakness (generalized)  Chronic right-sided low back pain without sciatica     Problem List Patient Active Problem List   Diagnosis Date Noted  . Chest pain 01/24/2018  . Liver lesion 11/10/2017  . Nausea & vomiting 10/31/2017  . Chronic daily headache 07/08/2017  . Memory changes 03/06/2017  . Lumbar stenosis with neurogenic claudication 03/20/2016  . Scalp pain 10/22/2015  . Post-traumatic headache 10/11/2015  . Routine health maintenance 03/06/2013  . Macular degeneration of right eye 03/03/2013  . Glaucoma 03/03/2013  . Hyperlipidemia 03/14/2009  . Depression 10/19/2007  .  Essential hypertension 10/19/2007  . GASTROESOPHAGEAL REFLUX DISEASE 10/19/2007  . Osteoarthritis 10/19/2007  . STRESS INCONTINENCE 10/19/2007  . Allergic rhinitis 10/19/2007   12:30 PM,03/30/18 Sherol Dade PT, DPT [place] [facility]  [facility] [address], [place], [place], [address] Phone: [phone]   Fax:  [phone]  Name: [name] MRN: [id number] Date of Birth: 31-Aug-1935

## 2018-03-31 NOTE — Progress Notes (Signed)
Subjective:   [name] is a 82 y.o. female who presents for Medicare Annual (Subsequent) preventive examination.  Reports health as In outpatient rehab for unsteadiness of feet  OV 03/01/2018  2017 [place] and leg hurt  For 2 years; went to see Dr. Durene Cal Lumbar stenosis surgery March 2019 States still has issue with balance on right leg but in PT Sees Dr. Durene Cal in October   Spouse just dx with cancer of bladder 12   Diet BMI 31. 4 Lipids improved  Breakfast- raisin bread toasted with peanut butter Lunch; sandwich; egg salad Supper - cereal  Someone makes them a meal  Spouse still does the shopping  Given some ensure samples   Exercise Walking post op for now secondary to surgery   Health Maintenance Due  Topic Date Due  . INFLUENZA VACCINE  03/11/2018   Flu vaccine not available  Seeing GYN Dr Hale Bogus  Mammogram 12/2017   She has had one shingrix injection and had a reaction Bone density 03/2017 -1,2  One does of shingrix 12/17/2016 - due prior to 06/18/2018        Objective:     Vitals: BP 120/60   Pulse 73   Ht 5' (1.524 m)   Wt 161 lb (73 kg)   LMP 08/11/1974   SpO2 98%   BMI 31.44 kg/m   Body mass index is 31.44 kg/m.  Advanced Directives 01/24/2018 01/24/2018 12/07/2017 10/31/2017 10/31/2017 10/21/2017 03/25/2017  Does Patient Have a Medical Advance Directive? Yes Yes Yes Yes Yes Yes Yes  Type of Advance Directive - [place];Living will [place];Living will Living will Living will [place];Living will -  Does patient want to make changes to medical advance directive? No - Patient declined - - No - Patient declined - No - Patient declined -  Copy of [place] in Chart? - - - - No - copy requested No - copy requested -  Would patient like information on creating a medical advance directive? - - - - No - Patient declined - -    Tobacco Social  History   Tobacco Use  Smoking Status Former Smoker  . Last attempt to quit: 08/11/1965  . Years since quitting: 52.6  Smokeless Tobacco Never Used  Tobacco Comment   has not smoked in 45 years      Counseling given: Yes Comment: has not smoked in 45 years    Clinical Intake:     Past Medical History:  Diagnosis Date  . Anxiety   . Colon polyps   . Complication of anesthesia    takes longer to wake up- admitted overnight after colonoscopy  . Depression   . GERD (gastroesophageal reflux disease)   . Glaucoma   . Headache    migraines  . Hx: UTI (urinary tract infection)   . Hypercholesteremia   . Hypertension   . Lichen simplex chronicus   . Lumbar stenosis with neurogenic claudication   . Macular degeneration   . Osteoarthritis   . Osteopenia    of the hip  . PONV (postoperative nausea and vomiting)   . Stress incontinence, female   . Unsteady gait   . Urinary urgency   . Wears glasses   . Whooping cough    as a baby   Past Surgical History:  Procedure Laterality Date  . ABDOMINAL HYSTERECTOMY     ovaries remain  . ANTERIOR LAT LUMBAR  FUSION N/A 10/26/2017   Procedure: LUMBAR THREE- LUMBAR FOUR, LUMBAR FOUR- LUMBAR FIVE ANTEROLATERAL LUMBAR INTERBODY ARTHRODESIS;  Surgeon: Jovita Gamma, MD;  Location: [facility];  Service: Neurosurgery;  Laterality: N/A;  LUMBAR 3- LUMBAR 4, LUMBAR 4- LUMBAR 5 ANTEROLATERAL LUMBAR INTERBODY ARTHRODESIS, LUMBAR 3- LUMBAR 4, LUMBAR 4- LUMBAR 5 PERCUTANEOUS PEDICLE SCREW FIXATION  . APPENDECTOMY    . BLADDER REPAIR     x two  . CHOLECYSTECTOMY    . COLONOSCOPY    . EYE SURGERY  11/14, 12/14   Macular Dengeneration, Glaucoma  . EYE SURGERY  2016   left eye  . LESION REMOVAL Right 06/28/2013   Procedure: EXCISION 3 cm right labial sebaceous cyst;  Surgeon: Lyman Speller, MD;  Location: [facility];  Service: Gynecology;  Laterality: Right;  . LUMBAR LAMINECTOMY/DECOMPRESSION MICRODISCECTOMY Right 03/20/2016   Procedure: Right -  Lumbar four-five lumbar laminotomy, foraminotomy, and possible microdiscectomy;  Surgeon: Jovita Gamma, MD;  Location: [facility];  Service: Neurosurgery;  Laterality: Right;  right  . LUMBAR PERCUTANEOUS PEDICLE SCREW 2 LEVEL  10/26/2017   Procedure: LUMBAR THREE- LUMBAR FOUR, LUMBAR FOUR- LUMBAR FIVE PERCUTANEOUS PEDICLE SCREW FIXATION;  Surgeon: Jovita Gamma, MD;  Location: [facility];  Service: Neurosurgery;;  . MOHS SURGERY     procedure to remove basal cell  . TONSILLECTOMY AND ADENOIDECTOMY    . URETHRAL SLING  2007  . URETHRAL SLING  1/12   midurethral   . vertebroplasty secondary to traumatic compression fracture     Family History  Problem Relation Age of Onset  . Congestive Heart Failure Mother   . Thyroid disease Mother   . Osteoporosis Mother   . Alcoholism Mother   . Prostate cancer Father   . Breast cancer Maternal Grandmother   . Hypertension Sister   . Thyroid disease Sister   . Cancer Sister        brain ?  Marland Kitchen Rheum arthritis Daughter    Social History   Socioeconomic History  . Marital status: Married    Spouse name: Not on file  . Number of children: 2  . Years of education: [employer]  . Highest education level: Not on file  Occupational History  . Occupation: Retired  Scientific laboratory technician  . Financial resource strain: Not on file  . Food insecurity:    Worry: Not on file    Inability: Not on file  . Transportation needs:    Medical: Not on file    Non-medical: Not on file  Tobacco Use  . Smoking status: Former Smoker    Last attempt to quit: 08/11/1965    Years since quitting: 52.6  . Smokeless tobacco: Never Used  . Tobacco comment: has not smoked in 45 years   Substance and Sexual Activity  . Alcohol use: Yes    Alcohol/week: 1.0 standard drinks    Types: 1 Standard drinks or equivalent per week    Comment: occ   . Drug use: No  . Sexual activity: Not Currently    Partners: Male    Birth control/protection: Surgical    Comment: TAH  Lifestyle   . Physical activity:    Days per week: Not on file    Minutes per session: Not on file  . Stress: Not on file  Relationships  . Social connections:    Talks on phone: Not on file    Gets together: Not on file    Attends religious service: Not on file    Active  member of club or organization: Not on file    Attends meetings of clubs or organizations: Not on file    Relationship status: Not on file  Other Topics Concern  . Not on file  Social History Narrative   HSG, attended [employer]   Married '65   1 son '67, 1 Daughter '69; 5 grandchildren. 1 grandson dealing with drugs after divorce of parents. Oldest.    Work: retired Pharmacist, hospital   Marriage in good health   Former smoker   Right-handed   Caffeine: 11/2 cups per day    Outpatient Encounter Medications as of 04/01/2018  Medication Sig  . acetaminophen (TYLENOL) 500 MG tablet Take 500 mg by mouth daily as needed for mild pain or headache.   . ALPRAZolam (XANAX) 0.5 MG tablet Take 0.5-1 tablets (0.25-0.5 mg total) by mouth 2 (two) times daily as needed. for anxiety  . brimonidine-timolol (COMBIGAN) 0.2-0.5 % ophthalmic solution Place 1 drop into both eyes every 12 (twelve) hours.  Marland Kitchen ezetimibe-simvastatin (VYTORIN) 10-20 MG tablet Take 1 tablet by mouth daily.  Marland Kitchen ibuprofen (ADVIL,MOTRIN) 200 MG tablet Take 200 mg by mouth every 8 (eight) hours as needed.  Marland Kitchen ketoconazole (NIZORAL) 2 % cream Apply 1 application topically daily. For circular area on left lower leg  . nadolol (CORGARD) 40 MG tablet Take 1 tablet (40 mg total) by mouth at bedtime.  Marland Kitchen omeprazole (PRILOSEC) 20 MG capsule Take 20 mg by mouth daily before breakfast.  . sodium chloride (OCEAN) 0.65 % SOLN nasal spray Place 1 spray into both nostrils 2 (two) times daily as needed for congestion.  . traZODone (DESYREL) 100 MG tablet TAKE 1/4 TO 1/2 TABLET BY MOUTH AT BEDTIME IF NEEDED FOR SLEEP  . triamcinolone ointment (KENALOG) 0.5 % Apply 1 application topically 2 (two)  times daily.  Marland Kitchen venlafaxine XR (EFFEXOR-XR) 150 MG 24 hr capsule TAKE 1 CAPSULE BY MOUTH TWICE A DAY  . azelastine (ASTELIN) 0.1 % nasal spray Place 1 spray into both nostrils 2 (two) times daily. Use in each nostril as directed (Patient not taking: Reported on 04/01/2018)   No facility-administered encounter medications on file as of 04/01/2018.     Activities of Daily Living In your present state of health, do you have any difficulty performing the following activities: 01/24/2018 10/31/2017  Hearing? N N  Vision? N N  Difficulty concentrating or making decisions? N N  Walking or climbing stairs? Y Y  Dressing or bathing? N Y  Doing errands, shopping? N N  Some recent data might be hidden    Patient Care Team: Marin Olp, MD as PCP - General (Family Medicine) Sherren Mocha, MD as PCP - Cardiology (Cardiology)    Assessment:   This is a routine wellness examination for [name].  Exercise Activities and Dietary recommendations    Goals    . weight     Please get a good variety of colorful food   Watches her portions; eating less   Fat free or low fat dairy products Fish high in omega-3 acids ( salmon, tuna, trout) Fruits, such as apples, bananas, oranges, pears, prunes Legumes, such as kidney beans, lentils, checkpeas, black-eyed peas and lima beans Vegetables; broccoli, cabbage, carrots Whole grains;   Plant fats are better; decrease "white" foods as pasta, rice, bread and desserts, sugar; Avoid red meat (limiting) palm and coconut oils; sugary foods and beverages  Two nutrients that raise blood chol levels are saturated fats and trans fat; in hydrogenated oils  and fats, as stick margarine, baked goods (cookes, cakes, pies, crackers; frosting; and coffee creamers;   Some Fats lower cholesterol: Monounsaturated and polyunsaturated  Avocados Corn, sunflower, and soybean oils Nuts and seeds, such as walnuts Olive, canola, peanut, safflower, and sesame oils Peanut  butter Salmon and trout Tofu          Fall Risk Fall Risk  03/25/2017 03/06/2017 04/04/2016 10/08/2015  Falls in the past year? Yes Yes Yes Yes  Number falls in past yr: 2 or more 2 or more 2 or more 2 or more  Injury with Fall? - No Yes Yes  Follow up Education provided - - -     Depression Screen PHQ 2/9 Scores 02/17/2018 03/25/2017 03/06/2017 04/04/2016  PHQ - 2 Score 0 0 0 0  PHQ- 9 Score 0 - - -    Has hx of depression but not in the last 2 weeks  Cognitive Function MMSE - Mini Mental State Exam 03/25/2017  Orientation to time 5  Orientation to Place 5  Registration 3  Attention/ Calculation 5  Recall 3  Language- name 2 objects 2  Language- follow 3 step command 3  Language- read & follow direction 1  Write a sentence 1  Copy design 1        Immunization History  Administered Date(s) Administered  . Influenza Whole 07/18/2004, 04/25/2010, 04/28/2010  . Influenza, High Dose Seasonal PF 08/26/2013, 04/19/2014, 04/04/2016  . Influenza-Unspecified 05/11/2012, 04/03/2015, 04/13/2017  . Pneumococcal Conjugate-13 10/08/2015  . Pneumococcal Polysaccharide-23 08/11/2006  . Td 03/09/2008  . Tdap 04/03/2015  . Zoster 08/11/2009  . Zoster Recombinat (Shingrix) 12/17/2016      Screening Tests Health Maintenance  Topic Date Due  . INFLUENZA VACCINE  03/11/2018  . TETANUS/TDAP  04/02/2025  . DEXA SCAN  Completed  . PNA vac Low Risk Adult  Completed        Plan:      PCP Notes   Health Maintenance Flu vaccine not available   Mammogram 12/2017   She has had one shingrix injection and had a reaction Bone density 03/2017 -1,2  Seeing GYN Dr Hale Bogus / is following dexa  Instructed on taking 1200 of calcium and vit d 1000    Abnormal Screens  none  Referrals  She is a self referral to dermatology and will make an apt  Patient concerns; Has areas of concern, rash like circular area, 1 cm;  One circular area on top of ankle on left foot One area  inner ankle right foot. Will make an apt with Dermatologist  No redness or irritation; just a circle approx 1-2 cm noted occurring in 2 spots    Wants to get off walker; continues PT with good compliance   Spouse as heart issues and bladder cancer at the same time  She is still recuperating from back surgery   Nurse Concerns; Allergic reaction to the shingrix -   Next PCP apt     I have personally reviewed and noted the following in the patient's chart:   . Medical and social history . Use of alcohol, tobacco or illicit drugs  . Current medications and supplements . Functional ability and status . Nutritional status . Physical activity . Advanced directives . List of other physicians . Hospitalizations, surgeries, and ER visits in previous 12 months . Vitals . Screenings to include cognitive, depression, and falls . Referrals and appointments  In addition, I have reviewed and discussed with patient certain preventive protocols,  quality metrics, and best practice recommendations. A written personalized care plan for preventive services as well as general preventive health recommendations were provided to patient.     Gabrielle Fines, RN  04/01/2018

## 2018-04-01 ENCOUNTER — Ambulatory Visit (INDEPENDENT_AMBULATORY_CARE_PROVIDER_SITE_OTHER): Payer: Medicare Other

## 2018-04-01 ENCOUNTER — Ambulatory Visit: Payer: Medicare Other | Admitting: Physical Therapy

## 2018-04-01 ENCOUNTER — Encounter: Payer: Self-pay | Admitting: Physical Therapy

## 2018-04-01 VITALS — BP 120/60 | HR 73 | Ht 60.0 in | Wt 161.0 lb

## 2018-04-01 DIAGNOSIS — M545 Low back pain, unspecified: Secondary | ICD-10-CM

## 2018-04-01 DIAGNOSIS — Z0000 Encounter for general adult medical examination without abnormal findings: Secondary | ICD-10-CM | POA: Diagnosis not present

## 2018-04-01 DIAGNOSIS — Z9181 History of falling: Secondary | ICD-10-CM

## 2018-04-01 DIAGNOSIS — R2681 Unsteadiness on feet: Secondary | ICD-10-CM | POA: Diagnosis not present

## 2018-04-01 DIAGNOSIS — G8929 Other chronic pain: Secondary | ICD-10-CM

## 2018-04-01 DIAGNOSIS — M6281 Muscle weakness (generalized): Secondary | ICD-10-CM

## 2018-04-01 NOTE — Progress Notes (Signed)
I have reviewed and agree with note, evaluation, plan. Glad she has plans to follow up with dermatology to evaluate rash  Garret Reddish, MD

## 2018-04-01 NOTE — Patient Instructions (Addendum)
[name] , Thank you for taking time to come for your Medicare Wellness Visit. I appreciate your ongoing commitment to your health goals. Please review the following plan we discussed and let me know if I can assist you in the future.   For osteopenia; mild bone loss Calcium 123m with Vit D 800u per day; more as directed by physician Strength building exercises discussed; can include walking; housework; small weights or stretch bands; [facility]  Please visit the osteoporosis [email or web address] for up to date recommendations    These are the goals we discussed: Goals    . weight     Please get a good variety of colorful food   Watches her portions; eating less   Fat free or low fat dairy products Fish high in omega-3 acids ( salmon, tuna, trout) Fruits, such as apples, bananas, oranges, pears, prunes Legumes, such as kidney beans, lentils, checkpeas, black-eyed peas and lima beans Vegetables; broccoli, cabbage, carrots Whole grains;   Plant fats are better; decrease "white" foods as pasta, rice, bread and desserts, sugar; Avoid red meat (limiting) palm and coconut oils; sugary foods and beverages  Two nutrients that raise blood chol levels are saturated fats and trans fat; in hydrogenated oils and fats, as stick margarine, baked goods (cookes, cakes, pies, crackers; frosting; and coffee creamers;   Some Fats lower cholesterol: Monounsaturated and polyunsaturated  Avocados Corn, sunflower, and soybean oils Nuts and seeds, such as walnuts Olive, canola, peanut, safflower, and sesame oils Peanut butter Salmon and trout Tofu          This is a list of the screening recommended for you and due dates:  Health Maintenance  Topic Date Due  . Flu Shot  03/11/2018  . Tetanus Vaccine  04/02/2025  . DEXA scan (bone density measurement)  Completed  . Pneumonia vaccines  Completed      Fall Prevention in the Home Falls can cause injuries. They can happen  to people of all ages. There are many things you can do to make your home safe and to help prevent falls. What can I do on the outside of my home?  Regularly fix the edges of walkways and driveways and fix any cracks.  Remove anything that might make you trip as you walk through a door, such as a raised step or threshold.  Trim any bushes or trees on the path to your home.  Use bright outdoor lighting.  Clear any walking paths of anything that might make someone trip, such as rocks or tools.  Regularly check to see if handrails are loose or broken. Make sure that both sides of any steps have handrails.  Any raised decks and porches should have guardrails on the edges.  Have any leaves, snow, or ice cleared regularly.  Use sand or salt on walking paths during winter.  Clean up any spills in your garage right away. This includes oil or grease spills. What can I do in the bathroom?  Use night lights.  Install grab bars by the toilet and in the tub and shower. Do not use towel bars as grab bars.  Use non-skid mats or decals in the tub or shower.  If you need to sit down in the shower, use a plastic, non-slip stool.  Keep the floor dry. Clean up any water that spills on the floor as soon as it happens.  Remove soap buildup in the tub or shower regularly.  Attach bath mats  securely with double-sided non-slip rug tape.  Do not have throw rugs and other things on the floor that can make you trip. What can I do in the bedroom?  Use night lights.  Make sure that you have a light by your bed that is easy to reach.  Do not use any sheets or blankets that are too big for your bed. They should not hang down onto the floor.  Have a firm chair that has side arms. You can use this for support while you get dressed.  Do not have throw rugs and other things on the floor that can make you trip. What can I do in the kitchen?  Clean up any spills right away.  Avoid walking on wet  floors.  Keep items that you use a lot in easy-to-reach places.  If you need to reach something above you, use a strong step stool that has a grab bar.  Keep electrical cords out of the way.  Do not use floor polish or wax that makes floors slippery. If you must use wax, use non-skid floor wax.  Do not have throw rugs and other things on the floor that can make you trip. What can I do with my stairs?  Do not leave any items on the stairs.  Make sure that there are handrails on both sides of the stairs and use them. Fix handrails that are broken or loose. Make sure that handrails are as long as the stairways.  Check any carpeting to make sure that it is firmly attached to the stairs. Fix any carpet that is loose or worn.  Avoid having throw rugs at the top or bottom of the stairs. If you do have throw rugs, attach them to the floor with carpet tape.  Make sure that you have a light switch at the top of the stairs and the bottom of the stairs. If you do not have them, ask someone to add them for you. What else can I do to help prevent falls?  Wear shoes that: ? Do not have high heels. ? Have rubber bottoms. ? Are comfortable and fit you well. ? Are closed at the toe. Do not wear sandals.  If you use a stepladder: ? Make sure that it is fully opened. Do not climb a closed stepladder. ? Make sure that both sides of the stepladder are locked into place. ? Ask someone to hold it for you, if possible.  Clearly mark and make sure that you can see: ? Any grab bars or handrails. ? First and last steps. ? Where the edge of each step is.  Use tools that help you move around (mobility aids) if they are needed. These include: ? Canes. ? Walkers. ? Scooters. ? Crutches.  Turn on the lights when you go into a dark area. Replace any light bulbs as soon as they burn out.  Set up your furniture so you have a clear path. Avoid moving your furniture around.  If any of your floors are  uneven, fix them.  If there are any pets around you, be aware of where they are.  Review your medicines with your doctor. Some medicines can make you feel dizzy. This can increase your chance of falling. Ask your doctor what other things that you can do to help prevent falls. This information is not intended to replace advice given to you by your health care provider. Make sure you discuss any questions you have with your health  care provider. Document Released: 05/24/2009 Document Revised: 01/03/2016 Document Reviewed: 09/01/2014 Elsevier Interactive Patient Education  2018 [place] Maintenance, Female Adopting a healthy lifestyle and getting preventive care can go a long way to promote health and wellness. Talk with your health care provider about what schedule of regular examinations is right for you. This is a good chance for you to check in with your provider about disease prevention and staying healthy. In between checkups, there are plenty of things you can do on your own. Experts have done a lot of research about which lifestyle changes and preventive measures are most likely to keep you healthy. Ask your health care provider for more information. Weight and diet Eat a healthy diet  Be sure to include plenty of vegetables, fruits, low-fat dairy products, and lean protein.  Do not eat a lot of foods high in solid fats, added sugars, or salt.  Get regular exercise. This is one of the most important things you can do for your health. ? Most adults should exercise for at least 150 minutes each week. The exercise should increase your heart rate and make you sweat (moderate-intensity exercise). ? Most adults should also do strengthening exercises at least twice a week. This is in addition to the moderate-intensity exercise.  Maintain a healthy weight  Body mass index (BMI) is a measurement that can be used to identify possible weight problems. It estimates body fat based on  height and weight. Your health care provider can help determine your BMI and help you achieve or maintain a healthy weight.  For females 61 years of age and older: ? A BMI below 18.5 is considered underweight. ? A BMI of 18.5 to 24.9 is normal. ? A BMI of 25 to 29.9 is considered overweight. ? A BMI of 30 and above is considered obese.  Watch levels of cholesterol and blood lipids  You should start having your blood tested for lipids and cholesterol at 80 years of age, then have this test every 5 years.  You may need to have your cholesterol levels checked more often if: ? Your lipid or cholesterol levels are high. ? You are older than 80 years of age. ? You are at high risk for heart disease.  Cancer screening Lung Cancer  Lung cancer screening is recommended for adults 103-56 years old who are at high risk for lung cancer because of a history of smoking.  A yearly low-dose CT scan of the lungs is recommended for people who: ? Currently smoke. ? Have quit within the past 15 years. ? Have at least a 30-pack-year history of smoking. A pack year is smoking an average of one pack of cigarettes a day for 1 year.  Yearly screening should continue until it has been 15 years since you quit.  Yearly screening should stop if you develop a health problem that would prevent you from having lung cancer treatment.  Breast Cancer  Practice breast self-awareness. This means understanding how your breasts normally appear and feel.  It also means doing regular breast self-exams. Let your health care provider know about any changes, no matter how small.  If you are in your 20s or 30s, you should have a clinical breast exam (CBE) by a health care provider every 1-3 years as part of a regular health exam.  If you are 68 or older, have a CBE every year. Also consider having a breast X-ray (mammogram) every year.  If you have a family  history of breast cancer, talk to your health care provider  about genetic screening.  If you are at high risk for breast cancer, talk to your health care provider about having an MRI and a mammogram every year.  Breast cancer gene (BRCA) assessment is recommended for women who have family members with BRCA-related cancers. BRCA-related cancers include: ? Breast. ? Ovarian. ? Tubal. ? Peritoneal cancers.  Results of the assessment will determine the need for genetic counseling and BRCA1 and BRCA2 testing.  Cervical Cancer Your health care provider may recommend that you be screened regularly for cancer of the pelvic organs (ovaries, uterus, and vagina). This screening involves a pelvic examination, including checking for microscopic changes to the surface of your cervix (Pap test). You may be encouraged to have this screening done every 3 years, beginning at age 54.  For women ages 77-65, health care providers may recommend pelvic exams and Pap testing every 3 years, or they may recommend the Pap and pelvic exam, combined with testing for human papilloma virus (HPV), every 5 years. Some types of HPV increase your risk of cervical cancer. Testing for HPV may also be done on women of any age with unclear Pap test results.  Other health care providers may not recommend any screening for nonpregnant women who are considered low risk for pelvic cancer and who do not have symptoms. Ask your health care provider if a screening pelvic exam is right for you.  If you have had past treatment for cervical cancer or a condition that could lead to cancer, you need Pap tests and screening for cancer for at least 20 years after your treatment. If Pap tests have been discontinued, your risk factors (such as having a new sexual partner) need to be reassessed to determine if screening should resume. Some women have medical problems that increase the chance of getting cervical cancer. In these cases, your health care provider may recommend more frequent screening and Pap  tests.  Colorectal Cancer  This type of cancer can be detected and often prevented.  Routine colorectal cancer screening usually begins at 80 years of age and continues through 80 years of age.  Your health care provider may recommend screening at an earlier age if you have risk factors for colon cancer.  Your health care provider may also recommend using home test kits to check for hidden blood in the stool.  A small camera at the end of a tube can be used to examine your colon directly (sigmoidoscopy or colonoscopy). This is done to check for the earliest forms of colorectal cancer.  Routine screening usually begins at age 40.  Direct examination of the colon should be repeated every 5-10 years through 80 years of age. However, you may need to be screened more often if early forms of precancerous polyps or small growths are found.  Skin Cancer  Check your skin from head to toe regularly.  Tell your health care provider about any new moles or changes in moles, especially if there is a change in a mole's shape or color.  Also tell your health care provider if you have a mole that is larger than the size of a pencil eraser.  Always use sunscreen. Apply sunscreen liberally and repeatedly throughout the day.  Protect yourself by wearing long sleeves, pants, a wide-brimmed hat, and sunglasses whenever you are outside.  Heart disease, diabetes, and high blood pressure  High blood pressure causes heart disease and increases the risk of stroke.  High blood pressure is more likely to develop in: ? People who have blood pressure in the high end of the normal range (130-139/85-89 mm Hg). ? People who are overweight or obese. ? People who are African American.  If you are 37-95 years of age, have your blood pressure checked every 3-5 years. If you are 32 years of age or older, have your blood pressure checked every year. You should have your blood pressure measured twice-once when you are at  a hospital or clinic, and once when you are not at a hospital or clinic. Record the average of the two measurements. To check your blood pressure when you are not at a hospital or clinic, you can use: ? An automated blood pressure machine at a pharmacy. ? A home blood pressure monitor.  If you are between 66 years and 38 years old, ask your health care provider if you should take aspirin to prevent strokes.  Have regular diabetes screenings. This involves taking a blood sample to check your fasting blood sugar level. ? If you are at a normal weight and have a low risk for diabetes, have this test once every three years after 80 years of age. ? If you are overweight and have a high risk for diabetes, consider being tested at a younger age or more often. Preventing infection Hepatitis B  If you have a higher risk for hepatitis B, you should be screened for this virus. You are considered at high risk for hepatitis B if: ? You were born in a country where hepatitis B is common. Ask your health care provider which countries are considered high risk. ? Your parents were born in a high-risk country, and you have not been immunized against hepatitis B (hepatitis B vaccine). ? You have HIV or AIDS. ? You use needles to inject street drugs. ? You live with someone who has hepatitis B. ? You have had sex with someone who has hepatitis B. ? You get hemodialysis treatment. ? You take certain medicines for conditions, including cancer, organ transplantation, and autoimmune conditions.  Hepatitis C  Blood testing is recommended for: ? Everyone born from [birth year] through 1963. ? Anyone with known risk factors for hepatitis C.  Sexually transmitted infections (STIs)  You should be screened for sexually transmitted infections (STIs) including gonorrhea and chlamydia if: ? You are sexually active and are younger than 80 years of age. ? You are older than 80 years of age and your health care provider tells  you that you are at risk for this type of infection. ? Your sexual activity has changed since you were last screened and you are at an increased risk for chlamydia or gonorrhea. Ask your health care provider if you are at risk.  If you do not have HIV, but are at risk, it may be recommended that you take a prescription medicine daily to prevent HIV infection. This is called pre-exposure prophylaxis (PrEP). You are considered at risk if: ? You are sexually active and do not regularly use condoms or know the HIV status of your partner(s). ? You take drugs by injection. ? You are sexually active with a partner who has HIV.  Talk with your health care provider about whether you are at high risk of being infected with HIV. If you choose to begin PrEP, you should first be tested for HIV. You should then be tested every 3 months for as long as you are taking PrEP. Pregnancy  If you  are premenopausal and you may become pregnant, ask your health care provider about preconception counseling.  If you may become pregnant, take 400 to 800 micrograms (mcg) of folic acid every day.  If you want to prevent pregnancy, talk to your health care provider about birth control (contraception). Osteoporosis and menopause  Osteoporosis is a disease in which the bones lose minerals and strength with aging. This can result in serious bone fractures. Your risk for osteoporosis can be identified using a bone density scan.  If you are 70 years of age or older, or if you are at risk for osteoporosis and fractures, ask your health care provider if you should be screened.  Ask your health care provider whether you should take a calcium or vitamin D supplement to lower your risk for osteoporosis.  Menopause may have certain physical symptoms and risks.  Hormone replacement therapy may reduce some of these symptoms and risks. Talk to your health care provider about whether hormone replacement therapy is right for  you. Follow these instructions at home:  Schedule regular health, dental, and eye exams.  Stay current with your immunizations.  Do not use any tobacco products including cigarettes, chewing tobacco, or electronic cigarettes.  If you are pregnant, do not drink alcohol.  If you are breastfeeding, limit how much and how often you drink alcohol.  Limit alcohol intake to no more than 1 drink per day for nonpregnant women. One drink equals 12 ounces of beer, 5 ounces of wine, or 1 ounces of hard liquor.  Do not use street drugs.  Do not share needles.  Ask your health care provider for help if you need support or information about quitting drugs.  Tell your health care provider if you often feel depressed.  Tell your health care provider if you have ever been abused or do not feel safe at home. This information is not intended to replace advice given to you by your health care provider. Make sure you discuss any questions you have with your health care provider. Document Released: 02/10/2011 Document Revised: 01/03/2016 Document Reviewed: 05/01/2015 Elsevier Interactive Patient Education  [employer].

## 2018-04-01 NOTE — Therapy (Signed)
[facility] [address], [place], [place], [address] Phone: [phone]   Fax:  [phone]  Physical Therapy Treatment  Patient Details  Name: [name] MRN: [id number] Date of Birth: 07/15/1936 Referring Provider: Jovita Gamma, MD   Encounter Date: 04/01/2018  PT End of Session - 04/01/18 1211    Visit Number  26    Date for PT Re-Evaluation  05/11/18    Authorization Type  [employer] Medicare     Authorization Time Period  04/03/18 to 05/11/18    Authorization - Visit Number  0    Authorization - Number of Visits  10    PT Start Time  6314    PT Stop Time  1229    PT Time Calculation (min)  44 min    Activity Tolerance  Patient tolerated treatment well;No increased pain    Behavior During Therapy  WFL for tasks assessed/performed       Past Medical History:  Diagnosis Date  . Anxiety   . Colon polyps   . Complication of anesthesia    takes longer to wake up- admitted overnight after colonoscopy  . Depression   . GERD (gastroesophageal reflux disease)   . Glaucoma   . Headache    migraines  . Hx: UTI (urinary tract infection)   . Hypercholesteremia   . Hypertension   . Lichen simplex chronicus   . Lumbar stenosis with neurogenic claudication   . Macular degeneration   . Osteoarthritis   . Osteopenia    of the hip  . PONV (postoperative nausea and vomiting)   . Stress incontinence, female   . Unsteady gait   . Urinary urgency   . Wears glasses   . Whooping cough    as a baby    Past Surgical History:  Procedure Laterality Date  . ABDOMINAL HYSTERECTOMY     ovaries remain  . ANTERIOR LAT LUMBAR FUSION N/A 10/26/2017   Procedure: LUMBAR THREE- LUMBAR FOUR, LUMBAR FOUR- LUMBAR FIVE ANTEROLATERAL LUMBAR INTERBODY ARTHRODESIS;  Surgeon: Jovita Gamma, MD;  Location: [facility];  Service: Neurosurgery;  Laterality: N/A;  LUMBAR 3- LUMBAR 4, LUMBAR 4- LUMBAR 5 ANTEROLATERAL LUMBAR INTERBODY  ARTHRODESIS, LUMBAR 3- LUMBAR 4, LUMBAR 4- LUMBAR 5 PERCUTANEOUS PEDICLE SCREW FIXATION  . APPENDECTOMY    . BLADDER REPAIR     x two  . CHOLECYSTECTOMY    . COLONOSCOPY    . EYE SURGERY  11/14, 12/14   Macular Dengeneration, Glaucoma  . EYE SURGERY  2016   left eye  . LESION REMOVAL Right 06/28/2013   Procedure: EXCISION 3 cm right labial sebaceous cyst;  Surgeon: Lyman Speller, MD;  Location: [facility];  Service: Gynecology;  Laterality: Right;  . LUMBAR LAMINECTOMY/DECOMPRESSION MICRODISCECTOMY Right 03/20/2016   Procedure: Right - Lumbar four-five lumbar laminotomy, foraminotomy, and possible microdiscectomy;  Surgeon: Jovita Gamma, MD;  Location: [facility];  Service: Neurosurgery;  Laterality: Right;  right  . LUMBAR PERCUTANEOUS PEDICLE SCREW 2 LEVEL  10/26/2017   Procedure: LUMBAR THREE- LUMBAR FOUR, LUMBAR FOUR- LUMBAR FIVE PERCUTANEOUS PEDICLE SCREW FIXATION;  Surgeon: Jovita Gamma, MD;  Location: [facility];  Service: Neurosurgery;;  . MOHS SURGERY     procedure to remove basal cell  . TONSILLECTOMY AND ADENOIDECTOMY    . URETHRAL SLING  2007  . URETHRAL SLING  1/12   midurethral   . vertebroplasty secondary to traumatic compression fracture      There were no vitals filed  for this visit.  Subjective Assessment - 04/01/18 1245    Subjective  Pt reports that she does not have her walker because it's in her husband's car    Patient Stated Goals  improve Rt leg strength    Pain Onset  More than a month ago         [facility] PT Assessment - 04/01/18 0001      Assessment   Medical Diagnosis  Spondylosis without myelopathy or radiculopathy, lumbar region    Onset Date/Surgical Date  10/26/17    Next MD Visit  01/11/18    Prior Therapy  none      Precautions   Precautions  Back    Required Braces or Orthoses  Spinal Brace    Spinal Brace  Lumbar corset;Applied in sitting position      Restrictions   Weight Bearing Restrictions  No      Home Engineer, building services residence    Living Arrangements  Spouse/significant other    Type of [place] to enter    Entrance Stairs-Number of Steps  2    [place]  One level    [place] - 2 wheels;Bedside commode      Prior Function   Level of Independence  Independent    Vocation  Retired    Leisure  play sudoku/puzzles on iphone, watch TV, no regular exercise      Cognition   Overall Cognitive Status  No family/caregiver present to determine baseline cognitive functioning    Memory  Impaired    Memory Impairment  Decreased recall of new information;Decreased short term memory    Decreased Short Term Memory  Verbal basic      Observation/Other Assessments   Skin Integrity  --    Focus on Therapeutic Outcomes (FOTO)   49% limited       Posture/Postural Control   Posture/Postural Control  Postural limitations    Postural Limitations  Rounded Shoulders;Forward head;Decreased lumbar lordosis      AROM   Overall AROM Comments  --      Strength   Overall Strength Comments  tested in sitting only due to post-op status; suspect hip extension and abduction weakness due to functional limitations and gait abnormalities    Right Hip Flexion  4/5    Right Hip ABduction  3/5    Left Hip Flexion  5/5    Left Hip ABduction  4/5    Right Knee Flexion  5/5    Right Knee Extension  5/5    Left Knee Flexion  5/5    Left Knee Extension  5/5    Right Ankle Dorsiflexion  3/5    Left Ankle Dorsiflexion  4/5      Flexibility   Soft Tissue Assessment /Muscle Length  yes    Hamstrings  tightness bil    Piriformis  tightness bil      Bed Mobility   Bed Mobility  Sit to Sidelying Left      Transfers   Five time sit to stand comments   9 sec, no UE support       Ambulation/Gait   Gait Comments  Pt ambulating with RW      Standardized Balance Assessment   Standardized Balance Assessment  Timed Up and Go Test       [facility] Balance Test  Sit to Stand  Able to stand without using hands and stabilize independently    Standing Unsupported  Able to stand safely 2 minutes    Sitting with Back Unsupported but Feet Supported on Floor or Stool  Able to sit safely and securely 2 minutes    Stand to Sit  Sits safely with minimal use of hands    Transfers  Able to transfer safely, minor use of hands    Standing Unsupported with Eyes Closed  Able to stand 10 seconds safely    Standing Ubsupported with Feet Together  Able to place feet together independently and stand for 1 minute with supervision    From Standing, Reach Forward with Outstretched Arm  Can reach forward >5 cm safely (2")    From Standing Position, Pick up Object from [place] to pick up shoe safely and easily    From Standing Position, Turn to Look Behind Over each Shoulder  Turn sideways only but maintains balance    Turn 360 Degrees  Able to turn 360 degrees safely but slowly    Standing Unsupported, Alternately Place Feet on Step/Stool  Able to complete >2 steps/needs minimal assist    Standing Unsupported, One Foot in Front  Able to take small step independently and hold 30 seconds    Standing on One Leg  Tries to lift leg/unable to hold 3 seconds but remains standing independently   Lt 3 sec, Rt able to lift leg    Total Score  41      Timed Up and Go Test   Normal TUG (seconds)  13   RW   TUG Comments  without AD x11 sec (therapist Mina to prevent fall during change in direction)                    [facility] Adult PT Treatment/Exercise - 04/01/18 0001      Manual Therapy   Myofascial Release  trigger point release Rt glute max and priformis             PT Education - 04/01/18 1332    Education provided  Yes    Education Details  updated goals/progress; importance of completing HEP with resistance     Person(s) Educated  Patient    Methods  Explanation;Verbal cues    Comprehension  Verbalized understanding        PT Short Term Goals - 04/01/18 1212      PT SHORT TERM GOAL #1   Title  verbalize understanding of posture/body mechanics to decrease risk of reinjury    Status  Achieved      PT SHORT TERM GOAL #2   Title  improve functional strength by demonstrating ability to stand without UE support at least 3/5 trials    Status  Achieved      PT SHORT TERM GOAL #3   Title  improve timed up and go to < 20 sec for improved functional mobility    Baseline  9 sec     Period  Weeks    Status  Achieved        PT Long Term Goals - 04/01/18 1212      PT LONG TERM GOAL #1   Title  pt will be independent with an advanced HEP to allow for maintenance of her strength, ROM and endurance after discharge from PT.     Status  On-going      PT LONG TERM GOAL #2   Title  demonstrate improved functional strength by performing 5x STS in < 20 sec withou UE support    Baseline  9 sec    Status  Achieved      PT LONG TERM GOAL #3   Title  Pt will have score atleast 52 or higher on the BERG balance test, to place her at a low risk of falling and causing injury to herself throughout the day.     Baseline  41/56    Time  4    Period  Weeks    Status  Revised      PT LONG TERM GOAL #4   Title  amb > 250' with no more than SPC on various indoor/outdoor surfaces modified independently for improved function    Baseline  RW indoors and outdoors safely    Status  Partially Met      PT LONG TERM GOAL #5   Title  report pain < 4/10 with activity for improved functional mobility    Baseline  6/10 by the evening     Status  Partially Met      PT LONG TERM GOAL #6   Title  Pt will demo improved Rt hip strength to atleast 4/5 MMT which will improve her safety with ambulation and other activity throughout the day.    Time  4    Period  Weeks    Status  Partially Met            Plan - 04/01/18 1338    Clinical Impression Statement  Pt has made progress towards all of her short and long term goals  since her last assessment. She reports atleast 75% improvement in activity participation and steadiness. Her Berg balance score improved from 32/56 up to 41/56 and her LE strength of the hip abductor increased to 3/5 MMT. She has consistently been completing her HEP without any reported difficulty. Pt has recently been ambulating with her RW, which greatly improves her safety with ambulation, however she would like to be able to return to using her [facility] for increased independence in the community. She also demonstrates unsteadiness and trunk weakness during functional activity such as washing the dishes or changing her laundry. Due to pt's continued progress, she would benefit from skilled PT to further improve trunk stability, LE strength/endurance and proprioception in order to decrease her risk of falling and to transition her to an advanced HEP for maintenance after discharge from PT.     Rehab Potential  Good    PT Frequency  2x / week    PT Duration  4 weeks    PT Treatment/Interventions  ADLs/Self Care Home Management;Cryotherapy;Electrical Stimulation;Moist Heat;Traction;Therapeutic exercise;Therapeutic activities;Functional mobility training;Stair training;Gait training;DME Instruction;Ultrasound;Balance training;Neuromuscular re-education;Patient/family education;Manual techniques;Taping;Dry needling    PT Next Visit Plan  tennis ball massage; discuss HEP; ankle strengthening; hip abduction; review machine/exercise equipment for transition to gym after d/c     PT Home Exercise Plan  Access Code: [email or web address]    Consulted and Agree with Plan of Care  Patient       Patient will benefit from skilled therapeutic intervention in order to improve the following deficits and impairments:  Abnormal gait, Decreased balance, Decreased cognition, Decreased knowledge of use of DME, Decreased mobility, Decreased safety awareness, Impaired flexibility, Postural dysfunction, Improper body mechanics, Impaired  perceived functional ability, Difficulty walking, Increased muscle spasms, Pain, Increased fascial restricitons, Decreased strength, Decreased endurance  Visit Diagnosis: Unsteadiness on feet  History of falling  Muscle weakness (generalized)  Chronic right-sided low back pain without sciatica     Problem List Patient Active Problem List   Diagnosis Date Noted  . Chest pain 01/24/2018  . Liver lesion 11/10/2017  . Nausea & vomiting 10/31/2017  . Chronic daily headache 07/08/2017  . Memory changes 03/06/2017  . Lumbar stenosis with neurogenic claudication 03/20/2016  . Scalp pain 10/22/2015  . Post-traumatic headache 10/11/2015  . Routine health maintenance 03/06/2013  . Macular degeneration of right eye 03/03/2013  . Glaucoma 03/03/2013  . Hyperlipidemia 03/14/2009  . Depression 10/19/2007  . Essential hypertension 10/19/2007  . GASTROESOPHAGEAL REFLUX DISEASE 10/19/2007  . Osteoarthritis 10/19/2007  . STRESS INCONTINENCE 10/19/2007  . Allergic rhinitis 10/19/2007    4:19 PM,04/01/18 Sherol Dade PT, DPT [facility] [address] [place], [place], [address] Phone: [phone]   Fax:  [phone]  Name: JOSEPHA BARBIER MRN: [id number] Date of Birth: Dec 30, 1935

## 2018-04-07 ENCOUNTER — Ambulatory Visit: Payer: Medicare Other

## 2018-04-07 DIAGNOSIS — R2681 Unsteadiness on feet: Secondary | ICD-10-CM

## 2018-04-07 DIAGNOSIS — Z9181 History of falling: Secondary | ICD-10-CM

## 2018-04-07 DIAGNOSIS — G8929 Other chronic pain: Secondary | ICD-10-CM

## 2018-04-07 DIAGNOSIS — M545 Low back pain: Secondary | ICD-10-CM

## 2018-04-07 DIAGNOSIS — M6281 Muscle weakness (generalized): Secondary | ICD-10-CM

## 2018-04-07 NOTE — Therapy (Signed)
[facility] [address], [place], [place], [address] Phone: [phone]   Fax:  [phone]  Physical Therapy Treatment  Patient Details  Name: [name] MRN: [id number] Date of Birth: 1936-06-04 Referring Provider: Jovita Gamma, MD   Encounter Date: 04/07/2018  PT End of Session - 04/07/18 1445    Visit Number  27    Date for PT Re-Evaluation  05/11/18    Authorization Type  [employer] Medicare     Authorization - Visit Number  1    Authorization - Number of Visits  10    PT Start Time  3007    PT Stop Time  1444    PT Time Calculation (min)  42 min    Activity Tolerance  Patient tolerated treatment well;No increased pain    Behavior During Therapy  WFL for tasks assessed/performed       Past Medical History:  Diagnosis Date  . Anxiety   . Colon polyps   . Complication of anesthesia    takes longer to wake up- admitted overnight after colonoscopy  . Depression   . GERD (gastroesophageal reflux disease)   . Glaucoma   . Headache    migraines  . Hx: UTI (urinary tract infection)   . Hypercholesteremia   . Hypertension   . Lichen simplex chronicus   . Lumbar stenosis with neurogenic claudication   . Macular degeneration   . Osteoarthritis   . Osteopenia    of the hip  . PONV (postoperative nausea and vomiting)   . Stress incontinence, female   . Unsteady gait   . Urinary urgency   . Wears glasses   . Whooping cough    as a baby    Past Surgical History:  Procedure Laterality Date  . ABDOMINAL HYSTERECTOMY     ovaries remain  . ANTERIOR LAT LUMBAR FUSION N/A 10/26/2017   Procedure: LUMBAR THREE- LUMBAR FOUR, LUMBAR FOUR- LUMBAR FIVE ANTEROLATERAL LUMBAR INTERBODY ARTHRODESIS;  Surgeon: Jovita Gamma, MD;  Location: [facility];  Service: Neurosurgery;  Laterality: N/A;  LUMBAR 3- LUMBAR 4, LUMBAR 4- LUMBAR 5 ANTEROLATERAL LUMBAR INTERBODY ARTHRODESIS, LUMBAR 3- LUMBAR 4, LUMBAR 4- LUMBAR 5  PERCUTANEOUS PEDICLE SCREW FIXATION  . APPENDECTOMY    . BLADDER REPAIR     x two  . CHOLECYSTECTOMY    . COLONOSCOPY    . EYE SURGERY  11/14, 12/14   Macular Dengeneration, Glaucoma  . EYE SURGERY  2016   left eye  . LESION REMOVAL Right 06/28/2013   Procedure: EXCISION 3 cm right labial sebaceous cyst;  Surgeon: Lyman Speller, MD;  Location: [facility];  Service: Gynecology;  Laterality: Right;  . LUMBAR LAMINECTOMY/DECOMPRESSION MICRODISCECTOMY Right 03/20/2016   Procedure: Right - Lumbar four-five lumbar laminotomy, foraminotomy, and possible microdiscectomy;  Surgeon: Jovita Gamma, MD;  Location: [facility];  Service: Neurosurgery;  Laterality: Right;  right  . LUMBAR PERCUTANEOUS PEDICLE SCREW 2 LEVEL  10/26/2017   Procedure: LUMBAR THREE- LUMBAR FOUR, LUMBAR FOUR- LUMBAR FIVE PERCUTANEOUS PEDICLE SCREW FIXATION;  Surgeon: Jovita Gamma, MD;  Location: [facility];  Service: Neurosurgery;;  . MOHS SURGERY     procedure to remove basal cell  . TONSILLECTOMY AND ADENOIDECTOMY    . URETHRAL SLING  2007  . URETHRAL SLING  1/12   midurethral   . vertebroplasty secondary to traumatic compression fracture      There were no vitals filed for this visit.  Subjective Assessment - 04/07/18 1416  Subjective  I have been busy today.     Patient Stated Goals  improve Rt leg strength    Currently in Pain?  Yes    Pain Location  Back    Pain Orientation  Right;Lower;Left    Pain Descriptors / Indicators  Aching    Pain Onset  More than a month ago    Pain Frequency  Constant    Aggravating Factors   activity, standing a lot    Pain Relieving Factors  rest, massage                       [facility] Adult PT Treatment/Exercise - 04/07/18 0001      High Level Balance   High Level Balance Comments  tandem stance (modified) with min UE support 2 times each leg, single leg stance Rt and Lt 2x10 seconds each      Lumbar Exercises: Aerobic   Nustep  Level 2x 8 minutes   PT  present to discuss progress     Knee/Hip Exercises: Standing   Heel Raises  2 sets;15 reps    Hip Abduction  Stengthening;2 sets;10 reps;Both;Other (comment)    Abduction Limitations  2# added    Forward Step Up Limitations  alternating toe tap on 4" with min UE support 2x10 bil each    Other Standing Knee Exercises  standing on black balance pad: static standing with minimal UE support 3x 30 seconds      Knee/Hip Exercises: Seated   Other Seated Knee/Hip Exercises  seated ankle circles x10 reps each               PT Short Term Goals - 04/01/18 1212      PT SHORT TERM GOAL #1   Title  verbalize understanding of posture/body mechanics to decrease risk of reinjury    Status  Achieved      PT SHORT TERM GOAL #2   Title  improve functional strength by demonstrating ability to stand without UE support at least 3/5 trials    Status  Achieved      PT SHORT TERM GOAL #3   Title  improve timed up and go to < 20 sec for improved functional mobility    Baseline  9 sec     Period  Weeks    Status  Achieved        PT Long Term Goals - 04/01/18 1212      PT LONG TERM GOAL #1   Title  pt will be independent with an advanced HEP to allow for maintenance of her strength, ROM and endurance after discharge from PT.     Status  On-going      PT LONG TERM GOAL #2   Title  demonstrate improved functional strength by performing 5x STS in < 20 sec withou UE support    Baseline  9 sec    Status  Achieved      PT LONG TERM GOAL #3   Title  Pt will have score atleast 52 or higher on the BERG balance test, to place her at a low risk of falling and causing injury to herself throughout the day.     Baseline  41/56    Time  4    Period  Weeks    Status  Revised      PT LONG TERM GOAL #4   Title  amb > 250' with no more than SPC on various indoor/outdoor surfaces modified  independently for improved function    Baseline  RW indoors and outdoors safely    Status  Partially Met      PT  LONG TERM GOAL #5   Title  report pain < 4/10 with activity for improved functional mobility    Baseline  6/10 by the evening     Status  Partially Met      PT LONG TERM GOAL #6   Title  Pt will demo improved Rt hip strength to atleast 4/5 MMT which will improve her safety with ambulation and other activity throughout the day.    Time  4    Period  Weeks    Status  Partially Met            Plan - 04/07/18 1408    Clinical Impression Statement  Pt has made progress towards all of her short and long term goals this week.  She reports at least 75% improvement in activity participation and steadiness.  She has consistently been completing her HEP without any reported difficulty. Pt has recently been ambulating with her RW, which greatly improves her safety with ambulation, however she would like to be able to return to using her [facility] for increased independence in the community. She also demonstrates unsteadiness and trunk weakness during functional activities.  Pt requires close GGA with all exercises in the clinic and frequent redirection during activity by PT.  Pt is making slow progress due to chronic nature of condition and will continue to benefit from skilled PT for strength, endurance and balance progression to improve safety.        Rehab Potential  Good    PT Frequency  2x / week    PT Duration  4 weeks    PT Treatment/Interventions  ADLs/Self Care Home Management;Cryotherapy;Electrical Stimulation;Moist Heat;Traction;Therapeutic exercise;Therapeutic activities;Functional mobility training;Stair training;Gait training;DME Instruction;Ultrasound;Balance training;Neuromuscular re-education;Patient/family education;Manual techniques;Taping;Dry needling    PT Next Visit Plan  Strength, balance, endurance progression    PT Home Exercise Plan  Access Code: [email or web address]    Recommended Other Services  recert sent 0/76/22    Consulted and Agree with Plan of Care  Patient       Patient will  benefit from skilled therapeutic intervention in order to improve the following deficits and impairments:  Abnormal gait, Decreased balance, Decreased cognition, Decreased knowledge of use of DME, Decreased mobility, Decreased safety awareness, Impaired flexibility, Postural dysfunction, Improper body mechanics, Impaired perceived functional ability, Difficulty walking, Increased muscle spasms, Pain, Increased fascial restricitons, Decreased strength, Decreased endurance  Visit Diagnosis: Unsteadiness on feet  History of falling  Chronic right-sided low back pain without sciatica  Muscle weakness (generalized)     Problem List Patient Active Problem List   Diagnosis Date Noted  . Chest pain 01/24/2018  . Liver lesion 11/10/2017  . Nausea & vomiting 10/31/2017  . Chronic daily headache 07/08/2017  . Memory changes 03/06/2017  . Lumbar stenosis with neurogenic claudication 03/20/2016  . Scalp pain 10/22/2015  . Post-traumatic headache 10/11/2015  . Routine health maintenance 03/06/2013  . Macular degeneration of right eye 03/03/2013  . Glaucoma 03/03/2013  . Hyperlipidemia 03/14/2009  . Depression 10/19/2007  . Essential hypertension 10/19/2007  . GASTROESOPHAGEAL REFLUX DISEASE 10/19/2007  . Osteoarthritis 10/19/2007  . STRESS INCONTINENCE 10/19/2007  . Allergic rhinitis 10/19/2007    Sigurd Sos, PT 04/07/18 2:49 PM  [facility] [address] [place], [place], [address] Phone: [phone]   Fax:  [phone]  Name: MARQUIA COSTELLO MRN: [id number] Date of Birth: May 28, 1936

## 2018-04-09 ENCOUNTER — Ambulatory Visit: Payer: Medicare Other | Admitting: Physical Therapy

## 2018-04-09 DIAGNOSIS — M545 Low back pain, unspecified: Secondary | ICD-10-CM

## 2018-04-09 DIAGNOSIS — G8929 Other chronic pain: Secondary | ICD-10-CM

## 2018-04-09 DIAGNOSIS — R2681 Unsteadiness on feet: Secondary | ICD-10-CM | POA: Diagnosis not present

## 2018-04-09 DIAGNOSIS — M6281 Muscle weakness (generalized): Secondary | ICD-10-CM

## 2018-04-09 DIAGNOSIS — Z9181 History of falling: Secondary | ICD-10-CM

## 2018-04-09 NOTE — Therapy (Signed)
[facility] [address], [place], [place], [address] Phone: [phone]   Fax:  [phone]  Physical Therapy Treatment  Patient Details  Name: [name] MRN: [id number] Date of Birth: August 07, 1936 Referring Provider: Jovita Gamma, MD   Encounter Date: 04/09/2018  PT End of Session - 04/09/18 1009    Visit Number  28    Date for PT Re-Evaluation  05/11/18    Authorization Type  [employer] Medicare     Authorization - Visit Number  2    Authorization - Number of Visits  10    PT Start Time  0930    PT Stop Time  1010    PT Time Calculation (min)  40 min    Activity Tolerance  Patient tolerated treatment well;No increased pain    Behavior During Therapy  WFL for tasks assessed/performed       Past Medical History:  Diagnosis Date  . Anxiety   . Colon polyps   . Complication of anesthesia    takes longer to wake up- admitted overnight after colonoscopy  . Depression   . GERD (gastroesophageal reflux disease)   . Glaucoma   . Headache    migraines  . Hx: UTI (urinary tract infection)   . Hypercholesteremia   . Hypertension   . Lichen simplex chronicus   . Lumbar stenosis with neurogenic claudication   . Macular degeneration   . Osteoarthritis   . Osteopenia    of the hip  . PONV (postoperative nausea and vomiting)   . Stress incontinence, female   . Unsteady gait   . Urinary urgency   . Wears glasses   . Whooping cough    as a baby    Past Surgical History:  Procedure Laterality Date  . ABDOMINAL HYSTERECTOMY     ovaries remain  . ANTERIOR LAT LUMBAR FUSION N/A 10/26/2017   Procedure: LUMBAR THREE- LUMBAR FOUR, LUMBAR FOUR- LUMBAR FIVE ANTEROLATERAL LUMBAR INTERBODY ARTHRODESIS;  Surgeon: Jovita Gamma, MD;  Location: [facility];  Service: Neurosurgery;  Laterality: N/A;  LUMBAR 3- LUMBAR 4, LUMBAR 4- LUMBAR 5 ANTEROLATERAL LUMBAR INTERBODY ARTHRODESIS, LUMBAR 3- LUMBAR 4, LUMBAR 4- LUMBAR 5  PERCUTANEOUS PEDICLE SCREW FIXATION  . APPENDECTOMY    . BLADDER REPAIR     x two  . CHOLECYSTECTOMY    . COLONOSCOPY    . EYE SURGERY  11/14, 12/14   Macular Dengeneration, Glaucoma  . EYE SURGERY  2016   left eye  . LESION REMOVAL Right 06/28/2013   Procedure: EXCISION 3 cm right labial sebaceous cyst;  Surgeon: Lyman Speller, MD;  Location: [facility];  Service: Gynecology;  Laterality: Right;  . LUMBAR LAMINECTOMY/DECOMPRESSION MICRODISCECTOMY Right 03/20/2016   Procedure: Right - Lumbar four-five lumbar laminotomy, foraminotomy, and possible microdiscectomy;  Surgeon: Jovita Gamma, MD;  Location: [facility];  Service: Neurosurgery;  Laterality: Right;  right  . LUMBAR PERCUTANEOUS PEDICLE SCREW 2 LEVEL  10/26/2017   Procedure: LUMBAR THREE- LUMBAR FOUR, LUMBAR FOUR- LUMBAR FIVE PERCUTANEOUS PEDICLE SCREW FIXATION;  Surgeon: Jovita Gamma, MD;  Location: [facility];  Service: Neurosurgery;;  . MOHS SURGERY     procedure to remove basal cell  . TONSILLECTOMY AND ADENOIDECTOMY    . URETHRAL SLING  2007  . URETHRAL SLING  1/12   midurethral   . vertebroplasty secondary to traumatic compression fracture      There were no vitals filed for this visit.  Subjective Assessment - 04/09/18 0076  Subjective  Pt has some discomfort in her low back currently.     Patient Stated Goals  improve Rt leg strength    Currently in Pain?  Yes    Pain Score  5     Pain Location  Back    Pain Orientation  Right;Lower    Pain Descriptors / Indicators  Aching;Sore    Pain Type  Chronic pain    Pain Radiating Towards  none     Pain Onset  More than a month ago    Pain Frequency  Constant    Aggravating Factors   alot of activity     Pain Relieving Factors  rest and massage                        [facility] Adult PT Treatment/Exercise - 04/09/18 0001      Exercises   Exercises  Knee/Hip      Knee/Hip Exercises: Supine   Straight Leg Raises  Right;2 sets;10 reps;Limitations     Straight Leg Raises Limitations  1# ankle weight      Knee/Hip Exercises: Sidelying   Hip ABduction  Right;2 sets;10 reps    Hip ABduction Limitations  1# ankle weight, RUE press into table for abdominal support       Knee/Hip Exercises: Prone   Straight Leg Raises  Right;2 sets;10 reps      Manual Therapy   Manual Therapy  Joint mobilization    Joint Mobilization  Rt posterior ilium MWM 2x10 reps     Soft tissue mobilization  Rt proximal glutes, Rt lumbar paraspinals    Myofascial Release  Rt proximal glute trigger point release           Balance Exercises - 04/09/18 0958      Balance Exercises: Seated   Dynamic Sitting  Eyes opened;Reaching outside base of support   x10 reps on firm surface, x10 reps uneven surface       PT Education - 04/09/18 1005    Education provided  Yes    Education Details  use of small towel for lumbar support in car    Person(s) Educated  Patient    Methods  Explanation;Verbal cues    Comprehension  Verbalized understanding;Returned demonstration       PT Short Term Goals - 04/01/18 1212      PT SHORT TERM GOAL #1   Title  verbalize understanding of posture/body mechanics to decrease risk of reinjury    Status  Achieved      PT SHORT TERM GOAL #2   Title  improve functional strength by demonstrating ability to stand without UE support at least 3/5 trials    Status  Achieved      PT SHORT TERM GOAL #3   Title  improve timed up and go to < 20 sec for improved functional mobility    Baseline  9 sec     Period  Weeks    Status  Achieved        PT Long Term Goals - 04/01/18 1212      PT LONG TERM GOAL #1   Title  pt will be independent with an advanced HEP to allow for maintenance of her strength, ROM and endurance after discharge from PT.     Status  On-going      PT LONG TERM GOAL #2   Title  demonstrate improved functional strength by performing 5x STS in < 20 sec withou  UE support    Baseline  9 sec    Status  Achieved       PT LONG TERM GOAL #3   Title  Pt will have score atleast 52 or higher on the BERG balance test, to place her at a low risk of falling and causing injury to herself throughout the day.     Baseline  41/56    Time  4    Period  Weeks    Status  Revised      PT LONG TERM GOAL #4   Title  amb > 250' with no more than SPC on various indoor/outdoor surfaces modified independently for improved function    Baseline  RW indoors and outdoors safely    Status  Partially Met      PT LONG TERM GOAL #5   Title  report pain < 4/10 with activity for improved functional mobility    Baseline  6/10 by the evening     Status  Partially Met      PT LONG TERM GOAL #6   Title  Pt will demo improved Rt hip strength to atleast 4/5 MMT which will improve her safety with ambulation and other activity throughout the day.    Time  4    Period  Weeks    Status  Partially Met            Plan - 04/09/18 1010    Clinical Impression Statement  Pt arrived with Rt low back discomfort. Therapist completed manual techniques to the area with resulting pain free Rt hip extension in prone. Pt was educated on lumbar support technique for long car rides and reported good understanding of this. Pt was able to completed LE strengthening exercise with addition of abdominal brace into the mat and reported no increase in pain end of session.     Rehab Potential  Good    PT Frequency  2x / week    PT Duration  4 weeks    PT Treatment/Interventions  ADLs/Self Care Home Management;Cryotherapy;Electrical Stimulation;Moist Heat;Traction;Therapeutic exercise;Therapeutic activities;Functional mobility training;Stair training;Gait training;DME Instruction;Ultrasound;Balance training;Neuromuscular re-education;Patient/family education;Manual techniques;Taping;Dry needling    PT Next Visit Plan  STM as needed; f/u on towel roll during long car ride; balance activity and LE strength progression    PT Home Exercise Plan  Access  Code: [email or web address]    Consulted and Agree with Plan of Care  Patient       Patient will benefit from skilled therapeutic intervention in order to improve the following deficits and impairments:  Abnormal gait, Decreased balance, Decreased cognition, Decreased knowledge of use of DME, Decreased mobility, Decreased safety awareness, Impaired flexibility, Postural dysfunction, Improper body mechanics, Impaired perceived functional ability, Difficulty walking, Increased muscle spasms, Pain, Increased fascial restricitons, Decreased strength, Decreased endurance  Visit Diagnosis: Unsteadiness on feet  History of falling  Chronic right-sided low back pain without sciatica  Muscle weakness (generalized)     Problem List Patient Active Problem List   Diagnosis Date Noted  . Chest pain 01/24/2018  . Liver lesion 11/10/2017  . Nausea & vomiting 10/31/2017  . Chronic daily headache 07/08/2017  . Memory changes 03/06/2017  . Lumbar stenosis with neurogenic claudication 03/20/2016  . Scalp pain 10/22/2015  . Post-traumatic headache 10/11/2015  . Routine health maintenance 03/06/2013  . Macular degeneration of right eye 03/03/2013  . Glaucoma 03/03/2013  . Hyperlipidemia 03/14/2009  . Depression 10/19/2007  . Essential hypertension 10/19/2007  . GASTROESOPHAGEAL REFLUX DISEASE 10/19/2007  .  Osteoarthritis 10/19/2007  . STRESS INCONTINENCE 10/19/2007  . Allergic rhinitis 10/19/2007   10:16 AM,04/09/18 Sherol Dade PT, DPT [facility] [address], [place], [place], [address] Phone: [phone]   Fax:  [phone]  Name: [name] MRN: [id number] Date of Birth: 10/21/1935

## 2018-04-17 ENCOUNTER — Other Ambulatory Visit: Payer: Self-pay | Admitting: Obstetrics & Gynecology

## 2018-04-19 ENCOUNTER — Ambulatory Visit: Payer: Medicare Other | Attending: Neurosurgery

## 2018-04-19 DIAGNOSIS — M6281 Muscle weakness (generalized): Secondary | ICD-10-CM | POA: Diagnosis present

## 2018-04-19 DIAGNOSIS — G8929 Other chronic pain: Secondary | ICD-10-CM | POA: Diagnosis present

## 2018-04-19 DIAGNOSIS — M545 Low back pain: Secondary | ICD-10-CM | POA: Diagnosis present

## 2018-04-19 DIAGNOSIS — R2681 Unsteadiness on feet: Secondary | ICD-10-CM | POA: Insufficient documentation

## 2018-04-19 DIAGNOSIS — Z9181 History of falling: Secondary | ICD-10-CM | POA: Diagnosis present

## 2018-04-19 NOTE — Telephone Encounter (Signed)
Medication refill request: clobetasol ointment 0.05%  Last AEX:  03/26/17  Next AEX: 05/14/18 Last MMG (if hormonal medication request): 12/22/17 Bi-rads category 2 benign  Refill authorized: Please refill until AEX

## 2018-04-19 NOTE — Therapy (Signed)
[facility] [address] [place], [place], [address] Phone: [phone]   Fax:  [phone]  Physical Therapy Treatment  Patient Details  Name: [name] MRN: [id number] Date of Birth: 13-May-1936 Referring Provider: Jovita Gamma, MD   Encounter Date: 04/19/2018  PT End of Session - 04/19/18 1313    Visit Number  29    Date for PT Re-Evaluation  05/11/18    Authorization Type  [employer] Medicare     PT Start Time  1230    PT Stop Time  1310    PT Time Calculation (min)  40 min    Activity Tolerance  Patient tolerated treatment well;No increased pain    Behavior During Therapy  WFL for tasks assessed/performed       Past Medical History:  Diagnosis Date  . Anxiety   . Colon polyps   . Complication of anesthesia    takes longer to wake up- admitted overnight after colonoscopy  . Depression   . GERD (gastroesophageal reflux disease)   . Glaucoma   . Headache    migraines  . Hx: UTI (urinary tract infection)   . Hypercholesteremia   . Hypertension   . Lichen simplex chronicus   . Lumbar stenosis with neurogenic claudication   . Macular degeneration   . Osteoarthritis   . Osteopenia    of the hip  . PONV (postoperative nausea and vomiting)   . Stress incontinence, female   . Unsteady gait   . Urinary urgency   . Wears glasses   . Whooping cough    as a baby    Past Surgical History:  Procedure Laterality Date  . ABDOMINAL HYSTERECTOMY     ovaries remain  . ANTERIOR LAT LUMBAR FUSION N/A 10/26/2017   Procedure: LUMBAR THREE- LUMBAR FOUR, LUMBAR FOUR- LUMBAR FIVE ANTEROLATERAL LUMBAR INTERBODY ARTHRODESIS;  Surgeon: Jovita Gamma, MD;  Location: [facility];  Service: Neurosurgery;  Laterality: N/A;  LUMBAR 3- LUMBAR 4, LUMBAR 4- LUMBAR 5 ANTEROLATERAL LUMBAR INTERBODY ARTHRODESIS, LUMBAR 3- LUMBAR 4, LUMBAR 4- LUMBAR 5 PERCUTANEOUS PEDICLE SCREW FIXATION  . APPENDECTOMY    . BLADDER REPAIR     x two   . CHOLECYSTECTOMY    . COLONOSCOPY    . EYE SURGERY  11/14, 12/14   Macular Dengeneration, Glaucoma  . EYE SURGERY  2016   left eye  . LESION REMOVAL Right 06/28/2013   Procedure: EXCISION 3 cm right labial sebaceous cyst;  Surgeon: Lyman Speller, MD;  Location: [facility];  Service: Gynecology;  Laterality: Right;  . LUMBAR LAMINECTOMY/DECOMPRESSION MICRODISCECTOMY Right 03/20/2016   Procedure: Right - Lumbar four-five lumbar laminotomy, foraminotomy, and possible microdiscectomy;  Surgeon: Jovita Gamma, MD;  Location: [facility];  Service: Neurosurgery;  Laterality: Right;  right  . LUMBAR PERCUTANEOUS PEDICLE SCREW 2 LEVEL  10/26/2017   Procedure: LUMBAR THREE- LUMBAR FOUR, LUMBAR FOUR- LUMBAR FIVE PERCUTANEOUS PEDICLE SCREW FIXATION;  Surgeon: Jovita Gamma, MD;  Location: [facility];  Service: Neurosurgery;;  . MOHS SURGERY     procedure to remove basal cell  . TONSILLECTOMY AND ADENOIDECTOMY    . URETHRAL SLING  2007  . URETHRAL SLING  1/12   midurethral   . vertebroplasty secondary to traumatic compression fracture      There were no vitals filed for this visit.  Subjective Assessment - 04/19/18 1241    Subjective  I wasn't here last week.  I am just exhausted- we had homecomming at church  yesterday.      Patient Stated Goals  improve Rt leg strength    Currently in Pain?  No/denies    Pain Orientation  Right   my Rt leg has been numb                      [facility] Adult PT Treatment/Exercise - 04/19/18 0001      Lumbar Exercises: Aerobic   Nustep  Level 2x 8 minutes   PT present to discuss progress     Knee/Hip Exercises: Standing   Heel Raises  2 sets;15 reps    Hip Abduction  Stengthening;2 sets;10 reps;Both;Other (comment)    Abduction Limitations  2# added    Hip Extension  Both;10 reps;Knee straight;2 sets    Extension Limitations  2#    Forward Step Up Limitations  alternating toe tap on 4" with min UE support 2x10 bil each    Other Standing  Knee Exercises  standing on black balance pad: static standing with minimal UE support 3x 30 seconds               PT Short Term Goals - 04/01/18 1212      PT SHORT TERM GOAL #1   Title  verbalize understanding of posture/body mechanics to decrease risk of reinjury    Status  Achieved      PT SHORT TERM GOAL #2   Title  improve functional strength by demonstrating ability to stand without UE support at least 3/5 trials    Status  Achieved      PT SHORT TERM GOAL #3   Title  improve timed up and go to < 20 sec for improved functional mobility    Baseline  9 sec     Period  Weeks    Status  Achieved        PT Long Term Goals - 04/01/18 1212      PT LONG TERM GOAL #1   Title  pt will be independent with an advanced HEP to allow for maintenance of her strength, ROM and endurance after discharge from PT.     Status  On-going      PT LONG TERM GOAL #2   Title  demonstrate improved functional strength by performing 5x STS in < 20 sec withou UE support    Baseline  9 sec    Status  Achieved      PT LONG TERM GOAL #3   Title  Pt will have score atleast 52 or higher on the BERG balance test, to place her at a low risk of falling and causing injury to herself throughout the day.     Baseline  41/56    Time  4    Period  Weeks    Status  Revised      PT LONG TERM GOAL #4   Title  amb > 250' with no more than SPC on various indoor/outdoor surfaces modified independently for improved function    Baseline  RW indoors and outdoors safely    Status  Partially Met      PT LONG TERM GOAL #5   Title  report pain < 4/10 with activity for improved functional mobility    Baseline  6/10 by the evening     Status  Partially Met      PT LONG TERM GOAL #6   Title  Pt will demo improved Rt hip strength to atleast 4/5 MMT which will improve her  safety with ambulation and other activity throughout the day.    Time  4    Period  Weeks    Status  Partially Met            Plan  - 04/19/18 1257    Clinical Impression Statement   She reports at least 75% improvement in activity participation and steadiness.  She has consistently been completing her HEP without any reported difficulty. Pt has recently been ambulating with her RW, which greatly improves her safety with ambulation, however she would like to be able to return to using her [facility] for increased independence in the community. She also demonstrates unsteadiness and trunk weakness during functional activities.  Pt requires close GGA with all exercises in the clinic and frequent redirection during activity by PT.  Pt is making slow progress due to chronic nature of condition and will continue to benefit from skilled PT for strength, endurance and balance progression to improve safety.         Rehab Potential  Good    PT Frequency  2x / week    PT Duration  4 weeks    PT Treatment/Interventions  ADLs/Self Care Home Management;Cryotherapy;Electrical Stimulation;Moist Heat;Traction;Therapeutic exercise;Therapeutic activities;Functional mobility training;Stair training;Gait training;DME Instruction;Ultrasound;Balance training;Neuromuscular re-education;Patient/family education;Manual techniques;Taping;Dry needling    PT Next Visit Plan  Continue to work on strength, balance and endurance    PT Home Exercise Plan  Access Code: [email or web address]    GYBWLSLHT and Agree with Plan of Care  Patient       Patient will benefit from skilled therapeutic intervention in order to improve the following deficits and impairments:  Abnormal gait, Decreased balance, Decreased cognition, Decreased knowledge of use of DME, Decreased mobility, Decreased safety awareness, Impaired flexibility, Postural dysfunction, Improper body mechanics, Impaired perceived functional ability, Difficulty walking, Increased muscle spasms, Pain, Increased fascial restricitons, Decreased strength, Decreased endurance  Visit Diagnosis: Unsteadiness on feet  History of  falling  Chronic right-sided low back pain without sciatica  Muscle weakness (generalized)     Problem List Patient Active Problem List   Diagnosis Date Noted  . Chest pain 01/24/2018  . Liver lesion 11/10/2017  . Nausea & vomiting 10/31/2017  . Chronic daily headache 07/08/2017  . Memory changes 03/06/2017  . Lumbar stenosis with neurogenic claudication 03/20/2016  . Scalp pain 10/22/2015  . Post-traumatic headache 10/11/2015  . Routine health maintenance 03/06/2013  . Macular degeneration of right eye 03/03/2013  . Glaucoma 03/03/2013  . Hyperlipidemia 03/14/2009  . Depression 10/19/2007  . Essential hypertension 10/19/2007  . GASTROESOPHAGEAL REFLUX DISEASE 10/19/2007  . Osteoarthritis 10/19/2007  . STRESS INCONTINENCE 10/19/2007  . Allergic rhinitis 10/19/2007   Sigurd Sos, PT 04/19/18 1:14 PM   [facility] [address] [place], [place], [address] Phone: [phone]   Fax:  [phone]  Name: [name] MRN: [id number] Date of Birth: [birth year]

## 2018-04-20 ENCOUNTER — Other Ambulatory Visit: Payer: Self-pay | Admitting: Family Medicine

## 2018-04-23 ENCOUNTER — Ambulatory Visit: Payer: Medicare Other | Admitting: Physical Therapy

## 2018-04-23 DIAGNOSIS — M6281 Muscle weakness (generalized): Secondary | ICD-10-CM

## 2018-04-23 DIAGNOSIS — Z9181 History of falling: Secondary | ICD-10-CM

## 2018-04-23 DIAGNOSIS — R2681 Unsteadiness on feet: Secondary | ICD-10-CM | POA: Diagnosis not present

## 2018-04-23 DIAGNOSIS — M545 Low back pain: Secondary | ICD-10-CM

## 2018-04-23 DIAGNOSIS — G8929 Other chronic pain: Secondary | ICD-10-CM

## 2018-04-23 NOTE — Therapy (Signed)
[facility] [address] [place], [place], [address] Phone: [phone]   Fax:  [phone]  Physical Therapy Treatment  Patient Details  Name: [name] MRN: [id number] Date of Birth: [birth year] Referring Provider: Jovita Gamma, MD   Encounter Date: 04/23/2018  PT End of Session - 04/23/18 1154    Visit Number  30    Date for PT Re-Evaluation  05/11/18    Authorization Type  [employer] Medicare     Authorization - Visit Number  4    Authorization - Number of Visits  10    PT Start Time  1100    PT Stop Time  1146    PT Time Calculation (min)  46 min    Activity Tolerance  Patient tolerated treatment well;No increased pain    Behavior During Therapy  WFL for tasks assessed/performed       Past Medical History:  Diagnosis Date  . Anxiety   . Colon polyps   . Complication of anesthesia    takes longer to wake up- admitted overnight after colonoscopy  . Depression   . GERD (gastroesophageal reflux disease)   . Glaucoma   . Headache    migraines  . Hx: UTI (urinary tract infection)   . Hypercholesteremia   . Hypertension   . Lichen simplex chronicus   . Lumbar stenosis with neurogenic claudication   . Macular degeneration   . Osteoarthritis   . Osteopenia    of the hip  . PONV (postoperative nausea and vomiting)   . Stress incontinence, female   . Unsteady gait   . Urinary urgency   . Wears glasses   . Whooping cough    as a baby    Past Surgical History:  Procedure Laterality Date  . ABDOMINAL HYSTERECTOMY     ovaries remain  . ANTERIOR LAT LUMBAR FUSION N/A 10/26/2017   Procedure: LUMBAR THREE- LUMBAR FOUR, LUMBAR FOUR- LUMBAR FIVE ANTEROLATERAL LUMBAR INTERBODY ARTHRODESIS;  Surgeon: Jovita Gamma, MD;  Location: [facility];  Service: Neurosurgery;  Laterality: N/A;  LUMBAR 3- LUMBAR 4, LUMBAR 4- LUMBAR 5 ANTEROLATERAL LUMBAR INTERBODY ARTHRODESIS, LUMBAR 3- LUMBAR 4, LUMBAR 4- LUMBAR 5  PERCUTANEOUS PEDICLE SCREW FIXATION  . APPENDECTOMY    . BLADDER REPAIR     x two  . CHOLECYSTECTOMY    . COLONOSCOPY    . EYE SURGERY  11/14, 12/14   Macular Dengeneration, Glaucoma  . EYE SURGERY  2016   left eye  . LESION REMOVAL Right 06/28/2013   Procedure: EXCISION 3 cm right labial sebaceous cyst;  Surgeon: Lyman Speller, MD;  Location: [facility];  Service: Gynecology;  Laterality: Right;  . LUMBAR LAMINECTOMY/DECOMPRESSION MICRODISCECTOMY Right 03/20/2016   Procedure: Right - Lumbar four-five lumbar laminotomy, foraminotomy, and possible microdiscectomy;  Surgeon: Jovita Gamma, MD;  Location: [facility];  Service: Neurosurgery;  Laterality: Right;  right  . LUMBAR PERCUTANEOUS PEDICLE SCREW 2 LEVEL  10/26/2017   Procedure: LUMBAR THREE- LUMBAR FOUR, LUMBAR FOUR- LUMBAR FIVE PERCUTANEOUS PEDICLE SCREW FIXATION;  Surgeon: Jovita Gamma, MD;  Location: [facility];  Service: Neurosurgery;;  . MOHS SURGERY     procedure to remove basal cell  . TONSILLECTOMY AND ADENOIDECTOMY    . URETHRAL SLING  2007  . URETHRAL SLING  1/12   midurethral   . vertebroplasty secondary to traumatic compression fracture      There were no vitals filed for this visit.  Subjective Assessment - 04/23/18 1103  Subjective  Pt states that she has a pain in her Rt low back. She has been completing her HEP.     Patient Stated Goals  improve Rt leg strength    Currently in Pain?  Yes    Pain Score  6     Pain Orientation  Right;Lower    Pain Descriptors / Indicators  Sore    Pain Type  Chronic pain    Pain Radiating Towards  none     Pain Onset  More than a month ago    Pain Frequency  Constant    Aggravating Factors   alot of activity    Pain Relieving Factors  massage, pain patch                       [facility] Adult PT Treatment/Exercise - 04/23/18 0001      Self-Care   Other Self-Care Comments   self trigger point release with tennis ball       Lumbar Exercises: Aerobic    Nustep  L2 x6 min, PT present to discuss progress       Lumbar Exercises: Seated   Sit to Stand  5 reps;Limitations    Sit to Stand Limitations  BUE press into thighs, standing on foam pad       Knee/Hip Exercises: Seated   Other Seated Knee/Hip Exercises  seated ankle circles x20 reps each     Marching  Both;2 sets;10 reps;Weights    Marching Limitations  3#      Manual Therapy   Myofascial Release  trigger point release Rt glute max, STM Rt lumbar paraspinals           Balance Exercises - 04/23/18 1126      Balance Exercises: Standing   Standing Eyes Opened  Narrow base of support (BOS);Foam/compliant surface;3 reps;30 secs    Standing Eyes Closed  Narrow base of support (BOS);Foam/compliant surface;2 reps;30 secs    Tandem Stance  Eyes open;1 rep;30 secs        PT Education - 04/23/18 1154    Education provided  Yes    Education Details  use of tennis ball for massage at home     Person(s) Educated  Patient    Methods  Explanation;Verbal cues    Comprehension  Returned demonstration;Verbalized understanding       PT Short Term Goals - 04/01/18 1212      PT SHORT TERM GOAL #1   Title  verbalize understanding of posture/body mechanics to decrease risk of reinjury    Status  Achieved      PT SHORT TERM GOAL #2   Title  improve functional strength by demonstrating ability to stand without UE support at least 3/5 trials    Status  Achieved      PT SHORT TERM GOAL #3   Title  improve timed up and go to < 20 sec for improved functional mobility    Baseline  9 sec     Period  Weeks    Status  Achieved        PT Long Term Goals - 04/01/18 1212      PT LONG TERM GOAL #1   Title  pt will be independent with an advanced HEP to allow for maintenance of her strength, ROM and endurance after discharge from PT.     Status  On-going      PT LONG TERM GOAL #2   Title  demonstrate improved functional  strength by performing 5x STS in < 20 sec withou UE support     Baseline  9 sec    Status  Achieved      PT LONG TERM GOAL #3   Title  Pt will have score atleast 52 or higher on the BERG balance test, to place her at a low risk of falling and causing injury to herself throughout the day.     Baseline  41/56    Time  4    Period  Weeks    Status  Revised      PT LONG TERM GOAL #4   Title  amb > 250' with no more than SPC on various indoor/outdoor surfaces modified independently for improved function    Baseline  RW indoors and outdoors safely    Status  Partially Met      PT LONG TERM GOAL #5   Title  report pain < 4/10 with activity for improved functional mobility    Baseline  6/10 by the evening     Status  Partially Met      PT LONG TERM GOAL #6   Title  Pt will demo improved Rt hip strength to atleast 4/5 MMT which will improve her safety with ambulation and other activity throughout the day.    Time  4    Period  Weeks    Status  Partially Met            Plan - 04/23/18 1155    Clinical Impression Statement  Pt has been increasing HEP adherence over the past several weeks and reports minimal issues with this. She does arrive with dull ache in her Rt low back, but responded well to manual soft tissue release. Therapist educated pt on self-release with a tennis ball at home and pt verbalized understanding of this. End of session pt reported pain improved to 0/10. Will continue with current POC to progress LE strength, proprioception and use of manual techniques as needed for pain control.     Rehab Potential  Good    PT Frequency  2x / week    PT Duration  4 weeks    PT Treatment/Interventions  ADLs/Self Care Home Management;Cryotherapy;Electrical Stimulation;Moist Heat;Traction;Therapeutic exercise;Therapeutic activities;Functional mobility training;Stair training;Gait training;DME Instruction;Ultrasound;Balance training;Neuromuscular re-education;Patient/family education;Manual techniques;Taping;Dry needling    PT Next Visit Plan  f/u  on tennis ball; tandem on foam, dynamic balance activity; trunk strength in sitting    PT Home Exercise Plan  Access Code: [email or web address]    Consulted and Agree with Plan of Care  Patient       Patient will benefit from skilled therapeutic intervention in order to improve the following deficits and impairments:  Abnormal gait, Decreased balance, Decreased cognition, Decreased knowledge of use of DME, Decreased mobility, Decreased safety awareness, Impaired flexibility, Postural dysfunction, Improper body mechanics, Impaired perceived functional ability, Difficulty walking, Increased muscle spasms, Pain, Increased fascial restricitons, Decreased strength, Decreased endurance  Visit Diagnosis: Unsteadiness on feet  History of falling  Chronic right-sided low back pain without sciatica  Muscle weakness (generalized)     Problem List Patient Active Problem List   Diagnosis Date Noted  . Chest pain 01/24/2018  . Liver lesion 11/10/2017  . Nausea & vomiting 10/31/2017  . Chronic daily headache 07/08/2017  . Memory changes 03/06/2017  . Lumbar stenosis with neurogenic claudication 03/20/2016  . Scalp pain 10/22/2015  . Post-traumatic headache 10/11/2015  . Routine health maintenance 03/06/2013  . Macular degeneration of right eye 03/03/2013  .  Glaucoma 03/03/2013  . Hyperlipidemia 03/14/2009  . Depression 10/19/2007  . Essential hypertension 10/19/2007  . GASTROESOPHAGEAL REFLUX DISEASE 10/19/2007  . Osteoarthritis 10/19/2007  . STRESS INCONTINENCE 10/19/2007  . Allergic rhinitis 10/19/2007     12:00 PM,04/23/18 Sherol Dade PT, DPT [facility]  [facility] [address] [place], [place], [address] Phone: [phone]   Fax:  [phone]  Name: [name] MRN: [id number] Date of Birth: 10/23/1935

## 2018-04-26 ENCOUNTER — Other Ambulatory Visit: Payer: Self-pay | Admitting: Obstetrics & Gynecology

## 2018-04-26 NOTE — Telephone Encounter (Signed)
Medication refill request: [email or web address] 0.5% Last AEX:  03/26/17 Next AEX: 05/14/18 Last MMG (if hormonal medication request): 12/22/17 Bi-rads category 2 begin  Refill authorized: Please advise

## 2018-04-27 ENCOUNTER — Encounter: Payer: Self-pay | Admitting: Physical Therapy

## 2018-04-27 ENCOUNTER — Ambulatory Visit: Payer: Medicare Other | Admitting: Physical Therapy

## 2018-04-27 DIAGNOSIS — M545 Low back pain, unspecified: Secondary | ICD-10-CM

## 2018-04-27 DIAGNOSIS — R2681 Unsteadiness on feet: Secondary | ICD-10-CM

## 2018-04-27 DIAGNOSIS — G8929 Other chronic pain: Secondary | ICD-10-CM

## 2018-04-27 DIAGNOSIS — Z9181 History of falling: Secondary | ICD-10-CM

## 2018-04-27 DIAGNOSIS — M6281 Muscle weakness (generalized): Secondary | ICD-10-CM

## 2018-04-27 NOTE — Therapy (Signed)
[facility] [address] [place], [place], [address] Phone: [phone]   Fax:  [phone]  Physical Therapy Treatment  Patient Details  Name: [name] MRN: [id number] Date of Birth: 21-May-1936 Referring Provider: Jovita Gamma, MD   Encounter Date: 04/27/2018  PT End of Session - 04/27/18 1146    Visit Number  31    Date for PT Re-Evaluation  05/11/18    Authorization Type  [employer] Medicare     Authorization - Visit Number  5    Authorization - Number of Visits  10    PT Start Time  9628    PT Stop Time  1225    PT Time Calculation (min)  40 min    Activity Tolerance  Patient tolerated treatment well;No increased pain    Behavior During Therapy  WFL for tasks assessed/performed       Past Medical History:  Diagnosis Date  . Anxiety   . Colon polyps   . Complication of anesthesia    takes longer to wake up- admitted overnight after colonoscopy  . Depression   . GERD (gastroesophageal reflux disease)   . Glaucoma   . Headache    migraines  . Hx: UTI (urinary tract infection)   . Hypercholesteremia   . Hypertension   . Lichen simplex chronicus   . Lumbar stenosis with neurogenic claudication   . Macular degeneration   . Osteoarthritis   . Osteopenia    of the hip  . PONV (postoperative nausea and vomiting)   . Stress incontinence, female   . Unsteady gait   . Urinary urgency   . Wears glasses   . Whooping cough    as a baby    Past Surgical History:  Procedure Laterality Date  . ABDOMINAL HYSTERECTOMY     ovaries remain  . ANTERIOR LAT LUMBAR FUSION N/A 10/26/2017   Procedure: LUMBAR THREE- LUMBAR FOUR, LUMBAR FOUR- LUMBAR FIVE ANTEROLATERAL LUMBAR INTERBODY ARTHRODESIS;  Surgeon: Jovita Gamma, MD;  Location: [facility];  Service: Neurosurgery;  Laterality: N/A;  LUMBAR 3- LUMBAR 4, LUMBAR 4- LUMBAR 5 ANTEROLATERAL LUMBAR INTERBODY ARTHRODESIS, LUMBAR 3- LUMBAR 4, LUMBAR 4- LUMBAR 5  PERCUTANEOUS PEDICLE SCREW FIXATION  . APPENDECTOMY    . BLADDER REPAIR     x two  . CHOLECYSTECTOMY    . COLONOSCOPY    . EYE SURGERY  11/14, 12/14   Macular Dengeneration, Glaucoma  . EYE SURGERY  2016   left eye  . LESION REMOVAL Right 06/28/2013   Procedure: EXCISION 3 cm right labial sebaceous cyst;  Surgeon: Lyman Speller, MD;  Location: [facility];  Service: Gynecology;  Laterality: Right;  . LUMBAR LAMINECTOMY/DECOMPRESSION MICRODISCECTOMY Right 03/20/2016   Procedure: Right - Lumbar four-five lumbar laminotomy, foraminotomy, and possible microdiscectomy;  Surgeon: Jovita Gamma, MD;  Location: [facility];  Service: Neurosurgery;  Laterality: Right;  right  . LUMBAR PERCUTANEOUS PEDICLE SCREW 2 LEVEL  10/26/2017   Procedure: LUMBAR THREE- LUMBAR FOUR, LUMBAR FOUR- LUMBAR FIVE PERCUTANEOUS PEDICLE SCREW FIXATION;  Surgeon: Jovita Gamma, MD;  Location: [facility];  Service: Neurosurgery;;  . MOHS SURGERY     procedure to remove basal cell  . TONSILLECTOMY AND ADENOIDECTOMY    . URETHRAL SLING  2007  . URETHRAL SLING  1/12   midurethral   . vertebroplasty secondary to traumatic compression fracture      There were no vitals filed for this visit.  Subjective Assessment - 04/27/18 1113  Subjective  Pt reports that things are going well. She has been trying to use her pillow in her low back.     Patient Stated Goals  improve Rt leg strength    Currently in Pain?  Yes    Pain Score  3     Pain Location  --   Rt low back   Pain Descriptors / Indicators  Aching;Sore    Pain Onset  More than a month ago                       [facility] Adult PT Treatment/Exercise - 04/27/18 0001      Lumbar Exercises: Aerobic   Nustep  L2 x6 min, PT present to discuss tennis ball placement at home       Lumbar Exercises: Seated   Other Seated Lumbar Exercises  BUE reach outside BOS in various directions x10 reps; BUE pressdown with yellow TB 2x10 reps     Other Seated Lumbar  Exercises  low rows with red TB 2x10 reps with cues for technique       Manual Therapy   Myofascial Release  trigger point release Rt piriformis, contract relax stretch x3 reps           Balance Exercises - 04/27/18 1125      Balance Exercises: Standing   Tandem Gait  Forward;2 reps   50f one way, with SPC   Other Standing Exercises  NBOS on foam with UE diagonal reach x15 reps, CGA; NBOS x30 sec eyes closed        PT Education - 04/27/18 1146    Education provided  Yes    Education Details  placement of tennis ball for self massage at home    Person(s) Educated  Patient    Methods  Explanation;Verbal cues    Comprehension  Verbalized understanding       PT Short Term Goals - 04/01/18 1212      PT SHORT TERM GOAL #1   Title  verbalize understanding of posture/body mechanics to decrease risk of reinjury    Status  Achieved      PT SHORT TERM GOAL #2   Title  improve functional strength by demonstrating ability to stand without UE support at least 3/5 trials    Status  Achieved      PT SHORT TERM GOAL #3   Title  improve timed up and go to < 20 sec for improved functional mobility    Baseline  9 sec     Period  Weeks    Status  Achieved        PT Long Term Goals - 04/01/18 1212      PT LONG TERM GOAL #1   Title  pt will be independent with an advanced HEP to allow for maintenance of her strength, ROM and endurance after discharge from PT.     Status  On-going      PT LONG TERM GOAL #2   Title  demonstrate improved functional strength by performing 5x STS in < 20 sec withou UE support    Baseline  9 sec    Status  Achieved      PT LONG TERM GOAL #3   Title  Pt will have score atleast 52 or higher on the BERG balance test, to place her at a low risk of falling and causing injury to herself throughout the day.     Baseline  41/56  Time  4    Period  Weeks    Status  Revised      PT LONG TERM GOAL #4   Title  amb > 250' with no more than SPC on various  indoor/outdoor surfaces modified independently for improved function    Baseline  RW indoors and outdoors safely    Status  Partially Met      PT LONG TERM GOAL #5   Title  report pain < 4/10 with activity for improved functional mobility    Baseline  6/10 by the evening     Status  Partially Met      PT LONG TERM GOAL #6   Title  Pt will demo improved Rt hip strength to atleast 4/5 MMT which will improve her safety with ambulation and other activity throughout the day.    Time  4    Period  Weeks    Status  Partially Met            Plan - 04/27/18 1147    Clinical Impression Statement  Pt arrived with overall decrease in pain intensity since her last session. She was able to use her tennis ball at home to decrease muscle spasm and pain. Session also focused on improving balance and trunk strength. Pt was able to walk tandem with her [facility] which was an improvement from previous sessions without LOB. She continues to respond well to manual treatment to the piriformis with reports of resolved pain end of session. Will continue with current POC to progress LE/trunk strength, proprioception and safety with activity.     Rehab Potential  Good    PT Frequency  2x / week    PT Duration  4 weeks    PT Treatment/Interventions  ADLs/Self Care Home Management;Cryotherapy;Electrical Stimulation;Moist Heat;Traction;Therapeutic exercise;Therapeutic activities;Functional mobility training;Stair training;Gait training;DME Instruction;Ultrasound;Balance training;Neuromuscular re-education;Patient/family education;Manual techniques;Taping;Dry needling    PT Next Visit Plan  f/u on tennis ball; piriformis stretch/strength; tandem on foam, dynamic balance activity; trunk strength in sitting    PT Home Exercise Plan  Access Code: [email or web address]    Consulted and Agree with Plan of Care  Patient       Patient will benefit from skilled therapeutic intervention in order to improve the following deficits and  impairments:  Abnormal gait, Decreased balance, Decreased cognition, Decreased knowledge of use of DME, Decreased mobility, Decreased safety awareness, Impaired flexibility, Postural dysfunction, Improper body mechanics, Impaired perceived functional ability, Difficulty walking, Increased muscle spasms, Pain, Increased fascial restricitons, Decreased strength, Decreased endurance  Visit Diagnosis: Unsteadiness on feet  History of falling  Chronic right-sided low back pain without sciatica  Muscle weakness (generalized)     Problem List Patient Active Problem List   Diagnosis Date Noted  . Chest pain 01/24/2018  . Liver lesion 11/10/2017  . Nausea & vomiting 10/31/2017  . Chronic daily headache 07/08/2017  . Memory changes 03/06/2017  . Lumbar stenosis with neurogenic claudication 03/20/2016  . Scalp pain 10/22/2015  . Post-traumatic headache 10/11/2015  . Routine health maintenance 03/06/2013  . Macular degeneration of right eye 03/03/2013  . Glaucoma 03/03/2013  . Hyperlipidemia 03/14/2009  . Depression 10/19/2007  . Essential hypertension 10/19/2007  . GASTROESOPHAGEAL REFLUX DISEASE 10/19/2007  . Osteoarthritis 10/19/2007  . STRESS INCONTINENCE 10/19/2007  . Allergic rhinitis 10/19/2007     12:29 PM,04/27/18 Sherol Dade PT, DPT [place] [facility] [address], [place], [place],  [address] Phone: [phone]   Fax:  [phone]  Name: NICIE MILAN MRN: [id number] Date of Birth: [birth year]

## 2018-05-04 ENCOUNTER — Encounter: Payer: Medicare Other | Admitting: Physical Therapy

## 2018-05-06 ENCOUNTER — Ambulatory Visit: Payer: Medicare Other | Admitting: Physical Therapy

## 2018-05-11 ENCOUNTER — Ambulatory Visit: Payer: Medicare Other | Attending: Neurosurgery | Admitting: Physical Therapy

## 2018-05-11 DIAGNOSIS — R2681 Unsteadiness on feet: Secondary | ICD-10-CM | POA: Diagnosis not present

## 2018-05-11 DIAGNOSIS — G8929 Other chronic pain: Secondary | ICD-10-CM | POA: Diagnosis present

## 2018-05-11 DIAGNOSIS — M6281 Muscle weakness (generalized): Secondary | ICD-10-CM | POA: Diagnosis present

## 2018-05-11 DIAGNOSIS — Z9181 History of falling: Secondary | ICD-10-CM | POA: Diagnosis present

## 2018-05-11 DIAGNOSIS — M545 Low back pain: Secondary | ICD-10-CM | POA: Insufficient documentation

## 2018-05-11 NOTE — Patient Instructions (Signed)
Access Code: [email or web address]  Exercises Hooklying Isometric Hip Flexion - 10 reps - 2x daily - 5x weekly  Bridge with Resistance - 15 reps - 1 sets - 2 sec hold - 2x daily - 5x weekly  Seated Hip Abduction - 10 reps - 2 sets - 1x daily - 5x weekly  Standing Hip Extension - 10 reps - 2 sets - 2x daily - 5x weekly  Standing Heel Raise with Support - 20 reps - 1 sets - 2x daily - 5x weekly  Standing March with Unilateral Counter Support - 10 reps - 2x daily - 5x weekly  Tandem Stance with Support    [facility] [address], [place], [place] [address] Phone # [phone] Fax [phone]

## 2018-05-11 NOTE — Therapy (Signed)
[facility] [address] [place], [place], [address] Phone: [phone]   Fax:  [phone]  Physical Therapy Treatment/Discharge  Patient Details  Name: [name] MRN: [id number] Date of Birth: Jul 19, 1936 Referring Provider (PT): Jovita Gamma, MD    Encounter Date: 05/11/2018  PT End of Session - 05/11/18 1358    Visit Number  32    Date for PT Re-Evaluation  05/11/18    Authorization Type  [employer] Medicare     Authorization - Visit Number  6    Authorization - Number of Visits  10    PT Start Time  4401    PT Stop Time  0272    PT Time Calculation (min)  43 min    Activity Tolerance  Patient tolerated treatment well;No increased pain    Behavior During Therapy  WFL for tasks assessed/performed       Past Medical History:  Diagnosis Date  . Anxiety   . Colon polyps   . Complication of anesthesia    takes longer to wake up- admitted overnight after colonoscopy  . Depression   . GERD (gastroesophageal reflux disease)   . Glaucoma   . Headache    migraines  . Hx: UTI (urinary tract infection)   . Hypercholesteremia   . Hypertension   . Lichen simplex chronicus   . Lumbar stenosis with neurogenic claudication   . Macular degeneration   . Osteoarthritis   . Osteopenia    of the hip  . PONV (postoperative nausea and vomiting)   . Stress incontinence, female   . Unsteady gait   . Urinary urgency   . Wears glasses   . Whooping cough    as a baby    Past Surgical History:  Procedure Laterality Date  . ABDOMINAL HYSTERECTOMY     ovaries remain  . ANTERIOR LAT LUMBAR FUSION N/A 10/26/2017   Procedure: LUMBAR THREE- LUMBAR FOUR, LUMBAR FOUR- LUMBAR FIVE ANTEROLATERAL LUMBAR INTERBODY ARTHRODESIS;  Surgeon: Jovita Gamma, MD;  Location: [facility];  Service: Neurosurgery;  Laterality: N/A;  LUMBAR 3- LUMBAR 4, LUMBAR 4- LUMBAR 5 ANTEROLATERAL LUMBAR INTERBODY ARTHRODESIS, LUMBAR 3- LUMBAR 4, LUMBAR 4-  LUMBAR 5 PERCUTANEOUS PEDICLE SCREW FIXATION  . APPENDECTOMY    . BLADDER REPAIR     x two  . CHOLECYSTECTOMY    . COLONOSCOPY    . EYE SURGERY  11/14, 12/14   Macular Dengeneration, Glaucoma  . EYE SURGERY  2016   left eye  . LESION REMOVAL Right 06/28/2013   Procedure: EXCISION 3 cm right labial sebaceous cyst;  Surgeon: Lyman Speller, MD;  Location: [facility];  Service: Gynecology;  Laterality: Right;  . LUMBAR LAMINECTOMY/DECOMPRESSION MICRODISCECTOMY Right 03/20/2016   Procedure: Right - Lumbar four-five lumbar laminotomy, foraminotomy, and possible microdiscectomy;  Surgeon: Jovita Gamma, MD;  Location: [facility];  Service: Neurosurgery;  Laterality: Right;  right  . LUMBAR PERCUTANEOUS PEDICLE SCREW 2 LEVEL  10/26/2017   Procedure: LUMBAR THREE- LUMBAR FOUR, LUMBAR FOUR- LUMBAR FIVE PERCUTANEOUS PEDICLE SCREW FIXATION;  Surgeon: Jovita Gamma, MD;  Location: [facility];  Service: Neurosurgery;;  . MOHS SURGERY     procedure to remove basal cell  . TONSILLECTOMY AND ADENOIDECTOMY    . URETHRAL SLING  2007  . URETHRAL SLING  1/12   midurethral   . vertebroplasty secondary to traumatic compression fracture      There were no vitals filed for this visit.  Subjective Assessment - 05/11/18  1110    Subjective  Pt reports that she feels much more sturdy with her walker. She feels like she can do much more in the community now without as much difficulty. She has some intermittent back pain by the end of the days. She has been trying to keep up with her HEP but has had alot going on at home.     Patient Stated Goals  improve Rt leg strength    Currently in Pain?  No/denies    Pain Onset  More than a month ago         [facility] PT Assessment - 05/11/18 0001      Assessment   Medical Diagnosis  Spondylosis without myelopathy or radiculopathy, lumbar region    Referring Provider (PT)  Jovita Gamma, MD     Onset Date/Surgical Date  10/26/17    Next MD Visit  --    Prior  Therapy  none      Precautions   Precautions  Back    Required Braces or Orthoses  Spinal Brace    Spinal Brace  Lumbar corset;Applied in sitting position      Restrictions   Weight Bearing Restrictions  No      Balance Screen   Has the patient fallen in the past 6 months  No    How many times?  --   none since last assessment    Has the patient had a decrease in activity level because of a fear of falling?   Yes    Is the patient reluctant to leave their home because of a fear of falling?   No      Home Environment   Living Environment  Private residence    Living Arrangements  Spouse/significant other    Type of [place] to enter    Entrance Stairs-Number of Steps  2    [place]  One level    [place] - 2 wheels;Bedside commode      Prior Function   Level of Independence  Independent    Vocation  Retired    Leisure  play sudoku/puzzles on iphone, watch TV, no regular exercise      Cognition   Overall Cognitive Status  No family/caregiver present to determine baseline cognitive functioning    Memory  Impaired    Memory Impairment  Decreased recall of new information;Decreased short term memory    Decreased Short Term Memory  Verbal basic      Observation/Other Assessments   Focus on Therapeutic Outcomes (FOTO)   44% limited       Posture/Postural Control   Posture/Postural Control  Postural limitations    Postural Limitations  Rounded Shoulders;Forward head;Decreased lumbar lordosis      Strength   Overall Strength Comments  tested in sitting only due to post-op status; suspect hip extension and abduction weakness due to functional limitations and gait abnormalities    Right Hip Flexion  5/5    Right Hip ABduction  3/5    Left Hip Flexion  5/5    Left Hip ABduction  4/5    Right Knee Flexion  5/5    Right Knee Extension  5/5    Left Knee Flexion  5/5    Left Knee Extension  5/5     Right Ankle Dorsiflexion  4/5    Left Ankle Dorsiflexion  4/5  Flexibility   Soft Tissue Assessment /Muscle Length  yes    Hamstrings  --    Piriformis  --      Bed Mobility   Bed Mobility  Sit to Sidelying Left      Transfers   Five time sit to stand comments   9 sec, minimal UE support       Ambulation/Gait   Gait Comments  Pt ambulating with RW      Standardized Balance Assessment   Standardized Balance Assessment  Timed Up and Go Test      Berg Balance Test   Sit to Stand  Able to stand without using hands and stabilize independently    Standing Unsupported  Able to stand safely 2 minutes    Sitting with Back Unsupported but Feet Supported on Floor or Stool  Able to sit safely and securely 2 minutes    Stand to Sit  Sits safely with minimal use of hands    Transfers  Able to transfer safely, minor use of hands    Standing Unsupported with Eyes Closed  Able to stand 10 seconds safely    Standing Ubsupported with Feet Together  Able to place feet together independently and stand for 1 minute with supervision    From Standing, Reach Forward with Outstretched Arm  Can reach forward >5 cm safely (2")    From Standing Position, Pick up Object from Floor  Able to pick up shoe safely and easily    From Standing Position, Turn to Look Behind Over each Shoulder  Looks behind one side only/other side shows less weight shift    Turn 360 Degrees  Able to turn 360 degrees safely but slowly    Standing Unsupported, Alternately Place Feet on Step/Stool  Able to complete >2 steps/needs minimal assist    Standing Unsupported, One Foot in Front  Able to plae foot ahead of the other independently and hold 30 seconds    Standing on One Leg  Tries to lift leg/unable to hold 3 seconds but remains standing independently   Lt 3 sec, Rt able to lift leg    Total Score  43      Timed Up and Go Test   Normal TUG (seconds)  13   RW   TUG Comments  without AD x11 sec (therapist Mina to prevent  fall during change in direction)                    [facility] Adult PT Treatment/Exercise - 05/11/18 0001      Exercises   Exercises  Other Exercises    Other Exercises   therapist reviewing HEP additions, frequency and benefits of exercises       Knee/Hip Exercises: Seated   Other Seated Knee/Hip Exercises  seated piriformis stretch on Rt with tennis ball self massage to Rt glute for 2-3 min             PT Education - 05/11/18 1355    Education provided  Yes    Education Details  progress towards goals; importance of continuing with HEP moving forward; self massage with tennis ball     Person(s) Educated  Patient    Methods  Explanation;Handout;Verbal cues    Comprehension  Verbalized understanding       PT Short Term Goals - 04/01/18 1212      PT SHORT TERM GOAL #1   Title  verbalize understanding of posture/body mechanics to decrease risk of reinjury  Status  Achieved      PT SHORT TERM GOAL #2   Title  improve functional strength by demonstrating ability to stand without UE support at least 3/5 trials    Status  Achieved      PT SHORT TERM GOAL #3   Title  improve timed up and go to < 20 sec for improved functional mobility    Baseline  9 sec     Period  Weeks    Status  Achieved        PT Long Term Goals - 05/11/18 1136      PT LONG TERM GOAL #1   Title  pt will be independent with an advanced HEP to allow for maintenance of her strength, ROM and endurance after discharge from PT.     Status  On-going      PT LONG TERM GOAL #2   Title  demonstrate improved functional strength by performing 5x STS in < 20 sec withou UE support    Baseline  9 sec    Status  Achieved      PT LONG TERM GOAL #3   Title  Pt will have score atleast 52 or higher on the BERG balance test, to place her at a low risk of falling and causing injury to herself throughout the day.     Baseline  43/56    Time  4    Period  Weeks    Status  Partially Met      PT LONG  TERM GOAL #4   Title  amb > 250' with no more than SPC on various indoor/outdoor surfaces modified independently for improved function    Baseline  RW indoors and outdoors safely    Status  Partially Met      PT LONG TERM GOAL #5   Title  report pain < 4/10 with activity for improved functional mobility    Baseline  2-3/10 by the evening     Status  Achieved      PT LONG TERM GOAL #6   Title  Pt will demo improved Rt hip strength to atleast 4/5 MMT which will improve her safety with ambulation and other activity throughout the day.    Time  4    Period  Weeks    Status  Achieved            Plan - 05/11/18 1358    Clinical Impression Statement  Pt was discharged this visit having met all of her short term goals and all but 2 of her long term goals since beginning PT. She reports signifincat improvements in her low back pain overall, with noted 2 or 3/10 pain report by the end of the day. In addition, pt has increased BLE strength to 4/5 MMT and her BERG balance score increased from 32/56 to 43/56 which transitions her out of the high risk of falling category into the significant risk of category. Even though the pt's strength and proprioception have improved, she still requires an assistive device for increased safety in the community. Pt is comfortable using her RW and has reported no LOB with this. Due to family heath decline and other things going on, she is comfortable with d/c at this time and feels confident in her ability to continue with HEP moving forward.     Rehab Potential  Good    PT Frequency  2x / week    PT Duration  4 weeks    PT Treatment/Interventions  ADLs/Self Care Home Management;Cryotherapy;Electrical Stimulation;Moist Heat;Traction;Therapeutic exercise;Therapeutic activities;Functional mobility training;Stair training;Gait training;DME Instruction;Ultrasound;Balance training;Neuromuscular re-education;Patient/family education;Manual techniques;Taping;Dry needling     PT Next Visit Plan  d/c home with HEP    PT Home Exercise Plan  Access Code: [email or web address]    Consulted and Agree with Plan of Care  Patient       Patient will benefit from skilled therapeutic intervention in order to improve the following deficits and impairments:  Abnormal gait, Decreased balance, Decreased cognition, Decreased knowledge of use of DME, Decreased mobility, Decreased safety awareness, Impaired flexibility, Postural dysfunction, Improper body mechanics, Impaired perceived functional ability, Difficulty walking, Increased muscle spasms, Pain, Increased fascial restricitons, Decreased strength, Decreased endurance  Visit Diagnosis: Unsteadiness on feet  History of falling  Chronic right-sided low back pain without sciatica  Muscle weakness (generalized)     Problem List Patient Active Problem List   Diagnosis Date Noted  . Chest pain 01/24/2018  . Liver lesion 11/10/2017  . Nausea & vomiting 10/31/2017  . Chronic daily headache 07/08/2017  . Memory changes 03/06/2017  . Lumbar stenosis with neurogenic claudication 03/20/2016  . Scalp pain 10/22/2015  . Post-traumatic headache 10/11/2015  . Routine health maintenance 03/06/2013  . Macular degeneration of right eye 03/03/2013  . Glaucoma 03/03/2013  . Hyperlipidemia 03/14/2009  . Depression 10/19/2007  . Essential hypertension 10/19/2007  . GASTROESOPHAGEAL REFLUX DISEASE 10/19/2007  . Osteoarthritis 10/19/2007  . STRESS INCONTINENCE 10/19/2007  . Allergic rhinitis 10/19/2007    PHYSICAL THERAPY DISCHARGE SUMMARY  Visits from Start of Care: 32  Current functional level related to goals / functional outcomes: See above for more details    Remaining deficits: See above for more details    Education / Equipment: See above for more details   Plan: Patient agrees to discharge.  Patient goals were partially met. Patient is being discharged due to being pleased with the current functional level.  ?????            4:58 PM,05/11/18 Sherol Dade PT, [place] [facility] [place]  [facility] [address], [place], [place], [address] Phone: [phone]   Fax:  [phone]  Name: [name] MRN: [id number] Date of Birth: 1936/04/10

## 2018-05-13 ENCOUNTER — Encounter: Payer: Medicare Other | Admitting: Physical Therapy

## 2018-05-14 ENCOUNTER — Other Ambulatory Visit: Payer: Self-pay

## 2018-05-14 ENCOUNTER — Ambulatory Visit (INDEPENDENT_AMBULATORY_CARE_PROVIDER_SITE_OTHER): Payer: Medicare Other | Admitting: Obstetrics & Gynecology

## 2018-05-14 ENCOUNTER — Encounter

## 2018-05-14 ENCOUNTER — Encounter: Payer: Self-pay | Admitting: Obstetrics & Gynecology

## 2018-05-14 VITALS — BP 110/66 | HR 68 | Resp 16 | Ht 60.0 in | Wt 164.0 lb

## 2018-05-14 DIAGNOSIS — R102 Pelvic and perineal pain: Secondary | ICD-10-CM

## 2018-05-14 DIAGNOSIS — Z01419 Encounter for gynecological examination (general) (routine) without abnormal findings: Secondary | ICD-10-CM

## 2018-05-14 DIAGNOSIS — Z0000 Encounter for general adult medical examination without abnormal findings: Secondary | ICD-10-CM

## 2018-05-14 DIAGNOSIS — N8111 Cystocele, midline: Secondary | ICD-10-CM

## 2018-05-14 LAB — POCT URINALYSIS DIPSTICK
Bilirubin, UA: NEGATIVE
Blood, UA: NEGATIVE
Glucose, UA: NEGATIVE
Ketones, UA: NEGATIVE
LEUKOCYTES UA: NEGATIVE
NITRITE UA: NEGATIVE
PH UA: 6 (ref 5.0–8.0)
PROTEIN UA: NEGATIVE
UROBILINOGEN UA: 0.2 U/dL

## 2018-05-14 NOTE — Patient Instructions (Signed)
[facility]  [place], [place], [place] [address]  Phone: [phone]   [facility] [address], [place], [place] [address]  Phone: [phone]

## 2018-05-14 NOTE — Progress Notes (Signed)
82 y.o. G4P2 Married White or Caucasian female here for annual exam.  Continuing to have low back pain.  Seeing Dr. Sherwood Gambler.  Has been released from PT but feels that she needs more PT.    Denies vaginal bleeding.  Thinks she may have a UTI.  Urine is dark and there is some odor.  Denies dysuria.  Not having fever.    Husband diagnosed with bladder cancer.  Initially diagnosis a few months ago.    PCP:  Dr. Yong Channel  Patient's last menstrual period was 08/11/1974.          Sexually active: No.  The current method of family planning is status post hysterectomy.    Exercising: Yes.    PT Smoker:  no  Health Maintenance: Pap:  2005 Normal  History of abnormal Pap:  no MMG:  12/22/17 BIRADS2:Benign  Colonoscopy:  2014 with Dr. Earlean Shawl.  He recommended follow up in five years. BMD:   03/27/17 osteopenia  TDaP:  2016 Pneumonia vaccine(s):  2017 Shingrix: Did one last year.  Had a significant reaction.  Will not received the second dose Hep C testing: n/a Screening Labs: PCP   reports that she quit smoking about 52 years ago. She has never used smokeless tobacco. She reports that she drinks about 1.0 standard drinks of alcohol per week. She reports that she does not use drugs.  Past Medical History:  Diagnosis Date  . Anxiety   . Colon polyps   . Complication of anesthesia    takes longer to wake up- admitted overnight after colonoscopy  . Depression   . GERD (gastroesophageal reflux disease)   . Glaucoma   . Headache    migraines  . Hx: UTI (urinary tract infection)   . Hypercholesteremia   . Hypertension   . Lichen simplex chronicus   . Lumbar stenosis with neurogenic claudication   . Macular degeneration   . Osteoarthritis   . Osteopenia    of the hip  . PONV (postoperative nausea and vomiting)   . Stress incontinence, female   . Unsteady gait   . Wears glasses   . Whooping cough    as a baby    Past Surgical History:  Procedure Laterality Date  . ABDOMINAL  HYSTERECTOMY     ovaries remain  . ANTERIOR LAT LUMBAR FUSION N/A 10/26/2017   Procedure: LUMBAR THREE- LUMBAR FOUR, LUMBAR FOUR- LUMBAR FIVE ANTEROLATERAL LUMBAR INTERBODY ARTHRODESIS;  Surgeon: Jovita Gamma, MD;  Location: [facility];  Service: Neurosurgery;  Laterality: N/A;  LUMBAR 3- LUMBAR 4, LUMBAR 4- LUMBAR 5 ANTEROLATERAL LUMBAR INTERBODY ARTHRODESIS, LUMBAR 3- LUMBAR 4, LUMBAR 4- LUMBAR 5 PERCUTANEOUS PEDICLE SCREW FIXATION  . APPENDECTOMY    . BLADDER REPAIR     x two  . CHOLECYSTECTOMY    . COLONOSCOPY    . EYE SURGERY  11/14, 12/14   Macular Dengeneration, Glaucoma  . EYE SURGERY  2016   left eye  . LESION REMOVAL Right 06/28/2013   Procedure: EXCISION 3 cm right labial sebaceous cyst;  Surgeon: Lyman Speller, MD;  Location: [facility];  Service: Gynecology;  Laterality: Right;  . LUMBAR LAMINECTOMY/DECOMPRESSION MICRODISCECTOMY Right 03/20/2016   Procedure: Right - Lumbar four-five lumbar laminotomy, foraminotomy, and possible microdiscectomy;  Surgeon: Jovita Gamma, MD;  Location: [facility];  Service: Neurosurgery;  Laterality: Right;  right  . LUMBAR PERCUTANEOUS PEDICLE SCREW 2 LEVEL  10/26/2017   Procedure: LUMBAR THREE- LUMBAR FOUR, LUMBAR FOUR- LUMBAR FIVE PERCUTANEOUS PEDICLE SCREW FIXATION;  Surgeon: Jovita Gamma, MD;  Location: [facility];  Service: Neurosurgery;;  . MOHS SURGERY     procedure to remove basal cell  . TONSILLECTOMY AND ADENOIDECTOMY    . URETHRAL SLING  2007  . URETHRAL SLING  1/12   midurethral   . vertebroplasty secondary to traumatic compression fracture      Current Outpatient Medications  Medication Sig Dispense Refill  . acetaminophen (TYLENOL) 500 MG tablet Take 500 mg by mouth daily as needed for mild pain or headache.     . ALPRAZolam (XANAX) 0.5 MG tablet Take 0.5-1 tablets (0.25-0.5 mg total) by mouth 2 (two) times daily as needed. for anxiety 20 tablet 0  . aspirin 81 MG tablet Take 81 mg by mouth daily.    Marland Kitchen azelastine (ASTELIN)  0.1 % nasal spray Place 1 spray into both nostrils 2 (two) times daily. Use in each nostril as directed 30 mL 5  . brimonidine-timolol (COMBIGAN) 0.2-0.5 % ophthalmic solution Place 1 drop into both eyes every 12 (twelve) hours.    . clobetasol ointment (TEMOVATE) 0.05 % APPLY TO AFFECTED AREA TWICE DAILY DO NOT USE MORE THAN 7 DAYS IN A ROW 30 g 0  . ezetimibe-simvastatin (VYTORIN) 10-20 MG tablet Take 1 tablet by mouth daily. 90 tablet 3  . ibuprofen (ADVIL,MOTRIN) 200 MG tablet Take 200 mg by mouth every 8 (eight) hours as needed.    Marland Kitchen ketoconazole (NIZORAL) 2 % cream Apply 1 application topically daily. For circular area on left lower leg 30 g 0  . nadolol (CORGARD) 40 MG tablet Take 1 tablet (40 mg total) by mouth at bedtime. 90 tablet 1  . omeprazole (PRILOSEC) 20 MG capsule Take 20 mg by mouth daily before breakfast.    . sodium chloride (OCEAN) 0.65 % SOLN nasal spray Place 1 spray into both nostrils 2 (two) times daily as needed for congestion.    . traZODone (DESYREL) 100 MG tablet TAKE 1/4 TO 1/2 TABLET BY MOUTH AT BEDTIME IF NEEDED FOR SLEEP 45 tablet 1  . triamcinolone ointment (KENALOG) 0.5 % APPLY EXTERNALLY TO THE AFFECTED AREA TWICE DAILY 30 g 0  . venlafaxine XR (EFFEXOR-XR) 150 MG 24 hr capsule TAKE 1 CAPSULE BY MOUTH TWICE A DAY 180 capsule 1   No current facility-administered medications for this visit.     Family History  Problem Relation Age of Onset  . Congestive Heart Failure Mother   . Thyroid disease Mother   . Osteoporosis Mother   . Alcoholism Mother   . Prostate cancer Father   . Breast cancer Maternal Grandmother   . Hypertension Sister   . Thyroid disease Sister   . Cancer Sister        brain ?  Marland Kitchen Rheum arthritis Daughter     Review of Systems  Cardiovascular: Positive for chest pain and leg swelling.  Genitourinary: Positive for frequency.  All other systems reviewed and are negative.   Exam:   BP 110/66 (BP Location: Right Arm, Patient Position:  Sitting, Cuff Size: Normal)   Pulse 68   Resp 16   Ht 5' (1.524 m)   Wt 164 lb (74.4 kg)   LMP 08/11/1974   BMI 32.03 kg/m    Height: 5' (152.4 cm)  Ht Readings from Last 3 Encounters:  05/14/18 5' (1.524 m)  04/01/18 5' (1.524 m)  03/10/18 5' (1.524 m)    General appearance: alert, cooperative and appears stated age Head: Normocephalic, without obvious abnormality, atraumatic Neck: no adenopathy,  supple, symmetrical, trachea midline and thyroid normal to inspection and palpation Lungs: clear to auscultation bilaterally Breasts: normal appearance, no masses or tenderness Heart: regular rate and rhythm Abdomen: soft, non-tender; bowel sounds normal; no masses,  no organomegaly Extremities: extremities normal, atraumatic, no cyanosis or edema Skin: Skin color, texture, turgor normal. No rashes or lesions Lymph nodes: Cervical, supraclavicular, and axillary nodes normal. No abnormal inguinal nodes palpated Neurologic: Grossly normal   Pelvic: External genitalia:  no lesions, significant improvement in appearance of vulvar skin, no tissue thickening noted              Urethra:  normal appearing urethra with no masses, tenderness or lesions              Bartholins and Skenes: normal                 Vagina: normal appearing vagina with normal color and discharge, no lesions, 3rd degree cystocele              Cervix: absent              Pap taken: No. Bimanual Exam:  Uterus:  uterus absent              Adnexa: no mass, fullness, tenderness               Rectovaginal: Confirms               Anus:  normal sphincter tone, no lesions  Pt could not void and with symptoms concerning for UTI, cath u/a was performed under sterile conditions.    Chaperone was present for exam.  A:  Well Woman with normal exam PMP, no HRT Cystocele Urinary incontinencen with h/o recurrent UTI, possible symptoms today Hypertension  P:   Mammogram giudelines reviewed pap smear not indicated Urine  culture will be sent.  Dip u/a appears normal so will not treat with antibiotics at this time. Does not need RF for clobetasol ointment. Return annually or prn

## 2018-05-15 LAB — URINE CULTURE: Organism ID, Bacteria: NO GROWTH

## 2018-06-16 ENCOUNTER — Telehealth: Payer: Self-pay | Admitting: Obstetrics & Gynecology

## 2018-06-16 NOTE — Telephone Encounter (Signed)
Spoke with patient. Patient reports dysuria and urinary frequency for 1 week. Patient denies blood in urine, fever/chills. Reports chronic lower back pain, no new pain.   Advised patient OV needed today for further evaluation, patient declined. Offered morning and afternoon OV with covering provider, patient declined. Patient states she is taking her husband to his cancer treatments at 2pm, unable to make any other appointments. Patient request OV on 11/7. OV scheduled for 11/7 at 2:30pm with Dr. Sabra Heck.   ER precautions provided and reviewed with patient. Advised covering provider will review, our office will return call if any additional recommendations.  Routing to covering provider for final review. Patient is agreeable to disposition. Will close encounter  Cc: Dr. Sabra Heck

## 2018-06-16 NOTE — Telephone Encounter (Signed)
Patient believes she has a bladder infection and would like to speak with a nurse.

## 2018-06-17 ENCOUNTER — Ambulatory Visit: Payer: Medicare Other | Admitting: Obstetrics & Gynecology

## 2018-06-17 ENCOUNTER — Encounter: Payer: Self-pay | Admitting: Obstetrics & Gynecology

## 2018-06-17 VITALS — BP 108/60 | HR 68 | Resp 16 | Ht 60.0 in | Wt 165.0 lb

## 2018-06-17 DIAGNOSIS — N3000 Acute cystitis without hematuria: Secondary | ICD-10-CM

## 2018-06-17 DIAGNOSIS — R300 Dysuria: Secondary | ICD-10-CM

## 2018-06-17 LAB — POCT URINALYSIS DIPSTICK
Bilirubin, UA: NEGATIVE
GLUCOSE UA: NEGATIVE
Ketones, UA: NEGATIVE
NITRITE UA: NEGATIVE
PH UA: 5 (ref 5.0–8.0)
PROTEIN UA: NEGATIVE
UROBILINOGEN UA: 0.2 U/dL

## 2018-06-17 MED ORDER — NITROFURANTOIN MONOHYD MACRO 100 MG PO CAPS: 100.0000 mg | ORAL_CAPSULE | Freq: Two times a day (BID) | ORAL | 0 refills | Status: DC

## 2018-06-17 NOTE — Progress Notes (Signed)
GYNECOLOGY  VISIT  CC:   dysuria  HPI: 82 y.o. G4P2 Married White or Caucasian female here for dysuria that has been present a little more than one week.  Pt reports this started about last Wednesday or Thursday and has not improved.  Also having urinary urgency.  Denies hematuria, back pain or fever.  Has not really taken anything for the symptoms.  Husband having treatment for bladder cancer and not doing well with treatments so this is adding stressors for her.  GYNECOLOGIC HISTORY: Patient's last menstrual period was 08/11/1974. Contraception: hysterectomy  Menopausal hormone therapy: none  Patient Active Problem List   Diagnosis Date Noted  . Chest pain 01/24/2018  . Liver lesion 11/10/2017  . Nausea & vomiting 10/31/2017  . Chronic daily headache 07/08/2017  . Memory changes 03/06/2017  . Lumbar stenosis with neurogenic claudication 03/20/2016  . Scalp pain 10/22/2015  . Post-traumatic headache 10/11/2015  . Routine health maintenance 03/06/2013  . Macular degeneration of right eye 03/03/2013  . Glaucoma 03/03/2013  . Hyperlipidemia 03/14/2009  . Depression 10/19/2007  . Essential hypertension 10/19/2007  . GASTROESOPHAGEAL REFLUX DISEASE 10/19/2007  . Osteoarthritis 10/19/2007  . STRESS INCONTINENCE 10/19/2007  . Allergic rhinitis 10/19/2007    Past Medical History:  Diagnosis Date  . Anxiety   . Colon polyps   . Complication of anesthesia    takes longer to wake up- admitted overnight after colonoscopy  . Depression   . GERD (gastroesophageal reflux disease)   . Glaucoma   . Headache    migraines  . Hx: UTI (urinary tract infection)   . Hypercholesteremia   . Hypertension   . Lichen simplex chronicus   . Lumbar stenosis with neurogenic claudication   . Macular degeneration   . Osteoarthritis   . Osteopenia    of the hip  . PONV (postoperative nausea and vomiting)   . Stress incontinence, female   . Unsteady gait   . Wears glasses   . Whooping cough     as a baby    Past Surgical History:  Procedure Laterality Date  . ABDOMINAL HYSTERECTOMY     ovaries remain  . ANTERIOR LAT LUMBAR FUSION N/A 10/26/2017   Procedure: LUMBAR THREE- LUMBAR FOUR, LUMBAR FOUR- LUMBAR FIVE ANTEROLATERAL LUMBAR INTERBODY ARTHRODESIS;  Surgeon: Jovita Gamma, MD;  Location: [facility];  Service: Neurosurgery;  Laterality: N/A;  LUMBAR 3- LUMBAR 4, LUMBAR 4- LUMBAR 5 ANTEROLATERAL LUMBAR INTERBODY ARTHRODESIS, LUMBAR 3- LUMBAR 4, LUMBAR 4- LUMBAR 5 PERCUTANEOUS PEDICLE SCREW FIXATION  . APPENDECTOMY    . BLADDER REPAIR     x two  . CHOLECYSTECTOMY    . COLONOSCOPY    . EYE SURGERY  11/14, 12/14   Macular Dengeneration, Glaucoma  . EYE SURGERY  2016   left eye  . LESION REMOVAL Right 06/28/2013   Procedure: EXCISION 3 cm right labial sebaceous cyst;  Surgeon: Lyman Speller, MD;  Location: [facility];  Service: Gynecology;  Laterality: Right;  . LUMBAR LAMINECTOMY/DECOMPRESSION MICRODISCECTOMY Right 03/20/2016   Procedure: Right - Lumbar four-five lumbar laminotomy, foraminotomy, and possible microdiscectomy;  Surgeon: Jovita Gamma, MD;  Location: [facility];  Service: Neurosurgery;  Laterality: Right;  right  . LUMBAR PERCUTANEOUS PEDICLE SCREW 2 LEVEL  10/26/2017   Procedure: LUMBAR THREE- LUMBAR FOUR, LUMBAR FOUR- LUMBAR FIVE PERCUTANEOUS PEDICLE SCREW FIXATION;  Surgeon: Jovita Gamma, MD;  Location: [facility];  Service: Neurosurgery;;  . MOHS SURGERY     procedure to remove basal cell  .  TONSILLECTOMY AND ADENOIDECTOMY    . URETHRAL SLING  2007  . URETHRAL SLING  1/12   midurethral   . vertebroplasty secondary to traumatic compression fracture      MEDS:   Current Outpatient Medications on File Prior to Visit  Medication Sig Dispense Refill  . acetaminophen (TYLENOL) 500 MG tablet Take 500 mg by mouth daily as needed for mild pain or headache.     . ALPRAZolam (XANAX) 0.5 MG tablet Take 0.5-1 tablets (0.25-0.5 mg total) by mouth 2 (two) times  daily as needed. for anxiety 20 tablet 0  . aspirin 81 MG tablet Take 81 mg by mouth daily.    . brimonidine-timolol (COMBIGAN) 0.2-0.5 % ophthalmic solution Place 1 drop into both eyes every 12 (twelve) hours.    . clobetasol ointment (TEMOVATE) 0.05 % APPLY TO AFFECTED AREA TWICE DAILY DO NOT USE MORE THAN 7 DAYS IN A ROW 30 g 0  . ezetimibe-simvastatin (VYTORIN) 10-20 MG tablet Take 1 tablet by mouth daily. 90 tablet 3  . ibuprofen (ADVIL,MOTRIN) 200 MG tablet Take 200 mg by mouth every 8 (eight) hours as needed.    . nadolol (CORGARD) 40 MG tablet Take 1 tablet (40 mg total) by mouth at bedtime. 90 tablet 1  . omeprazole (PRILOSEC) 20 MG capsule Take 20 mg by mouth daily before breakfast.    . sodium chloride (OCEAN) 0.65 % SOLN nasal spray Place 1 spray into both nostrils 2 (two) times daily as needed for congestion.    . traZODone (DESYREL) 100 MG tablet TAKE 1/4 TO 1/2 TABLET BY MOUTH AT BEDTIME IF NEEDED FOR SLEEP 45 tablet 1  . triamcinolone ointment (KENALOG) 0.5 % APPLY EXTERNALLY TO THE AFFECTED AREA TWICE DAILY 30 g 0  . venlafaxine XR (EFFEXOR-XR) 150 MG 24 hr capsule TAKE 1 CAPSULE BY MOUTH TWICE A DAY 180 capsule 1  . vitamin B-12 (CYANOCOBALAMIN) 1000 MCG tablet Take 1,000 mcg by mouth daily.     No current facility-administered medications on file prior to visit.     ALLERGIES: Citrate of magnesia; Codeine; and Shingrix [zoster vac recomb adjuvanted]  Family History  Problem Relation Age of Onset  . Congestive Heart Failure Mother   . Thyroid disease Mother   . Osteoporosis Mother   . Alcoholism Mother   . Prostate cancer Father   . Breast cancer Maternal Grandmother   . Hypertension Sister   . Thyroid disease Sister   . Cancer Sister        brain ?  Marland Kitchen Rheum arthritis Daughter     SH:  Married, non smoker  Review of Systems  Genitourinary: Positive for dysuria and frequency.       Loss of urine with sneeze or cough  Night urination  Vaginal itching   All  other systems reviewed and are negative.   PHYSICAL EXAMINATION:    BP 108/60 (BP Location: Right Arm, Patient Position: Sitting, Cuff Size: Large)   Pulse 68   Resp 16   Ht 5' (1.524 m)   Wt 165 lb (74.8 kg)   LMP 08/11/1974   BMI 32.22 kg/m     General appearance: alert, cooperative and appears stated age Flank: no CVA tenderness Abdomen: soft, non-tender; bowel sounds normal; no masses,  no organomegaly Lymph:  no inguinal LAD noted  Pelvic: External genitalia:  no lesions              Urethra:  normal appearing urethra with no masses, tenderness or lesions  Bartholins and Skenes: normal                 Vagina: normal appearing vagina with normal color and discharge, no lesions, large cystocele noted              Cervix: absent              Bimanual Exam:  Uterus:  uterus absent              Adnexa: no mass, fullness, tenderness  Chaperone was present for exam.  Assessment: Urinary urgency and dysuria  Plan: Bactrim ds bid x 3 days. Urine culture pending.

## 2018-06-21 LAB — URINE CULTURE

## 2018-06-21 NOTE — Addendum Note (Signed)
Addended by: Megan Salon on: 06/21/2018 06:01 PM   Modules accepted: Orders

## 2018-06-22 ENCOUNTER — Telehealth: Payer: Self-pay | Admitting: *Deleted

## 2018-06-22 NOTE — Telephone Encounter (Signed)
Patient returned call to [facility]. °

## 2018-06-22 NOTE — Telephone Encounter (Signed)
LM for pt with results and to call back to schedule TOC. Per pt release form.

## 2018-06-22 NOTE — Telephone Encounter (Signed)
Patient notified.  TOC scheduled 07/05/18

## 2018-06-22 NOTE — Telephone Encounter (Signed)
-----   Message from Megan Salon, MD sent at 06/21/2018  6:01 PM EST ----- Please let pt know her urine culture showed e coli and it was sensitive to bactrim.  Please see if symptoms are resolved.  Also, scheduled TOC urine culture in 2 weeks.  Order placed.

## 2018-06-26 ENCOUNTER — Telehealth: Payer: Self-pay | Admitting: Obstetrics & Gynecology

## 2018-06-26 NOTE — Telephone Encounter (Signed)
Pt called with onset of UTI symptoms this morning.  Had recent UTi that was culture positive and treated with macrobid.  She is having dysuria and urinary urgency.  Rx for bactrim DS BID x 5 days.  Allergies confirmed.  This was sent to pharmacy on file.

## 2018-07-03 ENCOUNTER — Other Ambulatory Visit: Payer: Self-pay | Admitting: Family Medicine

## 2018-07-05 ENCOUNTER — Ambulatory Visit: Payer: Medicare Other

## 2018-07-05 ENCOUNTER — Telehealth: Payer: Self-pay | Admitting: Obstetrics & Gynecology

## 2018-07-05 NOTE — Telephone Encounter (Signed)
Called regarding missed nurse visit. Patient's husband said she forgot and will call back to reschedule.

## 2018-07-05 NOTE — Telephone Encounter (Signed)
From OV 11/10/2017:  A lot of improvement over the weekend- able to walk more, fewer mood swings, no confusion over the weekend- seems to be behind her. Bowel movements more regular now with miralax. . Was told 6 months full recovery. Sees neurosurgeon on Friday. Bruising around incision site much improved . Had ck which was normal, myoglobin slightly high thought due to extensive bruising. Will make sure today trending down. Husband had contacted me last week for xanax to help with her mood swings. She has used in the past for very sparing anxiety- I did not feel comfortable refilling until seeing her in person due to concerns could worsen confusion- her last prescription has expired- they show me a bottle with at least 15 pills in it and last rx was #30.   Please advise

## 2018-07-06 ENCOUNTER — Ambulatory Visit: Payer: Medicare Other

## 2018-07-11 ENCOUNTER — Other Ambulatory Visit: Payer: Self-pay | Admitting: Family Medicine

## 2018-07-12 ENCOUNTER — Ambulatory Visit (INDEPENDENT_AMBULATORY_CARE_PROVIDER_SITE_OTHER): Payer: Medicare Other

## 2018-07-12 VITALS — BP 128/64 | HR 76 | Resp 14 | Ht 60.0 in | Wt 168.6 lb

## 2018-07-12 DIAGNOSIS — N3000 Acute cystitis without hematuria: Secondary | ICD-10-CM

## 2018-07-12 DIAGNOSIS — R300 Dysuria: Secondary | ICD-10-CM

## 2018-07-12 NOTE — Progress Notes (Signed)
Patient here for repeat urin culture. She denies any symptoms except frequent urination but states that is normal for her. She completed the course of bactrim.  Patient was unable to give a specimen. She was sent with urin cups to collect at home.

## 2018-07-13 NOTE — Addendum Note (Signed)
Addended by: Harlan Stains on: 07/13/2018 12:08 PM   Modules accepted: Orders

## 2018-07-15 LAB — URINE CULTURE

## 2018-08-31 ENCOUNTER — Ambulatory Visit (INDEPENDENT_AMBULATORY_CARE_PROVIDER_SITE_OTHER): Payer: Medicare Other | Admitting: Obstetrics & Gynecology

## 2018-08-31 ENCOUNTER — Ambulatory Visit (INDEPENDENT_AMBULATORY_CARE_PROVIDER_SITE_OTHER): Payer: Medicare Other

## 2018-08-31 VITALS — BP 122/68 | HR 72 | Resp 14 | Ht 60.0 in | Wt 166.0 lb

## 2018-08-31 DIAGNOSIS — L292 Pruritus vulvae: Secondary | ICD-10-CM

## 2018-08-31 DIAGNOSIS — R300 Dysuria: Secondary | ICD-10-CM | POA: Diagnosis not present

## 2018-08-31 LAB — POCT URINALYSIS DIPSTICK
BILIRUBIN UA: NEGATIVE
Glucose, UA: NEGATIVE
KETONES UA: NEGATIVE
Nitrite, UA: NEGATIVE
PH UA: 5 (ref 5.0–8.0)
Protein, UA: NEGATIVE
RBC UA: POSITIVE
Urobilinogen, UA: NEGATIVE E.U./dL — AB

## 2018-08-31 MED ORDER — SULFAMETHOXAZOLE-TRIMETHOPRIM 800-160 MG PO TABS: 1.0000 | ORAL_TABLET | Freq: Two times a day (BID) | ORAL | 0 refills | Status: DC

## 2018-08-31 MED ORDER — CLOBETASOL PROPIONATE 0.05 % EX OINT: TOPICAL_OINTMENT | CUTANEOUS | 0 refills | Status: DC

## 2018-08-31 NOTE — Progress Notes (Addendum)
Patient is here for ua and will be seeing Dr. Sabra Heck  She has had intermittent pain for about 3 weeks.

## 2018-08-31 NOTE — Progress Notes (Signed)
GYNECOLOGY  VISIT  CC:   Urinary track infection   HPI: 83 y.o. G4P2 Married White or Caucasian female here for Urinary tract infection symptoms has been present for 3 weeks.  She's having urgency, frequency and dysuria.  She's not having dysuria with every void.  This is typically worse in the morning.  Denies fever or back pain.    Has also been experiencing more vulvar itching.  Has urinary incontinence and chronic moisture present.  Wears pads all of the time.   Denies vaginal bleeding.  GYNECOLOGIC HISTORY: Patient's last menstrual period was 08/11/1974. Contraception: post menapausal Menopausal hormone therapy: none  Patient Active Problem List   Diagnosis Date Noted  . Chest pain 01/24/2018  . Liver lesion 11/10/2017  . Nausea & vomiting 10/31/2017  . Chronic daily headache 07/08/2017  . Memory changes 03/06/2017  . Lumbar stenosis with neurogenic claudication 03/20/2016  . Scalp pain 10/22/2015  . Post-traumatic headache 10/11/2015  . Routine health maintenance 03/06/2013  . Macular degeneration of right eye 03/03/2013  . Glaucoma 03/03/2013  . Hyperlipidemia 03/14/2009  . Depression 10/19/2007  . Essential hypertension 10/19/2007  . GASTROESOPHAGEAL REFLUX DISEASE 10/19/2007  . Osteoarthritis 10/19/2007  . STRESS INCONTINENCE 10/19/2007  . Allergic rhinitis 10/19/2007    Past Medical History:  Diagnosis Date  . Anxiety   . Colon polyps   . Complication of anesthesia    takes longer to wake up- admitted overnight after colonoscopy  . Depression   . GERD (gastroesophageal reflux disease)   . Glaucoma   . Headache    migraines  . Hx: UTI (urinary tract infection)   . Hypercholesteremia   . Hypertension   . Lichen simplex chronicus   . Lumbar stenosis with neurogenic claudication   . Macular degeneration   . Osteoarthritis   . Osteopenia    of the hip  . PONV (postoperative nausea and vomiting)   . Stress incontinence, female   . Unsteady gait   .  Wears glasses   . Whooping cough    as a baby    Past Surgical History:  Procedure Laterality Date  . ABDOMINAL HYSTERECTOMY     ovaries remain  . ANTERIOR LAT LUMBAR FUSION N/A 10/26/2017   Procedure: LUMBAR THREE- LUMBAR FOUR, LUMBAR FOUR- LUMBAR FIVE ANTEROLATERAL LUMBAR INTERBODY ARTHRODESIS;  Surgeon: Jovita Gamma, MD;  Location: [facility];  Service: Neurosurgery;  Laterality: N/A;  LUMBAR 3- LUMBAR 4, LUMBAR 4- LUMBAR 5 ANTEROLATERAL LUMBAR INTERBODY ARTHRODESIS, LUMBAR 3- LUMBAR 4, LUMBAR 4- LUMBAR 5 PERCUTANEOUS PEDICLE SCREW FIXATION  . APPENDECTOMY    . BLADDER REPAIR     x two  . CHOLECYSTECTOMY    . COLONOSCOPY    . EYE SURGERY  11/14, 12/14   Macular Dengeneration, Glaucoma  . EYE SURGERY  2016   left eye  . LESION REMOVAL Right 06/28/2013   Procedure: EXCISION 3 cm right labial sebaceous cyst;  Surgeon: Lyman Speller, MD;  Location: [facility];  Service: Gynecology;  Laterality: Right;  . LUMBAR LAMINECTOMY/DECOMPRESSION MICRODISCECTOMY Right 03/20/2016   Procedure: Right - Lumbar four-five lumbar laminotomy, foraminotomy, and possible microdiscectomy;  Surgeon: Jovita Gamma, MD;  Location: [facility];  Service: Neurosurgery;  Laterality: Right;  right  . LUMBAR PERCUTANEOUS PEDICLE SCREW 2 LEVEL  10/26/2017   Procedure: LUMBAR THREE- LUMBAR FOUR, LUMBAR FOUR- LUMBAR FIVE PERCUTANEOUS PEDICLE SCREW FIXATION;  Surgeon: Jovita Gamma, MD;  Location: [facility];  Service: Neurosurgery;;  . MOHS SURGERY     procedure  to remove basal cell  . TONSILLECTOMY AND ADENOIDECTOMY    . URETHRAL SLING  2007  . URETHRAL SLING  1/12   midurethral   . vertebroplasty secondary to traumatic compression fracture      MEDS:   Current Outpatient Medications on File Prior to Visit  Medication Sig Dispense Refill  . acetaminophen (TYLENOL) 500 MG tablet Take 500 mg by mouth daily as needed for mild pain or headache.     . ALPRAZolam (XANAX) 0.5 MG tablet TAKE 1/2 TO 1 TABLET(0.25 TO  0.5 MG) BY MOUTH TWICE DAILY AS NEEDED FOR ANXIETY 20 tablet 0  . aspirin 81 MG tablet Take 81 mg by mouth daily.    . brimonidine-timolol (COMBIGAN) 0.2-0.5 % ophthalmic solution Place 1 drop into both eyes every 12 (twelve) hours.    . clobetasol ointment (TEMOVATE) 0.05 % APPLY TO AFFECTED AREA TWICE DAILY DO NOT USE MORE THAN 7 DAYS IN A ROW 30 g 0  . ezetimibe-simvastatin (VYTORIN) 10-20 MG tablet Take 1 tablet by mouth daily. 90 tablet 3  . ibuprofen (ADVIL,MOTRIN) 200 MG tablet Take 200 mg by mouth every 8 (eight) hours as needed.    . nadolol (CORGARD) 40 MG tablet TAKE 1 TABLET(40 MG) BY MOUTH AT BEDTIME 90 tablet 2  . nitrofurantoin, macrocrystal-monohydrate, (MACROBID) 100 MG capsule Take 1 capsule (100 mg total) by mouth 2 (two) times daily. 10 capsule 0  . omeprazole (PRILOSEC) 20 MG capsule Take 20 mg by mouth daily before breakfast.    . sodium chloride (OCEAN) 0.65 % SOLN nasal spray Place 1 spray into both nostrils 2 (two) times daily as needed for congestion.    . traZODone (DESYREL) 100 MG tablet TAKE 1/4 TO 1/2 TABLET BY MOUTH AT BEDTIME IF NEEDED FOR SLEEP 45 tablet 1  . triamcinolone ointment (KENALOG) 0.5 % APPLY EXTERNALLY TO THE AFFECTED AREA TWICE DAILY 30 g 0  . venlafaxine XR (EFFEXOR-XR) 150 MG 24 hr capsule TAKE 1 CAPSULE BY MOUTH TWICE A DAY 180 capsule 1  . vitamin B-12 (CYANOCOBALAMIN) 1000 MCG tablet Take 1,000 mcg by mouth daily.     No current facility-administered medications on file prior to visit.     ALLERGIES: Citrate of magnesia; Codeine; and Shingrix [zoster vac recomb adjuvanted]  Family History  Problem Relation Age of Onset  . Congestive Heart Failure Mother   . Thyroid disease Mother   . Osteoporosis Mother   . Alcoholism Mother   . Prostate cancer Father   . Breast cancer Maternal Grandmother   . Hypertension Sister   . Thyroid disease Sister   . Cancer Sister        brain ?  Marland Kitchen Rheum arthritis Daughter     SH:  Married, non  smoker  Review of Systems  Genitourinary: Positive for dysuria.    PHYSICAL EXAMINATION:    LMP 08/11/1974     General appearance: alert, cooperative and appears stated age Abdomen: soft, non-tender; bowel sounds normal; no masses,  no organomegaly Lymph:  no inguinal LAD noted  Pelvic: External genitalia:  No lesions, inner labia majora erythema with mildly thickened appearing whitish tissue on bilateral external labia majora              Urethra:  normal appearing urethra with no masses, tenderness or lesions              Bartholins and Skenes: normal  Vagina: normal appearing vagina with normal color and discharge, no lesions               Chaperone was present for exam.  Assessment: Dysuria Vulvar itching  Plan: Urine culture pending Bactrim DS BID x 5 days Restart clobetasol ointment 0.5% bid x 7-10 days.  #60 gm tube sent to pharmacy.

## 2018-09-01 LAB — URINE CULTURE

## 2018-09-03 ENCOUNTER — Encounter: Payer: Self-pay | Admitting: Obstetrics & Gynecology

## 2018-09-15 ENCOUNTER — Other Ambulatory Visit: Payer: Self-pay | Admitting: Family Medicine

## 2018-10-16 ENCOUNTER — Other Ambulatory Visit: Payer: Self-pay | Admitting: Family Medicine

## 2018-11-02 ENCOUNTER — Ambulatory Visit: Payer: Medicare Other | Admitting: Family Medicine

## 2018-11-04 ENCOUNTER — Telehealth: Payer: Self-pay

## 2018-11-04 NOTE — Telephone Encounter (Signed)
Copied from [place] [id number]. Topic: General - Other >> Nov 03, 2018  5:03 PM Rayann Heman wrote: Reason for CRM: pt called and stated that she would like a print out of most recent OV sent to her home. Pt left this in a voicemail Please advise  Anita, pt wife has been advise of letter being mailed. No further action needed!

## 2018-11-11 ENCOUNTER — Other Ambulatory Visit: Payer: Self-pay | Admitting: Obstetrics & Gynecology

## 2018-11-11 NOTE — Telephone Encounter (Signed)
Medication refill request: Clobetasol Last AEX:  05/14/18 SM Next AEX: 09/13/19 Last MMG (if hormonal medication request): 12/22/17 BIRADS 2 benign/density b Refill authorized: Please advise; order pended #30g w/0 refills if authorized

## 2018-12-23 ENCOUNTER — Telehealth: Payer: Self-pay | Admitting: Obstetrics & Gynecology

## 2018-12-23 NOTE — Telephone Encounter (Signed)
Patient is asking to talk with a nurse about her "problem". She did not give any further details.

## 2018-12-23 NOTE — Telephone Encounter (Signed)
Spoke with patient. Patient states that when she urinates she is experiencing burning on the inside of her labia. States this occurs when the urine touches the skin. Reports labia has been very itchy and she has scratched the skin raw. Tissue bleeds intermittently. Patient has urinary incontinence. Denies any urinary symptoms, fever, chills, back pain, or vaginal discharge. States her labia seems red and inflamed. Asking what she can do for relief.

## 2018-12-23 NOTE — Telephone Encounter (Signed)
Spoke with patient. Advised reviewed with Dr.Miller who recommends using Desitin or boudreaux butt paste. Will need to apply this to affected area as needed. If she is not having symptom relief with cream will need to contact the office. Patient is agreeable.

## 2018-12-29 ENCOUNTER — Telehealth: Payer: Self-pay | Admitting: Family Medicine

## 2018-12-29 NOTE — Telephone Encounter (Signed)
See note

## 2018-12-29 NOTE — Telephone Encounter (Signed)
Copied from [place] [id number]. Topic: Referral - Request for Referral >> Dec 29, 2018  1:28 PM Blase Mess A wrote: Has patient seen PCP for this complaint? Yes  *If NO, is insurance requiring patient see PCP for this issue before PCP can refer them? No Referral for which specialty: Dermatology Preferred provider/office: None Patient is requesting a [facility] doctor Reason for Referral-Breakout below stomach

## 2018-12-29 NOTE — Telephone Encounter (Signed)
Pt hasn't been seen in almost a year. Called and scheduled virtual visit with Dr. Yong Channel tomorrow at 1:40.

## 2018-12-30 ENCOUNTER — Encounter: Payer: Self-pay | Admitting: Family Medicine

## 2018-12-30 ENCOUNTER — Ambulatory Visit (INDEPENDENT_AMBULATORY_CARE_PROVIDER_SITE_OTHER): Payer: Medicare Other | Admitting: Family Medicine

## 2018-12-30 VITALS — BP 140/85 | HR 70 | Temp 96.8°F | Ht 60.0 in | Wt 166.0 lb

## 2018-12-30 DIAGNOSIS — L304 Erythema intertrigo: Secondary | ICD-10-CM | POA: Diagnosis not present

## 2018-12-30 MED ORDER — CLOTRIMAZOLE-BETAMETHASONE 1-0.05 % EX CREA: 1.0000 "application " | TOPICAL_CREAM | Freq: Two times a day (BID) | CUTANEOUS | 0 refills | Status: DC

## 2018-12-30 NOTE — Progress Notes (Signed)
Phone [phone]   Subjective:  Virtual visit via Video note. Chief complaint: Chief Complaint  Patient presents with  . Rash    Pt claims to have a rash under stomach area. Pt noticed rash a week ago. Pt stated that she think it may be related to wearing the diaper. Pt has tried clobetosol that she got from her OBGYN.   This visit type was conducted due to national recommendations for restrictions regarding the COVID-19 Pandemic (e.g. social distancing).  This format is felt to be most appropriate for this patient at this time balancing risks to patient and risks to population by having him in for in person visit.  No physical exam was performed (except for noted visual exam or audio findings with Telehealth visits).    Our team/I connected with [name] at  1:40 PM EDT by a video enabled telemedicine application (doxy.me or caregility through epic) and verified that I am speaking with the correct person using two identifiers.  Location patient: [facility] Location provider: [facility], office Persons participating in the virtual visit:  patient  Our team/I discussed the limitations of evaluation and management by telemedicine and the availability of in person appointments. In light of current covid-19 pandemic, patient also understands that we are trying to protect them by minimizing in office contact if at all possible.  The patient expressed consent for telemedicine visit and agreed to proceed. Patient understands insurance will be billed.   ROS- No fever, chills, cough, shortness of breath, body aches, sore throat, or loss of taste or smell   Past Medical History-  Patient Active Problem List   Diagnosis Date Noted  . Liver lesion 11/10/2017    Priority: High  . Memory changes 03/06/2017    Priority: Medium  . Lumbar stenosis with neurogenic claudication 03/20/2016    Priority: Medium  . Scalp pain 10/22/2015    Priority: Medium  . Macular degeneration of right eye  03/03/2013    Priority: Medium  . Glaucoma 03/03/2013    Priority: Medium  . Hyperlipidemia 03/14/2009    Priority: Medium  . Depression 10/19/2007    Priority: Medium  . Essential hypertension 10/19/2007    Priority: Medium  . GASTROESOPHAGEAL REFLUX DISEASE 10/19/2007    Priority: Medium  . Chest pain 01/24/2018    Priority: Low  . Post-traumatic headache 10/11/2015    Priority: Low  . Routine health maintenance 03/06/2013    Priority: Low  . Osteoarthritis 10/19/2007    Priority: Low  . STRESS INCONTINENCE 10/19/2007    Priority: Low  . Allergic rhinitis 10/19/2007    Priority: Low  . Nausea & vomiting 10/31/2017  . Chronic daily headache 07/08/2017    Medications- reviewed and updated Current Outpatient Medications  Medication Sig Dispense Refill  . acetaminophen (TYLENOL) 500 MG tablet Take 500 mg by mouth daily as needed for mild pain or headache.     . ALPRAZolam (XANAX) 0.5 MG tablet TAKE 1/2 TO 1 TABLET(0.25 TO 0.5 MG) BY MOUTH TWICE DAILY AS NEEDED FOR ANXIETY 20 tablet 0  . aspirin 81 MG tablet Take 81 mg by mouth daily.    . brimonidine-timolol (COMBIGAN) 0.2-0.5 % ophthalmic solution Place 1 drop into both eyes every 12 (twelve) hours.    . clobetasol ointment (TEMOVATE) 0.05 % APPLY A THIN LAYER TO AFFECTED AREA OF SKIN TWICE DAILY FOR 10 DAYS 30 g 0  . ezetimibe-simvastatin (VYTORIN) 10-20 MG tablet TAKE 1 TABLET BY MOUTH ONCE DAILY 90 tablet 3  .  nadolol (CORGARD) 40 MG tablet TAKE 1 TABLET(40 MG) BY MOUTH AT BEDTIME 90 tablet 2  . omeprazole (PRILOSEC) 20 MG capsule Take 20 mg by mouth daily before breakfast.    . sodium chloride (OCEAN) 0.65 % SOLN nasal spray Place 1 spray into both nostrils 2 (two) times daily as needed for congestion.    . traZODone (DESYREL) 100 MG tablet TAKE 1/4 TO 1/2 TABLET BY MOUTH AT BEDTIME IF NEEDED FOR SLEEP 45 tablet 1  . venlafaxine XR (EFFEXOR-XR) 150 MG 24 hr capsule TAKE 1 CAPSULE BY MOUTH TWICE A DAY 180 capsule 1  .  vitamin B-12 (CYANOCOBALAMIN) 1000 MCG tablet Take 1,000 mcg by mouth daily.    . clotrimazole-betamethasone (LOTRISONE) cream Apply 1 application topically 2 (two) times daily. For up to 10 days (fungus and steroid cream) 90 g 0   No current facility-administered medications for this visit.      Objective:  BP 140/85   Pulse 70   Temp (!) 96.8 F (36 C) (Oral)   Ht 5' (1.524 m)   Wt 166 lb (75.3 kg)   LMP 08/11/1974   BMI 32.42 kg/m  self reported vitals Gen: NAD, resting comfortably Lungs: nonlabored, normal respiratory rate  Skin: appears dry, no obvious rash, in her groin - multiple creases with whitish discoloration- then surrounded by erythema- some excoriation    Assessment and Plan   # Rash in groin S: uses an adult diaper at night that she really likes- this seems to rub up against the crease of her lower abdominal pannus. Rash has been present for about a week. Has redness and itches. Clobetasol twice a day gives mild relief of symptoms- seems to be getting slightly better.  A/P: patient with intertrigo with potential fungal element- clobetasol has helped with itching-I believe we need to add an antifungal to push her more quickly in the right direction- will use Lotrisone for ease of use (and due to intense pruritis).   Future Appointments  Date Time Provider [place]  04/04/2019  2:00 PM [facility]  09/13/2019  2:30 PM Megan Salon, MD [place] None   Lab/Order associations: Intertrigo  Meds ordered this encounter  Medications  . clotrimazole-betamethasone (LOTRISONE) cream    Sig: Apply 1 application topically 2 (two) times daily. For up to 10 days (fungus and steroid cream)    Dispense:  90 g    Refill:  0    Return precautions advised.  Garret Reddish, MD

## 2018-12-30 NOTE — Patient Instructions (Addendum)
There are no preventive care reminders to display for this patient.  Depression screen [facility] 2/9 12/30/2018 04/01/2018 02/17/2018  Decreased Interest 0 0 0  Down, Depressed, Hopeless 0 0 0  PHQ - 2 Score 0 0 0  Altered sleeping 0 - 0  Tired, decreased energy 1 - 0  Change in appetite 0 - 0  Feeling bad or failure about yourself  0 - 0  Trouble concentrating 0 - 0  Moving slowly or fidgety/restless 0 - 0  Suicidal thoughts 0 - 0  PHQ-9 Score 1 - 0  Difficult doing work/chores - - Not difficult at all

## 2019-01-12 ENCOUNTER — Other Ambulatory Visit: Payer: Self-pay | Admitting: Family Medicine

## 2019-01-12 NOTE — Telephone Encounter (Signed)
See note, patient was just seen 5/21, not sure a visit is needed for this request  Copied from [place] [phone]. Topic: General - Other >> Jan 12, 2019 10:04 AM Jodie Echevaria wrote: Reason for CRM: Patient called to request a call back from Dr Yong Channel nurse about some medications that she have misplaced. Ph# [phone]

## 2019-01-12 NOTE — Telephone Encounter (Signed)
Please load up Lotrisone request. I will send in trazodone

## 2019-01-12 NOTE — Telephone Encounter (Signed)
Rx request Last fill 02/12/18  #45/1 Last ov 12/30/18  Patient also requesting for some Latrisone Cream.  She has mis placed 2 of the boxes.

## 2019-01-13 NOTE — Telephone Encounter (Signed)
Patient did find Latrisone cream and does not prescription.

## 2019-01-13 NOTE — Telephone Encounter (Signed)
See note

## 2019-01-13 NOTE — Addendum Note (Signed)
Addended by: Gwenyth Ober R on: 01/13/2019 03:56 PM   Modules accepted: Orders

## 2019-01-13 NOTE — Telephone Encounter (Signed)
Rx pended. Sent to PCP for refill.

## 2019-01-25 ENCOUNTER — Other Ambulatory Visit: Payer: Self-pay | Admitting: Family Medicine

## 2019-01-25 NOTE — Telephone Encounter (Signed)
I am okay with a refill if this was effective for her- my hope is that she took at least a 10-day break after using it last time and has not been using it consecutively since last visit-please make sure she has not been using for nearly a month straight at this point-if she has might be worth seeing gynecology or dermatology

## 2019-01-25 NOTE — Telephone Encounter (Signed)
Last OV 12/30/18 Last refill 12/30/18 #90 g/0 Next OV 04/04/19 with [facility]

## 2019-01-26 NOTE — Telephone Encounter (Signed)
Called pt and advised. She had not been taking a 10 day break but she will this time.

## 2019-02-09 ENCOUNTER — Telehealth: Payer: Self-pay

## 2019-02-09 NOTE — Telephone Encounter (Signed)
Pt returned Brandy's vm. Unavailable. Please return call.

## 2019-02-09 NOTE — Telephone Encounter (Signed)
Copied from [place] [phone]. Topic: General - Other >> Feb 09, 2019  9:54 AM Marin Olp L wrote: Reason for CRM: Patient would like a call back from [place] to discuss Oprah winfrey fat pills.

## 2019-02-09 NOTE — Telephone Encounter (Signed)
See note

## 2019-02-09 NOTE — Telephone Encounter (Signed)
Called pt, no answer no VM.

## 2019-02-10 NOTE — Telephone Encounter (Signed)
Called pt, no answer no VM.

## 2019-02-13 ENCOUNTER — Other Ambulatory Visit: Payer: Self-pay | Admitting: Obstetrics and Gynecology

## 2019-02-14 NOTE — Telephone Encounter (Signed)
Medication refill request: Temovate  Last AEX:  05/14/18 Next AEX: 09/13/19 Last MMG (if hormonal medication request):  12/22/17 Bi-rads 2 benign  Refill authorized: 30g

## 2019-02-15 NOTE — Telephone Encounter (Signed)
Called pt again no answer. Message will be closed.

## 2019-03-23 LAB — HM DEXA SCAN

## 2019-03-23 LAB — HM MAMMOGRAPHY

## 2019-03-25 ENCOUNTER — Encounter: Payer: Self-pay | Admitting: Family Medicine

## 2019-03-27 ENCOUNTER — Telehealth: Payer: Self-pay | Admitting: Obstetrics and Gynecology

## 2019-03-27 NOTE — Telephone Encounter (Signed)
This is Dr. Quincy Simmonds reviewing results for Dr. Sabra Heck while she is out of the office.   Please share bone density results with the patient. Her bone density results which showed osteopenia, which is early bone thinning but not osteoporosis.  The risk of fracture is considered low and prescription medication is not indicated.   1200 mg of calcium daily, 600 - 800 IU of vitamin D daily, and weight bearing exercise will help to maintain bone density and strength.  Her next bone density is due in 2 years.

## 2019-03-28 NOTE — Telephone Encounter (Signed)
Call to patient. Message given to patient as seen below from Dr. Quincy Simmonds and she verbalized understanding.   Will close encounter.

## 2019-03-29 ENCOUNTER — Encounter: Payer: Self-pay | Admitting: Family Medicine

## 2019-03-29 ENCOUNTER — Telehealth: Payer: Self-pay | Admitting: Family Medicine

## 2019-03-29 DIAGNOSIS — M85851 Other specified disorders of bone density and structure, right thigh: Secondary | ICD-10-CM | POA: Insufficient documentation

## 2019-03-29 HISTORY — DX: Other specified disorders of bone density and structure, right thigh: M85.851

## 2019-03-29 NOTE — Telephone Encounter (Signed)
You have osteopenia with worst T score -1.3 in right femoral neck- I would recommend taking at least 1000 units of vitamin D each day.  We should repeat test in 2 to 3 years.  Not at osteoporosis level and do not need other medication at this time.

## 2019-03-30 NOTE — Telephone Encounter (Signed)
Called pt no answer. Per Dr.Brook's assistant message. Pt was notified of the results and advise on what to do.

## 2019-04-04 ENCOUNTER — Other Ambulatory Visit: Payer: Self-pay

## 2019-04-04 ENCOUNTER — Ambulatory Visit (INDEPENDENT_AMBULATORY_CARE_PROVIDER_SITE_OTHER): Payer: Medicare Other

## 2019-04-04 DIAGNOSIS — Z0000 Encounter for general adult medical examination without abnormal findings: Secondary | ICD-10-CM | POA: Diagnosis not present

## 2019-04-04 NOTE — Progress Notes (Signed)
I have reviewed and agree with note, evaluation, plan.   Please offer patient an appointment to discuss urinary concerns-if she prefers to schedule an appointment specifically with GYN to discuss that is also reasonable.  Garret Reddish, MD

## 2019-04-04 NOTE — Progress Notes (Signed)
This visit is being conducted via phone call due to the COVID-19 pandemic. This patient has given me verbal consent via phone to conduct this visit, patient states they are participating from their home address. Some vital signs may be absent or patient reported.   Patient identification: identified by name, DOB, and current address.   Subjective:   [name] is a 83 y.o. female who presents for Medicare Annual (Subsequent) preventive examination.  Review of Systems:   Cardiac Risk Factors include: advanced age (>87men, >7 women);hypertension;dyslipidemia     Objective:     Vitals: LMP 08/11/1974   There is no height or weight on file to calculate BMI.  Advanced Directives 04/04/2019 04/01/2018 01/24/2018 01/24/2018 12/07/2017 10/31/2017 10/31/2017  Does Patient Have a Medical Advance Directive? Yes Yes Yes Yes Yes Yes Yes  Type of Advance Directive Living will;Healthcare Power of [place];Living will [place];Living will Living will Living will  Does patient want to make changes to medical advance directive? No - Patient declined - No - Patient declined - - No - Patient declined -  Copy of Reading in Chart? No - copy requested - - - - - No - copy requested  Would patient like information on creating a medical advance directive? - - - - - - No - Patient declined    Tobacco Social History   Tobacco Use  Smoking Status Former Smoker  . Quit date: 08/11/1965  . Years since quitting: 53.6  Smokeless Tobacco Never Used  Tobacco Comment   has not smoked in 45 years       Clinical Intake:  Pre-visit preparation completed: Yes  How often do you need to have someone help you when you read instructions, pamphlets, or other written materials from your doctor or pharmacy?: 1 - Never  Interpreter Needed?: No  Information entered by :: Denman George LPN  Past Medical History:  Diagnosis Date  . Anxiety    . Colon polyps   . Complication of anesthesia    takes longer to wake up- admitted overnight after colonoscopy  . Depression   . GERD (gastroesophageal reflux disease)   . Glaucoma   . Headache    migraines  . Hx: UTI (urinary tract infection)   . Hypercholesteremia   . Hypertension   . Lichen simplex chronicus   . Lumbar stenosis with neurogenic claudication   . Macular degeneration   . Osteoarthritis   . Osteopenia    of the hip  . PONV (postoperative nausea and vomiting)   . Stress incontinence, female   . Unsteady gait   . Wears glasses   . Whooping cough    as a baby   Past Surgical History:  Procedure Laterality Date  . ABDOMINAL HYSTERECTOMY     ovaries remain  . ANTERIOR LAT LUMBAR FUSION N/A 10/26/2017   Procedure: LUMBAR THREE- LUMBAR FOUR, LUMBAR FOUR- LUMBAR FIVE ANTEROLATERAL LUMBAR INTERBODY ARTHRODESIS;  Surgeon: Jovita Gamma, MD;  Location: [facility];  Service: Neurosurgery;  Laterality: N/A;  LUMBAR 3- LUMBAR 4, LUMBAR 4- LUMBAR 5 ANTEROLATERAL LUMBAR INTERBODY ARTHRODESIS, LUMBAR 3- LUMBAR 4, LUMBAR 4- LUMBAR 5 PERCUTANEOUS PEDICLE SCREW FIXATION  . APPENDECTOMY    . BLADDER REPAIR     x two  . CHOLECYSTECTOMY    . COLONOSCOPY    . EYE SURGERY  11/14, 12/14   Macular Dengeneration, Glaucoma  . EYE SURGERY  2016   left  eye  . LESION REMOVAL Right 06/28/2013   Procedure: EXCISION 3 cm right labial sebaceous cyst;  Surgeon: Lyman Speller, MD;  Location: [facility];  Service: Gynecology;  Laterality: Right;  . LUMBAR LAMINECTOMY/DECOMPRESSION MICRODISCECTOMY Right 03/20/2016   Procedure: Right - Lumbar four-five lumbar laminotomy, foraminotomy, and possible microdiscectomy;  Surgeon: Jovita Gamma, MD;  Location: [facility];  Service: Neurosurgery;  Laterality: Right;  right  . LUMBAR PERCUTANEOUS PEDICLE SCREW 2 LEVEL  10/26/2017   Procedure: LUMBAR THREE- LUMBAR FOUR, LUMBAR FOUR- LUMBAR FIVE PERCUTANEOUS PEDICLE SCREW FIXATION;  Surgeon: Jovita Gamma, MD;  Location: [facility];  Service: Neurosurgery;;  . MOHS SURGERY     procedure to remove basal cell  . TONSILLECTOMY AND ADENOIDECTOMY    . URETHRAL SLING  2007  . URETHRAL SLING  1/12   midurethral   . vertebroplasty secondary to traumatic compression fracture     Family History  Problem Relation Age of Onset  . Congestive Heart Failure Mother   . Thyroid disease Mother   . Osteoporosis Mother   . Alcoholism Mother   . Prostate cancer Father   . Breast cancer Maternal Grandmother   . Hypertension Sister   . Thyroid disease Sister   . Cancer Sister        brain ?  Marland Kitchen Rheum arthritis Daughter    Social History   Socioeconomic History  . Marital status: Married    Spouse name: Not on file  . Number of children: 2  . Years of education: [employer]  . Highest education level: Not on file  Occupational History  . Occupation: Retired  Scientific laboratory technician  . Financial resource strain: Not on file  . Food insecurity    Worry: Not on file    Inability: Not on file  . Transportation needs    Medical: Not on file    Non-medical: Not on file  Tobacco Use  . Smoking status: Former Smoker    Quit date: 08/11/1965    Years since quitting: 53.6  . Smokeless tobacco: Never Used  . Tobacco comment: has not smoked in 45 years   Substance and Sexual Activity  . Alcohol use: Yes    Alcohol/week: 1.0 standard drinks    Types: 1 Standard drinks or equivalent per week    Comment: occ   . Drug use: No  . Sexual activity: Not Currently    Partners: Male    Birth control/protection: Surgical    Comment: TAH  Lifestyle  . Physical activity    Days per week: Not on file    Minutes per session: Not on file  . Stress: Not on file  Relationships  . Social Herbalist on phone: Not on file    Gets together: Not on file    Attends religious service: Not on file    Active member of club or organization: Not on file    Attends meetings of clubs or organizations: Not on file     Relationship status: Not on file  Other Topics Concern  . Not on file  Social History Narrative   HSG, attended [employer]   Married '65   1 son '67, 1 Daughter '69; 5 grandchildren. 1 grandson dealing with drugs after divorce of parents. Oldest.    Work: retired Pharmacist, hospital   Marriage in good health   Former smoker   Right-handed   Caffeine: 11/2 cups per day    Outpatient Encounter Medications as of 04/04/2019  Medication Sig  . acetaminophen (TYLENOL) 500 MG tablet Take 500 mg by mouth daily as needed for mild pain or headache.   . ALPRAZolam (XANAX) 0.5 MG tablet TAKE 1/2 TO 1 TABLET(0.25 TO 0.5 MG) BY MOUTH TWICE DAILY AS NEEDED FOR ANXIETY  . aspirin 81 MG tablet Take 81 mg by mouth daily.  . brimonidine-timolol (COMBIGAN) 0.2-0.5 % ophthalmic solution Place 1 drop into both eyes every 12 (twelve) hours.  . clobetasol ointment (TEMOVATE) 0.05 % APPLY A THIN LAYER TO AFFECTED AREA OF THE SKIN TWICE DAILY FOR 10 DAYS  . clotrimazole-betamethasone (LOTRISONE) cream APPLY 1 APPLICATION TOPICALLY TWICE DAILY FOR UPTO 10 DAYS  . ezetimibe-simvastatin (VYTORIN) 10-20 MG tablet TAKE 1 TABLET BY MOUTH ONCE DAILY  . nadolol (CORGARD) 40 MG tablet TAKE 1 TABLET(40 MG) BY MOUTH AT BEDTIME  . omeprazole (PRILOSEC) 20 MG capsule Take 20 mg by mouth daily before breakfast.  . sodium chloride (OCEAN) 0.65 % SOLN nasal spray Place 1 spray into both nostrils 2 (two) times daily as needed for congestion.  . traZODone (DESYREL) 100 MG tablet TAKE 1/4 TO 1/2 TABLET BY MOUTH AT BEDTIME IF NEEDED FOR SLEEP  . venlafaxine XR (EFFEXOR-XR) 150 MG 24 hr capsule TAKE 1 CAPSULE BY MOUTH TWICE A DAY  . vitamin B-12 (CYANOCOBALAMIN) 1000 MCG tablet Take 1,000 mcg by mouth daily.   No facility-administered encounter medications on file as of 04/04/2019.     Activities of Daily Living In your present state of health, do you have any difficulty performing the following activities: 04/04/2019  Hearing? N   Vision? N  Difficulty concentrating or making decisions? N  Walking or climbing stairs? Y  Dressing or bathing? N  Doing errands, shopping? N  Preparing Food and eating ? N  Using the Toilet? N  In the past six months, have you accidently leaked urine? N  Do you have problems with loss of bowel control? N  Managing your Medications? N  Managing your Finances? N  Housekeeping or managing your Housekeeping? N  Some recent data might be hidden    Patient Care Team: Marin Olp, MD as PCP - General (Family Medicine) Sherren Mocha, MD as PCP - Cardiology (Cardiology)    Assessment:   This is a routine wellness examination for [place].  Exercise Activities and Dietary recommendations Current Exercise Habits: The patient does not participate in regular exercise at present  Goals    . Patient Stated     To be able to walk again. Eat well; eat fruit and vegetables and get enough nutrition to heal     . weight     Please get a good variety of colorful food   Watches her portions; eating less   Fat free or low fat dairy products Fish high in omega-3 acids ( salmon, tuna, trout) Fruits, such as apples, bananas, oranges, pears, prunes Legumes, such as kidney beans, lentils, checkpeas, black-eyed peas and lima beans Vegetables; broccoli, cabbage, carrots Whole grains;   Plant fats are better; decrease "white" foods as pasta, rice, bread and desserts, sugar; Avoid red meat (limiting) palm and coconut oils; sugary foods and beverages  Two nutrients that raise blood chol levels are saturated fats and trans fat; in hydrogenated oils and fats, as stick margarine, baked goods (cookes, cakes, pies, crackers; frosting; and coffee creamers;   Some Fats lower cholesterol: Monounsaturated and polyunsaturated  Avocados Corn, sunflower, and soybean oils Nuts and seeds, such as walnuts Olive, canola, peanut, safflower, and  sesame oils Peanut butter Salmon and trout Tofu           Fall Risk Fall Risk  04/04/2019 04/01/2018 03/25/2017 03/06/2017 04/04/2016  Falls in the past year? 0 No Yes Yes Yes  Number falls in past yr: 0 - 2 or more 2 or more 2 or more  Injury with Fall? 0 - - No Yes  Follow up Education provided - Education provided - -    Depression Screen PHQ 2/9 Scores 04/04/2019 12/30/2018 04/01/2018 02/17/2018  PHQ - 2 Score 0 0 0 0  PHQ- 9 Score 0 1 - 0     Cognitive Function MMSE - Mini Mental State Exam 04/01/2018 03/25/2017  Not completed: (No Data) -  Orientation to time - 5  Orientation to Place - 5  Registration - 3  Attention/ Calculation - 5  Recall - 3  Language- name 2 objects - 2  Language- follow 3 step command - 3  Language- read & follow direction - 1  Write a sentence - 1  Copy design - 1     6CIT Screen 04/04/2019  What Year? 0 points  What month? 0 points  What time? 0 points  Count back from 20 0 points  Months in reverse 0 points  Repeat phrase 0 points  Total Score 0    Immunization History  Administered Date(s) Administered  . Influenza Whole 07/18/2004, 04/25/2010, 04/28/2010  . Influenza, High Dose Seasonal PF 08/26/2013, 04/19/2014, 04/04/2016  . Influenza-Unspecified 05/11/2012, 04/03/2015, 04/13/2017  . Pneumococcal Conjugate-13 10/08/2015  . Pneumococcal Polysaccharide-23 08/11/2006  . Td 03/09/2008  . Tdap 04/03/2015  . Zoster 08/11/2009  . Zoster Recombinat (Shingrix) 12/17/2016    Qualifies for Shingles Vaccine? Discussed and patient will check with pharmacy for coverage.  Patient education handout provided    Screening Tests Health Maintenance  Topic Date Due  . INFLUENZA VACCINE  03/12/2019  . TETANUS/TDAP  04/02/2025  . DEXA SCAN  Completed  . PNA vac Low Risk Adult  Completed    Cancer Screenings: Breast:  Up to date on Mammogram? Yes; last 03/23/19   Up to date of Bone Density/Dexa? Yes; last 03/23/19 Colorectal: up to date; last with Dr. Earlean Shawl       Plan:    I have personally reviewed and  addressed the Medicare Annual Wellness questionnaire and have noted the following in the patient's chart:  A. Medical and social history B. Use of alcohol, tobacco or illicit drugs  C. Current medications and supplements D. Functional ability and status E.  Nutritional status F.  Physical activity G. Advance directives H. List of other physicians I.  Hospitalizations, surgeries, and ER visits in previous 12 months J.  [place] such as hearing and vision if needed, cognitive and depression L. Referrals, records requested, and appointments- none   In addition, I have reviewed and discussed with patient certain preventive protocols, quality metrics, and best practice recommendations. A written personalized care plan for preventive services as well as general preventive health recommendations were provided to patient.   Signed,  Denman George, LPN  Nurse Health Advisor   Nurse Notes:  Patient sees gynecology but has concerns with chronic urinary incontinence and would like to discuss medication options.  She also would like to make you are that she has started to take the Keto Fast supplement.

## 2019-04-04 NOTE — Patient Instructions (Signed)
[name] , Thank you for taking time to come for your Medicare Wellness Visit. I appreciate your ongoing commitment to your health goals. Please review the following plan we discussed and let me know if I can assist you in the future.   Screening recommendations/referrals: Colorectal Screening: up to date; last with Dr. Earlean Shawl  Mammogram: completed 03/23/19 Bone Density: completed 03/23/19  Vision and Dental Exams: Recommended annual ophthalmology exams for early detection of glaucoma and other disorders of the eye Recommended annual dental exams for proper oral hygiene  Vaccinations: Influenza vaccine:  recommended this fall either at PCP office or through your local pharmacy  Pneumococcal vaccine: completed; 10/08/15 Tdap vaccine: up to date; last 04/03/15 Shingles vaccine: Please call your insurance company to determine your out of pocket expense for the Shingrix vaccine. You may receive this vaccine at your local pharmacy.  Advanced directives: Please bring a copy of your POA (Power of Attorney) and/or Living Will to your next appointment.  Goals: Recommend to decrease portion sizes by eating 3 small healthy meals and at least 2 healthy snacks per day.    Next appointment: Please schedule your Annual Wellness Visit with your Nurse Health Advisor in one year.  Preventive Care 17 Years and Older, Female Preventive care refers to lifestyle choices and visits with your health care provider that can promote health and wellness. What does preventive care include?  A yearly physical exam. This is also called an annual well check.  Dental exams once or twice a year.  Routine eye exams. Ask your health care provider how often you should have your eyes checked.  Personal lifestyle choices, including:  Daily care of your teeth and gums.  Regular physical activity.  Eating a healthy diet.  Avoiding tobacco and drug use.  Limiting alcohol use.  Practicing safe sex.  Taking  low-dose aspirin every day if recommended by your health care provider.  Taking vitamin and mineral supplements as recommended by your health care provider. What happens during an annual well check? The services and screenings done by your health care provider during your annual well check will depend on your age, overall health, lifestyle risk factors, and family history of disease. Counseling  Your health care provider may ask you questions about your:  Alcohol use.  Tobacco use.  Drug use.  Emotional well-being.  Home and relationship well-being.  Sexual activity.  Eating habits.  History of falls.  Memory and ability to understand (cognition).  Work and work Statistician.  Reproductive health. Screening  You may have the following tests or measurements:  Height, weight, and BMI.  Blood pressure.  Lipid and cholesterol levels. These may be checked every 5 years, or more frequently if you are over 38 years old.  Skin check.  Lung cancer screening. You may have this screening every year starting at age 91 if you have a 30-pack-year history of smoking and currently smoke or have quit within the past 15 years.  Fecal occult blood test (FOBT) of the stool. You may have this test every year starting at age 22.  Flexible sigmoidoscopy or colonoscopy. You may have a sigmoidoscopy every 5 years or a colonoscopy every 10 years starting at age 6.  Hepatitis C blood test.  Hepatitis B blood test.  Sexually transmitted disease (STD) testing.  Diabetes screening. This is done by checking your blood sugar (glucose) after you have not eaten for a while (fasting). You may have this done every 1-3 years.  Bone density  scan. This is done to screen for osteoporosis. You may have this done starting at age 57.  Mammogram. This may be done every 1-2 years. Talk to your health care provider about how often you should have regular mammograms. Talk with your health care provider  about your test results, treatment options, and if necessary, the need for more tests. Vaccines  Your health care provider may recommend certain vaccines, such as:  Influenza vaccine. This is recommended every year.  Tetanus, diphtheria, and acellular pertussis (Tdap, Td) vaccine. You may need a Td booster every 10 years.  Zoster vaccine. You may need this after age 50.  Pneumococcal 13-valent conjugate (PCV13) vaccine. One dose is recommended after age 87.  Pneumococcal polysaccharide (PPSV23) vaccine. One dose is recommended after age 49. Talk to your health care provider about which screenings and vaccines you need and how often you need them. This information is not intended to replace advice given to you by your health care provider. Make sure you discuss any questions you have with your health care provider. Document Released: 08/24/2015 Document Revised: 04/16/2016 Document Reviewed: 05/29/2015 Elsevier Interactive Patient Education  2017 [place] Prevention in the Home Falls can cause injuries. They can happen to people of all ages. There are many things you can do to make your home safe and to help prevent falls. What can I do on the outside of my home?  Regularly fix the edges of walkways and driveways and fix any cracks.  Remove anything that might make you trip as you walk through a door, such as a raised step or threshold.  Trim any bushes or trees on the path to your home.  Use bright outdoor lighting.  Clear any walking paths of anything that might make someone trip, such as rocks or tools.  Regularly check to see if handrails are loose or broken. Make sure that both sides of any steps have handrails.  Any raised decks and porches should have guardrails on the edges.  Have any leaves, snow, or ice cleared regularly.  Use sand or salt on walking paths during winter.  Clean up any spills in your garage right away. This includes oil or grease spills.  What can I do in the bathroom?  Use night lights.  Install grab bars by the toilet and in the tub and shower. Do not use towel bars as grab bars.  Use non-skid mats or decals in the tub or shower.  If you need to sit down in the shower, use a plastic, non-slip stool.  Keep the floor dry. Clean up any water that spills on the floor as soon as it happens.  Remove soap buildup in the tub or shower regularly.  Attach bath mats securely with double-sided non-slip rug tape.  Do not have throw rugs and other things on the floor that can make you trip. What can I do in the bedroom?  Use night lights.  Make sure that you have a light by your bed that is easy to reach.  Do not use any sheets or blankets that are too big for your bed. They should not hang down onto the floor.  Have a firm chair that has side arms. You can use this for support while you get dressed.  Do not have throw rugs and other things on the floor that can make you trip. What can I do in the kitchen?  Clean up any spills right away.  Avoid walking on wet  floors.  Keep items that you use a lot in easy-to-reach places.  If you need to reach something above you, use a strong step stool that has a grab bar.  Keep electrical cords out of the way.  Do not use floor polish or wax that makes floors slippery. If you must use wax, use non-skid floor wax.  Do not have throw rugs and other things on the floor that can make you trip. What can I do with my stairs?  Do not leave any items on the stairs.  Make sure that there are handrails on both sides of the stairs and use them. Fix handrails that are broken or loose. Make sure that handrails are as long as the stairways.  Check any carpeting to make sure that it is firmly attached to the stairs. Fix any carpet that is loose or worn.  Avoid having throw rugs at the top or bottom of the stairs. If you do have throw rugs, attach them to the floor with carpet tape.   Make sure that you have a light switch at the top of the stairs and the bottom of the stairs. If you do not have them, ask someone to add them for you. What else can I do to help prevent falls?  Wear shoes that:  Do not have high heels.  Have rubber bottoms.  Are comfortable and fit you well.  Are closed at the toe. Do not wear sandals.  If you use a stepladder:  Make sure that it is fully opened. Do not climb a closed stepladder.  Make sure that both sides of the stepladder are locked into place.  Ask someone to hold it for you, if possible.  Clearly mark and make sure that you can see:  Any grab bars or handrails.  First and last steps.  Where the edge of each step is.  Use tools that help you move around (mobility aids) if they are needed. These include:  Canes.  Walkers.  Scooters.  Crutches.  Turn on the lights when you go into a dark area. Replace any light bulbs as soon as they burn out.  Set up your furniture so you have a clear path. Avoid moving your furniture around.  If any of your floors are uneven, fix them.  If there are any pets around you, be aware of where they are.  Review your medicines with your doctor. Some medicines can make you feel dizzy. This can increase your chance of falling. Ask your doctor what other things that you can do to help prevent falls. This information is not intended to replace advice given to you by your health care provider. Make sure you discuss any questions you have with your health care provider. Document Released: 05/24/2009 Document Revised: 01/03/2016 Document Reviewed: 09/01/2014 Elsevier Interactive Patient Education  2017 [employer].

## 2019-04-06 ENCOUNTER — Encounter: Payer: Self-pay | Admitting: Family Medicine

## 2019-04-12 ENCOUNTER — Other Ambulatory Visit: Payer: Self-pay | Admitting: Family Medicine

## 2019-04-20 ENCOUNTER — Other Ambulatory Visit: Payer: Self-pay | Admitting: Family Medicine

## 2019-04-20 ENCOUNTER — Telehealth: Payer: Self-pay | Admitting: Physical Therapy

## 2019-04-20 MED ORDER — OMEPRAZOLE 40 MG PO CPDR: 40.0000 mg | DELAYED_RELEASE_CAPSULE | Freq: Every day | ORAL | 1 refills | Status: DC

## 2019-04-20 NOTE — Telephone Encounter (Signed)
Called and spoke with patient, she reports classic reflux sx. She denies exertional chest pain, shortness of breath, left arm or neck pain, abnormal sweating.  Rx sent to pharmacy.

## 2019-04-20 NOTE — Telephone Encounter (Signed)
Please make sure patient is not having other symptoms like exertional chest pain, shortness of breath, left arm or neck pain, abnormal sweating with her symptoms.  If this is classic for her reflux-okay to send in omeprazole 40 mg #30 with 1 refills and recommend follow-up in 4 to 8 weeks or sooner if not improving within 2 weeks

## 2019-04-20 NOTE — Telephone Encounter (Signed)
Called and spoke with patient. She c/o of acid reflux with mid-line chest discomfort off and on over the past 2 months. She went to the drug store to get Prilosec but they were out so she had to get the generic. Sx started to flare up after starting on the generic product. She feels fluid coming up into her throat from the reflux and she has been belching a lot more. She tried switching back to the name brand but still has not been able to get any relief. She would like to know if there is another medication or brand that Dr. Yong Channel and recommend.

## 2019-04-20 NOTE — Telephone Encounter (Signed)
Copied from [place] [phone] [id number]. Topic: General - Call Back - No Documentation >> Apr 20, 2019  2:37 PM Erick Blinks wrote: Reason for CRM: Pt called reporting pains, declined to describe where exactly. Pt prefers to disclose with PCP only. Requesting call back from [place] or nurse to discuss if urgent care is a good alternative before she is able to see PCP. Please advise  Best contact: [phone]

## 2019-04-20 NOTE — Addendum Note (Signed)
Addended by: Jasper Loser on: 04/20/2019 04:26 PM   Modules accepted: Orders

## 2019-04-22 ENCOUNTER — Other Ambulatory Visit: Payer: Self-pay | Admitting: Obstetrics & Gynecology

## 2019-04-22 NOTE — Telephone Encounter (Signed)
Medication refill request: Clobetasol Last AEX:  05/14/18 SM Next AEX: 09/13/19 Last MMG (if hormonal medication request): 03/23/19 BIRADS 2 benign/density a Refill authorized: Please advise on refill

## 2019-04-25 NOTE — Progress Notes (Signed)
Mychart message sent to patient.

## 2019-05-16 ENCOUNTER — Encounter: Payer: Self-pay | Admitting: Physician Assistant

## 2019-05-16 ENCOUNTER — Other Ambulatory Visit: Payer: Self-pay

## 2019-05-16 ENCOUNTER — Ambulatory Visit (INDEPENDENT_AMBULATORY_CARE_PROVIDER_SITE_OTHER): Payer: Medicare Other | Admitting: Physician Assistant

## 2019-05-16 DIAGNOSIS — N39498 Other specified urinary incontinence: Secondary | ICD-10-CM

## 2019-05-16 DIAGNOSIS — L304 Erythema intertrigo: Secondary | ICD-10-CM | POA: Diagnosis not present

## 2019-05-16 MED ORDER — NYSTATIN 100000 UNIT/GM EX OINT: TOPICAL_OINTMENT | CUTANEOUS | 0 refills | Status: DC

## 2019-05-16 NOTE — Patient Instructions (Signed)
It was great to see you!  Try to keep area dry as possible, and change any padding that is moist as frequently as possible.  I do think we should have you check in with urology to see if they can give [place] some further advice to help your symptoms.  Let me know if I need to put a new referral for this.  Whenever you are dry and changing your padding, please use either Desitin, A&D, or Boudreau's Butt cream to affected area to provide a barrier.  Then on the rash, you may apply the nystatin ointment.  Please keep [place] posted on your progress.  Sorry you are dealing with this!  Take care,  Inda Coke PA-C

## 2019-05-16 NOTE — Progress Notes (Signed)
TELEPHONE ENCOUNTER   Patient verbally agreed to telephone visit and is aware that copayment and coinsurance may apply. Patient was treated using telemedicine according to accepted telemedicine protocols.  Location of the patient: home Location of provider: office, [place] of all persons participating in the telemedicine service and role in the encounter: Inda Coke, PA, [name] (patient), Anselmo Pickler LPN  Subjective:   Chief Complaint  Patient presents with  . Rash     HPI   Rash Pt c/o rash in perineal area and thighs, x several months. She has tried Clobetasol ointment (given by ob-gyn) months ago but ran out of medication. She saw her PCP virtually on 12/30/18 and was suspected to have intertrigo with fungal component, was put on lotrisone. She states that this did not help her symptoms.  She wears a pad all day and an adult diaper at night. She is having worsening incontinence; s/p two unsuccessful baldder tacks, hasn't seen urology in 2+ years.  She has tried use of A&D and Desitin without relief of symptoms.  Denies: fever, chills, open areas, pain   Patient Active Problem List   Diagnosis Date Noted  . Osteopenia of neck of right femur 03/29/2019  . Chest pain 01/24/2018  . Liver lesion 11/10/2017  . Nausea & vomiting 10/31/2017  . Chronic daily headache 07/08/2017  . Memory changes 03/06/2017  . Lumbar stenosis with neurogenic claudication 03/20/2016  . Scalp pain 10/22/2015  . Post-traumatic headache 10/11/2015  . Routine health maintenance 03/06/2013  . Macular degeneration of right eye 03/03/2013  . Glaucoma 03/03/2013  . Hyperlipidemia 03/14/2009  . Depression 10/19/2007  . Essential hypertension 10/19/2007  . GASTROESOPHAGEAL REFLUX DISEASE 10/19/2007  . Osteoarthritis 10/19/2007  . STRESS INCONTINENCE 10/19/2007  . Allergic rhinitis 10/19/2007   Social History   Tobacco Use  . Smoking status: Former Smoker   Quit date: 08/11/1965    Years since quitting: 53.7  . Smokeless tobacco: Never Used  . Tobacco comment: has not smoked in 45 years   Substance Use Topics  . Alcohol use: Yes    Alcohol/week: 1.0 standard drinks    Types: 1 Standard drinks or equivalent per week    Comment: occ     Current Outpatient Medications:  .  acetaminophen (TYLENOL) 500 MG tablet, Take 500 mg by mouth daily as needed for mild pain or headache. , Disp: , Rfl:  .  ALPRAZolam (XANAX) 0.5 MG tablet, TAKE 1/2 TO 1 TABLET(0.25 TO 0.5 MG) BY MOUTH TWICE DAILY AS NEEDED FOR ANXIETY, Disp: 20 tablet, Rfl: 0 .  aspirin 81 MG tablet, Take 81 mg by mouth daily., Disp: , Rfl:  .  brimonidine-timolol (COMBIGAN) 0.2-0.5 % ophthalmic solution, Place 1 drop into both eyes every 12 (twelve) hours., Disp: , Rfl:  .  clotrimazole-betamethasone (LOTRISONE) cream, APPLY 1 APPLICATION TOPICALLY TWICE DAILY FOR UPTO 10 DAYS, Disp: 60 g, Rfl: 0 .  ezetimibe-simvastatin (VYTORIN) 10-20 MG tablet, TAKE 1 TABLET BY MOUTH ONCE DAILY, Disp: 90 tablet, Rfl: 3 .  MISC NATURAL PRODUCTS PO, Take by mouth. KETO FAST, Disp: , Rfl:  .  nadolol (CORGARD) 40 MG tablet, TAKE 1 TABLET(40 MG) BY MOUTH AT BEDTIME, Disp: 90 tablet, Rfl: 2 .  omeprazole (PRILOSEC) 40 MG capsule, TAKE 1 CAPSULE(40 MG) BY MOUTH DAILY, Disp: 90 capsule, Rfl: 0 .  sodium chloride (OCEAN) 0.65 % SOLN nasal spray, Place 1 spray into both nostrils 2 (two) times daily as needed for congestion., Disp: ,  Rfl:  .  traZODone (DESYREL) 100 MG tablet, TAKE 1/4 TO 1/2 TABLET BY MOUTH AT BEDTIME IF NEEDED FOR SLEEP, Disp: 45 tablet, Rfl: 1 .  venlafaxine XR (EFFEXOR-XR) 150 MG 24 hr capsule, TAKE 1 CAPSULE BY MOUTH TWICE DAILY, Disp: 180 capsule, Rfl: 1 .  vitamin B-12 (CYANOCOBALAMIN) 1000 MCG tablet, Take 1,000 mcg by mouth daily., Disp: , Rfl:  .  clobetasol ointment (TEMOVATE) 0.05 %, APPLY A THIN LAYER TO THE AFFECTED AREA OF THE SKIN TWICE DAILY FOR 10 DAYS (Patient not taking: Reported on  05/16/2019), Disp: 30 g, Rfl: 0 .  nystatin ointment (MYCOSTATIN), Apply to affected area 1-2 times daily, Disp: 30 g, Rfl: 0 Allergies  Allergen Reactions  . Citrate Of Magnesia Other (See Comments)    confusion  . Codeine Nausea And Vomiting  . Shingrix [Zoster Vac Recomb Adjuvanted] Swelling and Rash    Rash and temp x several days. (low grade 100 -101 )     Assessment & Plan:   1. Intertrigo  Uncontrolled. Discussed need for frequent changes of pads/diapers as soon as they become moist. Also, good hygiene and recurrent application of good barrier ointment like Boudreaux's butt cream, desitin, etc. Will trial nystatin ointment. Worsening precautions advised. Ideally, needs to work on getting her incontinence under control to prevent this -- she is agreeable to referral back to [facility] (has been possibly >3 years?)  2. STRESS INCONTINENCE  Uncontrolled. See above.    Orders Placed This Encounter  Procedures  . Ambulatory referral to Urology   Meds ordered this encounter  Medications  . nystatin ointment (MYCOSTATIN)    Sig: Apply to affected area 1-2 times daily    Dispense:  30 g    Refill:  0    Order Specific Question:   Supervising Provider    Answer:   Juleen China, ERICA [place], PA 05/16/2019  Time spent with the patient: 12 minutes, spent in obtaining information about her symptoms, reviewing her previous labs, evaluations, and treatments, counseling her about her condition (please see the discussed topics above), and developing a plan to further investigate it; she had a number of questions which I addressed.

## 2019-05-17 ENCOUNTER — Telehealth: Payer: Self-pay | Admitting: Obstetrics & Gynecology

## 2019-05-17 NOTE — Telephone Encounter (Signed)
Call to patient. Patient states she has noticed some bleeding when she pulls apart her labia. States she knows it is not coming from the urine. Notices the bleeding with wiping. None on sanitary pad. Was evaluated virtually by PA yesterday by PCP office. Was given a prescription for nystatin. Patient used last night and this am and has not noticed a difference. States, "I'm itching like I'm going to go crazy." Denies urinary symptoms or fever. States she is having some burning with urination, but thinks that is from scratching the area. Patient requesting OV for Dr. Sabra Heck to look at the area and evaluate. OV scheduled for 05-18-2019 at 1130 with Dr. Sabra Heck. Patient agreeable to date and time of appointment.   Routing to provider and will close encounter.

## 2019-05-17 NOTE — Telephone Encounter (Signed)
Patient is having some irregular bleeding. She is also complaining of a rash.

## 2019-05-18 ENCOUNTER — Other Ambulatory Visit: Payer: Self-pay

## 2019-05-18 ENCOUNTER — Ambulatory Visit: Payer: Medicare Other | Admitting: Obstetrics & Gynecology

## 2019-05-18 VITALS — BP 134/64 | HR 66 | Temp 98.5°F | Resp 14 | Ht 60.0 in | Wt 163.2 lb

## 2019-05-18 DIAGNOSIS — L292 Pruritus vulvae: Secondary | ICD-10-CM | POA: Diagnosis not present

## 2019-05-18 DIAGNOSIS — N8111 Cystocele, midline: Secondary | ICD-10-CM | POA: Diagnosis not present

## 2019-05-18 MED ORDER — TERCONAZOLE 0.4 % VA CREA: 1.0000 | TOPICAL_CREAM | Freq: Every day | VAGINAL | 0 refills | Status: DC

## 2019-05-18 NOTE — Progress Notes (Signed)
GYNECOLOGY  VISIT  CC:   Vaginal itcing   HPI: 83 y.o. G4P2 Married White or Caucasian female here for Patient is having some vaginal itching and is having some bleeding when she wipes, but she thinks she wipes to hard because of the itching.  Has not been using topical steroid.  Has urinary incontinence and is almost always wet.  Wears pads all of the time.  Thinks this doesn't help.  GYNECOLOGIC HISTORY: Patient's last menstrual period was 08/11/1974. Contraception: post menopausal  Menopausal hormone therapy: no  Patient Active Problem List   Diagnosis Date Noted  . Osteopenia of neck of right femur 03/29/2019  . Chest pain 01/24/2018  . Liver lesion 11/10/2017  . Nausea & vomiting 10/31/2017  . Chronic daily headache 07/08/2017  . Memory changes 03/06/2017  . Lumbar stenosis with neurogenic claudication 03/20/2016  . Scalp pain 10/22/2015  . Post-traumatic headache 10/11/2015  . Routine health maintenance 03/06/2013  . Macular degeneration of right eye 03/03/2013  . Glaucoma 03/03/2013  . Hyperlipidemia 03/14/2009  . Depression 10/19/2007  . Essential hypertension 10/19/2007  . GASTROESOPHAGEAL REFLUX DISEASE 10/19/2007  . Osteoarthritis 10/19/2007  . STRESS INCONTINENCE 10/19/2007  . Allergic rhinitis 10/19/2007    Past Medical History:  Diagnosis Date  . Anxiety   . Colon polyps   . Complication of anesthesia    takes longer to wake up- admitted overnight after colonoscopy  . Depression   . GERD (gastroesophageal reflux disease)   . Glaucoma   . Headache    migraines  . Hx: UTI (urinary tract infection)   . Hypercholesteremia   . Hypertension   . Lichen simplex chronicus   . Lumbar stenosis with neurogenic claudication   . Macular degeneration   . Osteoarthritis   . Osteopenia    of the hip  . PONV (postoperative nausea and vomiting)   . Stress incontinence, female   . Unsteady gait   . Wears glasses   . Whooping cough    as a baby    Past Surgical  History:  Procedure Laterality Date  . ABDOMINAL HYSTERECTOMY     ovaries remain  . ANTERIOR LAT LUMBAR FUSION N/A 10/26/2017   Procedure: LUMBAR THREE- LUMBAR FOUR, LUMBAR FOUR- LUMBAR FIVE ANTEROLATERAL LUMBAR INTERBODY ARTHRODESIS;  Surgeon: Jovita Gamma, MD;  Location: [place] [facility];  Service: Neurosurgery;  Laterality: N/A;  LUMBAR 3- LUMBAR 4, LUMBAR 4- LUMBAR 5 ANTEROLATERAL LUMBAR INTERBODY ARTHRODESIS, LUMBAR 3- LUMBAR 4, LUMBAR 4- LUMBAR 5 PERCUTANEOUS PEDICLE SCREW FIXATION  . APPENDECTOMY    . BLADDER REPAIR     x two  . CHOLECYSTECTOMY    . COLONOSCOPY    . EYE SURGERY  11/14, 12/14   Macular Dengeneration, Glaucoma  . EYE SURGERY  2016   left eye  . LESION REMOVAL Right 06/28/2013   Procedure: EXCISION 3 cm right labial sebaceous cyst;  Surgeon: Lyman Speller, MD;  Location: [facility];  Service: Gynecology;  Laterality: Right;  . LUMBAR LAMINECTOMY/DECOMPRESSION MICRODISCECTOMY Right 03/20/2016   Procedure: Right - Lumbar four-five lumbar laminotomy, foraminotomy, and possible microdiscectomy;  Surgeon: Jovita Gamma, MD;  Location: [facility];  Service: Neurosurgery;  Laterality: Right;  right  . LUMBAR PERCUTANEOUS PEDICLE SCREW 2 LEVEL  10/26/2017   Procedure: LUMBAR THREE- LUMBAR FOUR, LUMBAR FOUR- LUMBAR FIVE PERCUTANEOUS PEDICLE SCREW FIXATION;  Surgeon: Jovita Gamma, MD;  Location: [place];  Service: Neurosurgery;;  . MOHS SURGERY     procedure to remove basal cell  . TONSILLECTOMY  AND ADENOIDECTOMY    . URETHRAL SLING  2007  . URETHRAL SLING  1/12   midurethral   . vertebroplasty secondary to traumatic compression fracture      MEDS:   Current Outpatient Medications on File Prior to Visit  Medication Sig Dispense Refill  . acetaminophen (TYLENOL) 500 MG tablet Take 500 mg by mouth daily as needed for mild pain or headache.     . ALPRAZolam (XANAX) 0.5 MG tablet TAKE 1/2 TO 1 TABLET(0.25 TO 0.5 MG) BY MOUTH TWICE DAILY AS NEEDED FOR ANXIETY 20 tablet 0  .  aspirin 81 MG tablet Take 81 mg by mouth daily.    . brimonidine-timolol (COMBIGAN) 0.2-0.5 % ophthalmic solution Place 1 drop into both eyes every 12 (twelve) hours.    . clobetasol ointment (TEMOVATE) 0.05 % APPLY A THIN LAYER TO THE AFFECTED AREA OF THE SKIN TWICE DAILY FOR 10 DAYS (Patient not taking: Reported on 05/16/2019) 30 g 0  . clotrimazole-betamethasone (LOTRISONE) cream APPLY 1 APPLICATION TOPICALLY TWICE DAILY FOR UPTO 10 DAYS 60 g 0  . ezetimibe-simvastatin (VYTORIN) 10-20 MG tablet TAKE 1 TABLET BY MOUTH ONCE DAILY 90 tablet 3  . MISC NATURAL PRODUCTS PO Take by mouth. KETO FAST    . nadolol (CORGARD) 40 MG tablet TAKE 1 TABLET(40 MG) BY MOUTH AT BEDTIME 90 tablet 2  . nystatin ointment (MYCOSTATIN) Apply to affected area 1-2 times daily 30 g 0  . omeprazole (PRILOSEC) 40 MG capsule TAKE 1 CAPSULE(40 MG) BY MOUTH DAILY 90 capsule 0  . sodium chloride (OCEAN) 0.65 % SOLN nasal spray Place 1 spray into both nostrils 2 (two) times daily as needed for congestion.    . traZODone (DESYREL) 100 MG tablet TAKE 1/4 TO 1/2 TABLET BY MOUTH AT BEDTIME IF NEEDED FOR SLEEP 45 tablet 1  . venlafaxine XR (EFFEXOR-XR) 150 MG 24 hr capsule TAKE 1 CAPSULE BY MOUTH TWICE DAILY 180 capsule 1  . vitamin B-12 (CYANOCOBALAMIN) 1000 MCG tablet Take 1,000 mcg by mouth daily.     No current facility-administered medications on file prior to visit.     ALLERGIES: Citrate of magnesia, Codeine, and Shingrix [zoster vac recomb adjuvanted]  Family History  Problem Relation Age of Onset  . Congestive Heart Failure Mother   . Thyroid disease Mother   . Osteoporosis Mother   . Alcoholism Mother   . Prostate cancer Father   . Breast cancer Maternal Grandmother   . Hypertension Sister   . Thyroid disease Sister   . Cancer Sister        brain ?  Marland Kitchen Rheum arthritis Daughter     SH:  Married, non smoker  Review of Systems  All other systems reviewed and are negative.   PHYSICAL EXAMINATION:    BP  134/64   Pulse 66   Temp 98.5 F (36.9 C)   Resp 14   Ht 5' (1.524 m)   Wt 163 lb 3.2 oz (74 kg)   LMP 08/11/1974   BMI 31.87 kg/m     General appearance: alert, cooperative and appears stated age Lymph:  no inguinal LAD noted  Pelvic: External genitalia:  Significant erythema of external labia majora with skin breakdown              Urethra:  normal appearing urethra with no masses, tenderness or lesions              Bartholins and Skenes: normal  Vagina: normal appearing vagina with normal color and discharge, no lesions              Cervix: absent              Bimanual Exam:  Uterus:  uterus absent              Adnexa: normal adnexa             Chaperone was present for exam.  Assessment: Cystocele Urinary incontinence  Vulvar itching/irritation  Plan: Affirm pending Terazol 7 to pharmacy May need to start steroid ointment back again but will wait for affirm testing

## 2019-05-19 ENCOUNTER — Encounter: Payer: Self-pay | Admitting: Obstetrics & Gynecology

## 2019-05-19 LAB — VAGINITIS/VAGINOSIS, DNA PROBE
Candida Species: NEGATIVE
Gardnerella vaginalis: POSITIVE — AB
Trichomonas vaginosis: NEGATIVE

## 2019-05-19 MED ORDER — CLOBETASOL PROPIONATE 0.05 % EX OINT: TOPICAL_OINTMENT | CUTANEOUS | 0 refills | Status: DC

## 2019-05-19 MED ORDER — METRONIDAZOLE 0.75 % VA GEL: 1.0000 | Freq: Every day | VAGINAL | 0 refills | Status: DC

## 2019-05-24 ENCOUNTER — Other Ambulatory Visit: Payer: Self-pay

## 2019-05-26 ENCOUNTER — Ambulatory Visit: Payer: Medicare Other | Admitting: Obstetrics & Gynecology

## 2019-05-26 ENCOUNTER — Other Ambulatory Visit: Payer: Self-pay

## 2019-05-26 ENCOUNTER — Encounter: Payer: Self-pay | Admitting: Obstetrics & Gynecology

## 2019-05-26 VITALS — BP 142/70 | HR 68 | Temp 96.5°F | Ht 60.0 in | Wt 162.0 lb

## 2019-05-26 DIAGNOSIS — Z23 Encounter for immunization: Secondary | ICD-10-CM | POA: Diagnosis not present

## 2019-05-26 DIAGNOSIS — L292 Pruritus vulvae: Secondary | ICD-10-CM

## 2019-05-26 NOTE — Progress Notes (Signed)
GYNECOLOGY  VISIT  CC:   Follow up BV  HPI: 83 y.o. G4P2 Married White or Caucasian female here for follow up of vulvar itching.  BV was noted with affirm testing.  She was treated with Metrogel vaginally.  She used it for five days.  She is using topical steroid externally as well.  Reports itching is much better however it is still present.  Denies vaginal bleeding.    GYNECOLOGIC HISTORY: Patient's last menstrual period was 08/11/1974. Contraception: PMP Menopausal hormone therapy: none  Patient Active Problem List   Diagnosis Date Noted  . Osteopenia of neck of right femur 03/29/2019  . Chest pain 01/24/2018  . Liver lesion 11/10/2017  . Nausea & vomiting 10/31/2017  . Chronic daily headache 07/08/2017  . Memory changes 03/06/2017  . Lumbar stenosis with neurogenic claudication 03/20/2016  . Scalp pain 10/22/2015  . Post-traumatic headache 10/11/2015  . Routine health maintenance 03/06/2013  . Macular degeneration of right eye 03/03/2013  . Glaucoma 03/03/2013  . Hyperlipidemia 03/14/2009  . Depression 10/19/2007  . Essential hypertension 10/19/2007  . GASTROESOPHAGEAL REFLUX DISEASE 10/19/2007  . Osteoarthritis 10/19/2007  . STRESS INCONTINENCE 10/19/2007  . Allergic rhinitis 10/19/2007    Past Medical History:  Diagnosis Date  . Anxiety   . Colon polyps   . Complication of anesthesia    takes longer to wake up- admitted overnight after colonoscopy  . Depression   . GERD (gastroesophageal reflux disease)   . Glaucoma   . Headache    migraines  . Hx: UTI (urinary tract infection)   . Hypercholesteremia   . Hypertension   . Lichen simplex chronicus   . Lumbar stenosis with neurogenic claudication   . Macular degeneration   . Osteoarthritis   . Osteopenia    of the hip  . PONV (postoperative nausea and vomiting)   . Stress incontinence, female   . Unsteady gait   . Wears glasses   . Whooping cough    as a baby    Past Surgical History:  Procedure  Laterality Date  . ABDOMINAL HYSTERECTOMY     ovaries remain  . ANTERIOR LAT LUMBAR FUSION N/A 10/26/2017   Procedure: LUMBAR THREE- LUMBAR FOUR, LUMBAR FOUR- LUMBAR FIVE ANTEROLATERAL LUMBAR INTERBODY ARTHRODESIS;  Surgeon: Jovita Gamma, MD;  Location: [place];  Service: Neurosurgery;  Laterality: N/A;  LUMBAR 3- LUMBAR 4, LUMBAR 4- LUMBAR 5 ANTEROLATERAL LUMBAR INTERBODY ARTHRODESIS, LUMBAR 3- LUMBAR 4, LUMBAR 4- LUMBAR 5 PERCUTANEOUS PEDICLE SCREW FIXATION  . APPENDECTOMY    . BLADDER REPAIR     x two  . CHOLECYSTECTOMY    . COLONOSCOPY    . EYE SURGERY  11/14, 12/14   Macular Dengeneration, Glaucoma  . EYE SURGERY  2016   left eye  . LESION REMOVAL Right 06/28/2013   Procedure: EXCISION 3 cm right labial sebaceous cyst;  Surgeon: Lyman Speller, MD;  Location: [facility];  Service: Gynecology;  Laterality: Right;  . LUMBAR LAMINECTOMY/DECOMPRESSION MICRODISCECTOMY Right 03/20/2016   Procedure: Right - Lumbar four-five lumbar laminotomy, foraminotomy, and possible microdiscectomy;  Surgeon: Jovita Gamma, MD;  Location: [facility];  Service: Neurosurgery;  Laterality: Right;  right  . LUMBAR PERCUTANEOUS PEDICLE SCREW 2 LEVEL  10/26/2017   Procedure: LUMBAR THREE- LUMBAR FOUR, LUMBAR FOUR- LUMBAR FIVE PERCUTANEOUS PEDICLE SCREW FIXATION;  Surgeon: Jovita Gamma, MD;  Location: [place];  Service: Neurosurgery;;  . MOHS SURGERY     procedure to remove basal cell  . TONSILLECTOMY AND ADENOIDECTOMY    .  URETHRAL SLING  2007  . URETHRAL SLING  1/12   midurethral   . vertebroplasty secondary to traumatic compression fracture      MEDS:   Current Outpatient Medications on File Prior to Visit  Medication Sig Dispense Refill  . acetaminophen (TYLENOL) 500 MG tablet Take 500 mg by mouth daily as needed for mild pain or headache.     . ALPRAZolam (XANAX) 0.5 MG tablet TAKE 1/2 TO 1 TABLET(0.25 TO 0.5 MG) BY MOUTH TWICE DAILY AS NEEDED FOR ANXIETY 20 tablet 0  . aspirin 81 MG tablet  Take 81 mg by mouth daily.    . brimonidine-timolol (COMBIGAN) 0.2-0.5 % ophthalmic solution Place 1 drop into both eyes every 12 (twelve) hours.    . clobetasol ointment (TEMOVATE) 0.05 % Apply layer topically BID 30 g 0  . clotrimazole-betamethasone (LOTRISONE) cream APPLY 1 APPLICATION TOPICALLY TWICE DAILY FOR UPTO 10 DAYS 60 g 0  . ezetimibe-simvastatin (VYTORIN) 10-20 MG tablet TAKE 1 TABLET BY MOUTH ONCE DAILY 90 tablet 3  . MISC NATURAL PRODUCTS PO Take by mouth. KETO FAST    . nadolol (CORGARD) 40 MG tablet TAKE 1 TABLET(40 MG) BY MOUTH AT BEDTIME 90 tablet 2  . nystatin ointment (MYCOSTATIN) Apply to affected area 1-2 times daily 30 g 0  . omeprazole (PRILOSEC) 40 MG capsule TAKE 1 CAPSULE(40 MG) BY MOUTH DAILY 90 capsule 0  . sodium chloride (OCEAN) 0.65 % SOLN nasal spray Place 1 spray into both nostrils 2 (two) times daily as needed for congestion.    . traZODone (DESYREL) 100 MG tablet TAKE 1/4 TO 1/2 TABLET BY MOUTH AT BEDTIME IF NEEDED FOR SLEEP 45 tablet 1  . venlafaxine XR (EFFEXOR-XR) 150 MG 24 hr capsule TAKE 1 CAPSULE BY MOUTH TWICE DAILY 180 capsule 1  . vitamin B-12 (CYANOCOBALAMIN) 1000 MCG tablet Take 1,000 mcg by mouth daily.     No current facility-administered medications on file prior to visit.     ALLERGIES: Citrate of magnesia, Codeine, and Shingrix [zoster vac recomb adjuvanted]  Family History  Problem Relation Age of Onset  . Congestive Heart Failure Mother   . Thyroid disease Mother   . Osteoporosis Mother   . Alcoholism Mother   . Prostate cancer Father   . Breast cancer Maternal Grandmother   . Hypertension Sister   . Thyroid disease Sister   . Cancer Sister        brain ?  Marland Kitchen Rheum arthritis Daughter     SH:  Married, non smoker  Review of Systems  Genitourinary:       Vaginal itching   Skin: Positive for rash.  All other systems reviewed and are negative.   PHYSICAL EXAMINATION:    BP (!) 142/70   Pulse 68   Temp (!) 96.5 F (35.8  C) (Temporal)   Ht 5' (1.524 m)   Wt 162 lb (73.5 kg)   LMP 08/11/1974   BMI 31.64 kg/m     Physical Exam  Constitutional: She is oriented to person, place, and time. She appears well-developed and well-nourished.  Genitourinary:    Vagina normal.      Lymphadenopathy:       Right: No inguinal adenopathy present.       Left: No inguinal adenopathy present.  Neurological: She is alert and oriented to person, place, and time.  Skin: Skin is warm and dry.  Psychiatric: She has a normal mood and affect.    Chaperone was present for exam.  Assessment: Vulvar itching c/w LS&A BV  Plan: Continue steroid ointment BID and follow up in one month.  Chronic nature of this skin change discussed and need to stay on top of itching instead of letting it get as bad as it was one week ago.  Strategies discussed. Consider biopsy at follow-up if not significantly improved High dose flu vaccination given today.   ~15 minutes spent with patient >50% of time was in face to face discussion of above.

## 2019-05-27 ENCOUNTER — Other Ambulatory Visit: Payer: Self-pay | Admitting: Family Medicine

## 2019-06-14 ENCOUNTER — Other Ambulatory Visit: Payer: Self-pay

## 2019-06-16 ENCOUNTER — Encounter: Payer: Self-pay | Admitting: Obstetrics & Gynecology

## 2019-06-16 ENCOUNTER — Ambulatory Visit: Payer: Medicare Other | Admitting: Obstetrics & Gynecology

## 2019-06-16 ENCOUNTER — Other Ambulatory Visit: Payer: Self-pay

## 2019-06-16 VITALS — BP 132/70 | HR 72 | Temp 97.2°F | Resp 12 | Ht 60.0 in | Wt 160.4 lb

## 2019-06-16 DIAGNOSIS — L292 Pruritus vulvae: Secondary | ICD-10-CM | POA: Diagnosis not present

## 2019-06-16 NOTE — Progress Notes (Signed)
GYNECOLOGY  VISIT  CC:   Patient states that the itching is much better  HPI: 83 y.o. G4P2 Married White or Caucasian female here for f/u for itching.  Has been using the clobetasol twice daily and she reports symptoms are much, much better.  Denies any bleeding.  Itching is much improved.  GYNECOLOGIC HISTORY: Patient's last menstrual period was 08/11/1974. Contraception:Hysterectomy Menopausal hormone therapy: none  Patient Active Problem List   Diagnosis Date Noted  . Osteopenia of neck of right femur 03/29/2019  . Chest pain 01/24/2018  . Liver lesion 11/10/2017  . Nausea & vomiting 10/31/2017  . Chronic daily headache 07/08/2017  . Memory changes 03/06/2017  . Lumbar stenosis with neurogenic claudication 03/20/2016  . Scalp pain 10/22/2015  . Post-traumatic headache 10/11/2015  . Routine health maintenance 03/06/2013  . Macular degeneration of right eye 03/03/2013  . Glaucoma 03/03/2013  . Hyperlipidemia 03/14/2009  . Depression 10/19/2007  . Essential hypertension 10/19/2007  . GASTROESOPHAGEAL REFLUX DISEASE 10/19/2007  . Osteoarthritis 10/19/2007  . STRESS INCONTINENCE 10/19/2007  . Allergic rhinitis 10/19/2007    Past Medical History:  Diagnosis Date  . Anxiety   . Colon polyps   . Complication of anesthesia    takes longer to wake up- admitted overnight after colonoscopy  . Depression   . GERD (gastroesophageal reflux disease)   . Glaucoma   . Headache    migraines  . Hx: UTI (urinary tract infection)   . Hypercholesteremia   . Hypertension   . Lichen simplex chronicus   . Lumbar stenosis with neurogenic claudication   . Macular degeneration   . Osteoarthritis   . Osteopenia    of the hip  . PONV (postoperative nausea and vomiting)   . Stress incontinence, female   . Unsteady gait   . Wears glasses   . Whooping cough    as a baby    Past Surgical History:  Procedure Laterality Date  . ABDOMINAL HYSTERECTOMY     ovaries remain  . ANTERIOR  LAT LUMBAR FUSION N/A 10/26/2017   Procedure: LUMBAR THREE- LUMBAR FOUR, LUMBAR FOUR- LUMBAR FIVE ANTEROLATERAL LUMBAR INTERBODY ARTHRODESIS;  Surgeon: Jovita Gamma, MD;  Location: [facility];  Service: Neurosurgery;  Laterality: N/A;  LUMBAR 3- LUMBAR 4, LUMBAR 4- LUMBAR 5 ANTEROLATERAL LUMBAR INTERBODY ARTHRODESIS, LUMBAR 3- LUMBAR 4, LUMBAR 4- LUMBAR 5 PERCUTANEOUS PEDICLE SCREW FIXATION  . APPENDECTOMY    . BLADDER REPAIR     x two  . CHOLECYSTECTOMY    . COLONOSCOPY    . EYE SURGERY  11/14, 12/14   Macular Dengeneration, Glaucoma  . EYE SURGERY  2016   left eye  . LESION REMOVAL Right 06/28/2013   Procedure: EXCISION 3 cm right labial sebaceous cyst;  Surgeon: Lyman Speller, MD;  Location: [facility];  Service: Gynecology;  Laterality: Right;  . LUMBAR LAMINECTOMY/DECOMPRESSION MICRODISCECTOMY Right 03/20/2016   Procedure: Right - Lumbar four-five lumbar laminotomy, foraminotomy, and possible microdiscectomy;  Surgeon: Jovita Gamma, MD;  Location: [facility];  Service: Neurosurgery;  Laterality: Right;  right  . LUMBAR PERCUTANEOUS PEDICLE SCREW 2 LEVEL  10/26/2017   Procedure: LUMBAR THREE- LUMBAR FOUR, LUMBAR FOUR- LUMBAR FIVE PERCUTANEOUS PEDICLE SCREW FIXATION;  Surgeon: Jovita Gamma, MD;  Location: [facility];  Service: Neurosurgery;;  . MOHS SURGERY     procedure to remove basal cell  . TONSILLECTOMY AND ADENOIDECTOMY    . URETHRAL SLING  2007  . URETHRAL SLING  1/12   midurethral   . vertebroplasty  secondary to traumatic compression fracture      MEDS:   Current Outpatient Medications on File Prior to Visit  Medication Sig Dispense Refill  . acetaminophen (TYLENOL) 500 MG tablet Take 500 mg by mouth daily as needed for mild pain or headache.     . ALPRAZolam (XANAX) 0.5 MG tablet TAKE 1/2 TO 1 TABLET(0.25 TO 0.5 MG) BY MOUTH TWICE DAILY AS NEEDED FOR ANXIETY 20 tablet 0  . aspirin 81 MG tablet Take 81 mg by mouth daily.    . brimonidine-timolol (COMBIGAN) 0.2-0.5 %  ophthalmic solution Place 1 drop into both eyes every 12 (twelve) hours.    . clobetasol ointment (TEMOVATE) 0.05 % Apply layer topically BID 30 g 0  . clotrimazole-betamethasone (LOTRISONE) cream APPLY 1 APPLICATION TOPICALLY TWICE DAILY FOR UPTO 10 DAYS 60 g 0  . ezetimibe-simvastatin (VYTORIN) 10-20 MG tablet TAKE 1 TABLET BY MOUTH ONCE DAILY 90 tablet 3  . nadolol (CORGARD) 40 MG tablet TAKE 1 TABLET(40 MG) BY MOUTH AT BEDTIME 90 tablet 2  . nystatin ointment (MYCOSTATIN) Apply to affected area 1-2 times daily 30 g 0  . omeprazole (PRILOSEC) 40 MG capsule TAKE 1 CAPSULE(40 MG) BY MOUTH DAILY 90 capsule 0  . sodium chloride (OCEAN) 0.65 % SOLN nasal spray Place 1 spray into both nostrils 2 (two) times daily as needed for congestion.    . traZODone (DESYREL) 100 MG tablet TAKE 1/4 TO 1/2 TABLET BY MOUTH AT BEDTIME IF NEEDED FOR SLEEP 45 tablet 1  . venlafaxine XR (EFFEXOR-XR) 150 MG 24 hr capsule TAKE 1 CAPSULE BY MOUTH TWICE DAILY 180 capsule 1  . vitamin B-12 (CYANOCOBALAMIN) 1000 MCG tablet Take 1,000 mcg by mouth daily.     No current facility-administered medications on file prior to visit.     ALLERGIES: Citrate of magnesia, Codeine, and Shingrix [zoster vac recomb adjuvanted]  Family History  Problem Relation Age of Onset  . Congestive Heart Failure Mother   . Thyroid disease Mother   . Osteoporosis Mother   . Alcoholism Mother   . Prostate cancer Father   . Breast cancer Maternal Grandmother   . Hypertension Sister   . Thyroid disease Sister   . Cancer Sister        brain ?  Marland Kitchen Rheum arthritis Daughter     SH:  Married, non smoker  Review of Systems  Constitutional: Negative.   HENT: Negative.   Eyes: Negative.   Respiratory: Negative.   Cardiovascular: Negative.   Gastrointestinal: Negative.   Endocrine: Negative.   Genitourinary: Negative.   Musculoskeletal: Negative.   Skin: Negative.   Allergic/Immunologic: Negative.   Neurological: Negative.    Hematological: Negative.   Psychiatric/Behavioral: Negative.     PHYSICAL EXAMINATION:    BP 132/70 (BP Location: Left Arm, Patient Position: Sitting, Cuff Size: Normal)   Pulse 72   Temp (!) 97.2 F (36.2 C) (Temporal)   Resp 12   Ht 5' (1.524 m)   Wt 160 lb 6.4 oz (72.8 kg)   LMP 08/11/1974   BMI 31.33 kg/m     General appearance: alert, cooperative and appears stated age Lymph:  no inguinal LAD noted  Pelvic: External genitalia:  Erosive changes are all resolved, no thickened white tissue present              Urethra:  normal appearing urethra with no masses, tenderness or lesions              Bartholins and Skenes: normal  Vagina: normal appearing vagina with normal color and discharge, no lesions              Chaperone was present for exam.  Assessment: Vulvar itching with erosive skin changes that have completely resolved with a little less than 3 weeks of clobetasol use  Plan: Topical steroid taper discussed with pt.  Instructions provided.  Pt will follow up in February for already scheduled appt.

## 2019-06-16 NOTE — Patient Instructions (Signed)
Continue clobetasol 0.05% twice daily for two more weeks.  Then decrease to using a night only for another 4 weeks.  Then decrease to using two nights weekly for another 4 weeks.

## 2019-07-04 ENCOUNTER — Telehealth: Payer: Self-pay

## 2019-07-04 NOTE — Telephone Encounter (Signed)
Patient has been notified of BMD results as written by provider.

## 2019-07-15 ENCOUNTER — Other Ambulatory Visit: Payer: Self-pay

## 2019-07-15 MED ORDER — VENLAFAXINE HCL ER 150 MG PO CP24: 150.0000 mg | ORAL_CAPSULE | Freq: Two times a day (BID) | ORAL | 1 refills | Status: DC

## 2019-09-01 ENCOUNTER — Ambulatory Visit (INDEPENDENT_AMBULATORY_CARE_PROVIDER_SITE_OTHER): Payer: Medicare HMO | Admitting: Obstetrics & Gynecology

## 2019-09-01 ENCOUNTER — Encounter: Payer: Self-pay | Admitting: Obstetrics & Gynecology

## 2019-09-01 ENCOUNTER — Telehealth: Payer: Self-pay | Admitting: Obstetrics & Gynecology

## 2019-09-01 ENCOUNTER — Other Ambulatory Visit: Payer: Self-pay

## 2019-09-01 VITALS — BP 138/72 | HR 60 | Temp 96.6°F | Resp 10 | Ht 60.0 in | Wt 161.0 lb

## 2019-09-01 DIAGNOSIS — N3090 Cystitis, unspecified without hematuria: Secondary | ICD-10-CM | POA: Diagnosis not present

## 2019-09-01 DIAGNOSIS — N898 Other specified noninflammatory disorders of vagina: Secondary | ICD-10-CM | POA: Diagnosis not present

## 2019-09-01 DIAGNOSIS — R3129 Other microscopic hematuria: Secondary | ICD-10-CM

## 2019-09-01 DIAGNOSIS — R300 Dysuria: Secondary | ICD-10-CM | POA: Diagnosis not present

## 2019-09-01 LAB — POCT URINALYSIS DIPSTICK
Bilirubin, UA: POSITIVE
Glucose, UA: NEGATIVE
Ketones, UA: NEGATIVE
Nitrite, UA: POSITIVE
Odor: POSITIVE
Protein, UA: POSITIVE — AB
Urobilinogen, UA: 1 E.U./dL
pH, UA: 6 (ref 5.0–8.0)

## 2019-09-01 MED ORDER — CLOBETASOL PROPIONATE 0.05 % EX OINT: TOPICAL_OINTMENT | CUTANEOUS | 0 refills | Status: DC

## 2019-09-01 MED ORDER — NYSTATIN 100000 UNIT/GM EX OINT: TOPICAL_OINTMENT | CUTANEOUS | 0 refills | Status: DC

## 2019-09-01 MED ORDER — NITROFURANTOIN MONOHYD MACRO 100 MG PO CAPS: 100.0000 mg | ORAL_CAPSULE | Freq: Two times a day (BID) | ORAL | 0 refills | Status: DC

## 2019-09-01 NOTE — Progress Notes (Signed)
GYNECOLOGY  VISIT  CC:   Patient complains of having burning with urination  HPI: 84 y.o. G4P2 Married White or Caucasian female here for UTI.  Reports she had increased urine frequency and then started having some pain at the end of urination last night.  Denies fever or new back pain.  She is also noting some increased vaginal odor that has been present for about a week.  Denies blood in her urine.    She is using her topical steroid much more regularly and the itching has totally resolved.  Feels much better.    Cannot void here today.  GYNECOLOGIC HISTORY: Patient's last menstrual period was 08/11/1974. Contraception: Hysterectomy Menopausal hormone therapy: none  Patient Active Problem List   Diagnosis Date Noted  . Osteopenia of neck of right femur 03/29/2019  . Chest pain 01/24/2018  . Liver lesion 11/10/2017  . Nausea & vomiting 10/31/2017  . Chronic daily headache 07/08/2017  . Memory changes 03/06/2017  . Lumbar stenosis with neurogenic claudication 03/20/2016  . Scalp pain 10/22/2015  . Post-traumatic headache 10/11/2015  . Routine health maintenance 03/06/2013  . Macular degeneration of right eye 03/03/2013  . Glaucoma 03/03/2013  . Hyperlipidemia 03/14/2009  . Depression 10/19/2007  . Essential hypertension 10/19/2007  . GASTROESOPHAGEAL REFLUX DISEASE 10/19/2007  . Osteoarthritis 10/19/2007  . STRESS INCONTINENCE 10/19/2007  . Allergic rhinitis 10/19/2007    Past Medical History:  Diagnosis Date  . Anxiety   . Colon polyps   . Complication of anesthesia    takes longer to wake up- admitted overnight after colonoscopy  . Depression   . GERD (gastroesophageal reflux disease)   . Glaucoma   . Headache    migraines  . Hx: UTI (urinary tract infection)   . Hypercholesteremia   . Hypertension   . Lichen simplex chronicus   . Lumbar stenosis with neurogenic claudication   . Macular degeneration   . Osteoarthritis   . Osteopenia    of the hip  . PONV  (postoperative nausea and vomiting)   . Stress incontinence, female   . Unsteady gait   . Wears glasses   . Whooping cough    as a baby    Past Surgical History:  Procedure Laterality Date  . ABDOMINAL HYSTERECTOMY     ovaries remain  . ANTERIOR LAT LUMBAR FUSION N/A 10/26/2017   Procedure: LUMBAR THREE- LUMBAR FOUR, LUMBAR FOUR- LUMBAR FIVE ANTEROLATERAL LUMBAR INTERBODY ARTHRODESIS;  Surgeon: Jovita Gamma, MD;  Location: [facility];  Service: Neurosurgery;  Laterality: N/A;  LUMBAR 3- LUMBAR 4, LUMBAR 4- LUMBAR 5 ANTEROLATERAL LUMBAR INTERBODY ARTHRODESIS, LUMBAR 3- LUMBAR 4, LUMBAR 4- LUMBAR 5 PERCUTANEOUS PEDICLE SCREW FIXATION  . APPENDECTOMY    . BLADDER REPAIR     x two  . CHOLECYSTECTOMY    . COLONOSCOPY    . EYE SURGERY  11/14, 12/14   Macular Dengeneration, Glaucoma  . EYE SURGERY  2016   left eye  . LESION REMOVAL Right 06/28/2013   Procedure: EXCISION 3 cm right labial sebaceous cyst;  Surgeon: Lyman Speller, MD;  Location: [facility];  Service: Gynecology;  Laterality: Right;  . LUMBAR LAMINECTOMY/DECOMPRESSION MICRODISCECTOMY Right 03/20/2016   Procedure: Right - Lumbar four-five lumbar laminotomy, foraminotomy, and possible microdiscectomy;  Surgeon: Jovita Gamma, MD;  Location: [facility];  Service: Neurosurgery;  Laterality: Right;  right  . LUMBAR PERCUTANEOUS PEDICLE SCREW 2 LEVEL  10/26/2017   Procedure: LUMBAR THREE- LUMBAR FOUR, LUMBAR FOUR- LUMBAR FIVE PERCUTANEOUS PEDICLE SCREW  FIXATION;  Surgeon: Jovita Gamma, MD;  Location: [facility];  Service: Neurosurgery;;  . MOHS SURGERY     procedure to remove basal cell  . TONSILLECTOMY AND ADENOIDECTOMY    . URETHRAL SLING  2007  . URETHRAL SLING  1/12   midurethral   . vertebroplasty secondary to traumatic compression fracture      MEDS:   Current Outpatient Medications on File Prior to Visit  Medication Sig Dispense Refill  . acetaminophen (TYLENOL) 500 MG tablet Take 500 mg by mouth daily as needed for  mild pain or headache.     . ALPRAZolam (XANAX) 0.5 MG tablet TAKE 1/2 TO 1 TABLET(0.25 TO 0.5 MG) BY MOUTH TWICE DAILY AS NEEDED FOR ANXIETY 20 tablet 0  . aspirin 81 MG tablet Take 81 mg by mouth daily.    . brimonidine-timolol (COMBIGAN) 0.2-0.5 % ophthalmic solution Place 1 drop into both eyes every 12 (twelve) hours.    . clobetasol ointment (TEMOVATE) 0.05 % Apply layer topically BID 30 g 0  . clotrimazole-betamethasone (LOTRISONE) cream APPLY 1 APPLICATION TOPICALLY TWICE DAILY FOR UPTO 10 DAYS 60 g 0  . ezetimibe-simvastatin (VYTORIN) 10-20 MG tablet TAKE 1 TABLET BY MOUTH ONCE DAILY 90 tablet 3  . nadolol (CORGARD) 40 MG tablet TAKE 1 TABLET(40 MG) BY MOUTH AT BEDTIME 90 tablet 2  . nystatin ointment (MYCOSTATIN) Apply to affected area 1-2 times daily 30 g 0  . omeprazole (PRILOSEC) 40 MG capsule TAKE 1 CAPSULE(40 MG) BY MOUTH DAILY 90 capsule 0  . sodium chloride (OCEAN) 0.65 % SOLN nasal spray Place 1 spray into both nostrils 2 (two) times daily as needed for congestion.    . traZODone (DESYREL) 100 MG tablet TAKE 1/4 TO 1/2 TABLET BY MOUTH AT BEDTIME IF NEEDED FOR SLEEP 45 tablet 1  . venlafaxine XR (EFFEXOR-XR) 150 MG 24 hr capsule Take 1 capsule (150 mg total) by mouth 2 (two) times daily. 180 capsule 1  . vitamin B-12 (CYANOCOBALAMIN) 1000 MCG tablet Take 1,000 mcg by mouth daily.     No current facility-administered medications on file prior to visit.    ALLERGIES: Citrate of magnesia, Codeine, and Shingrix [zoster vac recomb adjuvanted]  Family History  Problem Relation Age of Onset  . Congestive Heart Failure Mother   . Thyroid disease Mother   . Osteoporosis Mother   . Alcoholism Mother   . Prostate cancer Father   . Breast cancer Maternal Grandmother   . Hypertension Sister   . Thyroid disease Sister   . Cancer Sister        brain ?  Marland Kitchen Rheum arthritis Daughter     SH:  Married, non smoker  Review of Systems  Genitourinary: Positive for difficulty urinating,  frequency and urgency.       Burning with urination  All other systems reviewed and are negative.   PHYSICAL EXAMINATION:    BP 138/72 (BP Location: Right Arm, Patient Position: Sitting, Cuff Size: Normal)   Pulse 60   Temp (!) 96.6 F (35.9 C) (Temporal)   Resp 10   Ht 5' (1.524 m)   Wt 161 lb (73 kg)   LMP 08/11/1974   BMI 31.44 kg/m     General appearance: alert, cooperative and appears stated age Lymph:  no inguinal LAD noted  Pelvic: External genitalia: no hypopigmented lesions/findings present today, tissue is normal except labial fusion present at the labia minora anteriorly, no ulcerations present  Urethra:  normal appearing urethra with no masses, tenderness or lesions              Bartholins and Skenes: normal                 Vagina: normal appearing vagina with normal color and discharge, no lesions, swab obtained due to odor              Cervix: absent              Bimanual Exam:  Uterus:  uterus absent              Adnexa: no mass, fullness, tenderness              Anus:  normal sphincter tone, no lesions  As pt could not give urine specimen, cath u/a under sterile condition was obtained.  Cloudy urine present.    Chaperone, Terence Lux, CMA, was present for exam.  Assessment: Dysuria Lichen sclerosus  Plan: Urine micro and culture sent Macrobid 100mg  bid x 5 days to pharmacy Pt will taper down steroid now to two or three times weekly.  Recheck 6 weeks. RF for clobetasol and nystatin sent to pharmacy. Affirm pending

## 2019-09-01 NOTE — Telephone Encounter (Signed)
Scheduled reviewed with Loura Pardon, RN.   Call returned to patient. OV scheduled for today at 3:15pm with Dr. Sabra Heck.  Routing to provider for final review. Patient is agreeable to disposition. Will close encounter.

## 2019-09-01 NOTE — Telephone Encounter (Signed)
Spoke with patient. Patient reports vaginal odor, dysuria and chills at end of stream when voiding. Symptoms started last night. Denies flank pain, fever, vaginal d/c or bleeding. Covid19 prescreen negative, precautions review.   Advised patient OV needed for further evaluation, will need to review schedule with Dr. Sabra Heck and return call, patient agreeable.   Dr. Sabra Heck -please advise.

## 2019-09-01 NOTE — Telephone Encounter (Signed)
Patient has a bladder infection.

## 2019-09-02 LAB — URINALYSIS, MICROSCOPIC ONLY
RBC, Urine: >30 /hpf — AB (ref 0–2)
WBC, UA: >30 /hpf — AB (ref 0–5)

## 2019-09-02 LAB — VAGINITIS/VAGINOSIS, DNA PROBE
Candida Species: NEGATIVE
Gardnerella vaginalis: NEGATIVE
Trichomonas vaginosis: NEGATIVE

## 2019-09-04 LAB — URINE CULTURE

## 2019-09-05 NOTE — Addendum Note (Signed)
Addended by: Megan Salon on: 09/05/2019 06:24 PM   Modules accepted: Orders

## 2019-09-09 ENCOUNTER — Other Ambulatory Visit: Payer: Self-pay

## 2019-09-09 ENCOUNTER — Telehealth: Payer: Self-pay | Admitting: Obstetrics & Gynecology

## 2019-09-09 NOTE — Telephone Encounter (Signed)
Patient asked to talk with Dr.Miller's nurse. She has a question, no further details given.

## 2019-09-09 NOTE — Telephone Encounter (Signed)
Call placed to patients home number, phone rang and rang, no answer, no voicemail picked up.   Call to mobile number, Left message to call Sharee Pimple, RN at [facility].

## 2019-09-13 ENCOUNTER — Ambulatory Visit (INDEPENDENT_AMBULATORY_CARE_PROVIDER_SITE_OTHER): Payer: Medicare HMO | Admitting: Obstetrics & Gynecology

## 2019-09-13 ENCOUNTER — Other Ambulatory Visit: Payer: Self-pay

## 2019-09-13 ENCOUNTER — Encounter: Payer: Self-pay | Admitting: Obstetrics & Gynecology

## 2019-09-13 VITALS — BP 132/68 | HR 68 | Temp 97.0°F | Resp 10 | Ht 60.0 in | Wt 161.0 lb

## 2019-09-13 DIAGNOSIS — N3090 Cystitis, unspecified without hematuria: Secondary | ICD-10-CM

## 2019-09-13 DIAGNOSIS — Z01419 Encounter for gynecological examination (general) (routine) without abnormal findings: Secondary | ICD-10-CM | POA: Diagnosis not present

## 2019-09-13 DIAGNOSIS — R3129 Other microscopic hematuria: Secondary | ICD-10-CM | POA: Diagnosis not present

## 2019-09-13 NOTE — Telephone Encounter (Signed)
Patient seen in office on 10/02/19 for AEX.   Encounter closed.

## 2019-09-13 NOTE — Progress Notes (Signed)
84 y.o. G4P2 Married White or Caucasian female here for annual exam.  Feeling much better from 09/01/2019 visit.  Had e coli positive urine culture.  Treated with macrobid 100mg  bid x 5 days.    She had her Covid vaccination started last Friday.  Both she and her husband were vaccinated.  She had [place] vaccination.   Denies vaginal bleeding.     Patient's last menstrual period was 08/11/1974.          Sexually active: No.  The current method of family planning is status post hysterectomy.    Exercising: No.  The patient does not participate in regular exercise at present. Smoker:  no  Health Maintenance: Pap:  2005 Normal History of abnormal Pap:  no MMG:  03/23/19 BIRADS 2 benign/density a Colonoscopy:  2014 with Dr. Earlean Shawl BMD:   03/23/19 osteopenia  TDaP:  2016 Pneumonia vaccine(s):  completed Shingrix:   completed Hep C testing: n/a Screening Labs: PCP   reports that she quit smoking about 54 years ago. She has never used smokeless tobacco. She reports current alcohol use of about 1.0 standard drinks of alcohol per week. She reports that she does not use drugs.  Past Medical History:  Diagnosis Date  . Anxiety   . Colon polyps   . Complication of anesthesia    takes longer to wake up- admitted overnight after colonoscopy  . Depression   . GERD (gastroesophageal reflux disease)   . Glaucoma   . Headache    migraines  . Hx: UTI (urinary tract infection)   . Hypercholesteremia   . Hypertension   . Lichen simplex chronicus   . Lumbar stenosis with neurogenic claudication   . Macular degeneration   . Osteoarthritis   . Osteopenia    of the hip  . PONV (postoperative nausea and vomiting)   . Stress incontinence, female   . Unsteady gait   . Wears glasses   . Whooping cough    as a baby    Past Surgical History:  Procedure Laterality Date  . ABDOMINAL HYSTERECTOMY     ovaries remain  . ANTERIOR LAT LUMBAR FUSION N/A 10/26/2017   Procedure: LUMBAR THREE- LUMBAR  FOUR, LUMBAR FOUR- LUMBAR FIVE ANTEROLATERAL LUMBAR INTERBODY ARTHRODESIS;  Surgeon: Jovita Gamma, MD;  Location: [facility];  Service: Neurosurgery;  Laterality: N/A;  LUMBAR 3- LUMBAR 4, LUMBAR 4- LUMBAR 5 ANTEROLATERAL LUMBAR INTERBODY ARTHRODESIS, LUMBAR 3- LUMBAR 4, LUMBAR 4- LUMBAR 5 PERCUTANEOUS PEDICLE SCREW FIXATION  . APPENDECTOMY    . BLADDER REPAIR     x two  . CHOLECYSTECTOMY    . COLONOSCOPY    . EYE SURGERY  11/14, 12/14   Macular Dengeneration, Glaucoma  . EYE SURGERY  2016   left eye  . LESION REMOVAL Right 06/28/2013   Procedure: EXCISION 3 cm right labial sebaceous cyst;  Surgeon: Lyman Speller, MD;  Location: [facility];  Service: Gynecology;  Laterality: Right;  . LUMBAR LAMINECTOMY/DECOMPRESSION MICRODISCECTOMY Right 03/20/2016   Procedure: Right - Lumbar four-five lumbar laminotomy, foraminotomy, and possible microdiscectomy;  Surgeon: Jovita Gamma, MD;  Location: [facility];  Service: Neurosurgery;  Laterality: Right;  right  . LUMBAR PERCUTANEOUS PEDICLE SCREW 2 LEVEL  10/26/2017   Procedure: LUMBAR THREE- LUMBAR FOUR, LUMBAR FOUR- LUMBAR FIVE PERCUTANEOUS PEDICLE SCREW FIXATION;  Surgeon: Jovita Gamma, MD;  Location: [facility];  Service: Neurosurgery;;  . MOHS SURGERY     procedure to remove basal cell  . TONSILLECTOMY AND ADENOIDECTOMY    .  URETHRAL SLING  2007  . URETHRAL SLING  1/12   midurethral   . vertebroplasty secondary to traumatic compression fracture      Current Outpatient Medications  Medication Sig Dispense Refill  . acetaminophen (TYLENOL) 500 MG tablet Take 500 mg by mouth daily as needed for mild pain or headache.     . ALPRAZolam (XANAX) 0.5 MG tablet TAKE 1/2 TO 1 TABLET(0.25 TO 0.5 MG) BY MOUTH TWICE DAILY AS NEEDED FOR ANXIETY 20 tablet 0  . aspirin 81 MG tablet Take 81 mg by mouth daily.    . brimonidine-timolol (COMBIGAN) 0.2-0.5 % ophthalmic solution Place 1 drop into both eyes every 12 (twelve) hours.    . clobetasol ointment  (TEMOVATE) 0.05 % Apply thin layer every two to three days 30 g 0  . clotrimazole-betamethasone (LOTRISONE) cream APPLY 1 APPLICATION TOPICALLY TWICE DAILY FOR UPTO 10 DAYS 60 g 0  . ezetimibe-simvastatin (VYTORIN) 10-20 MG tablet TAKE 1 TABLET BY MOUTH ONCE DAILY 90 tablet 3  . nadolol (CORGARD) 40 MG tablet TAKE 1 TABLET(40 MG) BY MOUTH AT BEDTIME 90 tablet 2  . nystatin ointment (MYCOSTATIN) Apply to affected area 1-2 times daily.  Use for up to 10 days 30 g 0  . omeprazole (PRILOSEC) 40 MG capsule TAKE 1 CAPSULE(40 MG) BY MOUTH DAILY 90 capsule 0  . sodium chloride (OCEAN) 0.65 % SOLN nasal spray Place 1 spray into both nostrils 2 (two) times daily as needed for congestion.    . traZODone (DESYREL) 100 MG tablet TAKE 1/4 TO 1/2 TABLET BY MOUTH AT BEDTIME IF NEEDED FOR SLEEP 45 tablet 1  . venlafaxine XR (EFFEXOR-XR) 150 MG 24 hr capsule Take 1 capsule (150 mg total) by mouth 2 (two) times daily. 180 capsule 1  . vitamin B-12 (CYANOCOBALAMIN) 1000 MCG tablet Take 1,000 mcg by mouth daily.     No current facility-administered medications for this visit.    Family History  Problem Relation Age of Onset  . Congestive Heart Failure Mother   . Thyroid disease Mother   . Osteoporosis Mother   . Alcoholism Mother   . Prostate cancer Father   . Breast cancer Maternal Grandmother   . Hypertension Sister   . Thyroid disease Sister   . Cancer Sister        brain ?  Marland Kitchen Rheum arthritis Daughter     Review of Systems  All other systems reviewed and are negative.   Exam:   BP 132/68 (BP Location: Left Arm, Patient Position: Sitting, Cuff Size: Normal)   Pulse 68   Temp (!) 97 F (36.1 C) (Temporal)   Resp 10   Ht 5' (1.524 m)   Wt 161 lb (73 kg)   LMP 08/11/1974   BMI 31.44 kg/m   Height:   Height: 5' (152.4 cm)  Ht Readings from Last 3 Encounters:  09/13/19 5' (1.524 m)  09/01/19 5' (1.524 m)  06/16/19 5' (1.524 m)    General appearance: alert, cooperative and appears stated  age Head: Normocephalic, without obvious abnormality, atraumatic Neck: no adenopathy, supple, symmetrical, trachea midline and thyroid normal to inspection and palpation Lungs: clear to auscultation bilaterally Breasts: normal appearance, no masses or tenderness Heart: regular rate and rhythm Abdomen: soft, non-tender; bowel sounds normal; no masses,  no organomegaly Extremities: extremities normal, atraumatic, no cyanosis or edema Skin: Skin color, texture, turgor normal. No rashes or lesions Lymph nodes: Cervical, supraclavicular, and axillary nodes normal. No abnormal inguinal nodes palpated Neurologic: Grossly normal  Pelvic: External genitalia:  no lesions              Urethra:  normal appearing urethra with no masses, tenderness or lesions              Bartholins and Skenes: normal                 Vagina: normal appearing vagina with normal color and discharge, no lesions              Cervix: absent              Pap taken: No. Bimanual Exam:  Uterus:  uterus absent              Adnexa: no mass, fullness, tenderness               Rectovaginal: Confirms               Anus:  normal sphincter tone, no lesions  Chaperone, Terence Lux, CMA, was present for exam.  A:  Well Woman with normal exam PMP, no HRT Cystocele Urinary incontinence with recent UTI H/o TAH, ovaries remain H/o cystocele repair x 2 Hypertension Macular degeneration Lichen simplex chronicus  P:   Mammogram guidelines reviewed.  She still desires to continue. pap smear not indicated No additional colonoscopy testing recommended She will bring back urine for culture and micro.  Future orders have been placed. No RF needed for clobetasol at this itme. return annually or prn

## 2019-09-14 ENCOUNTER — Other Ambulatory Visit: Payer: Self-pay

## 2019-09-14 DIAGNOSIS — R3129 Other microscopic hematuria: Secondary | ICD-10-CM

## 2019-09-14 DIAGNOSIS — N3090 Cystitis, unspecified without hematuria: Secondary | ICD-10-CM

## 2019-09-15 ENCOUNTER — Telehealth: Payer: Self-pay | Admitting: Obstetrics & Gynecology

## 2019-09-15 LAB — URINALYSIS, MICROSCOPIC ONLY: Casts: NONE SEEN /lpf

## 2019-09-15 MED ORDER — NITROFURANTOIN MONOHYD MACRO 100 MG PO CAPS: 100.0000 mg | ORAL_CAPSULE | Freq: Two times a day (BID) | ORAL | 0 refills | Status: DC

## 2019-09-15 NOTE — Telephone Encounter (Signed)
Patient has a question for Dr.Miller's nurse, no further details given.

## 2019-09-15 NOTE — Telephone Encounter (Signed)
Spoke with patient. Advised as seen below per Dr. Sabra Heck.  Patient reports urinary frequency, voiding small amounts, symptoms started today. Denies any other symptoms. Patient asking if Dr. Sabra Heck would suggest starting on abx? Patient states she feels like she is developing a UTI. Advised patient I will review with Dr. Sabra Heck and f/u. Patient agreeable.    Mrs. Capanna, Your urine micro did not show any blood. This is good. The culture is still pending. I'll let you know about that when the results are finalized.  Thanks. ...  Written by Megan Salon, MD on 09/15/2019 9:09 AM EST   Dr. Sabra Heck -please advise.

## 2019-09-15 NOTE — Telephone Encounter (Signed)
Reviewed with Dr. Sabra Heck.   Patient previously tx for e coli UTI on 09/01/19 with Macrobid 100 mg bid x5 days. Ok to send Rx.   Spoke with patient. Advised as seen above, Rx to verified pharmacy. Advised patient our office will notify of final UC results once completed. Patient verbalizes understanding and is agreeable.   Routing to provider for final review. Patient is agreeable to disposition. Will close encounter.

## 2019-09-16 LAB — URINE CULTURE

## 2019-09-22 NOTE — Addendum Note (Signed)
Addended by: Megan Salon on: 09/22/2019 07:03 AM   Modules accepted: Orders

## 2019-09-23 ENCOUNTER — Ambulatory Visit: Payer: Self-pay

## 2019-09-23 DIAGNOSIS — N3090 Cystitis, unspecified without hematuria: Secondary | ICD-10-CM | POA: Diagnosis not present

## 2019-09-23 NOTE — Progress Notes (Deleted)
Pt's husband dropped off urine specimen from home for urine culture. Pt did not come into office. Lucy, [facility] rep obtained specimen and sent as ordered.

## 2019-09-25 LAB — URINE CULTURE

## 2019-09-29 ENCOUNTER — Other Ambulatory Visit: Payer: Self-pay | Admitting: Obstetrics & Gynecology

## 2019-10-03 ENCOUNTER — Telehealth: Payer: Self-pay

## 2019-10-03 NOTE — Telephone Encounter (Signed)
Called and spoke with pt husband and advised using ice on the area and for the rash/bumps try using powder and that this can be a reaction any time you get a vaccine. Pt husband states that pt does not have fever or any other COVID symptoms she tolerated the shot very well.

## 2019-10-03 NOTE — Telephone Encounter (Signed)
Patient husband is concern because wife received the 2nd covid vaccine on 09-30-19 around noon. Right arm is hot , red and red bumpy rash on her stomach and back, Patient do not have any other symptoms but patient would like to know what could be done to clear up this side  Effects. Please return patient call

## 2019-10-05 ENCOUNTER — Telehealth: Payer: Self-pay | Admitting: Obstetrics & Gynecology

## 2019-10-05 NOTE — Telephone Encounter (Signed)
Megan Salon, MD  09/25/2019 4:02 PM EST    Please let pt know her urine culture was negative. Thanks.     Spoke to pt. Pt states having burning with urination for 2 days now. Pt requests to see Dr Sabra Heck. Pt scheduled for OV at 1 pm on 10/06/2019. Pt agreeable. Pt declines earlier appt with another provider today.   Routing to Dr Sabra Heck for review and will close encounter.

## 2019-10-05 NOTE — Telephone Encounter (Signed)
Patient is asking to talk with [facility] nurse about her painful urination. To triage to assist with scheduling today.

## 2019-10-06 ENCOUNTER — Ambulatory Visit: Payer: Self-pay | Admitting: Obstetrics & Gynecology

## 2019-10-06 ENCOUNTER — Telehealth: Payer: Self-pay | Admitting: Obstetrics & Gynecology

## 2019-10-06 NOTE — Telephone Encounter (Signed)
Called patient for missed appointment, did not remember that she had an appointment scheduled for today. Rescheduled for 9:00 am 10/07/19.

## 2019-10-07 ENCOUNTER — Ambulatory Visit (INDEPENDENT_AMBULATORY_CARE_PROVIDER_SITE_OTHER): Payer: Medicare HMO | Admitting: Obstetrics & Gynecology

## 2019-10-07 ENCOUNTER — Other Ambulatory Visit: Payer: Self-pay

## 2019-10-07 ENCOUNTER — Encounter: Payer: Self-pay | Admitting: Obstetrics & Gynecology

## 2019-10-07 VITALS — BP 116/60 | HR 68 | Temp 97.6°F | Resp 12 | Ht 63.0 in | Wt 159.6 lb

## 2019-10-07 DIAGNOSIS — R339 Retention of urine, unspecified: Secondary | ICD-10-CM | POA: Diagnosis not present

## 2019-10-07 DIAGNOSIS — R300 Dysuria: Secondary | ICD-10-CM

## 2019-10-07 LAB — POCT URINALYSIS DIPSTICK
Bilirubin, UA: NEGATIVE
Glucose, UA: NEGATIVE
Ketones, UA: NEGATIVE
Nitrite, UA: POSITIVE
Protein, UA: POSITIVE — AB
Urobilinogen, UA: 0.2 E.U./dL
pH, UA: 6 (ref 5.0–8.0)

## 2019-10-07 MED ORDER — SULFAMETHOXAZOLE-TRIMETHOPRIM 800-160 MG PO TABS: 1.0000 | ORAL_TABLET | Freq: Two times a day (BID) | ORAL | 0 refills | Status: DC

## 2019-10-07 NOTE — Progress Notes (Signed)
GYNECOLOGY  VISIT  CC:   Patient complains of having burning with urination  HPI: 84 y.o. G4P2 Married White or Caucasian female here for burning with urination that started about four days ago.  Reports this symptom is not with each urination.  She always has urinary urgency but she is having increased urinary urgency.  She does have increase urine odor over the past 7 days.  She has not seen blood in her urine but she's noticed a little brownish discoloration on her pad that she thinks was urine.  She brought a small amount of urine in a sterile cut that could be dipped.  She never can urinate in our office so will need catheterization today.    Urine: 3+ RBC, 3+ WBC, +Nitrite, +Protein, Yellow, Cloudy  GYNECOLOGIC HISTORY: Patient's last menstrual period was 08/11/1974. Contraception: Hysterectomy  Menopausal hormone therapy: none  Patient Active Problem List   Diagnosis Date Noted  . Osteopenia of neck of right femur 03/29/2019  . Chest pain 01/24/2018  . Liver lesion 11/10/2017  . Nausea & vomiting 10/31/2017  . Chronic daily headache 07/08/2017  . Memory changes 03/06/2017  . Lumbar stenosis with neurogenic claudication 03/20/2016  . Scalp pain 10/22/2015  . Post-traumatic headache 10/11/2015  . Routine health maintenance 03/06/2013  . Macular degeneration of right eye 03/03/2013  . Glaucoma 03/03/2013  . Hyperlipidemia 03/14/2009  . Depression 10/19/2007  . Essential hypertension 10/19/2007  . GASTROESOPHAGEAL REFLUX DISEASE 10/19/2007  . Osteoarthritis 10/19/2007  . STRESS INCONTINENCE 10/19/2007  . Allergic rhinitis 10/19/2007    Past Medical History:  Diagnosis Date  . Anxiety   . Colon polyps   . Complication of anesthesia    takes longer to wake up- admitted overnight after colonoscopy  . Depression   . GERD (gastroesophageal reflux disease)   . Glaucoma   . Headache    migraines  . Hx: UTI (urinary tract infection)   . Hypercholesteremia   . Hypertension    . Lichen simplex chronicus   . Lumbar stenosis with neurogenic claudication   . Macular degeneration   . Osteoarthritis   . Osteopenia    of the hip  . PONV (postoperative nausea and vomiting)   . Stress incontinence, female   . Unsteady gait   . Wears glasses   . Whooping cough    as a baby    Past Surgical History:  Procedure Laterality Date  . ABDOMINAL HYSTERECTOMY  1977   ovaries remain  . ANTERIOR LAT LUMBAR FUSION N/A 10/26/2017   Procedure: LUMBAR THREE- LUMBAR FOUR, LUMBAR FOUR- LUMBAR FIVE ANTEROLATERAL LUMBAR INTERBODY ARTHRODESIS;  Surgeon: Jovita Gamma, MD;  Location: [facility];  Service: Neurosurgery;  Laterality: N/A;  LUMBAR 3- LUMBAR 4, LUMBAR 4- LUMBAR 5 ANTEROLATERAL LUMBAR INTERBODY ARTHRODESIS, LUMBAR 3- LUMBAR 4, LUMBAR 4- LUMBAR 5 PERCUTANEOUS PEDICLE SCREW FIXATION  . APPENDECTOMY    . BLADDER REPAIR     x two  . CHOLECYSTECTOMY    . COLONOSCOPY    . EYE SURGERY  11/14, 12/14   Macular Dengeneration, Glaucoma  . EYE SURGERY  2016   left eye  . LESION REMOVAL Right 06/28/2013   Procedure: EXCISION 3 cm right labial sebaceous cyst;  Surgeon: Lyman Speller, MD;  Location: [facility];  Service: Gynecology;  Laterality: Right;  . LUMBAR LAMINECTOMY/DECOMPRESSION MICRODISCECTOMY Right 03/20/2016   Procedure: Right - Lumbar four-five lumbar laminotomy, foraminotomy, and possible microdiscectomy;  Surgeon: Jovita Gamma, MD;  Location: [facility];  Service: Neurosurgery;  Laterality: Right;  right  . LUMBAR PERCUTANEOUS PEDICLE SCREW 2 LEVEL  10/26/2017   Procedure: LUMBAR THREE- LUMBAR FOUR, LUMBAR FOUR- LUMBAR FIVE PERCUTANEOUS PEDICLE SCREW FIXATION;  Surgeon: Jovita Gamma, MD;  Location: [facility];  Service: Neurosurgery;;  . MOHS SURGERY     procedure to remove basal cell  . TONSILLECTOMY AND ADENOIDECTOMY    . URETHRAL SLING  2007  . URETHRAL SLING  1/12   midurethral   . vertebroplasty secondary to traumatic compression fracture      MEDS:    Current Outpatient Medications on File Prior to Visit  Medication Sig Dispense Refill  . acetaminophen (TYLENOL) 500 MG tablet Take 500 mg by mouth daily as needed for mild pain or headache.     . ALPRAZolam (XANAX) 0.5 MG tablet TAKE 1/2 TO 1 TABLET(0.25 TO 0.5 MG) BY MOUTH TWICE DAILY AS NEEDED FOR ANXIETY 20 tablet 0  . aspirin 81 MG tablet Take 81 mg by mouth daily.    . brimonidine-timolol (COMBIGAN) 0.2-0.5 % ophthalmic solution Place 1 drop into both eyes every 12 (twelve) hours.    . clobetasol ointment (TEMOVATE) 0.05 % Apply thin layer every two to three days 30 g 0  . clotrimazole-betamethasone (LOTRISONE) cream APPLY 1 APPLICATION TOPICALLY TWICE DAILY FOR UPTO 10 DAYS 60 g 0  . ezetimibe-simvastatin (VYTORIN) 10-20 MG tablet TAKE 1 TABLET BY MOUTH ONCE DAILY 90 tablet 3  . nadolol (CORGARD) 40 MG tablet TAKE 1 TABLET(40 MG) BY MOUTH AT BEDTIME 90 tablet 2  . nystatin ointment (MYCOSTATIN) Apply to affected area 1-2 times daily.  Use for up to 10 days 30 g 0  . omeprazole (PRILOSEC) 40 MG capsule TAKE 1 CAPSULE(40 MG) BY MOUTH DAILY 90 capsule 0  . sodium chloride (OCEAN) 0.65 % SOLN nasal spray Place 1 spray into both nostrils 2 (two) times daily as needed for congestion.    . traZODone (DESYREL) 100 MG tablet TAKE 1/4 TO 1/2 TABLET BY MOUTH AT BEDTIME IF NEEDED FOR SLEEP 45 tablet 1  . venlafaxine XR (EFFEXOR-XR) 150 MG 24 hr capsule Take 1 capsule (150 mg total) by mouth 2 (two) times daily. 180 capsule 1  . vitamin B-12 (CYANOCOBALAMIN) 1000 MCG tablet Take 1,000 mcg by mouth daily.     No current facility-administered medications on file prior to visit.    ALLERGIES: Citrate of magnesia, Codeine, and Shingrix [zoster vac recomb adjuvanted]  Family History  Problem Relation Age of Onset  . Congestive Heart Failure Mother   . Thyroid disease Mother   . Osteoporosis Mother   . Alcoholism Mother   . Prostate cancer Father   . Breast cancer Maternal Grandmother   .  Hypertension Sister   . Thyroid disease Sister   . Cancer Sister        brain ?  Marland Kitchen Rheum arthritis Daughter     SH:  Married, non smoker  Review of Systems  Genitourinary:       Burning with urination  All other systems reviewed and are negative.   PHYSICAL EXAMINATION:    BP 116/60 (BP Location: Left Arm, Patient Position: Sitting, Cuff Size: Normal)   Pulse 68   Temp 97.6 F (36.4 C) (Temporal)   Resp 12   Ht 5\' 3"  (1.6 m)   Wt 159 lb 9.6 oz (72.4 kg)   LMP 08/11/1974   BMI 28.27 kg/m     General appearance: alert, cooperative and appears stated age Flank:  No CVA tendernessAbdomen: soft,  non-tender; bowel sounds normal; no masses,  no organomegaly Lymph:  no inguinal LAD noted  Pelvic: External genitalia:  no lesions              Urethra:  normal appearing urethra with no masses, tenderness or lesions              Bartholins and Skenes: normal                 Vagina: normal appearing vagina with normal color and discharge, no lesions, 3rd degree cystocele noted today              Cervix: no lesions              Bimanual Exam:  Uterus:  normal size, contour, position, consistency, mobility, non-tender              Adnexa: no mass, fullness, tenderness   In and out cath u/a performed under sterile conditions due to urinary retension   Chaperone, Evern Core, RN, was present for exam.  Assessment: Probable cystitis with dysuria Urinary retention requiring cath u/a for urine sample  Plan: Urine micro and culture pending Bactrim DS BID x 5 days May need to start suppressive therapy after this clears

## 2019-10-08 LAB — URINALYSIS, MICROSCOPIC ONLY: WBC, UA: >30 /hpf — AB (ref 0–5)

## 2019-10-09 LAB — URINE CULTURE

## 2019-10-13 ENCOUNTER — Other Ambulatory Visit: Payer: Self-pay

## 2019-10-13 ENCOUNTER — Other Ambulatory Visit: Payer: Self-pay | Admitting: Obstetrics & Gynecology

## 2019-10-13 MED ORDER — NADOLOL 40 MG PO TABS: ORAL_TABLET | ORAL | 2 refills | Status: DC

## 2019-10-13 MED ORDER — EZETIMIBE-SIMVASTATIN 10-20 MG PO TABS: 1.0000 | ORAL_TABLET | Freq: Every day | ORAL | 3 refills | Status: DC

## 2019-10-13 NOTE — Telephone Encounter (Signed)
Medication refill request: nystatin cream  Last AEX:  09/13/19 Next AEX: none scheduled at this time  Last MMG (if hormonal medication request): NA  Refill authorized: 30g with 0 RF pended for today.

## 2019-10-24 ENCOUNTER — Ambulatory Visit (INDEPENDENT_AMBULATORY_CARE_PROVIDER_SITE_OTHER): Payer: Medicare HMO | Admitting: Obstetrics & Gynecology

## 2019-10-24 ENCOUNTER — Other Ambulatory Visit: Payer: Self-pay

## 2019-10-24 DIAGNOSIS — N3090 Cystitis, unspecified without hematuria: Secondary | ICD-10-CM

## 2019-10-24 DIAGNOSIS — R300 Dysuria: Secondary | ICD-10-CM

## 2019-10-24 NOTE — Progress Notes (Signed)
Pt here to give urine for urine culture per Dr Sabra Heck from f/u lab results on 10/07/2019 after finishing antibiotics.    Pt cannot provide sample. Spoke with Dr Sabra Heck. Pt to collect at home and bring back to office. Pt given sterile cup and wipes. Pt verbalized understanding.    3:30 pm Pt dropped off urine for culture.

## 2019-10-26 LAB — URINE CULTURE

## 2019-10-28 ENCOUNTER — Other Ambulatory Visit: Payer: Self-pay | Admitting: Obstetrics & Gynecology

## 2019-10-28 DIAGNOSIS — N390 Urinary tract infection, site not specified: Secondary | ICD-10-CM

## 2019-10-28 MED ORDER — NITROFURANTOIN MONOHYD MACRO 100 MG PO CAPS: 100.0000 mg | ORAL_CAPSULE | Freq: Two times a day (BID) | ORAL | 0 refills | Status: DC

## 2019-10-28 NOTE — Progress Notes (Signed)
macrobid order sent to pharmacy

## 2019-10-31 ENCOUNTER — Telehealth: Payer: Self-pay

## 2019-10-31 DIAGNOSIS — N390 Urinary tract infection, site not specified: Secondary | ICD-10-CM

## 2019-10-31 NOTE — Telephone Encounter (Signed)
Patient called regarding blood in urine °

## 2019-10-31 NOTE — Telephone Encounter (Signed)
Call reviewed with Dr. Sabra Heck.  Confirmed with [facility] Macrobid Rx received on 10/28/19, Rx is ready for pick up.   Call returned to patient. Advised patient to start [place] as prescribed. Order placed for referral to [facility] for recurrent UTIs. Our office will f/u with appt details once scheduled. Pateint is aware to call if symptoms do not improve or resolve. Patient verbalizes understanding and is agreeable.   Routing to provider for final review. Patient is agreeable to disposition. Will close encounter.  Cc: Magdalene Patricia

## 2019-10-31 NOTE — Telephone Encounter (Signed)
Spoke with patient. Patient was notified on 10/24/19 urine culture results by Dr. Sabra Heck on 10/28/19. Patient reports blood in urine and pressure that is not resolving. Advised patient per review of chart, Rx for Macrobid sent on 3/19, did she start medication? Patient did not start Rx. Advised patient I will review plan of care with Dr. Sabra Heck and return call, patient agreeable.

## 2019-11-04 DIAGNOSIS — B078 Other viral warts: Secondary | ICD-10-CM | POA: Diagnosis not present

## 2019-11-04 DIAGNOSIS — L920 Granuloma annulare: Secondary | ICD-10-CM | POA: Diagnosis not present

## 2019-11-09 ENCOUNTER — Telehealth: Payer: Self-pay

## 2019-11-09 NOTE — Telephone Encounter (Signed)
Patient called in regards to pain and burning when urinating. Patient would like to speak with nurse.

## 2019-11-09 NOTE — Telephone Encounter (Signed)
I just talked to [name]- she said she cannot schedule an appointment with [facility] just yet. Her husband has cancer and has an appointment on Monday to determine if he will be admitted to the hospital to check his heart. She is going to call me on Tuesday with her availability.

## 2019-11-09 NOTE — Telephone Encounter (Signed)
Spoke with patient. Patient reports dysuria that started today. Tx for UTI on 10/31/19 with Macrobid 100 mg bid x5 days. Patient has completed abx. Referral is pending for urology for recurrent UTIs. Patient denies flank pain, hematuria, fever/chills, vaginal d/c, odor.   Advised patient OV recommended for further evaluation, patient declines OV for today, states she is unable to leave her husband alone today. OV scheduled for 4/1 at 3:30pm with Dr. Talbert Nan. ER/Urgent Care precautions reviewed. Covid 19 prescreen negative. Patient verbalizes understanding and is agreeable.   Rosa -can you f/u on referral to Urology? Patient request to schedule with Dr. Claudia Desanctis.   Cc: Dr. Talbert Nan

## 2019-11-10 ENCOUNTER — Ambulatory Visit: Payer: Self-pay | Admitting: Obstetrics and Gynecology

## 2019-11-10 NOTE — Telephone Encounter (Signed)
Patient cancelled appointment today because she is feeling better. Will call back to reschedule if need to.

## 2019-11-14 ENCOUNTER — Other Ambulatory Visit: Payer: Self-pay | Admitting: Obstetrics & Gynecology

## 2019-11-14 ENCOUNTER — Other Ambulatory Visit (INDEPENDENT_AMBULATORY_CARE_PROVIDER_SITE_OTHER): Payer: Medicare HMO

## 2019-11-14 ENCOUNTER — Telehealth: Payer: Self-pay | Admitting: Obstetrics & Gynecology

## 2019-11-14 DIAGNOSIS — R300 Dysuria: Secondary | ICD-10-CM | POA: Diagnosis not present

## 2019-11-14 LAB — POCT URINALYSIS DIPSTICK
Bilirubin, UA: NEGATIVE
Blood, UA: POSITIVE
Glucose, UA: NEGATIVE
Ketones, UA: NEGATIVE
Nitrite, UA: NEGATIVE
Protein, UA: POSITIVE — AB
Spec Grav, UA: 1.01 (ref 1.010–1.025)
Urobilinogen, UA: 0.2 E.U./dL
pH, UA: 5 (ref 5.0–8.0)

## 2019-11-14 MED ORDER — NITROFURANTOIN MONOHYD MACRO 100 MG PO CAPS: 100.0000 mg | ORAL_CAPSULE | Freq: Two times a day (BID) | ORAL | 0 refills | Status: DC

## 2019-11-14 NOTE — Telephone Encounter (Signed)
Left detailed message per DPR with pts Rx. Pt to return call to office if any questions or concerns.

## 2019-11-14 NOTE — Addendum Note (Signed)
Addended by: Georgia Lopes on: 11/14/2019 05:27 PM   Modules accepted: Orders

## 2019-11-14 NOTE — Progress Notes (Signed)
Please see telephone encounter dated 11/14/2019.

## 2019-11-14 NOTE — Telephone Encounter (Signed)
Patient's husband came to pick up specimen cups for a urine sample. Advised Para March to give the urine cups and wipes to patient's spouse and we will call the patient once we speak with Dr. Sabra Heck regarding symptoms.   See telephone encounter dated 11/09/19.

## 2019-11-14 NOTE — Telephone Encounter (Signed)
Patient dropped off her urine specimen and asked if someone could call her this afternoon.

## 2019-11-14 NOTE — Telephone Encounter (Signed)
Please call pt and let her know this looks like a UTI again.  Rx for macrobid 100mg  bid x 7 days to pharmacy sent.

## 2019-11-14 NOTE — Telephone Encounter (Signed)
Spoke with Dr. Sabra Heck. Per Dr. Sabra Heck, okay for patient to collect a clean-catch urine and bring it back to our office. Patient states that she does not have any new symptoms, only burning with urination and "chills" after urination.

## 2019-11-14 NOTE — Telephone Encounter (Signed)
Urine dipped. 2 + leuks, moderate blood, and trace protein. Orders placed for urinalysis and urine culture to be sent out today.

## 2019-11-14 NOTE — Telephone Encounter (Signed)
Patient asked if her spouse can come over an pick up a urine specimen container?

## 2019-11-15 LAB — URINALYSIS, MICROSCOPIC ONLY
Bacteria, UA: NONE SEEN
Casts: NONE SEEN /lpf
Epithelial Cells (non renal): NONE SEEN /hpf (ref 0–10)
WBC, UA: >30 /hpf — AB (ref 0–5)

## 2019-11-16 LAB — URINE CULTURE

## 2019-11-21 ENCOUNTER — Other Ambulatory Visit: Payer: Self-pay

## 2019-11-21 DIAGNOSIS — N390 Urinary tract infection, site not specified: Secondary | ICD-10-CM

## 2019-11-21 DIAGNOSIS — R300 Dysuria: Secondary | ICD-10-CM

## 2019-11-21 MED ORDER — TRIMETHOPRIM 100 MG PO TABS: 100.0000 mg | ORAL_TABLET | Freq: Every day | ORAL | 0 refills | Status: DC

## 2019-11-21 NOTE — Telephone Encounter (Signed)
Call to pt. Spoke with pt. Pt states feeling better since taking Macrobid, but only having slight burning. Pt will finish abx on 4/13. Pt given instructions on taking new med trimethoprim to help prevent future UTIs. Pt agreeable. Pharmacy verified.  Pt states has [facility] appt on 4/21 with Azucena Fallen, [place]. Pt encouraged to keep appt. Pt verbalized understanding.   Does pt still need repeat urine culture 1 week after abx finished?   Routing to Dr Sabra Heck for update and advice and for Rx # and Rf?

## 2019-11-21 NOTE — Telephone Encounter (Signed)
-----   Message from Megan Salon, MD sent at 11/20/2019  6:49 AM EDT ----- Please call and check on pt.  Her urine culture showed e coli and should have been sensitive to the macrobid.  She does really need to see urology when she can.  Husband has recurrent cancer so she has some stressors she is dealing with at this time.  Once she is done with the antibiotics, I want her to start on trimethoprim 100mg  daily to try and prevent another UTI.  Also, I want to repeat the urine culture in one week.  Ok to send in rx for pt.  Thanks.

## 2019-11-21 NOTE — Telephone Encounter (Signed)
Spoke with pt. Pt updated with Dr Ammie Ferrier recommendations. Pt to call back to office and have repeat UC if doesn't have one at urology consult. Pt agreeable and verbalized understanding.   Rx Trimpex # 30, 0RF sent to pharmacy on file.  Routing to Dr Sabra Heck for review.  Encounter closed.

## 2019-11-22 ENCOUNTER — Telehealth: Payer: Self-pay | Admitting: Obstetrics & Gynecology

## 2019-11-22 NOTE — Telephone Encounter (Signed)
Spoke with pt. Pt wanting clarification on when to start new Rx Trimpex. Pt given instructions per Dr Sabra Heck on result note dated 11/14/2019 to start Trimpex after finishing Macrobid Rx for UTI. Pt states will finish Macrobid today and start Trimpex Rx tomorrow 11/23/19. Pt verbalized understanding and is agreeable.   Routing to Dr Sabra Heck for review.  Encounter closed.

## 2019-11-22 NOTE — Telephone Encounter (Signed)
Patient has a question about a new prescription.

## 2019-11-29 DIAGNOSIS — H401132 Primary open-angle glaucoma, bilateral, moderate stage: Secondary | ICD-10-CM | POA: Diagnosis not present

## 2019-11-29 DIAGNOSIS — H5203 Hypermetropia, bilateral: Secondary | ICD-10-CM | POA: Diagnosis not present

## 2019-11-29 DIAGNOSIS — H5340 Unspecified visual field defects: Secondary | ICD-10-CM | POA: Diagnosis not present

## 2019-11-30 DIAGNOSIS — N3942 Incontinence without sensory awareness: Secondary | ICD-10-CM | POA: Diagnosis not present

## 2019-11-30 DIAGNOSIS — R351 Nocturia: Secondary | ICD-10-CM | POA: Diagnosis not present

## 2019-12-29 ENCOUNTER — Encounter (HOSPITAL_COMMUNITY): Payer: Self-pay | Admitting: Emergency Medicine

## 2019-12-29 ENCOUNTER — Emergency Department (HOSPITAL_COMMUNITY): Payer: Medicare HMO

## 2019-12-29 ENCOUNTER — Other Ambulatory Visit: Payer: Self-pay | Admitting: Obstetrics & Gynecology

## 2019-12-29 ENCOUNTER — Other Ambulatory Visit: Payer: Self-pay

## 2019-12-29 ENCOUNTER — Emergency Department (HOSPITAL_COMMUNITY)
Admission: EM | Admit: 2019-12-29 | Discharge: 2019-12-29 | Disposition: A | Discharge status: Home / Self Care | Payer: Medicare HMO | Attending: Emergency Medicine | Admitting: Emergency Medicine

## 2019-12-29 DIAGNOSIS — I10 Essential (primary) hypertension: Secondary | ICD-10-CM | POA: Diagnosis not present

## 2019-12-29 DIAGNOSIS — Z79899 Other long term (current) drug therapy: Secondary | ICD-10-CM | POA: Diagnosis not present

## 2019-12-29 DIAGNOSIS — M79605 Pain in left leg: Secondary | ICD-10-CM | POA: Diagnosis not present

## 2019-12-29 DIAGNOSIS — M79662 Pain in left lower leg: Secondary | ICD-10-CM | POA: Diagnosis not present

## 2019-12-29 DIAGNOSIS — R2242 Localized swelling, mass and lump, left lower limb: Secondary | ICD-10-CM | POA: Insufficient documentation

## 2019-12-29 NOTE — ED Triage Notes (Signed)
Pt reports was in kitchen while ago cooking squash when started having left leg. Has bruising on shin, unsure when that came up. Pain is worse with weight bearing. Denies injuries that she is aware of.

## 2019-12-29 NOTE — Discharge Instructions (Addendum)
The ultrasound has been ordered for tomorrow.  You will be contacted about the time to get it done

## 2019-12-29 NOTE — ED Provider Notes (Signed)
[facility] DEPT Provider Note   CSN: [id number] Arrival date & time: 12/29/19  1752     History Chief Complaint  Patient presents with  . Leg Pain    [name] is a 84 y.o. female.  HPI Patient presents with left lower leg pain.  She was cooking began to have left leg pain.  There is a bruise on her left shin.  Does not know where it came from.  Worse with walking.  States she has called her PCP and she states he was worried about blood clot.  States she does bump into things.  No numbness weakness.  Pain with ambulating.  No chest pain or trouble breathing.  States she always has some swelling on the leg.    Past Medical History:  Diagnosis Date  . Anxiety   . Colon polyps   . Complication of anesthesia    takes longer to wake up- admitted overnight after colonoscopy  . Depression   . GERD (gastroesophageal reflux disease)   . Glaucoma   . Headache    migraines  . Hx: UTI (urinary tract infection)   . Hypercholesteremia   . Hypertension   . Lichen simplex chronicus   . Lumbar stenosis with neurogenic claudication   . Macular degeneration   . Osteoarthritis   . Osteopenia    of the hip  . PONV (postoperative nausea and vomiting)   . Stress incontinence, female   . Unsteady gait   . Wears glasses   . Whooping cough    as a baby    Patient Active Problem List   Diagnosis Date Noted  . Osteopenia of neck of right femur 03/29/2019  . Chest pain 01/24/2018  . Liver lesion 11/10/2017  . Nausea & vomiting 10/31/2017  . Chronic daily headache 07/08/2017  . Memory changes 03/06/2017  . Lumbar stenosis with neurogenic claudication 03/20/2016  . Scalp pain 10/22/2015  . Post-traumatic headache 10/11/2015  . Routine health maintenance 03/06/2013  . Macular degeneration of right eye 03/03/2013  . Glaucoma 03/03/2013  . Hyperlipidemia 03/14/2009  . Depression 10/19/2007  . Essential hypertension 10/19/2007  . GASTROESOPHAGEAL  REFLUX DISEASE 10/19/2007  . Osteoarthritis 10/19/2007  . STRESS INCONTINENCE 10/19/2007  . Allergic rhinitis 10/19/2007    Past Surgical History:  Procedure Laterality Date  . ABDOMINAL HYSTERECTOMY  1977   ovaries remain  . ANTERIOR LAT LUMBAR FUSION N/A 10/26/2017   Procedure: LUMBAR THREE- LUMBAR FOUR, LUMBAR FOUR- LUMBAR FIVE ANTEROLATERAL LUMBAR INTERBODY ARTHRODESIS;  Surgeon: Jovita Gamma, MD;  Location: [facility];  Service: Neurosurgery;  Laterality: N/A;  LUMBAR 3- LUMBAR 4, LUMBAR 4- LUMBAR 5 ANTEROLATERAL LUMBAR INTERBODY ARTHRODESIS, LUMBAR 3- LUMBAR 4, LUMBAR 4- LUMBAR 5 PERCUTANEOUS PEDICLE SCREW FIXATION  . APPENDECTOMY    . BLADDER REPAIR     x two  . CHOLECYSTECTOMY    . COLONOSCOPY    . EYE SURGERY  11/14, 12/14   Macular Dengeneration, Glaucoma  . EYE SURGERY  2016   left eye  . LESION REMOVAL Right 06/28/2013   Procedure: EXCISION 3 cm right labial sebaceous cyst;  Surgeon: Lyman Speller, MD;  Location: [facility];  Service: Gynecology;  Laterality: Right;  . LUMBAR LAMINECTOMY/DECOMPRESSION MICRODISCECTOMY Right 03/20/2016   Procedure: Right - Lumbar four-five lumbar laminotomy, foraminotomy, and possible microdiscectomy;  Surgeon: Jovita Gamma, MD;  Location: [facility];  Service: Neurosurgery;  Laterality: Right;  right  . LUMBAR PERCUTANEOUS PEDICLE SCREW 2 LEVEL  10/26/2017  Procedure: LUMBAR THREE- LUMBAR FOUR, LUMBAR FOUR- LUMBAR FIVE PERCUTANEOUS PEDICLE SCREW FIXATION;  Surgeon: Jovita Gamma, MD;  Location: [facility];  Service: Neurosurgery;;  . MOHS SURGERY     procedure to remove basal cell  . TONSILLECTOMY AND ADENOIDECTOMY    . URETHRAL SLING  2007  . URETHRAL SLING  1/12   midurethral   . vertebroplasty secondary to traumatic compression fracture       OB History    Gravida  4   Para  2   Term      Preterm      AB      Living  2     SAB      TAB      Ectopic      Multiple      Live Births              Family  History  Problem Relation Age of Onset  . Congestive Heart Failure Mother   . Thyroid disease Mother   . Osteoporosis Mother   . Alcoholism Mother   . Prostate cancer Father   . Breast cancer Maternal Grandmother   . Hypertension Sister   . Thyroid disease Sister   . Cancer Sister        brain ?  Marland Kitchen Rheum arthritis Daughter     Social History   Tobacco Use  . Smoking status: Former Smoker    Quit date: 08/11/1965    Years since quitting: 54.4  . Smokeless tobacco: Never Used  . Tobacco comment: has not smoked in 45 years   Substance Use Topics  . Alcohol use: Yes    Alcohol/week: 1.0 standard drinks    Types: 1 Standard drinks or equivalent per week    Comment: occ   . Drug use: No    Home Medications Prior to Admission medications   Medication Sig Start Date End Date Taking? Authorizing Provider  ALPRAZolam (XANAX) 0.5 MG tablet TAKE 1/2 TO 1 TABLET(0.25 TO 0.5 MG) BY MOUTH TWICE DAILY AS NEEDED FOR ANXIETY Patient taking differently: Take 0.25-0.5 mg by mouth daily as needed for anxiety.  07/05/18  Yes Marin Olp, MD  aspirin 81 MG tablet Take 81 mg by mouth daily.   Yes [provider]  brimonidine-timolol (COMBIGAN) 0.2-0.5 % ophthalmic solution Place 1 drop into both eyes every 12 (twelve) hours.   Yes [provider]  ezetimibe-simvastatin (VYTORIN) 10-20 MG tablet Take 1 tablet by mouth daily. 10/13/19  Yes Marin Olp, MD  nadolol (CORGARD) 40 MG tablet TAKE 1 TABLET(40 MG) BY MOUTH AT BEDTIME 10/13/19  Yes Marin Olp, MD  omeprazole (PRILOSEC) 40 MG capsule TAKE 1 CAPSULE(40 MG) BY MOUTH DAILY Patient taking differently: Take 40 mg by mouth daily.  05/30/19  Yes Marin Olp, MD  sodium chloride (OCEAN) 0.65 % SOLN nasal spray Place 1 spray into both nostrils 2 (two) times daily as needed for congestion.   Yes [provider]  traZODone (DESYREL) 100 MG tablet TAKE 1/4 TO 1/2 TABLET BY MOUTH AT BEDTIME IF NEEDED FOR  SLEEP Patient taking differently: Take 25 mg by mouth at bedtime.  01/12/19  Yes Marin Olp, MD  venlafaxine XR (EFFEXOR-XR) 150 MG 24 hr capsule Take 1 capsule (150 mg total) by mouth 2 (two) times daily. 07/15/19  Yes Marin Olp, MD  acetaminophen (TYLENOL) 500 MG tablet Take 500 mg by mouth daily as needed for mild pain or headache.  [provider]  clobetasol ointment (TEMOVATE) 0.05 % Apply thin layer every two to three days Patient not taking: Reported on 12/29/2019 09/01/19   Megan Salon, MD  clotrimazole-betamethasone (LOTRISONE) cream APPLY 1 APPLICATION TOPICALLY TWICE DAILY FOR UPTO 10 DAYS Patient not taking: Reported on 12/29/2019 01/26/19   Marin Olp, MD  nitrofurantoin, macrocrystal-monohydrate, (MACROBID) 100 MG capsule Take 1 capsule (100 mg total) by mouth 2 (two) times daily. Patient not taking: Reported on 12/29/2019 10/28/19   Megan Salon, MD  nitrofurantoin, macrocrystal-monohydrate, (MACROBID) 100 MG capsule Take 1 capsule (100 mg total) by mouth 2 (two) times daily. Patient not taking: Reported on 12/29/2019 11/14/19   Megan Salon, MD  nystatin ointment (MYCOSTATIN) APPLY TO THE AFFECTED AREA 1 TO 2 TIMES A DAY. USE FOR UP TO 10 DAYS Patient not taking: Reported on 12/29/2019 10/13/19   Megan Salon, MD  sulfamethoxazole-trimethoprim (BACTRIM DS) 800-160 MG tablet Take 1 tablet by mouth 2 (two) times daily. Patient not taking: Reported on 12/29/2019 10/07/19   Megan Salon, MD  trimethoprim (TRIMPEX) 100 MG tablet Take 1 tablet (100 mg total) by mouth daily. Patient not taking: Reported on 12/29/2019 11/23/19   Megan Salon, MD    Allergies    Citrate of magnesia, Codeine, and Shingrix [zoster vac recomb adjuvanted]  Review of Systems   Review of Systems  Constitutional: Negative for appetite change, chills and fever.  Respiratory: Negative for cough and shortness of breath.   Cardiovascular: Negative for chest pain.  Genitourinary:  Negative for flank pain.  Musculoskeletal: Negative for back pain.       Left lower leg pain with some mild swelling.  Skin: Negative for rash.  Hematological: Bruises/bleeds easily.  Psychiatric/Behavioral: Negative for confusion.    Physical Exam Updated Vital Signs BP (!) 150/80   Pulse 70   Temp 98.1 F (36.7 C) (Oral)   Resp 16   LMP 08/11/1974   SpO2 99%   Physical Exam Vitals and nursing note reviewed.  HENT:     Head: Atraumatic.  Cardiovascular:     Rate and Rhythm: Regular rhythm.  Musculoskeletal:     Cervical back: Neck supple.     Comments: Approximately 3 cm ecchymotic area over anterior medial left lower leg.  Some tenderness.  No deformity.  Some mild tenderness with swelling proximal is also.  Some tenderness with palpation of calf, although this is bilateral.  Warm foot.  No deformity of knee or ankle.  Neurological:     Mental Status: She is alert.     ED Results / Procedures / Treatments   Labs (all labs ordered are listed, but only abnormal results are displayed) Labs Reviewed - No data to display  EKG None  Radiology DG Tibia/Fibula Left  Result Date: 12/29/2019 CLINICAL DATA:  Leg pain EXAM: LEFT TIBIA AND FIBULA - 2 VIEW COMPARISON:  None. FINDINGS: There is no evidence of fracture or other focal bone lesions. Mild soft tissue edema. IMPRESSION: Negative. Electronically Signed   By: Donavan Foil M.D.   On: 12/29/2019 20:15    Procedures Procedures (including critical care time)  Medications Ordered in ED Medications - No data to display  ED Course  I have reviewed the triage vital signs and the nursing notes.  Pertinent labs & imaging results that were available during my care of the patient were reviewed by me and considered in my medical decision making (see chart for details).    MDM  Rules/Calculators/A&P                      Patient with pain in left lower leg.  I think more likely contusion.  X-ray reassuring.  However with  some swelling will get outpatient Doppler tomorrow.  Will not preemptively anticoagulate due to bruising at baseline.  Discharge home.  Doubt pulmonary embolism. Final Clinical Impression(s) / ED Diagnoses Final diagnoses:  Pain of left lower leg    Rx / DC Orders ED Discharge Orders         Ordered    LE VENOUS     12/29/19 2202           Davonna Belling, MD 12/30/19 [phone]

## 2019-12-30 ENCOUNTER — Ambulatory Visit: Payer: Medicare Other | Admitting: Family Medicine

## 2020-01-04 DIAGNOSIS — N3942 Incontinence without sensory awareness: Secondary | ICD-10-CM | POA: Diagnosis not present

## 2020-01-04 DIAGNOSIS — D49511 Neoplasm of unspecified behavior of right kidney: Secondary | ICD-10-CM | POA: Diagnosis not present

## 2020-01-16 ENCOUNTER — Other Ambulatory Visit: Payer: Self-pay | Admitting: Obstetrics & Gynecology

## 2020-01-17 NOTE — Telephone Encounter (Signed)
Medication refill request: nystatin ointment  Last AEX:  09-13-19 SM  Next AEX: not currently scheduled  Last MMG (if hormonal medication request): n/a Refill authorized: Today, please advise.   Medication pended for #30g, 0RF. Please refill if appropriate.

## 2020-01-26 DIAGNOSIS — N390 Urinary tract infection, site not specified: Secondary | ICD-10-CM | POA: Diagnosis not present

## 2020-03-05 DIAGNOSIS — D49511 Neoplasm of unspecified behavior of right kidney: Secondary | ICD-10-CM | POA: Diagnosis not present

## 2020-03-09 DIAGNOSIS — C641 Malignant neoplasm of right kidney, except renal pelvis: Secondary | ICD-10-CM | POA: Diagnosis not present

## 2020-03-09 DIAGNOSIS — M47816 Spondylosis without myelopathy or radiculopathy, lumbar region: Secondary | ICD-10-CM | POA: Diagnosis not present

## 2020-03-09 DIAGNOSIS — D49511 Neoplasm of unspecified behavior of right kidney: Secondary | ICD-10-CM | POA: Diagnosis not present

## 2020-03-09 DIAGNOSIS — I700 Atherosclerosis of aorta: Secondary | ICD-10-CM | POA: Diagnosis not present

## 2020-03-09 DIAGNOSIS — D1771 Benign lipomatous neoplasm of kidney: Secondary | ICD-10-CM | POA: Diagnosis not present

## 2020-03-20 DIAGNOSIS — N3946 Mixed incontinence: Secondary | ICD-10-CM | POA: Diagnosis not present

## 2020-03-20 DIAGNOSIS — N3942 Incontinence without sensory awareness: Secondary | ICD-10-CM | POA: Diagnosis not present

## 2020-03-30 DIAGNOSIS — Z1231 Encounter for screening mammogram for malignant neoplasm of breast: Secondary | ICD-10-CM | POA: Diagnosis not present

## 2020-04-06 ENCOUNTER — Other Ambulatory Visit: Payer: Self-pay | Admitting: Family Medicine

## 2020-04-09 DIAGNOSIS — M47816 Spondylosis without myelopathy or radiculopathy, lumbar region: Secondary | ICD-10-CM | POA: Diagnosis not present

## 2020-04-09 DIAGNOSIS — M545 Low back pain: Secondary | ICD-10-CM | POA: Diagnosis not present

## 2020-04-09 DIAGNOSIS — M5136 Other intervertebral disc degeneration, lumbar region: Secondary | ICD-10-CM | POA: Diagnosis not present

## 2020-04-13 DIAGNOSIS — N3021 Other chronic cystitis with hematuria: Secondary | ICD-10-CM | POA: Diagnosis not present

## 2020-04-13 DIAGNOSIS — D49511 Neoplasm of unspecified behavior of right kidney: Secondary | ICD-10-CM | POA: Diagnosis not present

## 2020-04-13 DIAGNOSIS — N3946 Mixed incontinence: Secondary | ICD-10-CM | POA: Diagnosis not present

## 2020-04-19 ENCOUNTER — Ambulatory Visit: Payer: Medicare HMO

## 2020-04-23 ENCOUNTER — Other Ambulatory Visit: Payer: Self-pay

## 2020-04-23 ENCOUNTER — Ambulatory Visit (INDEPENDENT_AMBULATORY_CARE_PROVIDER_SITE_OTHER): Payer: Medicare HMO

## 2020-04-23 VITALS — Wt 159.9 lb

## 2020-04-23 DIAGNOSIS — Z0000 Encounter for general adult medical examination without abnormal findings: Secondary | ICD-10-CM

## 2020-04-23 NOTE — Patient Instructions (Addendum)
[name] , Thank you for taking time to come for your Medicare Wellness Visit. I appreciate your ongoing commitment to your health goals. Please review the following plan we discussed and let me know if I can assist you in the future.   Screening recommendations/referrals: Colonoscopy: No longer required Mammogram: No longer Requiired Bone Density: Done 03/23/19 Recommended yearly ophthalmology/optometry visit for glaucoma screening and checkup Recommended yearly dental visit for hygiene and checkup  Vaccinations: Influenza vaccine: Up to date Pneumococcal vaccine: Up to date Tdap vaccine: Up to date Shingles vaccine: Completed zoster 08/11/2009 & Shingrix 12/17/16  Advanced directives: Please bring a copy of your health care power of attorney and living will to the office at your convenience.  Conditions/risks identified: Pt may decide to move into a retirement home eventually Next appointment: Follow up in one year for your annual wellness visit    Preventive Care 65 Years and Older, Female Preventive care refers to lifestyle choices and visits with your health care provider that can promote health and wellness. What does preventive care include?  A yearly physical exam. This is also called an annual well check.  Dental exams once or twice a year.  Routine eye exams. Ask your health care provider how often you should have your eyes checked.  Personal lifestyle choices, including:  Daily care of your teeth and gums.  Regular physical activity.  Eating a healthy diet.  Avoiding tobacco and drug use.  Limiting alcohol use.  Practicing safe sex.  Taking low-dose aspirin every day.  Taking vitamin and mineral supplements as recommended by your health care provider. What happens during an annual well check? The services and screenings done by your health care provider during your annual well check will depend on your age, overall health, lifestyle risk factors, and family  history of disease. Counseling  Your health care provider may ask you questions about your:  Alcohol use.  Tobacco use.  Drug use.  Emotional well-being.  Home and relationship well-being.  Sexual activity.  Eating habits.  History of falls.  Memory and ability to understand (cognition).  Work and work Statistician.  Reproductive health. Screening  You may have the following tests or measurements:  Height, weight, and BMI.  Blood pressure.  Lipid and cholesterol levels. These may be checked every 5 years, or more frequently if you are over 60 years old.  Skin check.  Lung cancer screening. You may have this screening every year starting at age 29 if you have a 30-pack-year history of smoking and currently smoke or have quit within the past 15 years.  Fecal occult blood test (FOBT) of the stool. You may have this test every year starting at age 82.  Flexible sigmoidoscopy or colonoscopy. You may have a sigmoidoscopy every 5 years or a colonoscopy every 10 years starting at age 27.  Hepatitis C blood test.  Hepatitis B blood test.  Sexually transmitted disease (STD) testing.  Diabetes screening. This is done by checking your blood sugar (glucose) after you have not eaten for a while (fasting). You may have this done every 1-3 years.  Bone density scan. This is done to screen for osteoporosis. You may have this done starting at age 59.  Mammogram. This may be done every 1-2 years. Talk to your health care provider about how often you should have regular mammograms. Talk with your health care provider about your test results, treatment options, and if necessary, the need for more tests. Vaccines  Your health  care provider may recommend certain vaccines, such as:  Influenza vaccine. This is recommended every year.  Tetanus, diphtheria, and acellular pertussis (Tdap, Td) vaccine. You may need a Td booster every 10 years.  Zoster vaccine. You may need this after  age 55.  Pneumococcal 13-valent conjugate (PCV13) vaccine. One dose is recommended after age 23.  Pneumococcal polysaccharide (PPSV23) vaccine. One dose is recommended after age 55. Talk to your health care provider about which screenings and vaccines you need and how often you need them. This information is not intended to replace advice given to you by your health care provider. Make sure you discuss any questions you have with your health care provider. Document Released: 08/24/2015 Document Revised: 04/16/2016 Document Reviewed: 05/29/2015 Elsevier Interactive Patient Education  2017 [place] Prevention in the Home Falls can cause injuries. They can happen to people of all ages. There are many things you can do to make your home safe and to help prevent falls. What can I do on the outside of my home?  Regularly fix the edges of walkways and driveways and fix any cracks.  Remove anything that might make you trip as you walk through a door, such as a raised step or threshold.  Trim any bushes or trees on the path to your home.  Use bright outdoor lighting.  Clear any walking paths of anything that might make someone trip, such as rocks or tools.  Regularly check to see if handrails are loose or broken. Make sure that both sides of any steps have handrails.  Any raised decks and porches should have guardrails on the edges.  Have any leaves, snow, or ice cleared regularly.  Use sand or salt on walking paths during winter.  Clean up any spills in your garage right away. This includes oil or grease spills. What can I do in the bathroom?  Use night lights.  Install grab bars by the toilet and in the tub and shower. Do not use towel bars as grab bars.  Use non-skid mats or decals in the tub or shower.  If you need to sit down in the shower, use a plastic, non-slip stool.  Keep the floor dry. Clean up any water that spills on the floor as soon as it  happens.  Remove soap buildup in the tub or shower regularly.  Attach bath mats securely with double-sided non-slip rug tape.  Do not have throw rugs and other things on the floor that can make you trip. What can I do in the bedroom?  Use night lights.  Make sure that you have a light by your bed that is easy to reach.  Do not use any sheets or blankets that are too big for your bed. They should not hang down onto the floor.  Have a firm chair that has side arms. You can use this for support while you get dressed.  Do not have throw rugs and other things on the floor that can make you trip. What can I do in the kitchen?  Clean up any spills right away.  Avoid walking on wet floors.  Keep items that you use a lot in easy-to-reach places.  If you need to reach something above you, use a strong step stool that has a grab bar.  Keep electrical cords out of the way.  Do not use floor polish or wax that makes floors slippery. If you must use wax, use non-skid floor wax.  Do not have  throw rugs and other things on the floor that can make you trip. What can I do with my stairs?  Do not leave any items on the stairs.  Make sure that there are handrails on both sides of the stairs and use them. Fix handrails that are broken or loose. Make sure that handrails are as long as the stairways.  Check any carpeting to make sure that it is firmly attached to the stairs. Fix any carpet that is loose or worn.  Avoid having throw rugs at the top or bottom of the stairs. If you do have throw rugs, attach them to the floor with carpet tape.  Make sure that you have a light switch at the top of the stairs and the bottom of the stairs. If you do not have them, ask someone to add them for you. What else can I do to help prevent falls?  Wear shoes that:  Do not have high heels.  Have rubber bottoms.  Are comfortable and fit you well.  Are closed at the toe. Do not wear sandals.  If you  use a stepladder:  Make sure that it is fully opened. Do not climb a closed stepladder.  Make sure that both sides of the stepladder are locked into place.  Ask someone to hold it for you, if possible.  Clearly mark and make sure that you can see:  Any grab bars or handrails.  First and last steps.  Where the edge of each step is.  Use tools that help you move around (mobility aids) if they are needed. These include:  Canes.  Walkers.  Scooters.  Crutches.  Turn on the lights when you go into a dark area. Replace any light bulbs as soon as they burn out.  Set up your furniture so you have a clear path. Avoid moving your furniture around.  If any of your floors are uneven, fix them.  If there are any pets around you, be aware of where they are.  Review your medicines with your doctor. Some medicines can make you feel dizzy. This can increase your chance of falling. Ask your doctor what other things that you can do to help prevent falls. This information is not intended to replace advice given to you by your health care provider. Make sure you discuss any questions you have with your health care provider. Document Released: 05/24/2009 Document Revised: 01/03/2016 Document Reviewed: 09/01/2014 Elsevier Interactive Patient Education  2017 [employer].

## 2020-04-23 NOTE — Progress Notes (Signed)
Virtual Visit via Telephone Note  I connected with  [name] on 04/23/20 at 11:45 AM EDT by telephone and verified that I am speaking with the correct person using two identifiers.  Medicare Annual Wellness visit completed telephonically due to Covid-19 pandemic.   Persons participating in this call: This Health Coach and this patient.  Location: Patient: Home Provider: Office   I discussed the limitations, risks, security and privacy concerns of performing an evaluation and management service by telephone and the availability of in person appointments. The patient expressed understanding and agreed to proceed.  Unable to perform video visit due to video visit attempted and failed and/or patient does not have video capability.   Some vital signs may be absent or patient reported.   Willette Brace, LPN    Subjective:   [name] is a 84 y.o. female who presents for Medicare Annual (Subsequent) preventive examination.  Review of Systems     Cardiac Risk Factors include: advanced age (>78men, >45 women);dyslipidemia;hypertension     Objective:    Today's Vitals   04/23/20 1101  Weight: 159 lb 14.4 oz (72.5 kg)   Body mass index is 28.33 kg/m.  Advanced Directives 04/23/2020 12/29/2019 04/04/2019 04/01/2018 01/24/2018 01/24/2018 12/07/2017  Does Patient Have a Medical Advance Directive? Yes No Yes Yes Yes Yes Yes  Type of Advance Directive Living will;Healthcare Power of Attorney - Living will;Healthcare Power of [place];Living will [place];Living will  Does patient want to make changes to medical advance directive? - - No - Patient declined - No - Patient declined - -  Copy of [place] in Chart? No - copy requested - No - copy requested - - - -  Would patient like information on creating a medical advance directive? - No - Patient declined - - - - -    Current Medications  (verified) Outpatient Encounter Medications as of 04/23/2020  Medication Sig  . acetaminophen (TYLENOL) 500 MG tablet Take 500 mg by mouth daily as needed for mild pain or headache.   Marland Kitchen aspirin 81 MG tablet Take 81 mg by mouth daily.  . brimonidine-timolol (COMBIGAN) 0.2-0.5 % ophthalmic solution Place 1 drop into both eyes every 12 (twelve) hours.  . cephALEXin (KEFLEX) 500 MG capsule Take 500 mg by mouth 4 (four) times daily.  . clobetasol ointment (TEMOVATE) 0.05 % Apply thin layer every two to three days  . ezetimibe-simvastatin (VYTORIN) 10-20 MG tablet Take 1 tablet by mouth daily.  . nadolol (CORGARD) 40 MG tablet TAKE 1 TABLET(40 MG) BY MOUTH AT BEDTIME  . nystatin ointment (MYCOSTATIN) APPLY TO THE AFFECTED AREA 1 TO 2 TIMES A DAY. USE FOR UP TO 10 DAYS  . omeprazole (PRILOSEC) 40 MG capsule TAKE 1 CAPSULE(40 MG) BY MOUTH DAILY (Patient taking differently: Take 40 mg by mouth daily. )  . sodium chloride (OCEAN) 0.65 % SOLN nasal spray Place 1 spray into both nostrils 2 (two) times daily as needed for congestion.  Marland Kitchen tiZANidine (ZANAFLEX) 2 MG tablet Take by mouth every 6 (six) hours as needed for muscle spasms.  . traZODone (DESYREL) 100 MG tablet TAKE 1/4 TO 1/2 TABLET BY MOUTH AT BEDTIME IF NEEDED FOR SLEEP (Patient taking differently: Take 25 mg by mouth at bedtime. )  . venlafaxine XR (EFFEXOR-XR) 150 MG 24 hr capsule TAKE 1 CAPSULE(150 MG) BY MOUTH TWICE DAILY  . ALPRAZolam (XANAX) 0.5 MG tablet TAKE 1/2 TO 1  TABLET(0.25 TO 0.5 MG) BY MOUTH TWICE DAILY AS NEEDED FOR ANXIETY (Patient not taking: Reported on 04/23/2020)  . clotrimazole-betamethasone (LOTRISONE) cream APPLY 1 APPLICATION TOPICALLY TWICE DAILY FOR UPTO 10 DAYS (Patient not taking: Reported on 12/29/2019)  . nitrofurantoin, macrocrystal-monohydrate, (MACROBID) 100 MG capsule Take 1 capsule (100 mg total) by mouth 2 (two) times daily. (Patient not taking: Reported on 12/29/2019)  . nitrofurantoin, macrocrystal-monohydrate,  (MACROBID) 100 MG capsule Take 1 capsule (100 mg total) by mouth 2 (two) times daily. (Patient not taking: Reported on 12/29/2019)  . sulfamethoxazole-trimethoprim (BACTRIM DS) 800-160 MG tablet Take 1 tablet by mouth 2 (two) times daily. (Patient not taking: Reported on 12/29/2019)  . trimethoprim (TRIMPEX) 100 MG tablet Take 1 tablet (100 mg total) by mouth daily. (Patient not taking: Reported on 12/29/2019)   No facility-administered encounter medications on file as of 04/23/2020.    Allergies (verified) Citrate of magnesia, Codeine, and Shingrix [zoster vac recomb adjuvanted]   History: Past Medical History:  Diagnosis Date  . Anxiety   . Colon polyps   . Complication of anesthesia    takes longer to wake up- admitted overnight after colonoscopy  . Depression   . GERD (gastroesophageal reflux disease)   . Glaucoma   . Headache    migraines  . Hx: UTI (urinary tract infection)   . Hypercholesteremia   . Hypertension   . Lichen simplex chronicus   . Lumbar stenosis with neurogenic claudication   . Macular degeneration   . Osteoarthritis   . Osteopenia    of the hip  . PONV (postoperative nausea and vomiting)   . Stress incontinence, female   . Unsteady gait   . Wears glasses   . Whooping cough    as a baby   Past Surgical History:  Procedure Laterality Date  . ABDOMINAL HYSTERECTOMY  1977   ovaries remain  . ANTERIOR LAT LUMBAR FUSION N/A 10/26/2017   Procedure: LUMBAR THREE- LUMBAR FOUR, LUMBAR FOUR- LUMBAR FIVE ANTEROLATERAL LUMBAR INTERBODY ARTHRODESIS;  Surgeon: Jovita Gamma, MD;  Location: [facility];  Service: Neurosurgery;  Laterality: N/A;  LUMBAR 3- LUMBAR 4, LUMBAR 4- LUMBAR 5 ANTEROLATERAL LUMBAR INTERBODY ARTHRODESIS, LUMBAR 3- LUMBAR 4, LUMBAR 4- LUMBAR 5 PERCUTANEOUS PEDICLE SCREW FIXATION  . APPENDECTOMY    . BLADDER REPAIR     x two  . CHOLECYSTECTOMY    . COLONOSCOPY    . EYE SURGERY  11/14, 12/14   Macular Dengeneration, Glaucoma  . EYE SURGERY  2016    left eye  . LESION REMOVAL Right 06/28/2013   Procedure: EXCISION 3 cm right labial sebaceous cyst;  Surgeon: Lyman Speller, MD;  Location: [facility];  Service: Gynecology;  Laterality: Right;  . LUMBAR LAMINECTOMY/DECOMPRESSION MICRODISCECTOMY Right 03/20/2016   Procedure: Right - Lumbar four-five lumbar laminotomy, foraminotomy, and possible microdiscectomy;  Surgeon: Jovita Gamma, MD;  Location: [facility];  Service: Neurosurgery;  Laterality: Right;  right  . LUMBAR PERCUTANEOUS PEDICLE SCREW 2 LEVEL  10/26/2017   Procedure: LUMBAR THREE- LUMBAR FOUR, LUMBAR FOUR- LUMBAR FIVE PERCUTANEOUS PEDICLE SCREW FIXATION;  Surgeon: Jovita Gamma, MD;  Location: [facility];  Service: Neurosurgery;;  . MOHS SURGERY     procedure to remove basal cell  . TONSILLECTOMY AND ADENOIDECTOMY    . URETHRAL SLING  2007  . URETHRAL SLING  1/12   midurethral   . vertebroplasty secondary to traumatic compression fracture     Family History  Problem Relation Age of Onset  . Congestive Heart Failure  Mother   . Thyroid disease Mother   . Osteoporosis Mother   . Alcoholism Mother   . Prostate cancer Father   . Breast cancer Maternal Grandmother   . Hypertension Sister   . Thyroid disease Sister   . Cancer Sister        brain ?  Marland Kitchen Rheum arthritis Daughter    Social History   Socioeconomic History  . Marital status: Married    Spouse name: Not on file  . Number of children: 2  . Years of education: [employer]  . Highest education level: Not on file  Occupational History  . Occupation: Retired  Tobacco Use  . Smoking status: Former Smoker    Quit date: 08/11/1965    Years since quitting: 54.7  . Smokeless tobacco: Never Used  . Tobacco comment: has not smoked in 45 years   Vaping Use  . Vaping Use: Never used  Substance and Sexual Activity  . Alcohol use: Not Currently    Alcohol/week: 1.0 standard drink    Types: 1 Standard drinks or equivalent per week    Comment: occ   . Drug use: No   . Sexual activity: Not Currently    Partners: Male    Birth control/protection: Surgical    Comment: TAH  Other Topics Concern  . Not on file  Social History Narrative   HSG, attended [employer]   Married '65   1 son '67, 1 Daughter '69; 5 grandchildren. 1 grandson dealing with drugs after divorce of parents. Oldest.    Work: retired Pharmacist, hospital   Marriage in good health   Former smoker   Right-handed   Caffeine: 11/2 cups per day   Social Determinants of Health   Financial Resource Strain: Low Risk   . Difficulty of Paying Living Expenses: Not hard at all  Food Insecurity: No Food Insecurity  . Worried About Charity fundraiser in the Last Year: Never true  . Ran Out of Food in the Last Year: Never true  Transportation Needs: No Transportation Needs  . Lack of Transportation (Medical): No  . Lack of Transportation (Non-Medical): No  Physical Activity: Inactive  . Days of Exercise per Week: 0 days  . Minutes of Exercise per Session: 0 min  Stress: No Stress Concern Present  . Feeling of Stress : Only a little  Social Connections: Moderately Integrated  . Frequency of Communication with Friends and Family: More than three times a week  . Frequency of Social Gatherings with Friends and Family: Three times a week  . Attends Religious Services: More than 4 times per year  . Active Member of Clubs or Organizations: No  . Attends Archivist Meetings: Never  . Marital Status: Married    Tobacco Counseling Counseling given: Not Answered Comment: has not smoked in 45 years    Clinical Intake:  Pre-visit preparation completed: Yes  Pain : No/denies pain     BMI - recorded: 28.33 Nutritional Status: BMI 25 -29 Overweight Nutritional Risks: None Diabetes: No  How often do you need to have someone help you when you read instructions, pamphlets, or other written materials from your doctor or pharmacy?: 1 - Never  Diabetic?no  Interpreter Needed?:  No  Information entered by :: Charlott Rakes, LPN   Activities of Daily Living In your present state of health, do you have any difficulty performing the following activities: 04/23/2020  Hearing? N  Vision? N  Difficulty concentrating or making decisions? N  Walking or climbing stairs? N  Dressing or bathing? N  Doing errands, shopping? N  Preparing Food and eating ? N  Using the Toilet? N  In the past six months, have you accidently leaked urine? Y  Comment wears briefs  Do you have problems with loss of bowel control? N  Managing your Medications? N  Managing your Finances? N  Housekeeping or managing your Housekeeping? N  Some recent data might be hidden    Patient Care Team: Marin Olp, MD as PCP - General (Family Medicine) Sherren Mocha, MD as PCP - Cardiology (Cardiology)  Indicate any recent Medical Services you may have received from other than [facility] providers in the past year (date may be approximate).     Assessment:   This is a routine wellness examination for [name].  Hearing/Vision screen  Hearing Screening   125Hz  250Hz  500Hz  1000Hz  2000Hz  3000Hz  4000Hz  6000Hz  8000Hz   Right ear:           Left ear:           Comments: Pt denies any difficulty hearing  Vision Screening Comments: Dr Celene Squibb follows up annually for eye exams  Dietary issues and exercise activities discussed: Current Exercise Habits: The patient does not participate in regular exercise at present  Goals    . Patient Stated     To be able to walk again. Eat well; eat fruit and vegetables and get enough nutrition to heal     . Patient Stated     Moving to a retirement home    . weight     Please get a good variety of colorful food   Watches her portions; eating less   Fat free or low fat dairy products Fish high in omega-3 acids ( salmon, tuna, trout) Fruits, such as apples, bananas, oranges, pears, prunes Legumes, such as kidney beans, lentils, checkpeas, black-eyed peas  and lima beans Vegetables; broccoli, cabbage, carrots Whole grains;   Plant fats are better; decrease "white" foods as pasta, rice, bread and desserts, sugar; Avoid red meat (limiting) palm and coconut oils; sugary foods and beverages  Two nutrients that raise blood chol levels are saturated fats and trans fat; in hydrogenated oils and fats, as stick margarine, baked goods (cookes, cakes, pies, crackers; frosting; and coffee creamers;   Some Fats lower cholesterol: Monounsaturated and polyunsaturated  Avocados Corn, sunflower, and soybean oils Nuts and seeds, such as walnuts Olive, canola, peanut, safflower, and sesame oils Peanut butter Salmon and trout Tofu         Depression Screen PHQ 2/9 Scores 04/23/2020 04/04/2019 12/30/2018 04/01/2018 02/17/2018 03/25/2017 03/06/2017  PHQ - 2 Score 0 0 0 0 0 0 0  PHQ- 9 Score - 0 1 - 0 - -    Fall Risk Fall Risk  04/23/2020 04/04/2019 04/01/2018 03/25/2017 03/06/2017  Falls in the past year? 0 0 No Yes Yes  Number falls in past yr: 0 0 - 2 or more 2 or more  Injury with Fall? 0 0 - - No  Risk for fall due to : Impaired vision;Impaired balance/gait - - - -  Follow up Falls prevention discussed Education provided - Education provided -    Any stairs in or around the home? Yes  If so, are there any without handrails? No  Home free of loose throw rugs in walkways, pet beds, electrical cords, etc? Yes  Adequate lighting in your home to reduce risk of falls? Yes   ASSISTIVE DEVICES UTILIZED TO  PREVENT FALLS:  Life alert? No Use of a cane, walker or w/c? No  Grab bars in the bathroom? No  Shower chair or bench in shower? Yes  Elevated toilet seat or a handicapped toilet? No   TIMED UP AND GO:  Was the test performed? No .  Cognitive Function: MMSE - Mini Mental State Exam 04/01/2018 03/25/2017  Not completed: (No Data) -  Orientation to time - 5  Orientation to Place - 5  Registration - 3  Attention/ Calculation - 5  Recall - 3  Language-  name 2 objects - 2  Language- follow 3 step command - 3  Language- read & follow direction - 1  Write a sentence - 1  Copy design - 1     6CIT Screen 04/23/2020 04/04/2019  What Year? 0 points 0 points  What month? 0 points 0 points  What time? - 0 points  Count back from 20 0 points 0 points  Months in reverse 0 points 0 points  Repeat phrase 0 points 0 points  Total Score - 0    Immunizations Immunization History  Administered Date(s) Administered  . Fluad Quad(high Dose 65+) 05/26/2019  . Influenza Whole 07/18/2004, 04/25/2010, 04/28/2010  . Influenza, High Dose Seasonal PF 08/26/2013, 04/19/2014, 04/04/2016  . Influenza-Unspecified 05/11/2012, 04/03/2015, 04/13/2017  . PFIZER SARS-COV-2 Vaccination 09/09/2019, 09/30/2019  . Pneumococcal Conjugate-13 10/08/2015  . Pneumococcal Polysaccharide-23 08/11/2006  . Td 03/09/2008  . Tdap 04/03/2015  . Zoster 08/11/2009  . Zoster Recombinat (Shingrix) 12/17/2016    TDAP status: Up to date Flu Vaccine status: Up to date Pneumococcal vaccine status: Up to date Covid-19 vaccine status: Completed vaccines  Qualifies for Shingles Vaccine? Yes   Zostavax completed Yes   Shingrix Completed?: Yes  Screening Tests Health Maintenance  Topic Date Due  . INFLUENZA VACCINE  03/11/2020  . TETANUS/TDAP  04/02/2025  . DEXA SCAN  Completed  . COVID-19 Vaccine  Completed  . PNA vac Low Risk Adult  Completed    Health Maintenance  Health Maintenance Due  Topic Date Due  . INFLUENZA VACCINE  03/11/2020    Colorectal cancer screening: No longer required.  Mammogram status: No longer required.  Bone Density status: Completed 03/23/19. Results reflect: Bone density results: OSTEOPENIA. Repeat every 2 years.  Additional Screening:   Vision Screening: Recommended annual ophthalmology exams for early detection of glaucoma and other disorders of the eye. Is the patient up to date with their annual eye exam?  Yes  Who is the provider  or what is the name of the office in which the patient attends annual eye exams? Dr Celene Squibb   Dental Screening: Recommended annual dental exams for proper oral hygiene  Community Resource Referral / Chronic Care Management: CRR required this visit?  No   CCM required this visit?  No      Plan:     I have personally reviewed and noted the following in the patient's chart:   . Medical and social history . Use of alcohol, tobacco or illicit drugs  . Current medications and supplements . Functional ability and status . Nutritional status . Physical activity . Advanced directives . List of other physicians . Hospitalizations, surgeries, and ER visits in previous 12 months . Vitals . Screenings to include cognitive, depression, and falls . Referrals and appointments  In addition, I have reviewed and discussed with patient certain preventive protocols, quality metrics, and best practice recommendations. A written personalized care plan for preventive services as well  as general preventive health recommendations were provided to patient.     Willette Brace, LPN   [id number]   Nurse Notes: None

## 2020-05-03 ENCOUNTER — Ambulatory Visit: Payer: Medicare HMO | Admitting: Physician Assistant

## 2020-05-03 ENCOUNTER — Other Ambulatory Visit: Payer: Self-pay

## 2020-05-03 ENCOUNTER — Encounter: Payer: Self-pay | Admitting: Physician Assistant

## 2020-05-03 ENCOUNTER — Telehealth: Payer: Self-pay | Admitting: Physician Assistant

## 2020-05-03 VITALS — BP 132/62 | HR 63 | Ht 63.0 in | Wt 160.8 lb

## 2020-05-03 DIAGNOSIS — E782 Mixed hyperlipidemia: Secondary | ICD-10-CM

## 2020-05-03 DIAGNOSIS — R0600 Dyspnea, unspecified: Secondary | ICD-10-CM | POA: Diagnosis not present

## 2020-05-03 DIAGNOSIS — I10 Essential (primary) hypertension: Secondary | ICD-10-CM

## 2020-05-03 DIAGNOSIS — R0789 Other chest pain: Secondary | ICD-10-CM

## 2020-05-03 DIAGNOSIS — R0609 Other forms of dyspnea: Secondary | ICD-10-CM

## 2020-05-03 MED ORDER — NITROGLYCERIN 0.4 MG SL SUBL: 0.4000 mg | SUBLINGUAL_TABLET | SUBLINGUAL | 3 refills | Status: DC | PRN

## 2020-05-03 NOTE — Progress Notes (Signed)
Cardiology Office Note    Date:  05/03/2020   ID:  Gabrielle, Booker 1936-03-17, MRN [id number]  PCP:  Marin Olp, MD  Cardiologist:  Dr. Burt Knack  Chief Complaint: 24 Months follow up  History of Present Illness:   [name] is a 84 y.o. female with history of hypertension, hyperlipidemia and chronic back pain seen for long due follow-up.  Admitted June 2019 with chest pain radiating to her jaw.  Troponin negative.  Echo with LV function of 60 to 65%, no wall motion abnormalities, and grade 1 diastolic dysfunction.  Nuclear stress test showed LV function of 74%.  This was low risk study. There was a medium defect of moderate severity in the basal inferior lateral and anterior septal mid inferolateral and apical lateral location most likely attenuation artifact.  Last seen by Estella Husk PA-C July 2019.  Requested to follow-up with Dr. Burt Knack as he sees her husband.  Patient is presented for long due follow-up with her husband.  She reported left-sided chest "discomfort" with occasional radiation to left jaw however this only happens under "anxiety/stress".  She has dyspnea while walking on stair.  No chest discomfort at that time.  Her symptoms last for few minutes and resolve spontaneously.  She walks during shopping and does house hold chores without shortness of breath or chest discomfort.  Denies palpitation, orthopnea, PND, syncope, lower extremity edema or melena.   Past Medical History:  Diagnosis Date  . Anxiety   . Colon polyps   . Complication of anesthesia    takes longer to wake up- admitted overnight after colonoscopy  . Depression   . GERD (gastroesophageal reflux disease)   . Glaucoma   . Headache    migraines  . Hx: UTI (urinary tract infection)   . Hypercholesteremia   . Hypertension   . Lichen simplex chronicus   . Lumbar stenosis with neurogenic claudication   . Macular degeneration   . Osteoarthritis   . Osteopenia    of the hip  .  PONV (postoperative nausea and vomiting)   . Stress incontinence, female   . Unsteady gait   . Wears glasses   . Whooping cough    as a baby    Past Surgical History:  Procedure Laterality Date  . ABDOMINAL HYSTERECTOMY  1977   ovaries remain  . ANTERIOR LAT LUMBAR FUSION N/A 10/26/2017   Procedure: LUMBAR THREE- LUMBAR FOUR, LUMBAR FOUR- LUMBAR FIVE ANTEROLATERAL LUMBAR INTERBODY ARTHRODESIS;  Surgeon: Jovita Gamma, MD;  Location: [facility];  Service: Neurosurgery;  Laterality: N/A;  LUMBAR 3- LUMBAR 4, LUMBAR 4- LUMBAR 5 ANTEROLATERAL LUMBAR INTERBODY ARTHRODESIS, LUMBAR 3- LUMBAR 4, LUMBAR 4- LUMBAR 5 PERCUTANEOUS PEDICLE SCREW FIXATION  . APPENDECTOMY    . BLADDER REPAIR     x two  . CHOLECYSTECTOMY    . COLONOSCOPY    . EYE SURGERY  11/14, 12/14   Macular Dengeneration, Glaucoma  . EYE SURGERY  2016   left eye  . LESION REMOVAL Right 06/28/2013   Procedure: EXCISION 3 cm right labial sebaceous cyst;  Surgeon: Lyman Speller, MD;  Location: [facility];  Service: Gynecology;  Laterality: Right;  . LUMBAR LAMINECTOMY/DECOMPRESSION MICRODISCECTOMY Right 03/20/2016   Procedure: Right - Lumbar four-five lumbar laminotomy, foraminotomy, and possible microdiscectomy;  Surgeon: Jovita Gamma, MD;  Location: [facility];  Service: Neurosurgery;  Laterality: Right;  right  . LUMBAR PERCUTANEOUS PEDICLE SCREW 2 LEVEL  10/26/2017   Procedure: LUMBAR THREE- LUMBAR  FOUR, LUMBAR FOUR- LUMBAR FIVE PERCUTANEOUS PEDICLE SCREW FIXATION;  Surgeon: Jovita Gamma, MD;  Location: [facility];  Service: Neurosurgery;;  . MOHS SURGERY     procedure to remove basal cell  . TONSILLECTOMY AND ADENOIDECTOMY    . URETHRAL SLING  2007  . URETHRAL SLING  1/12   midurethral   . vertebroplasty secondary to traumatic compression fracture      Current Medications:  Prior to Admission medications   Medication Sig Start Date End Date Taking? Authorizing Provider  acetaminophen (TYLENOL) 500 MG tablet Take 500  mg by mouth daily as needed for mild pain or headache.    Yes [provider]  ALPRAZolam (XANAX) 0.5 MG tablet TAKE 1/2 TO 1 TABLET(0.25 TO 0.5 MG) BY MOUTH TWICE DAILY AS NEEDED FOR ANXIETY 07/05/18  Yes Marin Olp, MD  aspirin 81 MG tablet Take 81 mg by mouth daily.   Yes [provider]  brimonidine-timolol (COMBIGAN) 0.2-0.5 % ophthalmic solution Place 1 drop into both eyes every 12 (twelve) hours.   Yes [provider]  clobetasol ointment (TEMOVATE) 0.05 % Apply thin layer every two to three days 09/01/19  Yes Megan Salon, MD  ezetimibe-simvastatin (VYTORIN) 10-20 MG tablet Take 1 tablet by mouth daily. 10/13/19  Yes Marin Olp, MD  nadolol (CORGARD) 40 MG tablet TAKE 1 TABLET(40 MG) BY MOUTH AT BEDTIME 10/13/19  Yes Marin Olp, MD  nystatin ointment (MYCOSTATIN) APPLY TO THE AFFECTED AREA 1 TO 2 TIMES A DAY. USE FOR UP TO 10 DAYS 01/18/20  Yes Megan Salon, MD  omeprazole (PRILOSEC) 40 MG capsule TAKE 1 CAPSULE(40 MG) BY MOUTH DAILY 05/30/19  Yes Marin Olp, MD  sodium chloride (OCEAN) 0.65 % SOLN nasal spray Place 1 spray into both nostrils 2 (two) times daily as needed for congestion.   Yes [provider]  tiZANidine (ZANAFLEX) 2 MG tablet Take by mouth every 6 (six) hours as needed for muscle spasms.   Yes [provider]  traZODone (DESYREL) 100 MG tablet TAKE 1/4 TO 1/2 TABLET BY MOUTH AT BEDTIME IF NEEDED FOR SLEEP 01/12/19  Yes Marin Olp, MD  venlafaxine XR (EFFEXOR-XR) 150 MG 24 hr capsule TAKE 1 CAPSULE(150 MG) BY MOUTH TWICE DAILY 04/06/20  Yes Marin Olp, MD  cephALEXin (KEFLEX) 500 MG capsule Take 500 mg by mouth 4 (four) times daily. Patient not taking: Reported on 05/03/2020    [provider]  clotrimazole-betamethasone (LOTRISONE) cream APPLY 1 APPLICATION TOPICALLY TWICE DAILY FOR UPTO 10 DAYS Patient not taking: Reported on 12/29/2019 01/26/19   Marin Olp, MD  nitrofurantoin,  macrocrystal-monohydrate, (MACROBID) 100 MG capsule Take 1 capsule (100 mg total) by mouth 2 (two) times daily. Patient not taking: Reported on 12/29/2019 10/28/19   Megan Salon, MD  nitrofurantoin, macrocrystal-monohydrate, (MACROBID) 100 MG capsule Take 1 capsule (100 mg total) by mouth 2 (two) times daily. Patient not taking: Reported on 12/29/2019 11/14/19   Megan Salon, MD  sulfamethoxazole-trimethoprim (BACTRIM DS) 800-160 MG tablet Take 1 tablet by mouth 2 (two) times daily. Patient not taking: Reported on 12/29/2019 10/07/19   Megan Salon, MD  trimethoprim (TRIMPEX) 100 MG tablet Take 1 tablet (100 mg total) by mouth daily. Patient not taking: Reported on 12/29/2019 11/23/19   Megan Salon, MD   Allergies:   Citrate of magnesia, Codeine, and Shingrix [zoster vac recomb adjuvanted]   Social History   Socioeconomic History  . Marital status: Married  Spouse name: Not on file  . Number of children: 2  . Years of education: [employer]  . Highest education level: Not on file  Occupational History  . Occupation: Retired  Tobacco Use  . Smoking status: Former Smoker    Quit date: 08/11/1965    Years since quitting: 54.7  . Smokeless tobacco: Never Used  . Tobacco comment: has not smoked in 45 years   Vaping Use  . Vaping Use: Never used  Substance and Sexual Activity  . Alcohol use: Not Currently    Alcohol/week: 1.0 standard drink    Types: 1 Standard drinks or equivalent per week    Comment: occ   . Drug use: No  . Sexual activity: Not Currently    Partners: Male    Birth control/protection: Surgical    Comment: TAH  Other Topics Concern  . Not on file  Social History Narrative   HSG, attended [employer]   Married '65   1 son '67, 1 Daughter '69; 5 grandchildren. 1 grandson dealing with drugs after divorce of parents. Oldest.    Work: retired Pharmacist, hospital   Marriage in good health   Former smoker   Right-handed   Caffeine: 11/2 cups per day   Social  Determinants of Health   Financial Resource Strain: Low Risk   . Difficulty of Paying Living Expenses: Not hard at all  Food Insecurity: No Food Insecurity  . Worried About Charity fundraiser in the Last Year: Never true  . Ran Out of Food in the Last Year: Never true  Transportation Needs: No Transportation Needs  . Lack of Transportation (Medical): No  . Lack of Transportation (Non-Medical): No  Physical Activity: Inactive  . Days of Exercise per Week: 0 days  . Minutes of Exercise per Session: 0 min  Stress: No Stress Concern Present  . Feeling of Stress : Only a little  Social Connections: Moderately Integrated  . Frequency of Communication with Friends and Family: More than three times a week  . Frequency of Social Gatherings with Friends and Family: Three times a week  . Attends Religious Services: More than 4 times per year  . Active Member of Clubs or Organizations: No  . Attends Archivist Meetings: Never  . Marital Status: Married     Family History:  The patient's family history includes Alcoholism in her mother; Breast cancer in her maternal grandmother; Cancer in her sister; Congestive Heart Failure in her mother; Hypertension in her sister; Osteoporosis in her mother; Prostate cancer in her father; Rheum arthritis in her daughter; Thyroid disease in her mother and sister.   ROS:   Please see the history of present illness.    ROS All other systems reviewed and are negative.   PHYSICAL EXAM:   VS:  BP 132/62   Pulse 63   Ht 5\' 3"  (1.6 m)   Wt 160 lb 12.8 oz (72.9 kg)   LMP 08/11/1974   SpO2 98%   BMI 28.48 kg/m    GEN: Well nourished, well developed, in no acute distress  HEENT: normal  Neck: no JVD, carotid bruits, or masses Cardiac: *RRR; no murmurs, rubs, or gallops,no edema  Respiratory:  clear to auscultation bilaterally, normal work of breathing GI: soft, nontender, nondistended, + BS MS: no deformity or atrophy  Skin: warm and dry, no  rash Neuro:  Alert and Oriented x 3, Strength and sensation are intact Psych: euthymic mood, full affect  Wt Readings from Last  3 Encounters:  05/03/20 160 lb 12.8 oz (72.9 kg)  04/23/20 159 lb 14.4 oz (72.5 kg)  10/07/19 159 lb 9.6 oz (72.4 kg)      Studies/Labs Reviewed:   EKG:  EKG is ordered today.  The ekg ordered today demonstrates normal sinus rhythm at rate of 63 bpm, PVC  Recent Labs: No results found for requested labs within last 8760 hours.   Lipid Panel    Component Value Date/Time   CHOL 144 01/25/2018 0432   TRIG 125 01/25/2018 0432   HDL 41 01/25/2018 0432   CHOLHDL 3.5 01/25/2018 0432   VLDL 25 01/25/2018 0432   LDLCALC 78 01/25/2018 0432   LDLDIRECT 84.0 03/06/2017 1402    Additional studies/ records that were reviewed today include:   As summarized above   ASSESSMENT & PLAN:    1. Chest discomfort/dyspnea on exertion Her symptoms mostly sounds atypical and related to deconditioning.  Chest discomfort occurs only during stress/anxiety.  Dyspnea only occurs with walking on stairs.  After long discussion with patient and husband.  Decided to manage conservatively.  She will increase her exercise.  As needed nitroglycerin use.  She will give [place] call if worsening symptoms.  Continue aspirin and Vytorin.  2.  Hypertension -Blood pressure stable.  No change.  3.  Hyperlipidemia -Followed by PCP.  She will schedule lab work with PCP.  No change.    Medication Adjustments/Labs and Tests Ordered: Current medicines are reviewed at length with the patient today.  Concerns regarding medicines are outlined above.  Medication changes, Labs and Tests ordered today are listed in the Patient Instructions below. Patient Instructions  Medication Instructions:  Your physician recommends that you continue on your current medications as directed. Please refer to the Current Medication list given to you today., but  We did send in a prescription for Nitroglycerin 0.4  s/l tablet to use ONLY AS NEEDED for chest pain  *If you need a refill on your cardiac medications before your next appointment, please call your pharmacy*   Lab Work: None ordered  If you have labs (blood work) drawn today and your tests are completely normal, you will receive your results only by: Marland Kitchen MyChart Message (if you have MyChart) OR . A paper copy in the mail If you have any lab test that is abnormal or we need to change your treatment, we will call you to review the results.   Testing/Procedures: None ordered   Follow-Up: At [facility], you and your health needs are our priority.  As part of our continuing mission to provide you with exceptional heart care, we have created designated Provider Care Teams.  These Care Teams include your primary Cardiologist (physician) and Advanced Practice Providers (APPs -  Physician Assistants and Nurse Practitioners) who all work together to provide you with the care you need, when you need it.  We recommend signing up for the patient portal called "MyChart".  Sign up information is provided on this After Visit Summary.  MyChart is used to connect with patients for Virtual Visits (Telemedicine).  Patients are able to view lab/test results, encounter notes, upcoming appointments, etc.  Non-urgent messages can be sent to your provider as well.   To learn more about what you can do with MyChart, go to [email or web address].    Your next appointment:   3-4 month(s)  The format for your next appointment:   In Person  Provider:   You may see Sherren Mocha, MD or one of  the following Advanced Practice Providers on your designated Care Team:    Richardson Dopp, PA-C  Robbie Lis, PA-C    Other Instructions      Jarrett Soho, [place]  05/03/2020 11:51 AM    [address], [place], [place]  [address] Phone: [phone]; Fax: [phone]

## 2020-05-03 NOTE — Telephone Encounter (Signed)
Returned call to pt's husband, DPR on file.  He was advised that in the office visit today, [name] was asking when [name] would see her PCP, that she was overdue to have her Lipid's checked.   He was thankful for me calling to clear up the confusion.  He will call and make an appt with PCP to have them checked.

## 2020-05-03 NOTE — Telephone Encounter (Signed)
Per pt's husband pt's lab orders are not yet in the system they were told she needs to have some done and that this can be done at the PCP's office.  Please give them a call to confirm this and when done.   They stated thanks.

## 2020-05-03 NOTE — Patient Instructions (Addendum)
Medication Instructions:  Your physician recommends that you continue on your current medications as directed. Please refer to the Current Medication list given to you today., but  We did send in a prescription for Nitroglycerin 0.4 s/l tablet to use ONLY AS NEEDED for chest pain  *If you need a refill on your cardiac medications before your next appointment, please call your pharmacy*   Lab Work: None ordered  If you have labs (blood work) drawn today and your tests are completely normal, you will receive your results only by: Marland Kitchen MyChart Message (if you have MyChart) OR . A paper copy in the mail If you have any lab test that is abnormal or we need to change your treatment, we will call you to review the results.   Testing/Procedures: None ordered   Follow-Up: At [facility], you and your health needs are our priority.  As part of our continuing mission to provide you with exceptional heart care, we have created designated Provider Care Teams.  These Care Teams include your primary Cardiologist (physician) and Advanced Practice Providers (APPs -  Physician Assistants and Nurse Practitioners) who all work together to provide you with the care you need, when you need it.  We recommend signing up for the patient portal called "MyChart".  Sign up information is provided on this After Visit Summary.  MyChart is used to connect with patients for Virtual Visits (Telemedicine).  Patients are able to view lab/test results, encounter notes, upcoming appointments, etc.  Non-urgent messages can be sent to your provider as well.   To learn more about what you can do with MyChart, go to [email or web address].    Your next appointment:   3-4 month(s)  The format for your next appointment:   In Person  Provider:   You may see Sherren Mocha, MD or one of the following Advanced Practice Providers on your designated Care Team:    Richardson Dopp, PA-C  Robbie Lis, [place]    Other  Instructions

## 2020-05-06 ENCOUNTER — Ambulatory Visit (HOSPITAL_COMMUNITY)
Admission: EM | Admit: 2020-05-06 | Discharge: 2020-05-06 | Disposition: A | Discharge status: Home / Self Care | Payer: Medicare HMO | Attending: Urgent Care | Admitting: Urgent Care

## 2020-05-06 ENCOUNTER — Encounter (HOSPITAL_COMMUNITY): Payer: Self-pay

## 2020-05-06 ENCOUNTER — Other Ambulatory Visit: Payer: Self-pay

## 2020-05-06 ENCOUNTER — Ambulatory Visit (INDEPENDENT_AMBULATORY_CARE_PROVIDER_SITE_OTHER): Payer: Medicare HMO

## 2020-05-06 DIAGNOSIS — M545 Low back pain, unspecified: Secondary | ICD-10-CM

## 2020-05-06 DIAGNOSIS — W19XXXA Unspecified fall, initial encounter: Secondary | ICD-10-CM

## 2020-05-06 DIAGNOSIS — Z9889 Other specified postprocedural states: Secondary | ICD-10-CM

## 2020-05-06 MED ORDER — ACETAMINOPHEN 325 MG PO TABS
650.0000 mg | ORAL_TABLET | Freq: Once | ORAL | Status: AC
  Administered 2020-05-06: 650 mg via ORAL

## 2020-05-06 MED ORDER — ACETAMINOPHEN 325 MG PO TABS
ORAL_TABLET | ORAL | Status: AC
  Filled 2020-05-06: qty 2

## 2020-05-06 NOTE — ED Provider Notes (Signed)
[place]   MRN: [id number] DOB: [birth year]  Subjective:   [name] is a 84 y.o. female presenting for suffering an accidental fall this morning around 9 AM.  Patient states that she slipped backwards and fell, thinks she landed directly on her husband shoe making impact on the right side of her back.  She has since had progressively worsening low back pain, worse over the right side.  Has not taken any medications for relief.  Has a history of anterior lumbar fusion in 2018.  Denies weakness, loss consciousness, head injury, hematuria, numbness or tingling, radicular symptoms.   Current Facility-Administered Medications:  .  acetaminophen (TYLENOL) tablet 650 mg, 650 mg, Oral, Once, Jaynee Eagles, PA-C  Current Outpatient Medications:  .  acetaminophen (TYLENOL) 500 MG tablet, Take 500 mg by mouth daily as needed for mild pain or headache. , Disp: , Rfl:  .  ALPRAZolam (XANAX) 0.5 MG tablet, TAKE 1/2 TO 1 TABLET(0.25 TO 0.5 MG) BY MOUTH TWICE DAILY AS NEEDED FOR ANXIETY, Disp: 20 tablet, Rfl: 0 .  aspirin 81 MG tablet, Take 81 mg by mouth daily., Disp: , Rfl:  .  brimonidine-timolol (COMBIGAN) 0.2-0.5 % ophthalmic solution, Place 1 drop into both eyes every 12 (twelve) hours., Disp: , Rfl:  .  ezetimibe-simvastatin (VYTORIN) 10-20 MG tablet, Take 1 tablet by mouth daily., Disp: 90 tablet, Rfl: 3 .  nadolol (CORGARD) 40 MG tablet, TAKE 1 TABLET(40 MG) BY MOUTH AT BEDTIME, Disp: 90 tablet, Rfl: 2 .  nitroGLYCERIN (NITROSTAT) 0.4 MG SL tablet, Place 1 tablet (0.4 mg total) under the tongue every 5 (five) minutes as needed., Disp: 25 tablet, Rfl: 3 .  nystatin ointment (MYCOSTATIN), APPLY TO THE AFFECTED AREA 1 TO 2 TIMES A DAY. USE FOR UP TO 10 DAYS, Disp: 30 g, Rfl: 0 .  omeprazole (PRILOSEC) 40 MG capsule, TAKE 1 CAPSULE(40 MG) BY MOUTH DAILY, Disp: 90 capsule, Rfl: 0 .  sodium chloride (OCEAN) 0.65 % SOLN nasal spray, Place 1 spray into both nostrils 2 (two) times  daily as needed for congestion., Disp: , Rfl:  .  tiZANidine (ZANAFLEX) 2 MG tablet, Take by mouth every 6 (six) hours as needed for muscle spasms., Disp: , Rfl:  .  traZODone (DESYREL) 100 MG tablet, TAKE 1/4 TO 1/2 TABLET BY MOUTH AT BEDTIME IF NEEDED FOR SLEEP, Disp: 45 tablet, Rfl: 1 .  venlafaxine XR (EFFEXOR-XR) 150 MG 24 hr capsule, TAKE 1 CAPSULE(150 MG) BY MOUTH TWICE DAILY, Disp: 180 capsule, Rfl: 0   Allergies  Allergen Reactions  . Citrate Of Magnesia Other (See Comments)    confusion  . Codeine Nausea And Vomiting  . Shingrix [Zoster Vac Recomb Adjuvanted] Swelling and Rash    Rash and temp x several days. (low grade 100 -101 )     Past Medical History:  Diagnosis Date  . Anxiety   . Colon polyps   . Complication of anesthesia    takes longer to wake up- admitted overnight after colonoscopy  . Depression   . GERD (gastroesophageal reflux disease)   . Glaucoma   . Headache    migraines  . Hx: UTI (urinary tract infection)   . Hypercholesteremia   . Hypertension   . Lichen simplex chronicus   . Lumbar stenosis with neurogenic claudication   . Macular degeneration   . Osteoarthritis   . Osteopenia    of the hip  . PONV (postoperative nausea and vomiting)   . Stress  incontinence, female   . Unsteady gait   . Wears glasses   . Whooping cough    as a baby     Past Surgical History:  Procedure Laterality Date  . ABDOMINAL HYSTERECTOMY  1977   ovaries remain  . ANTERIOR LAT LUMBAR FUSION N/A 10/26/2017   Procedure: LUMBAR THREE- LUMBAR FOUR, LUMBAR FOUR- LUMBAR FIVE ANTEROLATERAL LUMBAR INTERBODY ARTHRODESIS;  Surgeon: Jovita Gamma, MD;  Location: [facility];  Service: Neurosurgery;  Laterality: N/A;  LUMBAR 3- LUMBAR 4, LUMBAR 4- LUMBAR 5 ANTEROLATERAL LUMBAR INTERBODY ARTHRODESIS, LUMBAR 3- LUMBAR 4, LUMBAR 4- LUMBAR 5 PERCUTANEOUS PEDICLE SCREW FIXATION  . APPENDECTOMY    . BLADDER REPAIR     x two  . CHOLECYSTECTOMY    . COLONOSCOPY    . EYE SURGERY  11/14,  12/14   Macular Dengeneration, Glaucoma  . EYE SURGERY  2016   left eye  . LESION REMOVAL Right 06/28/2013   Procedure: EXCISION 3 cm right labial sebaceous cyst;  Surgeon: Lyman Speller, MD;  Location: [facility];  Service: Gynecology;  Laterality: Right;  . LUMBAR LAMINECTOMY/DECOMPRESSION MICRODISCECTOMY Right 03/20/2016   Procedure: Right - Lumbar four-five lumbar laminotomy, foraminotomy, and possible microdiscectomy;  Surgeon: Jovita Gamma, MD;  Location: [facility];  Service: Neurosurgery;  Laterality: Right;  right  . LUMBAR PERCUTANEOUS PEDICLE SCREW 2 LEVEL  10/26/2017   Procedure: LUMBAR THREE- LUMBAR FOUR, LUMBAR FOUR- LUMBAR FIVE PERCUTANEOUS PEDICLE SCREW FIXATION;  Surgeon: Jovita Gamma, MD;  Location: [place];  Service: Neurosurgery;;  . MOHS SURGERY     procedure to remove basal cell  . TONSILLECTOMY AND ADENOIDECTOMY    . URETHRAL SLING  2007  . URETHRAL SLING  1/12   midurethral   . vertebroplasty secondary to traumatic compression fracture      Family History  Problem Relation Age of Onset  . Congestive Heart Failure Mother   . Thyroid disease Mother   . Osteoporosis Mother   . Alcoholism Mother   . Prostate cancer Father   . Breast cancer Maternal Grandmother   . Hypertension Sister   . Thyroid disease Sister   . Cancer Sister        brain ?  Marland Kitchen Rheum arthritis Daughter     Social History   Tobacco Use  . Smoking status: Former Smoker    Quit date: 08/11/1965    Years since quitting: 54.7  . Smokeless tobacco: Never Used  . Tobacco comment: has not smoked in 45 years   Vaping Use  . Vaping Use: Never used  Substance Use Topics  . Alcohol use: Not Currently    Alcohol/week: 1.0 standard drink    Types: 1 Standard drinks or equivalent per week    Comment: occ   . Drug use: No    ROS   Objective:   Vitals: BP 138/74 (BP Location: Left Arm)   Pulse 64   Temp 98.3 F (36.8 C) (Oral)   Resp 18   LMP 08/11/1974   SpO2 95%   Physical  Exam Constitutional:      General: She is not in acute distress.    Appearance: Normal appearance. She is well-developed. She is not ill-appearing, toxic-appearing or diaphoretic.  HENT:     Head: Normocephalic and atraumatic.     Nose: Nose normal.     Mouth/Throat:     Mouth: Mucous membranes are moist.     Pharynx: Oropharynx is clear.  Eyes:     General: No scleral  icterus.       Right eye: No discharge.        Left eye: No discharge.     Extraocular Movements: Extraocular movements intact.     Conjunctiva/sclera: Conjunctivae normal.     Pupils: Pupils are equal, round, and reactive to light.  Cardiovascular:     Rate and Rhythm: Normal rate.  Pulmonary:     Effort: Pulmonary effort is normal.  Musculoskeletal:     Lumbar back: Spasms and tenderness (Over areas outlined) present. No swelling, edema, deformity, signs of trauma, lacerations or bony tenderness. Decreased range of motion. Negative right straight leg raise test and negative left straight leg raise test. No scoliosis.       Back:     Comments: Strength 5/5 for bilateral lower extremities.  Full range of motion.  Skin:    General: Skin is warm and dry.  Neurological:     General: No focal deficit present.     Mental Status: She is alert and oriented to person, place, and time.     Motor: No weakness.     Gait: Gait normal.     Deep Tendon Reflexes: Reflexes normal.  Psychiatric:        Mood and Affect: Mood normal.        Behavior: Behavior normal.        Thought Content: Thought content normal.        Judgment: Judgment normal.     DG Lumbar Spine Complete  Result Date: 05/06/2020 CLINICAL DATA:  Low back pain. Right-sided lumbar back pain after fall today. EXAM: LUMBAR SPINE - COMPLETE 4+ VIEW COMPARISON:  Radiograph 04/09/2020 FINDINGS: Posterior L3-L5 fusion with interbody spacers. The hardware is intact. Remote L1 compression fracture with vertebral augmentation. No evidence of acute fracture.  Degenerative disc disease at L2-L3 and L5-S1 similar to prior exam. Broad-based dextroscoliotic curvature is unchanged. The sacroiliac joints are congruent. The bones are diffusely under mineralized. IMPRESSION: 1. No evidence of acute fracture of the lumbar spine. 2. Remote L1 compression fracture with vertebral augmentation. Intact surgical hardware in the lower lumbar spine. Electronically Signed   By: Keith Rake M.D.   On: 05/06/2020 18:03     Assessment and Plan :   PDMP not reviewed this encounter.  1. Acute bilateral low back pain without sciatica   2. Fall in home, initial encounter   3. History of back surgery     Patient's pain improved dramatically following 650 mg of Tylenol.  Recommended general conservative management for her back pain, x-ray negative. Counseled patient on potential for adverse effects with medications prescribed/recommended today, ER and return-to-clinic precautions discussed, patient verbalized understanding.    Jaynee Eagles, [place] 05/06/20 1832

## 2020-05-06 NOTE — Discharge Instructions (Signed)
Do not use any nonsteroidal anti-inflammatories (NSAIDs) like ibuprofen, Motrin, naproxen, Aleve, etc. which are all available over-the-counter.  Please just use Tylenol at a dose of 500mg-650mg once every 6 hours as needed for your aches, pains, fevers. 

## 2020-05-06 NOTE — ED Triage Notes (Signed)
Pt presents with right sided lower back pain and middle back pain after she fell around 9 am today. States she was standing next to the dresser in her room and she fell backward.

## 2020-05-09 NOTE — Progress Notes (Signed)
Phone [phone] In person visit   Subjective:   [name] is a 84 y.o. year old very pleasant female patient who presents for/with See problem oriented charting Chief Complaint  Patient presents with  . Follow-up    [facility] 9/26, Fall, back pain, not improving   This visit occurred during the SARS-CoV-2 public health emergency.  Safety protocols were in place, including screening questions prior to the visit, additional usage of staff PPE, and extensive cleaning of exam room while observing appropriate contact time as indicated for disinfecting solutions.   Past Medical History-  Patient Active Problem List   Diagnosis Date Noted  . Liver lesion 11/10/2017    Priority: High  . Memory changes 03/06/2017    Priority: Medium  . Lumbar stenosis with neurogenic claudication 03/20/2016    Priority: Medium  . Scalp pain 10/22/2015    Priority: Medium  . Macular degeneration of right eye 03/03/2013    Priority: Medium  . Glaucoma 03/03/2013    Priority: Medium  . Hyperlipidemia 03/14/2009    Priority: Medium  . Depression 10/19/2007    Priority: Medium  . Essential hypertension 10/19/2007    Priority: Medium  . GASTROESOPHAGEAL REFLUX DISEASE 10/19/2007    Priority: Medium  . Chest pain 01/24/2018    Priority: Low  . Post-traumatic headache 10/11/2015    Priority: Low  . Routine health maintenance 03/06/2013    Priority: Low  . Osteoarthritis 10/19/2007    Priority: Low  . STRESS INCONTINENCE 10/19/2007    Priority: Low  . Allergic rhinitis 10/19/2007    Priority: Low  . Osteopenia of neck of right femur 03/29/2019  . Nausea & vomiting 10/31/2017  . Chronic daily headache 07/08/2017    Medications- reviewed and updated Current Outpatient Medications  Medication Sig Dispense Refill  . acetaminophen (TYLENOL) 500 MG tablet Take 500 mg by mouth daily as needed for mild pain or headache.     . ALPRAZolam (XANAX) 0.5 MG tablet TAKE 1/2 TO 1 TABLET(0.25 TO 0.5 MG) BY  MOUTH TWICE DAILY AS NEEDED FOR ANXIETY 20 tablet 0  . aspirin 81 MG tablet Take 81 mg by mouth daily.    . brimonidine-timolol (COMBIGAN) 0.2-0.5 % ophthalmic solution Place 1 drop into both eyes every 12 (twelve) hours.    Marland Kitchen ezetimibe-simvastatin (VYTORIN) 10-20 MG tablet Take 1 tablet by mouth daily. 90 tablet 3  . nadolol (CORGARD) 40 MG tablet TAKE 1 TABLET(40 MG) BY MOUTH AT BEDTIME 90 tablet 2  . nystatin ointment (MYCOSTATIN) APPLY TO THE AFFECTED AREA 1 TO 2 TIMES A DAY. USE FOR UP TO 10 DAYS 30 g 0  . omeprazole (PRILOSEC) 40 MG capsule TAKE 1 CAPSULE(40 MG) BY MOUTH DAILY 90 capsule 0  . sodium chloride (OCEAN) 0.65 % SOLN nasal spray Place 1 spray into both nostrils 2 (two) times daily as needed for congestion.    Marland Kitchen tiZANidine (ZANAFLEX) 2 MG tablet Take by mouth every 6 (six) hours as needed for muscle spasms.    . traZODone (DESYREL) 100 MG tablet TAKE 1/4 TO 1/2 TABLET BY MOUTH AT BEDTIME IF NEEDED FOR SLEEP 45 tablet 1  . venlafaxine XR (EFFEXOR-XR) 150 MG 24 hr capsule TAKE 1 CAPSULE(150 MG) BY MOUTH TWICE DAILY 180 capsule 0  . cephALEXin (KEFLEX) 500 MG capsule Take 1 capsule (500 mg total) by mouth 3 (three) times daily for 7 days. 21 capsule 0  . HYDROcodone-acetaminophen (NORCO/VICODIN) 5-325 MG tablet Take 1 tablet by mouth every 6 (six)  hours as needed for moderate pain or severe pain. 20 tablet 0  . nitroGLYCERIN (NITROSTAT) 0.4 MG SL tablet Place 1 tablet (0.4 mg total) under the tongue every 5 (five) minutes as needed. (Patient not taking: Reported on 05/11/2020) 25 tablet 3   No current facility-administered medications for this visit.     Objective:  BP (!) 108/46 (BP Location: Left Arm, Patient Position: Sitting, Cuff Size: Small)   Pulse 60   Temp 98.2 F (36.8 C) (Temporal)   Resp 18   Ht 5\' 3"  (1.6 m)   Wt 155 lb (70.3 kg)   LMP 08/11/1974   SpO2 98%   BMI 27.46 kg/m  Gen: NAD, resting comfortably CV: RRR no murmurs rubs or gallops Lungs: CTAB no  crackles, wheeze, rhonchi Back -midline tenderness noted.  Paraspinous muscles are tender and with spasm on the right side-normal on the left.  Neuro- no saddle anesthesia, 5/5 strength lower extremities,      Assessment and Plan   # Back Pain with history of lumbar stenosis with neurogenic claudication/degenerative disc disease S: fall on Sunday getting ready for church. Was still able to go to church but then had to go home. Even after urgent care visit pain has been worsening. Prior remote fracture noted but nothing acute- stable degenerative changes on x-ray as well as prior hardware from surgery in 2019.   Pain 10/10. Hurts in right low back and radiates upwards. Also has midline pain. No new incontinence. Has burning with peeing and odor in urine. 3 weeks of symptoms. Sitting slightly better- movement is worse and walking worse.   Tylenol for pain along with tizanidine from the back doctor- last seen 6 weeks ago. Dr. Reatha Armour  With [facility].   ROS-No saddle anesthesia, new bladder incontinence, new fecal incontinence, weakness in extremity, numbness or tingling in extremity.  history of cancer, fever, chills, unintentional weight loss, recent bacterial infection, recent IV drug use, HIV, pain worse at night or while supine.  A/P: Patient with significant worsening of low back pain with known degenerative disc disease lumbar stenosis and history of surgery in 2019.  Concerning for exacerbation of the above - Has already had x-rays with urgent care.   -I was able to contact Dr. Reatha Armour through secure messaging and he agreed to try to work patient in on Monday-may need some different views on x-ray and potentially MRI to further evaluate with severity of pain -Her pain is very poorly controlled.  I do not think tramadol is ideal given she is already on trazodone and venlafaxine-instead we opted to try hydrocodone half tablet to start every 6 hours.  Also counseled on  potential constipation  Given no recent labs we opted to do a CBC and CMP with pain  Patient also with worsening dysuria and odor.  She is incontinent at baseline and has been followed at urology and required catheterizations in the past.  Plan was to get urine collection through urinal if possible but it does not appear she was able to collect.  We did opt out of precaution to treat with [facility] recommend follow-up with urology if fails to improve as we are not set up for catheterization-I did not want to delay care though   Recommended follow up: Keep November visit or sooner if needed Future Appointments  Date Time Provider [place]  06/19/2020  8:20 AM Marin Olp, MD [facility]  07/25/2020  1:40 PM Sherren Mocha, MD [facility]  04/25/2021  11:45 AM [facility]    Lab/Order associations:   ICD-10-CM   1. Lumbar stenosis with neurogenic claudication  M48.062   2. Other osteoarthritis of spine, lumbar region  M47.896   3. Dysuria  R30.0 POCT Urinalysis Dipstick (Automated)    Urine Culture    CBC With Differential/Platelet    COMPLETE METABOLIC PANEL WITH GFR    CBC With Differential/Platelet    COMPLETE METABOLIC PANEL WITH GFR    Meds ordered this encounter  Medications  . DISCONTD: cephALEXin (KEFLEX) 500 MG capsule    Sig: Take 1 capsule (500 mg total) by mouth 3 (three) times daily for 7 days.    Dispense:  21 capsule    Refill:  0  . cephALEXin (KEFLEX) 500 MG capsule    Sig: Take 1 capsule (500 mg total) by mouth 3 (three) times daily for 7 days.    Dispense:  21 capsule    Refill:  0  . HYDROcodone-acetaminophen (NORCO/VICODIN) 5-325 MG tablet    Sig: Take 1 tablet by mouth every 6 (six) hours as needed for moderate pain or severe pain.    Dispense:  20 tablet    Refill:  0    Return precautions advised.  Garret Reddish, MD

## 2020-05-09 NOTE — Patient Instructions (Addendum)
Flu shot when feeling better  Severe back pain-x-rays reassuring.  I want you to try to get into neurosurgery.  We are going to help in the short run with pain medication.  I have sent in hydrocodone for you to take up to every 6 hours but I want you to start with just a half a tablet.  If you have confusion with this please stop the medicine.  Do not take Tylenol while you are taking hydrocodone.  For constipation I want you to take a half capful of MiraLAX daily and increase to a full capful if no bowel movement every day.   If you have new or worsening symptoms over the weekend particularly weakness in the legs or worsening pain or new incontinence of stool-seek care in emergency room  I sent in an antibiotic for you-hoping this will help-we should have the culture back by early next week to make sure this is the right antibiotic   Please stop by lab before you go If you have mychart- we will send your results within 3 business days of [place] receiving them.  If you do not have mychart- we will call you about results within 5 business days of [place] receiving them.  *please note we are currently using [facility] which has a longer processing time than [place] typically so labs may not come back as quickly as in the past *please also note that you will see labs on mychart as soon as they post. I will later go in and write notes on them- will say "notes from Dr. Yong Channel"    Dr. Reatha Armour said his office is going to try to get you in on Monday- that is great news! Please call them if you dont hear by Monday morning

## 2020-05-11 ENCOUNTER — Encounter: Payer: Self-pay | Admitting: Family Medicine

## 2020-05-11 ENCOUNTER — Ambulatory Visit (INDEPENDENT_AMBULATORY_CARE_PROVIDER_SITE_OTHER): Payer: Medicare HMO | Admitting: Family Medicine

## 2020-05-11 ENCOUNTER — Other Ambulatory Visit: Payer: Self-pay

## 2020-05-11 VITALS — BP 108/46 | HR 60 | Temp 98.2°F | Resp 18 | Ht 63.0 in | Wt 155.0 lb

## 2020-05-11 DIAGNOSIS — M48062 Spinal stenosis, lumbar region with neurogenic claudication: Secondary | ICD-10-CM | POA: Diagnosis not present

## 2020-05-11 DIAGNOSIS — M47896 Other spondylosis, lumbar region: Secondary | ICD-10-CM

## 2020-05-11 DIAGNOSIS — R300 Dysuria: Secondary | ICD-10-CM | POA: Diagnosis not present

## 2020-05-11 MED ORDER — HYDROCODONE-ACETAMINOPHEN 5-325 MG PO TABS: 1.0000 | ORAL_TABLET | Freq: Four times a day (QID) | ORAL | 0 refills | Status: DC | PRN

## 2020-05-11 MED ORDER — CEPHALEXIN 500 MG PO CAPS: 500.0000 mg | ORAL_CAPSULE | Freq: Three times a day (TID) | ORAL | 0 refills | Status: AC

## 2020-05-11 MED ORDER — CEPHALEXIN 500 MG PO CAPS: 500.0000 mg | ORAL_CAPSULE | Freq: Three times a day (TID) | ORAL | 0 refills | Status: DC

## 2020-05-12 LAB — COMPLETE METABOLIC PANEL WITH GFR
AG Ratio: 1.7 (calc) (ref 1.0–2.5)
ALT: 11 U/L (ref 6–29)
AST: 16 U/L (ref 10–35)
Albumin: 4.2 g/dL (ref 3.6–5.1)
Alkaline phosphatase (APISO): 69 U/L (ref 37–153)
BUN: 16 mg/dL (ref 7–25)
CO2: 29 mmol/L (ref 20–32)
Calcium: 9.6 mg/dL (ref 8.6–10.4)
Chloride: 102 mmol/L (ref 98–110)
Creat: 0.78 mg/dL (ref 0.60–0.88)
GFR, Est African American: 82 mL/min/{1.73_m2} (ref >=60)
GFR, Est Non African American: 71 mL/min/{1.73_m2} (ref >=60)
Globulin: 2.5 g/dL (calc) (ref 1.9–3.7)
Glucose, Bld: 82 mg/dL (ref 65–99)
Potassium: 4.4 mmol/L (ref 3.5–5.3)
Sodium: 139 mmol/L (ref 135–146)
Total Bilirubin: 0.4 mg/dL (ref 0.2–1.2)
Total Protein: 6.7 g/dL (ref 6.1–8.1)

## 2020-05-12 LAB — CBC WITH DIFFERENTIAL/PLATELET
Absolute Monocytes: 493 cells/uL (ref 200–950)
Basophils Absolute: 37 cells/uL (ref 0–200)
Basophils Relative: 0.4 %
Eosinophils Absolute: 0 cells/uL — ABNORMAL LOW (ref 15–500)
Eosinophils Relative: 0 %
HCT: 43.5 % (ref 35.0–45.0)
Hemoglobin: 14.7 g/dL (ref 11.7–15.5)
Lymphs Abs: 1395 cells/uL (ref 850–3900)
MCH: 32.5 pg (ref 27.0–33.0)
MCHC: 33.8 g/dL (ref 32.0–36.0)
MCV: 96 fL (ref 80.0–100.0)
MPV: 11.2 fL (ref 7.5–12.5)
Monocytes Relative: 5.3 %
Neutro Abs: 7375 cells/uL (ref 1500–7800)
Neutrophils Relative %: 79.3 %
Platelets: 221 10*3/uL (ref 140–400)
RBC: 4.53 10*6/uL (ref 3.80–5.10)
RDW: 12.3 % (ref 11.0–15.0)
Total Lymphocyte: 15 %
WBC: 9.3 10*3/uL (ref 3.8–10.8)

## 2020-05-14 DIAGNOSIS — G8929 Other chronic pain: Secondary | ICD-10-CM | POA: Diagnosis not present

## 2020-05-14 DIAGNOSIS — M4155 Other secondary scoliosis, thoracolumbar region: Secondary | ICD-10-CM | POA: Diagnosis not present

## 2020-05-16 ENCOUNTER — Other Ambulatory Visit: Payer: Self-pay | Admitting: Neurological Surgery

## 2020-05-16 DIAGNOSIS — Z8781 Personal history of (healed) traumatic fracture: Secondary | ICD-10-CM

## 2020-05-21 ENCOUNTER — Other Ambulatory Visit: Payer: Self-pay

## 2020-05-21 ENCOUNTER — Other Ambulatory Visit: Payer: Medicare HMO

## 2020-05-21 DIAGNOSIS — R300 Dysuria: Secondary | ICD-10-CM

## 2020-05-23 LAB — URINE CULTURE
MICRO NUMBER:: 11055913
SPECIMEN QUALITY:: ADEQUATE

## 2020-05-25 ENCOUNTER — Telehealth: Payer: Self-pay

## 2020-05-25 MED ORDER — TRAZODONE HCL 100 MG PO TABS: ORAL_TABLET | ORAL | 1 refills | Status: DC

## 2020-05-25 NOTE — Telephone Encounter (Signed)
You may refill this

## 2020-05-25 NOTE — Telephone Encounter (Signed)
  LAST APPOINTMENT DATE: 05/11/2020   NEXT APPOINTMENT DATE:@Visit  date not found  MEDICATION: traZODone (DESYREL) 100 MG tablet  PHARMACY: [id number] [place], [place] - [address]  Okay for refill?  Please advise

## 2020-05-25 NOTE — Telephone Encounter (Signed)
Refill has been faxed to the pharmacy. Confirmation fax has been received.

## 2020-05-28 ENCOUNTER — Telehealth: Payer: Self-pay

## 2020-05-28 NOTE — Telephone Encounter (Signed)
Has E. Coli UTI

## 2020-05-28 NOTE — Telephone Encounter (Signed)
Pt would like to be called about her urinalysis. She is wanting to know what is in her urine.

## 2020-05-28 NOTE — Telephone Encounter (Signed)
Pt had culture done on 10/11. Your notes "The antibiotic patient was placed on should adequately treat this infection.  If fails to improve would recommend follow-up with urology or return visit with me". But nothing stating what was in her urine, unsure of what to tell pt.

## 2020-05-28 NOTE — Telephone Encounter (Signed)
Spoke with pt. Pt states having urinary sx since seen urologist last week. Pt states was never called about urine results from Urology appt. Pt advised to call [facility] about results and continued sx. Pt agreeable and thankful for advice. Encounter closed

## 2020-05-28 NOTE — Telephone Encounter (Signed)
New message    The patient needs to talk with the nurse for a minute or so going to another MD due to infection in bladder.   Asking for a call back to discuss

## 2020-05-29 ENCOUNTER — Other Ambulatory Visit: Payer: Self-pay

## 2020-05-29 MED ORDER — CEPHALEXIN 500 MG PO CAPS: 500.0000 mg | ORAL_CAPSULE | Freq: Three times a day (TID) | ORAL | 0 refills | Status: DC

## 2020-05-29 NOTE — Telephone Encounter (Signed)
Called to follow up with pt and pt states she has not received an antibiotic, and there isnt one on her current med list. Should pt be on a current antibiotic? If so, she would like it sent to [facility].

## 2020-05-29 NOTE — Telephone Encounter (Signed)
Look at the day of her visit-she was treated with Keflex that day- did she never picked this up?

## 2020-05-29 NOTE — Telephone Encounter (Signed)
Antibiotic sent to pharmacy.  

## 2020-05-29 NOTE — Telephone Encounter (Signed)
Gabrielle Ngo- look at 05/11/2020 note and meds sent in that day-  this was clearly sent in (am I missing something? Not sure why this message is being sent back to me)- she may go pick this up- you can send to a different pharmacy if needed- cephalexin

## 2020-05-29 NOTE — Telephone Encounter (Signed)
She said nothing was ever sent to the pharmacy to be picked up, no she hasn't picked up anything.

## 2020-06-06 ENCOUNTER — Ambulatory Visit
Admission: RE | Admit: 2020-06-06 | Discharge: 2020-06-06 | Disposition: A | Discharge status: Home / Self Care | Payer: Medicare HMO | Source: Ambulatory Visit | Attending: Neurological Surgery | Admitting: Neurological Surgery

## 2020-06-06 DIAGNOSIS — Z8781 Personal history of (healed) traumatic fracture: Secondary | ICD-10-CM

## 2020-06-06 DIAGNOSIS — M545 Low back pain, unspecified: Secondary | ICD-10-CM | POA: Diagnosis not present

## 2020-06-06 DIAGNOSIS — M5126 Other intervertebral disc displacement, lumbar region: Secondary | ICD-10-CM | POA: Diagnosis not present

## 2020-06-06 DIAGNOSIS — M2578 Osteophyte, vertebrae: Secondary | ICD-10-CM | POA: Diagnosis not present

## 2020-06-06 DIAGNOSIS — M48061 Spinal stenosis, lumbar region without neurogenic claudication: Secondary | ICD-10-CM | POA: Diagnosis not present

## 2020-06-06 DIAGNOSIS — Z981 Arthrodesis status: Secondary | ICD-10-CM | POA: Diagnosis not present

## 2020-06-06 DIAGNOSIS — M47816 Spondylosis without myelopathy or radiculopathy, lumbar region: Secondary | ICD-10-CM | POA: Diagnosis not present

## 2020-06-06 DIAGNOSIS — M5137 Other intervertebral disc degeneration, lumbosacral region: Secondary | ICD-10-CM | POA: Diagnosis not present

## 2020-06-07 DIAGNOSIS — M47816 Spondylosis without myelopathy or radiculopathy, lumbar region: Secondary | ICD-10-CM | POA: Diagnosis not present

## 2020-06-07 DIAGNOSIS — Z8781 Personal history of (healed) traumatic fracture: Secondary | ICD-10-CM | POA: Diagnosis not present

## 2020-06-07 DIAGNOSIS — Z6831 Body mass index (BMI) 31.0-31.9, adult: Secondary | ICD-10-CM | POA: Diagnosis not present

## 2020-06-07 DIAGNOSIS — R030 Elevated blood-pressure reading, without diagnosis of hypertension: Secondary | ICD-10-CM | POA: Diagnosis not present

## 2020-06-11 ENCOUNTER — Telehealth: Payer: Self-pay

## 2020-06-11 NOTE — Telephone Encounter (Signed)
Patient called in stating she is wanting to speak with Dr.Hunter, to see if he can send in another prescription for the UTI, advised patient that he was not in office to which patient then asked if we could call him and ask to send something in, when I explained to patient we were unable to do that she got mad and said she will call her urologist.

## 2020-06-11 NOTE — Telephone Encounter (Signed)
FYI

## 2020-06-11 NOTE — Telephone Encounter (Signed)
Pt called stating she was being treated for a UTI last week. She finished her last round of antibiotics last Wednesday, 06/06/2020. Pt states she is starting to experience symptoms again and asked if she could be prescribed anything else. Please advise.

## 2020-06-11 NOTE — Telephone Encounter (Signed)
Called and spoke with pt and informed pt from Dr. Yong Channel result note she needs to f/u in office with him or her urologist. Pt will call and schedule f/u with Dr. Yong Channel.

## 2020-06-12 ENCOUNTER — Telehealth: Payer: Self-pay

## 2020-06-12 MED ORDER — ALPRAZOLAM 0.5 MG PO TABS: ORAL_TABLET | ORAL | 0 refills | Status: DC

## 2020-06-12 NOTE — Telephone Encounter (Signed)
Called and lm for pt tcb. 

## 2020-06-12 NOTE — Addendum Note (Signed)
Addended by: Marin Olp on: 06/12/2020 12:48 PM   Modules accepted: Orders

## 2020-06-12 NOTE — Telephone Encounter (Signed)
I refilled this-if she has any falls or confusion with this needs to stop this medication

## 2020-06-12 NOTE — Telephone Encounter (Signed)
[facility] on her ALPRAZolam (XANAX) 0.5 MG tablet. Patient stated that her previous prescription for this medication has expired.

## 2020-06-12 NOTE — Telephone Encounter (Signed)
Please check on patient.  Please inform her I had a colonoscopy that day and was unable to make any medical decisions legally including sending in prescriptions.  Did this get addressed by urology?

## 2020-06-13 DIAGNOSIS — H401132 Primary open-angle glaucoma, bilateral, moderate stage: Secondary | ICD-10-CM | POA: Diagnosis not present

## 2020-06-18 ENCOUNTER — Encounter: Payer: Self-pay | Admitting: Family Medicine

## 2020-06-18 ENCOUNTER — Other Ambulatory Visit: Payer: Self-pay

## 2020-06-18 ENCOUNTER — Ambulatory Visit (INDEPENDENT_AMBULATORY_CARE_PROVIDER_SITE_OTHER): Payer: Medicare HMO | Admitting: Family Medicine

## 2020-06-18 VITALS — BP 128/76 | HR 74 | Temp 98.2°F | Resp 18 | Ht 63.0 in | Wt 159.0 lb

## 2020-06-18 DIAGNOSIS — Z23 Encounter for immunization: Secondary | ICD-10-CM | POA: Diagnosis not present

## 2020-06-18 DIAGNOSIS — R300 Dysuria: Secondary | ICD-10-CM

## 2020-06-18 NOTE — Progress Notes (Signed)
Phone [phone] In person visit   Subjective:   [name] is a 84 y.o. year old very pleasant female patient who presents for/with See problem oriented charting Chief Complaint  Patient presents with  . Urinary Tract Infection   This visit occurred during the SARS-CoV-2 public health emergency.  Safety protocols were in place, including screening questions prior to the visit, additional usage of staff PPE, and extensive cleaning of exam room while observing appropriate contact time as indicated for disinfecting solutions.   Past Medical History-  Patient Active Problem List   Diagnosis Date Noted  . Liver lesion 11/10/2017    Priority: High  . Memory changes 03/06/2017    Priority: Medium  . Lumbar stenosis with neurogenic claudication 03/20/2016    Priority: Medium  . Scalp pain 10/22/2015    Priority: Medium  . Macular degeneration of right eye 03/03/2013    Priority: Medium  . Glaucoma 03/03/2013    Priority: Medium  . Hyperlipidemia 03/14/2009    Priority: Medium  . Depression 10/19/2007    Priority: Medium  . Essential hypertension 10/19/2007    Priority: Medium  . GASTROESOPHAGEAL REFLUX DISEASE 10/19/2007    Priority: Medium  . Chest pain 01/24/2018    Priority: Low  . Post-traumatic headache 10/11/2015    Priority: Low  . Routine health maintenance 03/06/2013    Priority: Low  . Osteoarthritis 10/19/2007    Priority: Low  . STRESS INCONTINENCE 10/19/2007    Priority: Low  . Allergic rhinitis 10/19/2007    Priority: Low  . Osteopenia of neck of right femur 03/29/2019  . Nausea & vomiting 10/31/2017  . Chronic daily headache 07/08/2017    Medications- reviewed and updated Current Outpatient Medications  Medication Sig Dispense Refill  . acetaminophen (TYLENOL) 500 MG tablet Take 500 mg by mouth daily as needed for mild pain or headache.     . ALPRAZolam (XANAX) 0.5 MG tablet TAKE 1/2 TO 1 TABLET(0.25 TO 0.5 MG) BY MOUTH TWICE DAILY AS NEEDED  FOR ANXIETY 20 tablet 0  . aspirin 81 MG tablet Take 81 mg by mouth daily.    . brimonidine-timolol (COMBIGAN) 0.2-0.5 % ophthalmic solution Place 1 drop into both eyes every 12 (twelve) hours.    Marland Kitchen ezetimibe-simvastatin (VYTORIN) 10-20 MG tablet Take 1 tablet by mouth daily. 90 tablet 3  . HYDROcodone-acetaminophen (NORCO/VICODIN) 5-325 MG tablet Take 1 tablet by mouth every 6 (six) hours as needed for moderate pain or severe pain. 20 tablet 0  . nadolol (CORGARD) 40 MG tablet TAKE 1 TABLET(40 MG) BY MOUTH AT BEDTIME 90 tablet 2  . nitroGLYCERIN (NITROSTAT) 0.4 MG SL tablet Place 1 tablet (0.4 mg total) under the tongue every 5 (five) minutes as needed. 25 tablet 3  . nystatin ointment (MYCOSTATIN) APPLY TO THE AFFECTED AREA 1 TO 2 TIMES A DAY. USE FOR UP TO 10 DAYS 30 g 0  . omeprazole (PRILOSEC) 40 MG capsule TAKE 1 CAPSULE(40 MG) BY MOUTH DAILY 90 capsule 0  . sodium chloride (OCEAN) 0.65 % SOLN nasal spray Place 1 spray into both nostrils 2 (two) times daily as needed for congestion.    Marland Kitchen tiZANidine (ZANAFLEX) 2 MG tablet Take by mouth every 6 (six) hours as needed for muscle spasms.    . traZODone (DESYREL) 100 MG tablet TAKE 1/4 TO 1/2 TABLET BY MOUTH AT BEDTIME IF NEEDED FOR SLEEP 45 tablet 1  . venlafaxine XR (EFFEXOR-XR) 150 MG 24 hr capsule TAKE 1 CAPSULE(150 MG) BY MOUTH  TWICE DAILY 180 capsule 0   No current facility-administered medications for this visit.     Objective:  BP 128/76   Pulse 74   Temp 98.2 F (36.8 C) (Temporal)   Resp 18   Ht 5\' 3"  (1.6 m)   Wt 159 lb (72.1 kg)   LMP 08/11/1974   SpO2 98%   BMI 28.17 kg/m  Gen: NAD, resting comfortably CV: RRR no murmurs rubs or gallops Lungs: CTAB no crackles, wheeze, rhonchi Abdomen: soft/nontender/nondistended/normal bowel sounds. No rebound or guarding.  Mild right flank pain Ext: no edema Skin: warm, dry    Assessment and Plan   #Concern for UTI S: Patients symptoms started 2-4 weeks ago.  Complains of  dysuria: particularly at end of stream- feels slightly chilled at that time; polyuria: yes; nocturia: no; urgency: no.  Symptoms are stable.   Patient with UTI on  05/21/2020 confirmed and treated with keflex but symptoms did not resolve- only mild improvement 11/14/2019 confirmed Several other at lower levels <100k and reportedly had one with urology as well  ROS- no fever, nausea, vomiting. Mild right flank pain- biofreeze helps. No blood in urine. No vaginal discharge.  A/P: Urinalysis Could not be obtained- states nervous bladder in office- she is going to try to urinate at home and bring by tomorrow morning (knows to wait until tomorrow and bring by as soon as she pees). Possible UTI. Will get culture in addition to urinalysis. Empiric treatment we opted out- she think sshe can tolerate symptoms for 2 more days but if this changes she will let us know and I can send in antibiotic.  Patient to follow up if new or worsening symptoms or failure to improve.  -given prior lack of improvement may need prolonged course of antibiotics pending culture result.  -also encouraged her to discuss with urology possible prophylaxis to prevent UTI  Recommended follow up:  Future Appointments  Date Time Provider [place]  07/25/2020  1:40 PM Sherren Mocha, MD [facility]  04/25/2021 11:45 AM [facility]    Lab/Order associations:   ICD-10-CM   1. Need for immunization against influenza  Z23 Flu Vaccine QUAD High Dose(Fluad)  2. Dysuria  R30.0 POCT Urinalysis Dipstick    Urine Culture   Time Spent: 10 minutes of total time (4:14 PM- 4:24 PM) was spent on the date of the encounter performing the following actions: chart review prior to seeing the patient, obtaining history, performing a medically necessary exam, counseling on the treatment plan, placing orders, and documenting in our EHR.   Return precautions advised.  Garret Reddish, MD

## 2020-06-18 NOTE — Patient Instructions (Addendum)
Health Maintenance Due  Topic Date Due  . INFLUENZA VACCINE In office flu shot today high dose 03/11/2020   Urinalysis Could not be obtained- states nervous bladder in office- she is going to try to urinate at home and bring by tomorrow morning (knows to wait until tomorrow and bring by as soon as she pees). Possible UTI. Will get culture in addition to urinalysis. Empiric treatment we opted out- she think sshe can tolerate symptoms for 2 more days but if this changes she will let us know and I can send in antibiotic.   Recommended follow up: Return in about 4 months (around 10/16/2020) for physical or sooner if needed.

## 2020-06-19 ENCOUNTER — Other Ambulatory Visit: Payer: Medicare HMO

## 2020-06-19 ENCOUNTER — Ambulatory Visit: Payer: Medicare HMO | Admitting: Family Medicine

## 2020-06-19 DIAGNOSIS — R300 Dysuria: Secondary | ICD-10-CM

## 2020-06-19 LAB — POCT URINALYSIS DIPSTICK
Bilirubin, UA: NEGATIVE
Blood, UA: POSITIVE
Glucose, UA: NEGATIVE
Ketones, UA: NEGATIVE
Nitrite, UA: POSITIVE
Odor: POSITIVE
Protein, UA: NEGATIVE
Spec Grav, UA: 1.025 (ref 1.010–1.025)
Urobilinogen, UA: 0.2 E.U./dL
pH, UA: 6 (ref 5.0–8.0)

## 2020-06-19 NOTE — Addendum Note (Signed)
Addended by: Jerrel Ivory D on: 06/19/2020 12:05 PM   Modules accepted: Orders

## 2020-06-21 ENCOUNTER — Other Ambulatory Visit: Payer: Self-pay | Admitting: Family Medicine

## 2020-06-21 LAB — URINE CULTURE
MICRO NUMBER:: 11179368
SPECIMEN QUALITY:: ADEQUATE

## 2020-06-21 MED ORDER — CEPHALEXIN 500 MG PO CAPS: 500.0000 mg | ORAL_CAPSULE | Freq: Four times a day (QID) | ORAL | 0 refills | Status: DC

## 2020-06-22 ENCOUNTER — Other Ambulatory Visit: Payer: Self-pay

## 2020-06-22 MED ORDER — CEPHALEXIN 500 MG PO CAPS: 500.0000 mg | ORAL_CAPSULE | Freq: Four times a day (QID) | ORAL | 0 refills | Status: AC

## 2020-07-07 ENCOUNTER — Other Ambulatory Visit: Payer: Self-pay | Admitting: Family Medicine

## 2020-07-11 ENCOUNTER — Telehealth: Payer: Self-pay

## 2020-07-11 MED ORDER — NADOLOL 40 MG PO TABS: ORAL_TABLET | ORAL | 2 refills | Status: DC

## 2020-07-11 MED ORDER — EZETIMIBE-SIMVASTATIN 10-20 MG PO TABS
1.0000 | ORAL_TABLET | Freq: Every day | ORAL | 3 refills | Status: DC
Start: 2020-07-11 — End: 2021-05-09

## 2020-07-11 NOTE — Telephone Encounter (Signed)
Medication has been sent to the patient's pharmacy.  

## 2020-07-11 NOTE — Telephone Encounter (Signed)
MEDICATION: Vytorin 20 mg, corgard 40 mg  [facility]  Comments:   **Let patient know to contact pharmacy at the end of the day to make sure medication is ready. **  ** Please notify patient to allow 48-72 hours to process**  **Encourage patient to contact the pharmacy for refills or they can request refills through [facility]**

## 2020-07-12 DIAGNOSIS — N3021 Other chronic cystitis with hematuria: Secondary | ICD-10-CM | POA: Diagnosis not present

## 2020-07-25 ENCOUNTER — Encounter: Payer: Self-pay | Admitting: Cardiovascular Disease

## 2020-07-25 ENCOUNTER — Other Ambulatory Visit: Payer: Self-pay

## 2020-07-25 ENCOUNTER — Ambulatory Visit: Payer: Medicare HMO | Admitting: Cardiovascular Disease

## 2020-07-25 VITALS — BP 140/80 | HR 79 | Ht 63.0 in | Wt 161.4 lb

## 2020-07-25 DIAGNOSIS — E782 Mixed hyperlipidemia: Secondary | ICD-10-CM | POA: Diagnosis not present

## 2020-07-25 DIAGNOSIS — I10 Essential (primary) hypertension: Secondary | ICD-10-CM

## 2020-07-25 MED ORDER — HYDROCODONE-ACETAMINOPHEN 5-325 MG PO TABS: 1.0000 | ORAL_TABLET | Freq: Four times a day (QID) | ORAL | 0 refills | Status: DC | PRN

## 2020-07-25 NOTE — Telephone Encounter (Signed)
LR: 05-11-2020 Qty: 20 with 0 refills.   Last office visit: 05-11-2020 Upcoming appointment: 04-25-2021

## 2020-07-25 NOTE — Patient Instructions (Signed)
Medication Instructions:  Your provider recommends that you continue on your current medications as directed. Please refer to the Current Medication list given to you today.   *If you need a refill on your cardiac medications before your next appointment, please call your pharmacy*  Lab Work: Your provider recommends that you return for FASTING lab work.  If you have labs (blood work) drawn today and your tests are completely normal, you will receive your results only by: . MyChart Message (if you have MyChart) OR . A paper copy in the mail If you have any lab test that is abnormal or we need to change your treatment, we will call you to review the results.  Follow-Up: At [facility], you and your health needs are our priority.  As part of our continuing mission to provide you with exceptional heart care, we have created designated Provider Care Teams.  These Care Teams include your primary Cardiologist (physician) and Advanced Practice Providers (APPs -  Physician Assistants and Nurse Practitioners) who all work together to provide you with the care you need, when you need it. Your next appointment:   12 month(s) The format for your next appointment:   In Person Provider:   You may see Michael Cooper, MD or one of the following Advanced Practice Providers on your designated Care Team:    Scott Weaver, PA-C  [name] Bhagat, PA-C   

## 2020-07-25 NOTE — Progress Notes (Signed)
Cardiology Office Note:    Date:  07/25/2020   ID:  [name], DOB 02-26-1936, MRN [id number]  PCP:  Marin Olp, MD  [facility] Cardiologist:  Sherren Mocha, MD  [place] Electrophysiologist:  None   Referring MD: Marin Olp, MD   Chief Complaint  Patient presents with  . Hypertension    History of Present Illness:    [name] is a 84 y.o. female with a hx of hypertension and hyperlipidemia, presenting for follow-up evaluation.  The patient does have a history of chest pain and she has undergone echo and nuclear stress testing evaluations in 2019.  Her echocardiogram demonstrated normal LV function with no significant valvular abnormalities.  Her nuclear stress test showed an LVEF of 74% with a fixed defect in the basal inferolateral and anteroseptal walls felt to likely be related to attenuation artifact.  The patient was seen in September of this year with chest pain but was felt to be atypical and related to stress/anxiety.  She is here alone today for follow-up.  She did not undergo any testing back in September when she was evaluated.  She is feeling much better and denies any cardiac-related complaints today.  She specifically denies chest pain, chest pressure, or weakness.  She does admit to shortness of breath when walking up her driveway, but states this is unchanged over time.  Otherwise she has no functional limitation.  She denies orthopnea, PND, or heart palpitations.  She reports compliance with her medications.  Past Medical History:  Diagnosis Date  . Anxiety   . Colon polyps   . Complication of anesthesia    takes longer to wake up- admitted overnight after colonoscopy  . Depression   . GERD (gastroesophageal reflux disease)   . Glaucoma   . Headache    migraines  . Hx: UTI (urinary tract infection)   . Hypercholesteremia   . Hypertension   . Lichen simplex chronicus   . Lumbar stenosis with neurogenic claudication   .  Macular degeneration   . Osteoarthritis   . Osteopenia    of the hip  . PONV (postoperative nausea and vomiting)   . Stress incontinence, female   . Unsteady gait   . Wears glasses   . Whooping cough    as a baby    Past Surgical History:  Procedure Laterality Date  . ABDOMINAL HYSTERECTOMY  1977   ovaries remain  . ANTERIOR LAT LUMBAR FUSION N/A 10/26/2017   Procedure: LUMBAR THREE- LUMBAR FOUR, LUMBAR FOUR- LUMBAR FIVE ANTEROLATERAL LUMBAR INTERBODY ARTHRODESIS;  Surgeon: Jovita Gamma, MD;  Location: [place];  Service: Neurosurgery;  Laterality: N/A;  LUMBAR 3- LUMBAR 4, LUMBAR 4- LUMBAR 5 ANTEROLATERAL LUMBAR INTERBODY ARTHRODESIS, LUMBAR 3- LUMBAR 4, LUMBAR 4- LUMBAR 5 PERCUTANEOUS PEDICLE SCREW FIXATION  . APPENDECTOMY    . BLADDER REPAIR     x two  . CHOLECYSTECTOMY    . COLONOSCOPY    . EYE SURGERY  11/14, 12/14   Macular Dengeneration, Glaucoma  . EYE SURGERY  2016   left eye  . LESION REMOVAL Right 06/28/2013   Procedure: EXCISION 3 cm right labial sebaceous cyst;  Surgeon: Lyman Speller, MD;  Location: [facility];  Service: Gynecology;  Laterality: Right;  . LUMBAR LAMINECTOMY/DECOMPRESSION MICRODISCECTOMY Right 03/20/2016   Procedure: Right - Lumbar four-five lumbar laminotomy, foraminotomy, and possible microdiscectomy;  Surgeon: Jovita Gamma, MD;  Location: [place] [facility];  Service: Neurosurgery;  Laterality: Right;  right  .  LUMBAR PERCUTANEOUS PEDICLE SCREW 2 LEVEL  10/26/2017   Procedure: LUMBAR THREE- LUMBAR FOUR, LUMBAR FOUR- LUMBAR FIVE PERCUTANEOUS PEDICLE SCREW FIXATION;  Surgeon: Jovita Gamma, MD;  Location: [place];  Service: Neurosurgery;;  . MOHS SURGERY     procedure to remove basal cell  . TONSILLECTOMY AND ADENOIDECTOMY    . URETHRAL SLING  2007  . URETHRAL SLING  1/12   midurethral   . vertebroplasty secondary to traumatic compression fracture      Current Medications: Current Meds  Medication Sig  . aspirin 81 MG tablet Take 81 mg by  mouth daily.  . brimonidine-timolol (COMBIGAN) 0.2-0.5 % ophthalmic solution Place 1 drop into both eyes every 12 (twelve) hours.  Marland Kitchen ezetimibe-simvastatin (VYTORIN) 10-20 MG tablet Take 1 tablet by mouth daily.  . nadolol (CORGARD) 40 MG tablet TAKE 1 TABLET(40 MG) BY MOUTH AT BEDTIME  . nitroGLYCERIN (NITROSTAT) 0.4 MG SL tablet Place 1 tablet (0.4 mg total) under the tongue every 5 (five) minutes as needed.  . nystatin ointment (MYCOSTATIN) APPLY TO THE AFFECTED AREA 1 TO 2 TIMES A DAY. USE FOR UP TO 10 DAYS  . omeprazole (PRILOSEC) 40 MG capsule TAKE 1 CAPSULE(40 MG) BY MOUTH DAILY  . sodium chloride (OCEAN) 0.65 % SOLN nasal spray Place 1 spray into both nostrils 2 (two) times daily as needed for congestion.  . traZODone (DESYREL) 100 MG tablet TAKE 1/4 TO 1/2 TABLET BY MOUTH AT BEDTIME IF NEEDED FOR SLEEP  . venlafaxine XR (EFFEXOR-XR) 150 MG 24 hr capsule TAKE 1 CAPSULE(150 MG) BY MOUTH TWICE DAILY     Allergies:   Citrate of magnesia, Codeine, and Shingrix [zoster vac recomb adjuvanted]   Social History   Socioeconomic History  . Marital status: Married    Spouse name: Not on file  . Number of children: 2  . Years of education: [employer]  . Highest education level: Not on file  Occupational History  . Occupation: Retired  Tobacco Use  . Smoking status: Former Smoker    Quit date: 08/11/1965    Years since quitting: 54.9  . Smokeless tobacco: Never Used  . Tobacco comment: has not smoked in 45 years   Vaping Use  . Vaping Use: Never used  Substance and Sexual Activity  . Alcohol use: Not Currently    Alcohol/week: 1.0 standard drink    Types: 1 Standard drinks or equivalent per week    Comment: occ   . Drug use: No  . Sexual activity: Not Currently    Partners: Male    Birth control/protection: Surgical    Comment: TAH  Other Topics Concern  . Not on file  Social History Narrative   HSG, attended [employer]   Married '65   1 son '67, 1 Daughter '69; 5  grandchildren. 1 grandson dealing with drugs after divorce of parents. Oldest.    Work: retired Pharmacist, hospital   Marriage in good health   Former smoker   Right-handed   Caffeine: 11/2 cups per day   Social Determinants of Health   Financial Resource Strain: Low Risk   . Difficulty of Paying Living Expenses: Not hard at all  Food Insecurity: No Food Insecurity  . Worried About Charity fundraiser in the Last Year: Never true  . Ran Out of Food in the Last Year: Never true  Transportation Needs: No Transportation Needs  . Lack of Transportation (Medical): No  . Lack of Transportation (Non-Medical): No  Physical Activity: Inactive  .  Days of Exercise per Week: 0 days  . Minutes of Exercise per Session: 0 min  Stress: No Stress Concern Present  . Feeling of Stress : Only a little  Social Connections: Moderately Integrated  . Frequency of Communication with Friends and Family: More than three times a week  . Frequency of Social Gatherings with Friends and Family: Three times a week  . Attends Religious Services: More than 4 times per year  . Active Member of Clubs or Organizations: No  . Attends Archivist Meetings: Never  . Marital Status: Married     Family History: The patient's family history includes Alcoholism in her mother; Breast cancer in her maternal grandmother; Cancer in her sister; Congestive Heart Failure in her mother; Hypertension in her sister; Osteoporosis in her mother; Prostate cancer in her father; Rheum arthritis in her daughter; Thyroid disease in her mother and sister.  ROS:   Please see the history of present illness.    All other systems reviewed and are negative.  EKGs/Labs/Other Studies Reviewed:    The following studies were reviewed today: Echo 01/24/2018: Study Conclusions   - Left ventricle: The cavity size was normal. There was mild  concentric hypertrophy. Systolic function was normal. The  estimated ejection fraction was in the range  of 60% to 65%. Wall  motion was normal; there were no regional wall motion  abnormalities. Doppler parameters are consistent with abnormal  left ventricular relaxation (grade 1 diastolic dysfunction).  Doppler parameters are consistent with indeterminate ventricular  filling pressure.  - Aortic valve: Transvalvular velocity was within the normal range.  There was no stenosis. There was trivial regurgitation.  - Mitral valve: Transvalvular velocity was within the normal range.  There was no evidence for stenosis. There was trivial  regurgitation.  - Right ventricle: The cavity size was normal. Wall thickness was  normal. Systolic function was normal.  - Tricuspid valve: There was mild regurgitation.  - Pulmonary arteries: Systolic pressure was within the normal  range. PA peak pressure: 31 mm Hg (S).  Myoview 02/06/2020: Study Highlights   Nuclear stress EF: 74%. The left ventricular ejection fraction is hyperdynamic (>65%).  Defect 1: There is a medium defect of moderate severity present in the basal inferolateral, mid anteroseptal, mid inferolateral and apical lateral location. This is most likely attenuation artifact. This area contracts fairly well. I cannot rule out a previous septal MI .  This is a low risk study.   EKG:  EKG is not ordered today.    Recent Labs: 05/11/2020: ALT 11; BUN 16; Creat 0.78; Hemoglobin 14.7; Platelets 221; Potassium 4.4; Sodium 139  Recent Lipid Panel    Component Value Date/Time   CHOL 144 01/25/2018 0432   TRIG 125 01/25/2018 0432   HDL 41 01/25/2018 0432   CHOLHDL 3.5 01/25/2018 0432   VLDL 25 01/25/2018 0432   LDLCALC 78 01/25/2018 0432   LDLDIRECT 84.0 03/06/2017 1402     Risk Assessment/Calculations:       Physical Exam:    VS:  BP 140/80   Pulse 79   Ht 5\' 3"  (1.6 m)   Wt 161 lb 6.4 oz (73.2 kg)   LMP 08/11/1974   SpO2 99%   BMI 28.59 kg/m     Wt Readings from Last 3 Encounters:  07/25/20 161 lb 6.4  oz (73.2 kg)  06/18/20 159 lb (72.1 kg)  05/11/20 155 lb (70.3 kg)     GEN:  Well nourished, well developed in no  acute distress HEENT: Normal NECK: No JVD; No carotid bruits LYMPHATICS: No lymphadenopathy CARDIAC: RRR, no murmurs, rubs, gallops RESPIRATORY:  Clear to auscultation without rales, wheezing or rhonchi  ABDOMEN: Soft, non-tender, non-distended MUSCULOSKELETAL:  No edema; No deformity  SKIN: Warm and dry NEUROLOGIC:  Alert and oriented x 3 PSYCHIATRIC:  Normal affect   ASSESSMENT:    1. Essential hypertension   2. Mixed hyperlipidemia    PLAN:    In order of problems listed above:  1. Blood pressure controlled on nadolol.  Most recent labs reviewed. 2. Lipids last checked in 2019.  Her ALT was recently checked and it is within normal limits.  We will update her lipid panel.  Would not anticipate any changes unless worse and a significant difference in her lipids compared to previous studies.  She is currently treated with Vytorin 10/20 mg daily.  Medication Adjustments/Labs and Tests Ordered: Current medicines are reviewed at length with the patient today.  Concerns regarding medicines are outlined above.  No orders of the defined types were placed in this encounter.  No orders of the defined types were placed in this encounter.   There are no Patient Instructions on file for this visit.   Signed, Sherren Mocha, MD  07/25/2020 2:09 PM    [facility]

## 2020-07-25 NOTE — Telephone Encounter (Signed)
..   LAST APPOINTMENT DATE: 07/11/2020   NEXT APPOINTMENT DATE:@Visit  date not found  MEDICATION:HYDROcodone-acetaminophen (NORCO/VICODIN) 5-325 MG tablet

## 2020-08-14 ENCOUNTER — Ambulatory Visit: Payer: Medicare HMO | Admitting: Podiatry

## 2020-08-16 ENCOUNTER — Other Ambulatory Visit: Payer: Medicare HMO

## 2020-08-16 DIAGNOSIS — Z20822 Contact with and (suspected) exposure to covid-19: Secondary | ICD-10-CM

## 2020-08-20 LAB — NOVEL CORONAVIRUS, NAA: SARS-CoV-2, NAA: NOT DETECTED

## 2020-10-12 ENCOUNTER — Telehealth: Payer: Self-pay

## 2020-10-12 NOTE — Telephone Encounter (Signed)
[name] called in stating that she has been having vaginal itching for about 2-3 weeks now. Her GYN Dr.Miller has moved her practice and she hasn't been able to see her new GYN yet. She would like to know if you could write her a new script for her clobetasol cream. (It is on her med list but just not current)

## 2020-10-13 NOTE — Telephone Encounter (Signed)
May refill 1x but she should get into gynecology for next refill please

## 2020-10-15 ENCOUNTER — Other Ambulatory Visit: Payer: Self-pay

## 2020-10-15 MED ORDER — CLOBETASOL PROPIONATE 0.05 % EX OINT: TOPICAL_OINTMENT | CUTANEOUS | 0 refills | Status: DC

## 2020-10-15 NOTE — Telephone Encounter (Signed)
Rx filled

## 2020-10-16 ENCOUNTER — Telehealth: Payer: Self-pay

## 2020-10-16 ENCOUNTER — Other Ambulatory Visit: Payer: Self-pay

## 2020-10-16 DIAGNOSIS — R300 Dysuria: Secondary | ICD-10-CM

## 2020-10-16 NOTE — Telephone Encounter (Signed)
Patient's husband came in and states that she is having some pain with urination. This has been going on for a while now but it's getting worst here lately. She's having some burning with urination, frequent urination, she does wear depends during the day. I am placing a UA and a urine culture.

## 2020-10-17 ENCOUNTER — Other Ambulatory Visit: Payer: Self-pay | Admitting: Family Medicine

## 2020-10-18 ENCOUNTER — Other Ambulatory Visit (INDEPENDENT_AMBULATORY_CARE_PROVIDER_SITE_OTHER): Payer: Medicare HMO

## 2020-10-18 DIAGNOSIS — R300 Dysuria: Secondary | ICD-10-CM

## 2020-10-20 LAB — URINE CULTURE
MICRO NUMBER:: 11632344
SPECIMEN QUALITY:: ADEQUATE

## 2020-10-22 ENCOUNTER — Other Ambulatory Visit: Payer: Self-pay

## 2020-10-22 MED ORDER — CEPHALEXIN 500 MG PO CAPS: 500.0000 mg | ORAL_CAPSULE | Freq: Three times a day (TID) | ORAL | 0 refills | Status: DC

## 2020-11-15 ENCOUNTER — Telehealth: Payer: Self-pay

## 2020-11-15 ENCOUNTER — Other Ambulatory Visit: Payer: Self-pay

## 2020-11-15 DIAGNOSIS — R300 Dysuria: Secondary | ICD-10-CM

## 2020-11-15 NOTE — Telephone Encounter (Signed)
.   LAST APPOINTMENT DATE: 10/22/2020   NEXT APPOINTMENT DATE:@9 /15/2022  MEDICATION: cephALEXin (KEFLEX) 500 MG capsule  [id number] [place] [place] - [address]

## 2020-11-15 NOTE — Telephone Encounter (Signed)
Called and spoke with pt to see if she infact needs this and she states that she is having burning and frequent urination, she gets these a lot and knows when one is coming. I asked pt could she come leave a sample/OV and she states her dog died and she does want to come for a visit how she is. Husband states that he can drop off a sample tomorrow but could you please fill this this afternoon for pt, I adivsed that a visit may be necessary to make sure that she will be placed on appropriate antibiotic. I have placed future dipstick and culture order.

## 2020-11-15 NOTE — Telephone Encounter (Signed)
Updated [name] that she can send keflex in once these are collected

## 2020-11-15 NOTE — Telephone Encounter (Signed)
I need tests to be collected before we send this in. Please message me back once she has collected these. I would prefer at least a video visit but trying to be understanding given dog situation

## 2020-11-16 ENCOUNTER — Other Ambulatory Visit: Payer: Self-pay

## 2020-11-16 ENCOUNTER — Other Ambulatory Visit (INDEPENDENT_AMBULATORY_CARE_PROVIDER_SITE_OTHER): Payer: Medicare HMO

## 2020-11-16 DIAGNOSIS — R300 Dysuria: Secondary | ICD-10-CM

## 2020-11-16 LAB — POCT URINALYSIS DIPSTICK
Bilirubin, UA: NEGATIVE
Glucose, UA: NEGATIVE
Ketones, UA: NEGATIVE
Nitrite, UA: NEGATIVE
Protein, UA: NEGATIVE
Spec Grav, UA: 1.02 (ref 1.010–1.025)
Urobilinogen, UA: 1 E.U./dL
pH, UA: 5.5 (ref 5.0–8.0)

## 2020-11-16 MED ORDER — CEPHALEXIN 500 MG PO CAPS
500.0000 mg | ORAL_CAPSULE | Freq: Three times a day (TID) | ORAL | 0 refills | Status: DC
Start: 2020-11-16 — End: 2021-04-28

## 2020-11-16 MED ORDER — CEPHALEXIN 500 MG PO CAPS
500.0000 mg | ORAL_CAPSULE | Freq: Three times a day (TID) | ORAL | 0 refills | Status: DC
Start: 2020-11-16 — End: 2020-11-16

## 2020-11-16 NOTE — Telephone Encounter (Signed)
Urine has been collected. Orders has been placed and released. Medication has been sent to the pharmacy.

## 2020-11-16 NOTE — Addendum Note (Signed)
Addended by: Jacob Moores on: 11/16/2020 12:37 PM   Modules accepted: Orders

## 2020-11-18 LAB — URINE CULTURE
MICRO NUMBER:: 11748313
SPECIMEN QUALITY:: ADEQUATE

## 2020-11-19 ENCOUNTER — Other Ambulatory Visit: Payer: Self-pay | Admitting: Family Medicine

## 2020-11-20 NOTE — Telephone Encounter (Signed)
Last Refill 06/12/2020 Last OV 06/18/2020 dx Dysuria

## 2020-11-26 ENCOUNTER — Telehealth: Payer: Self-pay | Admitting: Family Medicine

## 2020-11-26 NOTE — Chronic Care Management (AMB) (Signed)
  Chronic Care Management   Note  11/26/2020 Name: [name] MRN: [id number] DOB: May 17, 1936  [name] is a 85 y.o. year old female who is a primary care patient of Marin Olp, MD. I reached out to Alfonzo Beers by phone today in response to a referral sent by [name]'s PCP, Marin Olp, MD.   [name] was given information about Chronic Care Management services today including:  1. CCM service includes personalized support from designated clinical staff supervised by her physician, including individualized plan of care and coordination with other care providers 2. 24/7 contact phone numbers for assistance for urgent and routine care needs. 3. Service will only be billed when office clinical staff spend 20 minutes or more in a month to coordinate care. 4. Only one practitioner may furnish and bill the service in a calendar month. 5. The patient may stop CCM services at any time (effective at the end of the month) by phone call to the office staff.   Patient agreed to services and verbal consent obtained.   Follow up plan:   Lauretta Grill Upstream Scheduler

## 2020-12-04 DIAGNOSIS — N3020 Other chronic cystitis without hematuria: Secondary | ICD-10-CM | POA: Diagnosis not present

## 2020-12-04 DIAGNOSIS — R8271 Bacteriuria: Secondary | ICD-10-CM | POA: Diagnosis not present

## 2020-12-04 DIAGNOSIS — N3021 Other chronic cystitis with hematuria: Secondary | ICD-10-CM | POA: Diagnosis not present

## 2020-12-07 ENCOUNTER — Telehealth: Payer: Self-pay

## 2020-12-07 NOTE — Chronic Care Management (AMB) (Signed)
Chronic Care Management Pharmacy Assistant   Name: [name]  MRN: [id number] DOB: [birth year]  Reason for Encounter: Chart Prep    Recent office visits:  11/15/20- Marin Olp, MD( TE)- messages noted short course keflex given after collected UA 06/18/20- Concern for UTI, flu shot given, follow up 4 months   Recent consult visits:  07/25/20- Sherren Mocha, MD (Cardio)- hypertension follow up appt, labs ordered, follow up 1 yr  Hospital visits:  None in previous 6 months  Medications: Outpatient Encounter Medications as of 12/07/2020  Medication Sig  . ALPRAZolam (XANAX) 0.5 MG tablet TAKE 1/2 TO 1 TABLET(0.25 TO 0.5 MG) BY MOUTH TWICE DAILY AS NEEDED FOR ANXIETY  . aspirin 81 MG tablet Take 81 mg by mouth daily.  . brimonidine-timolol (COMBIGAN) 0.2-0.5 % ophthalmic solution Place 1 drop into both eyes every 12 (twelve) hours.  . cephALEXin (KEFLEX) 500 MG capsule Take 1 capsule (500 mg total) by mouth 3 (three) times daily.  . clobetasol ointment (TEMOVATE) 0.05 % apply to affected area twice a day if needed DO NOT USE FOR MORE THAN 5 DAYS  . ezetimibe-simvastatin (VYTORIN) 10-20 MG tablet Take 1 tablet by mouth daily.  Marland Kitchen HYDROcodone-acetaminophen (NORCO/VICODIN) 5-325 MG tablet Take 1 tablet by mouth every 6 (six) hours as needed for moderate pain or severe pain.  . nadolol (CORGARD) 40 MG tablet TAKE 1 TABLET(40 MG) BY MOUTH AT BEDTIME  . nitroGLYCERIN (NITROSTAT) 0.4 MG SL tablet Place 1 tablet (0.4 mg total) under the tongue every 5 (five) minutes as needed.  . nystatin ointment (MYCOSTATIN) APPLY TO THE AFFECTED AREA 1 TO 2 TIMES A DAY. USE FOR UP TO 10 DAYS  . omeprazole (PRILOSEC) 40 MG capsule TAKE 1 CAPSULE(40 MG) BY MOUTH DAILY  . sodium chloride (OCEAN) 0.65 % SOLN nasal spray Place 1 spray into both nostrils 2 (two) times daily as needed for congestion.  . traZODone (DESYREL) 100 MG tablet TAKE 1/4 TO 1/2 TABLET BY MOUTH AT BEDTIME IF NEEDED FOR SLEEP  .  venlafaxine XR (EFFEXOR-XR) 150 MG 24 hr capsule TAKE 1 CAPSULE BY MOUTH TWICE A DAY   No facility-administered encounter medications on file as of 12/07/2020.   Current Documented Medications  Alprazolam 0.5 mg- 10 DS 06/12/20 PRN Asprin 81 mg- AM  brimonidine-timolol (COMBIGAN) 0.2-0.5 % ophthalmic solution-60 DS last filled 10/01/20 clobetasol ointment (TEMOVATE) 0.05 %- 14 DS last filled 10/16/20 -Patient reported not taking  ezetimibe-simvastatin (VYTORIN) 10-20 MG tablet- 90 DS last filled 10/14/20 HYDROcodone-acetaminophen  5-325 MG tablet- 5 DS last filled 07/25/20-Patient reported not taking  Nadolol 40 mg- 90 Ds last filled 10/16/20 Nitroglycerin 0.4 mg-8 DS last filled 05/03/20 Nystatin 30 gm- 10 DS last filled 01/18/20- Patient reported not taking  Omeprazole 40 mg- Receives from [employer] sodium chloride (OCEAN) 0.65 % SOLN nasal spray traZODone (DESYREL) 100 MG tablet- 90 DS last filled 10/05/20 venlafaxine XR (EFFEXOR-XR) 150 MG 24 hr capsule BID 12/07/20 [facility] Multivitamin B-12 . Dr. Lovena Neighbours prescribed  01% Estradiol Cream three times a week 08/08/20 . Pt stated this doesn't help and she does not like using this.   This morning [name] was on her way to do errands, but we were able to have a lengthy conversation prior to her CPP visit. She has been doing well overall with her  health and had no new concerning ailments. She lives with her husband, [name], who has cancer and had kidney surgery in 2021. [name] herself had  spine/back  surgery 2018- 2019 which contributes to her back pains. Even though it does not take away from her ability to be active, she sometimes experiences back pains when she is bending in which she uses biofreeze to help  her sxs. Regardless of her ability to remain active she stated she has not done much outside of her house since Covid-19 began. She does not feel comfortable exposing herself or her husband to the pandemic. [name] does  however have a beach trip planned for 05/07 with her and her husband. She doesn't like the beach because of the water and sun, but says her enjoyment comes when her husband enjoys the beach. We went over an extensive list of foods that she and her husband eat. This includes: pizza, cheese, hamburgers, chili, ham, macaroni, canned soup, spaghetti, bagels, rice in a bag, breads, steaks, processed potatoes chips,coffee, coca- cola, energy drinks, etc. She also makes ready-made cookies every 4-5 days as a part of a consistent dessert.   Have you seen any other providers since your last visit? Patient has not seen any other providers  Any changes in your medications or health?  Patient stated no changes in medications  Any side effects from any medications?  Patient stated no current side effects   Do you have an symptoms or problems not managed by your medications?  Patient stated no current unmanaged symptoms  Any concerns about your health right now?  Patient stated she has no concerns  Has your provider asked that you check blood pressure, blood sugar, or follow special diet at home? Patent stated she has not been asked to keep track of any readings or diet.  Do you get any type of exercise on a regular basis?  Patient does not remain consistently active   Can you think of a goal you would like to reach for your health?  Patient could not think of a goal at the moment  Do you have any problems getting your medications?  Patient stated she currently has no problems getting her medications.  Is there anything that you would like to discuss during the appointment? Patient did not have anything to discuss.   Please bring medications and supplements to appointment Reminded patient of initial phone visit with CPP on 05/04 at 2 pm    [place], [place]

## 2020-12-11 DIAGNOSIS — H401123 Primary open-angle glaucoma, left eye, severe stage: Secondary | ICD-10-CM | POA: Diagnosis not present

## 2020-12-11 DIAGNOSIS — H5203 Hypermetropia, bilateral: Secondary | ICD-10-CM | POA: Diagnosis not present

## 2020-12-11 DIAGNOSIS — Z961 Presence of intraocular lens: Secondary | ICD-10-CM | POA: Diagnosis not present

## 2020-12-11 DIAGNOSIS — H401111 Primary open-angle glaucoma, right eye, mild stage: Secondary | ICD-10-CM | POA: Diagnosis not present

## 2020-12-12 ENCOUNTER — Ambulatory Visit (INDEPENDENT_AMBULATORY_CARE_PROVIDER_SITE_OTHER): Payer: Medicare HMO

## 2020-12-12 ENCOUNTER — Telehealth: Payer: Medicare HMO

## 2020-12-12 DIAGNOSIS — I10 Essential (primary) hypertension: Secondary | ICD-10-CM | POA: Diagnosis not present

## 2020-12-12 DIAGNOSIS — F325 Major depressive disorder, single episode, in full remission: Secondary | ICD-10-CM

## 2020-12-12 NOTE — Patient Instructions (Addendum)
[name],  Thank you for talking with me today. I have included our care plan/goals in the following pages.   Please review and call me at [phone] with any questions.  Thanks! Ellin Mayhew, PharmD, CPP Clinical Pharmacist Practitioner  [facility]  [phone]  Patient Care Plan: CCM Pharmacy Care Plan    Problem Identified: HLD HTN OA Stress incontinence GERD MDD   Priority: High    Long-Range Goal: Disease Management   Start Date: 12/12/2020  Expected End Date: 12/12/2021  This Visit's Progress: On track  Priority: High  Note:   Current Barriers:  . n/a  Pharmacist Clinical Goal(s):  Marland Kitchen Patient will contact provider office for questions/concerns as evidenced notation of same in electronic health record through collaboration with PharmD and provider.   Interventions: . 1:1 collaboration with Marin Olp, MD regarding development and update of comprehensive plan of care as evidenced by provider attestation and co-signature . Inter-disciplinary care team collaboration (see longitudinal plan of care) . Comprehensive medication review performed; medication list updated in electronic medical record . No Rx changes  Hypertension (BP goal <130/80) -Controlled -Current treatment: . 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 No recent falls reported by patient.  -Educated on Symptoms of hypotension and importance of maintaining adequate hydration; -Counseled to monitor BP at home, document, and provide log at future appointments -Recommended to continue current medication  Hyperlipidemia: (LDL goal < 100) -Controlled -On asa daily - denies CAD/Stroke Hx -Current treatment:  Vytorin 10-20 mg once daily -Review of side effects - no problems noted -Educated on Cholesterol  goals;  -Recommended to continue current medication  Osteopenia (Goal minimize symptoms) -Controlled -Last DEXA Scan: 2020 -Patient is not a candidate for pharmacologic treatment -Current treatment  . Will start Vitamin D3 1000 units -Medications previously tried:   -Recommend [phone] units of vitamin D daily. Recommend weight-bearing and muscle strengthening exercises for building and maintaining bone density.  MDD (Goal: ensure medication) -Controlled -Feels depression has been well controlled, no side effects. Pain management has been reasonable following spinal surgery 2019, knows not to over do it now. Current treatment  . Venlafaxine XR 150 mg twice daily  -Reviewed side effects - no problems noted, sleep has been good as long as trazodone 25 mg is being used -Recommended to continue current medication  Patient Goals/Self-Care Activities . Patient will:  - target a minimum of 150 minutes of moderate intensity exercise weekly  Follow Up Plan: [place] f/u telephone visit 6-8 months. 3 months gen/adh CPA call. Medication Assistance: None required.  Patient affirms current coverage meets needs.      The patient was given the following information about Chronic Care Management services today, agreed to services, and gave verbal consent: 1. CCM service includes personalized support from designated clinical staff supervised by the primary care provider, including individualized plan of care and coordination with other care providers 2. 24/7 contact phone numbers for assistance for urgent and routine care needs. 3. Service will only be billed when office clinical staff spend 20 minutes or more in a month to coordinate care. 4. Only one practitioner may furnish and bill the service in a calendar month. 5.The patient may stop CCM services at any time (effective at the end of the month) by phone call to the office staff. 6.  The patient will be responsible for cost sharing (co-pay) of up to 20% of  the service fee (after annual deductible is met). Patient agreed to services and consent obtained.  The patient verbalized understanding of instructions provided today and agreed to receive a MyChart copy of patient instruction and/or educational materials. Telephone follow up appointment with pharmacy team member scheduled for: See next appointment with "Care Management Staff" under "What's Next" below.   Hypertension, Adult High blood pressure (hypertension) is when the force of blood pumping through the arteries is too strong. The arteries are the blood vessels that carry blood from the heart throughout the body. Hypertension forces the heart to work harder to pump blood and may cause arteries to become narrow or stiff. Untreated or uncontrolled hypertension can cause a heart attack, heart failure, a stroke, kidney disease, and other problems. A blood pressure reading consists of a higher number over a lower number. Ideally, your blood pressure should be below 120/80. The first ("top") number is called the systolic pressure. It is a measure of the pressure in your arteries as your heart beats. The second ("bottom") number is called the diastolic pressure. It is a measure of the pressure in your arteries as the heart relaxes. What are the causes? The exact cause of this condition is not known. There are some conditions that result in or are related to high blood pressure. What increases the risk? Some risk factors for high blood pressure are under your control. The following factors may make you more likely to develop this condition:  Smoking.  Having type 2 diabetes mellitus, high cholesterol, or both.  Not getting enough exercise or physical activity.  Being overweight.  Having too much fat, sugar, calories, or salt (sodium) in your diet.  Drinking too much alcohol. Some risk factors for high blood pressure may be difficult or impossible to change. Some of these factors include:  Having  chronic kidney disease.  Having a family history of high blood pressure.  Age. Risk increases with age.  Race. You may be at higher risk if you are African American.  Gender. Men are at higher risk than women before age 29. After age 73, women are at higher risk than men.  Having obstructive sleep apnea.  Stress. What are the signs or symptoms? High blood pressure may not cause symptoms. Very high blood pressure (hypertensive crisis) may cause:  Headache.  Anxiety.  Shortness of breath.  Nosebleed.  Nausea and vomiting.  Vision changes.  Severe chest pain.  Seizures. How is this diagnosed? This condition is diagnosed by measuring your blood pressure while you are seated, with your arm resting on a flat surface, your legs uncrossed, and your feet flat on the floor. The cuff of the blood pressure monitor will be placed directly against the skin of your upper arm at the level of your heart. It should be measured at least twice using the same arm. Certain conditions can cause a difference in blood pressure between your right and left arms. Certain factors can cause blood pressure readings to be lower or higher than normal for a short period of time:  When your blood pressure is higher when you are in a health care provider's office than when you are at home, this is called white coat hypertension. Most people with this condition do not need medicines.  When your blood pressure is higher at home than when you are in a health care provider's office, this is called masked hypertension.  Most people with this condition may need medicines to control blood pressure. If you have a high blood pressure reading during one visit or you have normal blood pressure with other risk factors, you may be asked to:  Return on a different day to have your blood pressure checked again.  Monitor your blood pressure at home for 1 week or longer. If you are diagnosed with hypertension, you may have  other blood or imaging tests to help your health care provider understand your overall risk for other conditions. How is this treated? This condition is treated by making healthy lifestyle changes, such as eating healthy foods, exercising more, and reducing your alcohol intake. Your health care provider may prescribe medicine if lifestyle changes are not enough to get your blood pressure under control, and if:  Your systolic blood pressure is above 130.  Your diastolic blood pressure is above 80. Your personal target blood pressure may vary depending on your medical conditions, your age, and other factors. Follow these instructions at home: Eating and drinking  Eat a diet that is high in fiber and potassium, and low in sodium, added sugar, and fat. An example eating plan is called the DASH (Dietary Approaches to Stop Hypertension) diet. To eat this way: ? Eat plenty of fresh fruits and vegetables. Try to fill one half of your plate at each meal with fruits and vegetables. ? Eat whole grains, such as whole-wheat pasta, brown rice, or whole-grain bread. Fill about one fourth of your plate with whole grains. ? Eat or drink low-fat dairy products, such as skim milk or low-fat yogurt. ? Avoid fatty cuts of meat, processed or cured meats, and poultry with skin. Fill about one fourth of your plate with lean proteins, such as fish, chicken without skin, beans, eggs, or tofu. ? Avoid pre-made and processed foods. These tend to be higher in sodium, added sugar, and fat.  Reduce your daily sodium intake. Most people with hypertension should eat less than 1,500 mg of sodium a day.  Do not drink alcohol if: ? Your health care provider tells you not to drink. ? You are pregnant, may be pregnant, or are planning to become pregnant.  If you drink alcohol: ? Limit how much you use to:  0-1 drink a day for women.  0-2 drinks a day for men. ? Be aware of how much alcohol is in your drink. In the [place] one  drink equals one 12 oz bottle of beer (355 mL), one 5 oz glass of wine (148 mL), or one 1 oz glass of hard liquor (44 mL).   Lifestyle  Work with your health care provider to maintain a healthy body weight or to lose weight. Ask what an ideal weight is for you.  Get at least 30 minutes of exercise most days of the week. Activities may include walking, swimming, or biking.  Include exercise to strengthen your muscles (resistance exercise), such as Pilates or lifting weights, as part of your weekly exercise routine. Try to do these types of exercises for 30 minutes at least 3 days a week.  Do not use any products that contain nicotine or tobacco, such as cigarettes, e-cigarettes, and chewing tobacco. If you need help quitting, ask your health care provider.  Monitor your blood pressure at home as told by your health care provider.  Keep all follow-up visits as told by your health care provider. This is important.   Medicines  Take over-the-counter and prescription medicines only as  told by your health care provider. Follow directions carefully. Blood pressure medicines must be taken as prescribed.  Do not skip doses of blood pressure medicine. Doing this puts you at risk for problems and can make the medicine less effective.  Ask your health care provider about side effects or reactions to medicines that you should watch for. Contact a health care provider if you:  Think you are having a reaction to a medicine you are taking.  Have headaches that keep coming back (recurring).  Feel dizzy.  Have swelling in your ankles.  Have trouble with your vision. Get help right away if you:  Develop a severe headache or confusion.  Have unusual weakness or numbness.  Feel faint.  Have severe pain in your chest or abdomen.  Vomit repeatedly.  Have trouble breathing. Summary  Hypertension is when the force of blood pumping through your arteries is too strong. If this condition is not  controlled, it may put you at risk for serious complications.  Your personal target blood pressure may vary depending on your medical conditions, your age, and other factors. For most people, a normal blood pressure is less than 120/80.  Hypertension is treated with lifestyle changes, medicines, or a combination of both. Lifestyle changes include losing weight, eating a healthy, low-sodium diet, exercising more, and limiting alcohol. This information is not intended to replace advice given to you by your health care provider. Make sure you discuss any questions you have with your health care provider. Document Revised: 04/07/2018 Document Reviewed: 04/07/2018 Elsevier Patient Education  2021 [employer].

## 2020-12-12 NOTE — Progress Notes (Signed)
Chronic Care Management Pharmacy Note  12/12/2020 Name:  [name] MRN:  [id number] DOB:  [birth year]  Subjective: [name] is an 85 y.o. year old female who is a primary patient of Hunter, Brayton Mars, MD.  The CCM team was consulted for assistance with disease management and care coordination needs.    Engaged with patient by telephone for initial visit in response to provider referral for pharmacy case management and/or care coordination services.   Consent to Services:  The patient was given the following information about Chronic Care Management services today, agreed to services, and gave verbal consent: 1. CCM service includes personalized support from designated clinical staff supervised by the primary care provider, including individualized plan of care and coordination with other care providers 2. 24/7 contact phone numbers for assistance for urgent and routine care needs. 3. Service will only be billed when office clinical staff spend 20 minutes or more in a month to coordinate care. 4. Only one practitioner may furnish and bill the service in a calendar month. 5.The patient may stop CCM services at any time (effective at the end of the month) by phone call to the office staff. 6. The patient will be responsible for cost sharing (co-pay) of up to 20% of the service fee (after annual deductible is met). Patient agreed to services and consent obtained.  Patient Care Team: Marin Olp, MD as PCP - General (Family Medicine) Sherren Mocha, MD as PCP - Cardiology (Cardiology) Madelin Rear, [facility] as Pharmacist (Pharmacist)  Objective: Lab Results  Component Value Date   CREATININE 0.78 05/11/2020   CREATININE 0.76 01/24/2018   CREATININE 0.72 11/10/2017    Lab Results  Component Value Date   HGBA1C 5.2 01/25/2018   Last diabetic Eye exam: No results found for: HMDIABEYEEXA  Last diabetic Foot exam: No results found for: HMDIABFOOTEX      Component Value  Date/Time   CHOL 144 01/25/2018 0432   TRIG 125 01/25/2018 0432   HDL 41 01/25/2018 0432   CHOLHDL 3.5 01/25/2018 0432   VLDL 25 01/25/2018 0432   LDLCALC 78 01/25/2018 0432   LDLDIRECT 84.0 03/06/2017 1402    Hepatic Function Latest Ref Rng & Units 05/11/2020 11/10/2017 10/30/2017  Total Protein 6.1 - 8.1 g/dL 6.7 6.7 6.9  Albumin 3.5 - 5.2 g/dL - 3.8 3.3(L)  AST 10 - 35 U/L 16 19 40  ALT 6 - 29 U/L _0 Alk Phosphatase 39 - 117 U/L - 151(H) 64  Total Bilirubin 0.2 - 1.2 mg/dL 0.4 0.5 1.1  Bilirubin, Direct 0.0 - 0.3 mg/dL - - -    Lab Results  Component Value Date/Time   TSH 4.56 (H) 03/06/2017 02:02 PM   TSH 2.85 03/03/2013 10:01 AM    CBC Latest Ref Rng & Units 05/11/2020 01/24/2018 11/10/2017  WBC 3.8 - 10.8 Thousand/uL 9.3 6.7 7.4  Hemoglobin 11.7 - 15.5 g/dL 14.7 14.0 13.4  Hematocrit 35.0 - 45.0 % 43.5 43.8 39.2  Platelets 140 - 400 Thousand/uL 221 239 288.0    No results found for: VD25OH  Clinical ASCVD:  The ASCVD Risk score Mikey Bussing DC Jr., et al., 2013) failed to calculate for the following reasons:   The 2013 ASCVD risk score is only valid for ages 74 to 50    Social History   Tobacco Use  Smoking Status Former Smoker  . Quit date: 08/11/1965  . Years since quitting: 55.3  Smokeless Tobacco Never Used  Tobacco  Comment   has not smoked in 45 years    BP Readings from Last 3 Encounters:  07/25/20 140/80  06/18/20 128/76  05/11/20 (!) 108/46   Pulse Readings from Last 3 Encounters:  07/25/20 79  06/18/20 74  05/11/20 60   Wt Readings from Last 3 Encounters:  07/25/20 161 lb 6.4 oz (73.2 kg)  06/18/20 159 lb (72.1 kg)  05/11/20 155 lb (70.3 kg)   Assessment: Review of patient past medical history, allergies, medications, health status, including review of consultants reports, laboratory and other test data, was performed as part of comprehensive evaluation and provision of chronic care management services.   SDOH:  (Social Determinants of Health)  assessments and interventions performed: Yes  CCM Care Plan  Allergies  Allergen Reactions  . Citrate Of Magnesia Other (See Comments)    confusion  . Codeine Nausea And Vomiting  . Shingrix [Zoster Vac Recomb Adjuvanted] Swelling and Rash    Rash and temp x several days. (low grade 100 -101 )     Medications Reviewed Today    Reviewed by Sherren Mocha, MD (Physician) on 07/25/20 at [place] List Status: <None>  Medication Order Taking? Sig Documenting Provider Last Dose Status Informant  aspirin 81 MG tablet [id number] Yes Take 81 mg by mouth daily. [provider] Taking Active Self  brimonidine-timolol (COMBIGAN) 0.2-0.5 % ophthalmic solution [id number] Yes Place 1 drop into both eyes every 12 (twelve) hours. [provider] Taking Active Self  ezetimibe-simvastatin (VYTORIN) 10-20 MG tablet [id number] Yes Take 1 tablet by mouth daily. Marin Olp, MD Taking Active   nadolol (CORGARD) 40 MG tablet [id number] Yes TAKE 1 TABLET(40 MG) BY MOUTH AT BEDTIME Marin Olp, MD Taking Active   nitroGLYCERIN (NITROSTAT) 0.4 MG SL tablet [id number] Yes Place 1 tablet (0.4 mg total) under the tongue every 5 (five) minutes as needed. [place], Crista Luria, PA Taking Active   nystatin ointment (MYCOSTATIN) [id number] Yes APPLY TO THE AFFECTED AREA 1 TO 2 TIMES A DAY. USE FOR UP TO 10 DAYS Megan Salon, MD Taking Active   omeprazole (PRILOSEC) 40 MG capsule [id number] Yes TAKE 1 CAPSULE(40 MG) BY MOUTH DAILY Marin Olp, MD Taking Active Self  sodium chloride (OCEAN) 0.65 % SOLN nasal spray [id number] Yes Place 1 spray into both nostrils 2 (two) times daily as needed for congestion. [provider] Taking Active Self  traZODone (DESYREL) 100 MG tablet [id number] Yes TAKE 1/4 TO 1/2 TABLET BY MOUTH AT BEDTIME IF NEEDED FOR SLEEP Marin Olp, MD Taking Active   venlafaxine XR (EFFEXOR-XR) 150 MG 24 hr capsule [id number] Yes TAKE 1 CAPSULE(150 MG) BY MOUTH TWICE  DAILY Marin Olp, MD Taking Active           Patient Active Problem List   Diagnosis Date Noted  . Osteopenia of neck of right femur 03/29/2019  . Chest pain 01/24/2018  . Liver lesion 11/10/2017  . Nausea & vomiting 10/31/2017  . Chronic daily headache 07/08/2017  . Memory changes 03/06/2017  . Lumbar stenosis with neurogenic claudication 03/20/2016  . Scalp pain 10/22/2015  . Post-traumatic headache 10/11/2015  . Routine health maintenance 03/06/2013  . Macular degeneration of right eye 03/03/2013  . Glaucoma 03/03/2013  . Hyperlipidemia 03/14/2009  . Depression 10/19/2007  . Essential hypertension 10/19/2007  . GASTROESOPHAGEAL REFLUX DISEASE 10/19/2007  . Osteoarthritis 10/19/2007  . STRESS INCONTINENCE 10/19/2007  . Allergic rhinitis 10/19/2007    Immunization History  Administered Date(s) Administered  .  Fluad Quad(high Dose 65+) 05/26/2019, 06/18/2020  . Influenza Whole 07/18/2004, 04/25/2010, 04/28/2010  . Influenza, High Dose Seasonal PF 08/26/2013, 04/19/2014, 04/04/2016  . Influenza-Unspecified 05/11/2012, 04/03/2015, 04/13/2017  . PFIZER(Purple Top)SARS-COV-2 Vaccination 09/09/2019, 09/30/2019, 05/24/2020  . Pneumococcal Conjugate-13 10/08/2015  . Pneumococcal Polysaccharide-23 08/11/2006  . Td 03/09/2008  . Tdap 04/03/2015  . Zoster 08/11/2009  . Zoster Recombinat (Shingrix) 12/17/2016   Conditions to be addressed/monitored: HLD HTN OA Stress incontinence GERD MDD  Care Plan : [place]  Updates made by Madelin Rear, [facility] since 12/12/2020 12:00 AM    Problem: HLD HTN OA Stress incontinence GERD MDD   Priority: High    Long-Range Goal: Disease Management   Start Date: 12/12/2020  Expected End Date: 12/12/2021  This Visit's Progress: On track  Priority: High  Note:   Current Barriers:  . n/a  Pharmacist Clinical Goal(s):  Marland Kitchen Patient will contact provider office for questions/concerns as evidenced notation of same in electronic  health record through collaboration with PharmD and provider.   Interventions: . 1:1 collaboration with Marin Olp, MD regarding development and update of comprehensive plan of care as evidenced by provider attestation and co-signature . Inter-disciplinary care team collaboration (see longitudinal plan of care) . Comprehensive medication review performed; medication list updated in electronic medical record . No Rx changes  Hypertension (BP goal <130/80) -Controlled -Current treatment: . 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 No recent falls reported by patient.  -Educated on Symptoms of hypotension and importance of maintaining adequate hydration; -Counseled to monitor BP at home, document, and provide log at future appointments -Recommended to continue current medication  Hyperlipidemia: (LDL goal < 100) -Controlled -On asa daily - denies CAD/Stroke Hx -Current treatment:  Vytorin 10-20 mg once daily -Review of side effects - no problems noted -Educated on Cholesterol goals;  -Recommended to continue current medication  Osteopenia (Goal minimize symptoms) -Controlled -Last DEXA Scan: 2020 -Patient is not a candidate for pharmacologic treatment -Current treatment  . Will start Vitamin D3 1000 units -Medications previously tried:   -Recommend [phone] units of vitamin D daily. Recommend weight-bearing and muscle strengthening exercises for building and maintaining bone density.  MDD (Goal: ensure medication) -Controlled -Feels depression has been well controlled, no side effects. Pain management has been reasonable following spinal surgery 2019, knows not to over do it now. Current treatment  . Venlafaxine XR 150 mg twice daily  -Reviewed side effects - no problems noted, sleep has been good  as long as trazodone 25 mg is being used -Recommended to continue current medication  Patient Goals/Self-Care Activities . Patient will:  - target a minimum of 150 minutes of moderate intensity exercise weekly  Follow Up Plan: [place] f/u telephone visit 6-8 months. 3 months gen/adh CPA call. Medication Assistance: None required.  Patient affirms current coverage meets needs.    Patient's preferred pharmacy is:  [facility] [place] [address] [place] [address] Phone: [phone] Fax: [phone]  [facility] [place] [place] [facility] [address] [place] [address] Phone: [phone] Fax: [phone]  Follow Up:  Patient agrees to Care Plan and Follow-up.  Future Appointments  Date Time Provider [place]  12/12/2020 12:00 PM Madelin Rear, [facility]  04/25/2021  11:45 AM [facility] [place], PharmD, CPP Clinical Pharmacist Practitioner  [facility]  [phone]

## 2021-01-04 DIAGNOSIS — D49511 Neoplasm of unspecified behavior of right kidney: Secondary | ICD-10-CM | POA: Diagnosis not present

## 2021-01-04 DIAGNOSIS — D1771 Benign lipomatous neoplasm of kidney: Secondary | ICD-10-CM | POA: Diagnosis not present

## 2021-01-04 DIAGNOSIS — K7689 Other specified diseases of liver: Secondary | ICD-10-CM | POA: Diagnosis not present

## 2021-01-12 ENCOUNTER — Other Ambulatory Visit: Payer: Self-pay | Admitting: Family Medicine

## 2021-01-16 ENCOUNTER — Telehealth: Payer: Self-pay

## 2021-01-16 DIAGNOSIS — K769 Liver disease, unspecified: Secondary | ICD-10-CM

## 2021-01-16 DIAGNOSIS — R8271 Bacteriuria: Secondary | ICD-10-CM | POA: Diagnosis not present

## 2021-01-16 DIAGNOSIS — I700 Atherosclerosis of aorta: Secondary | ICD-10-CM

## 2021-01-16 DIAGNOSIS — N3021 Other chronic cystitis with hematuria: Secondary | ICD-10-CM | POA: Diagnosis not present

## 2021-01-16 NOTE — Telephone Encounter (Signed)
Spouse has dropped off copy of CT of abdomen and pelvis with/ without contrast.   States this was completed by urology.  States urology asked them to request Dr. Yong Channel to review the CT to determine if anything else needs to be ordered.  I have placed this in Dr. Ronney Lion basket.

## 2021-01-16 NOTE — Telephone Encounter (Signed)
Team please make sure this goes directly in my review file- I do not see it there yet

## 2021-01-16 NOTE — Telephone Encounter (Signed)
FYI

## 2021-01-17 DIAGNOSIS — I700 Atherosclerosis of aorta: Secondary | ICD-10-CM

## 2021-01-17 HISTORY — DX: Atherosclerosis of aorta: I70.0

## 2021-01-17 NOTE — Telephone Encounter (Signed)
Team I reviewed the CT.  1.  Patient has a left-sided lumbar hernia which includes a small portion of her colon-likely there does not appear to be any damage to the colon.  We could certainly get a surgical consult for their opinion-if patient has worsening pain in that area should certainly seek care.  You may place a referral if you would like.  2.  There was some liver lesions that appeared stable from 2019.  At that time they mention possible MRI of the liver and this exam also mentions MRI with and without IV gadolinium/contrast. -Current recommendations were to proceed with this as well-I have ordered an MRI but there is a contrast shortage-I am hoping they are able to complete this -This is based on "indeterminate lesion in segment 6 and 7 of the liver incompletely characterized on today's examination but similar to the prior study likely representing cavernous hemangioma.  This can be definitively characterized with follow-up abdominal MRI with and without IV gadolinium if of clinical concern.  There is also a large simple cyst in segment 4A of the liver"  3.  There was also plaque on the aorta and on the heart-we need to attempt to control cholesterol and blood pressure-we need to follow-up on cholesterol levels at next visit-make sure she has a visit scheduled in the next few months

## 2021-01-17 NOTE — Telephone Encounter (Signed)
Form has been placed in his folder.

## 2021-01-18 NOTE — Telephone Encounter (Signed)
Called and lm for pt tcb. 

## 2021-01-18 NOTE — Telephone Encounter (Signed)
Mychart message has been sent also. 

## 2021-01-24 ENCOUNTER — Telehealth: Payer: Self-pay

## 2021-01-24 NOTE — Telephone Encounter (Signed)
Returned patient's call Unable to reach her, [facility]. Only calling patient to see if she received her My chart message and I see that she did.

## 2021-01-24 NOTE — Telephone Encounter (Signed)
Patient stated she received a phone call ( no note in chart of any calls to patient) but she returned call

## 2021-02-13 DIAGNOSIS — H401122 Primary open-angle glaucoma, left eye, moderate stage: Secondary | ICD-10-CM | POA: Diagnosis not present

## 2021-02-28 DIAGNOSIS — R8279 Other abnormal findings on microbiological examination of urine: Secondary | ICD-10-CM | POA: Diagnosis not present

## 2021-02-28 DIAGNOSIS — N3021 Other chronic cystitis with hematuria: Secondary | ICD-10-CM | POA: Diagnosis not present

## 2021-03-11 ENCOUNTER — Other Ambulatory Visit: Payer: Self-pay | Admitting: Family Medicine

## 2021-03-12 ENCOUNTER — Other Ambulatory Visit: Payer: Self-pay | Admitting: Family Medicine

## 2021-03-14 ENCOUNTER — Telehealth: Payer: Self-pay | Admitting: Pharmacist

## 2021-03-16 ENCOUNTER — Other Ambulatory Visit: Payer: Self-pay | Admitting: Family Medicine

## 2021-03-18 NOTE — Chronic Care Management (AMB) (Addendum)
    Chronic Care Management Pharmacy Assistant   Name: [name]  MRN: [id number] DOB: [birth year]   Reason for Encounter: General Adherence Disease State Call    Recent office visits:  01/16/2021 [name], [name]; acute cystitis, no further information available.  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  Recent consult visits:  None  Hospital visits:  None in previous 6 months  Medications: Outpatient Encounter Medications as of 03/14/2021  Medication Sig   ALPRAZolam (XANAX) 0.5 MG tablet TAKE 1/2 TO 1 TABLET(0.25 TO 0.5 MG) BY MOUTH TWICE DAILY AS NEEDED FOR ANXIETY   aspirin 81 MG tablet Take 81 mg by mouth daily.   brimonidine-timolol (COMBIGAN) 0.2-0.5 % ophthalmic solution Place 1 drop into both eyes every 12 (twelve) hours.   cephALEXin (KEFLEX) 500 MG capsule Take 1 capsule (500 mg total) by mouth 3 (three) times daily.   clobetasol ointment (TEMOVATE) 0.05 % apply to affected area twice a day if needed DO NOT USE FOR MORE THAN 5 DAYS   ezetimibe-simvastatin (VYTORIN) 10-20 MG tablet Take 1 tablet by mouth daily.   nadolol (CORGARD) 40 MG tablet TAKE 1 TABLET(40 MG) BY MOUTH AT BEDTIME   nystatin ointment (MYCOSTATIN) APPLY TO THE AFFECTED AREA 1 TO 2 TIMES A DAY. USE FOR UP TO 10 DAYS   omeprazole (PRILOSEC) 40 MG capsule TAKE 1 CAPSULE(40 MG) BY MOUTH DAILY   sodium chloride (OCEAN) 0.65 % SOLN nasal spray Place 1 spray into both nostrils 2 (two) times daily as needed for congestion.   traZODone (DESYREL) 100 MG tablet TAKE 1/4 TO 1/2 TABLET BY MOUTH AT BEDTIME IF NEEDED FOR SLEEP. PLEASE SCHEDULE APPT WITH DR. HUNTER FOR FURTHER REFILLS [phone]   venlafaxine XR (EFFEXOR-XR) 150 MG 24 hr capsule TAKE 1 CAPSULE BY MOUTH TWICE A DAY   No facility-administered encounter medications on file as of 03/14/2021.    Patient Questions: Have you had any problems recently with your health? Patient  states she has not had any problems with her health recently.  Have you had any problems with your pharmacy? Patient states she has not had any problems with her pharmacy.  What issues or side effects are you having with your medications? Patient states she is not having any issues or side effects with any of her medications.  What would you like me to pass along to Leata Mouse, CPP for him to help you with?  Patient states she does not have anything to pass along at this time.  What can we do to take care of you better? Patient did not have any suggestions.  Future Appointments  Date Time Provider [place]  04/25/2021 11:45 AM [facility] HEALTH COACH [facility]  07/15/2021 10:30 AM [facility] CCM PHARMACIST [facility]    Star Rating Drugs: Ezetimibe-Simvastatin 10-20 mg last filled 01/13/2021 90 DS  April D Calhoun, [place] Pharmacist Assistant [phone]   6 minutes spent in review, coordination, and documentation.  Reviewed by: Beverly Milch, PharmD Clinical Pharmacist [phone]

## 2021-03-25 ENCOUNTER — Other Ambulatory Visit: Payer: Self-pay

## 2021-03-25 MED ORDER — ALPRAZOLAM 0.5 MG PO TABS
ORAL_TABLET | ORAL | 0 refills | Status: DC
Start: 2021-03-25 — End: 2022-05-15

## 2021-03-25 NOTE — Telephone Encounter (Signed)
MEDICATION: ALPRAZolam (XANAX) 0.5 MG tablet  PHARMAC[id number] [place] [place] [facility] [place] Phone:  [phone]  Fax:  [phone]      Comments:   **Let patient know to contact pharmacy at the end of the day to make sure medication is ready. **  ** Please notify patient to allow 48-72 hours to process**  **Encourage patient to contact the pharmacy for refills or they can request refills through [facility]**

## 2021-03-25 NOTE — Telephone Encounter (Signed)
Rx has been sent to Dr. Yong Channel for approval.

## 2021-04-05 DIAGNOSIS — Z1231 Encounter for screening mammogram for malignant neoplasm of breast: Secondary | ICD-10-CM | POA: Diagnosis not present

## 2021-04-05 LAB — HM MAMMOGRAPHY

## 2021-04-08 ENCOUNTER — Telehealth: Payer: Self-pay | Admitting: Pharmacist

## 2021-04-08 NOTE — Chronic Care Management (AMB) (Signed)
    Chronic Care Management Pharmacy Assistant   Name: [name]  MRN: [id number] DOB: [birth year]   Reason for Encounter: Chart Review    Recent office visits:  None  Recent consult visits:  None  Hospital visits:  None in previous 6 months  Medications: Outpatient Encounter Medications as of 04/08/2021  Medication Sig   ALPRAZolam (XANAX) 0.5 MG tablet TAKE 1/2 TO 1 TABLET(0.25 TO 0.5 MG) BY MOUTH TWICE DAILY AS NEEDED FOR ANXIETY   aspirin 81 MG tablet Take 81 mg by mouth daily.   brimonidine-timolol (COMBIGAN) 0.2-0.5 % ophthalmic solution Place 1 drop into both eyes every 12 (twelve) hours.   cephALEXin (KEFLEX) 500 MG capsule Take 1 capsule (500 mg total) by mouth 3 (three) times daily.   clobetasol ointment (TEMOVATE) 0.05 % apply to affected area twice a day if needed DO NOT USE FOR MORE THAN 5 DAYS   ezetimibe-simvastatin (VYTORIN) 10-20 MG tablet Take 1 tablet by mouth daily.   nadolol (CORGARD) 40 MG tablet TAKE 1 TABLET(40 MG) BY MOUTH AT BEDTIME   nitroGLYCERIN (NITROSTAT) 0.4 MG SL tablet Place 1 tablet (0.4 mg total) under the tongue every 5 (five) minutes as needed.   nystatin ointment (MYCOSTATIN) APPLY TO THE AFFECTED AREA 1 TO 2 TIMES A DAY. USE FOR UP TO 10 DAYS   omeprazole (PRILOSEC) 40 MG capsule TAKE 1 CAPSULE(40 MG) BY MOUTH DAILY   sodium chloride (OCEAN) 0.65 % SOLN nasal spray Place 1 spray into both nostrils 2 (two) times daily as needed for congestion.   traZODone (DESYREL) 100 MG tablet TAKE 1/4 TO 1/2 TABLET BY MOUTH AT BEDTIME IF NEEDED FOR SLEEP. PLEASE SCHEDULE APPT WITH DR. HUNTER FOR FURTHER REFILLS [phone]   venlafaxine XR (EFFEXOR-XR) 150 MG 24 hr capsule TAKE 1 CAPSULE BY MOUTH TWICE A DAY   No facility-administered encounter medications on file as of 04/08/2021.   Reviewed chart for medication changes.  No OVs, Consults, or hospital visits since last care coordination call/Pharmacist visit.   No medication changes  indicated.  Patient has a scheduled follow up with the clinical pharmacist.  No gaps in adherence identified.  Future Appointments  Date Time Provider [place]  04/25/2021 11:45 AM [facility] HEALTH COACH [facility]  07/15/2021 10:30 AM [facility] CCM PHARMACIST [facility]    Star Rating Drugs: ezetimibe-simvastatin 10-20 mg last filled 12/10/2020 90 DS  April D Calhoun, [place] Pharmacist Assistant [phone]

## 2021-04-09 ENCOUNTER — Encounter: Payer: Self-pay | Admitting: Family Medicine

## 2021-04-11 ENCOUNTER — Other Ambulatory Visit: Payer: Self-pay | Admitting: Family Medicine

## 2021-04-22 DIAGNOSIS — Z20828 Contact with and (suspected) exposure to other viral communicable diseases: Secondary | ICD-10-CM | POA: Diagnosis not present

## 2021-04-25 ENCOUNTER — Ambulatory Visit: Payer: Medicare HMO

## 2021-04-25 DIAGNOSIS — Z20828 Contact with and (suspected) exposure to other viral communicable diseases: Secondary | ICD-10-CM | POA: Diagnosis not present

## 2021-04-27 ENCOUNTER — Emergency Department (HOSPITAL_COMMUNITY): Payer: Medicare HMO

## 2021-04-27 ENCOUNTER — Ambulatory Visit (HOSPITAL_COMMUNITY)
Admission: EM | Admit: 2021-04-27 | Discharge: 2021-04-28 | Disposition: A | Discharge status: Home / Self Care | Payer: Medicare HMO | Attending: Emergency Medicine | Admitting: Emergency Medicine

## 2021-04-27 ENCOUNTER — Other Ambulatory Visit: Payer: Self-pay

## 2021-04-27 ENCOUNTER — Encounter (HOSPITAL_COMMUNITY)
Admission: EM | Disposition: A | Discharge status: Home / Self Care | Payer: Self-pay | Source: Home / Self Care | Attending: Emergency Medicine

## 2021-04-27 ENCOUNTER — Emergency Department (HOSPITAL_COMMUNITY): Payer: Medicare HMO | Admitting: Certified Registered"

## 2021-04-27 DIAGNOSIS — Z87891 Personal history of nicotine dependence: Secondary | ICD-10-CM | POA: Insufficient documentation

## 2021-04-27 DIAGNOSIS — S199XXA Unspecified injury of neck, initial encounter: Secondary | ICD-10-CM | POA: Diagnosis not present

## 2021-04-27 DIAGNOSIS — Y92410 Unspecified street and highway as the place of occurrence of the external cause: Secondary | ICD-10-CM | POA: Insufficient documentation

## 2021-04-27 DIAGNOSIS — S81812A Laceration without foreign body, left lower leg, initial encounter: Secondary | ICD-10-CM | POA: Diagnosis not present

## 2021-04-27 DIAGNOSIS — Y998 Other external cause status: Secondary | ICD-10-CM | POA: Diagnosis not present

## 2021-04-27 DIAGNOSIS — Z888 Allergy status to other drugs, medicaments and biological substances status: Secondary | ICD-10-CM | POA: Insufficient documentation

## 2021-04-27 DIAGNOSIS — S81811A Laceration without foreign body, right lower leg, initial encounter: Secondary | ICD-10-CM | POA: Insufficient documentation

## 2021-04-27 DIAGNOSIS — Z885 Allergy status to narcotic agent status: Secondary | ICD-10-CM | POA: Diagnosis not present

## 2021-04-27 DIAGNOSIS — Z20822 Contact with and (suspected) exposure to covid-19: Secondary | ICD-10-CM | POA: Insufficient documentation

## 2021-04-27 DIAGNOSIS — R58 Hemorrhage, not elsewhere classified: Secondary | ICD-10-CM | POA: Diagnosis not present

## 2021-04-27 DIAGNOSIS — S0990XA Unspecified injury of head, initial encounter: Secondary | ICD-10-CM | POA: Insufficient documentation

## 2021-04-27 DIAGNOSIS — Z041 Encounter for examination and observation following transport accident: Secondary | ICD-10-CM | POA: Diagnosis not present

## 2021-04-27 DIAGNOSIS — Y93I9 Activity, other involving external motion: Secondary | ICD-10-CM | POA: Diagnosis not present

## 2021-04-27 DIAGNOSIS — M4326 Fusion of spine, lumbar region: Secondary | ICD-10-CM | POA: Diagnosis not present

## 2021-04-27 DIAGNOSIS — I10 Essential (primary) hypertension: Secondary | ICD-10-CM | POA: Diagnosis not present

## 2021-04-27 DIAGNOSIS — Z981 Arthrodesis status: Secondary | ICD-10-CM | POA: Diagnosis not present

## 2021-04-27 DIAGNOSIS — S81802A Unspecified open wound, left lower leg, initial encounter: Secondary | ICD-10-CM | POA: Diagnosis not present

## 2021-04-27 DIAGNOSIS — R609 Edema, unspecified: Secondary | ICD-10-CM | POA: Diagnosis not present

## 2021-04-27 DIAGNOSIS — K219 Gastro-esophageal reflux disease without esophagitis: Secondary | ICD-10-CM | POA: Diagnosis not present

## 2021-04-27 DIAGNOSIS — R0902 Hypoxemia: Secondary | ICD-10-CM | POA: Diagnosis not present

## 2021-04-27 HISTORY — PX: INCISION AND DRAINAGE OF WOUND: SHX1803

## 2021-04-27 HISTORY — PX: I & D EXTREMITY: SHX5045

## 2021-04-27 HISTORY — PX: APPLICATION OF WOUND VAC: SHX5189

## 2021-04-27 LAB — COMPREHENSIVE METABOLIC PANEL
ALT: 16 U/L (ref 0–44)
AST: 21 U/L (ref 15–41)
Albumin: 3.6 g/dL (ref 3.5–5.0)
Alkaline Phosphatase: 52 U/L (ref 38–126)
Anion gap: 7 (ref 5–15)
BUN: 14 mg/dL (ref 8–23)
CO2: 24 mmol/L (ref 22–32)
Calcium: 8.8 mg/dL — ABNORMAL LOW (ref 8.9–10.3)
Chloride: 106 mmol/L (ref 98–111)
Creatinine, Ser: 0.85 mg/dL (ref 0.44–1.00)
GFR, Estimated: >60 mL/min (ref >60)
Glucose, Bld: 99 mg/dL (ref 70–99)
Potassium: 3.8 mmol/L (ref 3.5–5.1)
Sodium: 137 mmol/L (ref 135–145)
Total Bilirubin: 0.5 mg/dL (ref 0.3–1.2)
Total Protein: 5.9 g/dL — ABNORMAL LOW (ref 6.5–8.1)

## 2021-04-27 LAB — CBC
HCT: 39.7 % (ref 36.0–46.0)
Hemoglobin: 12.7 g/dL (ref 12.0–15.0)
MCH: 31.7 pg (ref 26.0–34.0)
MCHC: 32 g/dL (ref 30.0–36.0)
MCV: 99 fL (ref 80.0–100.0)
Platelets: 223 10*3/uL (ref 150–400)
RBC: 4.01 MIL/uL (ref 3.87–5.11)
RDW: 13.1 % (ref 11.5–15.5)
WBC: 8 10*3/uL (ref 4.0–10.5)
nRBC: 0 % (ref 0.0–0.2)

## 2021-04-27 LAB — URINALYSIS, ROUTINE W REFLEX MICROSCOPIC
Bilirubin Urine: NEGATIVE
Glucose, UA: NEGATIVE mg/dL
Hgb urine dipstick: NEGATIVE
Ketones, ur: 20 mg/dL — AB
Nitrite: NEGATIVE
Protein, ur: NEGATIVE mg/dL
Specific Gravity, Urine: 1.014 (ref 1.005–1.030)
pH: 5 (ref 5.0–8.0)

## 2021-04-27 LAB — I-STAT CHEM 8, ED
BUN: 16 mg/dL (ref 8–23)
Calcium, Ion: 1.16 mmol/L (ref 1.15–1.40)
Chloride: 105 mmol/L (ref 98–111)
Creatinine, Ser: 0.8 mg/dL (ref 0.44–1.00)
Glucose, Bld: 95 mg/dL (ref 70–99)
HCT: 37 % (ref 36.0–46.0)
Hemoglobin: 12.6 g/dL (ref 12.0–15.0)
Potassium: 3.8 mmol/L (ref 3.5–5.1)
Sodium: 139 mmol/L (ref 135–145)
TCO2: 24 mmol/L (ref 22–32)

## 2021-04-27 LAB — RESP PANEL BY RT-PCR (FLU A&B, COVID) ARPGX2
Influenza A by PCR: NEGATIVE
Influenza B by PCR: NEGATIVE
SARS Coronavirus 2 by RT PCR: NEGATIVE

## 2021-04-27 LAB — PROTIME-INR
INR: 1.1 (ref 0.8–1.2)
Prothrombin Time: 14.2 seconds (ref 11.4–15.2)

## 2021-04-27 LAB — SAMPLE TO BLOOD BANK

## 2021-04-27 LAB — LACTIC ACID, PLASMA: Lactic Acid, Venous: 1.4 mmol/L (ref 0.5–1.9)

## 2021-04-27 LAB — ETHANOL: Alcohol, Ethyl (B): <10 mg/dL (ref <10)

## 2021-04-27 SURGERY — APPLICATION, WOUND VAC
Anesthesia: General | Site: Leg Lower | Laterality: Right

## 2021-04-27 MED ORDER — ONDANSETRON HCL 4 MG/2ML IJ SOLN
INTRAMUSCULAR | Status: AC
  Filled 2021-04-27: qty 2

## 2021-04-27 MED ORDER — FENTANYL CITRATE (PF) 250 MCG/5ML IJ SOLN
INTRAMUSCULAR | Status: AC
  Filled 2021-04-27: qty 5

## 2021-04-27 MED ORDER — FENTANYL CITRATE PF 50 MCG/ML IJ SOSY
50.0000 ug | PREFILLED_SYRINGE | Freq: Once | INTRAMUSCULAR | Status: AC
  Administered 2021-04-27: 50 ug via INTRAVENOUS
  Filled 2021-04-27: qty 1

## 2021-04-27 MED ORDER — FENTANYL CITRATE (PF) 100 MCG/2ML IJ SOLN
INTRAMUSCULAR | Status: DC | PRN
  Administered 2021-04-27: 100 ug via INTRAVENOUS

## 2021-04-27 MED ORDER — LIDOCAINE HCL (CARDIAC) PF 100 MG/5ML IV SOSY
PREFILLED_SYRINGE | INTRAVENOUS | Status: DC | PRN
  Administered 2021-04-27: 40 mg via INTRAVENOUS

## 2021-04-27 MED ORDER — SUCCINYLCHOLINE CHLORIDE 200 MG/10ML IV SOSY
PREFILLED_SYRINGE | INTRAVENOUS | Status: AC
  Filled 2021-04-27: qty 10

## 2021-04-27 MED ORDER — FENTANYL CITRATE PF 50 MCG/ML IJ SOSY
25.0000 ug | PREFILLED_SYRINGE | Freq: Once | INTRAMUSCULAR | Status: AC
  Administered 2021-04-27: 25 ug via INTRAVENOUS
  Filled 2021-04-27: qty 1

## 2021-04-27 MED ORDER — PHENYLEPHRINE HCL-NACL 20-0.9 MG/250ML-% IV SOLN
INTRAVENOUS | Status: DC | PRN
  Administered 2021-04-27: 10 ug/min via INTRAVENOUS

## 2021-04-27 MED ORDER — LACTATED RINGERS IV SOLN: INTRAVENOUS | Status: DC | PRN

## 2021-04-27 MED ORDER — ROCURONIUM BROMIDE 10 MG/ML (PF) SYRINGE
PREFILLED_SYRINGE | INTRAVENOUS | Status: AC
  Filled 2021-04-27: qty 10

## 2021-04-27 MED ORDER — PHENYLEPHRINE HCL (PRESSORS) 10 MG/ML IV SOLN
INTRAVENOUS | Status: DC | PRN
  Administered 2021-04-27: 80 ug via INTRAVENOUS
  Administered 2021-04-27: 60 ug via INTRAVENOUS

## 2021-04-27 MED ORDER — CEFAZOLIN SODIUM-DEXTROSE 2-4 GM/100ML-% IV SOLN
2.0000 g | Freq: Once | INTRAVENOUS | Status: AC
  Administered 2021-04-27: 2 g via INTRAVENOUS
  Filled 2021-04-27: qty 100

## 2021-04-27 MED ORDER — PROPOFOL 10 MG/ML IV BOLUS
INTRAVENOUS | Status: AC
  Filled 2021-04-27: qty 20

## 2021-04-27 MED ORDER — PHENYLEPHRINE 40 MCG/ML (10ML) SYRINGE FOR IV PUSH (FOR BLOOD PRESSURE SUPPORT)
PREFILLED_SYRINGE | INTRAVENOUS | Status: AC
  Filled 2021-04-27: qty 10

## 2021-04-27 MED ORDER — SUCCINYLCHOLINE CHLORIDE 200 MG/10ML IV SOSY
PREFILLED_SYRINGE | INTRAVENOUS | Status: DC | PRN
  Administered 2021-04-27: 80 mg via INTRAVENOUS

## 2021-04-27 MED ORDER — PROPOFOL 10 MG/ML IV BOLUS
INTRAVENOUS | Status: DC | PRN
  Administered 2021-04-27: 110 mg via INTRAVENOUS

## 2021-04-27 MED ORDER — ROCURONIUM BROMIDE 100 MG/10ML IV SOLN
INTRAVENOUS | Status: DC | PRN
  Administered 2021-04-27: 40 mg via INTRAVENOUS

## 2021-04-27 MED ORDER — EPHEDRINE SULFATE 50 MG/ML IJ SOLN
INTRAMUSCULAR | Status: DC | PRN
  Administered 2021-04-27: 5 mg via INTRAVENOUS
  Administered 2021-04-28: 10 mg via INTRAVENOUS

## 2021-04-27 MED ORDER — DEXAMETHASONE SODIUM PHOSPHATE 10 MG/ML IJ SOLN
INTRAMUSCULAR | Status: AC
  Filled 2021-04-27: qty 1

## 2021-04-27 MED ORDER — LACTATED RINGERS IV BOLUS
1000.0000 mL | Freq: Once | INTRAVENOUS | Status: AC
  Administered 2021-04-27: 1000 mL via INTRAVENOUS

## 2021-04-27 MED ORDER — LIDOCAINE 2% (20 MG/ML) 5 ML SYRINGE
INTRAMUSCULAR | Status: AC
  Filled 2021-04-27: qty 5

## 2021-04-27 MED ORDER — TETANUS-DIPHTH-ACELL PERTUSSIS 5-2.5-18.5 LF-MCG/0.5 IM SUSY
0.5000 mL | PREFILLED_SYRINGE | Freq: Once | INTRAMUSCULAR | Status: DC
Start: 2021-04-27 — End: 2021-04-28

## 2021-04-27 MED ORDER — ONDANSETRON HCL 4 MG/2ML IJ SOLN
4.0000 mg | Freq: Once | INTRAMUSCULAR | Status: AC
  Administered 2021-04-27: 4 mg via INTRAVENOUS
  Filled 2021-04-27: qty 2

## 2021-04-27 MED ORDER — DEXAMETHASONE SODIUM PHOSPHATE 4 MG/ML IJ SOLN
INTRAMUSCULAR | Status: DC | PRN
  Administered 2021-04-27: 4 mg via INTRAVENOUS

## 2021-04-27 SURGICAL SUPPLY — 54 items
BAG COUNTER SPONGE SURGICOUNT (BAG) IMPLANT
BAG SPNG CNTER NS LX DISP (BAG)
BANDAGE ESMARK 6X9 LF (GAUZE/BANDAGES/DRESSINGS) IMPLANT
BNDG CMPR 9X4 STRL LF SNTH (GAUZE/BANDAGES/DRESSINGS)
BNDG CMPR 9X6 STRL LF SNTH (GAUZE/BANDAGES/DRESSINGS)
BNDG CMPR MED 10X6 ELC LF (GAUZE/BANDAGES/DRESSINGS) ×3
BNDG COHESIVE 4X5 TAN STRL (GAUZE/BANDAGES/DRESSINGS) IMPLANT
BNDG ELASTIC 4X5.8 VLCR STR LF (GAUZE/BANDAGES/DRESSINGS) ×2 IMPLANT
BNDG ELASTIC 6X10 VLCR STRL LF (GAUZE/BANDAGES/DRESSINGS) ×4 IMPLANT
BNDG ESMARK 4X9 LF (GAUZE/BANDAGES/DRESSINGS) IMPLANT
BNDG ESMARK 6X9 LF (GAUZE/BANDAGES/DRESSINGS)
BNDG GAUZE ELAST 4 BULKY (GAUZE/BANDAGES/DRESSINGS) ×4 IMPLANT
COVER SURGICAL LIGHT HANDLE (MISCELLANEOUS) ×8 IMPLANT
CUFF TOURN SGL QUICK 34 (TOURNIQUET CUFF) ×4
CUFF TRNQT CYL 34X4.125X (TOURNIQUET CUFF) ×3 IMPLANT
DRAPE U-SHAPE 47X51 STRL (DRAPES) ×4 IMPLANT
DRESSING PEEL AND PLC PRVNA 13 (GAUZE/BANDAGES/DRESSINGS) IMPLANT
DRSG ADAPTIC 3X8 NADH LF (GAUZE/BANDAGES/DRESSINGS) ×2 IMPLANT
DRSG PAD ABDOMINAL 8X10 ST (GAUZE/BANDAGES/DRESSINGS) IMPLANT
DRSG PEEL AND PLACE PREVENA 13 (GAUZE/BANDAGES/DRESSINGS) ×4
DRSG XEROFORM 1X8 (GAUZE/BANDAGES/DRESSINGS) IMPLANT
DURAPREP 26ML APPLICATOR (WOUND CARE) ×4 IMPLANT
ELECT REM PT RETURN 9FT ADLT (ELECTROSURGICAL) ×4
ELECTRODE REM PT RTRN 9FT ADLT (ELECTROSURGICAL) ×3 IMPLANT
GAUZE SPONGE 4X4 12PLY STRL (GAUZE/BANDAGES/DRESSINGS) ×2 IMPLANT
GAUZE SPONGE 4X4 12PLY STRL LF (GAUZE/BANDAGES/DRESSINGS) ×4 IMPLANT
GAUZE XEROFORM 1X8 LF (GAUZE/BANDAGES/DRESSINGS) ×4 IMPLANT
GLOVE SURG ENC TEXT LTX SZ7.5 (GLOVE) ×4 IMPLANT
GLOVE SURG UNDER LTX SZ8 (GLOVE) ×4 IMPLANT
GOWN STRL REUS W/ TWL LRG LVL3 (GOWN DISPOSABLE) ×3 IMPLANT
GOWN STRL REUS W/ TWL XL LVL3 (GOWN DISPOSABLE) ×3 IMPLANT
GOWN STRL REUS W/TWL LRG LVL3 (GOWN DISPOSABLE) ×4
GOWN STRL REUS W/TWL XL LVL3 (GOWN DISPOSABLE) ×4
HANDPIECE INTERPULSE COAX TIP (DISPOSABLE) ×4
KIT BASIN OR (CUSTOM PROCEDURE TRAY) ×4 IMPLANT
KIT TURNOVER KIT B (KITS) ×4 IMPLANT
MANIFOLD NEPTUNE II (INSTRUMENTS) ×4 IMPLANT
MATRIX SURG PERF 6LAYER 7X10 (Tissue) ×1 IMPLANT
NEEDLE 22X1 1/2 (OR ONLY) (NEEDLE) IMPLANT
NS IRRIG 1000ML POUR BTL (IV SOLUTION) ×4 IMPLANT
PACK ORTHO EXTREMITY (CUSTOM PROCEDURE TRAY) ×4 IMPLANT
PAD ARMBOARD 7.5X6 YLW CONV (MISCELLANEOUS) ×8 IMPLANT
PAD CAST 4YDX4 CTTN HI CHSV (CAST SUPPLIES) ×3 IMPLANT
PADDING CAST ABS 4INX4YD NS (CAST SUPPLIES) ×1
PADDING CAST ABS COTTON 4X4 ST (CAST SUPPLIES) IMPLANT
PADDING CAST COTTON 4X4 STRL (CAST SUPPLIES) ×4
PREVENA INCISION MGT 90 150 (MISCELLANEOUS) ×1 IMPLANT
SET HNDPC FAN SPRY TIP SCT (DISPOSABLE) ×3 IMPLANT
SPONGE T-LAP 18X18 ~~LOC~~+RFID (SPONGE) IMPLANT
STOCKINETTE IMPERVIOUS 9X36 MD (GAUZE/BANDAGES/DRESSINGS) IMPLANT
SUT ETHILON 2 0 FS 18 (SUTURE) ×5 IMPLANT
TUBE CONNECTING 12X1/4 (SUCTIONS) ×4 IMPLANT
UNDERPAD 30X36 HEAVY ABSORB (UNDERPADS AND DIAPERS) ×4 IMPLANT
YANKAUER SUCT BULB TIP NO VENT (SUCTIONS) ×4 IMPLANT
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
## 2021-04-27 NOTE — ED Notes (Signed)
Provided pt with two warm blankets. 

## 2021-04-27 NOTE — ED Notes (Signed)
Patient transported to CT 
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
## 2021-04-27 NOTE — Anesthesia Procedure Notes (Signed)
Procedure Name: Intubation Date/Time: 04/27/2021 11:35 PM Performed by: Oletta Lamas, CRNA Pre-anesthesia Checklist: Patient identified, Emergency Drugs available, Suction available and Patient being monitored Patient Re-evaluated:Patient Re-evaluated prior to induction Oxygen Delivery Method: Circle System Utilized Preoxygenation: Pre-oxygenation with 100% oxygen Induction Type: IV induction Ventilation: Mask ventilation without difficulty Laryngoscope Size: Mac and 3 Grade View: Grade I Tube type: Oral Tube size: 7.0 mm Number of attempts: 1 Airway Equipment and Method: Stylet Placement Confirmation: ETT inserted through vocal cords under direct vision, positive ETCO2 and breath sounds checked- equal and bilateral Secured at: 22 cm Tube secured with: Tape Dental Injury: Teeth and Oropharynx as per pre-operative assessment

## 2021-04-27 NOTE — ED Notes (Signed)
[employer] at the bedside
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
## 2021-04-28 DIAGNOSIS — S81802A Unspecified open wound, left lower leg, initial encounter: Secondary | ICD-10-CM | POA: Diagnosis not present

## 2021-04-28 DIAGNOSIS — S81811A Laceration without foreign body, right lower leg, initial encounter: Secondary | ICD-10-CM | POA: Diagnosis not present

## 2021-04-28 MED ORDER — CEPHALEXIN 500 MG PO CAPS: 500.0000 mg | ORAL_CAPSULE | Freq: Three times a day (TID) | ORAL | 0 refills | Status: AC

## 2021-04-28 MED ORDER — SUGAMMADEX SODIUM 200 MG/2ML IV SOLN
INTRAVENOUS | Status: DC | PRN
  Administered 2021-04-28: 150 mg via INTRAVENOUS

## 2021-04-28 MED ORDER — ACETAMINOPHEN 10 MG/ML IV SOLN: 1000.0000 mg | Freq: Once | INTRAVENOUS | Status: DC | PRN

## 2021-04-28 MED ORDER — HYDROCODONE-ACETAMINOPHEN 5-325 MG PO TABS: 1.0000 | ORAL_TABLET | ORAL | 0 refills | Status: DC | PRN

## 2021-04-28 MED ORDER — SODIUM CHLORIDE 0.9 % IR SOLN
Status: DC | PRN
  Administered 2021-04-28: 3000 mL

## 2021-04-28 MED ORDER — FENTANYL CITRATE (PF) 100 MCG/2ML IJ SOLN: 25.0000 ug | INTRAMUSCULAR | Status: DC | PRN

## 2021-04-28 MED ORDER — ONDANSETRON HCL 4 MG/2ML IJ SOLN
INTRAMUSCULAR | Status: DC | PRN
  Administered 2021-04-28: 4 mg via INTRAVENOUS
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
## 2021-04-28 NOTE — Anesthesia Postprocedure Evaluation (Signed)
Anesthesia Post Note  Patient: [name]  Procedure(s) Performed: IRRIGATION AND DEBRIDEMENT LEFT LEG (Left) APPLICATION OF INCISIONAL WOUND VAC (Left: Leg Lower) IRRIGATION AND DEBRIDEMENT WOUND (Right: Leg Lower)     Patient location during evaluation: PACU Anesthesia Type: General Level of consciousness: awake and alert Pain management: pain level controlled Vital Signs Assessment: post-procedure vital signs reviewed and stable Respiratory status: spontaneous breathing, nonlabored ventilation, respiratory function stable and patient connected to nasal cannula oxygen Cardiovascular status: blood pressure returned to baseline and stable Postop Assessment: no apparent nausea or vomiting Anesthetic complications: no   No notable events documented.  Last Vitals:  Vitals:   04/28/21 0100 04/28/21 0115  BP: (!) 114/58 (!) 128/95  Pulse: 77 78  Resp: 18 17  Temp:  36.6 C  SpO2: 98% 100%    Last Pain:  Vitals:   04/28/21 0115  TempSrc:   PainSc: 0-No pain                 Belenda Cruise P Tascha Casares
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
## 2021-04-28 NOTE — Transfer of Care (Signed)
Immediate Anesthesia Transfer of Care Note  Patient: [name]  Procedure(s) Performed: IRRIGATION AND DEBRIDEMENT LEFT LEG (Left) POSSIBLE APPLICATION OF INCISIONAL WOUND VAC (Left)  Patient Location: PACU  Anesthesia Type:General  Level of Consciousness: awake, drowsy and patient cooperative  Airway & Oxygen Therapy: Patient Spontanous Breathing and Patient connected to face mask oxygen  Post-op Assessment: Report given to RN and Post -op Vital signs reviewed and stable  Post vital signs: Reviewed and stable  Last Vitals:  Vitals Value Taken Time  BP 163/74 04/28/21 0046  Temp    Pulse 79 04/28/21 0048  Resp 13 04/28/21 0048  SpO2 100 % 04/28/21 0048  Vitals shown include unvalidated device data.  Last Pain:  Vitals:   04/27/21 2123  TempSrc:   PainSc: 9          Complications: No notable events documented.

## 2021-04-29 ENCOUNTER — Encounter (HOSPITAL_COMMUNITY): Payer: Self-pay | Admitting: Orthopaedic Surgery

## 2021-05-04 ENCOUNTER — Other Ambulatory Visit: Payer: Self-pay | Admitting: Family Medicine

## 2021-05-06 ENCOUNTER — Telehealth: Payer: Self-pay

## 2021-05-06 ENCOUNTER — Other Ambulatory Visit: Payer: Self-pay

## 2021-05-06 DIAGNOSIS — M79661 Pain in right lower leg: Secondary | ICD-10-CM | POA: Diagnosis not present

## 2021-05-06 NOTE — Telephone Encounter (Signed)
You may refill this but try to get her set up for an appointment-they likely could at least do a virtual visit.

## 2021-05-06 NOTE — Telephone Encounter (Signed)
Is this ok? Last OV 07/25/2020.
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
## 2021-05-07 NOTE — Progress Notes (Signed)
No show for virtual visit  

## 2021-05-07 NOTE — Telephone Encounter (Signed)
Please schedule pt for virtual per Dr. Yong Channel if agreeable.

## 2021-05-07 NOTE — Patient Instructions (Signed)
Health Maintenance Due  Topic Date Due   Zoster Vaccines- Shingrix (2 of 2) 02/11/2017   INFLUENZA VACCINE  03/11/2021    Recommended follow up: No follow-ups on file.

## 2021-05-07 NOTE — Telephone Encounter (Signed)
Unable to reach patient, vm is full for both numbers.

## 2021-05-07 NOTE — Telephone Encounter (Signed)
Pt is scheduled for virtual on Thursday

## 2021-05-08 ENCOUNTER — Ambulatory Visit: Payer: Medicare HMO | Admitting: Plastic Surgery

## 2021-05-08 ENCOUNTER — Encounter: Payer: Self-pay | Admitting: Plastic Surgery

## 2021-05-08 ENCOUNTER — Other Ambulatory Visit: Payer: Self-pay | Admitting: Family Medicine

## 2021-05-08 ENCOUNTER — Other Ambulatory Visit: Payer: Self-pay

## 2021-05-08 ENCOUNTER — Telehealth: Payer: Self-pay

## 2021-05-08 VITALS — BP 113/68 | HR 71 | Ht 60.0 in | Wt 151.0 lb

## 2021-05-08 DIAGNOSIS — S81802A Unspecified open wound, left lower leg, initial encounter: Secondary | ICD-10-CM
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
## 2021-05-08 NOTE — Telephone Encounter (Signed)
Faxed order to [facility]: Change daily Xeroform, 4x4 gauze, ABD pads, Kerlix, and Ace Wrap

## 2021-05-09 ENCOUNTER — Other Ambulatory Visit: Payer: Self-pay | Admitting: Family Medicine

## 2021-05-09 ENCOUNTER — Telehealth: Payer: Medicare HMO | Admitting: Family Medicine

## 2021-05-09 DIAGNOSIS — I10 Essential (primary) hypertension: Secondary | ICD-10-CM

## 2021-05-09 DIAGNOSIS — F325 Major depressive disorder, single episode, in full remission: Secondary | ICD-10-CM

## 2021-05-09 DIAGNOSIS — I872 Venous insufficiency (chronic) (peripheral): Secondary | ICD-10-CM | POA: Diagnosis not present

## 2021-05-09 DIAGNOSIS — E785 Hyperlipidemia, unspecified: Secondary | ICD-10-CM

## 2021-05-09 DIAGNOSIS — Z8616 Personal history of COVID-19: Secondary | ICD-10-CM | POA: Diagnosis not present

## 2021-05-09 DIAGNOSIS — K219 Gastro-esophageal reflux disease without esophagitis: Secondary | ICD-10-CM

## 2021-05-09 NOTE — Telephone Encounter (Signed)
Attempted to reach patient for virtual visit scheduled at 8 AM-number listed in notes go straight to voicemail.  Called home phone as well and rang indefinitely

## 2021-05-10 ENCOUNTER — Telehealth: Payer: Self-pay

## 2021-05-10 ENCOUNTER — Telehealth: Payer: Self-pay | Admitting: Plastic Surgery

## 2021-05-10 NOTE — Telephone Encounter (Signed)
Ok to refill or hold since she missed her visit yesterday?

## 2021-05-10 NOTE — Telephone Encounter (Signed)
Called and spoke with the patient regarding the message below.  Patient stated that [employer] delivered the package today with the supplies in it.//AB/CMA

## 2021-05-10 NOTE — Telephone Encounter (Signed)
Patient's husband, [name], called to advise that they have not received the yellowish bandage that is supposed to go across her bandage. He said the bandages were changed a couple of days ago and he was supposed to change it today. Please call to advise if he was supposed to receive it in the mail or if he should get it from somewhere else. He can be reached at [phone].

## 2021-05-10 NOTE — Telephone Encounter (Signed)
Called and spoke with the patient's husband regarding the message below.  He stated that he wanted to make sure he didn't misunderstand regarding getting the medical supplies for the patient.  He didn't know if he should be calling to order the supplies, because they have not received anything yet.  Informed the husband that we will be ordering the supplies, and we have already sent the order in.  Informed him that the supply company is [employer], and they will deliver the supplies to their home.    The husband stated he will go and look and see if anything has been delivered.    Also informed the husband that we received the Order Status Notification stating that they received the order, and [facility] has provided service for the patient;no further action is required.    Informed the patient's husband if they do not receive the supplies he can give [facility] a call to follow-up on the order.  Gave the husband the number to [facility].  The husband verbalized understanding and agreed.//AB/CMA

## 2021-05-10 NOTE — Telephone Encounter (Signed)
Patient's husband called to say that he thought we were going to be sending them supplies to change the patient's dressing, but they have not arrived yet.  He said the supplies should include a yellow pad that we wanted them to use.  Please call.

## 2021-05-11 DIAGNOSIS — U071 COVID-19: Secondary | ICD-10-CM

## 2021-05-11 DIAGNOSIS — Z8616 Personal history of COVID-19: Secondary | ICD-10-CM

## 2021-05-11 HISTORY — DX: Personal history of COVID-19: Z86.16

## 2021-05-11 HISTORY — DX: COVID-19: U07.1

## 2021-05-13 ENCOUNTER — Ambulatory Visit: Payer: Medicare HMO | Admitting: Surgical

## 2021-05-13 ENCOUNTER — Encounter: Payer: Self-pay | Admitting: Family Medicine

## 2021-05-14 ENCOUNTER — Encounter: Payer: Self-pay | Admitting: Surgical

## 2021-05-14 ENCOUNTER — Other Ambulatory Visit: Payer: Self-pay

## 2021-05-14 ENCOUNTER — Ambulatory Visit (INDEPENDENT_AMBULATORY_CARE_PROVIDER_SITE_OTHER): Payer: Medicare HMO | Admitting: Surgical

## 2021-05-14 DIAGNOSIS — S81802A Unspecified open wound, left lower leg, initial encounter: Secondary | ICD-10-CM
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
## 2021-05-15 NOTE — Telephone Encounter (Signed)
LVM to have patient call back and r/s appt.

## 2021-05-16 ENCOUNTER — Ambulatory Visit: Payer: Medicare HMO | Admitting: Surgical

## 2021-05-16 DIAGNOSIS — Z20828 Contact with and (suspected) exposure to other viral communicable diseases: Secondary | ICD-10-CM | POA: Diagnosis not present

## 2021-05-17 NOTE — Addendum Note (Signed)
Addended by: Lindon Romp on: 05/17/2021 02:53 PM   Modules accepted: Orders

## 2021-05-20 ENCOUNTER — Telehealth: Payer: Self-pay

## 2021-05-20 NOTE — Telephone Encounter (Signed)
Patient calling in and stated she missed a call from Dr. Yong Channel. Patient would like a call back.

## 2021-05-20 NOTE — Telephone Encounter (Signed)
I spoke with Dr. Yong Channel and he has not called the pt, I tried calling pt but a recording came on saying the home number is temporarily unavailable.

## 2021-05-23 DIAGNOSIS — Z20828 Contact with and (suspected) exposure to other viral communicable diseases: Secondary | ICD-10-CM | POA: Diagnosis not present

## 2021-05-27 ENCOUNTER — Telehealth: Payer: Self-pay | Admitting: Plastic Surgery

## 2021-05-27 NOTE — Telephone Encounter (Signed)
Pt was advised to go to ED.   [name], pt has upcoming appt with you on 10/26. Would you like pt to be rescheduled for a sooner appt?

## 2021-05-27 NOTE — Telephone Encounter (Signed)
Patient's husband reports the following:\  Patient's bandages were changed night before last. When the bandage was removed from the left leg today, patient and husband noticed a "rank, rotten smell". Patient reports increased pain in leg as well as redness and hot to touch. Staff recommended ED. Please call to advise [phone]. Thank you.

## 2021-05-30 ENCOUNTER — Other Ambulatory Visit: Payer: Self-pay

## 2021-05-30 ENCOUNTER — Ambulatory Visit: Payer: Medicare HMO | Admitting: Surgical

## 2021-05-30 ENCOUNTER — Encounter: Payer: Self-pay | Admitting: Surgical

## 2021-05-30 DIAGNOSIS — S81802A Unspecified open wound, left lower leg, initial encounter: Secondary | ICD-10-CM | POA: Diagnosis not present

## 2021-05-30 DIAGNOSIS — S81801A Unspecified open wound, right lower leg, initial encounter: Secondary | ICD-10-CM
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
## 2021-06-05 ENCOUNTER — Ambulatory Visit: Payer: Medicare HMO | Admitting: Surgical

## 2021-06-06 DIAGNOSIS — Z20822 Contact with and (suspected) exposure to covid-19: Secondary | ICD-10-CM | POA: Diagnosis not present

## 2021-06-10 DIAGNOSIS — I872 Venous insufficiency (chronic) (peripheral): Secondary | ICD-10-CM | POA: Diagnosis not present

## 2021-06-11 DIAGNOSIS — Z20822 Contact with and (suspected) exposure to covid-19: Secondary | ICD-10-CM | POA: Diagnosis not present

## 2021-06-13 ENCOUNTER — Ambulatory Visit: Payer: Medicare HMO | Admitting: Surgical

## 2021-06-13 ENCOUNTER — Telehealth: Payer: Self-pay | Admitting: Plastic Surgery

## 2021-06-13 NOTE — Telephone Encounter (Signed)
Called and spoke with the patient's husband regarding the message below.  Informed him per Matthew,PA-C-keep dressing the leg wound like he has been doing.  Which is (Xeroform -Yellow),4x4 gauze,ABD pad, Kerlix, and Ace Wrap.    The husband verbalized understanding and agreed.  He stated that the patient has some little wounds on the other leg, so he has been using the supplies on the other leg as well.    Informed the patient's husband that [facility] stated that since the patient was not able to come in today she can come in earlier for her next appointment either (11/9/or /11/10).  The husband verbalized understanding and agreed.  Informed the patient's husband that I will get the appointment scheduled and I will call him back with the appointment time.  The husband verbalized understanding and agreed.  Called the patient's husband back and informed him that we have the patient schedule for (Wednesday 06/19/21 @ 11:20 am).  The husband verbalized understanding and agreed.//AB/CMA

## 2021-06-13 NOTE — Telephone Encounter (Signed)
Patient tested positive for covid 06/11/2021. Patient's spouse has questions regarding bher leg wound and bandaging while quarantining. Please call to advise [phone]. Thank you.

## 2021-06-19 ENCOUNTER — Encounter: Payer: Self-pay | Admitting: Surgical

## 2021-06-19 ENCOUNTER — Ambulatory Visit: Payer: Medicare HMO | Admitting: Surgical

## 2021-06-19 ENCOUNTER — Other Ambulatory Visit: Payer: Self-pay

## 2021-06-19 DIAGNOSIS — S81801A Unspecified open wound, right lower leg, initial encounter: Secondary | ICD-10-CM | POA: Diagnosis not present

## 2021-06-19 DIAGNOSIS — S81802A Unspecified open wound, left lower leg, initial encounter: Secondary | ICD-10-CM

## 2021-06-19 NOTE — Progress Notes (Deleted)
  The note originally documented on this encounter has been moved the the encounter in which it belongs.  
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
## 2021-06-24 ENCOUNTER — Telehealth: Payer: Self-pay

## 2021-06-24 NOTE — Telephone Encounter (Signed)
Pt called wanting to know if she can bring in an urine sample tomorrow to be tested for a kidney infection. She stated that she still has a container from Dr Yong Channel. Can pt use the container and bring in a sample. Please advise.

## 2021-06-25 ENCOUNTER — Ambulatory Visit (INDEPENDENT_AMBULATORY_CARE_PROVIDER_SITE_OTHER): Payer: Medicare HMO | Admitting: Family Medicine

## 2021-06-25 ENCOUNTER — Encounter: Payer: Self-pay | Admitting: Family Medicine

## 2021-06-25 ENCOUNTER — Other Ambulatory Visit: Payer: Self-pay

## 2021-06-25 VITALS — BP 108/67 | HR 73 | Temp 98.0°F | Ht 60.0 in | Wt 149.2 lb

## 2021-06-25 DIAGNOSIS — N390 Urinary tract infection, site not specified: Secondary | ICD-10-CM

## 2021-06-25 DIAGNOSIS — H401123 Primary open-angle glaucoma, left eye, severe stage: Secondary | ICD-10-CM | POA: Diagnosis not present

## 2021-06-25 DIAGNOSIS — H401111 Primary open-angle glaucoma, right eye, mild stage: Secondary | ICD-10-CM | POA: Diagnosis not present

## 2021-06-25 LAB — POCT URINALYSIS DIPSTICK
Bilirubin, UA: NEGATIVE
Blood, UA: NEGATIVE
Glucose, UA: NEGATIVE
Ketones, UA: NEGATIVE
Nitrite, UA: NEGATIVE
Protein, UA: POSITIVE — AB
Spec Grav, UA: >=1.03 — AB (ref 1.010–1.025)
Urobilinogen, UA: 0.2 E.U./dL
pH, UA: 5.5 (ref 5.0–8.0)

## 2021-06-25 MED ORDER — NITROFURANTOIN MONOHYD MACRO 100 MG PO CAPS: 100.0000 mg | ORAL_CAPSULE | Freq: Two times a day (BID) | ORAL | 0 refills | Status: DC

## 2021-06-25 NOTE — Telephone Encounter (Signed)
Pt is scheduled to see Jerline Pain today.
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
## 2021-06-25 NOTE — Telephone Encounter (Signed)
Pt will need OV.

## 2021-06-25 NOTE — Patient Instructions (Signed)
It was nice to see you!  You have a urinary tract infection. Please start the antibiotic.  We will check a urine culture to make sure you do not have a resistant bacteria. We will call you if we need to change your medications.   Please make sure you are drinking plenty of fluids over the next few days.  If your symptoms do not improve over the next 5-7 days, or if they worsen, please let us know. Please also let us know if you have worsening back pain, fevers, chills, or body aches.   Take care, Dr Jerline Pain

## 2021-06-26 ENCOUNTER — Other Ambulatory Visit: Payer: Self-pay

## 2021-06-26 ENCOUNTER — Telehealth: Payer: Self-pay

## 2021-06-26 ENCOUNTER — Encounter: Payer: Self-pay | Admitting: Surgical

## 2021-06-26 ENCOUNTER — Ambulatory Visit (INDEPENDENT_AMBULATORY_CARE_PROVIDER_SITE_OTHER): Payer: Medicare HMO | Admitting: Surgical

## 2021-06-26 DIAGNOSIS — S81802A Unspecified open wound, left lower leg, initial encounter: Secondary | ICD-10-CM

## 2021-06-26 MED ORDER — ONDANSETRON HCL 4 MG PO TABS: 4.0000 mg | ORAL_TABLET | Freq: Three times a day (TID) | ORAL | 0 refills | Status: DC | PRN

## 2021-06-26 MED ORDER — HYDROCODONE-ACETAMINOPHEN 5-325 MG PO TABS: 1.0000 | ORAL_TABLET | Freq: Four times a day (QID) | ORAL | 0 refills | Status: AC | PRN
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
## 2021-06-26 NOTE — Telephone Encounter (Signed)
During office visit today, called [facility] and spoke with Joellen Jersey. Initiated and confirmed wound vac rental. Will be delivered at patients house W[place], 06/26/2021 for surgery on 07/01/2021. Patient paid $280.00 for one month rental at time off office visit. Purchase GA#[id number]. Provided patient with phone number, purchase/tracking ID should they have any problems receiving items. Advised patient and spouse to bring wound vac fully charge day of surgery.

## 2021-06-26 NOTE — Progress Notes (Signed)
Surgical Clearance has been received from Dr. Garret Reddish for patient's upcoming surgery split-thickness skin graft to left lower leg with Dr. Claudia Desanctis. Medications to hold prior to surgery aspirin (Stop taking: Up to 2 weeks prior to surgery begin retaking up to 2 weeks after surgery) .  Patient is medically cleared for surgery.

## 2021-06-26 NOTE — H&P (View-Only) (Signed)
Surgical Clearance has been received from Dr. Garret Reddish for patient's upcoming surgery split-thickness skin graft to left lower leg with Dr. Claudia Desanctis. Medications to hold prior to surgery aspirin (Stop taking: Up to 2 weeks prior to surgery begin retaking up to 2 weeks after surgery) .  Patient is medically cleared for surgery.

## 2021-06-28 ENCOUNTER — Other Ambulatory Visit: Payer: Self-pay

## 2021-06-28 ENCOUNTER — Telehealth: Payer: Self-pay

## 2021-06-28 ENCOUNTER — Encounter (HOSPITAL_COMMUNITY): Payer: Self-pay | Admitting: Plastic Surgery

## 2021-06-28 LAB — URINE CULTURE
MICRO NUMBER:: 12639448
SPECIMEN QUALITY:: ADEQUATE

## 2021-06-28 NOTE — Telephone Encounter (Signed)
Returned call to patient, gave lab results and recommendations. Patient verbalized understanding. 

## 2021-06-28 NOTE — Progress Notes (Signed)
Spoke with pt for pre-op call. Pt denies cardiac history or diabetes. Pt is treated for HTN.   Pt's surgery is scheduled as ambulatory so no Covid test is required prior to surgery.

## 2021-06-28 NOTE — Progress Notes (Signed)
Please inform patient of the following:  Her culture confirms UTI. The antibiotic we have her on should treat this. Would like for her to let us know if her symptoms are not improving.  Algis Greenhouse. Jerline Pain, MD 06/28/2021 11:41 AM

## 2021-06-28 NOTE — Telephone Encounter (Signed)
Pt was returning a call. Please Advise.  

## 2021-06-30 ENCOUNTER — Telehealth: Payer: Self-pay | Admitting: Pharmacist
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
## 2021-07-01 ENCOUNTER — Ambulatory Visit (HOSPITAL_COMMUNITY): Payer: Medicare HMO | Admitting: Certified Registered"

## 2021-07-01 ENCOUNTER — Other Ambulatory Visit: Payer: Self-pay

## 2021-07-01 ENCOUNTER — Ambulatory Visit (HOSPITAL_COMMUNITY)
Admission: RE | Admit: 2021-07-01 | Discharge: 2021-07-01 | Disposition: A | Discharge status: Home / Self Care | Payer: Medicare HMO | Attending: Plastic Surgery | Admitting: Plastic Surgery

## 2021-07-01 ENCOUNTER — Encounter (HOSPITAL_COMMUNITY)
Admission: RE | Disposition: A | Discharge status: Home / Self Care | Payer: Self-pay | Source: Home / Self Care | Attending: Plastic Surgery

## 2021-07-01 ENCOUNTER — Encounter (HOSPITAL_COMMUNITY): Payer: Self-pay | Admitting: Plastic Surgery

## 2021-07-01 DIAGNOSIS — I10 Essential (primary) hypertension: Secondary | ICD-10-CM | POA: Diagnosis not present

## 2021-07-01 DIAGNOSIS — S81802A Unspecified open wound, left lower leg, initial encounter: Secondary | ICD-10-CM | POA: Insufficient documentation

## 2021-07-01 DIAGNOSIS — E7800 Pure hypercholesterolemia, unspecified: Secondary | ICD-10-CM | POA: Diagnosis not present

## 2021-07-01 DIAGNOSIS — H409 Unspecified glaucoma: Secondary | ICD-10-CM | POA: Diagnosis not present

## 2021-07-01 HISTORY — DX: Other specified health status: Z78.9

## 2021-07-01 HISTORY — PX: APPLICATION OF WOUND VAC: SHX5189

## 2021-07-01 HISTORY — DX: Malignant (primary) neoplasm, unspecified: C80.1

## 2021-07-01 HISTORY — PX: SKIN SPLIT GRAFT: SHX444

## 2021-07-01 LAB — BASIC METABOLIC PANEL
Anion gap: 8 (ref 5–15)
BUN: 12 mg/dL (ref 8–23)
CO2: 28 mmol/L (ref 22–32)
Calcium: 9.1 mg/dL (ref 8.9–10.3)
Chloride: 102 mmol/L (ref 98–111)
Creatinine, Ser: 0.78 mg/dL (ref 0.44–1.00)
GFR, Estimated: >60 mL/min (ref >60)
Glucose, Bld: 86 mg/dL (ref 70–99)
Potassium: 4.3 mmol/L (ref 3.5–5.1)
Sodium: 138 mmol/L (ref 135–145)

## 2021-07-01 SURGERY — APPLICATION, GRAFT, SKIN, SPLIT-THICKNESS
Anesthesia: General | Site: Leg Lower | Laterality: Left

## 2021-07-01 MED ORDER — LACTATED RINGERS IR SOLN
Status: DC | PRN
  Administered 2021-07-01: 1000 mL

## 2021-07-01 MED ORDER — ONDANSETRON HCL 4 MG/2ML IJ SOLN
INTRAMUSCULAR | Status: DC | PRN
  Administered 2021-07-01: 4 mg via INTRAVENOUS

## 2021-07-01 MED ORDER — DEXAMETHASONE SODIUM PHOSPHATE 4 MG/ML IJ SOLN
INTRAMUSCULAR | Status: DC | PRN
  Administered 2021-07-01: 4 mg via INTRAVENOUS

## 2021-07-01 MED ORDER — LIDOCAINE HCL (CARDIAC) PF 100 MG/5ML IV SOSY
PREFILLED_SYRINGE | INTRAVENOUS | Status: DC | PRN
  Administered 2021-07-01: 60 mg via INTRAVENOUS

## 2021-07-01 MED ORDER — MINERAL OIL LIGHT 100 % EX OIL
TOPICAL_OIL | CUTANEOUS | Status: DC | PRN
  Administered 2021-07-01: 1 via TOPICAL

## 2021-07-01 MED ORDER — MINERAL OIL LIGHT OIL
TOPICAL_OIL | Status: DC
  Filled 2021-07-01: qty 10

## 2021-07-01 MED ORDER — BUPIVACAINE HCL (PF) 0.25 % IJ SOLN
INTRAMUSCULAR | Status: AC
  Filled 2021-07-01: qty 30

## 2021-07-01 MED ORDER — EPINEPHRINE PF 1 MG/ML IJ SOLN
INTRAMUSCULAR | Status: AC
  Filled 2021-07-01: qty 1

## 2021-07-01 MED ORDER — CHLORHEXIDINE GLUCONATE CLOTH 2 % EX PADS: 6.0000 | MEDICATED_PAD | Freq: Once | CUTANEOUS | Status: DC

## 2021-07-01 MED ORDER — LACTATED RINGERS IV SOLN: INTRAVENOUS | Status: DC

## 2021-07-01 MED ORDER — FENTANYL CITRATE (PF) 100 MCG/2ML IJ SOLN: 25.0000 ug | INTRAMUSCULAR | Status: DC | PRN

## 2021-07-01 MED ORDER — FENTANYL CITRATE (PF) 100 MCG/2ML IJ SOLN
INTRAMUSCULAR | Status: DC | PRN
  Administered 2021-07-01 (×2): 25 ug via INTRAVENOUS

## 2021-07-01 MED ORDER — FENTANYL CITRATE (PF) 250 MCG/5ML IJ SOLN
INTRAMUSCULAR | Status: AC
  Filled 2021-07-01: qty 5

## 2021-07-01 MED ORDER — CEFAZOLIN SODIUM-DEXTROSE 2-4 GM/100ML-% IV SOLN
2.0000 g | INTRAVENOUS | Status: AC
  Administered 2021-07-01: 2 g via INTRAVENOUS
  Filled 2021-07-01: qty 100

## 2021-07-01 MED ORDER — 0.9 % SODIUM CHLORIDE (POUR BTL) OPTIME
TOPICAL | Status: DC | PRN
  Administered 2021-07-01: 1000 mL

## 2021-07-01 MED ORDER — EPINEPHRINE PF 1 MG/ML IJ SOLN
INTRAMUSCULAR | Status: DC | PRN
  Administered 2021-07-01: 1 mg

## 2021-07-01 MED ORDER — PROPOFOL 10 MG/ML IV BOLUS
INTRAVENOUS | Status: AC
  Filled 2021-07-01: qty 20

## 2021-07-01 MED ORDER — PHENYLEPHRINE HCL-NACL 20-0.9 MG/250ML-% IV SOLN
INTRAVENOUS | Status: DC | PRN
  Administered 2021-07-01: 30 ug/min via INTRAVENOUS

## 2021-07-01 MED ORDER — BUPIVACAINE HCL 0.25 % IJ SOLN
INTRAMUSCULAR | Status: DC | PRN
  Administered 2021-07-01: 30 mL

## 2021-07-01 MED ORDER — ORAL CARE MOUTH RINSE: 15.0000 mL | Freq: Once | OROMUCOSAL | Status: AC

## 2021-07-01 MED ORDER — CHLORHEXIDINE GLUCONATE 0.12 % MT SOLN: 15.0000 mL | Freq: Once | OROMUCOSAL | Status: AC

## 2021-07-01 MED ORDER — PROPOFOL 10 MG/ML IV BOLUS
INTRAVENOUS | Status: DC | PRN
  Administered 2021-07-01: 100 mg via INTRAVENOUS

## 2021-07-01 MED ORDER — POVIDONE-IODINE 10 % EX SWAB
2.0000 "application " | Freq: Once | CUTANEOUS | Status: AC
  Administered 2021-07-01: 2 via TOPICAL

## 2021-07-01 MED ORDER — CHLORHEXIDINE GLUCONATE 0.12 % MT SOLN
OROMUCOSAL | Status: AC
  Administered 2021-07-01: 15 mL via OROMUCOSAL
  Filled 2021-07-01: qty 15

## 2021-07-01 MED ORDER — CEFAZOLIN SODIUM-DEXTROSE 2-4 GM/100ML-% IV SOLN: 2.0000 g | INTRAVENOUS | Status: DC

## 2021-07-01 SURGICAL SUPPLY — 53 items
BAG DECANTER FOR FLEXI CONT (MISCELLANEOUS) ×4 IMPLANT
BLADE CLIPPER SURG (BLADE) IMPLANT
BLADE DERMATOME SS (BLADE) ×2 IMPLANT
BNDG ELASTIC 4X5.8 VLCR STR LF (GAUZE/BANDAGES/DRESSINGS) ×1 IMPLANT
BNDG ELASTIC 6X5.8 VLCR STR LF (GAUZE/BANDAGES/DRESSINGS) IMPLANT
BNDG GAUZE ELAST 4 BULKY (GAUZE/BANDAGES/DRESSINGS) ×1 IMPLANT
BRUSH SCRUB EZ PLAIN DRY (MISCELLANEOUS) ×4 IMPLANT
CANISTER SUCT 3000ML PPV (MISCELLANEOUS) IMPLANT
CANISTER WOUND CARE 500ML ATS (WOUND CARE) IMPLANT
COVER SURGICAL LIGHT HANDLE (MISCELLANEOUS) ×2 IMPLANT
DERMACARRIERS GRAFT 1 TO 1.5 (DISPOSABLE) ×2
DRAPE EXTREMITY T 121X128X90 (DISPOSABLE) IMPLANT
DRAPE HALF SHEET 40X57 (DRAPES) ×2 IMPLANT
DRAPE INCISE IOBAN 66X45 STRL (DRAPES) ×2 IMPLANT
DRAPE ORTHO SPLIT 77X108 STRL (DRAPES) ×4
DRAPE SURG ORHT 6 SPLT 77X108 (DRAPES) ×2 IMPLANT
DRSG CALCIUM ALGINATE 4X4 (GAUZE/BANDAGES/DRESSINGS) IMPLANT
DRSG MEPITEL 4X7.2 (GAUZE/BANDAGES/DRESSINGS) ×1 IMPLANT
DRSG OPSITE 6X11 MED (GAUZE/BANDAGES/DRESSINGS) IMPLANT
DRSG PAD ABDOMINAL 8X10 ST (GAUZE/BANDAGES/DRESSINGS) ×2 IMPLANT
DRSG VAC ATS LRG SENSATRAC (GAUZE/BANDAGES/DRESSINGS) IMPLANT
DRSG VAC ATS MED SENSATRAC (GAUZE/BANDAGES/DRESSINGS) ×1 IMPLANT
DRSG VAC ATS SM SENSATRAC (GAUZE/BANDAGES/DRESSINGS) IMPLANT
ELECT REM PT RETURN 9FT ADLT (ELECTROSURGICAL)
ELECTRODE REM PT RTRN 9FT ADLT (ELECTROSURGICAL) IMPLANT
FILTER STRAW FLUID ASPIR (MISCELLANEOUS) ×2 IMPLANT
GAUZE SPONGE 4X4 12PLY STRL (GAUZE/BANDAGES/DRESSINGS) ×3 IMPLANT
GAUZE XEROFORM 5X9 LF (GAUZE/BANDAGES/DRESSINGS) ×1 IMPLANT
GLOVE SURG ENC TEXT LTX SZ7.5 (GLOVE) ×4 IMPLANT
GLOVE SURG UNDER LTX SZ8 (GLOVE) ×4 IMPLANT
GOWN STRL REUS W/ TWL LRG LVL3 (GOWN DISPOSABLE) ×2 IMPLANT
GOWN STRL REUS W/TWL LRG LVL3 (GOWN DISPOSABLE) ×4
GRAFT DERMACARRIERS 1 TO 1.5 (DISPOSABLE) ×1 IMPLANT
HANDPIECE INTERPULSE COAX TIP (DISPOSABLE)
IV NS 1000ML (IV SOLUTION) ×2
IV NS 1000ML BAXH (IV SOLUTION) ×1 IMPLANT
KIT BASIN OR (CUSTOM PROCEDURE TRAY) ×2 IMPLANT
KIT TURNOVER KIT B (KITS) ×2 IMPLANT
MANIFOLD NEPTUNE II (INSTRUMENTS) ×2 IMPLANT
NDL SPNL 18GX3.5 QUINCKE PK (NEEDLE) ×2 IMPLANT
NEEDLE SPNL 18GX3.5 QUINCKE PK (NEEDLE) ×4 IMPLANT
NS IRRIG 1000ML POUR BTL (IV SOLUTION) ×2 IMPLANT
PACK GENERAL/GYN (CUSTOM PROCEDURE TRAY) ×2 IMPLANT
PAD ARMBOARD 7.5X6 YLW CONV (MISCELLANEOUS) ×4 IMPLANT
SET HNDPC FAN SPRY TIP SCT (DISPOSABLE) IMPLANT
STAPLER VISISTAT 35W (STAPLE) IMPLANT
SUT CHROMIC 4 0 PS 2 18 (SUTURE) IMPLANT
SUT PDS AB 3-0 SH 27 (SUTURE) IMPLANT
SYR 50ML LL SCALE MARK (SYRINGE) ×4 IMPLANT
SYR CONTROL 10ML LL (SYRINGE) ×2 IMPLANT
TOWEL GREEN STERILE (TOWEL DISPOSABLE) ×2 IMPLANT
TOWEL GREEN STERILE FF (TOWEL DISPOSABLE) ×2 IMPLANT
UNDERPAD 30X36 HEAVY ABSORB (UNDERPADS AND DIAPERS) ×2 IMPLANT

## 2021-07-01 NOTE — Op Note (Signed)
Operative Note   DATE OF OPERATION: 07/01/2021  SURGICAL DEPARTMENT: Plastic Surgery  PREOPERATIVE DIAGNOSES: Left lower extremity wound  POSTOPERATIVE DIAGNOSES:  same  PROCEDURE: 1.  Surgical preparation for grafting left lower extremity wound totaling 13 x 6 cm 2.  Split-thickness skin graft left lower extremity wound totaling 13 x 6 cm 3.  Wound VAC application left lower extremity totaling 13 x 6 cm  SURGEON: Talmadge Coventry, MD  ASSISTANT: Verdie Shire, PA The advanced practice practitioner (APP) assisted throughout the case.  The APP was essential in retraction and counter traction when needed to make the case progress smoothly.  This retraction and assistance made it possible to see the tissue planes for the procedure.  The assistance was needed for hemostasis, tissue re-approximation and closure of the incision site.   ANESTHESIA:  General.   COMPLICATIONS: None.   INDICATIONS FOR PROCEDURE:  The patient, [name] is a 85 y.o. female born on [birth year], is here for treatment of left lower extremity wound MRN: [id number]  CONSENT:  Informed consent was obtained directly from the patient. Risks, benefits and alternatives were fully discussed. Specific risks including but not limited to bleeding, infection, hematoma, seroma, scarring, pain, contracture, asymmetry, wound healing problems, and need for further surgery were all discussed. The patient did have an ample opportunity to have questions answered to satisfaction.   DESCRIPTION OF PROCEDURE:  The patient was taken to the operating room. SCDs were placed and antibiotics were given.  General anesthesia was administered.  The patient's operative site was prepped and draped in a sterile fashion. A time out was performed and all information was confirmed to be correct.  Started by evaluating the wound.  This was 13 x 6 cm in the anterior aspect of left lower extremity at the junction between the distal and middle  third.  This was entirely healthy granulation tissue.  The wound was then debrided with the bevel of a 10 blade.  Colonized granulation tissue was removed.  Any nonviable tissue was removed as well.  The wound was then copiously irrigated and hemostasis was obtained with cautery.  I then turned my attention to the proximal thigh.  I marked out a 13 cm long area and infiltrated tumescent solution.  This was given time to work.  Dermatome was then used to harvest a split-thickness skin graft at fourteen/1000th inch.  This was meshed 1-1.5 and then inset with staples in the wound.  Mepitel was then cut to put superficial to the graft followed by the black sponge in the wound VAC was applied using Ioban to get a good seal.  Donor site was dressed with Xeroform, 4 x 4's and ABD pads.  The distal lower extremity was wrapped with Kerlix, Ace wrap and a cam boot was applied in the PACU.  The patient tolerated the procedure well.  There were no complications. The patient was allowed to wake from anesthesia, extubated and taken to the recovery room in satisfactory condition.

## 2021-07-01 NOTE — Transfer of Care (Signed)
Immediate Anesthesia Transfer of Care Note  Patient: [name]  Procedure(s) Performed: SKIN GRAFT SPLIT THICKNESS (Left: Leg Lower) APPLICATION OF WOUND VAC (Left: Leg Lower)  Patient Location: PACU  Anesthesia Type:General  Level of Consciousness: awake, alert  and patient cooperative  Airway & Oxygen Therapy: Patient Spontanous Breathing and Patient connected to face mask oxygen  Post-op Assessment: Report given to RN and Post -op Vital signs reviewed and stable  Post vital signs: Reviewed and stable  Last Vitals:  Vitals Value Taken Time  BP 160/79 07/01/21 1631  Temp    Pulse 86 07/01/21 1632  Resp 37 07/01/21 1632  SpO2 100 % 07/01/21 1632  Vitals shown include unvalidated device data.  Last Pain:  Vitals:   07/01/21 1254  PainSc: 0-No pain         Complications: No notable events documented.

## 2021-07-01 NOTE — Anesthesia Postprocedure Evaluation (Signed)
Anesthesia Post Note  Patient: [name]  Procedure(s) Performed: SKIN GRAFT SPLIT THICKNESS (Left: Leg Lower) APPLICATION OF WOUND VAC (Left: Leg Lower)     Patient location during evaluation: PACU Anesthesia Type: General Level of consciousness: awake Pain management: pain level controlled Vital Signs Assessment: post-procedure vital signs reviewed and stable Respiratory status: spontaneous breathing Cardiovascular status: stable Postop Assessment: no apparent nausea or vomiting Anesthetic complications: no   No notable events documented.  Last Vitals:  Vitals:   07/01/21 1645 07/01/21 1700  BP: (!) 148/75 (!) 152/71  Pulse: 77 72  Resp: 17 15  Temp:  36.5 C  SpO2: 99% 98%    Last Pain:  Vitals:   07/01/21 1700  PainSc: 0-No pain                 Acacia Latorre

## 2021-07-01 NOTE — Anesthesia Preprocedure Evaluation (Signed)
Anesthesia Evaluation  Patient identified by MRN, date of birth, ID band Patient awake    Reviewed: Allergy & Precautions, NPO status , Patient's Chart, lab work & pertinent test results  History of Anesthesia Complications (+) PONV  Airway Mallampati: II  TM Distance: >3 FB     Dental   Pulmonary former smoker,    breath sounds clear to auscultation       Cardiovascular hypertension,  Rhythm:Regular Rate:Normal     Neuro/Psych  Headaches,    GI/Hepatic Neg liver ROS, GERD  ,  Endo/Other  negative endocrine ROS  Renal/GU negative Renal ROS     Musculoskeletal   Abdominal   Peds  Hematology   Anesthesia Other Findings   Reproductive/Obstetrics                             Anesthesia Physical Anesthesia Plan  ASA: 2  Anesthesia Plan: General   Post-op Pain Management:    Induction: Intravenous  PONV Risk Score and Plan: Ondansetron, Dexamethasone and Midazolam  Airway Management Planned: LMA  Additional Equipment:   Intra-op Plan:   Post-operative Plan: Extubation in OR  Informed Consent: I have reviewed the patients History and Physical, chart, labs and discussed the procedure including the risks, benefits and alternatives for the proposed anesthesia with the patient or authorized representative who has indicated his/her understanding and acceptance.     Dental advisory given  Plan Discussed with: CRNA and Anesthesiologist  Anesthesia Plan Comments:         Anesthesia Quick Evaluation

## 2021-07-01 NOTE — Anesthesia Postprocedure Evaluation (Signed)
Anesthesia Post Note  Patient: [name]  Procedure(s) Performed: SKIN GRAFT SPLIT THICKNESS (Left: Leg Lower) APPLICATION OF WOUND VAC (Left: Leg Lower)     Anesthesia Type: General Anesthetic complications: no   No notable events documented.  Last Vitals:  Vitals:   07/01/21 1645 07/01/21 1700  BP: (!) 148/75 (!) 152/71  Pulse: 77 72  Resp: 17 15  Temp:  36.5 C  SpO2: 99% 98%    Last Pain:  Vitals:   07/01/21 1700  PainSc: 0-No pain                 [name]

## 2021-07-01 NOTE — Discharge Instructions (Addendum)
Activity As tolerated: NO showers Continue to wear the boot on your leg 24/7. You can remove to adjust, but do not walk without it NO driving No heavy activities  Diet: Regular  Wound Care: Keep dressing clean & dry. You may reapply gauze, ABD pad to your left thigh if you notice a lot of drainage. Keep wound vac on, we will remove this in 1 week at your follow up.   Special Instructions: Call Doctor if any unusual problems occur such as pain, excessive Bleeding, unrelieved Nausea/vomiting, Fever &/or chills   Follow-up appointment: Scheduled for next week.

## 2021-07-01 NOTE — Anesthesia Postprocedure Evaluation (Deleted)
Anesthesia Post Note  Patient: [name]  Procedure(s) Performed: SKIN GRAFT SPLIT THICKNESS (Left: Leg Lower) APPLICATION OF WOUND VAC (Left: Leg Lower)     Anesthesia Post Evaluation No notable events documented.  Last Vitals:  Vitals:   07/01/21 1645 07/01/21 1700  BP: (!) 148/75 (!) 152/71  Pulse: 77 72  Resp: 17 15  Temp:  36.5 C  SpO2: 99% 98%    Last Pain:  Vitals:   07/01/21 1700  PainSc: 0-No pain                 Ellamae Lybeck

## 2021-07-01 NOTE — Interval H&P Note (Signed)
History and Physical Interval Note:  07/01/2021 2:56 PM  [name]  has presented today for surgery, with the diagnosis of Left Leg Wound.  The various methods of treatment have been discussed with the patient and family. After consideration of risks, benefits and other options for treatment, the patient has consented to  Procedure(s) with comments: SKIN GRAFT SPLIT THICKNESS (Left) - 1 hour APPLICATION OF WOUND VAC (Left) as a surgical intervention.  The patient's history has been reviewed, patient examined, no change in status, stable for surgery.  I have reviewed the patient's chart and labs.  Questions were answered to the patient's satisfaction.     Cindra Presume

## 2021-07-01 NOTE — Anesthesia Postprocedure Evaluation (Signed)
Anesthesia Post Note  Patient: [name]  Procedure(s) Performed: SKIN GRAFT SPLIT THICKNESS (Left: Leg Lower) APPLICATION OF WOUND VAC (Left: Leg Lower)     Patient location during evaluation: PACU Anesthesia Type: General Level of consciousness: awake Pain management: pain level controlled Vital Signs Assessment: post-procedure vital signs reviewed and stable Cardiovascular status: stable Postop Assessment: no apparent nausea or vomiting Anesthetic complications: no   No notable events documented.  Last Vitals:  Vitals:   07/01/21 1645 07/01/21 1700  BP: (!) 148/75 (!) 152/71  Pulse: 77 72  Resp: 17 15  Temp:  36.5 C  SpO2: 99% 98%    Last Pain:  Vitals:   07/01/21 1700  PainSc: 0-No pain                 Pj Zehner

## 2021-07-01 NOTE — Progress Notes (Signed)
Orthopedic Tech Progress Note Patient Details:  [name] 1936-03-26 [id number]  OR RN called requesting a SMALL CAM WALKER BOOT, dropped off as they was wheeling patient to PACU   Ortho Devices Type of Ortho Device: CAM walker Ortho Device/Splint Interventions: Ordered   Post Interventions Patient Tolerated: Well Instructions Provided: [facility] [place] 07/01/2021, 4:52 PM

## 2021-07-02 ENCOUNTER — Encounter (HOSPITAL_COMMUNITY): Payer: Self-pay | Admitting: Plastic Surgery

## 2021-07-02 ENCOUNTER — Telehealth: Payer: Self-pay

## 2021-07-02 DIAGNOSIS — Z20822 Contact with and (suspected) exposure to covid-19: Secondary | ICD-10-CM | POA: Diagnosis not present

## 2021-07-02 NOTE — Telephone Encounter (Signed)
Patient is calling in stating that she was prescribed an antibiotic for her bladder infection, and was given an antibiotic for the leg infection. [name] is wondering if it is okay to mix the two antibiotics.

## 2021-07-02 NOTE — Telephone Encounter (Signed)
Left message for patient to call clinic

## 2021-07-08 ENCOUNTER — Telehealth: Payer: Self-pay

## 2021-07-08 NOTE — Telephone Encounter (Signed)
Patient called to say she had surgery last Monday and she is wondering if we can get this black boot off of her earlier than 07/10/2021.  She said her husband is in the hospital and they are going to test him for cancer.  Please call.

## 2021-07-08 NOTE — Telephone Encounter (Signed)
Returned patients call. Her next follow up appointment is [place], 07/10/2021. Advised her Dr. Claudia Desanctis and Catalina Antigua are in surgery tomorrow and there are no openings. She is okay waiting until [place]. Patient understood and agreed with plan.

## 2021-07-10 ENCOUNTER — Other Ambulatory Visit: Payer: Self-pay

## 2021-07-10 ENCOUNTER — Ambulatory Visit (INDEPENDENT_AMBULATORY_CARE_PROVIDER_SITE_OTHER): Payer: Medicare HMO | Admitting: Plastic Surgery

## 2021-07-10 DIAGNOSIS — S81802A Unspecified open wound, left lower leg, initial encounter: Secondary | ICD-10-CM

## 2021-07-10 NOTE — Progress Notes (Signed)
Patient is here status post split-thickness skin graft to the lower leg.  She feels like things are going well.  She has been wearing her VAC which was removed today.  On exam the graft looks to have taken nicely and is nicely adherent.  We will plan to continue to dress this with Xeroform.  The donor site is also healing appropriately and will be dressed similarly.  We will plan to see her in 2 weeks.  She can shower and change dressings daily.  All of her questions were answered.

## 2021-07-10 NOTE — Telephone Encounter (Signed)
Patient came in the office and saw Dr. Claudia Desanctis.
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
## 2021-07-15 ENCOUNTER — Telehealth: Payer: Self-pay | Admitting: *Deleted

## 2021-07-15 ENCOUNTER — Other Ambulatory Visit: Payer: Self-pay | Admitting: Family Medicine

## 2021-07-15 ENCOUNTER — Ambulatory Visit (INDEPENDENT_AMBULATORY_CARE_PROVIDER_SITE_OTHER): Payer: Medicare HMO | Admitting: Pharmacist

## 2021-07-15 DIAGNOSIS — E785 Hyperlipidemia, unspecified: Secondary | ICD-10-CM

## 2021-07-15 DIAGNOSIS — I10 Essential (primary) hypertension: Secondary | ICD-10-CM

## 2021-07-15 NOTE — Telephone Encounter (Signed)
Received Order Status Notification from [employer] stating:[facility] will be contacting the patient with pricing options.  Their insurance plan does not cover the supplies ordered under their benefits as tape cannot be billed alone.  Thank you for your patience!//AB/CMA

## 2021-07-15 NOTE — Telephone Encounter (Signed)
Received on (07/10/21) a Urgent Request via of fax from [facility].  Requesting Medical Records for most recent wound clinical progress notes with measurements to continue NPWT services with our mutual patient.  Faxed recent office notes and Op notes stating (that VAC was removed today),and measurements:13x6cm to [facility].  Confirmation received and copy scanned into the chart.//AB/CMA

## 2021-07-15 NOTE — Telephone Encounter (Signed)
Faxed supply order, demographics, recent insurance cards to [place] for supplies for the patient.    Supplies:Medipore Tape-(4x10)-Daily   Confirmation received and copy scanned into the chart.//AB/CMA

## 2021-07-16 DIAGNOSIS — Z20822 Contact with and (suspected) exposure to covid-19: Secondary | ICD-10-CM | POA: Diagnosis not present

## 2021-07-16 NOTE — Patient Instructions (Addendum)
Visit Information   Goals Addressed             This Visit's Progress    Track and Manage My Blood Pressure-Hypertension       Timeframe:  Long-Range Goal Priority:  High Start Date:  07/15/21                           Expected End Date:  01/13/22                     Follow Up Date 10/13/21    - check blood pressure weekly - choose a place to take my blood pressure (home, clinic or office, retail store) - write blood pressure results in a log or diary    Why is this important?   You won't feel high blood pressure, but it can still hurt your blood vessels.  High blood pressure can cause heart or kidney problems. It can also cause a stroke.  Making lifestyle changes like losing a little weight or eating less salt will help.  Checking your blood pressure at home and at different times of the day can help to control blood pressure.  If the doctor prescribes medicine remember to take it the way the doctor ordered.  Call the office if you cannot afford the medicine or if there are questions about it.     Notes:        Patient Care Plan: CCM Pharmacy Care Plan     Problem Identified: HLD HTN OA Stress incontinence GERD MDD   Priority: High     Long-Range Goal: Disease Management   Start Date: 12/12/2020  Expected End Date: 12/12/2021  Recent Progress: On track  Priority: High  Note:   Current Barriers:  n/a  Pharmacist Clinical Goal(s):  Patient will contact provider office for questions/concerns as evidenced notation of same in electronic health record through collaboration with PharmD and provider.   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 No recent falls reported by patient.  -Educated on Symptoms of hypotension and importance of maintaining adequate hydration; -Counseled to monitor BP at home, document, and provide log at future appointments -Recommended to continue current medication  Update 07/15/21 Has not been checking at home lately.  Elevated BP last OV, however, her dog was having an operation that caused her to be worried.  Denies any dizziness or HA's.  Has upcoming appointment with wound doc on 12/15.  They will check at this visit.  Have asked she check once weekly or as able to keep an eye on it.  Discussed BP goals. No changes to medication now, continue to monitor.   Hyperlipidemia: (LDL goal < 100) -Controlled -On asa daily - denies CAD/Stroke Hx -Current treatment: Vytorin 10-20 mg once daily -Review of side effects - no problems noted -Educated on Cholesterol goals;  -Recommended to continue current medication  Update 07/15/21 Continues adherence with meds.  We do not show updated lipids since 2019.  Recommended patient make appointment for fasting labs and physical with Dr. Yong Channel so we can get these routine labs completed. No changes to meds at this time.  Osteopenia (Goal minimize symptoms) -Controlled -Last DEXA Scan: 2020 -Patient is not a candidate for pharmacologic treatment -Current treatment  Will start Vitamin D3 1000 units -Medications previously tried:   -Recommend [phone] units of vitamin D daily. Recommend weight-bearing and muscle strengthening exercises for building and maintaining bone density.  MDD (Goal: ensure medication) -Controlled -Feels depression has been well controlled, no side  effects. Pain management has been reasonable following spinal surgery 2019, knows not to over do it now. Current treatment  Venlafaxine XR 150 mg twice daily  -Reviewed side effects - no problems noted, sleep has been good as long as trazodone 25 mg is being used -Recommended to continue current medication  Patient Goals/Self-Care Activities Patient will:  - target a minimum of 150 minutes of moderate intensity exercise weekly  Follow Up Plan: [place] f/u telephone visit 6-8 months. 2 months BP CALL CMA Medication Assistance: None required.  Patient affirms current coverage meets needs.         Patient verbalizes understanding of instructions provided today and agrees to view in [place].  Telephone follow up appointment with pharmacy team member scheduled for: 6 months  Edythe Clarity, [place]

## 2021-07-19 ENCOUNTER — Telehealth: Payer: Self-pay | Admitting: Plastic Surgery

## 2021-07-19 NOTE — Telephone Encounter (Signed)
[name] with [employer] called and they were wondering if the wound vac has been officially discontinued? She said that the company continues to call her requesting an extension for the wound vac, saying patient still requires it.   I told her I know the patient is not using it because she has called trying to make sure the company comes to pick up the wound vac so that she doesn't get charged.  They just need to know exactly what they mean for the patient to have ordered at the moment.  Please follow up with [name] [facility]) to clarify at [phone].

## 2021-07-22 ENCOUNTER — Telehealth: Payer: Self-pay

## 2021-07-22 NOTE — Telephone Encounter (Signed)
Patient states attorney will be faxing over POA to be signed off on by Dr. Yong Channel stating patient is of sound mind.    Last OV was 06/18/2020 Next OV is 12/23/2021

## 2021-07-23 NOTE — Telephone Encounter (Signed)
Will be on the look out for form. °

## 2021-07-24 NOTE — Telephone Encounter (Signed)
Spoke to [place] was picked up about a week and a half ago. I called Otila Kluver with [employer] and left a message to inform it has been officially discontinued.

## 2021-07-25 ENCOUNTER — Ambulatory Visit: Payer: Medicare HMO | Admitting: Plastic Surgery

## 2021-07-30 DIAGNOSIS — Z20822 Contact with and (suspected) exposure to covid-19: Secondary | ICD-10-CM | POA: Diagnosis not present

## 2021-07-31 ENCOUNTER — Ambulatory Visit: Payer: Medicare HMO | Admitting: Plastic Surgery

## 2021-08-07 ENCOUNTER — Ambulatory Visit (INDEPENDENT_AMBULATORY_CARE_PROVIDER_SITE_OTHER): Payer: Medicare HMO | Admitting: Plastic Surgery

## 2021-08-07 ENCOUNTER — Other Ambulatory Visit: Payer: Self-pay

## 2021-08-07 ENCOUNTER — Encounter: Payer: Self-pay | Admitting: Plastic Surgery

## 2021-08-07 ENCOUNTER — Ambulatory Visit: Payer: Medicare HMO | Admitting: Plastic Surgery

## 2021-08-07 DIAGNOSIS — S81802A Unspecified open wound, left lower leg, initial encounter: Secondary | ICD-10-CM

## 2021-08-07 NOTE — Progress Notes (Signed)
Patient presents about a month out from skin graft to her left lower extremity.  Overall she feels like things are coming along well.  On exam everything is totally epithelialized.  She has quite a bit of dryness to both lower extremities.  There is a small wound distal to her previous wound which is from rubbing from her shoes.  I recommended moisturizer and/or Vaseline to these areas to help with the dryness.  We will plan on seeing her back on an as-needed basis.  All of her questions were answered.

## 2021-08-10 DIAGNOSIS — I10 Essential (primary) hypertension: Secondary | ICD-10-CM | POA: Diagnosis not present

## 2021-08-10 DIAGNOSIS — E785 Hyperlipidemia, unspecified: Secondary | ICD-10-CM

## 2021-08-13 DIAGNOSIS — Z20822 Contact with and (suspected) exposure to covid-19: Secondary | ICD-10-CM | POA: Diagnosis not present

## 2021-08-20 DIAGNOSIS — Z20822 Contact with and (suspected) exposure to covid-19: Secondary | ICD-10-CM | POA: Diagnosis not present

## 2021-08-27 DIAGNOSIS — Z20822 Contact with and (suspected) exposure to covid-19: Secondary | ICD-10-CM | POA: Diagnosis not present

## 2021-08-29 DIAGNOSIS — Z20822 Contact with and (suspected) exposure to covid-19: Secondary | ICD-10-CM | POA: Diagnosis not present

## 2021-09-03 DIAGNOSIS — Z20822 Contact with and (suspected) exposure to covid-19: Secondary | ICD-10-CM | POA: Diagnosis not present

## 2021-09-05 DIAGNOSIS — Z20822 Contact with and (suspected) exposure to covid-19: Secondary | ICD-10-CM | POA: Diagnosis not present

## 2021-09-10 DIAGNOSIS — Z20822 Contact with and (suspected) exposure to covid-19: Secondary | ICD-10-CM | POA: Diagnosis not present

## 2021-09-12 DIAGNOSIS — Z20822 Contact with and (suspected) exposure to covid-19: Secondary | ICD-10-CM | POA: Diagnosis not present

## 2021-09-17 ENCOUNTER — Telehealth: Payer: Self-pay

## 2021-09-17 NOTE — Telephone Encounter (Signed)
Routed back to front desk for completion.

## 2021-09-17 NOTE — Telephone Encounter (Signed)
[name] from [employer] called she wants to know if we received a medical records request form call [phone] [phone]

## 2021-09-19 DIAGNOSIS — Z20822 Contact with and (suspected) exposure to covid-19: Secondary | ICD-10-CM | POA: Diagnosis not present

## 2021-09-27 ENCOUNTER — Telehealth: Payer: Self-pay | Admitting: Physician Assistant
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
## 2021-10-03 ENCOUNTER — Telehealth: Payer: Self-pay
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
## 2021-10-04 NOTE — Telephone Encounter (Signed)
Pt's husband confirmed that they were scheduled for a f/u for next Wed, 3/1 with Donna Christen, [place] and that pt can wait until then. He also confirmed that pt's wound is actually getting better.

## 2021-10-04 NOTE — Telephone Encounter (Signed)
Please see 2/23 telephone note x updates.

## 2021-10-09 ENCOUNTER — Other Ambulatory Visit: Payer: Self-pay

## 2021-10-09 ENCOUNTER — Ambulatory Visit (INDEPENDENT_AMBULATORY_CARE_PROVIDER_SITE_OTHER): Payer: Medicare HMO | Admitting: Plastic Surgery

## 2021-10-09 ENCOUNTER — Ambulatory Visit: Payer: Medicare HMO | Admitting: Plastic Surgery

## 2021-10-09 DIAGNOSIS — S81802A Unspecified open wound, left lower leg, initial encounter: Secondary | ICD-10-CM | POA: Diagnosis not present

## 2021-10-09 DIAGNOSIS — S81801A Unspecified open wound, right lower leg, initial encounter: Secondary | ICD-10-CM

## 2021-10-09 NOTE — Progress Notes (Signed)
? ?  Referring Provider ?Marin Olp, MD ?[place],  [place] [address]  ? ?CC:  ?Chief Complaint  ?Patient presents with  ? Follow-up  ?   ? ?[name] is an 86 y.o. female.  ?HPI: Patient presents just over 3 months postop from split-thickness skin graft to left lower extremity for traumatic wound.  She feels like things were going well but feels like the skin may have dried out and opened up down closer to the ankle.  She just wanted to get it checked.  She feels like is gotten better over the past week. ? ?Review of Systems ?General: Denies fevers and chills ? ?Physical Exam ?Vitals with BMI 07/01/2021 07/01/2021 07/01/2021  ?Height - - -  ?Weight - - -  ?BMI - - -  ?Systolic [phone]  ?Diastolic 71 75 79  ?Pulse 72 77 89  ?  ?General:  No acute distress,  Alert and oriented, Non-Toxic, Normal speech and affect ?Examination shows a well-healed split-thickness skin graft recipient site.  Just inferior to that is a 3 x 4 cm superficial wound with no drainage and no signs of infection.  It looks to be epithelializing.  The skin overall looks dry. ? ?Assessment/Plan ?Patient presents with a small wound in the ankle after split-thickness skin graft of the left lower extremity.  I suspect this was just from rubbing or scratching from the skin drying out.  We will plan to do Vaseline until the skin fully epithelializes and then do lotion to keep everything moisturized after that.  I suspect this will heal up in the next week or 2.  I offered to see them back with a have a number of other health concerns at this point and will just call us if they have any trouble.  All her questions were answered. ? ?Cindra Presume ?10/09/2021, 10:45 AM  ? ? ?    ?

## 2021-10-14 DIAGNOSIS — Z20822 Contact with and (suspected) exposure to covid-19: Secondary | ICD-10-CM | POA: Diagnosis not present

## 2021-10-19 ENCOUNTER — Other Ambulatory Visit: Payer: Self-pay | Admitting: Family Medicine

## 2021-10-21 DIAGNOSIS — Z20822 Contact with and (suspected) exposure to covid-19: Secondary | ICD-10-CM | POA: Diagnosis not present

## 2021-10-21 NOTE — Progress Notes (Signed)
Phone [phone] In person visit   Subjective:   [name] is a 86 y.o. year old very pleasant female patient who presents for/with See problem oriented charting Chief Complaint  Patient presents with   Back Pain   Dizziness    Pt has noticed dizziness for a few months.    This visit occurred during the SARS-CoV-2 public health emergency.  Safety protocols were in place, including screening questions prior to the visit, additional usage of staff PPE, and extensive cleaning of exam room while observing appropriate contact time as indicated for disinfecting solutions.   Past Medical History-  Patient Active Problem List   Diagnosis Date Noted   Liver lesion 11/10/2017    Priority: High   Memory changes 03/06/2017    Priority: Medium    Lumbar stenosis with neurogenic claudication 03/20/2016    Priority: Medium    Scalp pain 10/22/2015    Priority: Medium    Macular degeneration of right eye 03/03/2013    Priority: Medium    Glaucoma 03/03/2013    Priority: Medium    Hyperlipidemia 03/14/2009    Priority: Medium    Depression 10/19/2007    Priority: Medium    Essential hypertension 10/19/2007    Priority: Medium    GASTROESOPHAGEAL REFLUX DISEASE 10/19/2007    Priority: Medium    Chest pain 01/24/2018    Priority: Low   Post-traumatic headache 10/11/2015    Priority: Low   Routine health maintenance 03/06/2013    Priority: Low   Osteoarthritis 10/19/2007    Priority: Low   STRESS INCONTINENCE 10/19/2007    Priority: Low   Allergic rhinitis 10/19/2007    Priority: Low   Major depressive disorder with single episode, in full remission (HCC) 10/22/2021   Aortic atherosclerosis (HCC) 01/17/2021   Osteopenia of neck of right femur 03/29/2019   Nausea & vomiting 10/31/2017   Chronic daily headache 07/08/2017    Medications- reviewed and updated Current Outpatient Medications  Medication Sig Dispense Refill   acetaminophen (TYLENOL) 500 MG tablet Take  1,000 mg by mouth every 6 (six) hours as needed for moderate pain or headache.     ALPRAZolam (XANAX) 0.5 MG tablet TAKE 1/2 TO 1 TABLET(0.25 TO 0.5 MG) BY MOUTH TWICE DAILY AS NEEDED FOR ANXIETY (Patient taking differently: Take 0.5 mg by mouth 2 (two) times daily as needed for anxiety.) 20 tablet 0   aspirin 81 MG tablet Take 81 mg by mouth daily.     brimonidine-timolol (COMBIGAN) 0.2-0.5 % ophthalmic solution Place 1 drop into both eyes every 12 (twelve) hours.     clobetasol ointment (TEMOVATE) 0.05 % apply to affected area twice a day if needed DO NOT USE FOR MORE THAN 5 DAYS 30 g 0   ezetimibe-simvastatin (VYTORIN) 10-20 MG tablet TAKE 1 TABLET BY MOUTH EVERY DAY 90 tablet 3   Multiple Vitamins-Minerals (MULTIVITAMIN ADULT) CHEW Chew 2 each by mouth daily. Nature Made for Her     nadolol (CORGARD) 40 MG tablet TAKE 1 TABLET BY MOUTH AT BEDTIME 90 tablet 2   nitrofurantoin, macrocrystal-monohydrate, (MACROBID) 100 MG capsule Take 1 capsule (100 mg total) by mouth 2 (two) times daily. 14 capsule 0   omeprazole (PRILOSEC) 40 MG capsule TAKE 1 CAPSULE(40 MG) BY MOUTH DAILY (Patient taking differently: Take 40 mg by mouth daily.) 90 capsule 0   ondansetron (ZOFRAN) 4 MG tablet Take 1 tablet (4 mg total) by mouth every 8 (eight) hours as needed for nausea or vomiting.  20 tablet 0   sodium chloride (OCEAN) 0.65 % SOLN nasal spray Place 1 spray into both nostrils 2 (two) times daily as needed for congestion.     traZODone (DESYREL) 100 MG tablet TAKE 1/4 TO 1/2 TABLET BY MOUTH AT BEDTIME IF NEEDED FOR SLEEP. 45 tablet 3   venlafaxine XR (EFFEXOR-XR) 150 MG 24 hr capsule TAKE 1 CAPSULE BY MOUTH TWICE A DAY 180 capsule 3   Zinc Oxide (DESITIN) 13 % CREA Apply 1 application topically daily as needed (itching).     No current facility-administered medications for this visit.     Objective:  BP 136/72   Pulse 68   Temp 97.7 F (36.5 C)   Ht 5' (1.524 m)   Wt 147 lb (66.7 kg)   LMP 08/11/1974    SpO2 98%   BMI 28.71 kg/m  Gen: NAD, resting comfortably CV: RRR no murmurs rubs or gallops Lungs: CTAB no crackles, wheeze, rhonchi Ext: no edema Skin: warm, dry In the left low back there is a 7.5 x 7.5 cm subcutaneous or deeper growth Neuro: CN II-XII intact, sensation and reflexes normal throughout, 5/5 muscle strength in bilateral upper and lower extremities. Normal finger to nose. Normal rapid alternating movements. No pronator drift. Normal romberg. Normal gait.      Assessment and Plan   # Vertigo S: Vertigo started (baseline balance issues but this is different). Symptoms started about 10 days ago were rather severe.  Occurred with head movement such as rolling over in bed or turning head or leaning down. Now has had several days without issues. Episodes just a few seconds.  -has had slight tinnitus at times in recent weeks but not sure if lined up with period of vertgo. No hearing loss since this.  A/P: certainly sounds like BPPV. Did have some tinnitius but position change related vertigo that resolves with staying still and lasts only a few seconds sounds more like BPPV than meniere's (plus vertigo timecourse is unclear) -Reassuring neurological exam today  #Lower back Pain  S:Pt with history of back surgery 10/2017 L# L4 and L4 LF XLIF and L3-L5 percutaneous pedicle screw fixation (most recently has seen Dr. Jake Samples once Dr. Bettina Gavia retired and back pain for years).  hx of lumbar stenosis with neurogenic claudication / degenerative disc disease that improved after surgery but she has had ongoing lower back pain despite surgery  Today, reports more consistent pain. Pain is in low back does not radiate into the legs. Gets pin like sensation in the back. Pain often 6/10 but can get up to 10/10 with stnading on feet for a while. fell out of bed about a month to 6 weeks ago. Needs to sleep away from side of bed. More persistent pain even before the fall- no other injuries reported and  didn't worsen after the fall.  - taking tylenol for pain.  - tizanidine from Dr. Jake Samples in the past -She is not requesting pain medicine at this time -Has had improvement in the past with physical therapy  Patient also with a growth noted on her left low back A/P: Patient with chronic low back pain with recent worsening in frequency-pretty constant at this point - We originally opted for physical therapy trial and if not improving refer to sports medicine.  We did refer her to physical therapy at our clinic - After examining her back there is a 7.5 x 7.5 cm subcutaneous mass-possible lipoma?  I think she could benefit from musculoskeletal ultrasound-also considered  referring to consider imaging for ultrasound-I am certainly still open to this option if sports medicine thinks needs further investigation  #hyperlipidemia with aortic atherosclerosis S: Medication:ezetimibe-simvastatin 10-20 mg  . Reports vertigo interestingly enough was better when missed vytorin - does not sound slike symptoms worsened when she restarted this Lab Results  Component Value Date   CHOL 144 01/25/2018   HDL 41 01/25/2018   LDLCALC 78 01/25/2018   LDLDIRECT 84.0 03/06/2017   TRIG 125 01/25/2018   CHOLHDL 3.5 01/25/2018   A/P: Cholesterol not recently checked-update lipid panel with labs-for now continue current medication.  Ideally with aortic atherosclerosis LDL would be under 70 but after age not feel strongly about increasing strength of statin unless LDL perhaps over 100  #hypertension S: medication: nadolol 40 mg at bedtime BP Readings from Last 3 Encounters:  10/22/21 136/72  07/01/21 (!) 152/71  06/25/21 108/67  A/P:  Controlled. Continue current medications.    # Depression S: Medication: Venlafaxine 150 mg Depression screen [facility] 2/9 10/22/2021 06/25/2021 06/25/2021  Decreased Interest 1 0 0  Down, Depressed, Hopeless 3 0 0  PHQ - 2 Score 4 0 0  Altered sleeping 0 0 -  Tired, decreased energy 0 0  -  Change in appetite 0 0 -  Feeling bad or failure about yourself  0 0 -  Trouble concentrating 0 0 -  Moving slowly or fidgety/restless 0 0 -  Suicidal thoughts 0 0 -  PHQ-9 Score 4 0 -  Difficult doing work/chores Not difficult at all - -  Some recent data might be hidden  A/P: Technically reasonably well-controlled with PHQ-9 under 5-unfortunately asking patient prompted multiple tears as she reflected on chemotherapy her husband is going through and her concerned about losing him-I apologized to patient and reflected should have delayed PHQ-9 testing until future visit -Patient has alprazolam available for anxiety but reports very sparingly using this and no falls with use   Recommended follow up: Return for next already scheduled visit or sooner if needed. Future Appointments  Date Time Provider Department Center  12/23/2021  2:40 PM Shelva Majestic, MD [facility] [place]  01/14/2022  3:45 PM [facility] PHARMACIST [facility]    Lab/Order associations: not fasting   ICD-10-CM   1. Benign paroxysmal positional vertigo, unspecified laterality  H81.10     2. Back pain, unspecified back location, unspecified back pain laterality, unspecified chronicity  M54.9 Ambulatory referral to Physical Therapy    Ambulatory referral to Sports Medicine    CANCELED: Ambulatory referral to Sports Medicine    3. Hyperlipidemia, unspecified hyperlipidemia type  E78.5 CBC with Differential/Platelet    Comprehensive metabolic panel    Lipid panel    4. Major depressive disorder with single episode, in full remission (HCC)  F32.5     5. Aortic atherosclerosis (HCC) Chronic I70.0     6. Palpable mass of lower back  R22.2 Ambulatory referral to Sports Medicine     Liz Beach as a scribe for Tana Conch, MD.,have documented all relevant documentation on the behalf of Tana Conch, MD,as directed by  Tana Conch, MD while in the presence of Tana Conch, MD.   I, Tana Conch,  MD, have reviewed all documentation for this visit. The documentation on 10/22/21 for the exam, diagnosis, procedures, and orders are all accurate and complete.   Return precautions advised.  Tana Conch, MD

## 2021-10-22 ENCOUNTER — Ambulatory Visit (INDEPENDENT_AMBULATORY_CARE_PROVIDER_SITE_OTHER): Payer: Medicare HMO | Admitting: Family Medicine

## 2021-10-22 ENCOUNTER — Encounter: Payer: Self-pay | Admitting: Family Medicine

## 2021-10-22 VITALS — BP 136/72 | HR 68 | Temp 97.7°F | Ht 60.0 in | Wt 147.0 lb

## 2021-10-22 DIAGNOSIS — E785 Hyperlipidemia, unspecified: Secondary | ICD-10-CM

## 2021-10-22 DIAGNOSIS — R222 Localized swelling, mass and lump, trunk: Secondary | ICD-10-CM

## 2021-10-22 DIAGNOSIS — M549 Dorsalgia, unspecified: Secondary | ICD-10-CM

## 2021-10-22 DIAGNOSIS — I700 Atherosclerosis of aorta: Secondary | ICD-10-CM

## 2021-10-22 DIAGNOSIS — F325 Major depressive disorder, single episode, in full remission: Secondary | ICD-10-CM | POA: Diagnosis not present

## 2021-10-22 DIAGNOSIS — R42 Dizziness and giddiness: Secondary | ICD-10-CM

## 2021-10-22 DIAGNOSIS — H8110 Benign paroxysmal vertigo, unspecified ear: Secondary | ICD-10-CM | POA: Diagnosis not present

## 2021-10-22 HISTORY — DX: Major depressive disorder, single episode, in full remission: F32.5

## 2021-10-22 NOTE — Patient Instructions (Addendum)
Schedule a visit with physical therapy at the desk before you leave ?-if not improving within several sessions let me refer you to sports medicine ? ?See [place] back if vertigo returns ? ?Team please update full phq9 and let me know if substantially elevated   ? ?Please stop by lab before you go ?If you have mychart- we will send your results within 3 business days of [place] receiving them.  ?If you do not have mychart- we will call you about results within 5 business days of [place] receiving them.  ?*please also note that you will see labs on mychart as soon as they post. I will later go in and write notes on them- will say "notes from Dr. Yong Channel"  ? ?Recommended follow up: Return for next already scheduled visit or sooner if needed. In may ?

## 2021-10-23 LAB — CBC WITH DIFFERENTIAL/PLATELET
Basophils Absolute: 0.1 10*3/uL (ref 0.0–0.1)
Basophils Relative: 1.1 % (ref 0.0–3.0)
Eosinophils Absolute: 0.1 10*3/uL (ref 0.0–0.7)
Eosinophils Relative: 1.1 % (ref 0.0–5.0)
HCT: 38.8 % (ref 36.0–46.0)
Hemoglobin: 12.7 g/dL (ref 12.0–15.0)
Lymphocytes Relative: 24.7 % (ref 12.0–46.0)
Lymphs Abs: 1.7 10*3/uL (ref 0.7–4.0)
MCHC: 32.6 g/dL (ref 30.0–36.0)
MCV: 93.6 fl (ref 78.0–100.0)
Monocytes Absolute: 0.5 10*3/uL (ref 0.1–1.0)
Monocytes Relative: 6.9 % (ref 3.0–12.0)
Neutro Abs: 4.6 10*3/uL (ref 1.4–7.7)
Neutrophils Relative %: 66.2 % (ref 43.0–77.0)
Platelets: 219 10*3/uL (ref 150.0–400.0)
RBC: 4.14 Mil/uL (ref 3.87–5.11)
RDW: 15.7 % — ABNORMAL HIGH (ref 11.5–15.5)
WBC: 6.9 10*3/uL (ref 4.0–10.5)

## 2021-10-23 LAB — COMPREHENSIVE METABOLIC PANEL
ALT: 11 U/L (ref 0–35)
AST: 17 U/L (ref 0–37)
Albumin: 4.1 g/dL (ref 3.5–5.2)
Alkaline Phosphatase: 65 U/L (ref 39–117)
BUN: 15 mg/dL (ref 6–23)
CO2: 32 mEq/L (ref 19–32)
Calcium: 9.6 mg/dL (ref 8.4–10.5)
Chloride: 104 mEq/L (ref 96–112)
Creatinine, Ser: 0.79 mg/dL (ref 0.40–1.20)
GFR: 68.88 mL/min (ref >60.00)
Glucose, Bld: 72 mg/dL (ref 70–99)
Potassium: 4.5 mEq/L (ref 3.5–5.1)
Sodium: 141 mEq/L (ref 135–145)
Total Bilirubin: 0.3 mg/dL (ref 0.2–1.2)
Total Protein: 6.6 g/dL (ref 6.0–8.3)

## 2021-10-23 LAB — LIPID PANEL
Cholesterol: 151 mg/dL (ref 0–200)
HDL: 51.7 mg/dL (ref >39.00)
LDL Cholesterol: 68 mg/dL (ref 0–99)
NonHDL: 99.77
Total CHOL/HDL Ratio: 3
Triglycerides: 161 mg/dL — ABNORMAL HIGH (ref 0.0–149.0)
VLDL: 32.2 mg/dL (ref 0.0–40.0)

## 2021-10-24 ENCOUNTER — Telehealth: Payer: Self-pay | Admitting: Family Medicine

## 2021-10-24 NOTE — Telephone Encounter (Signed)
Attempted to schedule AWV. Unable to LVM.  Will try at later time.  

## 2021-10-25 ENCOUNTER — Telehealth: Payer: Self-pay

## 2021-10-25 NOTE — Telephone Encounter (Signed)
Patient state her husband is currently in the hospital.    She wanted Dr. Yong Channel to know that she is going to hold off on PT until her husband is better.

## 2021-10-25 NOTE — Telephone Encounter (Signed)
FYI

## 2021-10-28 NOTE — Telephone Encounter (Signed)
That makes sense- hope he is able to get out quickly and get back home  ?

## 2021-10-30 ENCOUNTER — Encounter: Payer: Self-pay | Admitting: Family Medicine

## 2021-10-31 ENCOUNTER — Ambulatory Visit: Payer: Medicare HMO | Admitting: Family Medicine

## 2021-11-04 DIAGNOSIS — Z20822 Contact with and (suspected) exposure to covid-19: Secondary | ICD-10-CM | POA: Diagnosis not present

## 2021-12-09 DIAGNOSIS — Z20822 Contact with and (suspected) exposure to covid-19: Secondary | ICD-10-CM | POA: Diagnosis not present

## 2021-12-17 ENCOUNTER — Emergency Department (HOSPITAL_COMMUNITY): Payer: Medicare HMO

## 2021-12-17 ENCOUNTER — Emergency Department (HOSPITAL_COMMUNITY)
Admission: EM | Admit: 2021-12-17 | Discharge: 2021-12-17 | Disposition: A | Discharge status: Home / Self Care | Payer: Medicare HMO | Attending: Student | Admitting: Student

## 2021-12-17 ENCOUNTER — Other Ambulatory Visit: Payer: Self-pay

## 2021-12-17 ENCOUNTER — Encounter (HOSPITAL_COMMUNITY): Payer: Self-pay

## 2021-12-17 DIAGNOSIS — Z7982 Long term (current) use of aspirin: Secondary | ICD-10-CM | POA: Diagnosis not present

## 2021-12-17 DIAGNOSIS — S0990XA Unspecified injury of head, initial encounter: Secondary | ICD-10-CM

## 2021-12-17 DIAGNOSIS — Z8616 Personal history of COVID-19: Secondary | ICD-10-CM | POA: Diagnosis not present

## 2021-12-17 DIAGNOSIS — Z043 Encounter for examination and observation following other accident: Secondary | ICD-10-CM | POA: Diagnosis not present

## 2021-12-17 DIAGNOSIS — G4489 Other headache syndrome: Secondary | ICD-10-CM | POA: Diagnosis not present

## 2021-12-17 DIAGNOSIS — Z23 Encounter for immunization: Secondary | ICD-10-CM | POA: Insufficient documentation

## 2021-12-17 DIAGNOSIS — R58 Hemorrhage, not elsewhere classified: Secondary | ICD-10-CM | POA: Diagnosis not present

## 2021-12-17 DIAGNOSIS — Z85828 Personal history of other malignant neoplasm of skin: Secondary | ICD-10-CM | POA: Diagnosis not present

## 2021-12-17 DIAGNOSIS — Z79899 Other long term (current) drug therapy: Secondary | ICD-10-CM | POA: Insufficient documentation

## 2021-12-17 DIAGNOSIS — J984 Other disorders of lung: Secondary | ICD-10-CM | POA: Diagnosis not present

## 2021-12-17 DIAGNOSIS — I10 Essential (primary) hypertension: Secondary | ICD-10-CM | POA: Diagnosis not present

## 2021-12-17 DIAGNOSIS — R519 Headache, unspecified: Secondary | ICD-10-CM | POA: Diagnosis not present

## 2021-12-17 DIAGNOSIS — S199XXA Unspecified injury of neck, initial encounter: Secondary | ICD-10-CM | POA: Diagnosis not present

## 2021-12-17 DIAGNOSIS — G319 Degenerative disease of nervous system, unspecified: Secondary | ICD-10-CM | POA: Diagnosis not present

## 2021-12-17 DIAGNOSIS — Z87891 Personal history of nicotine dependence: Secondary | ICD-10-CM | POA: Diagnosis not present

## 2021-12-17 DIAGNOSIS — S0101XA Laceration without foreign body of scalp, initial encounter: Secondary | ICD-10-CM | POA: Diagnosis not present

## 2021-12-17 DIAGNOSIS — M160 Bilateral primary osteoarthritis of hip: Secondary | ICD-10-CM | POA: Diagnosis not present

## 2021-12-17 DIAGNOSIS — I6782 Cerebral ischemia: Secondary | ICD-10-CM | POA: Diagnosis not present

## 2021-12-17 DIAGNOSIS — W19XXXA Unspecified fall, initial encounter: Secondary | ICD-10-CM | POA: Diagnosis not present

## 2021-12-17 LAB — COMPREHENSIVE METABOLIC PANEL
ALT: 15 U/L (ref 0–44)
AST: 21 U/L (ref 15–41)
Albumin: 3.7 g/dL (ref 3.5–5.0)
Alkaline Phosphatase: 61 U/L (ref 38–126)
Anion gap: 6 (ref 5–15)
BUN: 14 mg/dL (ref 8–23)
CO2: 27 mmol/L (ref 22–32)
Calcium: 9.2 mg/dL (ref 8.9–10.3)
Chloride: 107 mmol/L (ref 98–111)
Creatinine, Ser: 0.75 mg/dL (ref 0.44–1.00)
GFR, Estimated: >60 mL/min (ref >60)
Glucose, Bld: 111 mg/dL — ABNORMAL HIGH (ref 70–99)
Potassium: 3.8 mmol/L (ref 3.5–5.1)
Sodium: 140 mmol/L (ref 135–145)
Total Bilirubin: 0.5 mg/dL (ref 0.3–1.2)
Total Protein: 6.5 g/dL (ref 6.5–8.1)

## 2021-12-17 LAB — CBC WITH DIFFERENTIAL/PLATELET
Abs Immature Granulocytes: 0.01 10*3/uL (ref 0.00–0.07)
Basophils Absolute: 0 10*3/uL (ref 0.0–0.1)
Basophils Relative: 1 %
Eosinophils Absolute: 0 10*3/uL (ref 0.0–0.5)
Eosinophils Relative: 0 %
HCT: 40.9 % (ref 36.0–46.0)
Hemoglobin: 13.2 g/dL (ref 12.0–15.0)
Immature Granulocytes: 0 %
Lymphocytes Relative: 21 %
Lymphs Abs: 1.5 10*3/uL (ref 0.7–4.0)
MCH: 31.2 pg (ref 26.0–34.0)
MCHC: 32.3 g/dL (ref 30.0–36.0)
MCV: 96.7 fL (ref 80.0–100.0)
Monocytes Absolute: 0.4 10*3/uL (ref 0.1–1.0)
Monocytes Relative: 6 %
Neutro Abs: 5.3 10*3/uL (ref 1.7–7.7)
Neutrophils Relative %: 72 %
Platelets: 201 10*3/uL (ref 150–400)
RBC: 4.23 MIL/uL (ref 3.87–5.11)
RDW: 13.8 % (ref 11.5–15.5)
WBC: 7.3 10*3/uL (ref 4.0–10.5)
nRBC: 0 % (ref 0.0–0.2)

## 2021-12-17 MED ORDER — TETANUS-DIPHTH-ACELL PERTUSSIS 5-2.5-18.5 LF-MCG/0.5 IM SUSY
0.5000 mL | PREFILLED_SYRINGE | Freq: Once | INTRAMUSCULAR | Status: AC
  Administered 2021-12-17: 0.5 mL via INTRAMUSCULAR
  Filled 2021-12-17: qty 0.5

## 2021-12-17 MED ORDER — LIDOCAINE HCL (PF) 1 % IJ SOLN
INTRAMUSCULAR | Status: AC
  Administered 2021-12-17: 30 mL
  Filled 2021-12-17: qty 30

## 2021-12-17 NOTE — ED Triage Notes (Signed)
Trip and fall while walking. Dog pulled her over. Denies LOC. Denies taking blood thinner. Pt hit head. Alert and oriented. Lacerations to back of head.  ?

## 2021-12-17 NOTE — ED Provider Notes (Signed)
?[place] ?Provider Note ? ?CSN: [id number] ?Arrival date & time: 12/17/21 1548 ? ?Chief Complaint(s) ?Fall ? ?HPI ?[name] is a 86 y.o. female who presents emergency department for evaluation of a mechanical fall and a scalp laceration.  Patient states that she was walking her dog when she, fell backwards and struck the back of her head on the ground.  She had no associated loss of consciousness, nausea, vomiting, ataxia, confusion, numbness, tingling, weakness or other systemic or traumatic complaints.  Patient arrives alert and oriented answering all questions appropriately.  No blood thinner use. ? ? ?Past Medical History ?Past Medical History:  ?Diagnosis Date  ? Anxiety   ? Cancer [facility])   ? basal cell on nose  ? Colon polyps   ? Complication of anesthesia   ? takes longer to wake up- admitted overnight after colonoscopy  ? COVID 05/2021  ? mild case  ? Depression   ? GERD (gastroesophageal reflux disease)   ? Glaucoma   ? Headache   ? migraines  ? Hx: UTI (urinary tract infection)   ? Hypercholesteremia   ? Hypertension   ? Lichen simplex chronicus   ? Lumbar stenosis with neurogenic claudication   ? Macular degeneration   ? Medical history non-contributory   ? Osteoarthritis   ? Osteopenia   ? of the hip  ? PONV (postoperative nausea and vomiting)   ? Stress incontinence, female   ? Unsteady gait   ? Wears glasses   ? Whooping cough   ? as a baby  ? ?Patient Active Problem List  ? Diagnosis Date Noted  ? Major depressive disorder with single episode, in full remission ([place]) 10/22/2021  ? Aortic atherosclerosis ([place]) 01/17/2021  ? Osteopenia of neck of right femur 03/29/2019  ? Chest pain 01/24/2018  ? Liver lesion 11/10/2017  ? Nausea & vomiting 10/31/2017  ? Chronic daily headache 07/08/2017  ? Memory changes 03/06/2017  ? Lumbar stenosis with neurogenic claudication 03/20/2016  ? Scalp pain 10/22/2015  ? Post-traumatic headache 10/11/2015  ? Routine health  maintenance 03/06/2013  ? Macular degeneration of right eye 03/03/2013  ? Glaucoma 03/03/2013  ? Hyperlipidemia 03/14/2009  ? Depression 10/19/2007  ? Essential hypertension 10/19/2007  ? GASTROESOPHAGEAL REFLUX DISEASE 10/19/2007  ? Osteoarthritis 10/19/2007  ? STRESS INCONTINENCE 10/19/2007  ? Allergic rhinitis 10/19/2007  ? ?Home Medication(s) ?Prior to Admission medications   ?Medication Sig Start Date End Date Taking? Authorizing Provider  ?acetaminophen (TYLENOL) 500 MG tablet Take 1,000 mg by mouth every 6 (six) hours as needed for moderate pain or headache.    [provider]  ?ALPRAZolam (XANAX) 0.5 MG tablet TAKE 1/2 TO 1 TABLET(0.25 TO 0.5 MG) BY MOUTH TWICE DAILY AS NEEDED FOR ANXIETY ?Patient taking differently: Take 0.5 mg by mouth 2 (two) times daily as needed for anxiety. 03/25/21   Marin Olp, MD  ?aspirin 81 MG tablet Take 81 mg by mouth daily.    [provider]  ?brimonidine-timolol (COMBIGAN) 0.2-0.5 % ophthalmic solution Place 1 drop into both eyes every 12 (twelve) hours.    [provider]  ?clobetasol ointment (TEMOVATE) 0.05 % apply to affected area twice a day if needed DO NOT USE FOR MORE THAN 5 DAYS 10/15/20   Marin Olp, MD  ?ezetimibe-simvastatin (VYTORIN) 10-20 MG tablet TAKE 1 TABLET BY MOUTH EVERY DAY 05/09/21   Marin Olp, MD  ?Multiple Vitamins-Minerals (MULTIVITAMIN ADULT) CHEW Chew 2 each by mouth daily.  Nature Made for Her    [provider]  ?nadolol (CORGARD) 40 MG tablet TAKE 1 TABLET BY MOUTH AT BEDTIME 10/21/21   Marin Olp, MD  ?nitrofurantoin, macrocrystal-monohydrate, (MACROBID) 100 MG capsule Take 1 capsule (100 mg total) by mouth 2 (two) times daily. 06/25/21   Vivi Barrack, MD  ?omeprazole (PRILOSEC) 40 MG capsule TAKE 1 CAPSULE(40 MG) BY MOUTH DAILY ?Patient taking differently: Take 40 mg by mouth daily. 05/30/19   Marin Olp, MD  ?ondansetron (ZOFRAN) 4 MG tablet Take 1 tablet (4 mg total) by  mouth every 8 (eight) hours as needed for nausea or vomiting. 06/26/21   Scheeler, Carola Rhine, PA-C  ?sodium chloride (OCEAN) 0.65 % SOLN nasal spray Place 1 spray into both nostrils 2 (two) times daily as needed for congestion.    [provider]  ?traZODone (DESYREL) 100 MG tablet TAKE 1/4 TO 1/2 TABLET BY MOUTH AT BEDTIME IF NEEDED FOR SLEEP. 07/15/21   Marin Olp, MD  ?venlafaxine XR (EFFEXOR-XR) 150 MG 24 hr capsule TAKE 1 CAPSULE BY MOUTH TWICE A DAY 10/21/21   Marin Olp, MD  ?Zinc Oxide (DESITIN) 13 % CREA Apply 1 application topically daily as needed (itching).    [provider]  ?                                                                                                                                  ?Past Surgical History ?Past Surgical History:  ?Procedure Laterality Date  ? ABDOMINAL HYSTERECTOMY  1977  ? ovaries remain  ? ANTERIOR LAT LUMBAR FUSION N/A 10/26/2017  ? Procedure: LUMBAR THREE- LUMBAR FOUR, LUMBAR FOUR- LUMBAR FIVE ANTEROLATERAL LUMBAR INTERBODY ARTHRODESIS;  Surgeon: Jovita Gamma, MD;  Location: [place];  Service: Neurosurgery;  Laterality: N/A;  LUMBAR 3- LUMBAR 4, LUMBAR 4- LUMBAR 5 ANTEROLATERAL LUMBAR INTERBODY ARTHRODESIS, LUMBAR 3- LUMBAR 4, LUMBAR 4- LUMBAR 5 PERCUTANEOUS PEDICLE SCREW FIXATION  ? APPENDECTOMY    ? APPLICATION OF WOUND VAC Left 04/27/2021  ? Procedure: APPLICATION OF INCISIONAL WOUND VAC;  Surgeon: Erle Crocker, MD;  Location: [place];  Service: Orthopedics;  Laterality: Left;  ? APPLICATION OF WOUND VAC Left 07/01/2021  ? Procedure: APPLICATION OF WOUND VAC;  Surgeon: Cindra Presume, MD;  Location: [place];  Service: Plastics;  Laterality: Left;  ? BLADDER REPAIR    ? x two  ? CHOLECYSTECTOMY    ? COLONOSCOPY    ? EYE SURGERY  11/14, 12/14  ? Macular Dengeneration, Glaucoma  ? EYE SURGERY  2016  ? left eye  ? I & D EXTREMITY Left 04/27/2021  ? Procedure: IRRIGATION AND DEBRIDEMENT LEFT LEG;  Surgeon: Erle Crocker,  MD;  Location: [place];  Service: Orthopedics;  Laterality: Left;  ? INCISION AND DRAINAGE OF WOUND Right 04/27/2021  ? Procedure: IRRIGATION AND DEBRIDEMENT WOUND;  Surgeon: Erle Crocker, MD;  Location: [place];  Service: Orthopedics;  Laterality: Right;  ? LESION REMOVAL Right 06/28/2013  ? Procedure: EXCISION 3 cm right labial sebaceous cyst;  Surgeon: Lyman Speller, MD;  Location: [facility];  Service: Gynecology;  Laterality: Right;  ? LUMBAR LAMINECTOMY/DECOMPRESSION MICRODISCECTOMY Right 03/20/2016  ? Procedure: Right - Lumbar four-five lumbar laminotomy, foraminotomy, and possible microdiscectomy;  Surgeon: Jovita Gamma, MD;  Location: [place] [facility];  Service: Neurosurgery;  Laterality: Right;  right  ? LUMBAR PERCUTANEOUS PEDICLE SCREW 2 LEVEL  10/26/2017  ? Procedure: LUMBAR THREE- LUMBAR FOUR, LUMBAR FOUR- LUMBAR FIVE PERCUTANEOUS PEDICLE SCREW FIXATION;  Surgeon: Jovita Gamma, MD;  Location: [place];  Service: Neurosurgery;;  ? MOHS SURGERY    ? procedure to remove basal cell  ? SKIN SPLIT GRAFT Left 07/01/2021  ? Procedure: SKIN GRAFT SPLIT THICKNESS;  Surgeon: Cindra Presume, MD;  Location: [place];  Service: Plastics;  Laterality: Left;  1 hour  ? TONSILLECTOMY AND ADENOIDECTOMY    ? URETHRAL SLING  2007  ? URETHRAL SLING  1/12  ? midurethral   ? vertebroplasty secondary to traumatic compression fracture    ? ?Family History ?Family History  ?Problem Relation Age of Onset  ? Congestive Heart Failure Mother   ? Thyroid disease Mother   ? Osteoporosis Mother   ? Alcoholism Mother   ? Prostate cancer Father   ? Breast cancer Maternal Grandmother   ? Hypertension Sister   ? Thyroid disease Sister   ? Cancer Sister   ?     brain ?  ? Rheum arthritis Daughter   ? ? ?Social History ?Social History  ? ?Tobacco Use  ? Smoking status: Former  ?  Types: Cigarettes  ?  Quit date: 08/11/1965  ?  Years since quitting: 56.3  ? Smokeless tobacco: Never  ? Tobacco comments:  ?  has not smoked in 45 years   ?Vaping  Use  ? Vaping Use: Never used  ?Substance Use Topics  ? Alcohol use: Not Currently  ?  Alcohol/week: 1.0 standard drink  ?  Types: 1 Standard drinks or equivalent per week  ?  Comment: occ   ? Drug use: No

## 2021-12-23 ENCOUNTER — Encounter: Payer: Medicare HMO | Admitting: Family Medicine
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
## 2022-01-14 ENCOUNTER — Telehealth: Payer: Medicare HMO

## 2022-01-21 ENCOUNTER — Ambulatory Visit: Payer: Medicare HMO | Admitting: Pharmacist

## 2022-01-21 DIAGNOSIS — E785 Hyperlipidemia, unspecified: Secondary | ICD-10-CM

## 2022-01-21 DIAGNOSIS — I10 Essential (primary) hypertension: Secondary | ICD-10-CM
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
## 2022-01-21 NOTE — Patient Instructions (Addendum)
Visit Information   Goals Addressed             This Visit's Progress    Track and Manage My Blood Pressure-Hypertension   On track    Timeframe:  Long-Range Goal Priority:  High Start Date:  07/15/21                           Expected End Date:  01/13/22                     Follow Up Date 10/13/21    - check blood pressure weekly - choose a place to take my blood pressure (home, clinic or office, retail store) - write blood pressure results in a log or diary    Why is this important?   You won't feel high blood pressure, but it can still hurt your blood vessels.  High blood pressure can cause heart or kidney problems. It can also cause a stroke.  Making lifestyle changes like losing a little weight or eating less salt will help.  Checking your blood pressure at home and at different times of the day can help to control blood pressure.  If the doctor prescribes medicine remember to take it the way the doctor ordered.  Call the office if you cannot afford the medicine or if there are questions about it.     Notes:        Patient Care Plan: CCM Pharmacy Care Plan     Problem Identified: HLD HTN OA Stress incontinence GERD MDD   Priority: High     Long-Range Goal: Disease Management   Start Date: 12/12/2020  Expected End Date: 12/12/2021  Recent Progress: On track  Priority: High  Note:   Current Barriers:  UTI, husband in hospice  Pharmacist Clinical Goal(s):  Patient will contact provider office for questions/concerns as evidenced notation of same in electronic health record through collaboration with PharmD and provider.   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 No recent falls reported by patient.  -Educated on Symptoms of hypotension and importance of maintaining adequate hydration; -Counseled to monitor BP at home, document, and provide log at future appointments -Recommended to continue current medication  Update 07/15/21 Has not been checking at home lately.  Elevated BP last OV, however, her dog was having an operation that caused her to be worried.  Denies any dizziness or HA's.  Has upcoming appointment with wound doc on 12/15.  They will check at this visit.  Have asked she check once weekly or as able to keep an eye on it.  Discussed BP goals. No changes to medication now, continue to monitor.  01/21/22 Fall back in may she tripped while walking her dog.  Denies any concern with blood pressure at this time. She has not really been monitoring at home. Last few office BP's have been normal, pulse has also been normal. Denies any dizziness. Continue current meds, no changes  at this time.  Hyperlipidemia: (LDL goal < 100) -Controlled -On asa daily - denies CAD/Stroke Hx -Current treatment: Vytorin 10-20 mg once daily Appropriate, Effective, Safe, Accessible -Review of side effects - no problems noted -Educated on Cholesterol goals;  -Recommended to continue current medication  Update 07/15/21 Continues adherence with meds.  We do not show updated lipids since 2019.  Recommended patient make appointment for fasting labs and physical with Dr. Yong Channel so we can get these routine labs completed. No changes to meds at this time.  Update 01/21/22 LDL in march well controlled on current  medication. She mentions copay is affordable and no adverse effects. Continue routine screenings, no changes to medication needed at this time.  Osteopenia (Goal minimize symptoms) -Controlled -Last DEXA Scan: 2020 -Patient is not a candidate for pharmacologic treatment -Current treatment  Will start Vitamin D3 1000 units -Medications previously tried:   -Recommend [phone] units of vitamin D daily. Recommend weight-bearing and muscle strengthening exercises for building and maintaining bone density.  MDD (Goal: ensure medication) -Controlled -Feels depression has been well controlled, no side effects. Pain management has been reasonable following spinal surgery 2019, knows not to over do it now. Current treatment  Venlafaxine XR 150 mg twice daily  -Reviewed side effects - no problems noted, sleep has been good as long as trazodone 25 mg is being used -Recommended to continue current medication  Patient Goals/Self-Care Activities Patient will:  - target a minimum of 150 minutes of moderate intensity exercise weekly  Follow Up Plan: [place] f/u telephone visit 6-8 months. 2 months BP CALL CMA Medication Assistance: None required.  Patient affirms current coverage meets needs.            The patient verbalized understanding of instructions, educational materials, and care plan provided today and DECLINED offer to receive copy of patient instructions, educational materials, and care plan.  Telephone follow up appointment with pharmacy team member scheduled for: 6 months  Edythe Clarity, [place], PharmD Clinical Pharmacist  [facility] [phone]

## 2022-01-28 ENCOUNTER — Ambulatory Visit: Payer: Medicare PPO | Admitting: Sports Medicine

## 2022-01-28 NOTE — Progress Notes (Deleted)
    [name] D.[place] Phone: [phone]   Assessment and Plan:     There are no diagnoses linked to this encounter.  ***   Pertinent previous records reviewed include ***   Follow Up: ***     Subjective:   I, [name], am serving as a Education administrator for Doctor Glennon Mac  Chief Complaint: fall   HPI:   01/28/22 Patient is a 86 year old female complaining of   Relevant Historical Information: ***  Additional pertinent review of systems negative.   Current Outpatient Medications:    acetaminophen (TYLENOL) 500 MG tablet, Take 1,000 mg by mouth every 6 (six) hours as needed for moderate pain or headache., Disp: , Rfl:    ALPRAZolam (XANAX) 0.5 MG tablet, TAKE 1/2 TO 1 TABLET(0.25 TO 0.5 MG) BY MOUTH TWICE DAILY AS NEEDED FOR ANXIETY (Patient taking differently: Take 0.5 mg by mouth 2 (two) times daily as needed for anxiety.), Disp: 20 tablet, Rfl: 0   aspirin 81 MG tablet, Take 81 mg by mouth daily., Disp: , Rfl:    brimonidine-timolol (COMBIGAN) 0.2-0.5 % ophthalmic solution, Place 1 drop into both eyes every 12 (twelve) hours., Disp: , Rfl:    clobetasol ointment (TEMOVATE) 0.05 %, apply to affected area twice a day if needed DO NOT USE FOR MORE THAN 5 DAYS, Disp: 30 g, Rfl: 0   ezetimibe-simvastatin (VYTORIN) 10-20 MG tablet, TAKE 1 TABLET BY MOUTH EVERY DAY, Disp: 90 tablet, Rfl: 3   Multiple Vitamins-Minerals (MULTIVITAMIN ADULT) CHEW, Chew 2 each by mouth daily. Nature Made for Her, Disp: , Rfl:    nadolol (CORGARD) 40 MG tablet, TAKE 1 TABLET BY MOUTH AT BEDTIME, Disp: 90 tablet, Rfl: 2   nitrofurantoin, macrocrystal-monohydrate, (MACROBID) 100 MG capsule, Take 1 capsule (100 mg total) by mouth 2 (two) times daily., Disp: 14 capsule, Rfl: 0   omeprazole (PRILOSEC) 40 MG capsule, TAKE 1 CAPSULE(40 MG) BY MOUTH DAILY (Patient taking differently: Take 40 mg by mouth daily.), Disp: 90  capsule, Rfl: 0   ondansetron (ZOFRAN) 4 MG tablet, Take 1 tablet (4 mg total) by mouth every 8 (eight) hours as needed for nausea or vomiting., Disp: 20 tablet, Rfl: 0   sodium chloride (OCEAN) 0.65 % SOLN nasal spray, Place 1 spray into both nostrils 2 (two) times daily as needed for congestion., Disp: , Rfl:    traZODone (DESYREL) 100 MG tablet, TAKE 1/4 TO 1/2 TABLET BY MOUTH AT BEDTIME IF NEEDED FOR SLEEP., Disp: 45 tablet, Rfl: 3   venlafaxine XR (EFFEXOR-XR) 150 MG 24 hr capsule, TAKE 1 CAPSULE BY MOUTH TWICE A DAY, Disp: 180 capsule, Rfl: 3   Zinc Oxide (DESITIN) 13 % CREA, Apply 1 application topically daily as needed (itching)., Disp: , Rfl:    Objective:     There were no vitals filed for this visit.    There is no height or weight on file to calculate BMI.    Physical Exam:    ***   Electronically signed by:  [name] D.[facility] 8:03 AM 01/28/22

## 2022-02-12 ENCOUNTER — Telehealth: Payer: Self-pay | Admitting: Family Medicine

## 2022-02-12 NOTE — Telephone Encounter (Signed)
fyi

## 2022-02-12 NOTE — Telephone Encounter (Signed)
Patient would like a call back from pcp- did not want to share much - stated her husband [name] passed away -

## 2022-02-13 NOTE — Telephone Encounter (Signed)
Spoke to her- she states will schedule visit soon to check in

## 2022-03-10 ENCOUNTER — Emergency Department (HOSPITAL_COMMUNITY)
Admission: EM | Admit: 2022-03-10 | Discharge: 2022-03-10 | Disposition: A | Discharge status: Home / Self Care | Payer: Medicare PPO | Attending: Emergency Medicine | Admitting: Emergency Medicine

## 2022-03-10 ENCOUNTER — Other Ambulatory Visit: Payer: Self-pay

## 2022-03-10 ENCOUNTER — Encounter: Payer: Self-pay | Admitting: Family Medicine

## 2022-03-10 ENCOUNTER — Emergency Department (HOSPITAL_COMMUNITY): Payer: Medicare PPO

## 2022-03-10 ENCOUNTER — Encounter (HOSPITAL_COMMUNITY): Payer: Self-pay | Admitting: *Deleted

## 2022-03-10 ENCOUNTER — Ambulatory Visit (INDEPENDENT_AMBULATORY_CARE_PROVIDER_SITE_OTHER): Payer: Medicare PPO | Admitting: Family Medicine

## 2022-03-10 ENCOUNTER — Telehealth: Payer: Self-pay | Admitting: Family Medicine

## 2022-03-10 ENCOUNTER — Ambulatory Visit: Payer: Medicare PPO | Admitting: Family

## 2022-03-10 VITALS — BP 110/70 | HR 62 | Ht 60.0 in | Wt 147.0 lb

## 2022-03-10 DIAGNOSIS — R531 Weakness: Secondary | ICD-10-CM | POA: Diagnosis not present

## 2022-03-10 DIAGNOSIS — S0990XA Unspecified injury of head, initial encounter: Secondary | ICD-10-CM | POA: Diagnosis not present

## 2022-03-10 DIAGNOSIS — Y92531 Health care provider office as the place of occurrence of the external cause: Secondary | ICD-10-CM | POA: Diagnosis not present

## 2022-03-10 DIAGNOSIS — I10 Essential (primary) hypertension: Secondary | ICD-10-CM | POA: Diagnosis not present

## 2022-03-10 DIAGNOSIS — S50312A Abrasion of left elbow, initial encounter: Secondary | ICD-10-CM

## 2022-03-10 DIAGNOSIS — S7002XA Contusion of left hip, initial encounter: Secondary | ICD-10-CM | POA: Diagnosis not present

## 2022-03-10 DIAGNOSIS — W19XXXA Unspecified fall, initial encounter: Secondary | ICD-10-CM | POA: Diagnosis not present

## 2022-03-10 DIAGNOSIS — R300 Dysuria: Secondary | ICD-10-CM

## 2022-03-10 DIAGNOSIS — M7989 Other specified soft tissue disorders: Secondary | ICD-10-CM | POA: Diagnosis not present

## 2022-03-10 DIAGNOSIS — W01198A Fall on same level from slipping, tripping and stumbling with subsequent striking against other object, initial encounter: Secondary | ICD-10-CM | POA: Insufficient documentation

## 2022-03-10 DIAGNOSIS — Z7982 Long term (current) use of aspirin: Secondary | ICD-10-CM | POA: Insufficient documentation

## 2022-03-10 DIAGNOSIS — R110 Nausea: Secondary | ICD-10-CM

## 2022-03-10 DIAGNOSIS — I959 Hypotension, unspecified: Secondary | ICD-10-CM | POA: Insufficient documentation

## 2022-03-10 DIAGNOSIS — S060XAA Concussion with loss of consciousness status unknown, initial encounter: Secondary | ICD-10-CM | POA: Diagnosis not present

## 2022-03-10 DIAGNOSIS — R61 Generalized hyperhidrosis: Secondary | ICD-10-CM | POA: Insufficient documentation

## 2022-03-10 DIAGNOSIS — R8289 Other abnormal findings on cytological and histological examination of urine: Secondary | ICD-10-CM | POA: Insufficient documentation

## 2022-03-10 DIAGNOSIS — Z043 Encounter for examination and observation following other accident: Secondary | ICD-10-CM | POA: Diagnosis not present

## 2022-03-10 DIAGNOSIS — M4312 Spondylolisthesis, cervical region: Secondary | ICD-10-CM | POA: Diagnosis not present

## 2022-03-10 DIAGNOSIS — M25552 Pain in left hip: Secondary | ICD-10-CM | POA: Diagnosis not present

## 2022-03-10 DIAGNOSIS — Z981 Arthrodesis status: Secondary | ICD-10-CM | POA: Diagnosis not present

## 2022-03-10 DIAGNOSIS — R0602 Shortness of breath: Secondary | ICD-10-CM | POA: Diagnosis not present

## 2022-03-10 DIAGNOSIS — I739 Peripheral vascular disease, unspecified: Secondary | ICD-10-CM | POA: Diagnosis not present

## 2022-03-10 DIAGNOSIS — S060X0A Concussion without loss of consciousness, initial encounter: Secondary | ICD-10-CM | POA: Diagnosis not present

## 2022-03-10 LAB — COMPREHENSIVE METABOLIC PANEL
ALT: 12 U/L (ref 0–44)
AST: 20 U/L (ref 15–41)
Albumin: 3.3 g/dL — ABNORMAL LOW (ref 3.5–5.0)
Alkaline Phosphatase: 57 U/L (ref 38–126)
Anion gap: 7 (ref 5–15)
BUN: 13 mg/dL (ref 8–23)
CO2: 24 mmol/L (ref 22–32)
Calcium: 8.8 mg/dL — ABNORMAL LOW (ref 8.9–10.3)
Chloride: 109 mmol/L (ref 98–111)
Creatinine, Ser: 0.95 mg/dL (ref 0.44–1.00)
GFR, Estimated: 59 mL/min — ABNORMAL LOW (ref >60)
Glucose, Bld: 131 mg/dL — ABNORMAL HIGH (ref 70–99)
Potassium: 4.2 mmol/L (ref 3.5–5.1)
Sodium: 140 mmol/L (ref 135–145)
Total Bilirubin: 0.5 mg/dL (ref 0.3–1.2)
Total Protein: 5.9 g/dL — ABNORMAL LOW (ref 6.5–8.1)

## 2022-03-10 LAB — URINALYSIS, ROUTINE W REFLEX MICROSCOPIC
Bilirubin Urine: NEGATIVE
Glucose, UA: NEGATIVE mg/dL
Hgb urine dipstick: NEGATIVE
Ketones, ur: NEGATIVE mg/dL
Nitrite: NEGATIVE
Protein, ur: NEGATIVE mg/dL
Specific Gravity, Urine: 1.018 (ref 1.005–1.030)
WBC, UA: >50 WBC/hpf — ABNORMAL HIGH (ref 0–5)
pH: 5 (ref 5.0–8.0)

## 2022-03-10 LAB — CBC WITH DIFFERENTIAL/PLATELET
Abs Immature Granulocytes: 0.02 10*3/uL (ref 0.00–0.07)
Basophils Absolute: 0 10*3/uL (ref 0.0–0.1)
Basophils Relative: 1 %
Eosinophils Absolute: 0 10*3/uL (ref 0.0–0.5)
Eosinophils Relative: 0 %
HCT: 38.5 % (ref 36.0–46.0)
Hemoglobin: 12.2 g/dL (ref 12.0–15.0)
Immature Granulocytes: 0 %
Lymphocytes Relative: 22 %
Lymphs Abs: 1.7 10*3/uL (ref 0.7–4.0)
MCH: 31.9 pg (ref 26.0–34.0)
MCHC: 31.7 g/dL (ref 30.0–36.0)
MCV: 100.8 fL — ABNORMAL HIGH (ref 80.0–100.0)
Monocytes Absolute: 0.5 10*3/uL (ref 0.1–1.0)
Monocytes Relative: 6 %
Neutro Abs: 5.7 10*3/uL (ref 1.7–7.7)
Neutrophils Relative %: 71 %
Platelets: 208 10*3/uL (ref 150–400)
RBC: 3.82 MIL/uL — ABNORMAL LOW (ref 3.87–5.11)
RDW: 13.2 % (ref 11.5–15.5)
WBC: 8 10*3/uL (ref 4.0–10.5)
nRBC: 0 % (ref 0.0–0.2)

## 2022-03-10 LAB — LIPASE, BLOOD: Lipase: 28 U/L (ref 11–51)

## 2022-03-10 MED ORDER — CEPHALEXIN 500 MG PO CAPS: 500.0000 mg | ORAL_CAPSULE | Freq: Three times a day (TID) | ORAL | 0 refills | Status: AC

## 2022-03-10 MED ORDER — ACETAMINOPHEN 500 MG PO TABS
1000.0000 mg | ORAL_TABLET | Freq: Once | ORAL | Status: AC
  Administered 2022-03-10: 1000 mg via ORAL
  Filled 2022-03-10: qty 2

## 2022-03-10 MED ORDER — LIDOCAINE 5 % EX PTCH
1.0000 | MEDICATED_PATCH | Freq: Once | CUTANEOUS | Status: DC
  Administered 2022-03-10: 1 via TRANSDERMAL
  Filled 2022-03-10: qty 1

## 2022-03-10 MED ORDER — ONDANSETRON HCL 4 MG/2ML IJ SOLN
4.0000 mg | Freq: Once | INTRAMUSCULAR | Status: AC
  Administered 2022-03-10: 4 mg via INTRAVENOUS
  Filled 2022-03-10: qty 2

## 2022-03-10 MED ORDER — ONDANSETRON HCL 4 MG PO TABS: 4.0000 mg | ORAL_TABLET | Freq: Four times a day (QID) | ORAL | 0 refills | Status: DC

## 2022-03-10 NOTE — ED Notes (Signed)
Family at bedside. 

## 2022-03-10 NOTE — Discharge Instructions (Addendum)
Your scans and lab work were overall reassuring today.  Your CT scan did not show any bleeding or skull fracture but I do suspect that you have a concussion.  Make sure you are getting plenty of rest over the next few days and avoid lots of stimulation or physical activity.  You want to avoid anything that might lead to a second injury to your head.  You can use prescribed Zofran as needed for nausea.  You have a hematoma over the left hip but no fracture.  You can use ice, Tylenol or Lidocaine patches to help with this.  Follow-up with your primary care doctor and take urine sample and for analysis there since you are unable to provide a urine sample today.

## 2022-03-10 NOTE — ED Provider Notes (Signed)
[facility] EMERGENCY DEPARTMENT Provider Note   CSN: [id number] Arrival date & time: 03/10/22  1108     History  Chief Complaint  Patient presents with   Fall    [name] is a 86 y.o. female.  [name] is a 86 y.o. female who presents to the ED with concern of left hip pain, nausea, and light headedness after falling and hitting her head at the doctors office this morning.  Patient reports that her primary care provider checked her out after the fall and then they completed her doctor's visit.  She was going to drop off a urine sample for possible UTI but was not able to provide a sample at the office.  She left the office but was given return precautions and went to run errands with a friend, and then went to breakfast.  She reports while at the restaurant she started having headache and feeling very nauseated and dizzy.  Then had an episode where her friend thinks she briefly passed out.  EMS noted that upon arrival patient was hypotensive and diaphoretic.  No witnessed seizure activity or postictal phase.  Patient also reports that she is having left hip pain.  She is unsure if she injured this hip during the fall.  She reports that she thinks she was having some mild hip pain this morning even before the fall but it certainly worse now.  She does not think that she has walked or put weight on it since then.  Patient not on any meds.  She has a small bandage to the right elbow but denies any pain in this area denies any other injuries from the fall.  No chest pain, shortness of breath or abdominal pain.  No other injuries to the extremities.  No neck or back pain.  Patient typically walks with a walker at home but did not have her walker at her appointment today which she fell.  The history is provided by the patient.  Fall Associated symptoms include headaches. Pertinent negatives include no chest pain, no abdominal pain and no shortness of breath.        Home Medications Prior to Admission medications   Medication Sig Start Date End Date Taking? Authorizing Provider  cephALEXin (KEFLEX) 500 MG capsule Take 1 capsule (500 mg total) by mouth 3 (three) times daily for 7 days. 03/10/22 03/17/22 Yes Jacqlyn Larsen, PA-C  ondansetron (ZOFRAN) 4 MG tablet Take 1 tablet (4 mg total) by mouth every 6 (six) hours. 03/10/22  Yes Jacqlyn Larsen, PA-C  acetaminophen (TYLENOL) 500 MG tablet Take 1,000 mg by mouth every 6 (six) hours as needed for moderate pain or headache.    [provider]  ALPRAZolam (XANAX) 0.5 MG tablet TAKE 1/2 TO 1 TABLET(0.25 TO 0.5 MG) BY MOUTH TWICE DAILY AS NEEDED FOR ANXIETY Patient taking differently: Take 0.5 mg by mouth 2 (two) times daily as needed for anxiety. 03/25/21   Marin Olp, MD  aspirin 81 MG tablet Take 81 mg by mouth daily.    [provider]  brimonidine-timolol (COMBIGAN) 0.2-0.5 % ophthalmic solution Place 1 drop into both eyes every 12 (twelve) hours.    [provider]  clobetasol ointment (TEMOVATE) 0.05 % apply to affected area twice a day if needed DO NOT USE FOR MORE THAN 5 DAYS 10/15/20   Marin Olp, MD  ezetimibe-simvastatin (VYTORIN) 10-20 MG tablet TAKE 1 TABLET BY MOUTH EVERY DAY 05/09/21  Marin Olp, MD  Multiple Vitamins-Minerals (MULTIVITAMIN ADULT) CHEW Chew 2 each by mouth daily. Nature Made for Her    [provider]  nadolol (CORGARD) 40 MG tablet TAKE 1 TABLET BY MOUTH AT BEDTIME 10/21/21   Marin Olp, MD  nitrofurantoin, macrocrystal-monohydrate, (MACROBID) 100 MG capsule Take 1 capsule (100 mg total) by mouth 2 (two) times daily. 06/25/21   Vivi Barrack, MD  omeprazole (PRILOSEC) 40 MG capsule TAKE 1 CAPSULE(40 MG) BY MOUTH DAILY Patient taking differently: Take 40 mg by mouth daily. 05/30/19   Marin Olp, MD  sodium chloride (OCEAN) 0.65 % SOLN nasal spray Place 1 spray into both nostrils 2 (two) times daily as needed for  congestion.    [provider]  traZODone (DESYREL) 100 MG tablet TAKE 1/4 TO 1/2 TABLET BY MOUTH AT BEDTIME IF NEEDED FOR SLEEP. 07/15/21   Marin Olp, MD  venlafaxine XR (EFFEXOR-XR) 150 MG 24 hr capsule TAKE 1 CAPSULE BY MOUTH TWICE A DAY 10/21/21   Marin Olp, MD  Zinc Oxide (DESITIN) 13 % CREA Apply 1 application topically daily as needed (itching).    [provider]      Allergies    Citrate of magnesia, Codeine, and Shingrix [zoster vac recomb adjuvanted]    Review of Systems   Review of Systems  Constitutional:  Negative for chills and fever.  HENT: Negative.    Eyes:  Negative for visual disturbance.  Respiratory:  Negative for cough and shortness of breath.   Cardiovascular:  Negative for chest pain.  Gastrointestinal:  Positive for nausea and vomiting. Negative for abdominal pain.  Genitourinary:  Positive for dysuria and frequency.  Musculoskeletal:  Positive for arthralgias. Negative for back pain and neck pain.  Neurological:  Positive for dizziness and headaches. Negative for numbness.  All other systems reviewed and are negative.   Physical Exam Updated Vital Signs BP (!) 105/56   Pulse 66   Temp (!) 97.5 F (36.4 C) (Oral)   Resp 20   Ht 5' (1.524 m)   Wt 65.3 kg   LMP 08/11/1974   SpO2 98%   BMI 28.12 kg/m  Physical Exam Vitals and nursing note reviewed.  Constitutional:      General: She is not in acute distress.    Appearance: Normal appearance. She is well-developed. She is not diaphoretic.  HENT:     Head: Normocephalic.     Comments: Tenderness over the posterior scalp without palpable hematoma, step-off or deformity, no lacerations, negative battle sign    Nose: Nose normal.     Mouth/Throat:     Mouth: Mucous membranes are moist.     Pharynx: Oropharynx is clear.  Eyes:     General:        Right eye: No discharge.        Left eye: No discharge.     Extraocular Movements: Extraocular movements intact.      Pupils: Pupils are equal, round, and reactive to light.  Neck:     Comments: No midline C-spine tenderness Cardiovascular:     Rate and Rhythm: Normal rate and regular rhythm.     Pulses: Normal pulses.     Heart sounds: Normal heart sounds.  Pulmonary:     Effort: Pulmonary effort is normal. No respiratory distress.     Breath sounds: Normal breath sounds. No wheezing or rales.     Comments: Respirations equal and unlabored, patient able to speak in full sentences,  lungs clear to auscultation bilaterally, no chest wall tenderness, no overlying ecchymosis or deformity. Abdominal:     General: Bowel sounds are normal. There is no distension.     Palpations: Abdomen is soft. There is no mass.     Tenderness: There is no abdominal tenderness. There is no guarding.     Comments: Abdomen soft, nondistended, nontender to palpation in all quadrants without guarding or peritoneal signs  Musculoskeletal:        General: Tenderness present. No deformity.     Cervical back: Neck supple. No tenderness.     Comments: No midline spinal tenderness.  Tenderness over the lateral portion of the left hip without overlying ecchymosis or skin changes, pain when lifting the leg against resistance, no rotation or shortening.  All other joints supple and easily movable, all compartments soft  Skin:    General: Skin is warm and dry.     Capillary Refill: Capillary refill takes less than 2 seconds.  Neurological:     Mental Status: She is alert and oriented to person, place, and time.     Coordination: Coordination normal.     Comments: Speech is clear, able to follow commands CN III-XII intact Normal strength in upper and lower extremities bilaterally including dorsiflexion and plantar flexion, strong and equal grip strength Sensation normal to light and sharp touch Moves extremities without ataxia, coordination intact  Psychiatric:        Mood and Affect: Mood normal.        Behavior: Behavior normal.      ED Results / Procedures / Treatments   Labs (all labs ordered are listed, but only abnormal results are displayed) Labs Reviewed  URINALYSIS, ROUTINE W REFLEX MICROSCOPIC - Abnormal; Notable for the following components:      Result Value   APPearance HAZY (*)    Leukocytes,Ua MODERATE (*)    WBC, UA >50 (*)    Bacteria, UA FEW (*)    All other components within normal limits  COMPREHENSIVE METABOLIC PANEL - Abnormal; Notable for the following components:   Glucose, Bld 131 (*)    Calcium 8.8 (*)    Total Protein 5.9 (*)    Albumin 3.3 (*)    GFR, Estimated 59 (*)    All other components within normal limits  CBC WITH DIFFERENTIAL/PLATELET - Abnormal; Notable for the following components:   RBC 3.82 (*)    MCV 100.8 (*)    All other components within normal limits  URINE CULTURE  LIPASE, BLOOD    EKG EKG Interpretation  Date/Time:  Monday March 10 2022 11:20:15 EDT Ventricular Rate:  57 PR Interval:  187 QRS Duration: 134 QT Interval:  482 QTC Calculation: 470 R Axis:   -53 Text Interpretation: Sinus rhythm Left bundle branch block No significant change since last tracing Confirmed by Blanchie Dessert [id number]) on 03/10/2022 12:07:06 PM  Radiology CT Hip Left Wo Contrast  Result Date: 03/10/2022 CLINICAL DATA:  Hip trauma. Fracture suspected. Fell and hit left hip this morning. Left hip pain. EXAM: CT OF THE LEFT HIP WITHOUT CONTRAST TECHNIQUE: Multidetector CT imaging of the left hip was performed according to the standard protocol. Multiplanar CT image reconstructions were also generated. RADIATION DOSE REDUCTION: This exam was performed according to the departmental dose-optimization program which includes automated exposure control, adjustment of the mA and/or kV according to patient size and/or use of iterative reconstruction technique. COMPARISON:  Pelvis and left hip radiographs 03/10/2022 AP pelvis 04/27/2021 and 12/17/2021; CT  abdomen and pelvis FINDINGS:  Bones/Joint/Cartilage No acute fracture is seen. A small corticated ossicle at the anterior superior left femoroacetabular joint is unchanged and chronic (coronal series 8, image 44 and sagittal series 9, image 65). Mild-to-moderate pubic symphysis joint space narrowing, subchondral sclerosis, and peripheral osteophytosis. There is again patchy sclerosis within the left femoral head as previously seen within the bilateral femoral heads on 10/31/2017 CT. This is nonspecific but may be secondary to early avascular necrosis. Moderate left femoroacetabular joint space narrowing with moderate anterior superior left acetabular subchondral degenerative cystic change. Ligaments Suboptimally assessed by CT. Muscles and Tendons Normal muscle density and size. Soft tissues There is likely dense fluid collection suggesting a hematoma within the subcutaneous fat lateral to the left hip measuring up to approximately 8.5 x 8.0 x 15 cm (transverse by AP by craniocaudal). There is moderate surrounding soft tissue swelling and subcutaneous fat stranding. No gross abnormality is seen within the intrapelvic soft tissues. IMPRESSION: 1. There is a moderate-to-large hematoma within the subcutaneous fat lateral to the left hip. 2. No acute fracture is seen. 3. Moderate left femoroacetabular osteoarthritis. Electronically Signed   By: Yvonne Kendall M.D.   On: 03/10/2022 13:51   CT Cervical Spine Wo Contrast  Result Date: 03/10/2022 CLINICAL DATA:  Neck trauma.  Fall. EXAM: CT CERVICAL SPINE WITHOUT CONTRAST TECHNIQUE: Multidetector CT imaging of the cervical spine was performed without intravenous contrast. Multiplanar CT image reconstructions were also generated. RADIATION DOSE REDUCTION: This exam was performed according to the departmental dose-optimization program which includes automated exposure control, adjustment of the mA and/or kV according to patient size and/or use of iterative reconstruction technique. COMPARISON:   Cervical spine CT 12/17/2021 FINDINGS: Alignment: Unchanged trace retrolisthesis of C3 on C4 and trace anterolisthesis of C4 on C5. Skull base and vertebrae: No acute fracture or suspicious osseous lesion. Soft tissues and spinal canal: No prevertebral fluid or swelling. No visible canal hematoma. Disc levels: Unchanged moderate cervical disc and facet degeneration without evidence of high-grade stenosis. Upper chest: Biapical pleuroparenchymal lung scarring. Other: None. IMPRESSION: No acute cervical spine fracture. Electronically Signed   By: Logan Bores M.D.   On: 03/10/2022 13:34   CT Head Wo Contrast  Result Date: 03/10/2022 CLINICAL DATA:  Trauma, fall EXAM: CT HEAD WITHOUT CONTRAST TECHNIQUE: Contiguous axial images were obtained from the base of the skull through the vertex without intravenous contrast. RADIATION DOSE REDUCTION: This exam was performed according to the departmental dose-optimization program which includes automated exposure control, adjustment of the mA and/or kV according to patient size and/or use of iterative reconstruction technique. COMPARISON:  12/17/2021 FINDINGS: Brain: No acute intracranial findings are seen in noncontrast CT brain. There are no signs of bleeding within the cranium. There is no focal mass effect. Cortical sulci are prominent. There is decreased density in periventricular white matter. Vascular: Unremarkable. Skull: Unremarkable. Sinuses/Orbits: Unremarkable. Other: None. IMPRESSION: No acute intracranial finding is seen in noncontrast CT brain. Atrophy. Small vessel disease. Electronically Signed   By: Elmer Picker M.D.   On: 03/10/2022 13:31   DG Chest 1 View  Result Date: 03/10/2022 CLINICAL DATA:  Shortness of breath, fall EXAM: CHEST  1 VIEW COMPARISON:  12/17/2021 FINDINGS: Cardiac and mediastinal contours are within normal limits given low lung volumes and AP technique. Redemonstrated elevation of the right hemidiaphragm. No focal pulmonary  opacity. No pleural effusion or pneumothorax. No acute osseous abnormality. IMPRESSION: No acute cardiopulmonary process. Electronically Signed   By: Francetta Found.D.  On: 03/10/2022 12:39   DG Hip Unilat With Pelvis 2-3 Views Left  Result Date: 03/10/2022 CLINICAL DATA:  Fall. EXAM: DG HIP (WITH OR WITHOUT PELVIS) 2-3V LEFT COMPARISON:  Pelvic radiograph 12/17/2021 FINDINGS: No acute fracture or hip dislocation is identified. There is unchanged mild left greater than right hip joint space narrowing. Prior lumbar spine fusion is noted. IMPRESSION: No acute osseous abnormality identified. Electronically Signed   By: Logan Bores M.D.   On: 03/10/2022 12:38    Procedures Procedures    Medications Ordered in ED Medications  lidocaine (LIDODERM) 5 % 1 patch (1 patch Transdermal Patch Applied 03/10/22 1547)  ondansetron (ZOFRAN) injection 4 mg (4 mg Intravenous Given 03/10/22 1234)  acetaminophen (TYLENOL) tablet 1,000 mg (1,000 mg Oral Given 03/10/22 1546)    ED Course/ Medical Decision Making/ A&P                           Medical Decision Making Amount and/or Complexity of Data Reviewed Labs: ordered. Radiology: ordered.  Risk OTC drugs. Prescription drug management.   86 y.o. female presents to the ED with complaints of fall, head injury, nausea, this involves an extensive number of treatment options, and is a complaint that carries with it a high risk of complications and morbidity.  The differential diagnosis includes concussion, head bleed, skull fracture  On arrival pt is nontoxic, vitals WNL. Exam significant for no focal neurologic deficits, patient nauseated and intermittently dry heaving.  Tenderness over the left hip without obvious deformity  Additional history obtained from friend at bedside. Previous records obtained and reviewed including notes from PCP visit today  I ordered medication including Zofran for nausea  Lab Tests:  I Ordered, reviewed, and interpreted  labs, which included: No leukocytosis and stable hemoglobin, no significant electrolyte derangements, normal renal and liver function, normal lipase.  UA with moderate leuks, > 50 WBCs and few bacteria present, will send for culture, given symptoms we will treat with Keflex  Imaging Studies ordered:  I ordered imaging studies which included CT of the head and cervical spine as well as x-rays of the chest and left hip, I independently visualized and interpreted imaging which showed no rib fractures or pneumothorax, chest x-ray without pneumonia or pulmonary edema, hip x-rays without obvious fracture, but given pain will send for CT.  CTs of the head and cervical spine without evidence of fracture, no intracranial bleeding.  CT of the left hip without fracture or dislocation but patient does have hematoma  ED Course:   Evaluation has been reassuring.  Suspect patient has mild concussion.  Nausea resolved with Zofran and patient tolerating p.o.  Discussed overall reassuring imaging results and discussed hematoma over the hip which is likely the source of her pain but fortunately no evidence of fracture.  Patient ambulated with assistance here in the department, typically uses a walker at home.  Patient was eventually able to give a urine sample which does show some signs of infection and given her symptoms we will send for culture and treat with Keflex per PCPs plan.  Provided patient with reassurance and discussed supportive care and return precautions.  Discharged home in good condition    Portions of this note were generated with [employer]. Dictation errors may occur despite best attempts at proofreading.         Final Clinical Impression(s) / ED Diagnoses Final diagnoses:  Fall, initial encounter  Concussion with unknown loss of  consciousness status, initial encounter  Nausea  Hematoma of left hip, initial encounter    Rx / DC Orders ED Discharge Orders           Ordered    ondansetron (ZOFRAN) 4 MG tablet  Every 6 hours        03/10/22 1536    cephALEXin (KEFLEX) 500 MG capsule  3 times daily        03/10/22 1605              Janet Berlin 03/10/22 1734    Blanchie Dessert, MD 03/11/22 [phone]

## 2022-03-10 NOTE — ED Notes (Signed)
Walked patient to the bathroom patient did well with my assist patient is now back in bed on the monitor

## 2022-03-10 NOTE — Telephone Encounter (Signed)
Noted - team please follow up on ED disposition- see if urine culture was collected and if not- have her back for this

## 2022-03-10 NOTE — Telephone Encounter (Signed)
Caller states that following OV with PCP on 07/31, patient had an accident at a restaurant where she loss consciousness. States patient is currently in the ER and wanted to let it be known that the urine culture will be completed there. Caller states ED insisted on completing this there. States she was informed patient has a hematoma and may have a concussion. Caller requests a call back at [phone] if more questions arise.

## 2022-03-10 NOTE — Progress Notes (Signed)
Phone [phone] In person visit   Subjective:   [name] is a 86 y.o. year old very pleasant female patient who presents for/with See problem oriented charting Chief Complaint  Patient presents with   Fall    Pt was here to drop off urine sample and tripped over the curb and fell, scrapping her elbow, right middle finger, left knee and foot. Pt declines pain at this time.   Past Medical History-  Patient Active Problem List   Diagnosis Date Noted   Liver lesion 11/10/2017    Priority: High   Memory changes 03/06/2017    Priority: Medium    Lumbar stenosis with neurogenic claudication 03/20/2016    Priority: Medium    Scalp pain 10/22/2015    Priority: Medium    Macular degeneration of right eye 03/03/2013    Priority: Medium    Glaucoma 03/03/2013    Priority: Medium    Hyperlipidemia 03/14/2009    Priority: Medium    Depression 10/19/2007    Priority: Medium    Essential hypertension 10/19/2007    Priority: Medium    GASTROESOPHAGEAL REFLUX DISEASE 10/19/2007    Priority: Medium    Chest pain 01/24/2018    Priority: Low   Post-traumatic headache 10/11/2015    Priority: Low   Routine health maintenance 03/06/2013    Priority: Low   Osteoarthritis 10/19/2007    Priority: Low   STRESS INCONTINENCE 10/19/2007    Priority: Low   Allergic rhinitis 10/19/2007    Priority: Low   Major depressive disorder with single episode, in full remission ([place]) 10/22/2021   Aortic atherosclerosis ([place]) 01/17/2021   Osteopenia of neck of right femur 03/29/2019   Nausea & vomiting 10/31/2017   Chronic daily headache 07/08/2017    Medications- reviewed and updated Current Outpatient Medications  Medication Sig Dispense Refill   acetaminophen (TYLENOL) 500 MG tablet Take 1,000 mg by mouth every 6 (six) hours as needed for moderate pain or headache.     ALPRAZolam (XANAX) 0.5 MG tablet TAKE 1/2 TO 1 TABLET(0.25 TO 0.5 MG) BY MOUTH TWICE DAILY AS NEEDED FOR ANXIETY  (Patient taking differently: Take 0.5 mg by mouth 2 (two) times daily as needed for anxiety.) 20 tablet 0   aspirin 81 MG tablet Take 81 mg by mouth daily.     brimonidine-timolol (COMBIGAN) 0.2-0.5 % ophthalmic solution Place 1 drop into both eyes every 12 (twelve) hours.     clobetasol ointment (TEMOVATE) 0.05 % apply to affected area twice a day if needed DO NOT USE FOR MORE THAN 5 DAYS 30 g 0   ezetimibe-simvastatin (VYTORIN) 10-20 MG tablet TAKE 1 TABLET BY MOUTH EVERY DAY 90 tablet 3   Multiple Vitamins-Minerals (MULTIVITAMIN ADULT) CHEW Chew 2 each by mouth daily. Nature Made for Her     nadolol (CORGARD) 40 MG tablet TAKE 1 TABLET BY MOUTH AT BEDTIME 90 tablet 2   nitrofurantoin, macrocrystal-monohydrate, (MACROBID) 100 MG capsule Take 1 capsule (100 mg total) by mouth 2 (two) times daily. 14 capsule 0   omeprazole (PRILOSEC) 40 MG capsule TAKE 1 CAPSULE(40 MG) BY MOUTH DAILY (Patient taking differently: Take 40 mg by mouth daily.) 90 capsule 0   ondansetron (ZOFRAN) 4 MG tablet Take 1 tablet (4 mg total) by mouth every 8 (eight) hours as needed for nausea or vomiting. 20 tablet 0   sodium chloride (OCEAN) 0.65 % SOLN nasal spray Place 1 spray into both nostrils 2 (two) times daily as needed for congestion.  traZODone (DESYREL) 100 MG tablet TAKE 1/4 TO 1/2 TABLET BY MOUTH AT BEDTIME IF NEEDED FOR SLEEP. 45 tablet 3   venlafaxine XR (EFFEXOR-XR) 150 MG 24 hr capsule TAKE 1 CAPSULE BY MOUTH TWICE A DAY 180 capsule 3   Zinc Oxide (DESITIN) 13 % CREA Apply 1 application topically daily as needed (itching).     No current facility-administered medications for this visit.     Objective:  BP 110/70   Pulse 62   Ht 5' (1.524 m)   Wt 147 lb (66.7 kg)   LMP 08/11/1974   SpO2 99%   BMI 28.71 kg/m  Gen: NAD, resting comfortably CV: RRR no murmurs rubs or gallops Lungs: CTAB no crackles, wheeze, rhonchi Ext: no edema Skin: warm, dry, 2 separate small abrasions on left elbow-able to  cover with 1 larger bandage after irrigation MSK: No pain with palpation around knee or left elbow outside of the abrasions themselves Neuro: grossly normal, walks with assist a friend    Assessment and Plan    # mechanical fall S: was wlaking into clinic to "drop off a urine". No documentation that I can see about this in the chart where I had okayed this. I last spoke to her on 02/12/22 after passing of her husband.   When she went to step up on the curb apparently missed a step- initially fell forwars onto her knees then backwards onto her left elbow- she then proceeded to lightly hit her head but she denies any headache or head pain- states most of force was first falling onto knees and then onto her elbow.  No chest pain or shortness of breath reported.  No dizziness reported.  Does not report any pain in other areas or abrasions-I did note a slight abrasion on her left knee  Usually uses walker but was hard to get in friends car so didn't bring and didn't use cane  A/P: Mechanical fall today while reportedly coming into clinic to drop off urine (once again this was not previously discussed) when she had a mechanical fall on the curb-we irrigated and then dressed the left elbow with a topical antibacterial and bandage-she is up-to-date on her Tdap.  She denies headache, blurry vision, generalized weakness or weakness that is unilateral.  We had worked patient in after her fall to make sure she was okay In the recommended he come back in for evaluation after I saw medics patient but she had an appointment scheduled and stated she had to leave-she agrees to follow-up/seek care if she has recurrent falls, develops headache or blurry vision -I encouraged her to always use a walker and add an absolute minimum a cane to help avoid similar issues  #Concern for UTI S: Apparently patient was coming in today to "drop off a urine as above"  Patients symptoms started about 3 months ago- getting worse.   Complains of dysuria: yes; polyuria: no; nocturia: yes; urgency: states no. Worsening incontinence Symptoms are worsening. Stable flank pain but history back surgery and stable ROS- no fever, chills, nausea, vomiting, flank pain. No blood in urine.  A/P: UA will be obtained. Likely UTI. Will get culture. Empiric treatment with: Keflex likely 500 mg 3 times a day but we discussed needing to collect the urine before starting antibiotics Patient to follow up if new or worsening symptoms or failure to improve.    Recommended follow up: Return in about 4 months (around 07/10/2022) for followup or sooner if needed.Schedule b4 you leave.  Future Appointments  Date Time Provider [place]  06/24/2022  9:20 AM Marin Olp, MD [facility]    Lab/Order associations:   ICD-10-CM   1. Dysuria  R30.0 POCT Urinalysis Dipstick (Automated)    Urine Culture    2. Fall, initial encounter  W19.XXXA       Time Spent: 30 minutes of total time (8:34 PM-8:54 PM 12:47 PM- 12:57 PM) was spent on the date of the encounter performing the following actions: chart review prior to seeing the patient, obtaining history, performing a medically necessary exam, counseling on the treatment plan as well as reinforcing importance of using assistive device, placing orders, and documenting in our EHR.    Return precautions advised.  Garret Reddish, MD

## 2022-03-10 NOTE — ED Triage Notes (Signed)
Patient went to her PCP this am to give them a urine spec. States she was getting out of her car and missed the step and fell hitting her head and her left hip. States she went into her PCP office and was exam and sent home, went to eat with her friends and became very diaphoretic and nauseated. Upon ems arrival patient was hypotensive and diaphoretic. Upon arrival to ed alert oriented skin w/d. C/o left hip pain

## 2022-03-10 NOTE — ED Notes (Signed)
Patient verbalizes understanding of discharge instructions. Opportunity for questioning and answers were provided. Armband removed by staff, pt discharged from ED.  

## 2022-03-10 NOTE — ED Notes (Signed)
Pt drank water with no problems. RN aware.

## 2022-03-10 NOTE — Patient Instructions (Addendum)
If cant urinate today- lets give her a cup to bring back for urinalysis and urine culture- I need the urine submitted before we send in antibiotics- will plan on keflex 3x a day for 7 days once we have the urine  If you develop headaches, blurry vision, weakness in extremeties seek care with the fall  Change dressing if soaks through and otherwise every 24 hours  Recommended follow up: Return in about 4 months (around 07/10/2022) for followup or sooner if needed.Schedule b4 you leave.

## 2022-03-10 NOTE — Telephone Encounter (Signed)
FYI

## 2022-03-10 NOTE — ED Notes (Signed)
Tech ambulated to  bathroom with 1 assist

## 2022-03-11 NOTE — Telephone Encounter (Signed)
Based on the chart the culture was collected.

## 2022-03-12 ENCOUNTER — Telehealth: Payer: Self-pay | Admitting: *Deleted

## 2022-03-12 LAB — URINE CULTURE: Culture: >=100000 — AB

## 2022-03-12 NOTE — Telephone Encounter (Signed)
     Patient  visit on  03/10/2022  at  [facility] ed was for Fall , concussion and uti .  Have you been able to follow up with your primary care physician?Is getting some medicine for bladder infection from pcp but doing well other wise   The patient was or was not able to obtain any needed medicine or equipment. Will be getting an antibiotic today for uti   Are there diet recommendations that you are having difficulty following? N/A  At the emergency room visit patient was provided discharge instructions from the ED provider. Patient understands the instructions , No other needs at this time  [facility] [address] , [place] [address] Email : [email or web address]

## 2022-03-13 ENCOUNTER — Telehealth: Payer: Self-pay | Admitting: *Deleted

## 2022-03-13 NOTE — Telephone Encounter (Signed)
Post ED Visit - Positive Culture Follow-up  Culture report reviewed by antimicrobial stewardship pharmacist: [place] Team '[]'$  Elenor Quinones, Pharm.D. '[]'$  Heide Guile, Pharm.D., BCPS AQ-ID '[]'$  Parks Neptune, Pharm.D., BCPS '[]'$  Alycia Rossetti, Pharm.D., BCPS '[]'$  [place], Pharm.D., BCPS, AAHIVP '[]'$  Legrand Como, Pharm.D., BCPS, AAHIVP '[]'$  Salome Arnt, PharmD, BCPS '[]'$  Johnnette Gourd, PharmD, BCPS '[]'$  Hughes Better, PharmD, BCPS '[]'$  Leeroy Cha, PharmD '[]'$  Laqueta Linden, PharmD, BCPS '[]'$  Albertina Parr, PharmD  [place] Team '[]'$  Leodis Sias, PharmD '[]'$  Lindell Spar, PharmD '[]'$  Royetta Asal, PharmD '[]'$  Graylin Shiver, Rph '[]'$  Rema Fendt) Glennon Mac, PharmD '[]'$  Arlyn Dunning, PharmD '[]'$  Netta Cedars, PharmD '[]'$  Dia Sitter, PharmD '[]'$  Leone Haven, PharmD '[]'$  Gretta Arab, PharmD '[]'$  Theodis Shove, PharmD '[]'$  Peggyann Juba, PharmD '[]'$  Reuel Boom, PharmD   Positive urine culture Treated with Cephalexin, organism sensitive to the same and no further patient follow-up is required at this time.  Billey Gosling, PharmD  Harlon Flor Talley 03/13/2022, 1:46 PM

## 2022-03-24 ENCOUNTER — Telehealth (INDEPENDENT_AMBULATORY_CARE_PROVIDER_SITE_OTHER): Payer: Medicare PPO | Admitting: Family

## 2022-03-24 ENCOUNTER — Telehealth: Payer: Self-pay | Admitting: Family Medicine

## 2022-03-24 DIAGNOSIS — U071 COVID-19: Secondary | ICD-10-CM | POA: Diagnosis not present

## 2022-03-24 MED ORDER — MOLNUPIRAVIR EUA 200MG CAPSULE: 4.0000 | ORAL_CAPSULE | Freq: Two times a day (BID) | ORAL | 0 refills | Status: AC

## 2022-03-24 NOTE — Telephone Encounter (Signed)
08/12  note 04.23.47 pm   Patient Name: [name] Gender: Female DOB: 1936-06-13 Age: 86 Y 79 M 7 D Return Phone Number: [phone] (Primary), [phone] (Secondary) Address: City/ State/ Zip: [place]  [address] Client [facility] [place] Client Site [place] [facility] [place] Night Provider Garret Reddish- MD Contact Type Call Who Is Calling Patient / Member / Family / Caregiver Call Type Triage / Clinical Relationship To Patient Self Return Phone Number [phone] (Primary) Chief Complaint Sore Throat Reason for Call Symptomatic / Request for Health Information Initial Comment Caller states she has covid, with symptoms of sore throat, raspy throat , fever and feel awful. She would like possible medication ordered. Translation No Nurse Assessment Nurse: Moshe Cipro, RN, Debra Date/Time (Eastern Time): 03/22/2022 4:54:59 PM Confirm and document reason for call. If symptomatic, describe symptoms. ---Caller states has covid, sore throat, fever 99.4 oral . Does the patient have any new or worsening symptoms? ---Yes Will a triage be completed? ---Yes Related visit to physician within the last 2 weeks? ---No Does the PT have any chronic conditions? (i.e. diabetes, asthma, this includes High risk factors for pregnancy, etc.) ---No Is this a behavioral health or substance abuse call? ---No Guidelines Guideline Title Affirmed Question Affirmed Notes Nurse Date/Time (Eastern Time) COVID-19 - Diagnosed or Suspected [1] COVID-19 diagnosed by positive lab test (e.g., PCR, rapid self-test kit) AND [2] NO symptoms (e.g., cough, fever, others) Moshe Cipro, Therapist, sports, Debra 03/23/19   Disp. Time Eilene Ghazi Time) Disposition Final User 03/22/2022 5:03:42 [place], RN, Hilda Blades Final Disposition 03/22/2022 5:03:42 PM [place], RN, Hilda Blades Caller Disagree/Comply Comply Caller Understands Yes PreDisposition  Did not know what to do Care Advice Given Per Guideline HOME CARE: * You should be able to treat this at home. CALL BACK IF: * You have more questions CARE ADVICE given per COVID-19 - DIAGNOSED OR SUSPECTED (Adult) guideline.

## 2022-03-24 NOTE — Telephone Encounter (Signed)
Pt seeing [place] today.

## 2022-03-24 NOTE — Telephone Encounter (Signed)
03/23/22 01.52.38 PM  Patient Name: [name] Gender: Female DOB: 1936-03-24 Age: 85 Y 15 M 73 D Return Phone Number: [phone] (Primary), [phone] (Secondary) Address: City/ State/ Zip: [place]  [address] Client [facility] [place] [facility] [place] Client Site [place] [facility] [place] Night Provider Garret Reddish- MD Contact Type Call Who Is Calling Patient / Member / Family / Caregiver Call Type Triage / Clinical Relationship To Patient Self Return Phone Number [phone] (Secondary) Chief Complaint Cough Reason for Call Symptomatic / Request for [place] states she tested positive for COVID. She wants to know if she can get Paxlovid. She has a fever, sore throat, cough, and sneezing. Translation No Nurse Assessment Nurse: Ellery Plunk, RN, Danica Date/Time (Eastern Time): 03/23/2022 2:16:34 PM Confirm and document reason for call. If symptomatic, describe symptoms. ---Caller states she has COVID and wants paxlovid. called earlier today and was told to go to urgent care but states she will not do that because they will just "stick her in a hospital" and she does not want that. Does the patient have any new or worsening symptoms? ---No Please document clinical information provided and list any resource used. ---referred caller back to office during business hours for medication requests per client directives. Disp. Time Eilene Ghazi Time) Disposition Final User 03/23/2022 2:18:06 PM Clinical Call Yes Ellery Plunk, RN, Danica Final Disposition 03/23/2022 2:18:06 PM Clinical Call Yes [place], RN, Fredric Dine

## 2022-03-24 NOTE — Telephone Encounter (Signed)
03/23/22 09.49.30 AM   Patient Name: [name] Gender: Female DOB: Dec 03, 1935 Age: 86 Y 64 M 28 D Return Phone Number: [phone] (Primary), [phone] (Secondary) Address: City/ State/ Zip: [place]  [address] Client [place] [facility] [place] Client Site [place] [facility] [place] Night Provider Garret Reddish- MD Contact Type Call Who Is Calling Patient / Member / Family / Caregiver Call Type Triage / Clinical Relationship To Patient Self Return Phone Number [phone] (Primary) Chief Complaint Cold Symptom Reason for Call Symptomatic / Request for [place] states they have covid. Caller states she has a sore throat, runny nose, fever, scratchy throat. Translation No Nurse Assessment Nurse: Cristino Martes, RN, Joelene Millin Date/Time (Eastern Time): 03/23/2022 10:16:58 AM Confirm and document reason for call. If symptomatic, describe symptoms. ---Caller states they have Covid (home test yesterday. Symptoms started on Tues or Weds: sore throat (5-6/10), runny nose, fever, scratchy throat, coughing, productive. Pt lives in a senior living where there is about 10 or more people positive. Drinking well and keeping hydrated. Does the patient have any new or worsening symptoms? ---Yes Will a triage be completed? ---Yes Related visit to physician within the last 2 weeks? ---No Does the PT have any chronic conditions? (i.e. diabetes, asthma, this includes High risk factors for pregnancy, etc.) ---Yes List chronic conditions. ---HTN, on 7 medications Is this a behavioral health or substance abuse call? ---No Guidelines Guideline Title Affirmed Question Affirmed Notes Nurse Date/Time ([place] Time) COVID-19 - Diagnosed or Suspected [1] Fever > 100.0 F (37.8 C) AND [2] bedridden (e.g., CVA, chronic illness, [place], RN, Joelene Millin 03/23/2022 10:21:19 AM  Guidelines Guideline Title Affirmed  Question Affirmed Notes Nurse Date/Time ([place] Time) recovering from surgery) Disp. Time Eilene Ghazi Time) Disposition Final User 03/23/2022 10:29:30 AM See HCP within 4 Hours (or PCP triage) Yes Cristino Martes, RN, Joelene Millin Final Disposition 03/23/2022 10:29:30 AM See HCP within 4 Hours (or PCP triage) Yes [place], RN, Renea Ee Disagree/Comply Comply Caller Understands Yes PreDisposition Did not know what to do Care Advice Given Per Guideline SEE HCP (OR PCP TRIAGE) WITHIN 4 HOURS: * IF OFFICE WILL BE CLOSED AND NO PCP (PRIMARY CARE PROVIDER) SECOND-LEVEL TRIAGE: You need to be seen within the next 3 or 4 hours. A nearby Urgent Care Center [facility]) is often a good source of care. Another choice is to go to the ED. Go sooner if you become worse. * WEAR A MASK FOR 10 DAYS: Wear a well-fitted mask for 10 full days any time you are around others inside your home or in public. Do not go to places where you are unable to wear a mask. * AVOID TRAVEL: Avoid travel for 10 days after you tested positive for COVID-19. * [place] HANDS OFTEN: Wash hands often with soap and water. After coughing or sneezing are important times. If soap and water are not available, use an alcohol-based hand sanitizer with at least 60% alcohol, covering all surfaces of your hands and rubbing them together until they feel dry. Avoid touching your eyes, nose, and mouth with unwashed hands. * STAY HOME A MINIMUM OF 5 DAYS: People with MILD COVID-19 can STOP HOME ISOLATION AFTER 5 DAYS if (1) fever has been gone for 24 hours (without using fever medicine) AND (2) symptoms are better. Continue to wear a well-fitted mask for a full 10 days when around others. FEVER MEDICINES: CALL BACK IF: * You become worse CARE ADVICE given per COVID-19 - DIAGNOSED OR  SUSPECTED (Adult) guideline. Comments User: Jim Desanctis, RN Date/Time Eilene Ghazi Time): 03/23/2022 10:23:58 AM 98% SPO2 HR-71 User: Jim Desanctis, RN Date/Time  Eilene Ghazi Time): 03/23/2022 10:25:02 AM Temp 100.7 (oral) Referrals [facility]

## 2022-03-24 NOTE — Telephone Encounter (Signed)
Patient Name: [name] Gender: Female DOB: [birth year] Age: 86 Y 31 M 9 D Return Phone Number: [phone] (Primary), [phone] (Secondary) Address: City/ State/ Zip: [place]  [address] Client [place] [facility] [place] Client Site [place] [facility] [place] Night Provider Garret Reddish- MD Contact Type Call Who Is Calling Patient / Member / Family / Caregiver Call Type Triage / Clinical Relationship To Patient Self Return Phone Number [phone] (Secondary) Chief Complaint Cough Reason for Call Medication Question / Request Initial Comment Caller states she tested positive for COVID and would like to know if Dr. Yong Channel can recommend anything that she can take. Caller states she has a fever, cough, sore throat and dizziness. Translation No Nurse Assessment Nurse: Burnadette Peter, RN, Elmo Putt Date/Time (Eastern Time): 03/24/2022 8:02:09 AM Confirm and document reason for call. If symptomatic, describe symptoms. ---Caller states she tested positive for COVID on Saturday. Caller states her symptoms started a few days before Saturday. Caller states she has a fever, cough, sore throat and dizziness. Caller states her temperature is currently 99.8. Caller denies any difficulties breathing. Does the patient have any new or worsening symptoms? ---Yes Will a triage be completed? ---Yes Related visit to physician within the last 2 weeks? ---No Does the PT have any chronic conditions? (i.e. diabetes, asthma, this includes High risk factors for pregnancy, etc.) ---No Is this a behavioral health or substance abuse call? ---No Guidelines Guideline Title Affirmed Question Affirmed Notes Nurse Date/Time ([place] Time) COVID-19 - Diagnosed or Suspected MILD difficulty breathing (e.g., minimal/no SOB at rest, SOB with walking, pulse <947) Burnadette Peter, RN, Elmo Putt 6/54/6503 5:46:56 AM   Disp. Time Eilene Ghazi Time) Disposition Final  User 03/24/2022 8:12:37 AM See HCP within 4 Hours (or PCP triage) Yes Burnadette Peter, RN, Elmo Putt Final Disposition 03/24/2022 8:12:37 AM See HCP within 4 Hours (or PCP triage) Yes Burnadette Peter, RN, Laren Everts Disagree/Comply Disagree Caller Understands Yes PreDisposition Call Doctor Care Advice Given Per Guideline SEE HCP (OR PCP TRIAGE) WITHIN 4 HOURS: * IF OFFICE WILL BE OPEN: You need to be seen within the next 3 or 4 hours. Call your doctor (or NP/PA) now or as soon as the office opens. SOURCES OF CARE: * UCC: Some UCCs can manage patients who are stable and have less serious symptoms (e.g., minor illnesses and injuries). The triager must know the [facility] capabilities before sending a patient there. If unsure, call ahead. * OFFICE: If patient sounds stable and not seriously ill, consult PCP (or follow your office policy) to see if patient can be seen NOW in office. CALL BACK IF: * You become worse CARE ADVICE given per COVID-19 - DIAGNOSED OR SUSPECTED (Adult) guideline. Comments User: Braxton Feathers, RN Date/Time Eilene Ghazi Time): 03/24/2022 8:09:32 AM Caller states she feels short of breath when walking. User: Braxton Feathers, RN Date/Time Eilene Ghazi Time): 03/24/2022 8:10:12 AM Caller states her current O2 saturation is 96% and her pulse is 78. User: Braxton Feathers, RN Date/Time Eilene Ghazi Time): 03/24/2022 8:22:10 AM Caller warm transferred to office to make a virtual appointment. Referrals REFERRED TO PCP OFFICE

## 2022-03-24 NOTE — Telephone Encounter (Addendum)
03/22/22 08.56.07 PM   Guidelines Guideline Title Affirmed Question Affirmed Notes Nurse Date/Time Eilene Ghazi Time) > 64 years, obesity with BMI 30 or higher, pregnant, chronic lung disease or other chronic medical condition) AND [2] COVID symptoms (e.g., cough, fever) (Exceptions: Already seen by PCP and no new or worsening symptoms.) Urination Pain - Female Fever > 100.4 F (38.0 C) Zayas, RN, Melissa 03/22/2022 9:43:47 PM Disp. Time Eilene Ghazi Time) Disposition Final User 03/22/2022 9:43:21 PM Call PCP within 24 Hours [place], RN, [facility] 03/22/2022 9:46:27 PM See HCP within 4 Hours (or PCP triage) Yes Ardine Bjork, RN, Melissa Final Disposition 03/22/2022 9:46:27 PM See HCP within 4 Hours (or PCP triage) Yes Zayas, RN, Lenna Sciara Caller Disagree/Comply Disagree Caller Understands Yes PreDisposition Did not know what to do Care Advice Given Per Guideline CALL PCP WITHIN 24 HOURS: * You need to discuss this with your doctor (or NP/PA) within the next 24 hours. * IF OFFICE WILL BE CLOSED: I'll page the on-call provider now. EXCEPTION: from 9 pm to 9 am. Since this isn't urgent, we'll hold the page until morning. CALL BACK IF: * You become worse CARE ADVICE given per COVID-19 - DIAGNOSED OR SUSPECTED (Adult) guideline. SEE HCP (OR PCP TRIAGE) WITHIN 4 HOURS: * IF OFFICE WILL BE CLOSED AND NO PCP (PRIMARY CARE PROVIDER) SECOND-LEVEL TRIAGE: You need to be seen within the next 3 or 4 hours. A nearby Urgent Care Center [facility]) is often a good source of care. Another choice is to go to the ED. Go sooner if you become worse. CALL BACK IF: * You become worse CARE ADVICE given per Urination Pain - Female (Adult) guideline.   Comments User: Dub Mikes, RN Date/Time Eilene Ghazi Time): 03/22/2022 9:40:06 PM Pt states having UTI sxs-urinary frequency,has dysuria. User: Dub Mikes, RN Date/Time Eilene Ghazi Time): 03/22/2022 9:40:22 PM Finished abx for UTI User: Dub Mikes, RN Date/Time  Eilene Ghazi Time): 03/22/2022 9:48:48 PM Pt advised to call back 9am since over 9pm for on-call provider to be called due to sxs. Just finished Cephalexin '500mg'$  for UTI. Referrals REFERRED TO PCP OFFICE GO TO FACILITY REFUSED

## 2022-03-24 NOTE — Progress Notes (Signed)
MyChart Video Visit    Virtual Visit via Video Note   This format is felt to be most appropriate for this patient at this time. Physical exam was limited by quality of the video and audio technology used for the visit. CMA was able to get the patient set up on a video visit.  Patient location: Home. Patient and provider in visit Provider location: Office  I discussed the limitations of evaluation and management by telemedicine and the availability of in person appointments. The patient expressed understanding and agreed to proceed.  Visit Date: 03/24/2022  Today's healthcare provider: Jeanie Sewer, NP     Subjective:   Patient ID: [name], female    DOB: 09-Dec-1935, 86 y.o.   MRN: [id number]  Chief Complaint  Patient presents with   Covid Positive    Pt tested positive for Covid at home on 8/11. Symptoms sore throat, fever of 100.7, wet cough, sneezing, Headaches. Has tried tylenol which did help her headaches but not other symptom.     HPI Upper Respiratory Infection: Symptoms include congestion, low grade fever, nasal congestion, productive cough with  clear colored sputum, sneezing, and sore throat.  Onset of symptoms was 2 days ago, gradually worsening since that time. She is not drinking much. Evaluation to date: none. Treatment to date:  Tylenol .   Assessment & Plan:   Problem List Items Addressed This Visit   None Visit Diagnoses     COVID-19    -  Primary Sending Molnupiravir, pt advised of FDA label for emergency use, how to take, & SE. Advised of CDC guidelines for masking if out in public. OK to continue taking OTC sinus or pain meds. Encouraged to monitor & notify office of any worsening symptoms: increased shortness of breath, weakness, and signs of dehydration. Instructed to rest and hydrate well.     Relevant Medications   molnupiravir EUA (LAGEVRIO) 200 mg CAPS capsule      Past Medical History:  Diagnosis Date   Anxiety    Cancer  ([place])    basal cell on nose   Colon polyps    Complication of anesthesia    takes longer to wake up- admitted overnight after colonoscopy   COVID 05/2021   mild case   Depression    GERD (gastroesophageal reflux disease)    Glaucoma    Headache    migraines   Hx: UTI (urinary tract infection)    Hypercholesteremia    Hypertension    Lichen simplex chronicus    Lumbar stenosis with neurogenic claudication    Macular degeneration    Medical history non-contributory    Osteoarthritis    Osteopenia    of the hip   PONV (postoperative nausea and vomiting)    Stress incontinence, female    Unsteady gait    Wears glasses    Whooping cough    as a baby    Past Surgical History:  Procedure Laterality Date   ABDOMINAL HYSTERECTOMY  1977   ovaries remain   ANTERIOR LAT LUMBAR FUSION N/A 10/26/2017   Procedure: LUMBAR THREE- LUMBAR FOUR, LUMBAR FOUR- LUMBAR FIVE ANTEROLATERAL LUMBAR INTERBODY ARTHRODESIS;  Surgeon: Jovita Gamma, MD;  Location: [facility];  Service: Neurosurgery;  Laterality: N/A;  LUMBAR 3- LUMBAR 4, LUMBAR 4- LUMBAR 5 ANTEROLATERAL LUMBAR INTERBODY ARTHRODESIS, LUMBAR 3- LUMBAR 4, LUMBAR 4- LUMBAR 5 PERCUTANEOUS PEDICLE SCREW FIXATION   APPENDECTOMY     APPLICATION OF WOUND VAC Left 04/27/2021   Procedure:  APPLICATION OF INCISIONAL WOUND VAC;  Surgeon: Erle Crocker, MD;  Location: [place];  Service: Orthopedics;  Laterality: Left;   APPLICATION OF WOUND VAC Left 07/01/2021   Procedure: APPLICATION OF WOUND VAC;  Surgeon: Cindra Presume, MD;  Location: [facility];  Service: Plastics;  Laterality: Left;   BLADDER REPAIR     x two   CHOLECYSTECTOMY     COLONOSCOPY     EYE SURGERY  11/14, 12/14   Macular Dengeneration, Glaucoma   EYE SURGERY  2016   left eye   I & D EXTREMITY Left 04/27/2021   Procedure: IRRIGATION AND DEBRIDEMENT LEFT LEG;  Surgeon: Erle Crocker, MD;  Location: [facility];  Service: Orthopedics;  Laterality: Left;   INCISION AND DRAINAGE OF  WOUND Right 04/27/2021   Procedure: IRRIGATION AND DEBRIDEMENT WOUND;  Surgeon: Erle Crocker, MD;  Location: [facility];  Service: Orthopedics;  Laterality: Right;   LESION REMOVAL Right 06/28/2013   Procedure: EXCISION 3 cm right labial sebaceous cyst;  Surgeon: Lyman Speller, MD;  Location: [facility];  Service: Gynecology;  Laterality: Right;   LUMBAR LAMINECTOMY/DECOMPRESSION MICRODISCECTOMY Right 03/20/2016   Procedure: Right - Lumbar four-five lumbar laminotomy, foraminotomy, and possible microdiscectomy;  Surgeon: Jovita Gamma, MD;  Location: [facility];  Service: Neurosurgery;  Laterality: Right;  right   LUMBAR PERCUTANEOUS PEDICLE SCREW 2 LEVEL  10/26/2017   Procedure: LUMBAR THREE- LUMBAR FOUR, LUMBAR FOUR- LUMBAR FIVE PERCUTANEOUS PEDICLE SCREW FIXATION;  Surgeon: Jovita Gamma, MD;  Location: [facility];  Service: Neurosurgery;;   MOHS SURGERY     procedure to remove basal cell   SKIN SPLIT GRAFT Left 07/01/2021   Procedure: SKIN GRAFT SPLIT THICKNESS;  Surgeon: Cindra Presume, MD;  Location: [facility];  Service: Plastics;  Laterality: Left;  1 hour   TONSILLECTOMY AND ADENOIDECTOMY     URETHRAL SLING  2007   URETHRAL SLING  1/12   midurethral    vertebroplasty secondary to traumatic compression fracture      Outpatient Medications Prior to Visit  Medication Sig Dispense Refill   acetaminophen (TYLENOL) 500 MG tablet Take 1,000 mg by mouth every 6 (six) hours as needed for moderate pain or headache.     ALPRAZolam (XANAX) 0.5 MG tablet TAKE 1/2 TO 1 TABLET(0.25 TO 0.5 MG) BY MOUTH TWICE DAILY AS NEEDED FOR ANXIETY (Patient taking differently: Take 0.5 mg by mouth 2 (two) times daily as needed for anxiety.) 20 tablet 0   aspirin 81 MG tablet Take 81 mg by mouth daily.     brimonidine-timolol (COMBIGAN) 0.2-0.5 % ophthalmic solution Place 1 drop into both eyes every 12 (twelve) hours.     clobetasol ointment (TEMOVATE) 0.05 % apply to affected area twice a day if needed DO NOT  USE FOR MORE THAN 5 DAYS 30 g 0   ezetimibe-simvastatin (VYTORIN) 10-20 MG tablet TAKE 1 TABLET BY MOUTH EVERY DAY 90 tablet 3   Multiple Vitamins-Minerals (MULTIVITAMIN ADULT) CHEW Chew 2 each by mouth daily. Nature Made for Her     nadolol (CORGARD) 40 MG tablet TAKE 1 TABLET BY MOUTH AT BEDTIME 90 tablet 2   omeprazole (PRILOSEC) 40 MG capsule TAKE 1 CAPSULE(40 MG) BY MOUTH DAILY (Patient taking differently: Take 40 mg by mouth daily.) 90 capsule 0   ondansetron (ZOFRAN) 4 MG tablet Take 1 tablet (4 mg total) by mouth every 6 (six) hours. 12 tablet 0   sodium chloride (OCEAN) 0.65 % SOLN nasal spray Place 1  spray into both nostrils 2 (two) times daily as needed for congestion.     traZODone (DESYREL) 100 MG tablet TAKE 1/4 TO 1/2 TABLET BY MOUTH AT BEDTIME IF NEEDED FOR SLEEP. 45 tablet 3   venlafaxine XR (EFFEXOR-XR) 150 MG 24 hr capsule TAKE 1 CAPSULE BY MOUTH TWICE A DAY 180 capsule 3   Zinc Oxide (DESITIN) 13 % CREA Apply 1 application topically daily as needed (itching).     nitrofurantoin, macrocrystal-monohydrate, (MACROBID) 100 MG capsule Take 1 capsule (100 mg total) by mouth 2 (two) times daily. 14 capsule 0   No facility-administered medications prior to visit.    Allergies  Allergen Reactions   Citrate Of Magnesia Other (See Comments)    confusion   Codeine Nausea And Vomiting   Shingrix [Zoster Vac Recomb Adjuvanted] Swelling and Rash    Rash and temp x several days. (low grade 100 -101 )        Objective:   Physical Exam Vitals and nursing note reviewed.  Constitutional:      General: She is not in acute distress.    Appearance: Normal appearance.  HENT:     Head: Normocephalic.  Pulmonary:     Effort: No respiratory distress.  Musculoskeletal:     Cervical back: Normal range of motion.  Skin:    General: Skin is dry.     Coloration: Skin is not pale.  Neurological:     Mental Status: She is alert and oriented to person, place, and time.  Psychiatric:         Mood and Affect: Mood normal.   LMP 08/11/1974   Wt Readings from Last 3 Encounters:  03/10/22 144 lb (65.3 kg)  03/10/22 147 lb (66.7 kg)  12/17/21 140 lb (63.5 kg)      I discussed the assessment and treatment plan with the patient. The patient was provided an opportunity to ask questions and all were answered. The patient agreed with the plan and demonstrated an understanding of the instructions.   The patient was advised to call back or seek an in-person evaluation if the symptoms worsen or if the condition fails to improve as anticipated.  Jeanie Sewer, NP [place] [phone] (phone) [phone] (fax)  [place]

## 2022-04-01 ENCOUNTER — Telehealth: Payer: Self-pay | Admitting: Family Medicine

## 2022-04-01 NOTE — Telephone Encounter (Signed)
See below

## 2022-04-01 NOTE — Telephone Encounter (Signed)
Pt states: -Attempting to amend will -Needs letter stating competent to make decisions -Had an appointment today (08/22) at 1:00pm but it was canceled in error.  Pt requests: -Call from PCP directly.    Addressee for any letters:  [employer] [place] [address] [place], [place] [address] [phone]

## 2022-04-02 NOTE — Telephone Encounter (Signed)
Poor relationship with daughter- planned to take her out of will before husband passed but passed quickly. She would like to move forward with this change. Daughter did not come see husband before he died as requested by husband.  You may type and send the following to law offices as requested  To whom it may concern,   Vivienne Lambson is a primary care patient of mine (DOB [birth year]). She is competent to make decisions about her will and testament.   Thanks, Garret Reddish, MD

## 2022-04-02 NOTE — Telephone Encounter (Signed)
Also can use same day or virtual slot to work her in for frequent urination visit

## 2022-04-03 ENCOUNTER — Other Ambulatory Visit (HOSPITAL_COMMUNITY)
Admission: RE | Admit: 2022-04-03 | Discharge: 2022-04-03 | Disposition: A | Discharge status: Home / Self Care | Payer: Medicare PPO | Source: Ambulatory Visit | Attending: Family Medicine | Admitting: Family Medicine

## 2022-04-03 ENCOUNTER — Ambulatory Visit (INDEPENDENT_AMBULATORY_CARE_PROVIDER_SITE_OTHER): Payer: Medicare PPO | Admitting: Family

## 2022-04-03 ENCOUNTER — Ambulatory Visit: Payer: Medicare PPO | Admitting: Family Medicine

## 2022-04-03 ENCOUNTER — Encounter: Payer: Self-pay | Admitting: Family

## 2022-04-03 VITALS — BP 150/72 | HR 69 | Temp 98.0°F | Ht 60.0 in | Wt 144.4 lb

## 2022-04-03 DIAGNOSIS — B3731 Acute candidiasis of vulva and vagina: Secondary | ICD-10-CM

## 2022-04-03 DIAGNOSIS — R300 Dysuria: Secondary | ICD-10-CM

## 2022-04-03 LAB — POCT URINALYSIS DIPSTICK
Bilirubin, UA: NEGATIVE
Blood, UA: NEGATIVE
Glucose, UA: NEGATIVE
Ketones, UA: NEGATIVE
Leukocytes, UA: NEGATIVE
Nitrite, UA: NEGATIVE
Protein, UA: NEGATIVE
Spec Grav, UA: >=1.03 — AB (ref 1.010–1.025)
Urobilinogen, UA: 0.2 E.U./dL
pH, UA: 6 (ref 5.0–8.0)

## 2022-04-03 MED ORDER — FLUCONAZOLE 150 MG PO TABS: 150.0000 mg | ORAL_TABLET | ORAL | 0 refills | Status: DC | PRN

## 2022-04-03 NOTE — Telephone Encounter (Signed)
Ok to schedule.

## 2022-04-03 NOTE — Patient Instructions (Signed)
It was very nice to see you today!    I sent Fluconazole to treat your vaginal itching and burning. Follow the directions on the bottle.  Let us know if your symptoms are not better by next week.       PLEASE NOTE:  If you had any lab tests please let us know if you have not heard back within a few days. You may see your results on MyChart before we have a chance to review them but we will give you a call once they are reviewed by [place]. If we ordered any referrals today, please let us know if you have not heard from their office within the next week.

## 2022-04-03 NOTE — Telephone Encounter (Signed)
Patient scheduled.

## 2022-04-03 NOTE — Progress Notes (Signed)
Patient ID: [name], female    DOB: 1936/04/20, 86 y.o.   MRN: [id number]  Chief Complaint  Patient presents with   Urinary Frequency    Pt c/o urinary frequency and dysuria for a couple of months.     HPI: Urinary symptoms: Patient c/o  dysuria, frequency, urgency. Denies: flank pain, pelvic pain, low back pain, foul odor, cloudy urine, hematuria. Other sx: vaginal d/c: none, vaginal itching: yes. Duration of sx: 30 ; Home tx: none; Denies  nausea, fever. Reports last UTI was 3 weeks ago.    Assessment & Plan:  1. Vaginal yeast infection - pt finished antibiotic about 2 weeks ago, reports dysuria and itching. Sending urine out for testing, but will go ahead and treat with Diflucan, advised on use & SE & to call back next week if sx are not better.  - fluconazole (DIFLUCAN) 150 MG tablet; Take 1 tablet (150 mg total) by mouth every three (3) days as needed.  Dispense: 2 tablet; Refill: 0 - Urine cytology ancillary only  2. Dysuria - UA negative. Believe sx r/t yeast infection, treating above.  - POCT Urinalysis Dipstick   Subjective:    Outpatient Medications Prior to Visit  Medication Sig Dispense Refill   acetaminophen (TYLENOL) 500 MG tablet Take 1,000 mg by mouth every 6 (six) hours as needed for moderate pain or headache.     ALPRAZolam (XANAX) 0.5 MG tablet TAKE 1/2 TO 1 TABLET(0.25 TO 0.5 MG) BY MOUTH TWICE DAILY AS NEEDED FOR ANXIETY (Patient taking differently: Take 0.5 mg by mouth 2 (two) times daily as needed for anxiety.) 20 tablet 0   aspirin 81 MG tablet Take 81 mg by mouth daily.     brimonidine-timolol (COMBIGAN) 0.2-0.5 % ophthalmic solution Place 1 drop into both eyes every 12 (twelve) hours.     clobetasol ointment (TEMOVATE) 0.05 % apply to affected area twice a day if needed DO NOT USE FOR MORE THAN 5 DAYS 30 g 0   ezetimibe-simvastatin (VYTORIN) 10-20 MG tablet TAKE 1 TABLET BY MOUTH EVERY DAY 90 tablet 3   Multiple Vitamins-Minerals (MULTIVITAMIN  ADULT) CHEW Chew 2 each by mouth daily. Nature Made for Her     nadolol (CORGARD) 40 MG tablet TAKE 1 TABLET BY MOUTH AT BEDTIME 90 tablet 2   omeprazole (PRILOSEC) 40 MG capsule TAKE 1 CAPSULE(40 MG) BY MOUTH DAILY (Patient taking differently: Take 40 mg by mouth daily.) 90 capsule 0   ondansetron (ZOFRAN) 4 MG tablet Take 1 tablet (4 mg total) by mouth every 6 (six) hours. 12 tablet 0   sodium chloride (OCEAN) 0.65 % SOLN nasal spray Place 1 spray into both nostrils 2 (two) times daily as needed for congestion.     traZODone (DESYREL) 100 MG tablet TAKE 1/4 TO 1/2 TABLET BY MOUTH AT BEDTIME IF NEEDED FOR SLEEP. 45 tablet 3   venlafaxine XR (EFFEXOR-XR) 150 MG 24 hr capsule TAKE 1 CAPSULE BY MOUTH TWICE A DAY 180 capsule 3   Zinc Oxide (DESITIN) 13 % CREA Apply 1 application topically daily as needed (itching).     No facility-administered medications prior to visit.   Past Medical History:  Diagnosis Date   Anxiety    Cancer ([place])    basal cell on nose   Colon polyps    Complication of anesthesia    takes longer to wake up- admitted overnight after colonoscopy   COVID 05/2021   mild case   Depression    GERD (  gastroesophageal reflux disease)    Glaucoma    Headache    migraines   Hx: UTI (urinary tract infection)    Hypercholesteremia    Hypertension    Lichen simplex chronicus    Lumbar stenosis with neurogenic claudication    Macular degeneration    Medical history non-contributory    Osteoarthritis    Osteopenia    of the hip   PONV (postoperative nausea and vomiting)    Stress incontinence, female    Unsteady gait    Wears glasses    Whooping cough    as a baby   Past Surgical History:  Procedure Laterality Date   ABDOMINAL HYSTERECTOMY  1977   ovaries remain   ANTERIOR LAT LUMBAR FUSION N/A 10/26/2017   Procedure: LUMBAR THREE- LUMBAR FOUR, LUMBAR FOUR- LUMBAR FIVE ANTEROLATERAL LUMBAR INTERBODY ARTHRODESIS;  Surgeon: Jovita Gamma, MD;  Location: [facility];   Service: Neurosurgery;  Laterality: N/A;  LUMBAR 3- LUMBAR 4, LUMBAR 4- LUMBAR 5 ANTEROLATERAL LUMBAR INTERBODY ARTHRODESIS, LUMBAR 3- LUMBAR 4, LUMBAR 4- LUMBAR 5 PERCUTANEOUS PEDICLE SCREW FIXATION   APPENDECTOMY     APPLICATION OF WOUND VAC Left 04/27/2021   Procedure: APPLICATION OF INCISIONAL WOUND VAC;  Surgeon: Erle Crocker, MD;  Location: [facility];  Service: Orthopedics;  Laterality: Left;   APPLICATION OF WOUND VAC Left 07/01/2021   Procedure: APPLICATION OF WOUND VAC;  Surgeon: Cindra Presume, MD;  Location: [facility];  Service: Plastics;  Laterality: Left;   BLADDER REPAIR     x two   CHOLECYSTECTOMY     COLONOSCOPY     EYE SURGERY  11/14, 12/14   Macular Dengeneration, Glaucoma   EYE SURGERY  2016   left eye   I & D EXTREMITY Left 04/27/2021   Procedure: IRRIGATION AND DEBRIDEMENT LEFT LEG;  Surgeon: Erle Crocker, MD;  Location: [facility];  Service: Orthopedics;  Laterality: Left;   INCISION AND DRAINAGE OF WOUND Right 04/27/2021   Procedure: IRRIGATION AND DEBRIDEMENT WOUND;  Surgeon: Erle Crocker, MD;  Location: [facility];  Service: Orthopedics;  Laterality: Right;   LESION REMOVAL Right 06/28/2013   Procedure: EXCISION 3 cm right labial sebaceous cyst;  Surgeon: Lyman Speller, MD;  Location: [facility];  Service: Gynecology;  Laterality: Right;   LUMBAR LAMINECTOMY/DECOMPRESSION MICRODISCECTOMY Right 03/20/2016   Procedure: Right - Lumbar four-five lumbar laminotomy, foraminotomy, and possible microdiscectomy;  Surgeon: Jovita Gamma, MD;  Location: [facility];  Service: Neurosurgery;  Laterality: Right;  right   LUMBAR PERCUTANEOUS PEDICLE SCREW 2 LEVEL  10/26/2017   Procedure: LUMBAR THREE- LUMBAR FOUR, LUMBAR FOUR- LUMBAR FIVE PERCUTANEOUS PEDICLE SCREW FIXATION;  Surgeon: Jovita Gamma, MD;  Location: [facility];  Service: Neurosurgery;;   MOHS SURGERY     procedure to remove basal cell   SKIN SPLIT GRAFT Left 07/01/2021   Procedure: SKIN GRAFT SPLIT THICKNESS;   Surgeon: Cindra Presume, MD;  Location: [facility];  Service: Plastics;  Laterality: Left;  1 hour   TONSILLECTOMY AND ADENOIDECTOMY     URETHRAL SLING  2007   URETHRAL SLING  1/12   midurethral    vertebroplasty secondary to traumatic compression fracture     Allergies  Allergen Reactions   Citrate Of Magnesia Other (See Comments)    confusion   Codeine Nausea And Vomiting   Shingrix [Zoster Vac Recomb Adjuvanted] Swelling and Rash    Rash and temp x several days. (low grade 100 -101 )  Objective:    Physical Exam Vitals and nursing note reviewed.  Constitutional:      Appearance: Normal appearance.  Cardiovascular:     Rate and Rhythm: Normal rate and regular rhythm.  Pulmonary:     Effort: Pulmonary effort is normal.     Breath sounds: Normal breath sounds.  Musculoskeletal:        General: Normal range of motion.  Skin:    General: Skin is warm and dry.  Neurological:     Mental Status: She is alert.  Psychiatric:        Mood and Affect: Mood normal.        Behavior: Behavior normal.    BP (!) 150/72 (BP Location: Left Arm, Patient Position: Sitting, Cuff Size: Large)   Pulse 69   Temp 98 F (36.7 C) (Temporal)   Ht 5' (1.524 m)   Wt 144 lb 6.4 oz (65.5 kg)   LMP 08/11/1974   SpO2 97%   BMI 28.20 kg/m  Wt Readings from Last 3 Encounters:  04/03/22 144 lb 6.4 oz (65.5 kg)  03/10/22 144 lb (65.3 kg)  03/10/22 147 lb (66.7 kg)       Jeanie Sewer, NP

## 2022-04-04 ENCOUNTER — Other Ambulatory Visit: Payer: Self-pay | Admitting: Family

## 2022-04-04 DIAGNOSIS — B3731 Acute candidiasis of vulva and vagina: Secondary | ICD-10-CM

## 2022-04-07 LAB — URINE CYTOLOGY ANCILLARY ONLY: Candida Urine: NEGATIVE

## 2022-04-15 NOTE — Telephone Encounter (Signed)
Form has been refaxed, called and lm on pt vm to make aware.

## 2022-04-18 ENCOUNTER — Telehealth: Payer: Self-pay | Admitting: Pharmacist

## 2022-04-18 NOTE — Progress Notes (Unsigned)
Chronic Care Management Pharmacy Assistant   Name: [name]  MRN: [id number] DOB: 06-11-1936   Reason for Encounter: General Adherence Call    Recent office visits:  04/03/2022 OV (Fam Med) Gabrielle Sewer, NP; Sending urine out for testing, but will go ahead and treat with Diflucan.  03/24/2022 VV (Fam Med) Gabrielle Sewer, NP; Sending Molnupiravir, pt advised of FDA label for emergency use, how to take, & SE.  03/10/2022 OV (PCP) Gabrielle Olp, MD;  Empiric treatment with: Keflex likely 500 mg 3 times a day but we discussed needing to collect the urine before starting antibiotics  Recent consult visits:  None  Hospital visits:  03/10/2022 ED visit for Fall -we will send for culture and treat with Keflex per PCPs plan.  Medications: Outpatient Encounter Medications as of 04/18/2022  Medication Sig Note   acetaminophen (TYLENOL) 500 MG tablet Take 1,000 mg by mouth every 6 (six) hours as needed for moderate pain or headache.    ALPRAZolam (XANAX) 0.5 MG tablet TAKE 1/2 TO 1 TABLET(0.25 TO 0.5 MG) BY MOUTH TWICE DAILY AS NEEDED FOR ANXIETY (Patient taking differently: Take 0.5 mg by mouth 2 (two) times daily as needed for anxiety.)    aspirin 81 MG tablet Take 81 mg by mouth daily.    brimonidine-timolol (COMBIGAN) 0.2-0.5 % ophthalmic solution Place 1 drop into both eyes every 12 (twelve) hours.    clobetasol ointment (TEMOVATE) 0.05 % apply to affected area twice a day if needed DO NOT USE FOR MORE THAN 5 DAYS    ezetimibe-simvastatin (VYTORIN) 10-20 MG tablet TAKE 1 TABLET BY MOUTH EVERY DAY 06/28/2021: Takes at bedtime   fluconazole (DIFLUCAN) 150 MG tablet Take 1 tablet (150 mg total) by mouth every three (3) days as needed.    Multiple Vitamins-Minerals (MULTIVITAMIN ADULT) CHEW Chew 2 each by mouth daily. Nature Made for Her    nadolol (CORGARD) 40 MG tablet TAKE 1 TABLET BY MOUTH AT BEDTIME    omeprazole (PRILOSEC) 40 MG capsule TAKE 1 CAPSULE(40 MG) BY MOUTH  DAILY (Patient taking differently: Take 40 mg by mouth daily.)    ondansetron (ZOFRAN) 4 MG tablet Take 1 tablet (4 mg total) by mouth every 6 (six) hours.    sodium chloride (OCEAN) 0.65 % SOLN nasal spray Place 1 spray into both nostrils 2 (two) times daily as needed for congestion.    traZODone (DESYREL) 100 MG tablet TAKE 1/4 TO 1/2 TABLET BY MOUTH AT BEDTIME IF NEEDED FOR SLEEP.    venlafaxine XR (EFFEXOR-XR) 150 MG 24 hr capsule TAKE 1 CAPSULE BY MOUTH TWICE A DAY    Zinc Oxide (DESITIN) 13 % CREA Apply 1 application topically daily as needed (itching).    No facility-administered encounter medications on file as of 04/18/2022.   Contacted [name] for General Review Call   Chart Review:  Have there been any documented new, changed, or discontinued medications since last visit? {yes/no:20286} (If yes, include name, dose, frequency, date) Has there been any documented recent hospitalizations or ED visits since last visit with Clinical Pharmacist? {yes/no:20286} Brief Summary (including medication and/or Diagnosis changes):   Adherence Review:  Does the Clinical Pharmacist Assistant have access to adherence rates? {yes/no:20286} Adherence rates for STAR metric medications (List medication(s)/day supply/ last 2 fill dates). Adherence rates for medications indicated for disease state being reviewed (List medication(s)/day supply/ last 2 fill dates). Does the patient have >5 day gap between last estimated fill dates for any of the  above medications or other medication gaps? {yes/no:20286} Reason for medication gaps.   Disease State Questions:  Able to connect with Patient? {yes/no:20286} Did patient have any problems with their health recently? {yes/no:20286} Note problems and Concerns: Have you had any admissions or emergency room visits or worsening of your condition(s) since last visit? {yes/no:20286} Details of ED visit, hospital visit and/or worsening condition(s): Have  you had any visits with new specialists or providers since your last visit? {yes/no:20286} Explain: Have you had any new health care problem(s) since your last visit? {yes/no:20286} New problem(s) reported: Have you run out of any of your medications since you last spoke with clinical pharmacist? {yes/no:20286} What caused you to run out of your medications? Are there any medications you are not taking as prescribed? {yes/no:20286} What kept you from taking your medications as prescribed? Are you having any issues or side effects with your medications? {yes/no:20286} Note of issues or side effects: Do you have any other health concerns or questions you want to discuss with your Clinical Pharmacist before your next visit? {yes/no:20286} Note additional concerns and questions from Patient. Are there any health concerns that you feel we can do a better job addressing? {yes/no:20286} Note Patient's response. Are you having any problems with any of the following since the last visit: (select all that apply)  {General Call:27390}  Details: 12. Any falls since last visit? {yes/no:20286}  Details: 13. Any increased or uncontrolled pain since last visit? {yes/no:20286}  Details: 14. Next visit Type: {Telephone/Office:25179}       Visit with:        Date:        Time:  22. Additional Details? {yes/no:20286}    Care Gaps: Medicare Annual Wellness: Due now, patient declined to schedule at this time. Hemoglobin A1C: 5.2% on 01/25/2018 Colonoscopy: Aged out Dexa Scan: Completed Mammogram: Completed 04/05/2021  Future Appointments  Date Time Provider [place]  06/24/2022  9:20 AM Gabrielle Olp, MD [facility]   Star Rating Drugs: Ezetimibe-Simvastatin 10-20 mg last filled 01/17/2022 90 DS  [name], [place] Pharmacist Assistant [phone]

## 2022-04-21 ENCOUNTER — Ambulatory Visit: Payer: Medicare PPO | Admitting: Pharmacist

## 2022-04-21 DIAGNOSIS — E785 Hyperlipidemia, unspecified: Secondary | ICD-10-CM

## 2022-04-21 DIAGNOSIS — N39498 Other specified urinary incontinence: Secondary | ICD-10-CM
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
## 2022-04-21 NOTE — Patient Instructions (Addendum)
Visit Information   Goals Addressed             This Visit's Progress    Track and Manage My Blood Pressure-Hypertension   On track    Timeframe:  Long-Range Goal Priority:  High Start Date:  07/15/21                           Expected End Date:  01/13/22                     Follow Up Date 10/13/21    - check blood pressure weekly - choose a place to take my blood pressure (home, clinic or office, retail store) - write blood pressure results in a log or diary    Why is this important?   You won't feel high blood pressure, but it can still hurt your blood vessels.  High blood pressure can cause heart or kidney problems. It can also cause a stroke.  Making lifestyle changes like losing a little weight or eating less salt will help.  Checking your blood pressure at home and at different times of the day can help to control blood pressure.  If the doctor prescribes medicine remember to take it the way the doctor ordered.  Call the office if you cannot afford the medicine or if there are questions about it.     Notes:        Patient Care Plan: CCM Pharmacy Care Plan     Problem Identified: HLD HTN OA Stress incontinence GERD MDD   Priority: High     Long-Range Goal: Disease Management   Start Date: 12/12/2020  Expected End Date: 12/12/2021  Recent Progress: On track  Priority: High  Note:       Current Barriers:  UTI  Pharmacist Clinical Goal(s):  Patient will contact provider office for questions/concerns as evidenced notation of same in electronic health record through collaboration with PharmD and provider.   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 No recent falls reported by patient.  -Educated on Symptoms of hypotension and importance of maintaining adequate hydration; -Counseled to monitor BP at home, document, and provide log at future appointments -Recommended to continue current medication  Update 07/15/21 Has not been checking at home lately.  Elevated BP last OV, however, her dog was having an operation that caused her to be worried.  Denies any dizziness or HA's.  Has upcoming appointment with wound doc on 12/15.  They will check at this visit.  Have asked she check once weekly or as able to keep an eye on it.  Discussed BP goals. No changes to medication now, continue to monitor.  01/21/22 Fall back in may she tripped while walking her dog.  Denies any concern with blood pressure at this time. She has not really been monitoring at home. Last few office BP's have been normal, pulse has also been normal. Denies any dizziness. Continue current  meds, no changes at this time.  Urinary Symptoms (UTI/Yeast Infxn) (Goal: Reduce freq, help resolve symptoms) -Controlled -Current treatment  None -Medications previously tried: Diflucan -She was recently treated with ABX and Diflucan.  Most recent urine culture was negative.  Reports Diflucan caused some burning and stinging. She has since completed course and symptoms are improving.  -Recommended we hold off on additional meds while symptoms have improved.  Report returning symptoms and she may need to return for FU urine culture.  Hyperlipidemia: (LDL goal < 100) 04/21/22 -Controlled, most recent LDL 68 -Current treatment: Vytorin 10-20 mg  once daily Appropriate, Effective, Safe, Accessible -Review of side effects - no problems noted -Educated on Cholesterol goals;  -Recommended to continue current medication, continue routine screenings. -Continues to tolerate medication  Update 07/15/21 Continues adherence with meds.  We do not show updated lipids since 2019.  Recommended patient make appointment for fasting labs and physical with Dr. Yong Channel so we can get these routine labs completed. No changes to meds at this time.  Update 01/21/22 LDL in march well controlled on current medication. She mentions copay is affordable and no adverse effects. Continue routine screenings, no changes to medication needed at this time.  Osteopenia (Goal minimize symptoms) -Controlled, not assessed -Last DEXA Scan: 2020 -Patient is not a candidate for pharmacologic treatment -Current treatment  Will start Vitamin D3 1000 units -Medications previously tried:   -Recommend [phone] units of vitamin D daily. Recommend weight-bearing and muscle strengthening exercises for building and maintaining bone density.  MDD (Goal: ensure medication) -Controlled, not assessed -Feels depression has been well controlled, no side effects. Pain management has been reasonable following spinal surgery 2019, knows not to over do it now. Current treatment  Venlafaxine XR 150 mg twice daily  -Reviewed side effects - no problems noted, sleep has been good as long as trazodone 25 mg is being used -Recommended to continue current medication  Patient Goals/Self-Care Activities Patient will:  - target a minimum of 150 minutes of moderate intensity exercise weekly  Follow Up Plan: [place] f/u telephone visit 6-8 months.  Medication Assistance: None required.  Patient affirms current coverage meets needs.         The patient verbalized understanding of instructions, educational materials, and care plan provided today and DECLINED offer to receive copy of patient  instructions, educational materials, and care plan.  Telephone follow up appointment with pharmacy team member scheduled for: 6 months  Edythe Clarity, [place], PharmD Clinical Pharmacist  [facility] [phone]

## 2022-04-22 DIAGNOSIS — M6281 Muscle weakness (generalized): Secondary | ICD-10-CM | POA: Diagnosis not present

## 2022-04-22 DIAGNOSIS — R2689 Other abnormalities of gait and mobility: Secondary | ICD-10-CM | POA: Diagnosis not present

## 2022-04-24 DIAGNOSIS — R41841 Cognitive communication deficit: Secondary | ICD-10-CM | POA: Diagnosis not present

## 2022-04-24 DIAGNOSIS — R488 Other symbolic dysfunctions: Secondary | ICD-10-CM | POA: Diagnosis not present

## 2022-04-25 DIAGNOSIS — R2689 Other abnormalities of gait and mobility: Secondary | ICD-10-CM | POA: Diagnosis not present

## 2022-04-25 DIAGNOSIS — M6281 Muscle weakness (generalized): Secondary | ICD-10-CM | POA: Diagnosis not present

## 2022-05-01 DIAGNOSIS — R2689 Other abnormalities of gait and mobility: Secondary | ICD-10-CM | POA: Diagnosis not present

## 2022-05-01 DIAGNOSIS — M6281 Muscle weakness (generalized): Secondary | ICD-10-CM | POA: Diagnosis not present

## 2022-05-02 DIAGNOSIS — R41841 Cognitive communication deficit: Secondary | ICD-10-CM | POA: Diagnosis not present

## 2022-05-02 DIAGNOSIS — M6281 Muscle weakness (generalized): Secondary | ICD-10-CM | POA: Diagnosis not present

## 2022-05-02 DIAGNOSIS — R488 Other symbolic dysfunctions: Secondary | ICD-10-CM | POA: Diagnosis not present

## 2022-05-02 DIAGNOSIS — R2689 Other abnormalities of gait and mobility: Secondary | ICD-10-CM | POA: Diagnosis not present

## 2022-05-05 ENCOUNTER — Encounter: Payer: Self-pay | Admitting: *Deleted

## 2022-05-05 DIAGNOSIS — R2689 Other abnormalities of gait and mobility: Secondary | ICD-10-CM | POA: Diagnosis not present

## 2022-05-05 DIAGNOSIS — R488 Other symbolic dysfunctions: Secondary | ICD-10-CM | POA: Diagnosis not present

## 2022-05-05 DIAGNOSIS — M6281 Muscle weakness (generalized): Secondary | ICD-10-CM | POA: Diagnosis not present

## 2022-05-05 DIAGNOSIS — R41841 Cognitive communication deficit: Secondary | ICD-10-CM | POA: Diagnosis not present

## 2022-05-06 DIAGNOSIS — R41841 Cognitive communication deficit: Secondary | ICD-10-CM | POA: Diagnosis not present

## 2022-05-06 DIAGNOSIS — R488 Other symbolic dysfunctions: Secondary | ICD-10-CM | POA: Diagnosis not present

## 2022-05-07 DIAGNOSIS — M6281 Muscle weakness (generalized): Secondary | ICD-10-CM | POA: Diagnosis not present

## 2022-05-07 DIAGNOSIS — R2689 Other abnormalities of gait and mobility: Secondary | ICD-10-CM | POA: Diagnosis not present

## 2022-05-09 DIAGNOSIS — R2689 Other abnormalities of gait and mobility: Secondary | ICD-10-CM | POA: Diagnosis not present

## 2022-05-09 DIAGNOSIS — M6281 Muscle weakness (generalized): Secondary | ICD-10-CM | POA: Diagnosis not present

## 2022-05-12 DIAGNOSIS — M6281 Muscle weakness (generalized): Secondary | ICD-10-CM | POA: Diagnosis not present

## 2022-05-12 DIAGNOSIS — R2689 Other abnormalities of gait and mobility: Secondary | ICD-10-CM | POA: Diagnosis not present

## 2022-05-13 DIAGNOSIS — R488 Other symbolic dysfunctions: Secondary | ICD-10-CM | POA: Diagnosis not present

## 2022-05-13 DIAGNOSIS — M6281 Muscle weakness (generalized): Secondary | ICD-10-CM | POA: Diagnosis not present

## 2022-05-13 DIAGNOSIS — R41841 Cognitive communication deficit: Secondary | ICD-10-CM | POA: Diagnosis not present

## 2022-05-13 DIAGNOSIS — R2689 Other abnormalities of gait and mobility: Secondary | ICD-10-CM | POA: Diagnosis not present

## 2022-05-14 DIAGNOSIS — R488 Other symbolic dysfunctions: Secondary | ICD-10-CM | POA: Diagnosis not present

## 2022-05-14 DIAGNOSIS — R41841 Cognitive communication deficit: Secondary | ICD-10-CM | POA: Diagnosis not present

## 2022-05-15 ENCOUNTER — Ambulatory Visit (INDEPENDENT_AMBULATORY_CARE_PROVIDER_SITE_OTHER): Payer: Medicare PPO | Admitting: Family Medicine

## 2022-05-15 ENCOUNTER — Encounter: Payer: Self-pay | Admitting: Family Medicine

## 2022-05-15 VITALS — BP 108/60 | HR 66 | Temp 97.7°F | Ht 61.0 in | Wt 144.2 lb

## 2022-05-15 DIAGNOSIS — R300 Dysuria: Secondary | ICD-10-CM

## 2022-05-15 DIAGNOSIS — Z23 Encounter for immunization: Secondary | ICD-10-CM

## 2022-05-15 DIAGNOSIS — I10 Essential (primary) hypertension: Secondary | ICD-10-CM

## 2022-05-15 DIAGNOSIS — F325 Major depressive disorder, single episode, in full remission: Secondary | ICD-10-CM | POA: Diagnosis not present

## 2022-05-15 DIAGNOSIS — M6281 Muscle weakness (generalized): Secondary | ICD-10-CM | POA: Diagnosis not present

## 2022-05-15 DIAGNOSIS — R2689 Other abnormalities of gait and mobility: Secondary | ICD-10-CM | POA: Diagnosis not present

## 2022-05-15 LAB — URINALYSIS, ROUTINE W REFLEX MICROSCOPIC
Bilirubin Urine: NEGATIVE
Ketones, ur: NEGATIVE
Nitrite: NEGATIVE
Specific Gravity, Urine: 1.015 (ref 1.000–1.030)
Total Protein, Urine: NEGATIVE
Urine Glucose: NEGATIVE
Urobilinogen, UA: 0.2 (ref 0.0–1.0)
pH: 5.5 (ref 5.0–8.0)

## 2022-05-15 MED ORDER — VENLAFAXINE HCL ER 75 MG PO CP24: 75.0000 mg | ORAL_CAPSULE | Freq: Every day | ORAL | 1 refills | Status: DC

## 2022-05-15 MED ORDER — VENLAFAXINE HCL ER 150 MG PO CP24
150.0000 mg | ORAL_CAPSULE | Freq: Every day | ORAL | 3 refills | Status: DC
Start: 2022-05-15 — End: 2022-06-23

## 2022-05-15 NOTE — Patient Instructions (Addendum)
STOP taking venlafaxine XR twice daily '150mg'$  - ONLY take 150 mg XR in the morning -take new prescription of 75 mg in the evening -reevaluate in 11 days and if doing well- we may reduce further  Stop alprazolam- take to police locations we discussed along with hydrocodone  Please stop by lab before you go If you have mychart- we will send your results within 3 business days of [place] receiving them.  If you do not have mychart- we will call you about results within 5 business days of [place] receiving them.  *please also note that you will see labs on mychart as soon as they post. I will later go in and write notes on them- will say "notes from Dr. Yong Channel"   Recommended follow up: Return for next already scheduled visit or sooner if needed.

## 2022-05-15 NOTE — Progress Notes (Signed)
Phone [phone] In person visit   Subjective:   [name] is a 86 y.o. year old very pleasant female patient who presents for/with See problem oriented charting Chief Complaint  Patient presents with   Discuss side effects of Xanax and Effexor    Pt wants to discuss side effects of Xanax and Effexor, pt c/o trembling side effect.   Past Medical History-  Patient Active Problem List   Diagnosis Date Noted   Liver lesion 11/10/2017    Priority: High   Memory changes 03/06/2017    Priority: Medium    Lumbar stenosis with neurogenic claudication 03/20/2016    Priority: Medium    Scalp pain 10/22/2015    Priority: Medium    Macular degeneration of right eye 03/03/2013    Priority: Medium    Glaucoma 03/03/2013    Priority: Medium    Hyperlipidemia 03/14/2009    Priority: Medium    Depression 10/19/2007    Priority: Medium    Essential hypertension 10/19/2007    Priority: Medium    GASTROESOPHAGEAL REFLUX DISEASE 10/19/2007    Priority: Medium    Chest pain 01/24/2018    Priority: Low   Post-traumatic headache 10/11/2015    Priority: Low   Routine health maintenance 03/06/2013    Priority: Low   Osteoarthritis 10/19/2007    Priority: Low   STRESS INCONTINENCE 10/19/2007    Priority: Low   Allergic rhinitis 10/19/2007    Priority: Low   Major depressive disorder with single episode, in full remission ([place]) 10/22/2021   Aortic atherosclerosis ([place]) 01/17/2021   Osteopenia of neck of right femur 03/29/2019   Nausea & vomiting 10/31/2017   Chronic daily headache 07/08/2017    Medications- reviewed and updated Current Outpatient Medications  Medication Sig Dispense Refill   acetaminophen (TYLENOL) 500 MG tablet Take 1,000 mg by mouth every 6 (six) hours as needed for moderate pain or headache.     ALPRAZolam (XANAX) 0.5 MG tablet TAKE 1/2 TO 1 TABLET(0.25 TO 0.5 MG) BY MOUTH TWICE DAILY AS NEEDED FOR ANXIETY (Patient taking differently: Take 0.5 mg by mouth 2  (two) times daily as needed for anxiety.) 20 tablet 0   aspirin 81 MG tablet Take 81 mg by mouth daily.     brimonidine-timolol (COMBIGAN) 0.2-0.5 % ophthalmic solution Place 1 drop into both eyes every 12 (twelve) hours.     clobetasol ointment (TEMOVATE) 0.05 % apply to affected area twice a day if needed DO NOT USE FOR MORE THAN 5 DAYS 30 g 0   ezetimibe-simvastatin (VYTORIN) 10-20 MG tablet TAKE 1 TABLET BY MOUTH EVERY DAY 90 tablet 3   fluconazole (DIFLUCAN) 150 MG tablet Take 1 tablet (150 mg total) by mouth every three (3) days as needed. 2 tablet 0   Multiple Vitamins-Minerals (MULTIVITAMIN ADULT) CHEW Chew 2 each by mouth daily. Nature Made for Her     nadolol (CORGARD) 40 MG tablet TAKE 1 TABLET BY MOUTH AT BEDTIME 90 tablet 2   omeprazole (PRILOSEC) 40 MG capsule TAKE 1 CAPSULE(40 MG) BY MOUTH DAILY (Patient taking differently: Take 40 mg by mouth daily.) 90 capsule 0   ondansetron (ZOFRAN) 4 MG tablet Take 1 tablet (4 mg total) by mouth every 6 (six) hours. 12 tablet 0   sodium chloride (OCEAN) 0.65 % SOLN nasal spray Place 1 spray into both nostrils 2 (two) times daily as needed for congestion.     traZODone (DESYREL) 100 MG tablet TAKE 1/4 TO 1/2 TABLET BY MOUTH  AT BEDTIME IF NEEDED FOR SLEEP. 45 tablet 3   venlafaxine XR (EFFEXOR-XR) 150 MG 24 hr capsule TAKE 1 CAPSULE BY MOUTH TWICE A DAY 180 capsule 3   Zinc Oxide (DESITIN) 13 % CREA Apply 1 application topically daily as needed (itching).     No current facility-administered medications for this visit.     Objective:  BP 108/60   Pulse 66   Temp 97.7 F (36.5 C)   Ht '5\' 1"'$  (1.549 m)   Wt 144 lb 3.2 oz (65.4 kg)   LMP 08/11/1974   SpO2 95%   BMI 27.25 kg/m  Gen: NAD, resting comfortably CV: RRR no murmurs rubs or gallops Lungs: CTAB no crackles, wheeze, rhonchi Ext: Trace edema Skin: warm, dry Neuro: walks with walker    Assessment and Plan   # Depression/anxiety/stress S: Medication:venlafaxine 150 mg XR  twice a day - has a very caring friend also RN who has noted tremulousness plus issues with dizziness/falling/stability on feet and they jointly ask about reducing medicine, breaking trazodone '100mg'$  into 4ths and really helps with sleep -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 No anhedonia or depressed mood. No SI.  A/P: Patient and her friend (retired Therapist, sports) are worried about long-term side effects of medication and want to reduce risks as far as imbalance/tremulousness especially since she seems to be coping reasonably well after loss of husband with depression and anxiety and stress-full remission of depression - Discussed high risk prior to use aluminating alprazolam-she is in agreement to stop - She is concerned at her age that Effexor may be too strong-150 mg twice daily since I have known her-we opted to decrease 150 mg in the morning and 75 mg in the evening of extended release with hopes to eliminate 75 mg dose at next visit -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  Complains of dysuria: YES; polyuria: not in daytime; nocturia: yes; urgency: yes. Odor noted. Incontinence worse  ROS- no fever, chills, nausea, vomiting, flank pain. No blood in urine.  A/P: UA concerning for UTI. Will get culture to determine appropriate treatment and will certainly need feedback if she has clearance of symptoms since reports symptoms did not clear from July when  she was treated Patient to follow up if new or worsening symptoms or failure to improve.    #some chest pains around time of husbands death mentioned at the end of the visit-she has not had any recent chest pain-agrees if recurrent symptoms to seek care-she wants to discuss possible EKG at follow-up visit within 2 weeks-reasonable to update baseline but will need to discuss does not firmly rule out cardiac disease  Recommended follow up: Return for next already scheduled visit or sooner if needed. Future Appointments  Date Time Provider [place]  05/26/2022  8:00 AM Marin Olp, MD [facility]  06/24/2022  9:20 AM Marin Olp, MD [facility]  10/21/2022  2:00 PM [facility] PHARMACIST [facility]  Lab/Order associations:   ICD-10-CM   1. Major depressive disorder with single episode, in full remission ([place])  F32.5     2. Essential hypertension  I10     3. Need for immunization against influenza  Z23 Flu Vaccine QUAD High Dose(Fluad)    4. Dysuria  R30.0 Urinalysis, Routine w reflex microscopic    Urine Culture      Meds ordered this encounter  Medications   venlafaxine XR (EFFEXOR XR) 75 MG 24 hr capsule    Sig: Take 1 capsule (75 mg total) by mouth at bedtime. Take 150 mg XR in the AM and then this pill in the PM.    Dispense:  30 capsule    Refill:  1   venlafaxine XR (EFFEXOR-XR) 150 MG 24 hr capsule    Sig: Take 1 capsule (150 mg total) by mouth daily with breakfast. Take this in the morning then 75 mg in the evening- we will reevaluate and try to get down to just 150 mg in the morning in the long run    Dispense:  180 capsule    Refill:  3    Return precautions advised.  Garret Reddish, MD

## 2022-05-16 DIAGNOSIS — R488 Other symbolic dysfunctions: Secondary | ICD-10-CM | POA: Diagnosis not present

## 2022-05-16 DIAGNOSIS — R41841 Cognitive communication deficit: Secondary | ICD-10-CM | POA: Diagnosis not present

## 2022-05-18 LAB — URINE CULTURE
MICRO NUMBER:: 14012972
SPECIMEN QUALITY:: ADEQUATE

## 2022-05-19 ENCOUNTER — Telehealth: Payer: Self-pay | Admitting: Family Medicine

## 2022-05-19 ENCOUNTER — Other Ambulatory Visit: Payer: Self-pay | Admitting: Family Medicine

## 2022-05-19 DIAGNOSIS — R2689 Other abnormalities of gait and mobility: Secondary | ICD-10-CM | POA: Diagnosis not present

## 2022-05-19 DIAGNOSIS — M6281 Muscle weakness (generalized): Secondary | ICD-10-CM | POA: Diagnosis not present

## 2022-05-19 DIAGNOSIS — R488 Other symbolic dysfunctions: Secondary | ICD-10-CM | POA: Diagnosis not present

## 2022-05-19 DIAGNOSIS — R41841 Cognitive communication deficit: Secondary | ICD-10-CM | POA: Diagnosis not present

## 2022-05-19 MED ORDER — SULFAMETHOXAZOLE-TRIMETHOPRIM 800-160 MG PO TABS: 1.0000 | ORAL_TABLET | Freq: Two times a day (BID) | ORAL | 0 refills | Status: DC

## 2022-05-19 NOTE — Telephone Encounter (Signed)
Patient states:  - She is wanting for either PCP or PCP team to inform her of her urine results from 10/06

## 2022-05-19 NOTE — Telephone Encounter (Signed)
Pt returned a call to our office and would like a call back.

## 2022-05-19 NOTE — Telephone Encounter (Signed)
Called and spoke with pt and results reviewed. 

## 2022-05-20 DIAGNOSIS — M6281 Muscle weakness (generalized): Secondary | ICD-10-CM | POA: Diagnosis not present

## 2022-05-20 DIAGNOSIS — R2689 Other abnormalities of gait and mobility: Secondary | ICD-10-CM | POA: Diagnosis not present

## 2022-05-21 DIAGNOSIS — R41841 Cognitive communication deficit: Secondary | ICD-10-CM | POA: Diagnosis not present

## 2022-05-21 DIAGNOSIS — R488 Other symbolic dysfunctions: Secondary | ICD-10-CM | POA: Diagnosis not present

## 2022-05-22 DIAGNOSIS — M6281 Muscle weakness (generalized): Secondary | ICD-10-CM | POA: Diagnosis not present

## 2022-05-22 DIAGNOSIS — R2689 Other abnormalities of gait and mobility: Secondary | ICD-10-CM | POA: Diagnosis not present

## 2022-05-23 DIAGNOSIS — R41841 Cognitive communication deficit: Secondary | ICD-10-CM | POA: Diagnosis not present

## 2022-05-23 DIAGNOSIS — R488 Other symbolic dysfunctions: Secondary | ICD-10-CM | POA: Diagnosis not present

## 2022-05-26 ENCOUNTER — Encounter: Payer: Medicare PPO | Admitting: Family Medicine

## 2022-05-26 ENCOUNTER — Telehealth: Payer: Self-pay | Admitting: Family Medicine

## 2022-05-26 DIAGNOSIS — R488 Other symbolic dysfunctions: Secondary | ICD-10-CM | POA: Diagnosis not present

## 2022-05-26 DIAGNOSIS — M6281 Muscle weakness (generalized): Secondary | ICD-10-CM | POA: Diagnosis not present

## 2022-05-26 DIAGNOSIS — R41841 Cognitive communication deficit: Secondary | ICD-10-CM | POA: Diagnosis not present

## 2022-05-26 DIAGNOSIS — R2689 Other abnormalities of gait and mobility: Secondary | ICD-10-CM | POA: Diagnosis not present

## 2022-05-26 NOTE — Telephone Encounter (Signed)
Pt states: -Caller ID shows missed call, was returning call.   Confirmed next appointment with patient and was satisfied.  Please return call, as needed.

## 2022-05-27 DIAGNOSIS — R41841 Cognitive communication deficit: Secondary | ICD-10-CM | POA: Diagnosis not present

## 2022-05-27 DIAGNOSIS — R488 Other symbolic dysfunctions: Secondary | ICD-10-CM | POA: Diagnosis not present

## 2022-05-28 DIAGNOSIS — R2689 Other abnormalities of gait and mobility: Secondary | ICD-10-CM | POA: Diagnosis not present

## 2022-05-28 DIAGNOSIS — M6281 Muscle weakness (generalized): Secondary | ICD-10-CM | POA: Diagnosis not present

## 2022-05-29 DIAGNOSIS — R2689 Other abnormalities of gait and mobility: Secondary | ICD-10-CM | POA: Diagnosis not present

## 2022-05-29 DIAGNOSIS — M6281 Muscle weakness (generalized): Secondary | ICD-10-CM | POA: Diagnosis not present

## 2022-05-30 DIAGNOSIS — R2689 Other abnormalities of gait and mobility: Secondary | ICD-10-CM | POA: Diagnosis not present

## 2022-05-30 DIAGNOSIS — M6281 Muscle weakness (generalized): Secondary | ICD-10-CM | POA: Diagnosis not present

## 2022-05-30 DIAGNOSIS — R41841 Cognitive communication deficit: Secondary | ICD-10-CM | POA: Diagnosis not present

## 2022-05-30 DIAGNOSIS — R488 Other symbolic dysfunctions: Secondary | ICD-10-CM | POA: Diagnosis not present

## 2022-06-02 DIAGNOSIS — R2689 Other abnormalities of gait and mobility: Secondary | ICD-10-CM | POA: Diagnosis not present

## 2022-06-02 DIAGNOSIS — M6281 Muscle weakness (generalized): Secondary | ICD-10-CM | POA: Diagnosis not present

## 2022-06-04 DIAGNOSIS — M6281 Muscle weakness (generalized): Secondary | ICD-10-CM | POA: Diagnosis not present

## 2022-06-04 DIAGNOSIS — R2689 Other abnormalities of gait and mobility: Secondary | ICD-10-CM | POA: Diagnosis not present

## 2022-06-05 DIAGNOSIS — R2689 Other abnormalities of gait and mobility: Secondary | ICD-10-CM | POA: Diagnosis not present

## 2022-06-05 DIAGNOSIS — R488 Other symbolic dysfunctions: Secondary | ICD-10-CM | POA: Diagnosis not present

## 2022-06-05 DIAGNOSIS — M6281 Muscle weakness (generalized): Secondary | ICD-10-CM | POA: Diagnosis not present

## 2022-06-05 DIAGNOSIS — R41841 Cognitive communication deficit: Secondary | ICD-10-CM | POA: Diagnosis not present

## 2022-06-06 DIAGNOSIS — R41841 Cognitive communication deficit: Secondary | ICD-10-CM | POA: Diagnosis not present

## 2022-06-06 DIAGNOSIS — R488 Other symbolic dysfunctions: Secondary | ICD-10-CM | POA: Diagnosis not present

## 2022-06-09 ENCOUNTER — Ambulatory Visit: Payer: Medicare PPO | Admitting: Family Medicine

## 2022-06-09 DIAGNOSIS — R2689 Other abnormalities of gait and mobility: Secondary | ICD-10-CM | POA: Diagnosis not present

## 2022-06-09 DIAGNOSIS — H52203 Unspecified astigmatism, bilateral: Secondary | ICD-10-CM | POA: Diagnosis not present

## 2022-06-09 DIAGNOSIS — H401132 Primary open-angle glaucoma, bilateral, moderate stage: Secondary | ICD-10-CM | POA: Diagnosis not present

## 2022-06-09 DIAGNOSIS — M6281 Muscle weakness (generalized): Secondary | ICD-10-CM | POA: Diagnosis not present

## 2022-06-10 DIAGNOSIS — M6281 Muscle weakness (generalized): Secondary | ICD-10-CM | POA: Diagnosis not present

## 2022-06-10 DIAGNOSIS — R2689 Other abnormalities of gait and mobility: Secondary | ICD-10-CM | POA: Diagnosis not present

## 2022-06-11 DIAGNOSIS — R488 Other symbolic dysfunctions: Secondary | ICD-10-CM | POA: Diagnosis not present

## 2022-06-11 DIAGNOSIS — R41841 Cognitive communication deficit: Secondary | ICD-10-CM | POA: Diagnosis not present

## 2022-06-12 ENCOUNTER — Other Ambulatory Visit: Payer: Self-pay | Admitting: Family Medicine

## 2022-06-12 DIAGNOSIS — M6281 Muscle weakness (generalized): Secondary | ICD-10-CM | POA: Diagnosis not present

## 2022-06-12 DIAGNOSIS — R2689 Other abnormalities of gait and mobility: Secondary | ICD-10-CM | POA: Diagnosis not present

## 2022-06-13 DIAGNOSIS — R488 Other symbolic dysfunctions: Secondary | ICD-10-CM | POA: Diagnosis not present

## 2022-06-13 DIAGNOSIS — R41841 Cognitive communication deficit: Secondary | ICD-10-CM | POA: Diagnosis not present

## 2022-06-16 DIAGNOSIS — R41841 Cognitive communication deficit: Secondary | ICD-10-CM | POA: Diagnosis not present

## 2022-06-16 DIAGNOSIS — R488 Other symbolic dysfunctions: Secondary | ICD-10-CM | POA: Diagnosis not present

## 2022-06-17 DIAGNOSIS — R488 Other symbolic dysfunctions: Secondary | ICD-10-CM | POA: Diagnosis not present

## 2022-06-17 DIAGNOSIS — R2689 Other abnormalities of gait and mobility: Secondary | ICD-10-CM | POA: Diagnosis not present

## 2022-06-17 DIAGNOSIS — R41841 Cognitive communication deficit: Secondary | ICD-10-CM | POA: Diagnosis not present

## 2022-06-17 DIAGNOSIS — M6281 Muscle weakness (generalized): Secondary | ICD-10-CM | POA: Diagnosis not present

## 2022-06-18 ENCOUNTER — Telehealth: Payer: Self-pay | Admitting: Family Medicine

## 2022-06-18 DIAGNOSIS — R2689 Other abnormalities of gait and mobility: Secondary | ICD-10-CM | POA: Diagnosis not present

## 2022-06-18 DIAGNOSIS — M6281 Muscle weakness (generalized): Secondary | ICD-10-CM | POA: Diagnosis not present

## 2022-06-18 NOTE — Telephone Encounter (Signed)
FYI

## 2022-06-18 NOTE — Telephone Encounter (Signed)
Patient states she does not like the way she feels taking Effexor XR 750 MG.  Patient states she wants to go back to taking the 150 MG and requests new RX for:  venlafaxine XR (EFFEXOR-XR) 150 MG 24 hr capsule  Be sent to:  [id number] [address] [phone] Fax: [phone]

## 2022-06-18 NOTE — Telephone Encounter (Signed)
The plan was for her to take the Effexor 150 mg in the morning and 75 mg in the evening-is that what she is currently taking?  That was what we discussed at last visit (reduction from 150 mg twice daily)

## 2022-06-19 NOTE — Telephone Encounter (Signed)
Called and lm for pt tcb. 

## 2022-06-20 DIAGNOSIS — R2689 Other abnormalities of gait and mobility: Secondary | ICD-10-CM | POA: Diagnosis not present

## 2022-06-20 DIAGNOSIS — R41841 Cognitive communication deficit: Secondary | ICD-10-CM | POA: Diagnosis not present

## 2022-06-20 DIAGNOSIS — R488 Other symbolic dysfunctions: Secondary | ICD-10-CM | POA: Diagnosis not present

## 2022-06-20 DIAGNOSIS — M6281 Muscle weakness (generalized): Secondary | ICD-10-CM | POA: Diagnosis not present

## 2022-06-20 NOTE — Telephone Encounter (Signed)
Pt returned call and states she has been taking the '75mg'$  in the morning and the '150mg'$  in the evening but she states she does not think this is working she almost feels like she needs the full '300mg'$  again.

## 2022-06-20 NOTE — Telephone Encounter (Signed)
Called and spoke with pt and message given, pt will update [place] next week.

## 2022-06-20 NOTE — Telephone Encounter (Signed)
Called and lm on pt vm tcb. 

## 2022-06-20 NOTE — Telephone Encounter (Signed)
Can you have her try taking 2 in the morning and 1 in the evening instead?  She has a visit on the 13th but I would like to see how she does with this change

## 2022-06-20 NOTE — Telephone Encounter (Signed)
Pt returned a call to the office

## 2022-06-23 ENCOUNTER — Encounter: Payer: Self-pay | Admitting: Family Medicine

## 2022-06-23 ENCOUNTER — Ambulatory Visit (INDEPENDENT_AMBULATORY_CARE_PROVIDER_SITE_OTHER): Payer: Medicare PPO | Admitting: Family Medicine

## 2022-06-23 VITALS — BP 129/76 | HR 67 | Temp 97.1°F | Ht 61.0 in | Wt 149.6 lb

## 2022-06-23 DIAGNOSIS — F325 Major depressive disorder, single episode, in full remission: Secondary | ICD-10-CM | POA: Diagnosis not present

## 2022-06-23 DIAGNOSIS — R300 Dysuria: Secondary | ICD-10-CM | POA: Diagnosis not present

## 2022-06-23 DIAGNOSIS — R488 Other symbolic dysfunctions: Secondary | ICD-10-CM | POA: Diagnosis not present

## 2022-06-23 DIAGNOSIS — I10 Essential (primary) hypertension: Secondary | ICD-10-CM | POA: Diagnosis not present

## 2022-06-23 DIAGNOSIS — R41841 Cognitive communication deficit: Secondary | ICD-10-CM | POA: Diagnosis not present

## 2022-06-23 LAB — POCT URINALYSIS DIPSTICK
Bilirubin, UA: NEGATIVE
Blood, UA: NEGATIVE
Clarity, UA: NEGATIVE
Glucose, UA: NEGATIVE
Ketones, UA: NEGATIVE
Nitrite, UA: NEGATIVE
Protein, UA: NEGATIVE
Spec Grav, UA: 1.02 (ref 1.010–1.025)
Urobilinogen, UA: 0.2 E.U./dL
pH, UA: 5 (ref 5.0–8.0)

## 2022-06-23 MED ORDER — VENLAFAXINE HCL ER 150 MG PO CP24: 150.0000 mg | ORAL_CAPSULE | Freq: Two times a day (BID) | ORAL | 3 refills | Status: DC

## 2022-06-23 NOTE — Addendum Note (Signed)
Addended by: Augustina Mood on: 06/23/2022 03:28 PM   Modules accepted: Orders

## 2022-06-23 NOTE — Patient Instructions (Addendum)
Team please call her pharmacy and get dates of  final shingrix shot  Pleaes abstract - covid shot listed 06/11/22 but she received on 06/13/22 with [employer] through [employer] are eligible to schedule your annual wellness visit with our nurse specialist Otila Kluver.  Please consider scheduling this before you leave today  depression seems controlled. Had tried to reduce dose of Effexor and evne though over a month worth of trial- continued to have increased tearfulness/crying spell-s she requests to go back to prior dose and we went back to 150 mg XR twice daily today  Possible urinary tract infection- we are getting a culture to make sure  Please stop by lab before you go If you have mychart- we will send your results within 3 business days of [place] receiving them.  If you do not have mychart- we will call you about results within 5 business days of [place] receiving them.  *please also note that you will see labs on mychart as soon as they post. I will later go in and write notes on them- will say "notes from Dr. Yong Channel"   Recommended follow up: Return for next already scheduled visit or sooner if needed.

## 2022-06-23 NOTE — Progress Notes (Signed)
Phone [phone] In person visit   Subjective:   [name] is a 86 y.o. year old very pleasant female patient who presents for/with See problem oriented charting Chief Complaint  Patient presents with   Follow-up    4 month follow up and UTI check    Past Medical History-  Patient Active Problem List   Diagnosis Date Noted   Liver lesion 11/10/2017    Priority: High   Memory changes 03/06/2017    Priority: Medium    Lumbar stenosis with neurogenic claudication 03/20/2016    Priority: Medium    Scalp pain 10/22/2015    Priority: Medium    Macular degeneration of right eye 03/03/2013    Priority: Medium    Glaucoma 03/03/2013    Priority: Medium    Hyperlipidemia 03/14/2009    Priority: Medium    Depression 10/19/2007    Priority: Medium    Essential hypertension 10/19/2007    Priority: Medium    GASTROESOPHAGEAL REFLUX DISEASE 10/19/2007    Priority: Medium    Chest pain 01/24/2018    Priority: Low   Post-traumatic headache 10/11/2015    Priority: Low   Routine health maintenance 03/06/2013    Priority: Low   Osteoarthritis 10/19/2007    Priority: Low   STRESS INCONTINENCE 10/19/2007    Priority: Low   Allergic rhinitis 10/19/2007    Priority: Low   Major depressive disorder with single episode, in full remission ([place]) 10/22/2021   Aortic atherosclerosis ([place]) 01/17/2021   Osteopenia of neck of right femur 03/29/2019   Nausea & vomiting 10/31/2017   Chronic daily headache 07/08/2017    Medications- reviewed and updated Current Outpatient Medications  Medication Sig Dispense Refill   aspirin EC 81 MG tablet Take 81 mg by mouth daily. Swallow whole. Dr. Yong Channel has recommended stopping- you have wanted to continue.     brimonidine-timolol (COMBIGAN) 0.2-0.5 % ophthalmic solution Place 1 drop into both eyes every 12 (twelve) hours.     ezetimibe-simvastatin (VYTORIN) 10-20 MG tablet TAKE 1 TABLET BY MOUTH EVERY DAY 90 tablet 3   Multiple  Vitamins-Minerals (MULTIVITAMIN ADULT) CHEW Chew 2 each by mouth daily. Nature Made for Her     nadolol (CORGARD) 40 MG tablet TAKE 1 TABLET BY MOUTH AT BEDTIME 90 tablet 2   omeprazole (PRILOSEC) 20 MG capsule Take 20 mg by mouth daily. In the morning for acid reflux over the counter     SPIKEVAX 50 MCG/0.5ML SUSP Inject 50 mcg into the muscle as directed.     traZODone (DESYREL) 100 MG tablet TAKE 1/4 TO 1/2 TABLET BY MOUTH AT BEDTIME IF NEEDED FOR SLEEP. 45 tablet 3   venlafaxine XR (EFFEXOR-XR) 150 MG 24 hr capsule Take 1 capsule (150 mg total) by mouth in the morning and at bedtime. 180 capsule 3   No current facility-administered medications for this visit.     Objective:  BP 129/76 (BP Location: Left Arm, Patient Position: Sitting, Cuff Size: Normal)   Pulse 67   Temp (!) 97.1 F (36.2 C) (Temporal)   Ht '5\' 1"'$  (1.549 m)   Wt 149 lb 9.6 oz (67.9 kg)   LMP 08/11/1974   SpO2 98%   BMI 28.27 kg/m  Gen: NAD, resting comfortably CV: RRR no murmurs rubs or gallops Lungs: CTAB no crackles, wheeze, rhonchi Ext: minimal edema Skin: warm, dry Neuro: walks with walker, hard of hearing    Assessment and Plan   # Depression S: Medication:effexor 150 mg XR in  AM and 75 mg in PM was trialed after last visit- she noted being a lot more tearful on this lower dose and just feeling off - she decided after running out of 75 mg tablets to just go back to 150 mg XR AM and PM since about Friday and has felt much better- prefers to be back on the higher dose.  - alprazolam was stopped last visit- remains off of that.   -trazodone still helps with sleep- 1/4 tablet only    06/23/2022    3:02 PM 06/23/2022    2:32 PM 03/24/2022    2:45 PM  Depression screen PHQ 2/9  Decreased Interest 0 0 0  Down, Depressed, Hopeless 0 0 0  PHQ - 2 Score 0 0 0  Altered sleeping 0  0  Tired, decreased energy 0  0  Change in appetite 1  0  Feeling bad or failure about yourself  0  0  Trouble concentrating 0   0  Moving slowly or fidgety/restless 0  0  Suicidal thoughts 0  0  PHQ-9 Score 1  0  Difficult doing work/chores Not difficult at all  Not difficult at all   A/P: depression seems controlled. Had tried to reduce dose of Effexor and evne though over a month worth of trial- continued to have increased tearfulness/crying spell-s she requests to go back to prior dose and we went back to 150 mg XR twice daily today -some risk of serotonin syndrome- discussed with her but prefers to continue  #hypertension S: medication: nadolol 40 mg BP Readings from Last 3 Encounters:  06/23/22 129/76  05/15/22 108/60  04/03/22 (!) 150/72  A/P: Controlled. Continue current medications.   #Concern for UTI S: Patients symptoms started with symptoms a week ago.  Complains of dysuria: yes and lower abdominal pain with peeing; polyuria: yes; nocturia: not sure- wears diapers; urgency: not sure; does have incontinence- pads in daytime Symptoms are worsening.  ROS- no fever, chills, nausea, vomiting, flank pain. No blood in urine.  A/P: UA with leukocytes only. Possible UTI. Will get culture. Empiric treatment - we opted to hold off Patient to follow up if new or worsening symptoms or failure to improve.   Recommended follow up: Return for next already scheduled visit or sooner if needed. Future Appointments  Date Time Provider [place]  10/14/2022  1:20 PM Marin Olp, MD [facility]  10/21/2022  2:00 PM [facility] PHARMACIST [facility]   Lab/Order associations:   ICD-10-CM   1. Dysuria  R30.0 Urine Culture    2. Major depressive disorder with single episode, in full remission ([place])  F32.5     3. Essential hypertension  I10 CBC with Differential/Platelet    Comprehensive metabolic panel     Meds ordered this encounter  Medications   venlafaxine XR (EFFEXOR-XR) 150 MG 24 hr capsule    Sig: Take 1 capsule (150 mg total) by mouth in the morning and at bedtime.    Dispense:  180  capsule    Refill:  3    Stop the 75 mg in the pm dose- she had tried this but had increased crying spells- going back to '150mg'$  XR twice daily   Return precautions advised.  Garret Reddish, MD

## 2022-06-24 ENCOUNTER — Ambulatory Visit: Payer: Medicare PPO | Admitting: Family Medicine

## 2022-06-24 DIAGNOSIS — M6281 Muscle weakness (generalized): Secondary | ICD-10-CM | POA: Diagnosis not present

## 2022-06-24 DIAGNOSIS — R2689 Other abnormalities of gait and mobility: Secondary | ICD-10-CM | POA: Diagnosis not present

## 2022-06-24 DIAGNOSIS — R41841 Cognitive communication deficit: Secondary | ICD-10-CM | POA: Diagnosis not present

## 2022-06-24 DIAGNOSIS — R488 Other symbolic dysfunctions: Secondary | ICD-10-CM | POA: Diagnosis not present

## 2022-06-24 LAB — CBC WITH DIFFERENTIAL/PLATELET
Basophils Absolute: 0.1 10*3/uL (ref 0.0–0.1)
Basophils Relative: 1 % (ref 0.0–3.0)
Eosinophils Absolute: 0 10*3/uL (ref 0.0–0.7)
Eosinophils Relative: 0.6 % (ref 0.0–5.0)
HCT: 40.7 % (ref 36.0–46.0)
Hemoglobin: 13.4 g/dL (ref 12.0–15.0)
Lymphocytes Relative: 29.8 % (ref 12.0–46.0)
Lymphs Abs: 2 10*3/uL (ref 0.7–4.0)
MCHC: 32.9 g/dL (ref 30.0–36.0)
MCV: 94.9 fl (ref 78.0–100.0)
Monocytes Absolute: 0.5 10*3/uL (ref 0.1–1.0)
Monocytes Relative: 7.5 % (ref 3.0–12.0)
Neutro Abs: 4.1 10*3/uL (ref 1.4–7.7)
Neutrophils Relative %: 61.1 % (ref 43.0–77.0)
Platelets: 234 10*3/uL (ref 150.0–400.0)
RBC: 4.28 Mil/uL (ref 3.87–5.11)
RDW: 13.7 % (ref 11.5–15.5)
WBC: 6.8 10*3/uL (ref 4.0–10.5)

## 2022-06-24 LAB — COMPREHENSIVE METABOLIC PANEL
ALT: 9 U/L (ref 0–35)
AST: 14 U/L (ref 0–37)
Albumin: 4.1 g/dL (ref 3.5–5.2)
Alkaline Phosphatase: 61 U/L (ref 39–117)
BUN: 12 mg/dL (ref 6–23)
CO2: 32 mEq/L (ref 19–32)
Calcium: 9.2 mg/dL (ref 8.4–10.5)
Chloride: 104 mEq/L (ref 96–112)
Creatinine, Ser: 0.73 mg/dL (ref 0.40–1.20)
GFR: 75.37 mL/min (ref >60.00)
Glucose, Bld: 76 mg/dL (ref 70–99)
Potassium: 4.2 mEq/L (ref 3.5–5.1)
Sodium: 141 mEq/L (ref 135–145)
Total Bilirubin: 0.3 mg/dL (ref 0.2–1.2)
Total Protein: 6.7 g/dL (ref 6.0–8.3)

## 2022-06-24 LAB — URINE CULTURE
MICRO NUMBER:: 14180538
Result:: NO GROWTH
SPECIMEN QUALITY:: ADEQUATE

## 2022-06-25 DIAGNOSIS — R488 Other symbolic dysfunctions: Secondary | ICD-10-CM | POA: Diagnosis not present

## 2022-06-25 DIAGNOSIS — R41841 Cognitive communication deficit: Secondary | ICD-10-CM | POA: Diagnosis not present

## 2022-06-26 ENCOUNTER — Telehealth: Payer: Self-pay | Admitting: Family Medicine

## 2022-06-26 DIAGNOSIS — M6281 Muscle weakness (generalized): Secondary | ICD-10-CM | POA: Diagnosis not present

## 2022-06-26 DIAGNOSIS — R300 Dysuria: Secondary | ICD-10-CM

## 2022-06-26 DIAGNOSIS — R2689 Other abnormalities of gait and mobility: Secondary | ICD-10-CM | POA: Diagnosis not present

## 2022-06-26 NOTE — Telephone Encounter (Signed)
Called and spoke with pt and advised her of culture results. Pt states she hurts so bad inside "down there" so she knows its got to be an UTI and would like something to ease this for her.

## 2022-06-26 NOTE — Telephone Encounter (Signed)
You can we collect another urine culture under dysuria.  There is no current indication for antibiotic-she could take Tylenol for pain  I know this is not a timely question- but just want to make sure no vaginal discharge or anything as well

## 2022-06-26 NOTE — Telephone Encounter (Signed)
Pt States: -she has UTI -she is calling to see if prescription has been sent to pharmacy.   Pt requests: -return call.   [id number] [place] [id number]: [phone] Fax: [phone] #: [id number]

## 2022-06-27 DIAGNOSIS — M6281 Muscle weakness (generalized): Secondary | ICD-10-CM | POA: Diagnosis not present

## 2022-06-27 DIAGNOSIS — R2689 Other abnormalities of gait and mobility: Secondary | ICD-10-CM | POA: Diagnosis not present

## 2022-06-27 DIAGNOSIS — R488 Other symbolic dysfunctions: Secondary | ICD-10-CM | POA: Diagnosis not present

## 2022-06-27 DIAGNOSIS — R41841 Cognitive communication deficit: Secondary | ICD-10-CM | POA: Diagnosis not present

## 2022-06-27 NOTE — Telephone Encounter (Signed)
Spoke to pt told her Dr. Yong Channel, said, You can we collect another urine culture under dysuria.  There is no current indication for antibiotic-she could take Tylenol for pain Pt verbalized understanding. Asked pt if she is having any vaginal discharge or anything else? Pt said no. Told her okay, if you could come in today and give a urine sample we will send off for  culture. Pt verbalized understanding and said she has to see if she can get a ride will call back to schedule lab appt only. Told her okay. Orders placed in Epic.

## 2022-06-27 NOTE — Addendum Note (Signed)
Addended by: Marian Sorrow on: 06/27/2022 09:19 AM   Modules accepted: Orders

## 2022-06-30 DIAGNOSIS — R41841 Cognitive communication deficit: Secondary | ICD-10-CM | POA: Diagnosis not present

## 2022-06-30 DIAGNOSIS — M6281 Muscle weakness (generalized): Secondary | ICD-10-CM | POA: Diagnosis not present

## 2022-06-30 DIAGNOSIS — R488 Other symbolic dysfunctions: Secondary | ICD-10-CM | POA: Diagnosis not present

## 2022-06-30 DIAGNOSIS — R2689 Other abnormalities of gait and mobility: Secondary | ICD-10-CM | POA: Diagnosis not present

## 2022-07-02 DIAGNOSIS — R41841 Cognitive communication deficit: Secondary | ICD-10-CM | POA: Diagnosis not present

## 2022-07-02 DIAGNOSIS — R488 Other symbolic dysfunctions: Secondary | ICD-10-CM | POA: Diagnosis not present

## 2022-07-04 DIAGNOSIS — R2689 Other abnormalities of gait and mobility: Secondary | ICD-10-CM | POA: Diagnosis not present

## 2022-07-04 DIAGNOSIS — R41841 Cognitive communication deficit: Secondary | ICD-10-CM | POA: Diagnosis not present

## 2022-07-04 DIAGNOSIS — M6281 Muscle weakness (generalized): Secondary | ICD-10-CM | POA: Diagnosis not present

## 2022-07-04 DIAGNOSIS — R488 Other symbolic dysfunctions: Secondary | ICD-10-CM | POA: Diagnosis not present

## 2022-07-07 DIAGNOSIS — R2689 Other abnormalities of gait and mobility: Secondary | ICD-10-CM | POA: Diagnosis not present

## 2022-07-07 DIAGNOSIS — R41841 Cognitive communication deficit: Secondary | ICD-10-CM | POA: Diagnosis not present

## 2022-07-07 DIAGNOSIS — M6281 Muscle weakness (generalized): Secondary | ICD-10-CM | POA: Diagnosis not present

## 2022-07-07 DIAGNOSIS — R488 Other symbolic dysfunctions: Secondary | ICD-10-CM | POA: Diagnosis not present

## 2022-07-08 DIAGNOSIS — R2689 Other abnormalities of gait and mobility: Secondary | ICD-10-CM | POA: Diagnosis not present

## 2022-07-08 DIAGNOSIS — R41841 Cognitive communication deficit: Secondary | ICD-10-CM | POA: Diagnosis not present

## 2022-07-08 DIAGNOSIS — M6281 Muscle weakness (generalized): Secondary | ICD-10-CM | POA: Diagnosis not present

## 2022-07-08 DIAGNOSIS — R488 Other symbolic dysfunctions: Secondary | ICD-10-CM | POA: Diagnosis not present

## 2022-07-09 ENCOUNTER — Ambulatory Visit: Payer: Medicare PPO | Admitting: Physician Assistant

## 2022-07-09 DIAGNOSIS — R2689 Other abnormalities of gait and mobility: Secondary | ICD-10-CM | POA: Diagnosis not present

## 2022-07-09 DIAGNOSIS — M6281 Muscle weakness (generalized): Secondary | ICD-10-CM | POA: Diagnosis not present

## 2022-07-10 ENCOUNTER — Encounter: Payer: Self-pay | Admitting: Family

## 2022-07-10 ENCOUNTER — Ambulatory Visit (INDEPENDENT_AMBULATORY_CARE_PROVIDER_SITE_OTHER): Payer: Medicare PPO | Admitting: Family

## 2022-07-10 VITALS — BP 132/70 | HR 65 | Temp 97.4°F | Ht 60.0 in | Wt 148.4 lb

## 2022-07-10 DIAGNOSIS — R300 Dysuria: Secondary | ICD-10-CM | POA: Diagnosis not present

## 2022-07-10 DIAGNOSIS — B3731 Acute candidiasis of vulva and vagina: Secondary | ICD-10-CM | POA: Diagnosis not present

## 2022-07-10 MED ORDER — FLUCONAZOLE 150 MG PO TABS: ORAL_TABLET | ORAL | 0 refills | Status: DC

## 2022-07-10 NOTE — Progress Notes (Signed)
Patient ID: [name], female    DOB: July 16, 1936, 86 y.o.   MRN: [id number]  Chief Complaint  Patient presents with   pain while urinating    HPI:      Dysuria:  with foul odor, urgency and frequency, denies nausea and fever. reports itching and burning when she wipes, denies recent abt use, checked 2 weeks ago for UTI but negative.  Assessment & Plan:  1. Vaginal yeast infection - treating based on sx, pt unable to give urine specimen today after drinking bottle of water. Sending Diflucan, advised on use & SE, call back if sx are not resolved.  - fluconazole (DIFLUCAN) 150 MG tablet; Take 1 pill today and the 2nd pill in 3 days.  Dispense: 2 tablet; Refill: 0  2. Dysuria - pt unable to void to provide a specimen. Treating for yeast based on reported symptoms. Pt to let us know if sx are not improved after taking Diflucan. Advised on increasing her water intake to at least 3 16.9oz bottles per day.    Subjective:    Outpatient Medications Prior to Visit  Medication Sig Dispense Refill   aspirin EC 81 MG tablet Take 81 mg by mouth daily. Swallow whole. Dr. Yong Channel has recommended stopping- you have wanted to continue.     brimonidine-timolol (COMBIGAN) 0.2-0.5 % ophthalmic solution Place 1 drop into both eyes every 12 (twelve) hours.     ezetimibe-simvastatin (VYTORIN) 10-20 MG tablet TAKE 1 TABLET BY MOUTH EVERY DAY 90 tablet 3   Multiple Vitamins-Minerals (MULTIVITAMIN ADULT) CHEW Chew 2 each by mouth daily. Nature Made for Her     nadolol (CORGARD) 40 MG tablet TAKE 1 TABLET BY MOUTH AT BEDTIME 90 tablet 2   omeprazole (PRILOSEC) 20 MG capsule Take 20 mg by mouth daily. In the morning for acid reflux over the counter     SPIKEVAX 50 MCG/0.5ML SUSP Inject 50 mcg into the muscle as directed.     traZODone (DESYREL) 100 MG tablet TAKE 1/4 TO 1/2 TABLET BY MOUTH AT BEDTIME IF NEEDED FOR SLEEP. 45 tablet 3   venlafaxine XR (EFFEXOR-XR) 150 MG 24 hr capsule Take 1 capsule (150 mg  total) by mouth in the morning and at bedtime. 180 capsule 3   No facility-administered medications prior to visit.   Past Medical History:  Diagnosis Date   Anxiety    Cancer ([place])    basal cell on nose   Colon polyps    Complication of anesthesia    takes longer to wake up- admitted overnight after colonoscopy   COVID 05/2021   mild case   Depression    GERD (gastroesophageal reflux disease)    Glaucoma    Headache    migraines   Hx: UTI (urinary tract infection)    Hypercholesteremia    Hypertension    Lichen simplex chronicus    Lumbar stenosis with neurogenic claudication    Macular degeneration    Medical history non-contributory    Osteoarthritis    Osteopenia    of the hip   PONV (postoperative nausea and vomiting)    Stress incontinence, female    Unsteady gait    Wears glasses    Whooping cough    as a baby   Past Surgical History:  Procedure Laterality Date   ABDOMINAL HYSTERECTOMY  1977   ovaries remain   ANTERIOR LAT LUMBAR FUSION N/A 10/26/2017   Procedure: LUMBAR THREE- LUMBAR FOUR, LUMBAR FOUR- LUMBAR FIVE ANTEROLATERAL LUMBAR INTERBODY  ARTHRODESIS;  Surgeon: Jovita Gamma, MD;  Location: [facility];  Service: Neurosurgery;  Laterality: N/A;  LUMBAR 3- LUMBAR 4, LUMBAR 4- LUMBAR 5 ANTEROLATERAL LUMBAR INTERBODY ARTHRODESIS, LUMBAR 3- LUMBAR 4, LUMBAR 4- LUMBAR 5 PERCUTANEOUS PEDICLE SCREW FIXATION   APPENDECTOMY     APPLICATION OF WOUND VAC Left 04/27/2021   Procedure: APPLICATION OF INCISIONAL WOUND VAC;  Surgeon: Erle Crocker, MD;  Location: [facility];  Service: Orthopedics;  Laterality: Left;   APPLICATION OF WOUND VAC Left 07/01/2021   Procedure: APPLICATION OF WOUND VAC;  Surgeon: Cindra Presume, MD;  Location: [facility];  Service: Plastics;  Laterality: Left;   BLADDER REPAIR     x two   CHOLECYSTECTOMY     COLONOSCOPY     EYE SURGERY  11/14, 12/14   Macular Dengeneration, Glaucoma   EYE SURGERY  2016   left eye   I & D EXTREMITY Left  04/27/2021   Procedure: IRRIGATION AND DEBRIDEMENT LEFT LEG;  Surgeon: Erle Crocker, MD;  Location: [facility];  Service: Orthopedics;  Laterality: Left;   INCISION AND DRAINAGE OF WOUND Right 04/27/2021   Procedure: IRRIGATION AND DEBRIDEMENT WOUND;  Surgeon: Erle Crocker, MD;  Location: [facility];  Service: Orthopedics;  Laterality: Right;   LESION REMOVAL Right 06/28/2013   Procedure: EXCISION 3 cm right labial sebaceous cyst;  Surgeon: Lyman Speller, MD;  Location: [facility];  Service: Gynecology;  Laterality: Right;   LUMBAR LAMINECTOMY/DECOMPRESSION MICRODISCECTOMY Right 03/20/2016   Procedure: Right - Lumbar four-five lumbar laminotomy, foraminotomy, and possible microdiscectomy;  Surgeon: Jovita Gamma, MD;  Location: [facility];  Service: Neurosurgery;  Laterality: Right;  right   LUMBAR PERCUTANEOUS PEDICLE SCREW 2 LEVEL  10/26/2017   Procedure: LUMBAR THREE- LUMBAR FOUR, LUMBAR FOUR- LUMBAR FIVE PERCUTANEOUS PEDICLE SCREW FIXATION;  Surgeon: Jovita Gamma, MD;  Location: [facility];  Service: Neurosurgery;;   MOHS SURGERY     procedure to remove basal cell   SKIN SPLIT GRAFT Left 07/01/2021   Procedure: SKIN GRAFT SPLIT THICKNESS;  Surgeon: Cindra Presume, MD;  Location: [facility];  Service: Plastics;  Laterality: Left;  1 hour   TONSILLECTOMY AND ADENOIDECTOMY     URETHRAL SLING  2007   URETHRAL SLING  1/12   midurethral    vertebroplasty secondary to traumatic compression fracture     Allergies  Allergen Reactions   Citrate Of Magnesia Other (See Comments)    confusion   Codeine Nausea And Vomiting   Shingrix [Zoster Vac Recomb Adjuvanted] Swelling and Rash    Rash and temp x several days. (low grade 100 -101 )       Objective:    Physical Exam Vitals and nursing note reviewed.  Constitutional:      Appearance: Normal appearance.  Cardiovascular:     Rate and Rhythm: Normal rate and regular rhythm.  Pulmonary:     Effort: Pulmonary effort is normal.     Breath  sounds: Normal breath sounds.  Musculoskeletal:        General: Normal range of motion.  Skin:    General: Skin is warm and dry.  Neurological:     Mental Status: She is alert.  Psychiatric:        Mood and Affect: Mood normal.        Behavior: Behavior normal.    LMP 08/11/1974  Wt Readings from Last 3 Encounters:  06/23/22 149 lb 9.6 oz (67.9 kg)  05/15/22 144 lb 3.2 oz (65.4 kg)  04/03/22 144 lb 6.4 oz (65.5 kg)       Jeanie Sewer, NP

## 2022-07-11 DIAGNOSIS — M6281 Muscle weakness (generalized): Secondary | ICD-10-CM | POA: Diagnosis not present

## 2022-07-11 DIAGNOSIS — R488 Other symbolic dysfunctions: Secondary | ICD-10-CM | POA: Diagnosis not present

## 2022-07-11 DIAGNOSIS — R41841 Cognitive communication deficit: Secondary | ICD-10-CM | POA: Diagnosis not present

## 2022-07-11 DIAGNOSIS — R2689 Other abnormalities of gait and mobility: Secondary | ICD-10-CM | POA: Diagnosis not present

## 2022-07-14 ENCOUNTER — Telehealth: Payer: Self-pay | Admitting: Family Medicine

## 2022-07-14 DIAGNOSIS — R2689 Other abnormalities of gait and mobility: Secondary | ICD-10-CM | POA: Diagnosis not present

## 2022-07-14 DIAGNOSIS — M6281 Muscle weakness (generalized): Secondary | ICD-10-CM | POA: Diagnosis not present

## 2022-07-14 DIAGNOSIS — B3731 Acute candidiasis of vulva and vagina: Secondary | ICD-10-CM

## 2022-07-14 MED ORDER — FLUCONAZOLE 150 MG PO TABS: ORAL_TABLET | ORAL | 0 refills | Status: DC

## 2022-07-14 NOTE — Telephone Encounter (Signed)
Patient states: - She was prescribed diflucan on 11/30 by Jeanie Sewer - Instructions were to take one pill and then take the 2nd one three days later  - She took the first pill on 12/02 but can't find the last pill   Patient requests: - One tab of diflucan be sent to [id number]  [address], [id number]

## 2022-07-14 NOTE — Telephone Encounter (Signed)
RX sent

## 2022-07-15 DIAGNOSIS — R41841 Cognitive communication deficit: Secondary | ICD-10-CM | POA: Diagnosis not present

## 2022-07-15 DIAGNOSIS — R488 Other symbolic dysfunctions: Secondary | ICD-10-CM | POA: Diagnosis not present

## 2022-07-16 DIAGNOSIS — R488 Other symbolic dysfunctions: Secondary | ICD-10-CM | POA: Diagnosis not present

## 2022-07-16 DIAGNOSIS — M6281 Muscle weakness (generalized): Secondary | ICD-10-CM | POA: Diagnosis not present

## 2022-07-16 DIAGNOSIS — R2689 Other abnormalities of gait and mobility: Secondary | ICD-10-CM | POA: Diagnosis not present

## 2022-07-16 DIAGNOSIS — R41841 Cognitive communication deficit: Secondary | ICD-10-CM | POA: Diagnosis not present

## 2022-07-17 NOTE — Telephone Encounter (Signed)
Yes that is a good idea as it could still be a UTI, but she was unable to provide a urine specimen in the office.  She may want to pick up a white hat & specimen cup (& specimen bag to put urine cup in) and see if she can provide a sample at home to take into her Urology appointment.

## 2022-07-17 NOTE — Telephone Encounter (Signed)
Pt states she was given one to go home with the day of her appointment. Pt states she will contact [facility] to schedule an appointment asap.

## 2022-07-18 ENCOUNTER — Ambulatory Visit: Payer: Medicare PPO | Admitting: Family Medicine

## 2022-07-18 ENCOUNTER — Other Ambulatory Visit: Payer: Self-pay | Admitting: Family Medicine

## 2022-07-18 DIAGNOSIS — M6281 Muscle weakness (generalized): Secondary | ICD-10-CM | POA: Diagnosis not present

## 2022-07-18 DIAGNOSIS — R2689 Other abnormalities of gait and mobility: Secondary | ICD-10-CM | POA: Diagnosis not present

## 2022-07-21 DIAGNOSIS — R2689 Other abnormalities of gait and mobility: Secondary | ICD-10-CM | POA: Diagnosis not present

## 2022-07-21 DIAGNOSIS — M6281 Muscle weakness (generalized): Secondary | ICD-10-CM | POA: Diagnosis not present

## 2022-07-22 DIAGNOSIS — R41841 Cognitive communication deficit: Secondary | ICD-10-CM | POA: Diagnosis not present

## 2022-07-22 DIAGNOSIS — R488 Other symbolic dysfunctions: Secondary | ICD-10-CM | POA: Diagnosis not present

## 2022-07-23 DIAGNOSIS — R41841 Cognitive communication deficit: Secondary | ICD-10-CM | POA: Diagnosis not present

## 2022-07-23 DIAGNOSIS — M6281 Muscle weakness (generalized): Secondary | ICD-10-CM | POA: Diagnosis not present

## 2022-07-23 DIAGNOSIS — R488 Other symbolic dysfunctions: Secondary | ICD-10-CM | POA: Diagnosis not present

## 2022-07-23 DIAGNOSIS — R2689 Other abnormalities of gait and mobility: Secondary | ICD-10-CM | POA: Diagnosis not present

## 2022-07-25 DIAGNOSIS — N3021 Other chronic cystitis with hematuria: Secondary | ICD-10-CM | POA: Diagnosis not present

## 2022-07-28 DIAGNOSIS — M6281 Muscle weakness (generalized): Secondary | ICD-10-CM | POA: Diagnosis not present

## 2022-07-28 DIAGNOSIS — R2689 Other abnormalities of gait and mobility: Secondary | ICD-10-CM | POA: Diagnosis not present

## 2022-07-29 ENCOUNTER — Other Ambulatory Visit: Payer: Self-pay | Admitting: Family Medicine

## 2022-07-29 DIAGNOSIS — R41841 Cognitive communication deficit: Secondary | ICD-10-CM | POA: Diagnosis not present

## 2022-07-29 DIAGNOSIS — R488 Other symbolic dysfunctions: Secondary | ICD-10-CM | POA: Diagnosis not present

## 2022-07-30 DIAGNOSIS — R41841 Cognitive communication deficit: Secondary | ICD-10-CM | POA: Diagnosis not present

## 2022-07-30 DIAGNOSIS — R2689 Other abnormalities of gait and mobility: Secondary | ICD-10-CM | POA: Diagnosis not present

## 2022-07-30 DIAGNOSIS — R488 Other symbolic dysfunctions: Secondary | ICD-10-CM | POA: Diagnosis not present

## 2022-07-30 DIAGNOSIS — M6281 Muscle weakness (generalized): Secondary | ICD-10-CM | POA: Diagnosis not present

## 2022-08-01 DIAGNOSIS — M6281 Muscle weakness (generalized): Secondary | ICD-10-CM | POA: Diagnosis not present

## 2022-08-01 DIAGNOSIS — R2689 Other abnormalities of gait and mobility: Secondary | ICD-10-CM | POA: Diagnosis not present

## 2022-08-05 DIAGNOSIS — R2689 Other abnormalities of gait and mobility: Secondary | ICD-10-CM | POA: Diagnosis not present

## 2022-08-05 DIAGNOSIS — M6281 Muscle weakness (generalized): Secondary | ICD-10-CM | POA: Diagnosis not present

## 2022-08-06 ENCOUNTER — Other Ambulatory Visit: Payer: Self-pay

## 2022-08-06 DIAGNOSIS — M6281 Muscle weakness (generalized): Secondary | ICD-10-CM | POA: Diagnosis not present

## 2022-08-06 DIAGNOSIS — R2689 Other abnormalities of gait and mobility: Secondary | ICD-10-CM | POA: Diagnosis not present

## 2022-08-06 MED ORDER — TRAZODONE HCL 100 MG PO TABS: ORAL_TABLET | ORAL | 3 refills | Status: DC

## 2022-08-06 MED ORDER — NADOLOL 40 MG PO TABS: ORAL_TABLET | ORAL | 2 refills | Status: DC

## 2022-08-08 DIAGNOSIS — R2689 Other abnormalities of gait and mobility: Secondary | ICD-10-CM | POA: Diagnosis not present

## 2022-08-08 DIAGNOSIS — M6281 Muscle weakness (generalized): Secondary | ICD-10-CM | POA: Diagnosis not present

## 2022-08-08 DIAGNOSIS — R41841 Cognitive communication deficit: Secondary | ICD-10-CM | POA: Diagnosis not present

## 2022-08-08 DIAGNOSIS — R488 Other symbolic dysfunctions: Secondary | ICD-10-CM | POA: Diagnosis not present

## 2022-08-29 ENCOUNTER — Telehealth: Payer: Self-pay | Admitting: Family Medicine

## 2022-08-29 NOTE — Telephone Encounter (Signed)
Called and lm on pt vm making her aware that the form was received and faxed this morning.

## 2022-08-29 NOTE — Telephone Encounter (Signed)
Pt states she would like to know if we have received paperwork to fax back to her facility. Please advise.

## 2022-09-01 DIAGNOSIS — Z9181 History of falling: Secondary | ICD-10-CM | POA: Diagnosis not present

## 2022-09-01 DIAGNOSIS — R2681 Unsteadiness on feet: Secondary | ICD-10-CM | POA: Diagnosis not present

## 2022-09-01 DIAGNOSIS — M62562 Muscle wasting and atrophy, not elsewhere classified, left lower leg: Secondary | ICD-10-CM | POA: Diagnosis not present

## 2022-09-01 DIAGNOSIS — R41841 Cognitive communication deficit: Secondary | ICD-10-CM | POA: Diagnosis not present

## 2022-09-01 DIAGNOSIS — R2689 Other abnormalities of gait and mobility: Secondary | ICD-10-CM | POA: Diagnosis not present

## 2022-09-01 DIAGNOSIS — R488 Other symbolic dysfunctions: Secondary | ICD-10-CM | POA: Diagnosis not present

## 2022-09-01 DIAGNOSIS — M62561 Muscle wasting and atrophy, not elsewhere classified, right lower leg: Secondary | ICD-10-CM | POA: Diagnosis not present

## 2022-09-01 DIAGNOSIS — M62551 Muscle wasting and atrophy, not elsewhere classified, right thigh: Secondary | ICD-10-CM | POA: Diagnosis not present

## 2022-09-01 DIAGNOSIS — M62552 Muscle wasting and atrophy, not elsewhere classified, left thigh: Secondary | ICD-10-CM | POA: Diagnosis not present

## 2022-09-02 DIAGNOSIS — M62562 Muscle wasting and atrophy, not elsewhere classified, left lower leg: Secondary | ICD-10-CM | POA: Diagnosis not present

## 2022-09-02 DIAGNOSIS — R2689 Other abnormalities of gait and mobility: Secondary | ICD-10-CM | POA: Diagnosis not present

## 2022-09-02 DIAGNOSIS — Z9181 History of falling: Secondary | ICD-10-CM | POA: Diagnosis not present

## 2022-09-02 DIAGNOSIS — M62561 Muscle wasting and atrophy, not elsewhere classified, right lower leg: Secondary | ICD-10-CM | POA: Diagnosis not present

## 2022-09-02 DIAGNOSIS — R488 Other symbolic dysfunctions: Secondary | ICD-10-CM | POA: Diagnosis not present

## 2022-09-02 DIAGNOSIS — R2681 Unsteadiness on feet: Secondary | ICD-10-CM | POA: Diagnosis not present

## 2022-09-02 DIAGNOSIS — M62552 Muscle wasting and atrophy, not elsewhere classified, left thigh: Secondary | ICD-10-CM | POA: Diagnosis not present

## 2022-09-02 DIAGNOSIS — M62551 Muscle wasting and atrophy, not elsewhere classified, right thigh: Secondary | ICD-10-CM | POA: Diagnosis not present

## 2022-09-02 DIAGNOSIS — R41841 Cognitive communication deficit: Secondary | ICD-10-CM | POA: Diagnosis not present

## 2022-09-05 DIAGNOSIS — R2681 Unsteadiness on feet: Secondary | ICD-10-CM | POA: Diagnosis not present

## 2022-09-05 DIAGNOSIS — M62551 Muscle wasting and atrophy, not elsewhere classified, right thigh: Secondary | ICD-10-CM | POA: Diagnosis not present

## 2022-09-05 DIAGNOSIS — R488 Other symbolic dysfunctions: Secondary | ICD-10-CM | POA: Diagnosis not present

## 2022-09-05 DIAGNOSIS — M62552 Muscle wasting and atrophy, not elsewhere classified, left thigh: Secondary | ICD-10-CM | POA: Diagnosis not present

## 2022-09-05 DIAGNOSIS — Z9181 History of falling: Secondary | ICD-10-CM | POA: Diagnosis not present

## 2022-09-05 DIAGNOSIS — R2689 Other abnormalities of gait and mobility: Secondary | ICD-10-CM | POA: Diagnosis not present

## 2022-09-05 DIAGNOSIS — R41841 Cognitive communication deficit: Secondary | ICD-10-CM | POA: Diagnosis not present

## 2022-09-05 DIAGNOSIS — M62562 Muscle wasting and atrophy, not elsewhere classified, left lower leg: Secondary | ICD-10-CM | POA: Diagnosis not present

## 2022-09-05 DIAGNOSIS — M62561 Muscle wasting and atrophy, not elsewhere classified, right lower leg: Secondary | ICD-10-CM | POA: Diagnosis not present

## 2022-09-08 ENCOUNTER — Telehealth: Payer: Self-pay | Admitting: Family Medicine

## 2022-09-08 DIAGNOSIS — N3021 Other chronic cystitis with hematuria: Secondary | ICD-10-CM | POA: Diagnosis not present

## 2022-09-08 DIAGNOSIS — R8271 Bacteriuria: Secondary | ICD-10-CM | POA: Diagnosis not present

## 2022-09-08 NOTE — Telephone Encounter (Signed)
Patient was not able to be reached by access nurse.  Patient Name: [name] Gender: Female DOB: 06/20/1936 Age: 87 Y 10 M 22 D Return Phone Number: [phone] (Primary) Address: City/ State/ Zip: [place]  [address] Client [facility] [place] Client Site [place] [facility] [place] Night Provider Garret Reddish- MD Contact Type Call Who Is Calling Patient / Member / Family / Caregiver Call Type Triage / Clinical Relationship To Patient Self Return Phone Number [phone] (Primary) Chief Complaint Dizziness Reason for Call Symptomatic / Request for [name] states she started medication and is experiencing dizziness. Translation No Disp. Time Eilene Ghazi Time) Disposition Final User 09/06/2022 10:32:04 AM Attempt made - message left Belac, RN, [place] 09/06/2022 11:03:12 AM Attempt made - message left Belac, RN, Katlin 09/06/2022 11:41:21 AM FINAL ATTEMPT MADE - message left Yes Belac, RN, Katlin Final Disposition 09/06/2022 11:41:21 AM FINAL ATTEMPT MADE - message left

## 2022-09-09 ENCOUNTER — Telehealth: Payer: Self-pay | Admitting: Family Medicine

## 2022-09-09 DIAGNOSIS — M62552 Muscle wasting and atrophy, not elsewhere classified, left thigh: Secondary | ICD-10-CM | POA: Diagnosis not present

## 2022-09-09 DIAGNOSIS — R2689 Other abnormalities of gait and mobility: Secondary | ICD-10-CM | POA: Diagnosis not present

## 2022-09-09 DIAGNOSIS — R2681 Unsteadiness on feet: Secondary | ICD-10-CM | POA: Diagnosis not present

## 2022-09-09 DIAGNOSIS — M62561 Muscle wasting and atrophy, not elsewhere classified, right lower leg: Secondary | ICD-10-CM | POA: Diagnosis not present

## 2022-09-09 DIAGNOSIS — R41841 Cognitive communication deficit: Secondary | ICD-10-CM | POA: Diagnosis not present

## 2022-09-09 DIAGNOSIS — R488 Other symbolic dysfunctions: Secondary | ICD-10-CM | POA: Diagnosis not present

## 2022-09-09 DIAGNOSIS — Z9181 History of falling: Secondary | ICD-10-CM | POA: Diagnosis not present

## 2022-09-09 DIAGNOSIS — M62562 Muscle wasting and atrophy, not elsewhere classified, left lower leg: Secondary | ICD-10-CM | POA: Diagnosis not present

## 2022-09-09 DIAGNOSIS — M62551 Muscle wasting and atrophy, not elsewhere classified, right thigh: Secondary | ICD-10-CM | POA: Diagnosis not present

## 2022-09-09 NOTE — Telephone Encounter (Signed)
Pt has questions about an over the counter berry pill. If she can take it or not. Please advise.

## 2022-09-10 ENCOUNTER — Telehealth: Payer: Self-pay | Admitting: Family Medicine

## 2022-09-10 DIAGNOSIS — M62561 Muscle wasting and atrophy, not elsewhere classified, right lower leg: Secondary | ICD-10-CM | POA: Diagnosis not present

## 2022-09-10 DIAGNOSIS — R2681 Unsteadiness on feet: Secondary | ICD-10-CM | POA: Diagnosis not present

## 2022-09-10 DIAGNOSIS — Z9181 History of falling: Secondary | ICD-10-CM | POA: Diagnosis not present

## 2022-09-10 DIAGNOSIS — R41841 Cognitive communication deficit: Secondary | ICD-10-CM | POA: Diagnosis not present

## 2022-09-10 DIAGNOSIS — M62551 Muscle wasting and atrophy, not elsewhere classified, right thigh: Secondary | ICD-10-CM | POA: Diagnosis not present

## 2022-09-10 DIAGNOSIS — M62562 Muscle wasting and atrophy, not elsewhere classified, left lower leg: Secondary | ICD-10-CM | POA: Diagnosis not present

## 2022-09-10 DIAGNOSIS — R488 Other symbolic dysfunctions: Secondary | ICD-10-CM | POA: Diagnosis not present

## 2022-09-10 DIAGNOSIS — R2689 Other abnormalities of gait and mobility: Secondary | ICD-10-CM | POA: Diagnosis not present

## 2022-09-10 DIAGNOSIS — M62552 Muscle wasting and atrophy, not elsewhere classified, left thigh: Secondary | ICD-10-CM | POA: Diagnosis not present

## 2022-09-10 NOTE — Telephone Encounter (Signed)
Patient requests a new RX with a lower dosage (Patient states the current dosage is causing a headache when waking up and dizziness) for venlafaxine XR (EFFEXOR-XR) 150 MG 24 hr capsule   Be sent to:  [facility] [place] Phone: [phone]  Fax: [phone]

## 2022-09-11 DIAGNOSIS — M62561 Muscle wasting and atrophy, not elsewhere classified, right lower leg: Secondary | ICD-10-CM | POA: Diagnosis not present

## 2022-09-11 DIAGNOSIS — R2689 Other abnormalities of gait and mobility: Secondary | ICD-10-CM | POA: Diagnosis not present

## 2022-09-11 DIAGNOSIS — R41841 Cognitive communication deficit: Secondary | ICD-10-CM | POA: Diagnosis not present

## 2022-09-11 DIAGNOSIS — M62551 Muscle wasting and atrophy, not elsewhere classified, right thigh: Secondary | ICD-10-CM | POA: Diagnosis not present

## 2022-09-11 DIAGNOSIS — R488 Other symbolic dysfunctions: Secondary | ICD-10-CM | POA: Diagnosis not present

## 2022-09-11 DIAGNOSIS — R2681 Unsteadiness on feet: Secondary | ICD-10-CM | POA: Diagnosis not present

## 2022-09-11 DIAGNOSIS — Z9181 History of falling: Secondary | ICD-10-CM | POA: Diagnosis not present

## 2022-09-11 DIAGNOSIS — M62552 Muscle wasting and atrophy, not elsewhere classified, left thigh: Secondary | ICD-10-CM | POA: Diagnosis not present

## 2022-09-11 DIAGNOSIS — M62562 Muscle wasting and atrophy, not elsewhere classified, left lower leg: Secondary | ICD-10-CM | POA: Diagnosis not present

## 2022-09-11 NOTE — Telephone Encounter (Signed)
Called and spoke with pt and she would like to try the below option again sent to [facility].

## 2022-09-11 NOTE — Telephone Encounter (Signed)
She should have some remaining 75 mg tablets I believe-did she run out?  Or did she throw them away?

## 2022-09-11 NOTE — Telephone Encounter (Signed)
FYI, ov needed to discuss decrease?

## 2022-09-11 NOTE — Telephone Encounter (Signed)
We previously tried 150 mg in the morning and 75 mg in the evening of the same medication and she reported feeling very tearful-if she would like to try this again I am okay with that-does she have any 75 mg tablets to use?  She would take 150 mg in the morning and then only 75 mg in the evening of the venlafaxine extended release in that case

## 2022-09-12 DIAGNOSIS — R488 Other symbolic dysfunctions: Secondary | ICD-10-CM | POA: Diagnosis not present

## 2022-09-12 DIAGNOSIS — R41841 Cognitive communication deficit: Secondary | ICD-10-CM | POA: Diagnosis not present

## 2022-09-12 DIAGNOSIS — M62562 Muscle wasting and atrophy, not elsewhere classified, left lower leg: Secondary | ICD-10-CM | POA: Diagnosis not present

## 2022-09-12 DIAGNOSIS — M62561 Muscle wasting and atrophy, not elsewhere classified, right lower leg: Secondary | ICD-10-CM | POA: Diagnosis not present

## 2022-09-12 DIAGNOSIS — Z9181 History of falling: Secondary | ICD-10-CM | POA: Diagnosis not present

## 2022-09-12 DIAGNOSIS — R2689 Other abnormalities of gait and mobility: Secondary | ICD-10-CM | POA: Diagnosis not present

## 2022-09-12 DIAGNOSIS — M62551 Muscle wasting and atrophy, not elsewhere classified, right thigh: Secondary | ICD-10-CM | POA: Diagnosis not present

## 2022-09-12 DIAGNOSIS — M62552 Muscle wasting and atrophy, not elsewhere classified, left thigh: Secondary | ICD-10-CM | POA: Diagnosis not present

## 2022-09-12 DIAGNOSIS — R2681 Unsteadiness on feet: Secondary | ICD-10-CM | POA: Diagnosis not present

## 2022-09-12 MED ORDER — VENLAFAXINE HCL ER 150 MG PO CP24: 150.0000 mg | ORAL_CAPSULE | Freq: Every day | ORAL | 3 refills | Status: DC

## 2022-09-12 MED ORDER — VENLAFAXINE HCL ER 75 MG PO CP24: 75.0000 mg | ORAL_CAPSULE | Freq: Every day | ORAL | 5 refills | Status: DC

## 2022-09-12 NOTE — Telephone Encounter (Signed)
I sent this to pharmacy-150 mg in the morning and 75 mg in the evening of venlafaxine extended release  Also appears she still has Spikevax on her medication list-please make sure immunization is logged in and delete this from her medication list

## 2022-09-12 NOTE — Addendum Note (Signed)
Addended by: Marin Olp on: 09/12/2022 05:40 PM   Modules accepted: Orders

## 2022-09-12 NOTE — Telephone Encounter (Signed)
Called and spoke with pt and she does not have any of the '75mg'$  left. Needs to be sent to [facility].

## 2022-09-15 ENCOUNTER — Other Ambulatory Visit: Payer: Self-pay

## 2022-09-15 DIAGNOSIS — R2681 Unsteadiness on feet: Secondary | ICD-10-CM | POA: Diagnosis not present

## 2022-09-15 DIAGNOSIS — M62561 Muscle wasting and atrophy, not elsewhere classified, right lower leg: Secondary | ICD-10-CM | POA: Diagnosis not present

## 2022-09-15 DIAGNOSIS — M62552 Muscle wasting and atrophy, not elsewhere classified, left thigh: Secondary | ICD-10-CM | POA: Diagnosis not present

## 2022-09-15 DIAGNOSIS — R41841 Cognitive communication deficit: Secondary | ICD-10-CM | POA: Diagnosis not present

## 2022-09-15 DIAGNOSIS — R488 Other symbolic dysfunctions: Secondary | ICD-10-CM | POA: Diagnosis not present

## 2022-09-15 DIAGNOSIS — M62562 Muscle wasting and atrophy, not elsewhere classified, left lower leg: Secondary | ICD-10-CM | POA: Diagnosis not present

## 2022-09-15 DIAGNOSIS — M62551 Muscle wasting and atrophy, not elsewhere classified, right thigh: Secondary | ICD-10-CM | POA: Diagnosis not present

## 2022-09-15 DIAGNOSIS — R2689 Other abnormalities of gait and mobility: Secondary | ICD-10-CM | POA: Diagnosis not present

## 2022-09-15 DIAGNOSIS — Z9181 History of falling: Secondary | ICD-10-CM | POA: Diagnosis not present

## 2022-09-15 NOTE — Telephone Encounter (Signed)
Spikevax documented and removed from med list.

## 2022-09-18 DIAGNOSIS — M62552 Muscle wasting and atrophy, not elsewhere classified, left thigh: Secondary | ICD-10-CM | POA: Diagnosis not present

## 2022-09-18 DIAGNOSIS — R488 Other symbolic dysfunctions: Secondary | ICD-10-CM | POA: Diagnosis not present

## 2022-09-18 DIAGNOSIS — R2681 Unsteadiness on feet: Secondary | ICD-10-CM | POA: Diagnosis not present

## 2022-09-18 DIAGNOSIS — M62562 Muscle wasting and atrophy, not elsewhere classified, left lower leg: Secondary | ICD-10-CM | POA: Diagnosis not present

## 2022-09-18 DIAGNOSIS — M62551 Muscle wasting and atrophy, not elsewhere classified, right thigh: Secondary | ICD-10-CM | POA: Diagnosis not present

## 2022-09-18 DIAGNOSIS — R2689 Other abnormalities of gait and mobility: Secondary | ICD-10-CM | POA: Diagnosis not present

## 2022-09-18 DIAGNOSIS — Z9181 History of falling: Secondary | ICD-10-CM | POA: Diagnosis not present

## 2022-09-18 DIAGNOSIS — R41841 Cognitive communication deficit: Secondary | ICD-10-CM | POA: Diagnosis not present

## 2022-09-18 DIAGNOSIS — M62561 Muscle wasting and atrophy, not elsewhere classified, right lower leg: Secondary | ICD-10-CM | POA: Diagnosis not present

## 2022-09-19 DIAGNOSIS — R2689 Other abnormalities of gait and mobility: Secondary | ICD-10-CM | POA: Diagnosis not present

## 2022-09-19 DIAGNOSIS — R2681 Unsteadiness on feet: Secondary | ICD-10-CM | POA: Diagnosis not present

## 2022-09-19 DIAGNOSIS — M62561 Muscle wasting and atrophy, not elsewhere classified, right lower leg: Secondary | ICD-10-CM | POA: Diagnosis not present

## 2022-09-19 DIAGNOSIS — M62551 Muscle wasting and atrophy, not elsewhere classified, right thigh: Secondary | ICD-10-CM | POA: Diagnosis not present

## 2022-09-19 DIAGNOSIS — M62552 Muscle wasting and atrophy, not elsewhere classified, left thigh: Secondary | ICD-10-CM | POA: Diagnosis not present

## 2022-09-19 DIAGNOSIS — Z9181 History of falling: Secondary | ICD-10-CM | POA: Diagnosis not present

## 2022-09-19 DIAGNOSIS — R41841 Cognitive communication deficit: Secondary | ICD-10-CM | POA: Diagnosis not present

## 2022-09-19 DIAGNOSIS — R488 Other symbolic dysfunctions: Secondary | ICD-10-CM | POA: Diagnosis not present

## 2022-09-19 DIAGNOSIS — M62562 Muscle wasting and atrophy, not elsewhere classified, left lower leg: Secondary | ICD-10-CM | POA: Diagnosis not present

## 2022-09-22 DIAGNOSIS — R2681 Unsteadiness on feet: Secondary | ICD-10-CM | POA: Diagnosis not present

## 2022-09-22 DIAGNOSIS — R488 Other symbolic dysfunctions: Secondary | ICD-10-CM | POA: Diagnosis not present

## 2022-09-22 DIAGNOSIS — M62551 Muscle wasting and atrophy, not elsewhere classified, right thigh: Secondary | ICD-10-CM | POA: Diagnosis not present

## 2022-09-22 DIAGNOSIS — R2689 Other abnormalities of gait and mobility: Secondary | ICD-10-CM | POA: Diagnosis not present

## 2022-09-22 DIAGNOSIS — M62552 Muscle wasting and atrophy, not elsewhere classified, left thigh: Secondary | ICD-10-CM | POA: Diagnosis not present

## 2022-09-22 DIAGNOSIS — Z9181 History of falling: Secondary | ICD-10-CM | POA: Diagnosis not present

## 2022-09-22 DIAGNOSIS — M62562 Muscle wasting and atrophy, not elsewhere classified, left lower leg: Secondary | ICD-10-CM | POA: Diagnosis not present

## 2022-09-22 DIAGNOSIS — R41841 Cognitive communication deficit: Secondary | ICD-10-CM | POA: Diagnosis not present

## 2022-09-22 DIAGNOSIS — M62561 Muscle wasting and atrophy, not elsewhere classified, right lower leg: Secondary | ICD-10-CM | POA: Diagnosis not present

## 2022-09-24 DIAGNOSIS — M62551 Muscle wasting and atrophy, not elsewhere classified, right thigh: Secondary | ICD-10-CM | POA: Diagnosis not present

## 2022-09-24 DIAGNOSIS — R2681 Unsteadiness on feet: Secondary | ICD-10-CM | POA: Diagnosis not present

## 2022-09-24 DIAGNOSIS — R41841 Cognitive communication deficit: Secondary | ICD-10-CM | POA: Diagnosis not present

## 2022-09-24 DIAGNOSIS — R2689 Other abnormalities of gait and mobility: Secondary | ICD-10-CM | POA: Diagnosis not present

## 2022-09-24 DIAGNOSIS — M62562 Muscle wasting and atrophy, not elsewhere classified, left lower leg: Secondary | ICD-10-CM | POA: Diagnosis not present

## 2022-09-24 DIAGNOSIS — R488 Other symbolic dysfunctions: Secondary | ICD-10-CM | POA: Diagnosis not present

## 2022-09-24 DIAGNOSIS — M62561 Muscle wasting and atrophy, not elsewhere classified, right lower leg: Secondary | ICD-10-CM | POA: Diagnosis not present

## 2022-09-24 DIAGNOSIS — M62552 Muscle wasting and atrophy, not elsewhere classified, left thigh: Secondary | ICD-10-CM | POA: Diagnosis not present

## 2022-09-24 DIAGNOSIS — Z9181 History of falling: Secondary | ICD-10-CM | POA: Diagnosis not present

## 2022-09-25 DIAGNOSIS — R2689 Other abnormalities of gait and mobility: Secondary | ICD-10-CM | POA: Diagnosis not present

## 2022-09-25 DIAGNOSIS — Z9181 History of falling: Secondary | ICD-10-CM | POA: Diagnosis not present

## 2022-09-25 DIAGNOSIS — M62552 Muscle wasting and atrophy, not elsewhere classified, left thigh: Secondary | ICD-10-CM | POA: Diagnosis not present

## 2022-09-25 DIAGNOSIS — M62551 Muscle wasting and atrophy, not elsewhere classified, right thigh: Secondary | ICD-10-CM | POA: Diagnosis not present

## 2022-09-25 DIAGNOSIS — M62561 Muscle wasting and atrophy, not elsewhere classified, right lower leg: Secondary | ICD-10-CM | POA: Diagnosis not present

## 2022-09-25 DIAGNOSIS — M62562 Muscle wasting and atrophy, not elsewhere classified, left lower leg: Secondary | ICD-10-CM | POA: Diagnosis not present

## 2022-09-25 DIAGNOSIS — R41841 Cognitive communication deficit: Secondary | ICD-10-CM | POA: Diagnosis not present

## 2022-09-25 DIAGNOSIS — R488 Other symbolic dysfunctions: Secondary | ICD-10-CM | POA: Diagnosis not present

## 2022-09-25 DIAGNOSIS — R2681 Unsteadiness on feet: Secondary | ICD-10-CM | POA: Diagnosis not present

## 2022-09-26 ENCOUNTER — Ambulatory Visit (INDEPENDENT_AMBULATORY_CARE_PROVIDER_SITE_OTHER): Payer: Medicare HMO | Admitting: Family Medicine

## 2022-09-26 ENCOUNTER — Encounter: Payer: Self-pay | Admitting: Family Medicine

## 2022-09-26 VITALS — BP 118/60 | HR 67 | Temp 97.0°F | Ht 60.0 in | Wt 144.6 lb

## 2022-09-26 DIAGNOSIS — R2681 Unsteadiness on feet: Secondary | ICD-10-CM | POA: Diagnosis not present

## 2022-09-26 DIAGNOSIS — K769 Liver disease, unspecified: Secondary | ICD-10-CM | POA: Diagnosis not present

## 2022-09-26 DIAGNOSIS — R413 Other amnesia: Secondary | ICD-10-CM | POA: Diagnosis not present

## 2022-09-26 DIAGNOSIS — R41841 Cognitive communication deficit: Secondary | ICD-10-CM | POA: Diagnosis not present

## 2022-09-26 DIAGNOSIS — M62552 Muscle wasting and atrophy, not elsewhere classified, left thigh: Secondary | ICD-10-CM | POA: Diagnosis not present

## 2022-09-26 DIAGNOSIS — R488 Other symbolic dysfunctions: Secondary | ICD-10-CM | POA: Diagnosis not present

## 2022-09-26 DIAGNOSIS — Z9181 History of falling: Secondary | ICD-10-CM | POA: Diagnosis not present

## 2022-09-26 DIAGNOSIS — M62551 Muscle wasting and atrophy, not elsewhere classified, right thigh: Secondary | ICD-10-CM | POA: Diagnosis not present

## 2022-09-26 DIAGNOSIS — R2689 Other abnormalities of gait and mobility: Secondary | ICD-10-CM | POA: Diagnosis not present

## 2022-09-26 DIAGNOSIS — M62561 Muscle wasting and atrophy, not elsewhere classified, right lower leg: Secondary | ICD-10-CM | POA: Diagnosis not present

## 2022-09-26 DIAGNOSIS — M62562 Muscle wasting and atrophy, not elsewhere classified, left lower leg: Secondary | ICD-10-CM | POA: Diagnosis not present

## 2022-09-26 MED ORDER — NADOLOL 20 MG PO TABS: 20.0000 mg | ORAL_TABLET | Freq: Every day | ORAL | 3 refills | Status: DC

## 2022-09-26 NOTE — Progress Notes (Signed)
Phone [phone] In person visit   Subjective:   [name] is a 87 y.o. year old very pleasant female patient who presents for/with See problem oriented charting Chief Complaint  Patient presents with   discuss medications    Pt is here to discuss venlafaxine and other meds due to something making her dizzy.   Past Medical History-  Patient Active Problem List   Diagnosis Date Noted   Liver lesion 11/10/2017    Priority: High   Aortic atherosclerosis ([place]) 01/17/2021    Priority: Medium    Memory changes 03/06/2017    Priority: Medium    Lumbar stenosis with neurogenic claudication 03/20/2016    Priority: Medium    Scalp pain 10/22/2015    Priority: Medium    Macular degeneration of right eye 03/03/2013    Priority: Medium    Glaucoma 03/03/2013    Priority: Medium    Hyperlipidemia 03/14/2009    Priority: Medium    Depression 10/19/2007    Priority: Medium    Essential hypertension 10/19/2007    Priority: Medium    GASTROESOPHAGEAL REFLUX DISEASE 10/19/2007    Priority: Medium    Chest pain 01/24/2018    Priority: Low   Post-traumatic headache 10/11/2015    Priority: Low   Routine health maintenance 03/06/2013    Priority: Low   Osteoarthritis 10/19/2007    Priority: Low   STRESS INCONTINENCE 10/19/2007    Priority: Low   Allergic rhinitis 10/19/2007    Priority: Low   Major depressive disorder with single episode, in full remission ([place]) 10/22/2021   Osteopenia of neck of right femur 03/29/2019   Nausea & vomiting 10/31/2017   Chronic daily headache 07/08/2017    Medications- reviewed and updated Current Outpatient Medications  Medication Sig Dispense Refill   brimonidine-timolol (COMBIGAN) 0.2-0.5 % ophthalmic solution Place 1 drop into both eyes every 12 (twelve) hours.     ezetimibe-simvastatin (VYTORIN) 10-20 MG tablet TAKE 1 TABLET BY MOUTH EVERY DAY 90 tablet 2   Multiple Vitamins-Minerals (MULTIVITAMIN ADULT) CHEW Chew 2 each by mouth  daily. Nature Made for Her     nadolol (CORGARD) 20 MG tablet Take 1 tablet (20 mg total) by mouth at bedtime. 90 tablet 3   omeprazole (PRILOSEC) 20 MG capsule Take 20 mg by mouth daily. In the morning for acid reflux over the counter     traZODone (DESYREL) 100 MG tablet TAKE 1/4 TO 1/2 TABLET BY MOUTH AT BEDTIME IF NEEDED FOR SLEEP. 45 tablet 3   venlafaxine XR (EFFEXOR XR) 75 MG 24 hr capsule Take 1 capsule (75 mg total) by mouth daily before supper. 30 capsule 5   venlafaxine XR (EFFEXOR-XR) 150 MG 24 hr capsule Take 1 capsule (150 mg total) by mouth daily with breakfast. 90 capsule 3   No current facility-administered medications for this visit.     Objective:  BP 118/60   Pulse 67   Temp (!) 97 F (36.1 C)   Ht 5' (1.524 m)   Wt 144 lb 9.6 oz (65.6 kg)   LMP 08/11/1974   SpO2 98%   BMI 28.24 kg/m  Gen: NAD, resting comfortably CV: RRR no murmurs rubs or gallops Lungs: CTAB no crackles, wheeze, rhonchi Ext: no edema Skin: warm, dry Neuro: Walks with walker, looks to son for many answers    Assessment and Plan    # memory changes S:has noted issues with memory dating back at least to 2018 (after surgery/ileus) or so- family has  noted as well- seems worse in last 6 months. At that time MMSE was 29/30.  A/P: with ongoing issues and now noting more hallucinations we opted for neurology consult  -With memory changes we also recommended stopping driving though she does not drive a lot and has not gotten lost yet-we want to be proactive  # Abnormal liver imaging-see report from CT abdomen pelvis from 2019-recommend MRI follow-up.  We had a follow-up exam where these were noted on outside imaging in 2022 as well.  Tried to order imaging at that time but I do not see completion-she is agreeable to pursuing this now-MRI liver with and without contrast was ordered  # Dizziness S: Patient is concerned as medication could be causing dizziness.most commonly when wakes up in am. Does  not check BP regularly.  A/P: She is concerned that venlafaxine could be causing this and we have recently reduced the dose-she has not noted any improvement.  Her blood pressure was running lower at 118/60-we opted to decrease nadolol to 20 mg to see if this makes any difference for her-check again at physical  # Depression S: Medication: Patient had been on long-term venlafaxine extended release 150 mg twice a day-we had tried 150 mg in the morning and 75 mg in the evening in 2023 after a friend of hers had noticed tremulousness and thought could be related to medicine but she noted worsening depression so she increased back to 150 mg twice daily.  She reached out again in early February concerned that 150 mg twice daily could be helpful dizziness I wanted to retrial the lower dose -She did this her husband in 2023 -Also takes a fourth of trazodone 100 mg for sleep  -occasionally notes things outside that seem to be moving that others dont see- has also had other visual hallucinations. Has seen things like people sticking fingers under her door and wiggling them. Usually happening at night. Reports has had issues like this for several years. Son has only noticed it recently- she reports it has worsened since passing of husband last july    09/26/2022    1:33 PM 06/23/2022    3:02 PM 06/23/2022    2:32 PM  Depression screen PHQ 2/9  Decreased Interest 0 0 0  Down, Depressed, Hopeless 0 0 0  PHQ - 2 Score 0 0 0  Altered sleeping 0 0   Tired, decreased energy 1 0   Change in appetite 0 1   Feeling bad or failure about yourself  0 0   Trouble concentrating 3 0   Moving slowly or fidgety/restless 0 0   Suicidal thoughts 0 0   PHQ-9 Score 4 1   Difficult doing work/chores Somewhat difficult Not difficult at all    A/P: Depression remains well-controlled despite loss of husband in July of last year.  Seems to be having some hallucinations but wonder if this could be related to memory issues or  sundowning-we considered reducing venlafaxine further 250 mg in the morning or 75 mg twice daily but she wanted to hold off on any adjustments for now-we agreed they could reach out to me in 2 to 4 weeks if they would like to further evaluate  #hypertension S: medication: Nadolol 40 mg BP Readings from Last 3 Encounters:  09/26/22 118/60  07/10/22 132/70  06/23/22 129/76  A/P: Perhaps mild overcontrolled with feeling lightheaded-reduce nadolol to 20 mg-pending she will be able to tolerate this-pressures seem lower since stress from loss of husband  has improved   #hyperlipidemia S: Medication: Vytorin 10-20 mg Lab Results  Component Value Date   CHOL 151 10/22/2021   HDL 51.70 10/22/2021   LDLCALC 68 10/22/2021   LDLDIRECT 84.0 03/06/2017   TRIG 161.0 (H) 10/22/2021   CHOLHDL 3 10/22/2021   A/P: Close to ideal control-continue current medication-not quite due for full lipid repeat   Recommended follow up: Return in about 4 months (around 01/25/2023) for physical or sooner if needed.Schedule b4 you leave. Future Appointments  Date Time Provider [place]  10/14/2022  1:20 PM Marin Olp, MD [facility]  10/21/2022 12:00 PM Beverly Milch L, [facility] None    Lab/Order associations:   ICD-10-CM   1. Memory changes  R41.3 Ambulatory referral to Neurology    2. Liver disease  K76.9 MR LIVER W WO CONTRAST    3. Liver lesion  K76.9 MR LIVER W WO CONTRAST      Meds ordered this encounter  Medications   nadolol (CORGARD) 20 MG tablet    Sig: Take 1 tablet (20 mg total) by mouth at bedtime.    Dispense:  90 tablet    Refill:  3    Return precautions advised.  Garret Reddish, MD

## 2022-09-26 NOTE — Assessment & Plan Note (Signed)
#   memory changes S:has noted issues with memory dating back at least to 2018 (after surgery/ileus) or so- family has noted as well- seems worse in last 6 months. At that time MMSE was 29/30.  A/P: with ongoing issues and now noting more hallucinations we opted for neurology consult

## 2022-09-26 NOTE — Patient Instructions (Addendum)
We will either call you (or see alternate below) within two weeks about your referral to neurology . Our referral specialist will sometimes also send you a mychart link once referral is approved and then you will call the # listed on there (let us know if you do not see this within 2 weeks or have not received call)  We will call you within two weeks about your referral for MRI liver through [facility].  Their phone number is [phone].  Please call them if you have not heard in 1-2 weeks  Stop aspirin 81 mg  Reduce nadolol to 20 mg to see if helps with dizziness  Recommended follow up: Return in about 4 months (around 01/25/2023) for physical or sooner if needed.Schedule b4 you leave.

## 2022-09-29 DIAGNOSIS — R2681 Unsteadiness on feet: Secondary | ICD-10-CM | POA: Diagnosis not present

## 2022-09-29 DIAGNOSIS — M62551 Muscle wasting and atrophy, not elsewhere classified, right thigh: Secondary | ICD-10-CM | POA: Diagnosis not present

## 2022-09-29 DIAGNOSIS — M62552 Muscle wasting and atrophy, not elsewhere classified, left thigh: Secondary | ICD-10-CM | POA: Diagnosis not present

## 2022-09-29 DIAGNOSIS — R488 Other symbolic dysfunctions: Secondary | ICD-10-CM | POA: Diagnosis not present

## 2022-09-29 DIAGNOSIS — R2689 Other abnormalities of gait and mobility: Secondary | ICD-10-CM | POA: Diagnosis not present

## 2022-09-29 DIAGNOSIS — M62561 Muscle wasting and atrophy, not elsewhere classified, right lower leg: Secondary | ICD-10-CM | POA: Diagnosis not present

## 2022-09-29 DIAGNOSIS — M62562 Muscle wasting and atrophy, not elsewhere classified, left lower leg: Secondary | ICD-10-CM | POA: Diagnosis not present

## 2022-09-29 DIAGNOSIS — R41841 Cognitive communication deficit: Secondary | ICD-10-CM | POA: Diagnosis not present

## 2022-09-29 DIAGNOSIS — Z9181 History of falling: Secondary | ICD-10-CM | POA: Diagnosis not present

## 2022-09-30 DIAGNOSIS — M62561 Muscle wasting and atrophy, not elsewhere classified, right lower leg: Secondary | ICD-10-CM | POA: Diagnosis not present

## 2022-09-30 DIAGNOSIS — M62552 Muscle wasting and atrophy, not elsewhere classified, left thigh: Secondary | ICD-10-CM | POA: Diagnosis not present

## 2022-09-30 DIAGNOSIS — R488 Other symbolic dysfunctions: Secondary | ICD-10-CM | POA: Diagnosis not present

## 2022-09-30 DIAGNOSIS — R2689 Other abnormalities of gait and mobility: Secondary | ICD-10-CM | POA: Diagnosis not present

## 2022-09-30 DIAGNOSIS — M62562 Muscle wasting and atrophy, not elsewhere classified, left lower leg: Secondary | ICD-10-CM | POA: Diagnosis not present

## 2022-09-30 DIAGNOSIS — R41841 Cognitive communication deficit: Secondary | ICD-10-CM | POA: Diagnosis not present

## 2022-09-30 DIAGNOSIS — R2681 Unsteadiness on feet: Secondary | ICD-10-CM | POA: Diagnosis not present

## 2022-09-30 DIAGNOSIS — M62551 Muscle wasting and atrophy, not elsewhere classified, right thigh: Secondary | ICD-10-CM | POA: Diagnosis not present

## 2022-09-30 DIAGNOSIS — Z9181 History of falling: Secondary | ICD-10-CM | POA: Diagnosis not present

## 2022-10-01 ENCOUNTER — Encounter: Payer: Self-pay | Admitting: Physician Assistant

## 2022-10-01 DIAGNOSIS — R41841 Cognitive communication deficit: Secondary | ICD-10-CM | POA: Diagnosis not present

## 2022-10-01 DIAGNOSIS — R2689 Other abnormalities of gait and mobility: Secondary | ICD-10-CM | POA: Diagnosis not present

## 2022-10-01 DIAGNOSIS — M62551 Muscle wasting and atrophy, not elsewhere classified, right thigh: Secondary | ICD-10-CM | POA: Diagnosis not present

## 2022-10-01 DIAGNOSIS — M62562 Muscle wasting and atrophy, not elsewhere classified, left lower leg: Secondary | ICD-10-CM | POA: Diagnosis not present

## 2022-10-01 DIAGNOSIS — M62552 Muscle wasting and atrophy, not elsewhere classified, left thigh: Secondary | ICD-10-CM | POA: Diagnosis not present

## 2022-10-01 DIAGNOSIS — M62561 Muscle wasting and atrophy, not elsewhere classified, right lower leg: Secondary | ICD-10-CM | POA: Diagnosis not present

## 2022-10-01 DIAGNOSIS — Z9181 History of falling: Secondary | ICD-10-CM | POA: Diagnosis not present

## 2022-10-01 DIAGNOSIS — R2681 Unsteadiness on feet: Secondary | ICD-10-CM | POA: Diagnosis not present

## 2022-10-01 DIAGNOSIS — R488 Other symbolic dysfunctions: Secondary | ICD-10-CM | POA: Diagnosis not present

## 2022-10-03 DIAGNOSIS — R2681 Unsteadiness on feet: Secondary | ICD-10-CM | POA: Diagnosis not present

## 2022-10-03 DIAGNOSIS — R2689 Other abnormalities of gait and mobility: Secondary | ICD-10-CM | POA: Diagnosis not present

## 2022-10-03 DIAGNOSIS — M62562 Muscle wasting and atrophy, not elsewhere classified, left lower leg: Secondary | ICD-10-CM | POA: Diagnosis not present

## 2022-10-03 DIAGNOSIS — R488 Other symbolic dysfunctions: Secondary | ICD-10-CM | POA: Diagnosis not present

## 2022-10-03 DIAGNOSIS — M62552 Muscle wasting and atrophy, not elsewhere classified, left thigh: Secondary | ICD-10-CM | POA: Diagnosis not present

## 2022-10-03 DIAGNOSIS — R41841 Cognitive communication deficit: Secondary | ICD-10-CM | POA: Diagnosis not present

## 2022-10-03 DIAGNOSIS — M62561 Muscle wasting and atrophy, not elsewhere classified, right lower leg: Secondary | ICD-10-CM | POA: Diagnosis not present

## 2022-10-03 DIAGNOSIS — M62551 Muscle wasting and atrophy, not elsewhere classified, right thigh: Secondary | ICD-10-CM | POA: Diagnosis not present

## 2022-10-03 DIAGNOSIS — Z9181 History of falling: Secondary | ICD-10-CM | POA: Diagnosis not present

## 2022-10-06 DIAGNOSIS — M62562 Muscle wasting and atrophy, not elsewhere classified, left lower leg: Secondary | ICD-10-CM | POA: Diagnosis not present

## 2022-10-06 DIAGNOSIS — Z9181 History of falling: Secondary | ICD-10-CM | POA: Diagnosis not present

## 2022-10-06 DIAGNOSIS — R2681 Unsteadiness on feet: Secondary | ICD-10-CM | POA: Diagnosis not present

## 2022-10-06 DIAGNOSIS — M62561 Muscle wasting and atrophy, not elsewhere classified, right lower leg: Secondary | ICD-10-CM | POA: Diagnosis not present

## 2022-10-06 DIAGNOSIS — R488 Other symbolic dysfunctions: Secondary | ICD-10-CM | POA: Diagnosis not present

## 2022-10-06 DIAGNOSIS — M62551 Muscle wasting and atrophy, not elsewhere classified, right thigh: Secondary | ICD-10-CM | POA: Diagnosis not present

## 2022-10-06 DIAGNOSIS — R41841 Cognitive communication deficit: Secondary | ICD-10-CM | POA: Diagnosis not present

## 2022-10-06 DIAGNOSIS — R2689 Other abnormalities of gait and mobility: Secondary | ICD-10-CM | POA: Diagnosis not present

## 2022-10-06 DIAGNOSIS — M62552 Muscle wasting and atrophy, not elsewhere classified, left thigh: Secondary | ICD-10-CM | POA: Diagnosis not present

## 2022-10-07 ENCOUNTER — Encounter: Payer: Self-pay | Admitting: Physician Assistant

## 2022-10-07 ENCOUNTER — Other Ambulatory Visit (INDEPENDENT_AMBULATORY_CARE_PROVIDER_SITE_OTHER): Payer: Medicare HMO

## 2022-10-07 ENCOUNTER — Ambulatory Visit: Payer: Medicare HMO | Admitting: Physician Assistant

## 2022-10-07 VITALS — BP 114/81 | HR 111 | Resp 18 | Ht 60.0 in | Wt 149.0 lb

## 2022-10-07 DIAGNOSIS — R488 Other symbolic dysfunctions: Secondary | ICD-10-CM | POA: Diagnosis not present

## 2022-10-07 DIAGNOSIS — R413 Other amnesia: Secondary | ICD-10-CM

## 2022-10-07 DIAGNOSIS — M62561 Muscle wasting and atrophy, not elsewhere classified, right lower leg: Secondary | ICD-10-CM | POA: Diagnosis not present

## 2022-10-07 DIAGNOSIS — M62552 Muscle wasting and atrophy, not elsewhere classified, left thigh: Secondary | ICD-10-CM | POA: Diagnosis not present

## 2022-10-07 DIAGNOSIS — R2681 Unsteadiness on feet: Secondary | ICD-10-CM | POA: Diagnosis not present

## 2022-10-07 DIAGNOSIS — Z9181 History of falling: Secondary | ICD-10-CM | POA: Diagnosis not present

## 2022-10-07 DIAGNOSIS — M62562 Muscle wasting and atrophy, not elsewhere classified, left lower leg: Secondary | ICD-10-CM | POA: Diagnosis not present

## 2022-10-07 DIAGNOSIS — M62551 Muscle wasting and atrophy, not elsewhere classified, right thigh: Secondary | ICD-10-CM | POA: Diagnosis not present

## 2022-10-07 DIAGNOSIS — R2689 Other abnormalities of gait and mobility: Secondary | ICD-10-CM | POA: Diagnosis not present

## 2022-10-07 DIAGNOSIS — R41841 Cognitive communication deficit: Secondary | ICD-10-CM | POA: Diagnosis not present

## 2022-10-07 NOTE — Patient Instructions (Addendum)
It was a pleasure to see you today at our office.   Recommendations:  Neurocognitive evaluation at our office MRI of the brain, the radiology office will call you to arrange you appointment Check labs today Follow up in   Whom to call:  Memory  decline, memory medications: Call our office [phone]   For psychiatric meds, mood meds: Please have your primary care physician manage these medications.      For assessment of decision of mental capacity and competency:  Call Dr. Anthoney Harada, geriatric psychiatrist at [phone]  For guidance in geriatric dementia issues please call [facility] [phone]   If you have any severe symptoms of a stroke, or other severe issues such as confusion,severe chills or fever, etc call 911 or go to the ER as you may need to be evaluated further   Feel free to visit Facebook page " [email or web address]" for tips of how to care for people with memory problems.       RECOMMENDATIONS FOR ALL PATIENTS WITH MEMORY PROBLEMS: 1. Continue to exercise (Recommend 30 minutes of walking everyday, or 3 hours every week) 2. Increase social interactions - continue going to [place] and enjoy social gatherings with friends and family 3. Eat healthy, avoid fried foods and eat more fruits and vegetables 4. Maintain adequate blood pressure, blood sugar, and blood cholesterol level. Reducing the risk of stroke and cardiovascular disease also helps promoting better memory. 5. Avoid stressful situations. Live a simple life and avoid aggravations. Organize your time and prepare for the next day in anticipation. 6. Sleep well, avoid any interruptions of sleep and avoid any distractions in the bedroom that may interfere with adequate sleep quality 7. Avoid sugar, avoid sweets as there is a strong link between excessive sugar intake, diabetes, and cognitive impairment We discussed the Mediterranean diet, which has been shown to help patients reduce the risk of  progressive memory disorders and reduces cardiovascular risk. This includes eating fish, eat fruits and green leafy vegetables, nuts like almonds and hazelnuts, walnuts, and also use olive oil. Avoid fast foods and fried foods as much as possible. Avoid sweets and sugar as sugar use has been linked to worsening of memory function.  There is always a concern of gradual progression of memory problems. If this is the case, then we may need to adjust level of care according to patient needs. Support, both to the patient and caregiver, should then be put into place.      You have been referred for a neuropsychological evaluation (i.e., evaluation of memory and thinking abilities). Please bring someone with you to this appointment if possible, as it is helpful for the doctor to hear from both you and another adult who knows you well. Please bring eyeglasses and hearing aids if you wear them.    The evaluation will take approximately 3 hours and has two parts:   The first part is a clinical interview with the neuropsychologist (Dr. Melvyn Novas or Dr. Nicole Kindred). During the interview, the neuropsychologist will speak with you and the individual you brought to the appointment.    The second part of the evaluation is testing with the doctor's technician Hinton Dyer or Maudie Mercury). During the testing, the technician will ask you to remember different types of material, solve problems, and answer some questionnaires. Your family member will not be present for this portion of the evaluation.   Please note: We must reserve several hours of the neuropsychologist's time and the psychometrician's time for your  evaluation appointment. As such, there is a No-Show fee of $100. If you are unable to attend any of your appointments, please contact our office as soon as possible to reschedule.    FALL PRECAUTIONS: Be cautious when walking. Scan the area for obstacles that may increase the risk of trips and falls. When getting up in the  mornings, sit up at the edge of the bed for a few minutes before getting out of bed. Consider elevating the bed at the head end to avoid drop of blood pressure when getting up. Walk always in a well-lit room (use night lights in the walls). Avoid area rugs or power cords from appliances in the middle of the walkways. Use a walker or a cane if necessary and consider physical therapy for balance exercise. Get your eyesight checked regularly.  FINANCIAL OVERSIGHT: Supervision, especially oversight when making financial decisions or transactions is also recommended.  HOME SAFETY: Consider the safety of the kitchen when operating appliances like stoves, microwave oven, and blender. Consider having supervision and share cooking responsibilities until no longer able to participate in those. Accidents with firearms and other hazards in the house should be identified and addressed as well.   ABILITY TO BE LEFT ALONE: If patient is unable to contact 911 operator, consider using LifeLine, or when the need is there, arrange for someone to stay with patients. Smoking is a fire hazard, consider supervision or cessation. Risk of wandering should be assessed by caregiver and if detected at any point, supervision and safe proof recommendations should be instituted.  MEDICATION SUPERVISION: Inability to self-administer medication needs to be constantly addressed. Implement a mechanism to ensure safe administration of the medications.   DRIVING: Regarding driving, in patients with progressive memory problems, driving will be impaired. We advise to have someone else do the driving if trouble finding directions or if minor accidents are reported. Independent driving assessment is available to determine safety of driving.   If you are interested in the driving assessment, you can contact the following:  [employer] in [place]  [place] [phone] or [phone]    [place] refers to food and lifestyle choices that are based on the traditions of countries located on the [employer]. This way of eating has been shown to help prevent certain conditions and improve outcomes for people who have chronic diseases, like kidney disease and heart disease. What are tips for following this plan? Lifestyle  Cook and eat meals together with your family, when possible. Drink enough fluid to keep your urine clear or pale yellow. Be physically active every day. This includes: Aerobic exercise like running or swimming. Leisure activities like gardening, walking, or housework. Get 7-8 hours of sleep each night. If recommended by your health care provider, drink red wine in moderation. This means 1 glass a day for nonpregnant women and 2 glasses a day for men. A glass of wine equals 5 oz (150 mL). Reading food labels  Check the serving size of packaged foods. For foods such as rice and pasta, the serving size refers to the amount of cooked product, not dry. Check the total fat in packaged foods. Avoid foods that have saturated fat or trans fats. Check the ingredients list for added sugars, such as corn syrup. Shopping  At the grocery store, buy most of your food from the areas near the walls of the store. This includes:  Fresh fruits and vegetables (produce). Grains, beans, nuts, and seeds. Some of these may be available in unpackaged forms or large amounts (in bulk). Fresh seafood. Poultry and eggs. Low-fat dairy products. Buy whole ingredients instead of prepackaged foods. Buy fresh fruits and vegetables in-season from local farmers markets. Buy frozen fruits and vegetables in resealable bags. If you do not have access to quality fresh seafood, buy precooked frozen shrimp or canned fish, such as tuna, salmon, or sardines. Buy small amounts of raw or cooked  vegetables, salads, or olives from the deli or salad bar at your store. Stock your pantry so you always have certain foods on hand, such as olive oil, canned tuna, canned tomatoes, rice, pasta, and beans. Cooking  Cook foods with extra-virgin olive oil instead of using butter or other vegetable oils. Have meat as a side dish, and have vegetables or grains as your main dish. This means having meat in small portions or adding small amounts of meat to foods like pasta or stew. Use beans or vegetables instead of meat in common dishes like chili or lasagna. Experiment with different cooking methods. Try roasting or broiling vegetables instead of steaming or sauteing them. Add frozen vegetables to soups, stews, pasta, or rice. Add nuts or seeds for added healthy fat at each meal. You can add these to yogurt, salads, or vegetable dishes. Marinate fish or vegetables using olive oil, lemon juice, garlic, and fresh herbs. Meal planning  Plan to eat 1 vegetarian meal one day each week. Try to work up to 2 vegetarian meals, if possible. Eat seafood 2 or more times a week. Have healthy snacks readily available, such as: Vegetable sticks with hummus. Greek yogurt. Fruit and nut trail mix. Eat balanced meals throughout the week. This includes: Fruit: 2-3 servings a day Vegetables: 4-5 servings a day Low-fat dairy: 2 servings a day Fish, poultry, or lean meat: 1 serving a day Beans and legumes: 2 or more servings a week Nuts and seeds: 1-2 servings a day Whole grains: 6-8 servings a day Extra-virgin olive oil: 3-4 servings a day Limit red meat and sweets to only a few servings a month What are my food choices? Mediterranean diet Recommended Grains: Whole-grain pasta. Brown rice. Bulgar wheat. Polenta. Couscous. Whole-wheat bread. [place]. Vegetables: Artichokes. Beets. Broccoli. Cabbage. Carrots. Eggplant. Green beans. Chard. Kale. Spinach. Onions. Leeks. Peas. Squash. Tomatoes. Peppers.  Radishes. Fruits: Apples. Apricots. Avocado. Berries. Bananas. Cherries. Dates. Figs. Grapes. Lemons. Melon. Oranges. Peaches. Plums. Pomegranate. Meats and other protein foods: Beans. Almonds. Sunflower seeds. Pine nuts. Peanuts. [place]. Salmon. Scallops. Shrimp. [place]. Tilapia. Clams. Oysters. Eggs. Dairy: Low-fat milk. Cheese. Greek yogurt. Beverages: Water. Red wine. Herbal tea. Fats and oils: Extra virgin olive oil. Avocado oil. Grape seed oil. Sweets and desserts: [place] yogurt with honey. Baked apples. Poached pears. Trail mix. Seasoning and other foods: Basil. Cilantro. Coriander. Cumin. Mint. Parsley. Sage. Rosemary. Tarragon. Garlic. Oregano. Thyme. Pepper. Balsalmic vinegar. Tahini. Hummus. Tomato sauce. Olives. Mushrooms. Limit these Grains: Prepackaged pasta or rice dishes. Prepackaged cereal with added sugar. Vegetables: Deep fried potatoes (french fries). Fruits: Fruit canned in syrup. Meats and other protein foods: Beef. Pork. Lamb. Poultry with skin. Hot dogs. Berniece Salines. Dairy: Ice cream. Sour cream. Whole milk. Beverages: Juice. Sugar-sweetened soft drinks. Beer. Liquor and spirits. Fats and oils: Butter. Canola oil. Vegetable oil. Beef fat (tallow). Lard. Sweets and desserts: Cookies. Cakes. Pies. Candy. Seasoning and other foods: Mayonnaise. Premade sauces and marinades. The items listed may not be a complete  list. Talk with your dietitian about what dietary choices are right for you. Summary The Mediterranean diet includes both food and lifestyle choices. Eat a variety of fresh fruits and vegetables, beans, nuts, seeds, and whole grains. Limit the amount of red meat and sweets that you eat. Talk with your health care provider about whether it is safe for you to drink red wine in moderation. This means 1 glass a day for nonpregnant women and 2 glasses a day for men. A glass of wine equals 5 oz (150 mL). This information is not intended to replace advice given to you by your health  care provider. Make sure you discuss any questions you have with your health care provider. Document Released: 03/20/2016 Document Revised: 04/22/2016 Document Reviewed: 03/20/2016 Elsevier Interactive Patient Education  2017 [place] [address] [place] [phone]
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
## 2022-10-08 DIAGNOSIS — R488 Other symbolic dysfunctions: Secondary | ICD-10-CM | POA: Diagnosis not present

## 2022-10-08 DIAGNOSIS — R41841 Cognitive communication deficit: Secondary | ICD-10-CM | POA: Diagnosis not present

## 2022-10-08 DIAGNOSIS — M62552 Muscle wasting and atrophy, not elsewhere classified, left thigh: Secondary | ICD-10-CM | POA: Diagnosis not present

## 2022-10-08 DIAGNOSIS — R2681 Unsteadiness on feet: Secondary | ICD-10-CM | POA: Diagnosis not present

## 2022-10-08 DIAGNOSIS — R2689 Other abnormalities of gait and mobility: Secondary | ICD-10-CM | POA: Diagnosis not present

## 2022-10-08 DIAGNOSIS — M62562 Muscle wasting and atrophy, not elsewhere classified, left lower leg: Secondary | ICD-10-CM | POA: Diagnosis not present

## 2022-10-08 DIAGNOSIS — M62551 Muscle wasting and atrophy, not elsewhere classified, right thigh: Secondary | ICD-10-CM | POA: Diagnosis not present

## 2022-10-08 DIAGNOSIS — M62561 Muscle wasting and atrophy, not elsewhere classified, right lower leg: Secondary | ICD-10-CM | POA: Diagnosis not present

## 2022-10-08 DIAGNOSIS — Z9181 History of falling: Secondary | ICD-10-CM | POA: Diagnosis not present

## 2022-10-08 LAB — VITAMIN B12: Vitamin B-12: 271 pg/mL (ref 211–911)

## 2022-10-08 LAB — TSH: TSH: 3.2 u[IU]/mL (ref 0.35–5.50)

## 2022-10-08 NOTE — Progress Notes (Signed)
Thyroid levels are normal, B12 is on the lower side at 271, will like it between 400 and 1000, please start taking B12 1000 mcg daily and follow-up with family doctor.  Thank you

## 2022-10-09 DIAGNOSIS — M62562 Muscle wasting and atrophy, not elsewhere classified, left lower leg: Secondary | ICD-10-CM | POA: Diagnosis not present

## 2022-10-09 DIAGNOSIS — R2681 Unsteadiness on feet: Secondary | ICD-10-CM | POA: Diagnosis not present

## 2022-10-09 DIAGNOSIS — R2689 Other abnormalities of gait and mobility: Secondary | ICD-10-CM | POA: Diagnosis not present

## 2022-10-09 DIAGNOSIS — R41841 Cognitive communication deficit: Secondary | ICD-10-CM | POA: Diagnosis not present

## 2022-10-09 DIAGNOSIS — M62561 Muscle wasting and atrophy, not elsewhere classified, right lower leg: Secondary | ICD-10-CM | POA: Diagnosis not present

## 2022-10-09 DIAGNOSIS — R488 Other symbolic dysfunctions: Secondary | ICD-10-CM | POA: Diagnosis not present

## 2022-10-09 DIAGNOSIS — Z9181 History of falling: Secondary | ICD-10-CM | POA: Diagnosis not present

## 2022-10-09 DIAGNOSIS — M62551 Muscle wasting and atrophy, not elsewhere classified, right thigh: Secondary | ICD-10-CM | POA: Diagnosis not present

## 2022-10-09 DIAGNOSIS — M62552 Muscle wasting and atrophy, not elsewhere classified, left thigh: Secondary | ICD-10-CM | POA: Diagnosis not present

## 2022-10-10 DIAGNOSIS — R2689 Other abnormalities of gait and mobility: Secondary | ICD-10-CM | POA: Diagnosis not present

## 2022-10-10 DIAGNOSIS — Z9181 History of falling: Secondary | ICD-10-CM | POA: Diagnosis not present

## 2022-10-10 DIAGNOSIS — M62552 Muscle wasting and atrophy, not elsewhere classified, left thigh: Secondary | ICD-10-CM | POA: Diagnosis not present

## 2022-10-10 DIAGNOSIS — M62562 Muscle wasting and atrophy, not elsewhere classified, left lower leg: Secondary | ICD-10-CM | POA: Diagnosis not present

## 2022-10-10 DIAGNOSIS — R488 Other symbolic dysfunctions: Secondary | ICD-10-CM | POA: Diagnosis not present

## 2022-10-10 DIAGNOSIS — R2681 Unsteadiness on feet: Secondary | ICD-10-CM | POA: Diagnosis not present

## 2022-10-10 DIAGNOSIS — M62551 Muscle wasting and atrophy, not elsewhere classified, right thigh: Secondary | ICD-10-CM | POA: Diagnosis not present

## 2022-10-10 DIAGNOSIS — R41841 Cognitive communication deficit: Secondary | ICD-10-CM | POA: Diagnosis not present

## 2022-10-10 DIAGNOSIS — M62561 Muscle wasting and atrophy, not elsewhere classified, right lower leg: Secondary | ICD-10-CM | POA: Diagnosis not present

## 2022-10-12 DIAGNOSIS — Z9181 History of falling: Secondary | ICD-10-CM | POA: Diagnosis not present

## 2022-10-12 DIAGNOSIS — R488 Other symbolic dysfunctions: Secondary | ICD-10-CM | POA: Diagnosis not present

## 2022-10-12 DIAGNOSIS — M62562 Muscle wasting and atrophy, not elsewhere classified, left lower leg: Secondary | ICD-10-CM | POA: Diagnosis not present

## 2022-10-12 DIAGNOSIS — M62561 Muscle wasting and atrophy, not elsewhere classified, right lower leg: Secondary | ICD-10-CM | POA: Diagnosis not present

## 2022-10-12 DIAGNOSIS — R2689 Other abnormalities of gait and mobility: Secondary | ICD-10-CM | POA: Diagnosis not present

## 2022-10-12 DIAGNOSIS — M62551 Muscle wasting and atrophy, not elsewhere classified, right thigh: Secondary | ICD-10-CM | POA: Diagnosis not present

## 2022-10-12 DIAGNOSIS — R41841 Cognitive communication deficit: Secondary | ICD-10-CM | POA: Diagnosis not present

## 2022-10-12 DIAGNOSIS — R2681 Unsteadiness on feet: Secondary | ICD-10-CM | POA: Diagnosis not present

## 2022-10-12 DIAGNOSIS — M62552 Muscle wasting and atrophy, not elsewhere classified, left thigh: Secondary | ICD-10-CM | POA: Diagnosis not present

## 2022-10-13 DIAGNOSIS — R488 Other symbolic dysfunctions: Secondary | ICD-10-CM | POA: Diagnosis not present

## 2022-10-13 DIAGNOSIS — M62552 Muscle wasting and atrophy, not elsewhere classified, left thigh: Secondary | ICD-10-CM | POA: Diagnosis not present

## 2022-10-13 DIAGNOSIS — R2689 Other abnormalities of gait and mobility: Secondary | ICD-10-CM | POA: Diagnosis not present

## 2022-10-13 DIAGNOSIS — M62562 Muscle wasting and atrophy, not elsewhere classified, left lower leg: Secondary | ICD-10-CM | POA: Diagnosis not present

## 2022-10-13 DIAGNOSIS — M62561 Muscle wasting and atrophy, not elsewhere classified, right lower leg: Secondary | ICD-10-CM | POA: Diagnosis not present

## 2022-10-13 DIAGNOSIS — Z9181 History of falling: Secondary | ICD-10-CM | POA: Diagnosis not present

## 2022-10-13 DIAGNOSIS — R2681 Unsteadiness on feet: Secondary | ICD-10-CM | POA: Diagnosis not present

## 2022-10-13 DIAGNOSIS — M62551 Muscle wasting and atrophy, not elsewhere classified, right thigh: Secondary | ICD-10-CM | POA: Diagnosis not present

## 2022-10-13 DIAGNOSIS — R41841 Cognitive communication deficit: Secondary | ICD-10-CM | POA: Diagnosis not present

## 2022-10-14 ENCOUNTER — Encounter: Payer: Medicare PPO | Admitting: Family Medicine

## 2022-10-15 DIAGNOSIS — M62551 Muscle wasting and atrophy, not elsewhere classified, right thigh: Secondary | ICD-10-CM | POA: Diagnosis not present

## 2022-10-15 DIAGNOSIS — Z9181 History of falling: Secondary | ICD-10-CM | POA: Diagnosis not present

## 2022-10-15 DIAGNOSIS — R488 Other symbolic dysfunctions: Secondary | ICD-10-CM | POA: Diagnosis not present

## 2022-10-15 DIAGNOSIS — R2689 Other abnormalities of gait and mobility: Secondary | ICD-10-CM | POA: Diagnosis not present

## 2022-10-15 DIAGNOSIS — R2681 Unsteadiness on feet: Secondary | ICD-10-CM | POA: Diagnosis not present

## 2022-10-15 DIAGNOSIS — M62562 Muscle wasting and atrophy, not elsewhere classified, left lower leg: Secondary | ICD-10-CM | POA: Diagnosis not present

## 2022-10-15 DIAGNOSIS — R41841 Cognitive communication deficit: Secondary | ICD-10-CM | POA: Diagnosis not present

## 2022-10-15 DIAGNOSIS — M62561 Muscle wasting and atrophy, not elsewhere classified, right lower leg: Secondary | ICD-10-CM | POA: Diagnosis not present

## 2022-10-15 DIAGNOSIS — M62552 Muscle wasting and atrophy, not elsewhere classified, left thigh: Secondary | ICD-10-CM | POA: Diagnosis not present

## 2022-10-16 ENCOUNTER — Other Ambulatory Visit: Payer: Self-pay | Admitting: Family Medicine

## 2022-10-16 DIAGNOSIS — M62562 Muscle wasting and atrophy, not elsewhere classified, left lower leg: Secondary | ICD-10-CM | POA: Diagnosis not present

## 2022-10-16 DIAGNOSIS — R41841 Cognitive communication deficit: Secondary | ICD-10-CM | POA: Diagnosis not present

## 2022-10-16 DIAGNOSIS — R2689 Other abnormalities of gait and mobility: Secondary | ICD-10-CM | POA: Diagnosis not present

## 2022-10-16 DIAGNOSIS — M62552 Muscle wasting and atrophy, not elsewhere classified, left thigh: Secondary | ICD-10-CM | POA: Diagnosis not present

## 2022-10-16 DIAGNOSIS — R2681 Unsteadiness on feet: Secondary | ICD-10-CM | POA: Diagnosis not present

## 2022-10-16 DIAGNOSIS — Z9181 History of falling: Secondary | ICD-10-CM | POA: Diagnosis not present

## 2022-10-16 DIAGNOSIS — M62551 Muscle wasting and atrophy, not elsewhere classified, right thigh: Secondary | ICD-10-CM | POA: Diagnosis not present

## 2022-10-16 DIAGNOSIS — R488 Other symbolic dysfunctions: Secondary | ICD-10-CM | POA: Diagnosis not present

## 2022-10-16 DIAGNOSIS — M62561 Muscle wasting and atrophy, not elsewhere classified, right lower leg: Secondary | ICD-10-CM | POA: Diagnosis not present

## 2022-10-17 ENCOUNTER — Telehealth: Payer: Self-pay

## 2022-10-17 DIAGNOSIS — R41841 Cognitive communication deficit: Secondary | ICD-10-CM | POA: Diagnosis not present

## 2022-10-17 DIAGNOSIS — M62551 Muscle wasting and atrophy, not elsewhere classified, right thigh: Secondary | ICD-10-CM | POA: Diagnosis not present

## 2022-10-17 DIAGNOSIS — R2681 Unsteadiness on feet: Secondary | ICD-10-CM | POA: Diagnosis not present

## 2022-10-17 DIAGNOSIS — M62552 Muscle wasting and atrophy, not elsewhere classified, left thigh: Secondary | ICD-10-CM | POA: Diagnosis not present

## 2022-10-17 DIAGNOSIS — R488 Other symbolic dysfunctions: Secondary | ICD-10-CM | POA: Diagnosis not present

## 2022-10-17 DIAGNOSIS — M62561 Muscle wasting and atrophy, not elsewhere classified, right lower leg: Secondary | ICD-10-CM | POA: Diagnosis not present

## 2022-10-17 DIAGNOSIS — R2689 Other abnormalities of gait and mobility: Secondary | ICD-10-CM | POA: Diagnosis not present

## 2022-10-17 DIAGNOSIS — Z9181 History of falling: Secondary | ICD-10-CM | POA: Diagnosis not present

## 2022-10-17 DIAGNOSIS — M62562 Muscle wasting and atrophy, not elsewhere classified, left lower leg: Secondary | ICD-10-CM | POA: Diagnosis not present

## 2022-10-17 NOTE — Telephone Encounter (Signed)
Called patient to schedule Medicare Annual Wellness Visit (AWV). Unable to reach patient.  Last date of AWV: 04/23/20  Please schedule an AWV-S appointment at any time with [place].    Norton Blizzard, [place] (AAMA)  [place] [facility] [phone]

## 2022-10-20 ENCOUNTER — Ambulatory Visit: Payer: Medicare HMO | Admitting: Family Medicine

## 2022-10-20 ENCOUNTER — Telehealth: Payer: Self-pay | Admitting: Pharmacist

## 2022-10-20 DIAGNOSIS — M62561 Muscle wasting and atrophy, not elsewhere classified, right lower leg: Secondary | ICD-10-CM | POA: Diagnosis not present

## 2022-10-20 DIAGNOSIS — R488 Other symbolic dysfunctions: Secondary | ICD-10-CM | POA: Diagnosis not present

## 2022-10-20 DIAGNOSIS — R41841 Cognitive communication deficit: Secondary | ICD-10-CM | POA: Diagnosis not present

## 2022-10-20 DIAGNOSIS — M62552 Muscle wasting and atrophy, not elsewhere classified, left thigh: Secondary | ICD-10-CM | POA: Diagnosis not present

## 2022-10-20 DIAGNOSIS — M62562 Muscle wasting and atrophy, not elsewhere classified, left lower leg: Secondary | ICD-10-CM | POA: Diagnosis not present

## 2022-10-20 DIAGNOSIS — Z9181 History of falling: Secondary | ICD-10-CM | POA: Diagnosis not present

## 2022-10-20 DIAGNOSIS — M62551 Muscle wasting and atrophy, not elsewhere classified, right thigh: Secondary | ICD-10-CM | POA: Diagnosis not present

## 2022-10-20 DIAGNOSIS — R2681 Unsteadiness on feet: Secondary | ICD-10-CM | POA: Diagnosis not present

## 2022-10-20 DIAGNOSIS — R2689 Other abnormalities of gait and mobility: Secondary | ICD-10-CM | POA: Diagnosis not present

## 2022-10-20 NOTE — Progress Notes (Signed)
Care Management & Coordination Services Pharmacy Team  Reason for Encounter: Appointment Reminder  Contacted patient to confirm telephone appointment with Leata Mouse, PharmD on 10/21/2022 at 12 pm. Spoke with patient on 10/20/2022    Star Rating Drugs:  Ezetimibe-simvastatin 10-20 mg last filled 10/15/2022 90 DS   Care Gaps: Annual wellness visit in last year? No   Future Appointments  Date Time Provider [place]  10/21/2022 12:00 PM Edythe Clarity, [place] None  10/21/2022  1:20 PM Inda Coke, [place] [facility]  10/23/2022  9:10 AM GI-315 MR 3 GI-315MRI GI-[address]  11/05/2022  1:00 PM Rondel Jumbo, PA-C [facility] None  01/27/2023  1:20 PM Marin Olp, MD [facility]  06/19/2023  8:30 AM Hazle Coca, PhD [facility] None  06/19/2023  9:30 AM [facility]- NEUROPSYCH TECH [facility] None  06/26/2023 10:00 AM Hazle Coca, PhD [facility] None   April D Calhoun, [place] Pharmacist Assistant [phone]

## 2022-10-21 ENCOUNTER — Ambulatory Visit: Payer: Medicare HMO | Admitting: Physician Assistant

## 2022-10-21 ENCOUNTER — Ambulatory Visit: Payer: Medicare PPO | Admitting: Pharmacist

## 2022-10-21 DIAGNOSIS — R41841 Cognitive communication deficit: Secondary | ICD-10-CM | POA: Diagnosis not present

## 2022-10-21 DIAGNOSIS — Z9181 History of falling: Secondary | ICD-10-CM | POA: Diagnosis not present

## 2022-10-21 DIAGNOSIS — M62561 Muscle wasting and atrophy, not elsewhere classified, right lower leg: Secondary | ICD-10-CM | POA: Diagnosis not present

## 2022-10-21 DIAGNOSIS — R488 Other symbolic dysfunctions: Secondary | ICD-10-CM | POA: Diagnosis not present

## 2022-10-21 DIAGNOSIS — M62562 Muscle wasting and atrophy, not elsewhere classified, left lower leg: Secondary | ICD-10-CM | POA: Diagnosis not present

## 2022-10-21 DIAGNOSIS — R2681 Unsteadiness on feet: Secondary | ICD-10-CM | POA: Diagnosis not present

## 2022-10-21 DIAGNOSIS — R2689 Other abnormalities of gait and mobility: Secondary | ICD-10-CM | POA: Diagnosis not present

## 2022-10-21 DIAGNOSIS — M62552 Muscle wasting and atrophy, not elsewhere classified, left thigh: Secondary | ICD-10-CM | POA: Diagnosis not present

## 2022-10-21 DIAGNOSIS — M62551 Muscle wasting and atrophy, not elsewhere classified, right thigh: Secondary | ICD-10-CM | POA: Diagnosis not present
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
## 2022-10-22 DIAGNOSIS — M62551 Muscle wasting and atrophy, not elsewhere classified, right thigh: Secondary | ICD-10-CM | POA: Diagnosis not present

## 2022-10-22 DIAGNOSIS — M62552 Muscle wasting and atrophy, not elsewhere classified, left thigh: Secondary | ICD-10-CM | POA: Diagnosis not present

## 2022-10-22 DIAGNOSIS — Z9181 History of falling: Secondary | ICD-10-CM | POA: Diagnosis not present

## 2022-10-22 DIAGNOSIS — R2681 Unsteadiness on feet: Secondary | ICD-10-CM | POA: Diagnosis not present

## 2022-10-22 DIAGNOSIS — R488 Other symbolic dysfunctions: Secondary | ICD-10-CM | POA: Diagnosis not present

## 2022-10-22 DIAGNOSIS — R41841 Cognitive communication deficit: Secondary | ICD-10-CM | POA: Diagnosis not present

## 2022-10-22 DIAGNOSIS — M62562 Muscle wasting and atrophy, not elsewhere classified, left lower leg: Secondary | ICD-10-CM | POA: Diagnosis not present

## 2022-10-22 DIAGNOSIS — R2689 Other abnormalities of gait and mobility: Secondary | ICD-10-CM | POA: Diagnosis not present

## 2022-10-22 DIAGNOSIS — M62561 Muscle wasting and atrophy, not elsewhere classified, right lower leg: Secondary | ICD-10-CM | POA: Diagnosis not present

## 2022-10-23 ENCOUNTER — Ambulatory Visit
Admission: RE | Admit: 2022-10-23 | Discharge: 2022-10-23 | Disposition: A | Discharge status: Home / Self Care | Payer: Medicare HMO | Source: Ambulatory Visit | Attending: Family Medicine | Admitting: Family Medicine

## 2022-10-23 DIAGNOSIS — R2681 Unsteadiness on feet: Secondary | ICD-10-CM | POA: Diagnosis not present

## 2022-10-23 DIAGNOSIS — M62562 Muscle wasting and atrophy, not elsewhere classified, left lower leg: Secondary | ICD-10-CM | POA: Diagnosis not present

## 2022-10-23 DIAGNOSIS — R488 Other symbolic dysfunctions: Secondary | ICD-10-CM | POA: Diagnosis not present

## 2022-10-23 DIAGNOSIS — K769 Liver disease, unspecified: Secondary | ICD-10-CM

## 2022-10-23 DIAGNOSIS — R2689 Other abnormalities of gait and mobility: Secondary | ICD-10-CM | POA: Diagnosis not present

## 2022-10-23 DIAGNOSIS — M62552 Muscle wasting and atrophy, not elsewhere classified, left thigh: Secondary | ICD-10-CM | POA: Diagnosis not present

## 2022-10-23 DIAGNOSIS — R41841 Cognitive communication deficit: Secondary | ICD-10-CM | POA: Diagnosis not present

## 2022-10-23 DIAGNOSIS — M62561 Muscle wasting and atrophy, not elsewhere classified, right lower leg: Secondary | ICD-10-CM | POA: Diagnosis not present

## 2022-10-23 DIAGNOSIS — M62551 Muscle wasting and atrophy, not elsewhere classified, right thigh: Secondary | ICD-10-CM | POA: Diagnosis not present

## 2022-10-23 DIAGNOSIS — Z9181 History of falling: Secondary | ICD-10-CM | POA: Diagnosis not present

## 2022-10-23 MED ORDER — GADOPICLENOL 0.5 MMOL/ML IV SOLN
6.0000 mL | Freq: Once | INTRAVENOUS | Status: AC | PRN
  Administered 2022-10-23: 6 mL via INTRAVENOUS

## 2022-10-24 DIAGNOSIS — R488 Other symbolic dysfunctions: Secondary | ICD-10-CM | POA: Diagnosis not present

## 2022-10-24 DIAGNOSIS — M62551 Muscle wasting and atrophy, not elsewhere classified, right thigh: Secondary | ICD-10-CM | POA: Diagnosis not present

## 2022-10-24 DIAGNOSIS — M62561 Muscle wasting and atrophy, not elsewhere classified, right lower leg: Secondary | ICD-10-CM | POA: Diagnosis not present

## 2022-10-24 DIAGNOSIS — R2689 Other abnormalities of gait and mobility: Secondary | ICD-10-CM | POA: Diagnosis not present

## 2022-10-24 DIAGNOSIS — M62552 Muscle wasting and atrophy, not elsewhere classified, left thigh: Secondary | ICD-10-CM | POA: Diagnosis not present

## 2022-10-24 DIAGNOSIS — Z9181 History of falling: Secondary | ICD-10-CM | POA: Diagnosis not present

## 2022-10-24 DIAGNOSIS — R41841 Cognitive communication deficit: Secondary | ICD-10-CM | POA: Diagnosis not present

## 2022-10-24 DIAGNOSIS — R2681 Unsteadiness on feet: Secondary | ICD-10-CM | POA: Diagnosis not present

## 2022-10-24 DIAGNOSIS — M62562 Muscle wasting and atrophy, not elsewhere classified, left lower leg: Secondary | ICD-10-CM | POA: Diagnosis not present

## 2022-10-27 DIAGNOSIS — M62552 Muscle wasting and atrophy, not elsewhere classified, left thigh: Secondary | ICD-10-CM | POA: Diagnosis not present

## 2022-10-27 DIAGNOSIS — R41841 Cognitive communication deficit: Secondary | ICD-10-CM | POA: Diagnosis not present

## 2022-10-27 DIAGNOSIS — M62561 Muscle wasting and atrophy, not elsewhere classified, right lower leg: Secondary | ICD-10-CM | POA: Diagnosis not present

## 2022-10-27 DIAGNOSIS — M62551 Muscle wasting and atrophy, not elsewhere classified, right thigh: Secondary | ICD-10-CM | POA: Diagnosis not present

## 2022-10-27 DIAGNOSIS — R488 Other symbolic dysfunctions: Secondary | ICD-10-CM | POA: Diagnosis not present

## 2022-10-27 DIAGNOSIS — R2689 Other abnormalities of gait and mobility: Secondary | ICD-10-CM | POA: Diagnosis not present

## 2022-10-27 DIAGNOSIS — M62562 Muscle wasting and atrophy, not elsewhere classified, left lower leg: Secondary | ICD-10-CM | POA: Diagnosis not present

## 2022-10-27 DIAGNOSIS — R2681 Unsteadiness on feet: Secondary | ICD-10-CM | POA: Diagnosis not present

## 2022-10-27 DIAGNOSIS — Z9181 History of falling: Secondary | ICD-10-CM | POA: Diagnosis not present

## 2022-10-28 DIAGNOSIS — M62551 Muscle wasting and atrophy, not elsewhere classified, right thigh: Secondary | ICD-10-CM | POA: Diagnosis not present

## 2022-10-28 DIAGNOSIS — M62561 Muscle wasting and atrophy, not elsewhere classified, right lower leg: Secondary | ICD-10-CM | POA: Diagnosis not present

## 2022-10-28 DIAGNOSIS — M62552 Muscle wasting and atrophy, not elsewhere classified, left thigh: Secondary | ICD-10-CM | POA: Diagnosis not present

## 2022-10-28 DIAGNOSIS — R2689 Other abnormalities of gait and mobility: Secondary | ICD-10-CM | POA: Diagnosis not present

## 2022-10-28 DIAGNOSIS — Z9181 History of falling: Secondary | ICD-10-CM | POA: Diagnosis not present

## 2022-10-28 DIAGNOSIS — M62562 Muscle wasting and atrophy, not elsewhere classified, left lower leg: Secondary | ICD-10-CM | POA: Diagnosis not present

## 2022-10-28 DIAGNOSIS — R41841 Cognitive communication deficit: Secondary | ICD-10-CM | POA: Diagnosis not present

## 2022-10-28 DIAGNOSIS — R488 Other symbolic dysfunctions: Secondary | ICD-10-CM | POA: Diagnosis not present

## 2022-10-28 DIAGNOSIS — R2681 Unsteadiness on feet: Secondary | ICD-10-CM | POA: Diagnosis not present

## 2022-10-29 DIAGNOSIS — M62552 Muscle wasting and atrophy, not elsewhere classified, left thigh: Secondary | ICD-10-CM | POA: Diagnosis not present

## 2022-10-29 DIAGNOSIS — Z9181 History of falling: Secondary | ICD-10-CM | POA: Diagnosis not present

## 2022-10-29 DIAGNOSIS — R2681 Unsteadiness on feet: Secondary | ICD-10-CM | POA: Diagnosis not present

## 2022-10-29 DIAGNOSIS — R488 Other symbolic dysfunctions: Secondary | ICD-10-CM | POA: Diagnosis not present

## 2022-10-29 DIAGNOSIS — R2689 Other abnormalities of gait and mobility: Secondary | ICD-10-CM | POA: Diagnosis not present

## 2022-10-29 DIAGNOSIS — M62561 Muscle wasting and atrophy, not elsewhere classified, right lower leg: Secondary | ICD-10-CM | POA: Diagnosis not present

## 2022-10-29 DIAGNOSIS — M62551 Muscle wasting and atrophy, not elsewhere classified, right thigh: Secondary | ICD-10-CM | POA: Diagnosis not present

## 2022-10-29 DIAGNOSIS — M62562 Muscle wasting and atrophy, not elsewhere classified, left lower leg: Secondary | ICD-10-CM | POA: Diagnosis not present

## 2022-10-29 DIAGNOSIS — R41841 Cognitive communication deficit: Secondary | ICD-10-CM | POA: Diagnosis not present

## 2022-10-30 ENCOUNTER — Telehealth: Payer: Self-pay | Admitting: Family Medicine

## 2022-10-30 DIAGNOSIS — R41841 Cognitive communication deficit: Secondary | ICD-10-CM | POA: Diagnosis not present

## 2022-10-30 DIAGNOSIS — R2681 Unsteadiness on feet: Secondary | ICD-10-CM | POA: Diagnosis not present

## 2022-10-30 DIAGNOSIS — R2689 Other abnormalities of gait and mobility: Secondary | ICD-10-CM | POA: Diagnosis not present

## 2022-10-30 DIAGNOSIS — Z9181 History of falling: Secondary | ICD-10-CM | POA: Diagnosis not present

## 2022-10-30 DIAGNOSIS — M62552 Muscle wasting and atrophy, not elsewhere classified, left thigh: Secondary | ICD-10-CM | POA: Diagnosis not present

## 2022-10-30 DIAGNOSIS — M62551 Muscle wasting and atrophy, not elsewhere classified, right thigh: Secondary | ICD-10-CM | POA: Diagnosis not present

## 2022-10-30 DIAGNOSIS — M62562 Muscle wasting and atrophy, not elsewhere classified, left lower leg: Secondary | ICD-10-CM | POA: Diagnosis not present

## 2022-10-30 DIAGNOSIS — M62561 Muscle wasting and atrophy, not elsewhere classified, right lower leg: Secondary | ICD-10-CM | POA: Diagnosis not present

## 2022-10-30 DIAGNOSIS — R488 Other symbolic dysfunctions: Secondary | ICD-10-CM | POA: Diagnosis not present

## 2022-10-30 NOTE — Telephone Encounter (Signed)
Patient states: - Been experiencing lower abdominal pain (near pelvic region) x 2 weeks  - Pain is described as stinging sensation; has worsened as time went on - Pain is greatest when peeing   Patient has been transferred to triage.

## 2022-10-30 NOTE — Telephone Encounter (Signed)
Booker advised to see PCP within 24 hours. Due to transportation issues, Booker only able to come in on Monday. Booker has been scheduled for 11/03/22 @ 10am w/ Jeanie Sewer.   Patient Name: [name] Gender: Female DOB: 1936-05-30 Age: 87 Y 16 D Return Phone Number: [phone] (Primary), [phone] (Secondary) Address: City/ State/ Zip: [place]  [address] [facility] [place] [facility] [place] [facility] [place] [facility] [place] [facility] Provider Garret Reddish- MD Contact Type Call Who Is Calling Patient / Member / Family / Caregiver Call Type Triage / Clinical Relationship To Patient Self Return Phone Number [phone] (Secondary) Chief Complaint SEVERE ABDOMINAL PAIN - Severe pain in abdomen Reason for Call Symptomatic / Request for [name] states she has severe lower abdominal pain near her pelvic area for 2 weeks now. She also states it hurt worse when she is urinating. Translation No Nurse Assessment Nurse: Lenon Curt, RN, Melanie Date/Time (Eastern Time): 10/30/2022 11:49:18 AM Confirm and document reason for call. If symptomatic, describe symptoms. ---Caller states has severe lower abdominal pain near pelvic area for 2 weeks now when urinating. Smells. Does the patient have any new or worsening symptoms? ---Yes Will a triage be completed? ---Yes Related visit to physician within the last 2 weeks? ---No Does the Booker have any chronic conditions? (i.e. diabetes, asthma, this includes High risk factors for pregnancy, etc.) ---Yes Is this a behavioral health or substance abuse call? ---No Guidelines Guideline Title Affirmed Question Affirmed Notes Nurse Date/Time Eilene Ghazi Time) Urination Pain - Female Age > 67 years Lenon Curt, Archivist 10/30/2022 11:51:01 AM Disp. Time Eilene Ghazi Time) Disposition Final User 10/30/2022 11:44:20 AM Send to Urgent Gomez Cleverly 10/30/2022 11:54:01 AM See PCP  within 24 Hours Yes Lenon Curt, RN, Threasa Beards PLEASE NOTE: All timestamps contained within this report are represented as [place] Standard Time. CONFIDENTIALTY NOTICE: This fax transmission is intended only for the addressee. It contains information that is legally privileged, confidential or otherwise protected from use or disclosure. If you are not the intended recipient, you are strictly prohibited from reviewing, disclosing, copying using or disseminating any of this information or taking any action in reliance on or regarding this information. If you have received this fax in error, please notify us immediately by telephone so that we can arrange for its return to [place]. Phone: [phone], Toll-Free: [phone], Fax: [phone] Page: 2 of 2 Call Id: [id number] Final Disposition 10/30/2022 11:54:01 AM See PCP within 24 Hours Yes Lenon Curt, RN, Threasa Beards Caller Disagree/Comply Comply Caller Understands Yes PreDisposition Call Doctor Care Advice Given Per Guideline SEE PCP WITHIN 24 HOURS: * IF OFFICE WILL BE OPEN: You need to be examined within the next 24 hours. Call your doctor (or NP/PA) when the office opens and make an appointment. REASSURANCE AND EDUCATION - POSSIBLE URINE INFECTION: CARE ADVICE given per Urination Pain - Female (Adult) guideline. * You become worse * Fever or back pain occurs CALL BACK IF: Comments User: Erik Obey, RN Date/Time Eilene Ghazi Time): 10/30/2022 11:48:59 AM [name] at the house with her. User: Erik Obey, RN Date/Time Eilene Ghazi Time): 10/30/2022 11:51:54 AM MRI 10/23/22 - Liver - WNL - medication check Referrals REFERRED TO PCP OFFICE

## 2022-10-30 NOTE — Telephone Encounter (Signed)
Patient states she received a call from a place called "Healthcare". States she couldn't understand what they were discussing with her. States number is [phone]. Requests if pcp would know about this.

## 2022-10-31 DIAGNOSIS — M62551 Muscle wasting and atrophy, not elsewhere classified, right thigh: Secondary | ICD-10-CM | POA: Diagnosis not present

## 2022-10-31 DIAGNOSIS — R2689 Other abnormalities of gait and mobility: Secondary | ICD-10-CM | POA: Diagnosis not present

## 2022-10-31 DIAGNOSIS — R2681 Unsteadiness on feet: Secondary | ICD-10-CM | POA: Diagnosis not present

## 2022-10-31 DIAGNOSIS — M62562 Muscle wasting and atrophy, not elsewhere classified, left lower leg: Secondary | ICD-10-CM | POA: Diagnosis not present

## 2022-10-31 DIAGNOSIS — Z9181 History of falling: Secondary | ICD-10-CM | POA: Diagnosis not present

## 2022-10-31 DIAGNOSIS — R488 Other symbolic dysfunctions: Secondary | ICD-10-CM | POA: Diagnosis not present

## 2022-10-31 DIAGNOSIS — M62552 Muscle wasting and atrophy, not elsewhere classified, left thigh: Secondary | ICD-10-CM | POA: Diagnosis not present

## 2022-10-31 DIAGNOSIS — M62561 Muscle wasting and atrophy, not elsewhere classified, right lower leg: Secondary | ICD-10-CM | POA: Diagnosis not present

## 2022-10-31 DIAGNOSIS — R41841 Cognitive communication deficit: Secondary | ICD-10-CM | POA: Diagnosis not present

## 2022-11-03 ENCOUNTER — Ambulatory Visit: Payer: Medicare HMO | Admitting: Family

## 2022-11-03 DIAGNOSIS — R2681 Unsteadiness on feet: Secondary | ICD-10-CM | POA: Diagnosis not present

## 2022-11-03 DIAGNOSIS — M62562 Muscle wasting and atrophy, not elsewhere classified, left lower leg: Secondary | ICD-10-CM | POA: Diagnosis not present

## 2022-11-03 DIAGNOSIS — R41841 Cognitive communication deficit: Secondary | ICD-10-CM | POA: Diagnosis not present

## 2022-11-03 DIAGNOSIS — R2689 Other abnormalities of gait and mobility: Secondary | ICD-10-CM | POA: Diagnosis not present

## 2022-11-03 DIAGNOSIS — M62552 Muscle wasting and atrophy, not elsewhere classified, left thigh: Secondary | ICD-10-CM | POA: Diagnosis not present

## 2022-11-03 DIAGNOSIS — R488 Other symbolic dysfunctions: Secondary | ICD-10-CM | POA: Diagnosis not present

## 2022-11-03 DIAGNOSIS — M62551 Muscle wasting and atrophy, not elsewhere classified, right thigh: Secondary | ICD-10-CM | POA: Diagnosis not present

## 2022-11-03 DIAGNOSIS — M62561 Muscle wasting and atrophy, not elsewhere classified, right lower leg: Secondary | ICD-10-CM | POA: Diagnosis not present

## 2022-11-03 DIAGNOSIS — Z9181 History of falling: Secondary | ICD-10-CM | POA: Diagnosis not present

## 2022-11-05 ENCOUNTER — Ambulatory Visit (INDEPENDENT_AMBULATORY_CARE_PROVIDER_SITE_OTHER): Payer: Medicare HMO | Admitting: Family

## 2022-11-05 ENCOUNTER — Ambulatory Visit: Payer: Medicare HMO | Admitting: Physician Assistant

## 2022-11-05 ENCOUNTER — Telehealth: Payer: Self-pay | Admitting: Family Medicine

## 2022-11-05 ENCOUNTER — Encounter: Payer: Self-pay | Admitting: Family

## 2022-11-05 VITALS — BP 137/76 | HR 64 | Temp 97.3°F | Ht 60.0 in | Wt 144.1 lb

## 2022-11-05 DIAGNOSIS — M62551 Muscle wasting and atrophy, not elsewhere classified, right thigh: Secondary | ICD-10-CM | POA: Diagnosis not present

## 2022-11-05 DIAGNOSIS — R41841 Cognitive communication deficit: Secondary | ICD-10-CM | POA: Diagnosis not present

## 2022-11-05 DIAGNOSIS — R2689 Other abnormalities of gait and mobility: Secondary | ICD-10-CM | POA: Diagnosis not present

## 2022-11-05 DIAGNOSIS — R488 Other symbolic dysfunctions: Secondary | ICD-10-CM | POA: Diagnosis not present

## 2022-11-05 DIAGNOSIS — Z9181 History of falling: Secondary | ICD-10-CM | POA: Diagnosis not present

## 2022-11-05 DIAGNOSIS — M62552 Muscle wasting and atrophy, not elsewhere classified, left thigh: Secondary | ICD-10-CM | POA: Diagnosis not present

## 2022-11-05 DIAGNOSIS — R2681 Unsteadiness on feet: Secondary | ICD-10-CM | POA: Diagnosis not present

## 2022-11-05 DIAGNOSIS — M62562 Muscle wasting and atrophy, not elsewhere classified, left lower leg: Secondary | ICD-10-CM | POA: Diagnosis not present

## 2022-11-05 DIAGNOSIS — N3090 Cystitis, unspecified without hematuria: Secondary | ICD-10-CM | POA: Diagnosis not present

## 2022-11-05 DIAGNOSIS — M62561 Muscle wasting and atrophy, not elsewhere classified, right lower leg: Secondary | ICD-10-CM | POA: Diagnosis not present

## 2022-11-05 LAB — POCT URINALYSIS DIPSTICK
Bilirubin, UA: NEGATIVE
Blood, UA: POSITIVE — AB
Glucose, UA: NEGATIVE
Ketones, UA: NEGATIVE
Nitrite, UA: POSITIVE — AB
Protein, UA: NEGATIVE
Spec Grav, UA: 1.025 (ref 1.010–1.025)
Urobilinogen, UA: 0.2 E.U./dL
pH, UA: 6 (ref 5.0–8.0)

## 2022-11-05 MED ORDER — NITROFURANTOIN MONOHYD MACRO 100 MG PO CAPS: 100.0000 mg | ORAL_CAPSULE | Freq: Two times a day (BID) | ORAL | 0 refills | Status: DC

## 2022-11-05 NOTE — Progress Notes (Signed)
Patient ID: [name], female    DOB: Jan 15, 1936, 87 y.o.   MRN: [id number]  Chief Complaint  Patient presents with   Pelvic Pain    Pt c/o pelvic pain for a couple of days. Pain is a burning sensation, Mostly when she has to urinate.     HPI:      Pelvic pain:  reports BM every time she urinates, denies pain in abdomen, feels some burning, no flank or back pain, no vaginal d/c, no hematuria, reports urgency & frequency w/urination. Reports having 7-8 soft, small BM's daily for a few weeks, this am was loose. pt also is incontinent of urine.   Assessment & Plan:  1. Cystitis - sending Macrobid, advised on use & SE, pt has had in past several times, sending culture to confirm sensitivity. Advised on increased water intake.  - POCT Urinalysis Dipstick - Urine Culture - nitrofurantoin, macrocrystal-monohydrate, (MACROBID) 100 MG capsule; Take 1 capsule (100 mg total) by mouth 2 (two) times daily.  Dispense: 14 capsule; Refill: 0   Subjective:    Outpatient Medications Prior to Visit  Medication Sig Dispense Refill   brimonidine-timolol (COMBIGAN) 0.2-0.5 % ophthalmic solution Place 1 drop into both eyes every 12 (twelve) hours.     ezetimibe-simvastatin (VYTORIN) 10-20 MG tablet TAKE 1 TABLET BY MOUTH EVERY DAY 90 tablet 2   Multiple Vitamins-Minerals (MULTIVITAMIN ADULT) CHEW Chew 2 each by mouth daily. Nature Made for Her     nadolol (CORGARD) 20 MG tablet Take 1 tablet (20 mg total) by mouth at bedtime. 90 tablet 3   omeprazole (PRILOSEC) 20 MG capsule Take 20 mg by mouth daily. In the morning for acid reflux over the counter     traZODone (DESYREL) 100 MG tablet TAKE 1/4 TO 1/2 TABLET BY MOUTH AT BEDTIME IF NEEDED FOR SLEEP. 45 tablet 3   venlafaxine XR (EFFEXOR XR) 75 MG 24 hr capsule Take 1 capsule (75 mg total) by mouth daily before supper. 30 capsule 5   venlafaxine XR (EFFEXOR-XR) 150 MG 24 hr capsule Take 1 capsule (150 mg total) by mouth daily with breakfast. 90 capsule  3   No facility-administered medications prior to visit.   Past Medical History:  Diagnosis Date   Anxiety    Cancer ([place])    basal cell on nose   Colon polyps    Complication of anesthesia    takes longer to wake up- admitted overnight after colonoscopy   COVID 05/2021   mild case   Depression    GERD (gastroesophageal reflux disease)    Glaucoma    Headache    migraines   Hx: UTI (urinary tract infection)    Hypercholesteremia    Hypertension    Lichen simplex chronicus    Lumbar stenosis with neurogenic claudication    Macular degeneration    Medical history non-contributory    Osteoarthritis    Osteopenia    of the hip   PONV (postoperative nausea and vomiting)    Stress incontinence, female    Unsteady gait    Wears glasses    Whooping cough    as a baby   Past Surgical History:  Procedure Laterality Date   ABDOMINAL HYSTERECTOMY  1977   ovaries remain   ANTERIOR LAT LUMBAR FUSION N/A 10/26/2017   Procedure: LUMBAR THREE- LUMBAR FOUR, LUMBAR FOUR- LUMBAR FIVE ANTEROLATERAL LUMBAR INTERBODY ARTHRODESIS;  Surgeon: Jovita Gamma, MD;  Location: [facility];  Service: Neurosurgery;  Laterality: N/A;  LUMBAR 3-  LUMBAR 4, LUMBAR 4- LUMBAR 5 ANTEROLATERAL LUMBAR INTERBODY ARTHRODESIS, LUMBAR 3- LUMBAR 4, LUMBAR 4- LUMBAR 5 PERCUTANEOUS PEDICLE SCREW FIXATION   APPENDECTOMY     APPLICATION OF WOUND VAC Left 04/27/2021   Procedure: APPLICATION OF INCISIONAL WOUND VAC;  Surgeon: Erle Crocker, MD;  Location: [facility];  Service: Orthopedics;  Laterality: Left;   APPLICATION OF WOUND VAC Left 07/01/2021   Procedure: APPLICATION OF WOUND VAC;  Surgeon: Cindra Presume, MD;  Location: [facility];  Service: Plastics;  Laterality: Left;   BLADDER REPAIR     x two   CHOLECYSTECTOMY     COLONOSCOPY     EYE SURGERY  11/14, 12/14   Macular Dengeneration, Glaucoma   EYE SURGERY  2016   left eye   I & D EXTREMITY Left 04/27/2021   Procedure: IRRIGATION AND DEBRIDEMENT LEFT LEG;   Surgeon: Erle Crocker, MD;  Location: [facility];  Service: Orthopedics;  Laterality: Left;   INCISION AND DRAINAGE OF WOUND Right 04/27/2021   Procedure: IRRIGATION AND DEBRIDEMENT WOUND;  Surgeon: Erle Crocker, MD;  Location: [facility];  Service: Orthopedics;  Laterality: Right;   LESION REMOVAL Right 06/28/2013   Procedure: EXCISION 3 cm right labial sebaceous cyst;  Surgeon: Lyman Speller, MD;  Location: [facility];  Service: Gynecology;  Laterality: Right;   LUMBAR LAMINECTOMY/DECOMPRESSION MICRODISCECTOMY Right 03/20/2016   Procedure: Right - Lumbar four-five lumbar laminotomy, foraminotomy, and possible microdiscectomy;  Surgeon: Jovita Gamma, MD;  Location: [facility];  Service: Neurosurgery;  Laterality: Right;  right   LUMBAR PERCUTANEOUS PEDICLE SCREW 2 LEVEL  10/26/2017   Procedure: LUMBAR THREE- LUMBAR FOUR, LUMBAR FOUR- LUMBAR FIVE PERCUTANEOUS PEDICLE SCREW FIXATION;  Surgeon: Jovita Gamma, MD;  Location: [facility];  Service: Neurosurgery;;   MOHS SURGERY     procedure to remove basal cell   SKIN SPLIT GRAFT Left 07/01/2021   Procedure: SKIN GRAFT SPLIT THICKNESS;  Surgeon: Cindra Presume, MD;  Location: [facility];  Service: Plastics;  Laterality: Left;  1 hour   TONSILLECTOMY AND ADENOIDECTOMY     URETHRAL SLING  2007   URETHRAL SLING  1/12   midurethral    vertebroplasty secondary to traumatic compression fracture     Allergies  Allergen Reactions   Citrate Of Magnesia Other (See Comments)    confusion   Codeine Nausea And Vomiting   Shingrix [Zoster Vac Recomb Adjuvanted] Swelling and Rash    Rash and temp x several days. (low grade 100 -101 )       Objective:    Physical Exam Vitals and nursing note reviewed.  Constitutional:      Appearance: Normal appearance.  Cardiovascular:     Rate and Rhythm: Normal rate and regular rhythm.  Pulmonary:     Effort: Pulmonary effort is normal.     Breath sounds: Normal breath sounds.  Musculoskeletal:         General: Normal range of motion.  Skin:    General: Skin is warm and dry.  Neurological:     Mental Status: She is alert.     Motor: Weakness (extremities) present.     Gait: Gait abnormal (pt uses walker to ambulate).  Psychiatric:        Mood and Affect: Mood normal.        Behavior: Behavior normal.    BP 137/76 (BP Location: Left Arm, Patient Position: Sitting, Cuff Size: Normal)   Pulse 64   Temp (!) 97.3 F (36.3 C) (  Temporal)   Ht 5' (1.524 m)   Wt 144 lb 2 oz (65.4 kg)   LMP 08/11/1974   SpO2 93%   BMI 28.15 kg/m  Wt Readings from Last 3 Encounters:  11/05/22 144 lb 2 oz (65.4 kg)  10/07/22 149 lb (67.6 kg)  09/26/22 144 lb 9.6 oz (65.6 kg)      Jeanie Sewer, NP

## 2022-11-05 NOTE — Telephone Encounter (Signed)
Contacted Vaneta C Hedman to schedule their annual wellness visit. Appointment made for 11/06/2022.  [place] Direct Dial [phone]

## 2022-11-06 ENCOUNTER — Ambulatory Visit (INDEPENDENT_AMBULATORY_CARE_PROVIDER_SITE_OTHER): Payer: Medicare HMO

## 2022-11-06 DIAGNOSIS — M62562 Muscle wasting and atrophy, not elsewhere classified, left lower leg: Secondary | ICD-10-CM | POA: Diagnosis not present

## 2022-11-06 DIAGNOSIS — Z9181 History of falling: Secondary | ICD-10-CM | POA: Diagnosis not present

## 2022-11-06 DIAGNOSIS — Z0000 Encounter for general adult medical examination without abnormal findings: Secondary | ICD-10-CM

## 2022-11-06 DIAGNOSIS — M62561 Muscle wasting and atrophy, not elsewhere classified, right lower leg: Secondary | ICD-10-CM | POA: Diagnosis not present

## 2022-11-06 DIAGNOSIS — R2681 Unsteadiness on feet: Secondary | ICD-10-CM | POA: Diagnosis not present

## 2022-11-06 DIAGNOSIS — M62551 Muscle wasting and atrophy, not elsewhere classified, right thigh: Secondary | ICD-10-CM | POA: Diagnosis not present

## 2022-11-06 DIAGNOSIS — R2689 Other abnormalities of gait and mobility: Secondary | ICD-10-CM | POA: Diagnosis not present

## 2022-11-06 DIAGNOSIS — M62552 Muscle wasting and atrophy, not elsewhere classified, left thigh: Secondary | ICD-10-CM | POA: Diagnosis not present

## 2022-11-06 DIAGNOSIS — R488 Other symbolic dysfunctions: Secondary | ICD-10-CM | POA: Diagnosis not present

## 2022-11-06 DIAGNOSIS — R41841 Cognitive communication deficit: Secondary | ICD-10-CM | POA: Diagnosis not present

## 2022-11-06 NOTE — Patient Instructions (Signed)
[name] , Thank you for taking time to come for your Medicare Wellness Visit. I appreciate your ongoing commitment to your health goals. Please review the following plan we discussed and let me know if I can assist you in the future.   These are the goals we discussed:  Goals      Patient Stated     To be able to walk again. Eat well; eat fruit and vegetables and get enough nutrition to heal      Patient Stated     Moving to a retirement home     Patient Stated     Walk better      Track and Manage My Blood Pressure-Hypertension     Timeframe:  Long-Range Goal Priority:  High Start Date:  07/15/21                           Expected End Date:  01/13/22                     Follow Up Date 10/13/21    - check blood pressure weekly - choose a place to take my blood pressure (home, clinic or office, retail store) - write blood pressure results in a log or diary    Why is this important?   You won't feel high blood pressure, but it can still hurt your blood vessels.  High blood pressure can cause heart or kidney problems. It can also cause a stroke.  Making lifestyle changes like losing a little weight or eating less salt will help.  Checking your blood pressure at home and at different times of the day can help to control blood pressure.  If the doctor prescribes medicine remember to take it the way the doctor ordered.  Call the office if you cannot afford the medicine or if there are questions about it.     Notes:      weight     Please get a good variety of colorful food   Watches her portions; eating less   Fat free or low fat dairy products Fish high in omega-3 acids ( salmon, tuna, trout) Fruits, such as apples, bananas, oranges, pears, prunes Legumes, such as kidney beans, lentils, checkpeas, black-eyed peas and lima beans Vegetables; broccoli, cabbage, carrots Whole grains;   Plant fats are better; decrease "white" foods as pasta, rice, bread and desserts,  sugar; Avoid red meat (limiting) palm and coconut oils; sugary foods and beverages  Two nutrients that raise blood chol levels are saturated fats and trans fat; in hydrogenated oils and fats, as stick margarine, baked goods (cookes, cakes, pies, crackers; frosting; and coffee creamers;   Some Fats lower cholesterol: Monounsaturated and polyunsaturated  Avocados Corn, sunflower, and soybean oils Nuts and seeds, such as walnuts Olive, canola, peanut, safflower, and sesame oils Peanut butter Salmon and trout Tofu           This is a list of the screening recommended for you and due dates:  Health Maintenance  Topic Date Due   Zoster (Shingles) Vaccine (2 of 2) 02/11/2017   Medicare Annual Wellness Visit  11/06/2023   DTaP/Tdap/Td vaccine (4 - Td or Tdap) 12/18/2031   Pneumonia Vaccine  Completed   Flu Shot  Completed   DEXA scan (bone density measurement)  Completed   COVID-19 Vaccine  Completed   HPV Vaccine  Aged Out    Advanced directives: Advance directive discussed with  you today. Even though you declined this today please call our office should you change your mind and we can give you the proper paperwork for you to fill out.  Conditions/risks identified: walk better   Next appointment: Follow up in one year for your annual wellness visit    Preventive Care 65 Years and Older, Female Preventive care refers to lifestyle choices and visits with your health care provider that can promote health and wellness. What does preventive care include? A yearly physical exam. This is also called an annual well check. Dental exams once or twice a year. Routine eye exams. Ask your health care provider how often you should have your eyes checked. Personal lifestyle choices, including: Daily care of your teeth and gums. Regular physical activity. Eating a healthy diet. Avoiding tobacco and drug use. Limiting alcohol use. Practicing safe sex. Taking low-dose aspirin every day. Taking  vitamin and mineral supplements as recommended by your health care provider. What happens during an annual well check? The services and screenings done by your health care provider during your annual well check will depend on your age, overall health, lifestyle risk factors, and family history of disease. Counseling  Your health care provider may ask you questions about your: Alcohol use. Tobacco use. Drug use. Emotional well-being. Home and relationship well-being. Sexual activity. Eating habits. History of falls. Memory and ability to understand (cognition). Work and work Statistician. Reproductive health. Screening  You may have the following tests or measurements: Height, weight, and BMI. Blood pressure. Lipid and cholesterol levels. These may be checked every 5 years, or more frequently if you are over 59 years old. Skin check. Lung cancer screening. You may have this screening every year starting at age 69 if you have a 30-pack-year history of smoking and currently smoke or have quit within the past 15 years. Fecal occult blood test (FOBT) of the stool. You may have this test every year starting at age 16. Flexible sigmoidoscopy or colonoscopy. You may have a sigmoidoscopy every 5 years or a colonoscopy every 10 years starting at age 47. Hepatitis C blood test. Hepatitis B blood test. Sexually transmitted disease (STD) testing. Diabetes screening. This is done by checking your blood sugar (glucose) after you have not eaten for a while (fasting). You may have this done every 1-3 years. Bone density scan. This is done to screen for osteoporosis. You may have this done starting at age 37. Mammogram. This may be done every 1-2 years. Talk to your health care provider about how often you should have regular mammograms. Talk with your health care provider about your test results, treatment options, and if necessary, the need for more tests. Vaccines  Your health care provider may  recommend certain vaccines, such as: Influenza vaccine. This is recommended every year. Tetanus, diphtheria, and acellular pertussis (Tdap, Td) vaccine. You may need a Td booster every 10 years. Zoster vaccine. You may need this after age 42. Pneumococcal 13-valent conjugate (PCV13) vaccine. One dose is recommended after age 50. Pneumococcal polysaccharide (PPSV23) vaccine. One dose is recommended after age 20. Talk to your health care provider about which screenings and vaccines you need and how often you need them. This information is not intended to replace advice given to you by your health care provider. Make sure you discuss any questions you have with your health care provider. Document Released: 08/24/2015 Document Revised: 04/16/2016 Document Reviewed: 05/29/2015 Elsevier Interactive Patient Education  2017 [employer] Prevention in the Home  Falls can cause injuries. They can happen to people of all ages. There are many things you can do to make your home safe and to help prevent falls. What can I do on the outside of my home? Regularly fix the edges of walkways and driveways and fix any cracks. Remove anything that might make you trip as you walk through a door, such as a raised step or threshold. Trim any bushes or trees on the path to your home. Use bright outdoor lighting. Clear any walking paths of anything that might make someone trip, such as rocks or tools. Regularly check to see if handrails are loose or broken. Make sure that both sides of any steps have handrails. Any raised decks and porches should have guardrails on the edges. Have any leaves, snow, or ice cleared regularly. Use sand or salt on walking paths during winter. Clean up any spills in your garage right away. This includes oil or grease spills. What can I do in the bathroom? Use night lights. Install grab bars by the toilet and in the tub and shower. Do not use towel bars as grab bars. Use non-skid  mats or decals in the tub or shower. If you need to sit down in the shower, use a plastic, non-slip stool. Keep the floor dry. Clean up any water that spills on the floor as soon as it happens. Remove soap buildup in the tub or shower regularly. Attach bath mats securely with double-sided non-slip rug tape. Do not have throw rugs and other things on the floor that can make you trip. What can I do in the bedroom? Use night lights. Make sure that you have a light by your bed that is easy to reach. Do not use any sheets or blankets that are too big for your bed. They should not hang down onto the floor. Have a firm chair that has side arms. You can use this for support while you get dressed. Do not have throw rugs and other things on the floor that can make you trip. What can I do in the kitchen? Clean up any spills right away. Avoid walking on wet floors. Keep items that you use a lot in easy-to-reach places. If you need to reach something above you, use a strong step stool that has a grab bar. Keep electrical cords out of the way. Do not use floor polish or wax that makes floors slippery. If you must use wax, use non-skid floor wax. Do not have throw rugs and other things on the floor that can make you trip. What can I do with my stairs? Do not leave any items on the stairs. Make sure that there are handrails on both sides of the stairs and use them. Fix handrails that are broken or loose. Make sure that handrails are as long as the stairways. Check any carpeting to make sure that it is firmly attached to the stairs. Fix any carpet that is loose or worn. Avoid having throw rugs at the top or bottom of the stairs. If you do have throw rugs, attach them to the floor with carpet tape. Make sure that you have a light switch at the top of the stairs and the bottom of the stairs. If you do not have them, ask someone to add them for you. What else can I do to help prevent falls? Wear shoes  that: Do not have high heels. Have rubber bottoms. Are comfortable and fit you well. Are closed at  the toe. Do not wear sandals. If you use a stepladder: Make sure that it is fully opened. Do not climb a closed stepladder. Make sure that both sides of the stepladder are locked into place. Ask someone to hold it for you, if possible. Clearly mark and make sure that you can see: Any grab bars or handrails. First and last steps. Where the edge of each step is. Use tools that help you move around (mobility aids) if they are needed. These include: Canes. Walkers. Scooters. Crutches. Turn on the lights when you go into a dark area. Replace any light bulbs as soon as they burn out. Set up your furniture so you have a clear path. Avoid moving your furniture around. If any of your floors are uneven, fix them. If there are any pets around you, be aware of where they are. Review your medicines with your doctor. Some medicines can make you feel dizzy. This can increase your chance of falling. Ask your doctor what other things that you can do to help prevent falls. This information is not intended to replace advice given to you by your health care provider. Make sure you discuss any questions you have with your health care provider. Document Released: 05/24/2009 Document Revised: 01/03/2016 Document Reviewed: 09/01/2014 Elsevier Interactive Patient Education  2017 [employer].

## 2022-11-06 NOTE — Progress Notes (Signed)
I connected with  [name] on 11/06/22 by a audio enabled telemedicine application and verified that I am speaking with the correct person using two identifiers.  Patient Location: Home  Provider Location: Office/Clinic  I discussed the limitations of evaluation and management by telemedicine. The patient expressed understanding and agreed to proceed.   Subjective:   [name] is a 87 y.o. female who presents for Medicare Annual (Subsequent) preventive examination.  Review of Systems     Cardiac Risk Factors include: advanced age (>27men, >81 women);dyslipidemia;hypertension     Objective:    There were no vitals filed for this visit. There is no height or weight on file to calculate BMI.     11/06/2022    9:44 AM 10/07/2022    1:42 PM 03/10/2022   11:51 AM 12/17/2021    3:55 PM 04/27/2021    6:36 PM 04/23/2020   11:59 AM 12/29/2019    6:04 PM  Advanced Directives  Does Patient Have a Medical Advance Directive? No No No No Yes Yes No  Type of Advance Directive     [place] in Chart?      No - copy requested   Would patient like information on creating a medical advance directive? No - Patient declined    No - Patient declined  No - Patient declined    Current Medications (verified) Outpatient Encounter Medications as of 11/06/2022  Medication Sig   brimonidine-timolol (COMBIGAN) 0.2-0.5 % ophthalmic solution Place 1 drop into both eyes every 12 (twelve) hours.   ezetimibe-simvastatin (VYTORIN) 10-20 MG tablet TAKE 1 TABLET BY MOUTH EVERY DAY   Multiple Vitamins-Minerals (MULTIVITAMIN ADULT) CHEW Chew 2 each by mouth daily. Nature Made for Her   nadolol (CORGARD) 20 MG tablet Take 1 tablet (20 mg total) by mouth at bedtime.   nitrofurantoin, macrocrystal-monohydrate, (MACROBID) 100 MG capsule Take 1 capsule (100 mg total) by mouth 2 (two) times daily.   omeprazole  (PRILOSEC) 20 MG capsule Take 20 mg by mouth daily. In the morning for acid reflux over the counter   traZODone (DESYREL) 100 MG tablet TAKE 1/4 TO 1/2 TABLET BY MOUTH AT BEDTIME IF NEEDED FOR SLEEP.   venlafaxine XR (EFFEXOR XR) 75 MG 24 hr capsule Take 1 capsule (75 mg total) by mouth daily before supper.   venlafaxine XR (EFFEXOR-XR) 150 MG 24 hr capsule Take 1 capsule (150 mg total) by mouth daily with breakfast.   No facility-administered encounter medications on file as of 11/06/2022.    Allergies (verified) Citrate of magnesia, Codeine, and Shingrix [zoster vac recomb adjuvanted]   History: Past Medical History:  Diagnosis Date   Anxiety    Cancer ([place])    basal cell on nose   Colon polyps    Complication of anesthesia    takes longer to wake up- admitted overnight after colonoscopy   COVID 05/2021   mild case   Depression    GERD (gastroesophageal reflux disease)    Glaucoma    Headache    migraines   Hx: UTI (urinary tract infection)    Hypercholesteremia    Hypertension    Lichen simplex chronicus    Lumbar stenosis with neurogenic claudication    Macular degeneration    Medical history non-contributory    Osteoarthritis    Osteopenia    of the hip   PONV (postoperative nausea and vomiting)  Stress incontinence, female    Unsteady gait    Wears glasses    Whooping cough    as a baby   Past Surgical History:  Procedure Laterality Date   ABDOMINAL HYSTERECTOMY  1977   ovaries remain   ANTERIOR LAT LUMBAR FUSION N/A 10/26/2017   Procedure: LUMBAR THREE- LUMBAR FOUR, LUMBAR FOUR- LUMBAR FIVE ANTEROLATERAL LUMBAR INTERBODY ARTHRODESIS;  Surgeon: Jovita Gamma, MD;  Location: [place];  Service: Neurosurgery;  Laterality: N/A;  LUMBAR 3- LUMBAR 4, LUMBAR 4- LUMBAR 5 ANTEROLATERAL LUMBAR INTERBODY ARTHRODESIS, LUMBAR 3- LUMBAR 4, LUMBAR 4- LUMBAR 5 PERCUTANEOUS PEDICLE SCREW FIXATION   APPENDECTOMY     APPLICATION OF WOUND VAC Left 04/27/2021   Procedure:  APPLICATION OF INCISIONAL WOUND VAC;  Surgeon: Erle Crocker, MD;  Location: [facility];  Service: Orthopedics;  Laterality: Left;   APPLICATION OF WOUND VAC Left 07/01/2021   Procedure: APPLICATION OF WOUND VAC;  Surgeon: Cindra Presume, MD;  Location: [place];  Service: Plastics;  Laterality: Left;   BLADDER REPAIR     x two   CHOLECYSTECTOMY     COLONOSCOPY     EYE SURGERY  11/14, 12/14   Macular Dengeneration, Glaucoma   EYE SURGERY  2016   left eye   I & D EXTREMITY Left 04/27/2021   Procedure: IRRIGATION AND DEBRIDEMENT LEFT LEG;  Surgeon: Erle Crocker, MD;  Location: [facility];  Service: Orthopedics;  Laterality: Left;   INCISION AND DRAINAGE OF WOUND Right 04/27/2021   Procedure: IRRIGATION AND DEBRIDEMENT WOUND;  Surgeon: Erle Crocker, MD;  Location: [place];  Service: Orthopedics;  Laterality: Right;   LESION REMOVAL Right 06/28/2013   Procedure: EXCISION 3 cm right labial sebaceous cyst;  Surgeon: Lyman Speller, MD;  Location: [facility];  Service: Gynecology;  Laterality: Right;   LUMBAR LAMINECTOMY/DECOMPRESSION MICRODISCECTOMY Right 03/20/2016   Procedure: Right - Lumbar four-five lumbar laminotomy, foraminotomy, and possible microdiscectomy;  Surgeon: Jovita Gamma, MD;  Location: [facility];  Service: Neurosurgery;  Laterality: Right;  right   LUMBAR PERCUTANEOUS PEDICLE SCREW 2 LEVEL  10/26/2017   Procedure: LUMBAR THREE- LUMBAR FOUR, LUMBAR FOUR- LUMBAR FIVE PERCUTANEOUS PEDICLE SCREW FIXATION;  Surgeon: Jovita Gamma, MD;  Location: [facility];  Service: Neurosurgery;;   MOHS SURGERY     procedure to remove basal cell   SKIN SPLIT GRAFT Left 07/01/2021   Procedure: SKIN GRAFT SPLIT THICKNESS;  Surgeon: Cindra Presume, MD;  Location: [facility];  Service: Plastics;  Laterality: Left;  1 hour   TONSILLECTOMY AND ADENOIDECTOMY     URETHRAL SLING  2007   URETHRAL SLING  1/12   midurethral    vertebroplasty secondary to traumatic compression fracture     Family  History  Problem Relation Age of Onset   Congestive Heart Failure Mother    Thyroid disease Mother    Osteoporosis Mother    Alcoholism Mother    Prostate cancer Father    Breast cancer Maternal Grandmother    Hypertension Sister    Thyroid disease Sister    Cancer Sister        brain ?   Rheum arthritis Daughter    Social History   Socioeconomic History   Marital status: Married    Spouse name: Not on file   Number of children: 2   Years of education: [employer]   [facility] education level: Not on file  Occupational History   Occupation: Retired  Tobacco Use   Smoking status:  Former    Types: Cigarettes    Quit date: 08/11/1965    Years since quitting: 57.2   Smokeless tobacco: Never   Tobacco comments:    has not smoked in 86 years   Vaping Use   Vaping Use: Never used  Substance and Sexual Activity   Alcohol use: Not Currently    Alcohol/week: 1.0 standard drink of alcohol    Types: 1 Standard drinks or equivalent per week    Comment: occ    Drug use: No   Sexual activity: Not Currently    Partners: Male    Birth control/protection: Surgical    Comment: TAH  Other Topics Concern   Not on file  Social History Narrative   HSG, attended [employer]   Married '65   1 son '67, 45 Daughter '69; 5 grandchildren. 1 grandson dealing with drugs after divorce of parents. Oldest.    Work: retired Pharmacist, hospital   Marriage in good health   Former smoker   Right-handed   Caffeine: 11/2 cups per day   Social Determinants of Health   Financial Resource Strain: Low Risk  (11/06/2022)   Overall Financial Resource Strain (CARDIA)    Difficulty of Paying Living Expenses: Not hard at all  Food Insecurity: No Food Insecurity (11/06/2022)   Hunger Vital Sign    Worried About Running Out of Food in the Last Year: Never true    [place] in the Last Year: Never true  Transportation Needs: No Transportation Needs (11/06/2022)   PRAPARE - Hydrologist  (Medical): No    Lack of Transportation (Non-Medical): No  Physical Activity: Sufficiently Active (11/06/2022)   Exercise Vital Sign    Days of Exercise per Week: 7 days    Minutes of Exercise per Session: 60 min  Stress: No Stress Concern Present (11/06/2022)   [place]    Feeling of Stress : Not at all  Social Connections: Moderately Integrated (11/06/2022)   Social Connection and Isolation Panel [NHANES]    Frequency of Communication with Friends and Family: More than three times a week    Frequency of Social Gatherings with Friends and Family: More than three times a week    Attends Religious Services: More than 4 times per year    Active Member of [employer] or Organizations: No    Attends Music therapist: Never    Marital Status: Married    Tobacco Counseling Counseling given: Not Answered Tobacco comments: has not smoked in 45 years    Clinical Intake:  Pre-visit preparation completed: Yes        BMI - recorded: 28.15 Nutritional Status: BMI 25 -29 Overweight Nutritional Risks: None Diabetes: No  How often do you need to have someone help you when you read instructions, pamphlets, or other written materials from your doctor or pharmacy?: 1 - Never  Diabetic?no  Interpreter Needed?: No  Information entered by :: Charlott Rakes, LPN   Activities of Daily Living    11/06/2022    9:46 AM  In your present state of health, do you have any difficulty performing the following activities:  Hearing? 1  Comment wears hearing aids  Vision? 0  Difficulty concentrating or making decisions? 0  Walking or climbing stairs? 0  Dressing or bathing? 0  Doing errands, shopping? 0  Preparing Food and eating ? N  Comment eat meals in dinning room  Using the Toilet?  N  In the past six months, have you accidently leaked urine? Y  Comment wears a diaper and pad  Do you have problems with loss of bowel  control? Y  Managing your Medications? N  Managing your Finances? N  Housekeeping or managing your Housekeeping? N    Patient Care Team: Marin Olp, MD as PCP - General (Family Medicine) Sherren Mocha, MD as PCP - Cardiology (Cardiology) Edythe Clarity, [facility] (Pharmacist) Elease Hashimoto (Neurology)  Indicate any recent Medical Services you may have received from other than [facility] providers in the past year (date may be approximate).     Assessment:   This is a routine wellness examination for [place].  Hearing/Vision screen Hearing Screening - Comments:: Pt has hearing aids  Vision Screening - Comments:: Pt follows up with Dr Ellie Lunch for annual eye exams   Dietary issues and exercise activities discussed: Current Exercise Habits: Home exercise routine, Type of exercise: walking, Time (Minutes): 60, Frequency (Times/Week): 7, Weekly Exercise (Minutes/Week): 420   Goals Addressed             This Visit's Progress    Patient Stated       Walk better        Depression Screen    11/06/2022    9:38 AM 09/26/2022    1:33 PM 06/23/2022    3:02 PM 06/23/2022    2:32 PM 03/24/2022    2:45 PM 10/22/2021    4:19 PM 06/25/2021    3:06 PM  PHQ 2/9 Scores  PHQ - 2 Score 0 0 0 0 0 4 0  PHQ- 9 Score 0 4 1  0 4 0    Fall Risk    11/06/2022    9:45 AM 10/07/2022    1:42 PM 09/26/2022    1:32 PM 06/23/2022    2:31 PM 03/10/2022    8:37 AM  Fall Risk   Falls in the past year? 1 1 1 1 1   Number falls in past yr: 1 1 1 1 1   Injury with Fall? 1 1 1 1 1   Risk for fall due to : Impaired balance/gait;Impaired mobility;Impaired vision;History of fall(s)  History of fall(s)  History of fall(s)  Follow up Falls prevention discussed Falls evaluation completed Falls evaluation completed  Falls evaluation completed;Education provided;Falls prevention discussed    FALL RISK PREVENTION PERTAINING TO THE HOME:  Any stairs in or around the home? No  If so, are there any  without handrails? No  Home free of loose throw rugs in walkways, pet beds, electrical cords, etc? Yes  Adequate lighting in your home to reduce risk of falls? Yes   ASSISTIVE DEVICES UTILIZED TO PREVENT FALLS:  Life alert? Yes  Use of a cane, walker or w/c? Yes  Grab bars in the bathroom? Yes  Shower chair or bench in shower? Yes  Elevated toilet seat or a handicapped toilet? Yes   TIMED UP AND GO:  Was the test performed? No .   Cognitive Function:    03/25/2017    4:19 PM  MMSE - Mini Mental State Exam  Orientation to time 5  Orientation to Place 5  Registration 3  Attention/ Calculation 5  Recall 3  Language- name 2 objects 2  Language- follow 3 step command 3  Language- read & follow direction 1  Write a sentence 1  Copy design 1      10/15/2022    3:00 PM 10/07/2022    5:00  PM  Montreal Cognitive Assessment   Visuospatial/ Executive (0/5) 3 4  Naming (0/3) 3 3  Attention: Read list of digits (0/2) 2 2  Attention: Read list of letters (0/1) 1 1  Attention: Serial 7 subtraction starting at 100 (0/3) 3 3  Language: Repeat phrase (0/2) 2 2  Language : Fluency (0/1) 1 1  Abstraction (0/2) 1 1  Delayed Recall (0/5) 4 4  Orientation (0/6) 5 5  Total 25 26  Adjusted Score (based on education) 25 26      04/23/2020   12:05 PM 04/04/2019    2:30 PM  6CIT Screen  What Year? 0 points 0 points  What month? 0 points 0 points  What time?  0 points  Count back from 20 0 points 0 points  Months in reverse 0 points 0 points  Repeat phrase 0 points 0 points  Total Score  0 points    Immunizations Immunization History  Administered Date(s) Administered   Covid-19, Mrna,Vaccine(Spikevax)8yrs and older 06/11/2022   Fluad Quad(high Dose 65+) 05/26/2019, 06/18/2020, 05/15/2022   Influenza Whole 07/18/2004, 04/25/2010, 04/28/2010   Influenza, High Dose Seasonal PF 08/26/2013, 04/19/2014, 04/04/2016, 05/21/2021   Influenza-Unspecified 05/11/2012, 04/03/2015, 04/13/2017    PFIZER Comirnaty(Gray Top)Covid-19 Tri-Sucrose Vaccine 01/10/2021   PFIZER(Purple Top)SARS-COV-2 Vaccination 09/09/2019, 09/30/2019, 05/24/2020   Pfizer Covid-19 Vaccine Bivalent Booster 36yrs & up 05/21/2021   Pneumococcal Conjugate-13 10/08/2015   Pneumococcal Polysaccharide-23 08/11/2006   Td 03/09/2008   Tdap 04/03/2015, 12/17/2021   Zoster Recombinat (Shingrix) 12/17/2016   Zoster, Live 08/11/2009    TDAP status: Up to date  Flu Vaccine status: Up to date  Pneumococcal vaccine status: Up to date  Covid-19 vaccine status: Completed vaccines  Qualifies for Shingles Vaccine? Yes   Zostavax completed Yes   Shingrix Completed?: No.    Education has been provided regarding the importance of this vaccine. Patient has been advised to call insurance company to determine out of pocket expense if they have not yet received this vaccine. Advised may also receive vaccine at local pharmacy or Health Dept. Verbalized acceptance and understanding.  Screening Tests Health Maintenance  Topic Date Due   Zoster Vaccines- Shingrix (2 of 2) 02/11/2017   Medicare Annual Wellness (AWV)  11/06/2023   DTaP/Tdap/Td (4 - Td or Tdap) 12/18/2031   Pneumonia Vaccine 4+ Years old  Completed   INFLUENZA VACCINE  Completed   DEXA SCAN  Completed   COVID-19 Vaccine  Completed   HPV VACCINES  Aged Out    Health Maintenance  Health Maintenance Due  Topic Date Due   Zoster Vaccines- Shingrix (2 of 2) 02/11/2017    Colorectal cancer screening: No longer required.   Mammogram status: No longer required due to age .  Additional Screening:   Vision Screening: Recommended annual ophthalmology exams for early detection of glaucoma and other disorders of the eye. Is the patient up to date with their annual eye exam?  Yes  Who is the provider or what is the name of the office in which the patient attends annual eye exams? [facility]  If pt is not established with a provider, would they like to be  referred to a provider to establish care? No .   Dental Screening: Recommended annual dental exams for proper oral hygiene  Community Resource Referral / Chronic Care Management: CRR required this visit?  No   CCM required this visit?  No      Plan:     I have personally reviewed and  noted the following in the patient's chart:   Medical and social history Use of alcohol, tobacco or illicit drugs  Current medications and supplements including opioid prescriptions. Patient is not currently taking opioid prescriptions. Functional ability and status Nutritional status Physical activity Advanced directives List of other physicians Hospitalizations, surgeries, and ER visits in previous 12 months Vitals Screenings to include cognitive, depression, and falls Referrals and appointments  In addition, I have reviewed and discussed with patient certain preventive protocols, quality metrics, and best practice recommendations. A written personalized care plan for preventive services as well as general preventive health recommendations were provided to patient.     Willette Brace, LPN   [id number]   Nurse Notes: none

## 2022-11-07 DIAGNOSIS — R2689 Other abnormalities of gait and mobility: Secondary | ICD-10-CM | POA: Diagnosis not present

## 2022-11-07 DIAGNOSIS — Z9181 History of falling: Secondary | ICD-10-CM | POA: Diagnosis not present

## 2022-11-07 DIAGNOSIS — M62551 Muscle wasting and atrophy, not elsewhere classified, right thigh: Secondary | ICD-10-CM | POA: Diagnosis not present

## 2022-11-07 DIAGNOSIS — R488 Other symbolic dysfunctions: Secondary | ICD-10-CM | POA: Diagnosis not present

## 2022-11-07 DIAGNOSIS — R2681 Unsteadiness on feet: Secondary | ICD-10-CM | POA: Diagnosis not present

## 2022-11-07 DIAGNOSIS — M62552 Muscle wasting and atrophy, not elsewhere classified, left thigh: Secondary | ICD-10-CM | POA: Diagnosis not present

## 2022-11-07 DIAGNOSIS — M62562 Muscle wasting and atrophy, not elsewhere classified, left lower leg: Secondary | ICD-10-CM | POA: Diagnosis not present

## 2022-11-07 DIAGNOSIS — R41841 Cognitive communication deficit: Secondary | ICD-10-CM | POA: Diagnosis not present

## 2022-11-07 DIAGNOSIS — M62561 Muscle wasting and atrophy, not elsewhere classified, right lower leg: Secondary | ICD-10-CM | POA: Diagnosis not present

## 2022-11-07 LAB — URINE CULTURE
MICRO NUMBER:: 14749388
SPECIMEN QUALITY:: ADEQUATE

## 2022-11-10 DIAGNOSIS — R41841 Cognitive communication deficit: Secondary | ICD-10-CM | POA: Diagnosis not present

## 2022-11-10 DIAGNOSIS — M62552 Muscle wasting and atrophy, not elsewhere classified, left thigh: Secondary | ICD-10-CM | POA: Diagnosis not present

## 2022-11-10 DIAGNOSIS — R2689 Other abnormalities of gait and mobility: Secondary | ICD-10-CM | POA: Diagnosis not present

## 2022-11-10 DIAGNOSIS — Z9181 History of falling: Secondary | ICD-10-CM | POA: Diagnosis not present

## 2022-11-10 DIAGNOSIS — M62561 Muscle wasting and atrophy, not elsewhere classified, right lower leg: Secondary | ICD-10-CM | POA: Diagnosis not present

## 2022-11-10 DIAGNOSIS — M62551 Muscle wasting and atrophy, not elsewhere classified, right thigh: Secondary | ICD-10-CM | POA: Diagnosis not present

## 2022-11-10 DIAGNOSIS — R488 Other symbolic dysfunctions: Secondary | ICD-10-CM | POA: Diagnosis not present

## 2022-11-10 DIAGNOSIS — R2681 Unsteadiness on feet: Secondary | ICD-10-CM | POA: Diagnosis not present

## 2022-11-10 DIAGNOSIS — M62562 Muscle wasting and atrophy, not elsewhere classified, left lower leg: Secondary | ICD-10-CM | POA: Diagnosis not present

## 2022-11-10 NOTE — Progress Notes (Signed)
I connected with  [name] on 11/10/22 by a audio enabled telemedicine application and verified that I am speaking with the correct person using two identifiers.  Patient Location: Home  Provider Location: Office/Clinic  I discussed the limitations of evaluation and management by telemedicine. The patient expressed understanding and agreed to proceed.   Subjective:   [name] is a 87 y.o. female who presents for Medicare Annual (Subsequent) preventive examination.  Review of Systems     Cardiac Risk Factors include: advanced age (>45men, >42 women);dyslipidemia;hypertension     Objective:    There were no vitals filed for this visit. There is no height or weight on file to calculate BMI.     11/06/2022    9:44 AM 10/07/2022    1:42 PM 03/10/2022   11:51 AM 12/17/2021    3:55 PM 04/27/2021    6:36 PM 04/23/2020   11:59 AM 12/29/2019    6:04 PM  Advanced Directives  Does Patient Have a Medical Advance Directive? No No No No Yes Yes No  Type of Advance Directive     [place] in Chart?      No - copy requested   Would patient like information on creating a medical advance directive? No - Patient declined    No - Patient declined  No - Patient declined    Current Medications (verified) Outpatient Encounter Medications as of 11/06/2022  Medication Sig   brimonidine-timolol (COMBIGAN) 0.2-0.5 % ophthalmic solution Place 1 drop into both eyes every 12 (twelve) hours.   ezetimibe-simvastatin (VYTORIN) 10-20 MG tablet TAKE 1 TABLET BY MOUTH EVERY DAY   Multiple Vitamins-Minerals (MULTIVITAMIN ADULT) CHEW Chew 2 each by mouth daily. Nature Made for Her   nadolol (CORGARD) 20 MG tablet Take 1 tablet (20 mg total) by mouth at bedtime.   nitrofurantoin, macrocrystal-monohydrate, (MACROBID) 100 MG capsule Take 1 capsule (100 mg total) by mouth 2 (two) times daily.   omeprazole  (PRILOSEC) 20 MG capsule Take 20 mg by mouth daily. In the morning for acid reflux over the counter   traZODone (DESYREL) 100 MG tablet TAKE 1/4 TO 1/2 TABLET BY MOUTH AT BEDTIME IF NEEDED FOR SLEEP.   venlafaxine XR (EFFEXOR XR) 75 MG 24 hr capsule Take 1 capsule (75 mg total) by mouth daily before supper.   venlafaxine XR (EFFEXOR-XR) 150 MG 24 hr capsule Take 1 capsule (150 mg total) by mouth daily with breakfast.   No facility-administered encounter medications on file as of 11/06/2022.    Allergies (verified) Citrate of magnesia, Codeine, and Shingrix [zoster vac recomb adjuvanted]   History: Past Medical History:  Diagnosis Date   Anxiety    Cancer ([place])    basal cell on nose   Colon polyps    Complication of anesthesia    takes longer to wake up- admitted overnight after colonoscopy   COVID 05/2021   mild case   Depression    GERD (gastroesophageal reflux disease)    Glaucoma    Headache    migraines   Hx: UTI (urinary tract infection)    Hypercholesteremia    Hypertension    Lichen simplex chronicus    Lumbar stenosis with neurogenic claudication    Macular degeneration    Medical history non-contributory    Osteoarthritis    Osteopenia    of the hip   PONV (postoperative nausea and vomiting)  Stress incontinence, female    Unsteady gait    Wears glasses    Whooping cough    as a baby   Past Surgical History:  Procedure Laterality Date   ABDOMINAL HYSTERECTOMY  1977   ovaries remain   ANTERIOR LAT LUMBAR FUSION N/A 10/26/2017   Procedure: LUMBAR THREE- LUMBAR FOUR, LUMBAR FOUR- LUMBAR FIVE ANTEROLATERAL LUMBAR INTERBODY ARTHRODESIS;  Surgeon: Jovita Gamma, MD;  Location: [facility];  Service: Neurosurgery;  Laterality: N/A;  LUMBAR 3- LUMBAR 4, LUMBAR 4- LUMBAR 5 ANTEROLATERAL LUMBAR INTERBODY ARTHRODESIS, LUMBAR 3- LUMBAR 4, LUMBAR 4- LUMBAR 5 PERCUTANEOUS PEDICLE SCREW FIXATION   APPENDECTOMY     APPLICATION OF WOUND VAC Left 04/27/2021   Procedure:  APPLICATION OF INCISIONAL WOUND VAC;  Surgeon: Erle Crocker, MD;  Location: [facility];  Service: Orthopedics;  Laterality: Left;   APPLICATION OF WOUND VAC Left 07/01/2021   Procedure: APPLICATION OF WOUND VAC;  Surgeon: Cindra Presume, MD;  Location: [place];  Service: Plastics;  Laterality: Left;   BLADDER REPAIR     x two   CHOLECYSTECTOMY     COLONOSCOPY     EYE SURGERY  11/14, 12/14   Macular Dengeneration, Glaucoma   EYE SURGERY  2016   left eye   I & D EXTREMITY Left 04/27/2021   Procedure: IRRIGATION AND DEBRIDEMENT LEFT LEG;  Surgeon: Erle Crocker, MD;  Location: [facility];  Service: Orthopedics;  Laterality: Left;   INCISION AND DRAINAGE OF WOUND Right 04/27/2021   Procedure: IRRIGATION AND DEBRIDEMENT WOUND;  Surgeon: Erle Crocker, MD;  Location: [place];  Service: Orthopedics;  Laterality: Right;   LESION REMOVAL Right 06/28/2013   Procedure: EXCISION 3 cm right labial sebaceous cyst;  Surgeon: Lyman Speller, MD;  Location: [facility];  Service: Gynecology;  Laterality: Right;   LUMBAR LAMINECTOMY/DECOMPRESSION MICRODISCECTOMY Right 03/20/2016   Procedure: Right - Lumbar four-five lumbar laminotomy, foraminotomy, and possible microdiscectomy;  Surgeon: Jovita Gamma, MD;  Location: [facility];  Service: Neurosurgery;  Laterality: Right;  right   LUMBAR PERCUTANEOUS PEDICLE SCREW 2 LEVEL  10/26/2017   Procedure: LUMBAR THREE- LUMBAR FOUR, LUMBAR FOUR- LUMBAR FIVE PERCUTANEOUS PEDICLE SCREW FIXATION;  Surgeon: Jovita Gamma, MD;  Location: [facility];  Service: Neurosurgery;;   MOHS SURGERY     procedure to remove basal cell   SKIN SPLIT GRAFT Left 07/01/2021   Procedure: SKIN GRAFT SPLIT THICKNESS;  Surgeon: Cindra Presume, MD;  Location: [facility];  Service: Plastics;  Laterality: Left;  1 hour   TONSILLECTOMY AND ADENOIDECTOMY     URETHRAL SLING  2007   URETHRAL SLING  1/12   midurethral    vertebroplasty secondary to traumatic compression fracture     Family  History  Problem Relation Age of Onset   Congestive Heart Failure Mother    Thyroid disease Mother    Osteoporosis Mother    Alcoholism Mother    Prostate cancer Father    Breast cancer Maternal Grandmother    Hypertension Sister    Thyroid disease Sister    Cancer Sister        brain ?   Rheum arthritis Daughter    Social History   Socioeconomic History   Marital status: Married    Spouse name: Not on file   Number of children: 2   Years of education: [employer]   [facility] education level: Not on file  Occupational History   Occupation: Retired  Tobacco Use   Smoking status:  Former    Types: Cigarettes    Quit date: 08/11/1965    Years since quitting: 57.2   Smokeless tobacco: Never   Tobacco comments:    has not smoked in 86 years   Vaping Use   Vaping Use: Never used  Substance and Sexual Activity   Alcohol use: Not Currently    Alcohol/week: 1.0 standard drink of alcohol    Types: 1 Standard drinks or equivalent per week    Comment: occ    Drug use: No   Sexual activity: Not Currently    Partners: Male    Birth control/protection: Surgical    Comment: TAH  Other Topics Concern   Not on file  Social History Narrative   HSG, attended [employer]   Married '65   1 son '67, 45 Daughter '69; 5 grandchildren. 1 grandson dealing with drugs after divorce of parents. Oldest.    Work: retired Pharmacist, hospital   Marriage in good health   Former smoker   Right-handed   Caffeine: 11/2 cups per day   Social Determinants of Health   Financial Resource Strain: Low Risk  (11/06/2022)   Overall Financial Resource Strain (CARDIA)    Difficulty of Paying Living Expenses: Not hard at all  Food Insecurity: No Food Insecurity (11/06/2022)   Hunger Vital Sign    Worried About Running Out of Food in the Last Year: Never true    Blende in the Last Year: Never true  Transportation Needs: No Transportation Needs (11/06/2022)   PRAPARE - Hydrologist  (Medical): No    Lack of Transportation (Non-Medical): No  Physical Activity: Sufficiently Active (11/06/2022)   Exercise Vital Sign    Days of Exercise per Week: 7 days    Minutes of Exercise per Session: 60 min  Stress: No Stress Concern Present (11/06/2022)   [place]    Feeling of Stress : Not at all  Social Connections: Moderately Integrated (11/06/2022)   Social Connection and Isolation Panel [NHANES]    Frequency of Communication with Friends and Family: More than three times a week    Frequency of Social Gatherings with Friends and Family: More than three times a week    Attends Religious Services: More than 4 times per year    Active Member of [employer] or Organizations: No    Attends Music therapist: Never    Marital Status: Married    Tobacco Counseling Counseling given: Not Answered Tobacco comments: has not smoked in 45 years    Clinical Intake:  Pre-visit preparation completed: Yes        BMI - recorded: 28.15 Nutritional Status: BMI 25 -29 Overweight Nutritional Risks: None Diabetes: No  How often do you need to have someone help you when you read instructions, pamphlets, or other written materials from your doctor or pharmacy?: 1 - Never  Diabetic?no  Interpreter Needed?: No  Information entered by :: Charlott Rakes, LPN   Activities of Daily Living    11/06/2022    9:46 AM  In your present state of health, do you have any difficulty performing the following activities:  Hearing? 1  Comment wears hearing aids  Vision? 0  Difficulty concentrating or making decisions? 0  Walking or climbing stairs? 0  Dressing or bathing? 0  Doing errands, shopping? 0  Preparing Food and eating ? N  Comment eat meals in dinning room  Using the Toilet?  N  In the past six months, have you accidently leaked urine? Y  Comment wears a diaper and pad  Do you have problems with loss of bowel  control? Y  Managing your Medications? N  Managing your Finances? N  Housekeeping or managing your Housekeeping? N    Patient Care Team: Marin Olp, MD as PCP - General (Family Medicine) Sherren Mocha, MD as PCP - Cardiology (Cardiology) Edythe Clarity, [facility] (Pharmacist) Elease Hashimoto (Neurology)  Indicate any recent Medical Services you may have received from other than [facility] providers in the past year (date may be approximate).     Assessment:   This is a routine wellness examination for [name].  Hearing/Vision screen Hearing Screening - Comments:: Pt has hearing aids  Vision Screening - Comments:: Pt follows up with Dr Ellie Lunch for annual eye exams   Dietary issues and exercise activities discussed: Current Exercise Habits: Home exercise routine, Type of exercise: walking, Time (Minutes): 60, Frequency (Times/Week): 7, Weekly Exercise (Minutes/Week): 420   Goals Addressed             This Visit's Progress    Patient Stated       Walk better       Depression Screen    11/06/2022    9:38 AM 09/26/2022    1:33 PM 06/23/2022    3:02 PM 06/23/2022    2:32 PM 03/24/2022    2:45 PM 10/22/2021    4:19 PM 06/25/2021    3:06 PM  PHQ 2/9 Scores  PHQ - 2 Score 0 0 0 0 0 4 0  PHQ- 9 Score 0 4 1  0 4 0    Fall Risk    11/06/2022    9:45 AM 10/07/2022    1:42 PM 09/26/2022    1:32 PM 06/23/2022    2:31 PM 03/10/2022    8:37 AM  Fall Risk   Falls in the past year? 1 1 1 1 1   Number falls in past yr: 1 1 1 1 1   Injury with Fall? 1 1 1 1 1   Risk for fall due to : Impaired balance/gait;Impaired mobility;Impaired vision;History of fall(s)  History of fall(s)  History of fall(s)  Follow up Falls prevention discussed Falls evaluation completed Falls evaluation completed  Falls evaluation completed;Education provided;Falls prevention discussed    FALL RISK PREVENTION PERTAINING TO THE HOME:  Any stairs in or around the home? No  If so, are there any without  handrails? No  Home free of loose throw rugs in walkways, pet beds, electrical cords, etc? Yes  Adequate lighting in your home to reduce risk of falls? Yes   ASSISTIVE DEVICES UTILIZED TO PREVENT FALLS:  Life alert? Yes  Use of a cane, walker or w/c? Yes  Grab bars in the bathroom? Yes  Shower chair or bench in shower? Yes  Elevated toilet seat or a handicapped toilet? Yes   TIMED UP AND GO:  Was the test performed? No .   Cognitive Function:    03/25/2017    4:19 PM  MMSE - Mini Mental State Exam  Orientation to time 5  Orientation to Place 5  Registration 3  Attention/ Calculation 5  Recall 3  Language- name 2 objects 2  Language- follow 3 step command 3  Language- read & follow direction 1  Write a sentence 1  Copy design 1      10/15/2022    3:00 PM 10/07/2022    5:00 PM  Montreal Cognitive Assessment   Visuospatial/ Executive (0/5) 3 4  Naming (0/3) 3 3  Attention: Read list of digits (0/2) 2 2  Attention: Read list of letters (0/1) 1 1  Attention: Serial 7 subtraction starting at 100 (0/3) 3 3  Language: Repeat phrase (0/2) 2 2  Language : Fluency (0/1) 1 1  Abstraction (0/2) 1 1  Delayed Recall (0/5) 4 4  Orientation (0/6) 5 5  Total 25 26  Adjusted Score (based on education) 25 26      11/10/2022    7:41 AM 04/23/2020   12:05 PM 04/04/2019    2:30 PM  6CIT Screen  What Year? 0 points 0 points 0 points  What month? 0 points 0 points 0 points  What time? 0 points  0 points  Count back from 20 0 points 0 points 0 points  Months in reverse 0 points 0 points 0 points  Repeat phrase 0 points 0 points 0 points  Total Score 0 points  0 points    Immunizations Immunization History  Administered Date(s) Administered   Covid-19, Mrna,Vaccine(Spikevax)61yrs and older 06/11/2022   Fluad Quad(high Dose 65+) 05/26/2019, 06/18/2020, 05/15/2022   Influenza Whole 07/18/2004, 04/25/2010, 04/28/2010   Influenza, High Dose Seasonal PF 08/26/2013, 04/19/2014,  04/04/2016, 05/21/2021   Influenza-Unspecified 05/11/2012, 04/03/2015, 04/13/2017   PFIZER Comirnaty(Gray Top)Covid-19 Tri-Sucrose Vaccine 01/10/2021   PFIZER(Purple Top)SARS-COV-2 Vaccination 09/09/2019, 09/30/2019, 05/24/2020   Pfizer Covid-19 Vaccine Bivalent Booster 80yrs & up 05/21/2021   Pneumococcal Conjugate-13 10/08/2015   Pneumococcal Polysaccharide-23 08/11/2006   Td 03/09/2008   Tdap 04/03/2015, 12/17/2021   Zoster Recombinat (Shingrix) 12/17/2016   Zoster, Live 08/11/2009    TDAP status: Up to date  Flu Vaccine status: Up to date  Pneumococcal vaccine status: Up to date  Covid-19 vaccine status: Completed vaccines  Qualifies for Shingles Vaccine? Yes   Zostavax completed Yes   Shingrix Completed?: No.    Education has been provided regarding the importance of this vaccine. Patient has been advised to call insurance company to determine out of pocket expense if they have not yet received this vaccine. Advised may also receive vaccine at local pharmacy or Health Dept. Verbalized acceptance and understanding.  Screening Tests Health Maintenance  Topic Date Due   Zoster Vaccines- Shingrix (2 of 2) 02/11/2017   INFLUENZA VACCINE  03/12/2023   Medicare Annual Wellness (AWV)  11/06/2023   DTaP/Tdap/Td (4 - Td or Tdap) 12/18/2031   Pneumonia Vaccine 32+ Years old  Completed   DEXA SCAN  Completed   COVID-19 Vaccine  Completed   HPV VACCINES  Aged Out    Health Maintenance  Health Maintenance Due  Topic Date Due   Zoster Vaccines- Shingrix (2 of 2) 02/11/2017    Colorectal cancer screening: No longer required.   Mammogram status: No longer required due to age .  Additional Screening:   Vision Screening: Recommended annual ophthalmology exams for early detection of glaucoma and other disorders of the eye. Is the patient up to date with their annual eye exam?  Yes  Who is the provider or what is the name of the office in which the patient attends annual eye  exams? [facility]  If pt is not established with a provider, would they like to be referred to a provider to establish care? No .   Dental Screening: Recommended annual dental exams for proper oral hygiene  Community Resource Referral / Chronic Care Management: CRR required this visit?  No   CCM required this  visit?  No      Plan:     I have personally reviewed and noted the following in the patient's chart:   Medical and social history Use of alcohol, tobacco or illicit drugs  Current medications and supplements including opioid prescriptions. Patient is not currently taking opioid prescriptions. Functional ability and status Nutritional status Physical activity Advanced directives List of other physicians Hospitalizations, surgeries, and ER visits in previous 12 months Vitals Screenings to include cognitive, depression, and falls Referrals and appointments  In addition, I have reviewed and discussed with patient certain preventive protocols, quality metrics, and best practice recommendations. A written personalized care plan for preventive services as well as general preventive health recommendations were provided to patient.     Willette Brace, LPN   [id number]   Nurse Notes:pt completed on 11/06/22 cognition didn't pull over

## 2022-11-11 DIAGNOSIS — Z9181 History of falling: Secondary | ICD-10-CM | POA: Diagnosis not present

## 2022-11-11 DIAGNOSIS — M62551 Muscle wasting and atrophy, not elsewhere classified, right thigh: Secondary | ICD-10-CM | POA: Diagnosis not present

## 2022-11-11 DIAGNOSIS — R488 Other symbolic dysfunctions: Secondary | ICD-10-CM | POA: Diagnosis not present

## 2022-11-11 DIAGNOSIS — R41841 Cognitive communication deficit: Secondary | ICD-10-CM | POA: Diagnosis not present

## 2022-11-11 DIAGNOSIS — M62552 Muscle wasting and atrophy, not elsewhere classified, left thigh: Secondary | ICD-10-CM | POA: Diagnosis not present

## 2022-11-11 DIAGNOSIS — R2689 Other abnormalities of gait and mobility: Secondary | ICD-10-CM | POA: Diagnosis not present

## 2022-11-11 DIAGNOSIS — M62561 Muscle wasting and atrophy, not elsewhere classified, right lower leg: Secondary | ICD-10-CM | POA: Diagnosis not present

## 2022-11-11 DIAGNOSIS — M62562 Muscle wasting and atrophy, not elsewhere classified, left lower leg: Secondary | ICD-10-CM | POA: Diagnosis not present

## 2022-11-11 DIAGNOSIS — R2681 Unsteadiness on feet: Secondary | ICD-10-CM | POA: Diagnosis not present

## 2022-11-12 DIAGNOSIS — R2681 Unsteadiness on feet: Secondary | ICD-10-CM | POA: Diagnosis not present

## 2022-11-12 DIAGNOSIS — M62561 Muscle wasting and atrophy, not elsewhere classified, right lower leg: Secondary | ICD-10-CM | POA: Diagnosis not present

## 2022-11-12 DIAGNOSIS — R488 Other symbolic dysfunctions: Secondary | ICD-10-CM | POA: Diagnosis not present

## 2022-11-12 DIAGNOSIS — M62551 Muscle wasting and atrophy, not elsewhere classified, right thigh: Secondary | ICD-10-CM | POA: Diagnosis not present

## 2022-11-12 DIAGNOSIS — Z9181 History of falling: Secondary | ICD-10-CM | POA: Diagnosis not present

## 2022-11-12 DIAGNOSIS — R41841 Cognitive communication deficit: Secondary | ICD-10-CM | POA: Diagnosis not present

## 2022-11-12 DIAGNOSIS — R2689 Other abnormalities of gait and mobility: Secondary | ICD-10-CM | POA: Diagnosis not present

## 2022-11-12 DIAGNOSIS — M62562 Muscle wasting and atrophy, not elsewhere classified, left lower leg: Secondary | ICD-10-CM | POA: Diagnosis not present

## 2022-11-12 DIAGNOSIS — M62552 Muscle wasting and atrophy, not elsewhere classified, left thigh: Secondary | ICD-10-CM | POA: Diagnosis not present

## 2022-11-14 DIAGNOSIS — M62561 Muscle wasting and atrophy, not elsewhere classified, right lower leg: Secondary | ICD-10-CM | POA: Diagnosis not present

## 2022-11-14 DIAGNOSIS — R2681 Unsteadiness on feet: Secondary | ICD-10-CM | POA: Diagnosis not present

## 2022-11-14 DIAGNOSIS — R488 Other symbolic dysfunctions: Secondary | ICD-10-CM | POA: Diagnosis not present

## 2022-11-14 DIAGNOSIS — Z9181 History of falling: Secondary | ICD-10-CM | POA: Diagnosis not present

## 2022-11-14 DIAGNOSIS — M62552 Muscle wasting and atrophy, not elsewhere classified, left thigh: Secondary | ICD-10-CM | POA: Diagnosis not present

## 2022-11-14 DIAGNOSIS — M62551 Muscle wasting and atrophy, not elsewhere classified, right thigh: Secondary | ICD-10-CM | POA: Diagnosis not present

## 2022-11-14 DIAGNOSIS — R41841 Cognitive communication deficit: Secondary | ICD-10-CM | POA: Diagnosis not present

## 2022-11-14 DIAGNOSIS — R2689 Other abnormalities of gait and mobility: Secondary | ICD-10-CM | POA: Diagnosis not present

## 2022-11-14 DIAGNOSIS — M62562 Muscle wasting and atrophy, not elsewhere classified, left lower leg: Secondary | ICD-10-CM | POA: Diagnosis not present

## 2022-11-17 DIAGNOSIS — R41841 Cognitive communication deficit: Secondary | ICD-10-CM | POA: Diagnosis not present

## 2022-11-17 DIAGNOSIS — M62552 Muscle wasting and atrophy, not elsewhere classified, left thigh: Secondary | ICD-10-CM | POA: Diagnosis not present

## 2022-11-17 DIAGNOSIS — R2681 Unsteadiness on feet: Secondary | ICD-10-CM | POA: Diagnosis not present

## 2022-11-17 DIAGNOSIS — Z9181 History of falling: Secondary | ICD-10-CM | POA: Diagnosis not present

## 2022-11-17 DIAGNOSIS — R488 Other symbolic dysfunctions: Secondary | ICD-10-CM | POA: Diagnosis not present

## 2022-11-17 DIAGNOSIS — M62562 Muscle wasting and atrophy, not elsewhere classified, left lower leg: Secondary | ICD-10-CM | POA: Diagnosis not present

## 2022-11-17 DIAGNOSIS — R2689 Other abnormalities of gait and mobility: Secondary | ICD-10-CM | POA: Diagnosis not present

## 2022-11-17 DIAGNOSIS — M62551 Muscle wasting and atrophy, not elsewhere classified, right thigh: Secondary | ICD-10-CM | POA: Diagnosis not present

## 2022-11-17 DIAGNOSIS — M62561 Muscle wasting and atrophy, not elsewhere classified, right lower leg: Secondary | ICD-10-CM | POA: Diagnosis not present

## 2022-11-18 DIAGNOSIS — Z9181 History of falling: Secondary | ICD-10-CM | POA: Diagnosis not present

## 2022-11-18 DIAGNOSIS — M62561 Muscle wasting and atrophy, not elsewhere classified, right lower leg: Secondary | ICD-10-CM | POA: Diagnosis not present

## 2022-11-18 DIAGNOSIS — R488 Other symbolic dysfunctions: Secondary | ICD-10-CM | POA: Diagnosis not present

## 2022-11-18 DIAGNOSIS — M62562 Muscle wasting and atrophy, not elsewhere classified, left lower leg: Secondary | ICD-10-CM | POA: Diagnosis not present

## 2022-11-18 DIAGNOSIS — R41841 Cognitive communication deficit: Secondary | ICD-10-CM | POA: Diagnosis not present

## 2022-11-18 DIAGNOSIS — R2681 Unsteadiness on feet: Secondary | ICD-10-CM | POA: Diagnosis not present

## 2022-11-18 DIAGNOSIS — M62552 Muscle wasting and atrophy, not elsewhere classified, left thigh: Secondary | ICD-10-CM | POA: Diagnosis not present

## 2022-11-18 DIAGNOSIS — M62551 Muscle wasting and atrophy, not elsewhere classified, right thigh: Secondary | ICD-10-CM | POA: Diagnosis not present

## 2022-11-18 DIAGNOSIS — R2689 Other abnormalities of gait and mobility: Secondary | ICD-10-CM | POA: Diagnosis not present

## 2022-11-19 DIAGNOSIS — R41841 Cognitive communication deficit: Secondary | ICD-10-CM | POA: Diagnosis not present

## 2022-11-19 DIAGNOSIS — Z9181 History of falling: Secondary | ICD-10-CM | POA: Diagnosis not present

## 2022-11-19 DIAGNOSIS — M62562 Muscle wasting and atrophy, not elsewhere classified, left lower leg: Secondary | ICD-10-CM | POA: Diagnosis not present

## 2022-11-19 DIAGNOSIS — M62552 Muscle wasting and atrophy, not elsewhere classified, left thigh: Secondary | ICD-10-CM | POA: Diagnosis not present

## 2022-11-19 DIAGNOSIS — R2681 Unsteadiness on feet: Secondary | ICD-10-CM | POA: Diagnosis not present

## 2022-11-19 DIAGNOSIS — M62551 Muscle wasting and atrophy, not elsewhere classified, right thigh: Secondary | ICD-10-CM | POA: Diagnosis not present

## 2022-11-19 DIAGNOSIS — R488 Other symbolic dysfunctions: Secondary | ICD-10-CM | POA: Diagnosis not present

## 2022-11-19 DIAGNOSIS — M62561 Muscle wasting and atrophy, not elsewhere classified, right lower leg: Secondary | ICD-10-CM | POA: Diagnosis not present

## 2022-11-19 DIAGNOSIS — R2689 Other abnormalities of gait and mobility: Secondary | ICD-10-CM | POA: Diagnosis not present

## 2022-11-21 DIAGNOSIS — R41841 Cognitive communication deficit: Secondary | ICD-10-CM | POA: Diagnosis not present

## 2022-11-21 DIAGNOSIS — R488 Other symbolic dysfunctions: Secondary | ICD-10-CM | POA: Diagnosis not present

## 2022-11-21 DIAGNOSIS — M62561 Muscle wasting and atrophy, not elsewhere classified, right lower leg: Secondary | ICD-10-CM | POA: Diagnosis not present

## 2022-11-21 DIAGNOSIS — M62551 Muscle wasting and atrophy, not elsewhere classified, right thigh: Secondary | ICD-10-CM | POA: Diagnosis not present

## 2022-11-21 DIAGNOSIS — M62562 Muscle wasting and atrophy, not elsewhere classified, left lower leg: Secondary | ICD-10-CM | POA: Diagnosis not present

## 2022-11-21 DIAGNOSIS — Z9181 History of falling: Secondary | ICD-10-CM | POA: Diagnosis not present

## 2022-11-21 DIAGNOSIS — R2681 Unsteadiness on feet: Secondary | ICD-10-CM | POA: Diagnosis not present

## 2022-11-21 DIAGNOSIS — M62552 Muscle wasting and atrophy, not elsewhere classified, left thigh: Secondary | ICD-10-CM | POA: Diagnosis not present

## 2022-11-21 DIAGNOSIS — R2689 Other abnormalities of gait and mobility: Secondary | ICD-10-CM | POA: Diagnosis not present

## 2022-11-24 ENCOUNTER — Other Ambulatory Visit: Payer: Self-pay | Admitting: Family

## 2022-11-24 ENCOUNTER — Telehealth: Payer: Self-pay | Admitting: Family Medicine

## 2022-11-24 DIAGNOSIS — R2681 Unsteadiness on feet: Secondary | ICD-10-CM | POA: Diagnosis not present

## 2022-11-24 DIAGNOSIS — R488 Other symbolic dysfunctions: Secondary | ICD-10-CM | POA: Diagnosis not present

## 2022-11-24 DIAGNOSIS — R2689 Other abnormalities of gait and mobility: Secondary | ICD-10-CM | POA: Diagnosis not present

## 2022-11-24 DIAGNOSIS — N3090 Cystitis, unspecified without hematuria: Secondary | ICD-10-CM

## 2022-11-24 DIAGNOSIS — M62562 Muscle wasting and atrophy, not elsewhere classified, left lower leg: Secondary | ICD-10-CM | POA: Diagnosis not present

## 2022-11-24 DIAGNOSIS — R41841 Cognitive communication deficit: Secondary | ICD-10-CM | POA: Diagnosis not present

## 2022-11-24 DIAGNOSIS — M62551 Muscle wasting and atrophy, not elsewhere classified, right thigh: Secondary | ICD-10-CM | POA: Diagnosis not present

## 2022-11-24 DIAGNOSIS — M62552 Muscle wasting and atrophy, not elsewhere classified, left thigh: Secondary | ICD-10-CM | POA: Diagnosis not present

## 2022-11-24 DIAGNOSIS — Z9181 History of falling: Secondary | ICD-10-CM | POA: Diagnosis not present

## 2022-11-24 DIAGNOSIS — M62561 Muscle wasting and atrophy, not elsewhere classified, right lower leg: Secondary | ICD-10-CM | POA: Diagnosis not present

## 2022-11-24 NOTE — Telephone Encounter (Signed)
An available provider is fine.

## 2022-11-24 NOTE — Telephone Encounter (Signed)
Pt will need OV to be reassessed.

## 2022-11-24 NOTE — Telephone Encounter (Signed)
Patient states following OV with [name] on 3/28 she took prescribed Macrobid 100 mg and symptoms improved. States currently feeling burning with urination.   I offered patient to come in for re-evaluation but she states she is not sure she can get someone to bring her. Pt requests refill. Please Advise.

## 2022-11-24 NOTE — Telephone Encounter (Signed)
Patient called to get scheduled. States she is only able to get her on 4/18 sometime between 9 am -11 am. Can she be worked in or just see another provider? Please Advise.

## 2022-11-25 DIAGNOSIS — Z9181 History of falling: Secondary | ICD-10-CM | POA: Diagnosis not present

## 2022-11-25 DIAGNOSIS — R41841 Cognitive communication deficit: Secondary | ICD-10-CM | POA: Diagnosis not present

## 2022-11-25 DIAGNOSIS — M62561 Muscle wasting and atrophy, not elsewhere classified, right lower leg: Secondary | ICD-10-CM | POA: Diagnosis not present

## 2022-11-25 DIAGNOSIS — R488 Other symbolic dysfunctions: Secondary | ICD-10-CM | POA: Diagnosis not present

## 2022-11-25 DIAGNOSIS — M62551 Muscle wasting and atrophy, not elsewhere classified, right thigh: Secondary | ICD-10-CM | POA: Diagnosis not present

## 2022-11-25 DIAGNOSIS — M62562 Muscle wasting and atrophy, not elsewhere classified, left lower leg: Secondary | ICD-10-CM | POA: Diagnosis not present

## 2022-11-25 DIAGNOSIS — R2689 Other abnormalities of gait and mobility: Secondary | ICD-10-CM | POA: Diagnosis not present

## 2022-11-25 DIAGNOSIS — R2681 Unsteadiness on feet: Secondary | ICD-10-CM | POA: Diagnosis not present

## 2022-11-25 DIAGNOSIS — M62552 Muscle wasting and atrophy, not elsewhere classified, left thigh: Secondary | ICD-10-CM | POA: Diagnosis not present

## 2022-11-26 DIAGNOSIS — M62552 Muscle wasting and atrophy, not elsewhere classified, left thigh: Secondary | ICD-10-CM | POA: Diagnosis not present

## 2022-11-26 DIAGNOSIS — R2689 Other abnormalities of gait and mobility: Secondary | ICD-10-CM | POA: Diagnosis not present

## 2022-11-26 DIAGNOSIS — M62561 Muscle wasting and atrophy, not elsewhere classified, right lower leg: Secondary | ICD-10-CM | POA: Diagnosis not present

## 2022-11-26 DIAGNOSIS — R2681 Unsteadiness on feet: Secondary | ICD-10-CM | POA: Diagnosis not present

## 2022-11-26 DIAGNOSIS — R41841 Cognitive communication deficit: Secondary | ICD-10-CM | POA: Diagnosis not present

## 2022-11-26 DIAGNOSIS — M62551 Muscle wasting and atrophy, not elsewhere classified, right thigh: Secondary | ICD-10-CM | POA: Diagnosis not present

## 2022-11-26 DIAGNOSIS — Z9181 History of falling: Secondary | ICD-10-CM | POA: Diagnosis not present

## 2022-11-26 DIAGNOSIS — R488 Other symbolic dysfunctions: Secondary | ICD-10-CM | POA: Diagnosis not present

## 2022-11-26 DIAGNOSIS — M62562 Muscle wasting and atrophy, not elsewhere classified, left lower leg: Secondary | ICD-10-CM | POA: Diagnosis not present

## 2022-11-27 ENCOUNTER — Ambulatory Visit (INDEPENDENT_AMBULATORY_CARE_PROVIDER_SITE_OTHER): Payer: Medicare HMO | Admitting: Family

## 2022-11-27 ENCOUNTER — Encounter: Payer: Self-pay | Admitting: Family

## 2022-11-27 VITALS — BP 141/76 | HR 69 | Temp 97.7°F | Ht 60.0 in | Wt 144.0 lb

## 2022-11-27 DIAGNOSIS — N390 Urinary tract infection, site not specified: Secondary | ICD-10-CM | POA: Diagnosis not present

## 2022-11-27 DIAGNOSIS — R2681 Unsteadiness on feet: Secondary | ICD-10-CM | POA: Diagnosis not present

## 2022-11-27 DIAGNOSIS — R2689 Other abnormalities of gait and mobility: Secondary | ICD-10-CM | POA: Diagnosis not present

## 2022-11-27 DIAGNOSIS — R300 Dysuria: Secondary | ICD-10-CM

## 2022-11-27 DIAGNOSIS — M62552 Muscle wasting and atrophy, not elsewhere classified, left thigh: Secondary | ICD-10-CM | POA: Diagnosis not present

## 2022-11-27 DIAGNOSIS — M62551 Muscle wasting and atrophy, not elsewhere classified, right thigh: Secondary | ICD-10-CM | POA: Diagnosis not present

## 2022-11-27 DIAGNOSIS — R41841 Cognitive communication deficit: Secondary | ICD-10-CM | POA: Diagnosis not present

## 2022-11-27 DIAGNOSIS — M62562 Muscle wasting and atrophy, not elsewhere classified, left lower leg: Secondary | ICD-10-CM | POA: Diagnosis not present

## 2022-11-27 DIAGNOSIS — R488 Other symbolic dysfunctions: Secondary | ICD-10-CM | POA: Diagnosis not present

## 2022-11-27 DIAGNOSIS — Z9181 History of falling: Secondary | ICD-10-CM | POA: Diagnosis not present

## 2022-11-27 DIAGNOSIS — M62561 Muscle wasting and atrophy, not elsewhere classified, right lower leg: Secondary | ICD-10-CM | POA: Diagnosis not present

## 2022-11-27 MED ORDER — NITROFURANTOIN MACROCRYSTAL 50 MG PO CAPS: 50.0000 mg | ORAL_CAPSULE | ORAL | 0 refills | Status: DC

## 2022-11-27 NOTE — Progress Notes (Signed)
Patient ID: [name], female    DOB: Jun 18, 1936, 87 y.o.   MRN: [id number]  Chief Complaint  Patient presents with   Vaginal Pain    Pt c/o vaginal pain, itching and burning during urination. Present after finished the antibiotic.     HPI:      UTI sx:  last tx w/Macrobid on 3/27, completed course & sx improved, now have returned exactly as before with burning pain prior to urination and during. Denies pain after urination. Denies itching or pain with wiping. Mild pelvic pain, no back pain, nausea, or fever. Pt is incontinent and has chronic urgency.  Pt also has chronic loose stools w/ occasional incontinence. Has seen Urology in past most recently in Jan and per review of office notes she stated to them that she was not using the Estrace cream or Mannitol; & they had prescribed daily low dose Keflex, but pt was not remembering to take this, but she remembers to take her other meds daily.    Assessment & Plan:  1. Dysuria - pt unable to void for urine sample today. Will treat based on previous sx, see recurrent dx below.  2. Recurrent UTI - no specimen today d/t inability to void; sending Macrobid again for 3 days, and then advised to continue taking 1 pill daily as prophylactic. Hoping by giving same med dose in same bottle will help w/compliance. Last GFR wnl. Advised pt on importance of continuing water intake qd, at least 1 liter, aiming for 2L per day. Advised she should f/u with PCP q2-71m to monitor renal status.  - nitrofurantoin (MACRODANTIN) 50 MG capsule; Take 1 capsule (50 mg total) by mouth as directed. Take 2 capsules twice a day for 3 days, then 1 capsule daily.  Dispense: 90 capsule; Refill: 0  Subjective:    Outpatient Medications Prior to Visit  Medication Sig Dispense Refill   brimonidine-timolol (COMBIGAN) 0.2-0.5 % ophthalmic solution Place 1 drop into both eyes every 12 (twelve) hours.     ezetimibe-simvastatin (VYTORIN) 10-20 MG tablet TAKE 1 TABLET BY MOUTH  EVERY DAY 90 tablet 2   Multiple Vitamins-Minerals (MULTIVITAMIN ADULT) CHEW Chew 2 each by mouth daily. Nature Made for Her     nadolol (CORGARD) 20 MG tablet Take 1 tablet (20 mg total) by mouth at bedtime. 90 tablet 3   omeprazole (PRILOSEC) 20 MG capsule Take 20 mg by mouth daily. In the morning for acid reflux over the counter     traZODone (DESYREL) 100 MG tablet TAKE 1/4 TO 1/2 TABLET BY MOUTH AT BEDTIME IF NEEDED FOR SLEEP. 45 tablet 3   venlafaxine XR (EFFEXOR XR) 75 MG 24 hr capsule Take 1 capsule (75 mg total) by mouth daily before supper. 30 capsule 5   venlafaxine XR (EFFEXOR-XR) 150 MG 24 hr capsule Take 1 capsule (150 mg total) by mouth daily with breakfast. 90 capsule 3   nitrofurantoin, macrocrystal-monohydrate, (MACROBID) 100 MG capsule Take 1 capsule (100 mg total) by mouth 2 (two) times daily. 14 capsule 0   No facility-administered medications prior to visit.   Past Medical History:  Diagnosis Date   Anxiety    Cancer    basal cell on nose   Colon polyps    Complication of anesthesia    takes longer to wake up- admitted overnight after colonoscopy   COVID 05/2021   mild case   Depression    GERD (gastroesophageal reflux disease)    Glaucoma    Headache  migraines   Hx: UTI (urinary tract infection)    Hypercholesteremia    Hypertension    Lichen simplex chronicus    Lumbar stenosis with neurogenic claudication    Macular degeneration    Medical history non-contributory    Osteoarthritis    Osteopenia    of the hip   PONV (postoperative nausea and vomiting)    Stress incontinence, female    Unsteady gait    Wears glasses    Whooping cough    as a baby   Past Surgical History:  Procedure Laterality Date   ABDOMINAL HYSTERECTOMY  1977   ovaries remain   ANTERIOR LAT LUMBAR FUSION N/A 10/26/2017   Procedure: LUMBAR THREE- LUMBAR FOUR, LUMBAR FOUR- LUMBAR FIVE ANTEROLATERAL LUMBAR INTERBODY ARTHRODESIS;  Surgeon: Shirlean Kelly, MD;  Location: [facility] OR;   Service: Neurosurgery;  Laterality: N/A;  LUMBAR 3- LUMBAR 4, LUMBAR 4- LUMBAR 5 ANTEROLATERAL LUMBAR INTERBODY ARTHRODESIS, LUMBAR 3- LUMBAR 4, LUMBAR 4- LUMBAR 5 PERCUTANEOUS PEDICLE SCREW FIXATION   APPENDECTOMY     APPLICATION OF WOUND VAC Left 04/27/2021   Procedure: APPLICATION OF INCISIONAL WOUND VAC;  Surgeon: Terance Hart, MD;  Location: [facility] OR;  Service: Orthopedics;  Laterality: Left;   APPLICATION OF WOUND VAC Left 07/01/2021   Procedure: APPLICATION OF WOUND VAC;  Surgeon: Allena Napoleon, MD;  Location: [facility] OR;  Service: Plastics;  Laterality: Left;   BLADDER REPAIR     x two   CHOLECYSTECTOMY     COLONOSCOPY     EYE SURGERY  11/14, 12/14   Macular Dengeneration, Glaucoma   EYE SURGERY  2016   left eye   I & D EXTREMITY Left 04/27/2021   Procedure: IRRIGATION AND DEBRIDEMENT LEFT LEG;  Surgeon: Terance Hart, MD;  Location: [facility] OR;  Service: Orthopedics;  Laterality: Left;   INCISION AND DRAINAGE OF WOUND Right 04/27/2021   Procedure: IRRIGATION AND DEBRIDEMENT WOUND;  Surgeon: Terance Hart, MD;  Location: [facility] OR;  Service: Orthopedics;  Laterality: Right;   LESION REMOVAL Right 06/28/2013   Procedure: EXCISION 3 cm right labial sebaceous cyst;  Surgeon: Annamaria Boots, MD;  Location: [facility] ORS;  Service: Gynecology;  Laterality: Right;   LUMBAR LAMINECTOMY/DECOMPRESSION MICRODISCECTOMY Right 03/20/2016   Procedure: Right - Lumbar four-five lumbar laminotomy, foraminotomy, and possible microdiscectomy;  Surgeon: Shirlean Kelly, MD;  Location: [facility];  Service: Neurosurgery;  Laterality: Right;  right   LUMBAR PERCUTANEOUS PEDICLE SCREW 2 LEVEL  10/26/2017   Procedure: LUMBAR THREE- LUMBAR FOUR, LUMBAR FOUR- LUMBAR FIVE PERCUTANEOUS PEDICLE SCREW FIXATION;  Surgeon: Shirlean Kelly, MD;  Location: [facility] OR;  Service: Neurosurgery;;   MOHS SURGERY     procedure to remove basal cell   SKIN SPLIT GRAFT Left 07/01/2021   Procedure: SKIN GRAFT SPLIT THICKNESS;   Surgeon: Allena Napoleon, MD;  Location: [facility] OR;  Service: Plastics;  Laterality: Left;  1 hour   TONSILLECTOMY AND ADENOIDECTOMY     URETHRAL SLING  2007   URETHRAL SLING  1/12   midurethral    vertebroplasty secondary to traumatic compression fracture     Allergies  Allergen Reactions   Citrate Of Magnesia Other (See Comments)    confusion   Codeine Nausea And Vomiting   Shingrix [Zoster Vac Recomb Adjuvanted] Swelling and Rash    Rash and temp x several days. (low grade 100 -101 )       Objective:    Physical Exam Vitals and nursing note reviewed.  Constitutional:  Appearance: Normal appearance.  Cardiovascular:     Rate and Rhythm: Normal rate and regular rhythm.  Pulmonary:     Effort: Pulmonary effort is normal.     Breath sounds: Normal breath sounds.  Musculoskeletal:        General: Normal range of motion.  Skin:    General: Skin is warm and dry.  Neurological:     Mental Status: She is alert.  Psychiatric:        Mood and Affect: Mood normal.        Behavior: Behavior normal.    BP (!) 141/76 (BP Location: Left Arm, Patient Position: Sitting, Cuff Size: Normal)   Pulse 69   Temp 97.7 F (36.5 C) (Temporal)   Ht 5' (1.524 m)   Wt 144 lb (65.3 kg)   LMP 08/11/1974   SpO2 96%   BMI 28.12 kg/m  Wt Readings from Last 3 Encounters:  11/27/22 144 lb (65.3 kg)  11/05/22 144 lb 2 oz (65.4 kg)  10/07/22 149 lb (67.6 kg)       Dulce Sellar, NP

## 2022-11-28 ENCOUNTER — Ambulatory Visit: Payer: Medicare HMO | Admitting: Physician Assistant

## 2022-12-01 ENCOUNTER — Telehealth: Payer: Self-pay

## 2022-12-01 ENCOUNTER — Ambulatory Visit: Payer: Medicare HMO | Admitting: Physician Assistant

## 2022-12-01 DIAGNOSIS — Z9181 History of falling: Secondary | ICD-10-CM | POA: Diagnosis not present

## 2022-12-01 DIAGNOSIS — M62552 Muscle wasting and atrophy, not elsewhere classified, left thigh: Secondary | ICD-10-CM | POA: Diagnosis not present

## 2022-12-01 DIAGNOSIS — R41841 Cognitive communication deficit: Secondary | ICD-10-CM | POA: Diagnosis not present

## 2022-12-01 DIAGNOSIS — R2689 Other abnormalities of gait and mobility: Secondary | ICD-10-CM | POA: Diagnosis not present

## 2022-12-01 DIAGNOSIS — R2681 Unsteadiness on feet: Secondary | ICD-10-CM | POA: Diagnosis not present

## 2022-12-01 DIAGNOSIS — M62562 Muscle wasting and atrophy, not elsewhere classified, left lower leg: Secondary | ICD-10-CM | POA: Diagnosis not present

## 2022-12-01 DIAGNOSIS — R488 Other symbolic dysfunctions: Secondary | ICD-10-CM | POA: Diagnosis not present

## 2022-12-01 DIAGNOSIS — M62561 Muscle wasting and atrophy, not elsewhere classified, right lower leg: Secondary | ICD-10-CM | POA: Diagnosis not present

## 2022-12-01 DIAGNOSIS — M62551 Muscle wasting and atrophy, not elsewhere classified, right thigh: Secondary | ICD-10-CM | POA: Diagnosis not present

## 2022-12-01 NOTE — Telephone Encounter (Signed)
Pt 's son returned call. He gave a verbalized understanding.

## 2022-12-01 NOTE — Telephone Encounter (Signed)
-----   Message from Dulce Sellar, NP sent at 11/27/2022 12:16 PM EDT ----- Regarding: Call patient Please call [name] and let her know that since I am prescribing an antibiotic daily to prevent future UTIs, it will be important to monitor her kidney function, so I recommend she go ahead and schedule a f/u visit with Dr. Durene Cal in 2 months.

## 2022-12-02 ENCOUNTER — Ambulatory Visit: Payer: Medicare HMO | Admitting: Family

## 2022-12-02 DIAGNOSIS — R41841 Cognitive communication deficit: Secondary | ICD-10-CM | POA: Diagnosis not present

## 2022-12-02 DIAGNOSIS — Z9181 History of falling: Secondary | ICD-10-CM | POA: Diagnosis not present

## 2022-12-02 DIAGNOSIS — M62552 Muscle wasting and atrophy, not elsewhere classified, left thigh: Secondary | ICD-10-CM | POA: Diagnosis not present

## 2022-12-02 DIAGNOSIS — M62562 Muscle wasting and atrophy, not elsewhere classified, left lower leg: Secondary | ICD-10-CM | POA: Diagnosis not present

## 2022-12-02 DIAGNOSIS — R488 Other symbolic dysfunctions: Secondary | ICD-10-CM | POA: Diagnosis not present

## 2022-12-02 DIAGNOSIS — R2689 Other abnormalities of gait and mobility: Secondary | ICD-10-CM | POA: Diagnosis not present

## 2022-12-02 DIAGNOSIS — M62561 Muscle wasting and atrophy, not elsewhere classified, right lower leg: Secondary | ICD-10-CM | POA: Diagnosis not present

## 2022-12-02 DIAGNOSIS — M62551 Muscle wasting and atrophy, not elsewhere classified, right thigh: Secondary | ICD-10-CM | POA: Diagnosis not present

## 2022-12-02 DIAGNOSIS — R2681 Unsteadiness on feet: Secondary | ICD-10-CM | POA: Diagnosis not present

## 2022-12-03 ENCOUNTER — Encounter: Payer: Self-pay | Admitting: Psychology

## 2022-12-03 DIAGNOSIS — M62561 Muscle wasting and atrophy, not elsewhere classified, right lower leg: Secondary | ICD-10-CM | POA: Diagnosis not present

## 2022-12-03 DIAGNOSIS — R41841 Cognitive communication deficit: Secondary | ICD-10-CM | POA: Diagnosis not present

## 2022-12-03 DIAGNOSIS — M62552 Muscle wasting and atrophy, not elsewhere classified, left thigh: Secondary | ICD-10-CM | POA: Diagnosis not present

## 2022-12-03 DIAGNOSIS — R2681 Unsteadiness on feet: Secondary | ICD-10-CM | POA: Diagnosis not present

## 2022-12-03 DIAGNOSIS — R2689 Other abnormalities of gait and mobility: Secondary | ICD-10-CM | POA: Diagnosis not present

## 2022-12-03 DIAGNOSIS — M62551 Muscle wasting and atrophy, not elsewhere classified, right thigh: Secondary | ICD-10-CM | POA: Diagnosis not present

## 2022-12-03 DIAGNOSIS — M62562 Muscle wasting and atrophy, not elsewhere classified, left lower leg: Secondary | ICD-10-CM | POA: Diagnosis not present

## 2022-12-03 DIAGNOSIS — F411 Generalized anxiety disorder: Secondary | ICD-10-CM | POA: Insufficient documentation

## 2022-12-03 DIAGNOSIS — E7800 Pure hypercholesterolemia, unspecified: Secondary | ICD-10-CM | POA: Insufficient documentation

## 2022-12-03 DIAGNOSIS — H5203 Hypermetropia, bilateral: Secondary | ICD-10-CM | POA: Diagnosis not present

## 2022-12-03 DIAGNOSIS — H401123 Primary open-angle glaucoma, left eye, severe stage: Secondary | ICD-10-CM | POA: Diagnosis not present

## 2022-12-03 DIAGNOSIS — R488 Other symbolic dysfunctions: Secondary | ICD-10-CM | POA: Diagnosis not present

## 2022-12-03 DIAGNOSIS — Z961 Presence of intraocular lens: Secondary | ICD-10-CM | POA: Diagnosis not present

## 2022-12-03 DIAGNOSIS — H401112 Primary open-angle glaucoma, right eye, moderate stage: Secondary | ICD-10-CM | POA: Diagnosis not present

## 2022-12-04 ENCOUNTER — Ambulatory Visit: Payer: Medicare HMO

## 2022-12-04 ENCOUNTER — Ambulatory Visit: Payer: Medicare HMO | Admitting: Psychology

## 2022-12-04 ENCOUNTER — Encounter: Payer: Self-pay | Admitting: Psychology

## 2022-12-04 DIAGNOSIS — R441 Visual hallucinations: Secondary | ICD-10-CM | POA: Diagnosis not present

## 2022-12-04 DIAGNOSIS — R2689 Other abnormalities of gait and mobility: Secondary | ICD-10-CM | POA: Diagnosis not present

## 2022-12-04 DIAGNOSIS — F0390 Unspecified dementia without behavioral disturbance: Secondary | ICD-10-CM | POA: Insufficient documentation

## 2022-12-04 DIAGNOSIS — M62561 Muscle wasting and atrophy, not elsewhere classified, right lower leg: Secondary | ICD-10-CM | POA: Diagnosis not present

## 2022-12-04 DIAGNOSIS — R4189 Other symptoms and signs involving cognitive functions and awareness: Secondary | ICD-10-CM

## 2022-12-04 DIAGNOSIS — R2681 Unsteadiness on feet: Secondary | ICD-10-CM | POA: Diagnosis not present

## 2022-12-04 DIAGNOSIS — R488 Other symbolic dysfunctions: Secondary | ICD-10-CM | POA: Diagnosis not present

## 2022-12-04 DIAGNOSIS — M62551 Muscle wasting and atrophy, not elsewhere classified, right thigh: Secondary | ICD-10-CM | POA: Diagnosis not present

## 2022-12-04 DIAGNOSIS — G3184 Mild cognitive impairment, so stated: Secondary | ICD-10-CM

## 2022-12-04 DIAGNOSIS — M62562 Muscle wasting and atrophy, not elsewhere classified, left lower leg: Secondary | ICD-10-CM | POA: Diagnosis not present

## 2022-12-04 DIAGNOSIS — M62552 Muscle wasting and atrophy, not elsewhere classified, left thigh: Secondary | ICD-10-CM | POA: Diagnosis not present

## 2022-12-04 DIAGNOSIS — R41841 Cognitive communication deficit: Secondary | ICD-10-CM | POA: Diagnosis not present

## 2022-12-04 HISTORY — DX: Mild cognitive impairment of uncertain or unknown etiology: G31.84

## 2022-12-04 NOTE — Progress Notes (Signed)
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 appropriate normative data and may be adjusted based on clinical judgment. Terms such as "Within Normal Limits" and "Outside Normal Limits" are used when a more specific description of the test score cannot be determined.       Percentile - Normative Descriptor > 98 - Exceptionally High 91-97 - Well Above Average 75-90 - Above Average 25-74 - Average 9-24 - Below Average 2-8 - Well Below Average < 2 - Exceptionally Low       Orientation:      Raw Score Percentile   NAB Orientation, Form 1 26/29 --- ---       Cognitive Screening:      Raw Score Percentile   SLUMS: 18/30 --- ---       RBANS, Form A: Standard Score/ Scaled Score Percentile   Total Score 90 25 Average  Immediate Memory 81 10 Below Average    List Learning 8 25 Average    Story Memory 5 5 Well Below Average  Visuospatial/Constructional 112 79 Above Average    Figure Copy 14 91 Well Above Average    Line Orientation 16/20 26-50 Average  Language 92 30 Average    Picture Naming 10/10 >75 Above Average    Semantic Fluency 6 9 Below Average  Attention 103 58 Average    Digit Span 10 50 Average    Coding 11 63 Average  Delayed Memory 78 7 Well Below Average    List Recall 1/10 10-16 Below Average    List  Recognition 18/20 17-25 Below Average to Average    Story Recall 5 5 Well Below Average    Story Recognition 9/12 29-52 Average    Figure Recall 7 16 Below Average    Figure Recognition 4/8 21-38 Below Average to Average        Intellectual Functioning:      Standard Score Percentile   Test of Premorbid Functioning: 97 42 Average       Attention/Executive Function:     Trail Making Test (TMT): Raw Score (Scaled Score) Percentile     Part A 50 secs.,  1 error (10) 50 Average    Part B 206 secs.,  4 errors (7) 16 Below Average  *  Based on [employer] Older Normative Studies (MOANS)          Language:     Verbal Fluency Test: Raw Score (Scaled Score) Percentile     Phonemic Fluency (CFL) 31 (10) 50 Average    Category Fluency 27 (7) 16 Below Average  *Based on [employer] Older Normative Studies (MOANS)          NAB Language Module, Form 1: T Score Percentile     Naming 26/31 (39) 14 Below Average       Visuospatial/Visuoconstruction:      Raw Score Percentile   Clock Drawing: 8/10 --- Within Normal Limits       NAB Spatial Module, Form 1: T Score Percentile     Visual Discrimination 51 54 Average        Scaled Score Percentile   WAIS-IV Block Design: 9 37 Average       Mood and Personality:      Raw Score Percentile   Geriatric Depression Scale: 8 --- Within Normal Limits  Geriatric Anxiety Scale: 18 --- Mild    Somatic 4 --- Minimal    Cognitive 5 --- Mild    Affective 9 --- Severe       Additional Questionnaires:      Raw Score Percentile   PROMIS Sleep Disturbance Questionnaire: 8 --- None to Slight   Informed Consent and Coding/Compliance:   The current evaluation represents a clinical evaluation for the purposes previously outlined by the referral source and is in no way reflective of a forensic evaluation.   Ms. Mclarty was provided with a verbal description of the nature and purpose of the present neuropsychological evaluation. Also reviewed were the foreseeable risks  and/or discomforts and benefits of the procedure, limits of confidentiality, and mandatory reporting requirements of this provider. The patient was given the opportunity to ask questions and receive answers about the evaluation. Oral consent to participate was provided by the patient.   This evaluation was conducted by Newman Nickels, Ph.D., ABPP-CN, board certified clinical neuropsychologist. Ms. Monteith completed a clinical interview with Dr. Milbert Coulter, billed as one unit [phone], and 130 minutes of cognitive testing and scoring, billed as one unit [phone] and three additional units [id number]. Psychometrist Shan Levans, B.S., assisted Dr. Milbert Coulter with test administration and scoring procedures. As a separate and discrete service, Dr. Milbert Coulter spent a total of 160 minutes in interpretation and report writing billed as one unit [phone] and two units [id number].

## 2022-12-04 NOTE — Progress Notes (Signed)
   Psychometrician Note   Cognitive testing was administered to [name] by Shan Levans, B.S. (psychometrist) under the supervision of Dr. Newman Nickels, Ph.D., licensed psychologist on 12/04/2022. [name] did not appear overtly distressed by the testing session per behavioral observation or responses across self-report questionnaires. Rest breaks were offered.    The battery of tests administered was selected by Dr. Newman Nickels, Ph.D. with consideration to [name]'s current level of functioning, the nature of her symptoms, emotional and behavioral responses during interview, level of literacy, observed level of motivation/effort, and the nature of the referral question. This battery was communicated to the psychometrist. Communication between Dr. Newman Nickels, Ph.D. and the psychometrist was ongoing throughout the evaluation and Dr. Newman Nickels, Ph.D. was immediately accessible at all times. Dr. Newman Nickels, Ph.D. provided supervision to the psychometrist on the date of this service to the extent necessary to assure the quality of all services provided.    [name] will return within approximately 1-2 weeks for an interactive feedback session with Dr. Milbert Coulter at which time her test performances, clinical impressions, and treatment recommendations will be reviewed in detail. [name] understands she can contact our office should she require our assistance before this time.  A total of 130 minutes of billable time were spent face-to-face with [name] by the psychometrist. This includes both test administration and scoring time. Billing for these services is reflected in the clinical report generated by Dr. Newman Nickels, Ph.D.  This note reflects time spent with the psychometrician and does not include test scores or any clinical interpretations made by Dr. Milbert Coulter. The full report will follow in a separate note.

## 2022-12-05 DIAGNOSIS — M62561 Muscle wasting and atrophy, not elsewhere classified, right lower leg: Secondary | ICD-10-CM | POA: Diagnosis not present

## 2022-12-05 DIAGNOSIS — R2681 Unsteadiness on feet: Secondary | ICD-10-CM | POA: Diagnosis not present

## 2022-12-05 DIAGNOSIS — R41841 Cognitive communication deficit: Secondary | ICD-10-CM | POA: Diagnosis not present

## 2022-12-05 DIAGNOSIS — R2689 Other abnormalities of gait and mobility: Secondary | ICD-10-CM | POA: Diagnosis not present

## 2022-12-05 DIAGNOSIS — R488 Other symbolic dysfunctions: Secondary | ICD-10-CM | POA: Diagnosis not present

## 2022-12-05 DIAGNOSIS — M62552 Muscle wasting and atrophy, not elsewhere classified, left thigh: Secondary | ICD-10-CM | POA: Diagnosis not present

## 2022-12-05 DIAGNOSIS — M62551 Muscle wasting and atrophy, not elsewhere classified, right thigh: Secondary | ICD-10-CM | POA: Diagnosis not present

## 2022-12-05 DIAGNOSIS — M62562 Muscle wasting and atrophy, not elsewhere classified, left lower leg: Secondary | ICD-10-CM | POA: Diagnosis not present

## 2022-12-08 DIAGNOSIS — M62551 Muscle wasting and atrophy, not elsewhere classified, right thigh: Secondary | ICD-10-CM | POA: Diagnosis not present

## 2022-12-08 DIAGNOSIS — M62552 Muscle wasting and atrophy, not elsewhere classified, left thigh: Secondary | ICD-10-CM | POA: Diagnosis not present

## 2022-12-08 DIAGNOSIS — R488 Other symbolic dysfunctions: Secondary | ICD-10-CM | POA: Diagnosis not present

## 2022-12-08 DIAGNOSIS — M62562 Muscle wasting and atrophy, not elsewhere classified, left lower leg: Secondary | ICD-10-CM | POA: Diagnosis not present

## 2022-12-08 DIAGNOSIS — R2681 Unsteadiness on feet: Secondary | ICD-10-CM | POA: Diagnosis not present

## 2022-12-08 DIAGNOSIS — R41841 Cognitive communication deficit: Secondary | ICD-10-CM | POA: Diagnosis not present

## 2022-12-08 DIAGNOSIS — M62561 Muscle wasting and atrophy, not elsewhere classified, right lower leg: Secondary | ICD-10-CM | POA: Diagnosis not present

## 2022-12-08 DIAGNOSIS — R2689 Other abnormalities of gait and mobility: Secondary | ICD-10-CM | POA: Diagnosis not present

## 2022-12-09 DIAGNOSIS — R2689 Other abnormalities of gait and mobility: Secondary | ICD-10-CM | POA: Diagnosis not present

## 2022-12-09 DIAGNOSIS — R2681 Unsteadiness on feet: Secondary | ICD-10-CM | POA: Diagnosis not present

## 2022-12-09 DIAGNOSIS — M62562 Muscle wasting and atrophy, not elsewhere classified, left lower leg: Secondary | ICD-10-CM | POA: Diagnosis not present

## 2022-12-09 DIAGNOSIS — R41841 Cognitive communication deficit: Secondary | ICD-10-CM | POA: Diagnosis not present

## 2022-12-09 DIAGNOSIS — M62561 Muscle wasting and atrophy, not elsewhere classified, right lower leg: Secondary | ICD-10-CM | POA: Diagnosis not present

## 2022-12-09 DIAGNOSIS — M62552 Muscle wasting and atrophy, not elsewhere classified, left thigh: Secondary | ICD-10-CM | POA: Diagnosis not present

## 2022-12-09 DIAGNOSIS — M62551 Muscle wasting and atrophy, not elsewhere classified, right thigh: Secondary | ICD-10-CM | POA: Diagnosis not present

## 2022-12-09 DIAGNOSIS — R488 Other symbolic dysfunctions: Secondary | ICD-10-CM | POA: Diagnosis not present

## 2022-12-10 DIAGNOSIS — M62552 Muscle wasting and atrophy, not elsewhere classified, left thigh: Secondary | ICD-10-CM | POA: Diagnosis not present

## 2022-12-10 DIAGNOSIS — M62551 Muscle wasting and atrophy, not elsewhere classified, right thigh: Secondary | ICD-10-CM | POA: Diagnosis not present

## 2022-12-10 DIAGNOSIS — M62562 Muscle wasting and atrophy, not elsewhere classified, left lower leg: Secondary | ICD-10-CM | POA: Diagnosis not present

## 2022-12-10 DIAGNOSIS — R2689 Other abnormalities of gait and mobility: Secondary | ICD-10-CM | POA: Diagnosis not present

## 2022-12-10 DIAGNOSIS — R488 Other symbolic dysfunctions: Secondary | ICD-10-CM | POA: Diagnosis not present

## 2022-12-10 DIAGNOSIS — R2681 Unsteadiness on feet: Secondary | ICD-10-CM | POA: Diagnosis not present

## 2022-12-10 DIAGNOSIS — M62561 Muscle wasting and atrophy, not elsewhere classified, right lower leg: Secondary | ICD-10-CM | POA: Diagnosis not present

## 2022-12-11 DIAGNOSIS — R488 Other symbolic dysfunctions: Secondary | ICD-10-CM | POA: Diagnosis not present

## 2022-12-11 DIAGNOSIS — M62551 Muscle wasting and atrophy, not elsewhere classified, right thigh: Secondary | ICD-10-CM | POA: Diagnosis not present

## 2022-12-11 DIAGNOSIS — R2681 Unsteadiness on feet: Secondary | ICD-10-CM | POA: Diagnosis not present

## 2022-12-11 DIAGNOSIS — R2689 Other abnormalities of gait and mobility: Secondary | ICD-10-CM | POA: Diagnosis not present

## 2022-12-11 DIAGNOSIS — M62562 Muscle wasting and atrophy, not elsewhere classified, left lower leg: Secondary | ICD-10-CM | POA: Diagnosis not present

## 2022-12-11 DIAGNOSIS — M62552 Muscle wasting and atrophy, not elsewhere classified, left thigh: Secondary | ICD-10-CM | POA: Diagnosis not present

## 2022-12-11 DIAGNOSIS — M62561 Muscle wasting and atrophy, not elsewhere classified, right lower leg: Secondary | ICD-10-CM | POA: Diagnosis not present

## 2022-12-12 ENCOUNTER — Encounter: Payer: Medicare HMO | Admitting: Psychology

## 2022-12-12 DIAGNOSIS — M62551 Muscle wasting and atrophy, not elsewhere classified, right thigh: Secondary | ICD-10-CM | POA: Diagnosis not present

## 2022-12-12 DIAGNOSIS — R488 Other symbolic dysfunctions: Secondary | ICD-10-CM | POA: Diagnosis not present

## 2022-12-12 DIAGNOSIS — M62561 Muscle wasting and atrophy, not elsewhere classified, right lower leg: Secondary | ICD-10-CM | POA: Diagnosis not present

## 2022-12-12 DIAGNOSIS — R2681 Unsteadiness on feet: Secondary | ICD-10-CM | POA: Diagnosis not present

## 2022-12-12 DIAGNOSIS — M62552 Muscle wasting and atrophy, not elsewhere classified, left thigh: Secondary | ICD-10-CM | POA: Diagnosis not present

## 2022-12-12 DIAGNOSIS — M62562 Muscle wasting and atrophy, not elsewhere classified, left lower leg: Secondary | ICD-10-CM | POA: Diagnosis not present

## 2022-12-12 DIAGNOSIS — R2689 Other abnormalities of gait and mobility: Secondary | ICD-10-CM | POA: Diagnosis not present

## 2022-12-15 ENCOUNTER — Encounter: Payer: Medicare HMO | Admitting: Psychology

## 2022-12-17 ENCOUNTER — Encounter: Payer: Self-pay | Admitting: Psychology

## 2022-12-19 ENCOUNTER — Encounter: Payer: Self-pay | Admitting: Psychology

## 2022-12-19 ENCOUNTER — Encounter: Payer: Medicare HMO | Admitting: Psychology

## 2022-12-19 DIAGNOSIS — Z029 Encounter for administrative examinations, unspecified: Secondary | ICD-10-CM

## 2022-12-22 DIAGNOSIS — M62561 Muscle wasting and atrophy, not elsewhere classified, right lower leg: Secondary | ICD-10-CM | POA: Diagnosis not present

## 2022-12-22 DIAGNOSIS — R488 Other symbolic dysfunctions: Secondary | ICD-10-CM | POA: Diagnosis not present

## 2022-12-22 DIAGNOSIS — M62552 Muscle wasting and atrophy, not elsewhere classified, left thigh: Secondary | ICD-10-CM | POA: Diagnosis not present

## 2022-12-22 DIAGNOSIS — M62551 Muscle wasting and atrophy, not elsewhere classified, right thigh: Secondary | ICD-10-CM | POA: Diagnosis not present

## 2022-12-22 DIAGNOSIS — R2681 Unsteadiness on feet: Secondary | ICD-10-CM | POA: Diagnosis not present

## 2022-12-22 DIAGNOSIS — M62562 Muscle wasting and atrophy, not elsewhere classified, left lower leg: Secondary | ICD-10-CM | POA: Diagnosis not present

## 2022-12-22 DIAGNOSIS — R2689 Other abnormalities of gait and mobility: Secondary | ICD-10-CM | POA: Diagnosis not present

## 2022-12-23 ENCOUNTER — Ambulatory Visit
Admission: RE | Admit: 2022-12-23 | Discharge: 2022-12-23 | Disposition: A | Discharge status: Home / Self Care | Payer: Medicare HMO | Source: Ambulatory Visit | Attending: Physician Assistant | Admitting: Physician Assistant

## 2022-12-23 DIAGNOSIS — M62552 Muscle wasting and atrophy, not elsewhere classified, left thigh: Secondary | ICD-10-CM | POA: Diagnosis not present

## 2022-12-23 DIAGNOSIS — M62551 Muscle wasting and atrophy, not elsewhere classified, right thigh: Secondary | ICD-10-CM | POA: Diagnosis not present

## 2022-12-23 DIAGNOSIS — R2689 Other abnormalities of gait and mobility: Secondary | ICD-10-CM | POA: Diagnosis not present

## 2022-12-23 DIAGNOSIS — M62561 Muscle wasting and atrophy, not elsewhere classified, right lower leg: Secondary | ICD-10-CM | POA: Diagnosis not present

## 2022-12-23 DIAGNOSIS — G3184 Mild cognitive impairment, so stated: Secondary | ICD-10-CM | POA: Diagnosis not present

## 2022-12-23 DIAGNOSIS — M62562 Muscle wasting and atrophy, not elsewhere classified, left lower leg: Secondary | ICD-10-CM | POA: Diagnosis not present

## 2022-12-23 DIAGNOSIS — R488 Other symbolic dysfunctions: Secondary | ICD-10-CM | POA: Diagnosis not present

## 2022-12-23 DIAGNOSIS — R2681 Unsteadiness on feet: Secondary | ICD-10-CM | POA: Diagnosis not present

## 2022-12-23 NOTE — Progress Notes (Signed)
MRI of the brain shows moderate changes in the circulation, moderate atrophy in the brain, mild atrophy in the cerebellum.  No acute findings, no stroke..  Thank you

## 2022-12-24 ENCOUNTER — Telehealth: Payer: Self-pay | Admitting: Family Medicine

## 2022-12-24 DIAGNOSIS — M62561 Muscle wasting and atrophy, not elsewhere classified, right lower leg: Secondary | ICD-10-CM | POA: Diagnosis not present

## 2022-12-24 DIAGNOSIS — M62562 Muscle wasting and atrophy, not elsewhere classified, left lower leg: Secondary | ICD-10-CM | POA: Diagnosis not present

## 2022-12-24 DIAGNOSIS — M62551 Muscle wasting and atrophy, not elsewhere classified, right thigh: Secondary | ICD-10-CM | POA: Diagnosis not present

## 2022-12-24 DIAGNOSIS — R2689 Other abnormalities of gait and mobility: Secondary | ICD-10-CM | POA: Diagnosis not present

## 2022-12-24 DIAGNOSIS — M62552 Muscle wasting and atrophy, not elsewhere classified, left thigh: Secondary | ICD-10-CM | POA: Diagnosis not present

## 2022-12-24 DIAGNOSIS — R488 Other symbolic dysfunctions: Secondary | ICD-10-CM | POA: Diagnosis not present

## 2022-12-24 DIAGNOSIS — R2681 Unsteadiness on feet: Secondary | ICD-10-CM | POA: Diagnosis not present

## 2022-12-24 NOTE — Telephone Encounter (Signed)
Pt would like a call back concerning her conversation with Dr Durene Cal. Please advise.

## 2022-12-25 DIAGNOSIS — R2689 Other abnormalities of gait and mobility: Secondary | ICD-10-CM | POA: Diagnosis not present

## 2022-12-25 DIAGNOSIS — M62551 Muscle wasting and atrophy, not elsewhere classified, right thigh: Secondary | ICD-10-CM | POA: Diagnosis not present

## 2022-12-25 DIAGNOSIS — M62561 Muscle wasting and atrophy, not elsewhere classified, right lower leg: Secondary | ICD-10-CM | POA: Diagnosis not present

## 2022-12-25 DIAGNOSIS — R488 Other symbolic dysfunctions: Secondary | ICD-10-CM | POA: Diagnosis not present

## 2022-12-25 DIAGNOSIS — M62562 Muscle wasting and atrophy, not elsewhere classified, left lower leg: Secondary | ICD-10-CM | POA: Diagnosis not present

## 2022-12-25 DIAGNOSIS — R2681 Unsteadiness on feet: Secondary | ICD-10-CM | POA: Diagnosis not present

## 2022-12-25 DIAGNOSIS — M62552 Muscle wasting and atrophy, not elsewhere classified, left thigh: Secondary | ICD-10-CM | POA: Diagnosis not present

## 2022-12-25 NOTE — Telephone Encounter (Signed)
Called pt and phone continued to ring, no vm came on.

## 2022-12-29 ENCOUNTER — Ambulatory Visit: Payer: Medicare HMO | Admitting: Family Medicine

## 2022-12-29 DIAGNOSIS — R488 Other symbolic dysfunctions: Secondary | ICD-10-CM | POA: Diagnosis not present

## 2022-12-29 DIAGNOSIS — M62561 Muscle wasting and atrophy, not elsewhere classified, right lower leg: Secondary | ICD-10-CM | POA: Diagnosis not present

## 2022-12-29 DIAGNOSIS — R2681 Unsteadiness on feet: Secondary | ICD-10-CM | POA: Diagnosis not present

## 2022-12-29 DIAGNOSIS — M62552 Muscle wasting and atrophy, not elsewhere classified, left thigh: Secondary | ICD-10-CM | POA: Diagnosis not present

## 2022-12-29 DIAGNOSIS — R2689 Other abnormalities of gait and mobility: Secondary | ICD-10-CM | POA: Diagnosis not present

## 2022-12-29 DIAGNOSIS — M62551 Muscle wasting and atrophy, not elsewhere classified, right thigh: Secondary | ICD-10-CM | POA: Diagnosis not present

## 2022-12-29 DIAGNOSIS — M62562 Muscle wasting and atrophy, not elsewhere classified, left lower leg: Secondary | ICD-10-CM | POA: Diagnosis not present

## 2022-12-30 ENCOUNTER — Telehealth: Payer: Self-pay | Admitting: Psychology

## 2022-12-30 NOTE — Telephone Encounter (Signed)
Patient wants a call from Dr.Merz, did not want to elaborate, just wants a call from [facility]

## 2022-12-31 DIAGNOSIS — R2689 Other abnormalities of gait and mobility: Secondary | ICD-10-CM | POA: Diagnosis not present

## 2022-12-31 DIAGNOSIS — M62561 Muscle wasting and atrophy, not elsewhere classified, right lower leg: Secondary | ICD-10-CM | POA: Diagnosis not present

## 2022-12-31 DIAGNOSIS — M62552 Muscle wasting and atrophy, not elsewhere classified, left thigh: Secondary | ICD-10-CM | POA: Diagnosis not present

## 2022-12-31 DIAGNOSIS — R2681 Unsteadiness on feet: Secondary | ICD-10-CM | POA: Diagnosis not present

## 2022-12-31 DIAGNOSIS — M62562 Muscle wasting and atrophy, not elsewhere classified, left lower leg: Secondary | ICD-10-CM | POA: Diagnosis not present

## 2022-12-31 DIAGNOSIS — M62551 Muscle wasting and atrophy, not elsewhere classified, right thigh: Secondary | ICD-10-CM | POA: Diagnosis not present

## 2022-12-31 DIAGNOSIS — R488 Other symbolic dysfunctions: Secondary | ICD-10-CM | POA: Diagnosis not present

## 2022-12-31 NOTE — Telephone Encounter (Signed)
Called patient and offered to help schedule her but she wants her son to come with her. She will call back. Patient said she received your report to day and felt it was very accurate. Patient didn't really remember exactly why she called. I told patient what ever she needed to call us back and we could help her.

## 2023-01-01 DIAGNOSIS — R2681 Unsteadiness on feet: Secondary | ICD-10-CM | POA: Diagnosis not present

## 2023-01-01 DIAGNOSIS — M62562 Muscle wasting and atrophy, not elsewhere classified, left lower leg: Secondary | ICD-10-CM | POA: Diagnosis not present

## 2023-01-01 DIAGNOSIS — M62561 Muscle wasting and atrophy, not elsewhere classified, right lower leg: Secondary | ICD-10-CM | POA: Diagnosis not present

## 2023-01-01 DIAGNOSIS — R488 Other symbolic dysfunctions: Secondary | ICD-10-CM | POA: Diagnosis not present

## 2023-01-01 DIAGNOSIS — M62551 Muscle wasting and atrophy, not elsewhere classified, right thigh: Secondary | ICD-10-CM | POA: Diagnosis not present

## 2023-01-01 DIAGNOSIS — M62552 Muscle wasting and atrophy, not elsewhere classified, left thigh: Secondary | ICD-10-CM | POA: Diagnosis not present

## 2023-01-01 DIAGNOSIS — R2689 Other abnormalities of gait and mobility: Secondary | ICD-10-CM | POA: Diagnosis not present

## 2023-01-02 DIAGNOSIS — M62562 Muscle wasting and atrophy, not elsewhere classified, left lower leg: Secondary | ICD-10-CM | POA: Diagnosis not present

## 2023-01-02 DIAGNOSIS — R2689 Other abnormalities of gait and mobility: Secondary | ICD-10-CM | POA: Diagnosis not present

## 2023-01-02 DIAGNOSIS — M62551 Muscle wasting and atrophy, not elsewhere classified, right thigh: Secondary | ICD-10-CM | POA: Diagnosis not present

## 2023-01-02 DIAGNOSIS — R2681 Unsteadiness on feet: Secondary | ICD-10-CM | POA: Diagnosis not present

## 2023-01-02 DIAGNOSIS — M62552 Muscle wasting and atrophy, not elsewhere classified, left thigh: Secondary | ICD-10-CM | POA: Diagnosis not present

## 2023-01-02 DIAGNOSIS — M62561 Muscle wasting and atrophy, not elsewhere classified, right lower leg: Secondary | ICD-10-CM | POA: Diagnosis not present

## 2023-01-02 DIAGNOSIS — R488 Other symbolic dysfunctions: Secondary | ICD-10-CM | POA: Diagnosis not present

## 2023-01-04 DIAGNOSIS — M62561 Muscle wasting and atrophy, not elsewhere classified, right lower leg: Secondary | ICD-10-CM | POA: Diagnosis not present

## 2023-01-04 DIAGNOSIS — M62551 Muscle wasting and atrophy, not elsewhere classified, right thigh: Secondary | ICD-10-CM | POA: Diagnosis not present

## 2023-01-04 DIAGNOSIS — M62562 Muscle wasting and atrophy, not elsewhere classified, left lower leg: Secondary | ICD-10-CM | POA: Diagnosis not present

## 2023-01-04 DIAGNOSIS — R2681 Unsteadiness on feet: Secondary | ICD-10-CM | POA: Diagnosis not present

## 2023-01-04 DIAGNOSIS — R488 Other symbolic dysfunctions: Secondary | ICD-10-CM | POA: Diagnosis not present

## 2023-01-04 DIAGNOSIS — M62552 Muscle wasting and atrophy, not elsewhere classified, left thigh: Secondary | ICD-10-CM | POA: Diagnosis not present

## 2023-01-04 DIAGNOSIS — R2689 Other abnormalities of gait and mobility: Secondary | ICD-10-CM | POA: Diagnosis not present

## 2023-01-06 DIAGNOSIS — R2689 Other abnormalities of gait and mobility: Secondary | ICD-10-CM | POA: Diagnosis not present

## 2023-01-06 DIAGNOSIS — M62561 Muscle wasting and atrophy, not elsewhere classified, right lower leg: Secondary | ICD-10-CM | POA: Diagnosis not present

## 2023-01-06 DIAGNOSIS — M62551 Muscle wasting and atrophy, not elsewhere classified, right thigh: Secondary | ICD-10-CM | POA: Diagnosis not present

## 2023-01-06 DIAGNOSIS — M62552 Muscle wasting and atrophy, not elsewhere classified, left thigh: Secondary | ICD-10-CM | POA: Diagnosis not present

## 2023-01-06 DIAGNOSIS — R2681 Unsteadiness on feet: Secondary | ICD-10-CM | POA: Diagnosis not present

## 2023-01-06 DIAGNOSIS — R488 Other symbolic dysfunctions: Secondary | ICD-10-CM | POA: Diagnosis not present

## 2023-01-06 DIAGNOSIS — M62562 Muscle wasting and atrophy, not elsewhere classified, left lower leg: Secondary | ICD-10-CM | POA: Diagnosis not present

## 2023-01-07 DIAGNOSIS — M62562 Muscle wasting and atrophy, not elsewhere classified, left lower leg: Secondary | ICD-10-CM | POA: Diagnosis not present

## 2023-01-07 DIAGNOSIS — R2689 Other abnormalities of gait and mobility: Secondary | ICD-10-CM | POA: Diagnosis not present

## 2023-01-07 DIAGNOSIS — R488 Other symbolic dysfunctions: Secondary | ICD-10-CM | POA: Diagnosis not present

## 2023-01-07 DIAGNOSIS — M62561 Muscle wasting and atrophy, not elsewhere classified, right lower leg: Secondary | ICD-10-CM | POA: Diagnosis not present

## 2023-01-07 DIAGNOSIS — R2681 Unsteadiness on feet: Secondary | ICD-10-CM | POA: Diagnosis not present

## 2023-01-07 DIAGNOSIS — M62551 Muscle wasting and atrophy, not elsewhere classified, right thigh: Secondary | ICD-10-CM | POA: Diagnosis not present

## 2023-01-07 DIAGNOSIS — M62552 Muscle wasting and atrophy, not elsewhere classified, left thigh: Secondary | ICD-10-CM | POA: Diagnosis not present

## 2023-01-08 DIAGNOSIS — M62561 Muscle wasting and atrophy, not elsewhere classified, right lower leg: Secondary | ICD-10-CM | POA: Diagnosis not present

## 2023-01-08 DIAGNOSIS — R488 Other symbolic dysfunctions: Secondary | ICD-10-CM | POA: Diagnosis not present

## 2023-01-08 DIAGNOSIS — M62562 Muscle wasting and atrophy, not elsewhere classified, left lower leg: Secondary | ICD-10-CM | POA: Diagnosis not present

## 2023-01-08 DIAGNOSIS — M62552 Muscle wasting and atrophy, not elsewhere classified, left thigh: Secondary | ICD-10-CM | POA: Diagnosis not present

## 2023-01-08 DIAGNOSIS — M62551 Muscle wasting and atrophy, not elsewhere classified, right thigh: Secondary | ICD-10-CM | POA: Diagnosis not present

## 2023-01-08 DIAGNOSIS — R2689 Other abnormalities of gait and mobility: Secondary | ICD-10-CM | POA: Diagnosis not present

## 2023-01-08 DIAGNOSIS — R2681 Unsteadiness on feet: Secondary | ICD-10-CM | POA: Diagnosis not present

## 2023-01-09 DIAGNOSIS — M62552 Muscle wasting and atrophy, not elsewhere classified, left thigh: Secondary | ICD-10-CM | POA: Diagnosis not present

## 2023-01-09 DIAGNOSIS — M62562 Muscle wasting and atrophy, not elsewhere classified, left lower leg: Secondary | ICD-10-CM | POA: Diagnosis not present

## 2023-01-09 DIAGNOSIS — R2689 Other abnormalities of gait and mobility: Secondary | ICD-10-CM | POA: Diagnosis not present

## 2023-01-09 DIAGNOSIS — M62551 Muscle wasting and atrophy, not elsewhere classified, right thigh: Secondary | ICD-10-CM | POA: Diagnosis not present

## 2023-01-09 DIAGNOSIS — R2681 Unsteadiness on feet: Secondary | ICD-10-CM | POA: Diagnosis not present

## 2023-01-09 DIAGNOSIS — M62561 Muscle wasting and atrophy, not elsewhere classified, right lower leg: Secondary | ICD-10-CM | POA: Diagnosis not present

## 2023-01-09 DIAGNOSIS — R488 Other symbolic dysfunctions: Secondary | ICD-10-CM | POA: Diagnosis not present

## 2023-01-11 DIAGNOSIS — M62551 Muscle wasting and atrophy, not elsewhere classified, right thigh: Secondary | ICD-10-CM | POA: Diagnosis not present

## 2023-01-11 DIAGNOSIS — M62552 Muscle wasting and atrophy, not elsewhere classified, left thigh: Secondary | ICD-10-CM | POA: Diagnosis not present

## 2023-01-11 DIAGNOSIS — R2681 Unsteadiness on feet: Secondary | ICD-10-CM | POA: Diagnosis not present

## 2023-01-11 DIAGNOSIS — R488 Other symbolic dysfunctions: Secondary | ICD-10-CM | POA: Diagnosis not present

## 2023-01-11 DIAGNOSIS — M62562 Muscle wasting and atrophy, not elsewhere classified, left lower leg: Secondary | ICD-10-CM | POA: Diagnosis not present

## 2023-01-11 DIAGNOSIS — M62561 Muscle wasting and atrophy, not elsewhere classified, right lower leg: Secondary | ICD-10-CM | POA: Diagnosis not present

## 2023-01-11 DIAGNOSIS — R2689 Other abnormalities of gait and mobility: Secondary | ICD-10-CM | POA: Diagnosis not present

## 2023-01-12 DIAGNOSIS — M62562 Muscle wasting and atrophy, not elsewhere classified, left lower leg: Secondary | ICD-10-CM | POA: Diagnosis not present

## 2023-01-12 DIAGNOSIS — R488 Other symbolic dysfunctions: Secondary | ICD-10-CM | POA: Diagnosis not present

## 2023-01-12 DIAGNOSIS — M62551 Muscle wasting and atrophy, not elsewhere classified, right thigh: Secondary | ICD-10-CM | POA: Diagnosis not present

## 2023-01-12 DIAGNOSIS — M62561 Muscle wasting and atrophy, not elsewhere classified, right lower leg: Secondary | ICD-10-CM | POA: Diagnosis not present

## 2023-01-12 DIAGNOSIS — R2689 Other abnormalities of gait and mobility: Secondary | ICD-10-CM | POA: Diagnosis not present

## 2023-01-12 DIAGNOSIS — R2681 Unsteadiness on feet: Secondary | ICD-10-CM | POA: Diagnosis not present

## 2023-01-12 DIAGNOSIS — M62552 Muscle wasting and atrophy, not elsewhere classified, left thigh: Secondary | ICD-10-CM | POA: Diagnosis not present

## 2023-01-13 DIAGNOSIS — R2681 Unsteadiness on feet: Secondary | ICD-10-CM | POA: Diagnosis not present

## 2023-01-13 DIAGNOSIS — R2689 Other abnormalities of gait and mobility: Secondary | ICD-10-CM | POA: Diagnosis not present

## 2023-01-13 DIAGNOSIS — M62562 Muscle wasting and atrophy, not elsewhere classified, left lower leg: Secondary | ICD-10-CM | POA: Diagnosis not present

## 2023-01-13 DIAGNOSIS — R488 Other symbolic dysfunctions: Secondary | ICD-10-CM | POA: Diagnosis not present

## 2023-01-13 DIAGNOSIS — M62551 Muscle wasting and atrophy, not elsewhere classified, right thigh: Secondary | ICD-10-CM | POA: Diagnosis not present

## 2023-01-13 DIAGNOSIS — M62552 Muscle wasting and atrophy, not elsewhere classified, left thigh: Secondary | ICD-10-CM | POA: Diagnosis not present

## 2023-01-13 DIAGNOSIS — M62561 Muscle wasting and atrophy, not elsewhere classified, right lower leg: Secondary | ICD-10-CM | POA: Diagnosis not present

## 2023-01-14 DIAGNOSIS — M62551 Muscle wasting and atrophy, not elsewhere classified, right thigh: Secondary | ICD-10-CM | POA: Diagnosis not present

## 2023-01-14 DIAGNOSIS — M62561 Muscle wasting and atrophy, not elsewhere classified, right lower leg: Secondary | ICD-10-CM | POA: Diagnosis not present

## 2023-01-14 DIAGNOSIS — R2689 Other abnormalities of gait and mobility: Secondary | ICD-10-CM | POA: Diagnosis not present

## 2023-01-14 DIAGNOSIS — R2681 Unsteadiness on feet: Secondary | ICD-10-CM | POA: Diagnosis not present

## 2023-01-14 DIAGNOSIS — R488 Other symbolic dysfunctions: Secondary | ICD-10-CM | POA: Diagnosis not present

## 2023-01-14 DIAGNOSIS — M62562 Muscle wasting and atrophy, not elsewhere classified, left lower leg: Secondary | ICD-10-CM | POA: Diagnosis not present

## 2023-01-14 DIAGNOSIS — M62552 Muscle wasting and atrophy, not elsewhere classified, left thigh: Secondary | ICD-10-CM | POA: Diagnosis not present

## 2023-01-15 ENCOUNTER — Ambulatory Visit: Payer: Medicare HMO | Admitting: Psychology

## 2023-01-15 DIAGNOSIS — G3184 Mild cognitive impairment, so stated: Secondary | ICD-10-CM | POA: Diagnosis not present

## 2023-01-15 DIAGNOSIS — R488 Other symbolic dysfunctions: Secondary | ICD-10-CM | POA: Diagnosis not present

## 2023-01-15 DIAGNOSIS — M62551 Muscle wasting and atrophy, not elsewhere classified, right thigh: Secondary | ICD-10-CM | POA: Diagnosis not present

## 2023-01-15 DIAGNOSIS — M62552 Muscle wasting and atrophy, not elsewhere classified, left thigh: Secondary | ICD-10-CM | POA: Diagnosis not present

## 2023-01-15 DIAGNOSIS — M62562 Muscle wasting and atrophy, not elsewhere classified, left lower leg: Secondary | ICD-10-CM | POA: Diagnosis not present

## 2023-01-15 DIAGNOSIS — R2689 Other abnormalities of gait and mobility: Secondary | ICD-10-CM | POA: Diagnosis not present

## 2023-01-15 DIAGNOSIS — M62561 Muscle wasting and atrophy, not elsewhere classified, right lower leg: Secondary | ICD-10-CM | POA: Diagnosis not present

## 2023-01-15 DIAGNOSIS — R2681 Unsteadiness on feet: Secondary | ICD-10-CM | POA: Diagnosis not present

## 2023-01-15 NOTE — Progress Notes (Signed)
   Neuropsychology Feedback Session [name]. [facility] [place] Department of Neurology  Reason for Referral:   [name] is a 87 y.o. right-handed Caucasian female referred by Marlowe Kays, PA-C, to characterize her current cognitive functioning and assist with diagnostic clarity and treatment planning in the context of subjective cognitive decline.   Feedback:   [name] completed a comprehensive neuropsychological evaluation on 12/04/2022. Please refer to that encounter for the full report and recommendations. Briefly, results suggested a primary weakness surrounding delayed retrieval aspects of both verbal and visual memory (i.e., spontaneous retention rates ranged from 14% to 43%). Performance variability was further exhibited across encoding (i.e., learning) aspects of verbal memory. Performances were otherwise appropriate relative to age-matched peers. The etiology for ongoing memory dysfunction is unclear at the present time. Her son reported concerns surrounding rapid forgetting in typical day-to-day life. Given reduced retention rates across memory testing, there is also arguably some objective evidence to warrant concerns for this presentation as well. Rapid forgetting can be one of the earliest signs of Alzheimer's disease. There are also other very subtle potential warning signs for this illness, namely in relative weaknesses seen across semantic fluency and some very slight variability across confrontation naming. However, it is important to highlight that these relative weaknesses do not constitute normative impairment as they scored no lower than the below average range and could represent longstanding and unchanged patterns of strength and weakness. [name] also performed adequately across memory recognition trials and there is not objective evidence for a storage impairment at the present time. Overall, I feel that some concern for the very early stages of  Alzheimer's disease is reasonable. However, the presence of this illness cannot be definitively stated with confidence and active monitoring over time will be important for greater diagnostic clarity.   [name] was accompanied by her son during the current feedback session. Content of the current session focused on the results of her neuropsychological evaluation. [name] was given the opportunity to ask questions and her questions were answered. She was encouraged to reach out should additional questions arise. A copy of her report was provided at the conclusion of the visit.      One unit [phone] was billed for Dr. Tammy Sours time spent preparing for, conducting, and documenting the current feedback session with [name].

## 2023-01-19 ENCOUNTER — Other Ambulatory Visit: Payer: Self-pay | Admitting: Family

## 2023-01-19 DIAGNOSIS — R2681 Unsteadiness on feet: Secondary | ICD-10-CM | POA: Diagnosis not present

## 2023-01-19 DIAGNOSIS — M62562 Muscle wasting and atrophy, not elsewhere classified, left lower leg: Secondary | ICD-10-CM | POA: Diagnosis not present

## 2023-01-19 DIAGNOSIS — M62551 Muscle wasting and atrophy, not elsewhere classified, right thigh: Secondary | ICD-10-CM | POA: Diagnosis not present

## 2023-01-19 DIAGNOSIS — R2689 Other abnormalities of gait and mobility: Secondary | ICD-10-CM | POA: Diagnosis not present

## 2023-01-19 DIAGNOSIS — N390 Urinary tract infection, site not specified: Secondary | ICD-10-CM

## 2023-01-19 DIAGNOSIS — M62561 Muscle wasting and atrophy, not elsewhere classified, right lower leg: Secondary | ICD-10-CM | POA: Diagnosis not present

## 2023-01-19 DIAGNOSIS — R488 Other symbolic dysfunctions: Secondary | ICD-10-CM | POA: Diagnosis not present

## 2023-01-19 DIAGNOSIS — M62552 Muscle wasting and atrophy, not elsewhere classified, left thigh: Secondary | ICD-10-CM | POA: Diagnosis not present

## 2023-01-20 ENCOUNTER — Telehealth: Payer: Self-pay | Admitting: Pharmacist

## 2023-01-20 DIAGNOSIS — R2681 Unsteadiness on feet: Secondary | ICD-10-CM | POA: Diagnosis not present

## 2023-01-20 DIAGNOSIS — R488 Other symbolic dysfunctions: Secondary | ICD-10-CM | POA: Diagnosis not present

## 2023-01-20 DIAGNOSIS — M62561 Muscle wasting and atrophy, not elsewhere classified, right lower leg: Secondary | ICD-10-CM | POA: Diagnosis not present

## 2023-01-20 DIAGNOSIS — M62551 Muscle wasting and atrophy, not elsewhere classified, right thigh: Secondary | ICD-10-CM | POA: Diagnosis not present

## 2023-01-20 DIAGNOSIS — M62552 Muscle wasting and atrophy, not elsewhere classified, left thigh: Secondary | ICD-10-CM | POA: Diagnosis not present

## 2023-01-20 DIAGNOSIS — M62562 Muscle wasting and atrophy, not elsewhere classified, left lower leg: Secondary | ICD-10-CM | POA: Diagnosis not present

## 2023-01-20 DIAGNOSIS — R2689 Other abnormalities of gait and mobility: Secondary | ICD-10-CM | POA: Diagnosis not present

## 2023-01-20 NOTE — Progress Notes (Signed)
Care Management & Coordination Services Pharmacy Team  Reason for Encounter: Hypertension  Contacted patient to discuss hypertension disease state. Spoke with patient on 01/20/2023     Current antihypertensive regimen:  Nadolol 20 mg  Patient verbally confirms she is taking the above medications as directed. Yes  How often are you checking your Blood Pressure?  Patient states she has not been checking her blood pressure recently.  Patient denies having any dizziness or recent falls.  What recent interventions/DTPs have been made by any provider to improve Blood Pressure control since last CPP Visit: No recent interventions or DTPs.  Any recent hospitalizations or ED visits since last visit with CPP? No  What diet changes have been made to improve Blood Pressure Control?  Patient states she lives in a senior living facility.  What exercise is being done to improve your Blood Pressure Control?  Patient tries to be as active as she can.  Adherence Review: Is the patient currently on ACE/ARB medication? No Does the patient have >5 day gap between last estimated fill dates? No  Star Rating Drugs:  Ezetimibe 10 mg-Simvastatin 20 mg last filled 01/01/2023 90 DS   Chart Updates: Recent office visits:  11/27/2022 [facility] (Fam Med) Dulce Sellar, NP; nitrofurantoin 50 mg for recurrent UTI  11/05/2022 [facility] (Fam Med) Dulce Sellar, NP; nitrofurantoin 100 mg for cystitis  Recent consult visits:  01/15/2023 [facility] (Neurology) Rosann Auerbach, PhD; no medication changes indicated.  12/04/2022 [facility] (Neurology) Rosann Auerbach, PhD; no medication changes indicated.  Hospital visits:  None in previous 6 months  Medications: Outpatient Encounter Medications as of 01/20/2023  Medication Sig   brimonidine-timolol (COMBIGAN) 0.2-0.5 % ophthalmic solution Place 1 drop into both eyes every 12 (twelve) hours.   ezetimibe-simvastatin (VYTORIN) 10-20 MG tablet TAKE 1 TABLET BY MOUTH EVERY DAY   Multiple  Vitamins-Minerals (MULTIVITAMIN ADULT) CHEW Chew 2 each by mouth daily. Nature Made for Her   nadolol (CORGARD) 20 MG tablet Take 1 tablet (20 mg total) by mouth at bedtime.   nitrofurantoin (MACRODANTIN) 50 MG capsule TAKE ONE CAPSULE ONCE DAILY   omeprazole (PRILOSEC) 20 MG capsule Take 20 mg by mouth daily. In the morning for acid reflux over the counter   traZODone (DESYREL) 100 MG tablet TAKE 1/4 TO 1/2 TABLET BY MOUTH AT BEDTIME IF NEEDED FOR SLEEP.   venlafaxine XR (EFFEXOR XR) 75 MG 24 hr capsule Take 1 capsule (75 mg total) by mouth daily before supper.   venlafaxine XR (EFFEXOR-XR) 150 MG 24 hr capsule Take 1 capsule (150 mg total) by mouth daily with breakfast.   No facility-administered encounter medications on file as of 01/20/2023.    Recent Office Vitals: BP Readings from Last 3 Encounters:  11/27/22 (!) 141/76  11/05/22 137/76  10/07/22 114/81   Pulse Readings from Last 3 Encounters:  11/27/22 69  11/05/22 64  10/07/22 (!) 111    Wt Readings from Last 3 Encounters:  11/27/22 144 lb (65.3 kg)  11/05/22 144 lb 2 oz (65.4 kg)  10/07/22 149 lb (67.6 kg)     Kidney Function Lab Results  Component Value Date/Time   CREATININE 0.73 06/23/2022 03:25 PM   CREATININE 0.95 03/10/2022 11:24 AM   CREATININE 0.78 05/11/2020 02:36 PM   GFR 75.37 06/23/2022 03:25 PM   GFRNONAA 59 (L) 03/10/2022 11:24 AM   GFRNONAA 71 05/11/2020 02:36 PM   GFRAA 82 05/11/2020 02:36 PM       Latest Ref Rng & Units 06/23/2022  3:25 PM 03/10/2022   11:24 AM 12/17/2021    4:16 PM  BMP  Glucose 70 - 99 mg/dL 76  098  119   BUN 6 - 23 mg/dL 12  13  14    Creatinine 0.40 - 1.20 mg/dL 1.47  8.29  5.62   Sodium 135 - 145 mEq/L 141  140  140   Potassium 3.5 - 5.1 mEq/L 4.2  4.2  3.8   Chloride 96 - 112 mEq/L 104  109  107   CO2 19 - 32 mEq/L 32  24  27   Calcium 8.4 - 10.5 mg/dL 9.2  8.8  9.2      Future Appointments  Date Time Provider Department Center  01/27/2023  1:20 PM Shelva Majestic, MD [facility]  02/03/2023  1:00 PM Marcos Eke, PA-C [facility] None  04/28/2023  3:45 PM Erroll Luna, [facility] None  11/12/2023  9:15 AM [facility] ANNUAL WELLNESS VISIT 1 [facility]   April D Calhoun, [facility] Clinical Pharmacist Assistant [phone]

## 2023-01-21 DIAGNOSIS — R488 Other symbolic dysfunctions: Secondary | ICD-10-CM | POA: Diagnosis not present

## 2023-01-21 DIAGNOSIS — R2681 Unsteadiness on feet: Secondary | ICD-10-CM | POA: Diagnosis not present

## 2023-01-21 DIAGNOSIS — M62562 Muscle wasting and atrophy, not elsewhere classified, left lower leg: Secondary | ICD-10-CM | POA: Diagnosis not present

## 2023-01-21 DIAGNOSIS — M62561 Muscle wasting and atrophy, not elsewhere classified, right lower leg: Secondary | ICD-10-CM | POA: Diagnosis not present

## 2023-01-21 DIAGNOSIS — M62552 Muscle wasting and atrophy, not elsewhere classified, left thigh: Secondary | ICD-10-CM | POA: Diagnosis not present

## 2023-01-21 DIAGNOSIS — R2689 Other abnormalities of gait and mobility: Secondary | ICD-10-CM | POA: Diagnosis not present

## 2023-01-21 DIAGNOSIS — M62551 Muscle wasting and atrophy, not elsewhere classified, right thigh: Secondary | ICD-10-CM | POA: Diagnosis not present

## 2023-01-22 ENCOUNTER — Encounter: Payer: Medicare HMO | Admitting: Psychology

## 2023-01-22 DIAGNOSIS — M62561 Muscle wasting and atrophy, not elsewhere classified, right lower leg: Secondary | ICD-10-CM | POA: Diagnosis not present

## 2023-01-22 DIAGNOSIS — M62551 Muscle wasting and atrophy, not elsewhere classified, right thigh: Secondary | ICD-10-CM | POA: Diagnosis not present

## 2023-01-22 DIAGNOSIS — R2689 Other abnormalities of gait and mobility: Secondary | ICD-10-CM | POA: Diagnosis not present

## 2023-01-22 DIAGNOSIS — M62552 Muscle wasting and atrophy, not elsewhere classified, left thigh: Secondary | ICD-10-CM | POA: Diagnosis not present

## 2023-01-22 DIAGNOSIS — R488 Other symbolic dysfunctions: Secondary | ICD-10-CM | POA: Diagnosis not present

## 2023-01-22 DIAGNOSIS — M62562 Muscle wasting and atrophy, not elsewhere classified, left lower leg: Secondary | ICD-10-CM | POA: Diagnosis not present

## 2023-01-22 DIAGNOSIS — R2681 Unsteadiness on feet: Secondary | ICD-10-CM | POA: Diagnosis not present

## 2023-01-23 DIAGNOSIS — R2681 Unsteadiness on feet: Secondary | ICD-10-CM | POA: Diagnosis not present

## 2023-01-23 DIAGNOSIS — M62561 Muscle wasting and atrophy, not elsewhere classified, right lower leg: Secondary | ICD-10-CM | POA: Diagnosis not present

## 2023-01-23 DIAGNOSIS — R2689 Other abnormalities of gait and mobility: Secondary | ICD-10-CM | POA: Diagnosis not present

## 2023-01-23 DIAGNOSIS — M62552 Muscle wasting and atrophy, not elsewhere classified, left thigh: Secondary | ICD-10-CM | POA: Diagnosis not present

## 2023-01-23 DIAGNOSIS — M62562 Muscle wasting and atrophy, not elsewhere classified, left lower leg: Secondary | ICD-10-CM | POA: Diagnosis not present

## 2023-01-23 DIAGNOSIS — R488 Other symbolic dysfunctions: Secondary | ICD-10-CM | POA: Diagnosis not present

## 2023-01-23 DIAGNOSIS — M62551 Muscle wasting and atrophy, not elsewhere classified, right thigh: Secondary | ICD-10-CM | POA: Diagnosis not present

## 2023-01-25 DIAGNOSIS — M62562 Muscle wasting and atrophy, not elsewhere classified, left lower leg: Secondary | ICD-10-CM | POA: Diagnosis not present

## 2023-01-25 DIAGNOSIS — M62552 Muscle wasting and atrophy, not elsewhere classified, left thigh: Secondary | ICD-10-CM | POA: Diagnosis not present

## 2023-01-25 DIAGNOSIS — R2689 Other abnormalities of gait and mobility: Secondary | ICD-10-CM | POA: Diagnosis not present

## 2023-01-25 DIAGNOSIS — R2681 Unsteadiness on feet: Secondary | ICD-10-CM | POA: Diagnosis not present

## 2023-01-25 DIAGNOSIS — M62551 Muscle wasting and atrophy, not elsewhere classified, right thigh: Secondary | ICD-10-CM | POA: Diagnosis not present

## 2023-01-25 DIAGNOSIS — R488 Other symbolic dysfunctions: Secondary | ICD-10-CM | POA: Diagnosis not present

## 2023-01-25 DIAGNOSIS — M62561 Muscle wasting and atrophy, not elsewhere classified, right lower leg: Secondary | ICD-10-CM | POA: Diagnosis not present

## 2023-01-26 DIAGNOSIS — R2681 Unsteadiness on feet: Secondary | ICD-10-CM | POA: Diagnosis not present

## 2023-01-26 DIAGNOSIS — M62551 Muscle wasting and atrophy, not elsewhere classified, right thigh: Secondary | ICD-10-CM | POA: Diagnosis not present

## 2023-01-26 DIAGNOSIS — M62552 Muscle wasting and atrophy, not elsewhere classified, left thigh: Secondary | ICD-10-CM | POA: Diagnosis not present

## 2023-01-26 DIAGNOSIS — M62562 Muscle wasting and atrophy, not elsewhere classified, left lower leg: Secondary | ICD-10-CM | POA: Diagnosis not present

## 2023-01-26 DIAGNOSIS — M62561 Muscle wasting and atrophy, not elsewhere classified, right lower leg: Secondary | ICD-10-CM | POA: Diagnosis not present

## 2023-01-26 DIAGNOSIS — R2689 Other abnormalities of gait and mobility: Secondary | ICD-10-CM | POA: Diagnosis not present

## 2023-01-26 DIAGNOSIS — R488 Other symbolic dysfunctions: Secondary | ICD-10-CM | POA: Diagnosis not present

## 2023-01-27 ENCOUNTER — Ambulatory Visit (INDEPENDENT_AMBULATORY_CARE_PROVIDER_SITE_OTHER): Payer: Medicare HMO | Admitting: Family Medicine

## 2023-01-27 ENCOUNTER — Encounter: Payer: Self-pay | Admitting: Family Medicine

## 2023-01-27 VITALS — BP 128/68 | HR 71 | Temp 97.0°F | Ht 60.0 in | Wt 151.4 lb

## 2023-01-27 DIAGNOSIS — I10 Essential (primary) hypertension: Secondary | ICD-10-CM

## 2023-01-27 DIAGNOSIS — Z0000 Encounter for general adult medical examination without abnormal findings: Secondary | ICD-10-CM | POA: Diagnosis not present

## 2023-01-27 DIAGNOSIS — E785 Hyperlipidemia, unspecified: Secondary | ICD-10-CM

## 2023-01-27 DIAGNOSIS — R300 Dysuria: Secondary | ICD-10-CM | POA: Diagnosis not present

## 2023-01-27 DIAGNOSIS — F3340 Major depressive disorder, recurrent, in remission, unspecified: Secondary | ICD-10-CM | POA: Diagnosis not present

## 2023-01-27 DIAGNOSIS — E538 Deficiency of other specified B group vitamins: Secondary | ICD-10-CM

## 2023-01-27 DIAGNOSIS — I700 Atherosclerosis of aorta: Secondary | ICD-10-CM | POA: Diagnosis not present

## 2023-01-27 LAB — URINALYSIS, ROUTINE W REFLEX MICROSCOPIC
Bilirubin Urine: NEGATIVE
Hgb urine dipstick: NEGATIVE
Ketones, ur: NEGATIVE
Nitrite: NEGATIVE
RBC / HPF: NONE SEEN (ref 0–?)
Specific Gravity, Urine: 1.02 (ref 1.000–1.030)
Total Protein, Urine: NEGATIVE
Urine Glucose: NEGATIVE
Urobilinogen, UA: 0.2 (ref 0.0–1.0)
pH: 6 (ref 5.0–8.0)

## 2023-01-27 LAB — LIPID PANEL
Cholesterol: 154 mg/dL (ref 0–200)
HDL: 45.5 mg/dL (ref >39.00)
LDL Cholesterol: 69 mg/dL (ref 0–99)
NonHDL: 108.14
Total CHOL/HDL Ratio: 3
Triglycerides: 196 mg/dL — ABNORMAL HIGH (ref 0.0–149.0)
VLDL: 39.2 mg/dL (ref 0.0–40.0)

## 2023-01-27 LAB — COMPREHENSIVE METABOLIC PANEL
ALT: 8 U/L (ref 0–35)
AST: 15 U/L (ref 0–37)
Albumin: 4.2 g/dL (ref 3.5–5.2)
Alkaline Phosphatase: 61 U/L (ref 39–117)
BUN: 14 mg/dL (ref 6–23)
CO2: 31 mEq/L (ref 19–32)
Calcium: 9.4 mg/dL (ref 8.4–10.5)
Chloride: 102 mEq/L (ref 96–112)
Creatinine, Ser: 0.75 mg/dL (ref 0.40–1.20)
GFR: 72.66 mL/min (ref >60.00)
Glucose, Bld: 84 mg/dL (ref 70–99)
Potassium: 4.1 mEq/L (ref 3.5–5.1)
Sodium: 141 mEq/L (ref 135–145)
Total Bilirubin: 0.5 mg/dL (ref 0.2–1.2)
Total Protein: 6.9 g/dL (ref 6.0–8.3)

## 2023-01-27 LAB — CBC WITH DIFFERENTIAL/PLATELET
Basophils Absolute: 0 10*3/uL (ref 0.0–0.1)
Basophils Relative: 0.6 % (ref 0.0–3.0)
Eosinophils Absolute: 0 10*3/uL (ref 0.0–0.7)
Eosinophils Relative: 0.8 % (ref 0.0–5.0)
HCT: 42.6 % (ref 36.0–46.0)
Hemoglobin: 13.9 g/dL (ref 12.0–15.0)
Lymphocytes Relative: 25.7 % (ref 12.0–46.0)
Lymphs Abs: 1.5 10*3/uL (ref 0.7–4.0)
MCHC: 32.7 g/dL (ref 30.0–36.0)
MCV: 95.7 fl (ref 78.0–100.0)
Monocytes Absolute: 0.4 10*3/uL (ref 0.1–1.0)
Monocytes Relative: 6.6 % (ref 3.0–12.0)
Neutro Abs: 4 10*3/uL (ref 1.4–7.7)
Neutrophils Relative %: 66.3 % (ref 43.0–77.0)
Platelets: 213 10*3/uL (ref 150.0–400.0)
RBC: 4.45 Mil/uL (ref 3.87–5.11)
RDW: 13.3 % (ref 11.5–15.5)
WBC: 6 10*3/uL (ref 4.0–10.5)

## 2023-01-27 LAB — VITAMIN B12: Vitamin B-12: 406 pg/mL (ref 211–911)

## 2023-01-27 NOTE — Progress Notes (Signed)
Phone: [phone]   Subjective:  Patient presents today for their annual physical. Chief complaint-noted.   See problem oriented charting- ROS- full  review of systems was completed and negative  except for: hallucinations, burning with peeing   The following were reviewed and entered/updated in epic: Past Medical History:  Diagnosis Date   Allergic rhinitis 10/19/2007   No rx      Aortic atherosclerosis (HCC) 01/17/2021   Also with coronary calcium.  CT scan from urology 01/05/2021 ordered by Dr. Earlene Plater of [facility]   Chest pain 01/24/2018   Chronic daily headache 07/08/2017   Colon polyps    Complication of anesthesia    takes longer to wake up- admitted overnight after colonoscopy   Essential hypertension 10/19/2007   Nadolol 40mg       Generalized anxiety disorder    GERD (gastroesophageal reflux disease) 10/19/2007   prilosec 20mg  otc. Recurs if off   Glaucoma    History of COVID-19 05/2021   mild case   History of skin cancer    basal cell on nose   History of UTI (urinary tract infection)    Hypercholesteremia    Hyperlipidemia 03/14/2009   Vytorin      Lichen simplex chronicus    Liver lesion 11/10/2017   Elective MRI advised for follow up liver cyst. Planned mid 2018 after recovers from surgery   Lumbar stenosis with neurogenic claudication    Macular degeneration of right eye 03/03/2013   diagonosed by Dr. Lenny Pastel   Major depressive disorder in remission (HCC) 10/22/2021   Mild cognitive impairment with memory loss 12/04/2022   Osteoarthritis    Osteopenia of neck of right femur 03/29/2019   -1.3 worst t score 03/29/2019; also hip   PONV (postoperative nausea and vomiting)    Post-traumatic headache 10/11/2015   Saw neurology- plan was MRI but never ordered.    Scalp pain 10/22/2015   Stress incontinence 10/19/2007   2 surgeries- no improvement. Urology visits in past. May have one other surgery- holding off for now      Unsteady gait    Visual  hallucinations    Wears glasses    Whooping cough    as a baby   Patient Active Problem List   Diagnosis Date Noted   Mild cognitive impairment with memory loss 12/04/2022    Priority: High   Visual hallucinations     Priority: High   Generalized anxiety disorder 12/03/2022    Priority: Medium    Major depressive disorder in remission (HCC) 10/22/2021    Priority: Medium    Aortic atherosclerosis (HCC) 01/17/2021    Priority: Medium    Liver lesion 11/10/2017    Priority: Medium    Lumbar stenosis with neurogenic claudication 03/20/2016    Priority: Medium    Macular degeneration of right eye 03/03/2013    Priority: Medium    Glaucoma 03/03/2013    Priority: Medium    Hyperlipidemia 03/14/2009    Priority: Medium    Essential hypertension 10/19/2007    Priority: Medium    GERD (gastroesophageal reflux disease) 10/19/2007    Priority: Medium    Chest pain 01/24/2018    Priority: Low   Chronic daily headache 07/08/2017    Priority: Low   Post-traumatic headache 10/11/2015    Priority: Low   Osteoarthritis 10/19/2007    Priority: Low   Stress incontinence 10/19/2007    Priority: Low   Allergic rhinitis 10/19/2007    Priority: Low   Osteopenia of  neck of right femur 03/29/2019   Past Surgical History:  Procedure Laterality Date   ABDOMINAL HYSTERECTOMY  1977   ovaries remain   ANTERIOR LAT LUMBAR FUSION N/A 10/26/2017   Procedure: LUMBAR THREE- LUMBAR FOUR, LUMBAR FOUR- LUMBAR FIVE ANTEROLATERAL LUMBAR INTERBODY ARTHRODESIS;  Surgeon: Shirlean Kelly, MD;  Location: [facility] OR;  Service: Neurosurgery;  Laterality: N/A;  LUMBAR 3- LUMBAR 4, LUMBAR 4- LUMBAR 5 ANTEROLATERAL LUMBAR INTERBODY ARTHRODESIS, LUMBAR 3- LUMBAR 4, LUMBAR 4- LUMBAR 5 PERCUTANEOUS PEDICLE SCREW FIXATION   APPENDECTOMY     APPLICATION OF WOUND VAC Left 04/27/2021   Procedure: APPLICATION OF INCISIONAL WOUND VAC;  Surgeon: Terance Hart, MD;  Location: [facility] OR;  Service: Orthopedics;  Laterality:  Left;   APPLICATION OF WOUND VAC Left 07/01/2021   Procedure: APPLICATION OF WOUND VAC;  Surgeon: Allena Napoleon, MD;  Location: [facility] OR;  Service: Plastics;  Laterality: Left;   BLADDER REPAIR     x two   CHOLECYSTECTOMY     COLONOSCOPY     EYE SURGERY  11/14, 12/14   Macular Dengeneration, Glaucoma   EYE SURGERY  2016   left eye   I & D EXTREMITY Left 04/27/2021   Procedure: IRRIGATION AND DEBRIDEMENT LEFT LEG;  Surgeon: Terance Hart, MD;  Location: [facility] OR;  Service: Orthopedics;  Laterality: Left;   INCISION AND DRAINAGE OF WOUND Right 04/27/2021   Procedure: IRRIGATION AND DEBRIDEMENT WOUND;  Surgeon: Terance Hart, MD;  Location: [facility] OR;  Service: Orthopedics;  Laterality: Right;   LESION REMOVAL Right 06/28/2013   Procedure: EXCISION 3 cm right labial sebaceous cyst;  Surgeon: Annamaria Boots, MD;  Location: [facility] ORS;  Service: Gynecology;  Laterality: Right;   LUMBAR LAMINECTOMY/DECOMPRESSION MICRODISCECTOMY Right 03/20/2016   Procedure: Right - Lumbar four-five lumbar laminotomy, foraminotomy, and possible microdiscectomy;  Surgeon: Shirlean Kelly, MD;  Location: [facility];  Service: Neurosurgery;  Laterality: Right;  right   LUMBAR PERCUTANEOUS PEDICLE SCREW 2 LEVEL  10/26/2017   Procedure: LUMBAR THREE- LUMBAR FOUR, LUMBAR FOUR- LUMBAR FIVE PERCUTANEOUS PEDICLE SCREW FIXATION;  Surgeon: Shirlean Kelly, MD;  Location: [facility] OR;  Service: Neurosurgery;;   MOHS SURGERY     procedure to remove basal cell   SKIN SPLIT GRAFT Left 07/01/2021   Procedure: SKIN GRAFT SPLIT THICKNESS;  Surgeon: Allena Napoleon, MD;  Location: [facility] OR;  Service: Plastics;  Laterality: Left;  1 hour   TONSILLECTOMY AND ADENOIDECTOMY     URETHRAL SLING  2007   URETHRAL SLING  1/12   midurethral    vertebroplasty secondary to traumatic compression fracture      Family History  Problem Relation Age of Onset   Congestive Heart Failure Mother    Thyroid disease Mother    Osteoporosis Mother     Alcoholism Mother    Prostate cancer Father    Hypertension Sister    Thyroid disease Sister    Cancer Sister        brain ?   Breast cancer Maternal Grandmother    Rheum arthritis Daughter     Medications- reviewed and updated Current Outpatient Medications  Medication Sig Dispense Refill   brimonidine-timolol (COMBIGAN) 0.2-0.5 % ophthalmic solution Place 1 drop into both eyes every 12 (twelve) hours.     ezetimibe-simvastatin (VYTORIN) 10-20 MG tablet TAKE 1 TABLET BY MOUTH EVERY DAY 90 tablet 2   Multiple Vitamins-Minerals (MULTIVITAMIN ADULT) CHEW Chew 2 each by mouth daily. Nature Made for Her     nadolol (  CORGARD) 20 MG tablet Take 1 tablet (20 mg total) by mouth at bedtime. 90 tablet 3   omeprazole (PRILOSEC) 20 MG capsule Take 20 mg by mouth daily. In the morning for acid reflux over the counter     traZODone (DESYREL) 100 MG tablet TAKE 1/4 TO 1/2 TABLET BY MOUTH AT BEDTIME IF NEEDED FOR SLEEP. 45 tablet 3   venlafaxine XR (EFFEXOR-XR) 150 MG 24 hr capsule Take 1 capsule (150 mg total) by mouth daily with breakfast. 90 capsule 3   No current facility-administered medications for this visit.    Allergies-reviewed and updated Allergies  Allergen Reactions   Citrate Of Magnesia Other (See Comments)    confusion   Codeine Nausea And Vomiting   Shingrix [Zoster Vac Recomb Adjuvanted] Swelling and Rash    Rash and temp x several days. (low grade 100 -101 )     Social History   Social History Narrative   HSG, attended [employer]   Married '65   1 son '67, 1 Daughter '69; 5 grandchildren. 1 grandson dealing with drugs after divorce of parents. Oldest.    Work: retired Runner, broadcasting/film/video   Marriage in good health   Former smoker   Right-handed   Caffeine: 11/2 cups per day   Objective  Objective:  BP 128/68   Pulse 71   Temp (!) 97 F (36.1 C)   Ht 5' (1.524 m)   Wt 151 lb 6.4 oz (68.7 kg)   LMP 08/11/1974   SpO2 98%   BMI 29.57 kg/m  Gen: NAD, resting  comfortably HEENT: Mucous membranes are moist. Oropharynx normal Neck: no thyromegaly CV: RRR no murmurs rubs or gallops Lungs: CTAB no crackles, wheeze, rhonchi Abdomen: soft/nontender/nondistended/normal bowel sounds. No rebound or guarding.  Ext: trace edema Skin: warm, dry Neuro: grossly normal, moves all extremities, PERRLA, walks with walker   Assessment and Plan  87 y.o. female presenting for annual physical.  Health Maintenance counseling: 1. Anticipatory guidance: Patient counseled regarding regular dental exams -q6 months advised, eye exams -yearly,  avoiding smoking and second hand smoke , limiting alcohol to 2 beverages per day , no illicit drugs .   2. Risk factor reduction:  Advised patient of need for regular exercise and diet rich and fruits and vegetables to reduce risk of heart attack and stroke.  Exercise- legacy helps her- working on walking more stably.  Diet/weight management-within 4 lbs of last march- up slightly- reports good food where she lives at [employer]  Wt Readings from Last 3 Encounters:  01/27/23 151 lb 6.4 oz (68.7 kg)  11/27/22 144 lb (65.3 kg)  11/05/22 144 lb 2 oz (65.4 kg)  3. Immunizations/screenings/ancillary studies- consider COVID shot in fall, thinks had 2nd shingrix- could check with pharmacy Immunization History  Administered Date(s) Administered   Covid-19, Mrna,Vaccine(Spikevax)83yrs and older 06/11/2022   Fluad Quad(high Dose 65+) 05/26/2019, 06/18/2020, 05/15/2022   Influenza Whole 07/18/2004, 04/25/2010, 04/28/2010   Influenza, High Dose Seasonal PF 08/26/2013, 04/19/2014, 04/04/2016, 05/21/2021   Influenza-Unspecified 05/11/2012, 04/03/2015, 04/13/2017   PFIZER Comirnaty(Gray Top)Covid-19 Tri-Sucrose Vaccine 01/10/2021   PFIZER(Purple Top)SARS-COV-2 Vaccination 09/09/2019, 09/30/2019, 05/24/2020   Pfizer Covid-19 Vaccine Bivalent Booster 54yrs & up 05/21/2021   Pneumococcal Conjugate-13 10/08/2015   Pneumococcal  Polysaccharide-23 08/11/2006   Td 03/09/2008   Tdap 04/03/2015, 12/17/2021   Zoster Recombinat (Shingrix) 12/17/2016   Zoster, Live 08/11/2009  4. cervical cancer screening- past age based screening recommendations    5. Colon cancer screening - past age based screening  recommendations  6. Breast cancer screening- past age based screening recommendations  7. Does not see dermatologist. Denies spots on skin she is concerned about 7. Smoking associated screening (lung cancer screening, AAA screen 65-75, UA)- former smoker- quit smoking 1967 8. STD screening - not dating and not planning to  Status of chronic or acute concerns   # Painful urination S: Patient reports pain when urinating that at times can bring her to tears.  She reports medicine she was given on -has not been beneficial- see below -Most recently with 3-day course 11/27/2022 and now on daily prophylactic-has seen urology in the past for recurrent UTI A/P: ongoing issue- check UA and culture- may need to see urology again- would recommend urology visit if no findings on labs   # Early Alzheimer's per Dr. Milbert Coulter on 01/15/2023 vs [facility] S: Patient reports she has had hallucinations for a very long period of time-they are about the same-see things that others do not see at [facility] [place]   -mainly at night- keeping blinds closed helps A/P: with memory changes glad she sees Marlowe Kays, [place] next week- would ask for their opinion on hallucinations in [facility] or dementia -seems to have paranoia about someone watching her- started arodund when husband became ill  #hypertension S: medication: Nadolol 20 mg BP Readings from Last 3 Encounters:  01/27/23 128/68  11/27/22 (!) 141/76  11/05/22 137/76  A/P: reasonable control- continue current medications     #hyperlipidemia #aortic atherosclerosis S: Medication:Vytorin 10-20 mg Lab Results  Component Value Date   CHOL 151 10/22/2021   HDL 51.70 10/22/2021   LDLCALC 68 10/22/2021    LDLDIRECT 84.0 03/06/2017   TRIG 161.0 (H) 10/22/2021   CHOLHDL 3 10/22/2021   A/P: LDL at goal last year- update today with full lipid panel    # Depression S: Medication:Venlafaxine 150 mg twice daily in the past now on 150 in the morning and 75 mg in the evening, trazodone 100 mg -denies anhedonia or depressed mood A/P: depression  is doing well- wants to try to cut out evening dose of venlafaxine 75 mg- she will continue the 150 mg XR in the morning  # B12 deficiency S: Current treatment/medication (oral vs. IM): 1000 mcg daily  Lab Results  Component Value Date   VITAMINB12 271 10/07/2022  A/P: hopefully stable- update b12 today. Continue current meds for now    Recommended follow up: Return in about 6 months (around 07/29/2023) for followup or sooner if needed.Schedule b4 you leave. Future Appointments  Date Time Provider Department Center  02/03/2023  1:00 PM Elwyn Reach [facility] None  04/28/2023  3:45 PM Erroll Luna, RPH [facility] None  11/12/2023  9:15 AM [facility] ANNUAL WELLNESS VISIT 1 [facility]   Lab/Order associations: fasting   ICD-10-CM   1. Preventative health care  Z00.00     2. Essential hypertension  I10     3. Hyperlipidemia, unspecified hyperlipidemia type  E78.5 Comprehensive metabolic panel    CBC with Differential/Platelet    Lipid panel    Urinalysis, Routine w reflex microscopic    4. Aortic atherosclerosis (HCC)  I70.0     5. Recurrent major depressive disorder, in remission (HCC)  F33.40     6. B12 deficiency  E53.8 Vitamin B12    7. Dysuria  R30.0 Urine Culture    Urinalysis, Routine w reflex microscopic      No orders of the defined types were placed in this encounter.  Return precautions advised.  Garret Reddish, MD

## 2023-01-27 NOTE — Patient Instructions (Addendum)
Let us know if you get your 2nd [facility] or any other COVID vaccines at the pharmacy. -also can check with pharmacy to see if you had this  Schedule dentist follow up   depression  is doing well- wants to try to cut out evening dose of venlafaxine 75 mg - she will continue the venlafaxine 150 mg XR in the morning  Please stop by lab before you go If you have mychart- we will send your results within 3 business days of [place] receiving them.  If you do not have mychart- we will call you about results within 5 business days of [place] receiving them.  *please also note that you will see labs on mychart as soon as they post. I will later go in and write notes on them- will say "notes from Dr. Durene Cal"    Recommended follow up: Return in about 6 months (around 07/29/2023) for followup or sooner if needed.Schedule b4 you leave. - sooner such as if depression worsens

## 2023-01-28 ENCOUNTER — Encounter: Payer: Medicare HMO | Admitting: Psychology

## 2023-01-28 DIAGNOSIS — R2689 Other abnormalities of gait and mobility: Secondary | ICD-10-CM | POA: Diagnosis not present

## 2023-01-28 DIAGNOSIS — M62551 Muscle wasting and atrophy, not elsewhere classified, right thigh: Secondary | ICD-10-CM | POA: Diagnosis not present

## 2023-01-28 DIAGNOSIS — R488 Other symbolic dysfunctions: Secondary | ICD-10-CM | POA: Diagnosis not present

## 2023-01-28 DIAGNOSIS — M62562 Muscle wasting and atrophy, not elsewhere classified, left lower leg: Secondary | ICD-10-CM | POA: Diagnosis not present

## 2023-01-28 DIAGNOSIS — M62552 Muscle wasting and atrophy, not elsewhere classified, left thigh: Secondary | ICD-10-CM | POA: Diagnosis not present

## 2023-01-28 DIAGNOSIS — M62561 Muscle wasting and atrophy, not elsewhere classified, right lower leg: Secondary | ICD-10-CM | POA: Diagnosis not present

## 2023-01-28 DIAGNOSIS — R2681 Unsteadiness on feet: Secondary | ICD-10-CM | POA: Diagnosis not present

## 2023-01-29 DIAGNOSIS — R2689 Other abnormalities of gait and mobility: Secondary | ICD-10-CM | POA: Diagnosis not present

## 2023-01-29 DIAGNOSIS — M62562 Muscle wasting and atrophy, not elsewhere classified, left lower leg: Secondary | ICD-10-CM | POA: Diagnosis not present

## 2023-01-29 DIAGNOSIS — M62561 Muscle wasting and atrophy, not elsewhere classified, right lower leg: Secondary | ICD-10-CM | POA: Diagnosis not present

## 2023-01-29 DIAGNOSIS — M62552 Muscle wasting and atrophy, not elsewhere classified, left thigh: Secondary | ICD-10-CM | POA: Diagnosis not present

## 2023-01-29 DIAGNOSIS — M62551 Muscle wasting and atrophy, not elsewhere classified, right thigh: Secondary | ICD-10-CM | POA: Diagnosis not present

## 2023-01-29 DIAGNOSIS — R488 Other symbolic dysfunctions: Secondary | ICD-10-CM | POA: Diagnosis not present

## 2023-01-29 DIAGNOSIS — R2681 Unsteadiness on feet: Secondary | ICD-10-CM | POA: Diagnosis not present

## 2023-01-30 ENCOUNTER — Other Ambulatory Visit: Payer: Self-pay | Admitting: Family Medicine

## 2023-01-30 DIAGNOSIS — R488 Other symbolic dysfunctions: Secondary | ICD-10-CM | POA: Diagnosis not present

## 2023-01-30 DIAGNOSIS — M62551 Muscle wasting and atrophy, not elsewhere classified, right thigh: Secondary | ICD-10-CM | POA: Diagnosis not present

## 2023-01-30 DIAGNOSIS — M62562 Muscle wasting and atrophy, not elsewhere classified, left lower leg: Secondary | ICD-10-CM | POA: Diagnosis not present

## 2023-01-30 DIAGNOSIS — M62552 Muscle wasting and atrophy, not elsewhere classified, left thigh: Secondary | ICD-10-CM | POA: Diagnosis not present

## 2023-01-30 DIAGNOSIS — R2681 Unsteadiness on feet: Secondary | ICD-10-CM | POA: Diagnosis not present

## 2023-01-30 DIAGNOSIS — R2689 Other abnormalities of gait and mobility: Secondary | ICD-10-CM | POA: Diagnosis not present

## 2023-01-30 DIAGNOSIS — M62561 Muscle wasting and atrophy, not elsewhere classified, right lower leg: Secondary | ICD-10-CM | POA: Diagnosis not present

## 2023-01-30 LAB — URINE CULTURE
MICRO NUMBER:: 15096771
SPECIMEN QUALITY:: ADEQUATE

## 2023-01-30 MED ORDER — CEPHALEXIN 500 MG PO CAPS: 500.0000 mg | ORAL_CAPSULE | Freq: Three times a day (TID) | ORAL | 0 refills | Status: AC

## 2023-02-02 DIAGNOSIS — M62561 Muscle wasting and atrophy, not elsewhere classified, right lower leg: Secondary | ICD-10-CM | POA: Diagnosis not present

## 2023-02-02 DIAGNOSIS — R488 Other symbolic dysfunctions: Secondary | ICD-10-CM | POA: Diagnosis not present

## 2023-02-02 DIAGNOSIS — M62552 Muscle wasting and atrophy, not elsewhere classified, left thigh: Secondary | ICD-10-CM | POA: Diagnosis not present

## 2023-02-02 DIAGNOSIS — R2689 Other abnormalities of gait and mobility: Secondary | ICD-10-CM | POA: Diagnosis not present

## 2023-02-02 DIAGNOSIS — M62562 Muscle wasting and atrophy, not elsewhere classified, left lower leg: Secondary | ICD-10-CM | POA: Diagnosis not present

## 2023-02-02 DIAGNOSIS — M62551 Muscle wasting and atrophy, not elsewhere classified, right thigh: Secondary | ICD-10-CM | POA: Diagnosis not present

## 2023-02-02 DIAGNOSIS — R2681 Unsteadiness on feet: Secondary | ICD-10-CM | POA: Diagnosis not present

## 2023-02-03 ENCOUNTER — Encounter: Payer: Self-pay | Admitting: Physician Assistant

## 2023-02-03 ENCOUNTER — Ambulatory Visit: Payer: Medicare HMO | Admitting: Physician Assistant

## 2023-02-03 VITALS — BP 123/75 | HR 71 | Resp 18 | Ht 60.0 in | Wt 151.0 lb

## 2023-02-03 DIAGNOSIS — G3184 Mild cognitive impairment, so stated: Secondary | ICD-10-CM | POA: Diagnosis not present

## 2023-02-03 DIAGNOSIS — R2689 Other abnormalities of gait and mobility: Secondary | ICD-10-CM | POA: Diagnosis not present

## 2023-02-03 DIAGNOSIS — M62561 Muscle wasting and atrophy, not elsewhere classified, right lower leg: Secondary | ICD-10-CM | POA: Diagnosis not present

## 2023-02-03 DIAGNOSIS — R413 Other amnesia: Secondary | ICD-10-CM

## 2023-02-03 DIAGNOSIS — R2681 Unsteadiness on feet: Secondary | ICD-10-CM | POA: Diagnosis not present

## 2023-02-03 DIAGNOSIS — R488 Other symbolic dysfunctions: Secondary | ICD-10-CM | POA: Diagnosis not present

## 2023-02-03 DIAGNOSIS — M62551 Muscle wasting and atrophy, not elsewhere classified, right thigh: Secondary | ICD-10-CM | POA: Diagnosis not present

## 2023-02-03 DIAGNOSIS — M62562 Muscle wasting and atrophy, not elsewhere classified, left lower leg: Secondary | ICD-10-CM | POA: Diagnosis not present

## 2023-02-03 DIAGNOSIS — M62552 Muscle wasting and atrophy, not elsewhere classified, left thigh: Secondary | ICD-10-CM | POA: Diagnosis not present

## 2023-02-03 MED ORDER — DONEPEZIL HCL 5 MG PO TABS: 5.0000 mg | ORAL_TABLET | Freq: Every evening | ORAL | 11 refills | Status: DC
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
## 2023-02-03 NOTE — Patient Instructions (Addendum)
It was a pleasure to see you today at our office.   Recommendations:  Repeat Neurocognitive evaluation at our office in 1 year  Start Donepezil 5mg  daily. Side effects discussed   Replenish B12 daily  Follow up in 6 months  Whom to call:  Memory  decline, memory medications: Call our office [phone]   For psychiatric meds, mood meds: Please have your primary care physician manage these medications.      For assessment of decision of mental capacity and competency:  Call Dr. Erick Blinks, geriatric psychiatrist at [phone]  For guidance in geriatric dementia issues please call [facility] [phone]   If you have any severe symptoms of a stroke, or other severe issues such as confusion,severe chills or fever, etc call 911 or go to the ER as you may need to be evaluated further      RECOMMENDATIONS FOR ALL PATIENTS WITH MEMORY PROBLEMS: 1. Continue to exercise (Recommend 30 minutes of walking everyday, or 3 hours every week) 2. Increase social interactions - continue going to [place] and enjoy social gatherings with friends and family 3. Eat healthy, avoid fried foods and eat more fruits and vegetables 4. Maintain adequate blood pressure, blood sugar, and blood cholesterol level. Reducing the risk of stroke and cardiovascular disease also helps promoting better memory. 5. Avoid stressful situations. Live a simple life and avoid aggravations. Organize your time and prepare for the next day in anticipation. 6. Sleep well, avoid any interruptions of sleep and avoid any distractions in the bedroom that may interfere with adequate sleep quality 7. Avoid sugar, avoid sweets as there is a strong link between excessive sugar intake, diabetes, and cognitive impairment We discussed the Mediterranean diet, which has been shown to help patients reduce the risk of progressive memory disorders and reduces cardiovascular risk. This includes eating fish, eat fruits and green  leafy vegetables, nuts like almonds and hazelnuts, walnuts, and also use olive oil. Avoid fast foods and fried foods as much as possible. Avoid sweets and sugar as sugar use has been linked to worsening of memory function.  There is always a concern of gradual progression of memory problems. If this is the case, then we may need to adjust level of care according to patient needs. Support, both to the patient and caregiver, should then be put into place.      You have been referred for a neuropsychological evaluation (i.e., evaluation of memory and thinking abilities). Please bring someone with you to this appointment if possible, as it is helpful for the doctor to hear from both you and another adult who knows you well. Please bring eyeglasses and hearing aids if you wear them.    The evaluation will take approximately 3 hours and has two parts:   The first part is a clinical interview with the neuropsychologist (Dr. Milbert Coulter or Dr. Roseanne Reno). During the interview, the neuropsychologist will speak with you and the individual you brought to the appointment.    The second part of the evaluation is testing with the doctor's technician Annabelle Harman or Selena Batten). During the testing, the technician will ask you to remember different types of material, solve problems, and answer some questionnaires. Your family member will not be present for this portion of the evaluation.   Please note: We must reserve several hours of the neuropsychologist's time and the psychometrician's time for your evaluation appointment. As such, there is a No-Show fee of $100. If you are unable to attend any of your  appointments, please contact our office as soon as possible to reschedule.    FALL PRECAUTIONS: Be cautious when walking. Scan the area for obstacles that may increase the risk of trips and falls. When getting up in the mornings, sit up at the edge of the bed for a few minutes before getting out of bed. Consider elevating the bed at  the head end to avoid drop of blood pressure when getting up. Walk always in a well-lit room (use night lights in the walls). Avoid area rugs or power cords from appliances in the middle of the walkways. Use a walker or a cane if necessary and consider physical therapy for balance exercise. Get your eyesight checked regularly.  FINANCIAL OVERSIGHT: Supervision, especially oversight when making financial decisions or transactions is also recommended.  HOME SAFETY: Consider the safety of the kitchen when operating appliances like stoves, microwave oven, and blender. Consider having supervision and share cooking responsibilities until no longer able to participate in those. Accidents with firearms and other hazards in the house should be identified and addressed as well.   ABILITY TO BE LEFT ALONE: If patient is unable to contact 911 operator, consider using LifeLine, or when the need is there, arrange for someone to stay with patients. Smoking is a fire hazard, consider supervision or cessation. Risk of wandering should be assessed by caregiver and if detected at any point, supervision and safe proof recommendations should be instituted.  MEDICATION SUPERVISION: Inability to self-administer medication needs to be constantly addressed. Implement a mechanism to ensure safe administration of the medications.   DRIVING: Regarding driving, in patients with progressive memory problems, driving will be impaired. We advise to have someone else do the driving if trouble finding directions or if minor accidents are reported. Independent driving assessment is available to determine safety of driving.   If you are interested in the driving assessment, you can contact the following:  [employer] in [place] [phone]  [facility] [phone]  [facility] [phone] or [phone]    Mediterranean Diet A Mediterranean diet  refers to food and lifestyle choices that are based on the traditions of countries located on the [employer]. This way of eating has been shown to help prevent certain conditions and improve outcomes for people who have chronic diseases, like kidney disease and heart disease. What are tips for following this plan? Lifestyle  Cook and eat meals together with your family, when possible. Drink enough fluid to keep your urine clear or pale yellow. Be physically active every day. This includes: Aerobic exercise like running or swimming. Leisure activities like gardening, walking, or housework. Get 7-8 hours of sleep each night. If recommended by your health care provider, drink red wine in moderation. This means 1 glass a day for nonpregnant women and 2 glasses a day for men. A glass of wine equals 5 oz (150 mL). Reading food labels  Check the serving size of packaged foods. For foods such as rice and pasta, the serving size refers to the amount of cooked product, not dry. Check the total fat in packaged foods. Avoid foods that have saturated fat or trans fats. Check the ingredients list for added sugars, such as corn syrup. Shopping  At the grocery store, buy most of your food from the areas near the walls of the store. This includes: Fresh fruits and vegetables (produce). Grains, beans, nuts, and seeds. Some of these may be available in unpackaged forms or  large amounts (in bulk). Fresh seafood. Poultry and eggs. Low-fat dairy products. Buy whole ingredients instead of prepackaged foods. Buy fresh fruits and vegetables in-season from local farmers markets. Buy frozen fruits and vegetables in resealable bags. If you do not have access to quality fresh seafood, buy precooked frozen shrimp or canned fish, such as tuna, salmon, or sardines. Buy small amounts of raw or cooked vegetables, salads, or olives from the deli or salad bar at your store. Stock your pantry so you always have certain  foods on hand, such as olive oil, canned tuna, canned tomatoes, rice, pasta, and beans. Cooking  Cook foods with extra-virgin olive oil instead of using butter or other vegetable oils. Have meat as a side dish, and have vegetables or grains as your main dish. This means having meat in small portions or adding small amounts of meat to foods like pasta or stew. Use beans or vegetables instead of meat in common dishes like chili or lasagna. Experiment with different cooking methods. Try roasting or broiling vegetables instead of steaming or sauteing them. Add frozen vegetables to soups, stews, pasta, or rice. Add nuts or seeds for added healthy fat at each meal. You can add these to yogurt, salads, or vegetable dishes. Marinate fish or vegetables using olive oil, lemon juice, garlic, and fresh herbs. Meal planning  Plan to eat 1 vegetarian meal one day each week. Try to work up to 2 vegetarian meals, if possible. Eat seafood 2 or more times a week. Have healthy snacks readily available, such as: Vegetable sticks with hummus. Greek yogurt. Fruit and nut trail mix. Eat balanced meals throughout the week. This includes: Fruit: 2-3 servings a day Vegetables: 4-5 servings a day Low-fat dairy: 2 servings a day Fish, poultry, or lean meat: 1 serving a day Beans and legumes: 2 or more servings a week Nuts and seeds: 1-2 servings a day Whole grains: 6-8 servings a day Extra-virgin olive oil: 3-4 servings a day Limit red meat and sweets to only a few servings a month What are my food choices? Mediterranean diet Recommended Grains: Whole-grain pasta. Brown rice. Bulgar wheat. Polenta. Couscous. Whole-wheat bread. Orpah Cobb. Vegetables: Artichokes. Beets. Broccoli. Cabbage. Carrots. Eggplant. Green beans. Chard. Kale. Spinach. Onions. Leeks. Peas. Squash. Tomatoes. Peppers. Radishes. Fruits: Apples. Apricots. Avocado. Berries. Bananas. Cherries. Dates. Figs. Grapes. Lemons. Melon. Oranges.  Peaches. Plums. Pomegranate. Meats and other protein foods: Beans. Almonds. Sunflower seeds. Pine nuts. Peanuts. Cod. Salmon. Scallops. Shrimp. Tuna. Tilapia. Clams. Oysters. Eggs. Dairy: Low-fat milk. Cheese. Greek yogurt. Beverages: Water. Red wine. Herbal tea. Fats and oils: Extra virgin olive oil. Avocado oil. Grape seed oil. Sweets and desserts: [place] yogurt with honey. Baked apples. Poached pears. Trail mix. Seasoning and other foods: Basil. Cilantro. Coriander. Cumin. Mint. Parsley. Sage. Rosemary. Tarragon. Garlic. Oregano. Thyme. Pepper. Balsalmic vinegar. Tahini. Hummus. Tomato sauce. Olives. Mushrooms. Limit these Grains: Prepackaged pasta or rice dishes. Prepackaged cereal with added sugar. Vegetables: Deep fried potatoes (french fries). Fruits: Fruit canned in syrup. Meats and other protein foods: Beef. Pork. Lamb. Poultry with skin. Hot dogs. Tomasa Blase. Dairy: Ice cream. Sour cream. Whole milk. Beverages: Juice. Sugar-sweetened soft drinks. Beer. Liquor and spirits. Fats and oils: Butter. Canola oil. Vegetable oil. Beef fat (tallow). Lard. Sweets and desserts: Cookies. Cakes. Pies. Candy. Seasoning and other foods: Mayonnaise. Premade sauces and marinades. The items listed may not be a complete list. Talk with your dietitian about what dietary choices are right for you. Summary The Mediterranean diet includes both food  and lifestyle choices. Eat a variety of fresh fruits and vegetables, beans, nuts, seeds, and whole grains. Limit the amount of red meat and sweets that you eat. Talk with your health care provider about whether it is safe for you to drink red wine in moderation. This means 1 glass a day for nonpregnant women and 2 glasses a day for men. A glass of wine equals 5 oz (150 mL). This information is not intended to replace advice given to you by your health care provider. Make sure you discuss any questions you have with your health care provider. Document Released:  03/20/2016 Document Revised: 04/22/2016 Document Reviewed: 03/20/2016 Elsevier Interactive Patient Education  2017 [employer].   Labs today [address] [facility] [phone]

## 2023-02-04 DIAGNOSIS — M62562 Muscle wasting and atrophy, not elsewhere classified, left lower leg: Secondary | ICD-10-CM | POA: Diagnosis not present

## 2023-02-04 DIAGNOSIS — R2689 Other abnormalities of gait and mobility: Secondary | ICD-10-CM | POA: Diagnosis not present

## 2023-02-04 DIAGNOSIS — R2681 Unsteadiness on feet: Secondary | ICD-10-CM | POA: Diagnosis not present

## 2023-02-04 DIAGNOSIS — M62552 Muscle wasting and atrophy, not elsewhere classified, left thigh: Secondary | ICD-10-CM | POA: Diagnosis not present

## 2023-02-04 DIAGNOSIS — M62561 Muscle wasting and atrophy, not elsewhere classified, right lower leg: Secondary | ICD-10-CM | POA: Diagnosis not present

## 2023-02-04 DIAGNOSIS — M62551 Muscle wasting and atrophy, not elsewhere classified, right thigh: Secondary | ICD-10-CM | POA: Diagnosis not present

## 2023-02-04 DIAGNOSIS — R488 Other symbolic dysfunctions: Secondary | ICD-10-CM | POA: Diagnosis not present

## 2023-02-05 ENCOUNTER — Telehealth: Payer: Self-pay | Admitting: Physician Assistant

## 2023-02-05 DIAGNOSIS — R2689 Other abnormalities of gait and mobility: Secondary | ICD-10-CM | POA: Diagnosis not present

## 2023-02-05 DIAGNOSIS — M62552 Muscle wasting and atrophy, not elsewhere classified, left thigh: Secondary | ICD-10-CM | POA: Diagnosis not present

## 2023-02-05 DIAGNOSIS — R488 Other symbolic dysfunctions: Secondary | ICD-10-CM | POA: Diagnosis not present

## 2023-02-05 DIAGNOSIS — R2681 Unsteadiness on feet: Secondary | ICD-10-CM | POA: Diagnosis not present

## 2023-02-05 DIAGNOSIS — M62562 Muscle wasting and atrophy, not elsewhere classified, left lower leg: Secondary | ICD-10-CM | POA: Diagnosis not present

## 2023-02-05 DIAGNOSIS — M62561 Muscle wasting and atrophy, not elsewhere classified, right lower leg: Secondary | ICD-10-CM | POA: Diagnosis not present

## 2023-02-05 DIAGNOSIS — M62551 Muscle wasting and atrophy, not elsewhere classified, right thigh: Secondary | ICD-10-CM | POA: Diagnosis not present

## 2023-02-05 NOTE — Telephone Encounter (Signed)
Left  message with the after service on 02-05-23   She states that she got a call from sara or her nurse and would like a call back

## 2023-02-05 NOTE — Telephone Encounter (Signed)
Called patient back, void call, I think it was a reminder call

## 2023-02-06 DIAGNOSIS — M62562 Muscle wasting and atrophy, not elsewhere classified, left lower leg: Secondary | ICD-10-CM | POA: Diagnosis not present

## 2023-02-06 DIAGNOSIS — R2689 Other abnormalities of gait and mobility: Secondary | ICD-10-CM | POA: Diagnosis not present

## 2023-02-06 DIAGNOSIS — M62552 Muscle wasting and atrophy, not elsewhere classified, left thigh: Secondary | ICD-10-CM | POA: Diagnosis not present

## 2023-02-06 DIAGNOSIS — R2681 Unsteadiness on feet: Secondary | ICD-10-CM | POA: Diagnosis not present

## 2023-02-06 DIAGNOSIS — M62561 Muscle wasting and atrophy, not elsewhere classified, right lower leg: Secondary | ICD-10-CM | POA: Diagnosis not present

## 2023-02-06 DIAGNOSIS — R488 Other symbolic dysfunctions: Secondary | ICD-10-CM | POA: Diagnosis not present

## 2023-02-06 DIAGNOSIS — M62551 Muscle wasting and atrophy, not elsewhere classified, right thigh: Secondary | ICD-10-CM | POA: Diagnosis not present

## 2023-02-09 DIAGNOSIS — R2689 Other abnormalities of gait and mobility: Secondary | ICD-10-CM | POA: Diagnosis not present

## 2023-02-09 DIAGNOSIS — R2681 Unsteadiness on feet: Secondary | ICD-10-CM | POA: Diagnosis not present

## 2023-02-09 DIAGNOSIS — R488 Other symbolic dysfunctions: Secondary | ICD-10-CM | POA: Diagnosis not present

## 2023-02-09 DIAGNOSIS — M62561 Muscle wasting and atrophy, not elsewhere classified, right lower leg: Secondary | ICD-10-CM | POA: Diagnosis not present

## 2023-02-09 DIAGNOSIS — M62562 Muscle wasting and atrophy, not elsewhere classified, left lower leg: Secondary | ICD-10-CM | POA: Diagnosis not present

## 2023-02-09 DIAGNOSIS — M62551 Muscle wasting and atrophy, not elsewhere classified, right thigh: Secondary | ICD-10-CM | POA: Diagnosis not present

## 2023-02-09 DIAGNOSIS — M62552 Muscle wasting and atrophy, not elsewhere classified, left thigh: Secondary | ICD-10-CM | POA: Diagnosis not present

## 2023-02-11 DIAGNOSIS — M62552 Muscle wasting and atrophy, not elsewhere classified, left thigh: Secondary | ICD-10-CM | POA: Diagnosis not present

## 2023-02-11 DIAGNOSIS — M62562 Muscle wasting and atrophy, not elsewhere classified, left lower leg: Secondary | ICD-10-CM | POA: Diagnosis not present

## 2023-02-11 DIAGNOSIS — M62551 Muscle wasting and atrophy, not elsewhere classified, right thigh: Secondary | ICD-10-CM | POA: Diagnosis not present

## 2023-02-11 DIAGNOSIS — R488 Other symbolic dysfunctions: Secondary | ICD-10-CM | POA: Diagnosis not present

## 2023-02-11 DIAGNOSIS — R2681 Unsteadiness on feet: Secondary | ICD-10-CM | POA: Diagnosis not present

## 2023-02-11 DIAGNOSIS — R2689 Other abnormalities of gait and mobility: Secondary | ICD-10-CM | POA: Diagnosis not present

## 2023-02-11 DIAGNOSIS — M62561 Muscle wasting and atrophy, not elsewhere classified, right lower leg: Secondary | ICD-10-CM | POA: Diagnosis not present

## 2023-02-12 DIAGNOSIS — R488 Other symbolic dysfunctions: Secondary | ICD-10-CM | POA: Diagnosis not present

## 2023-02-12 DIAGNOSIS — M62552 Muscle wasting and atrophy, not elsewhere classified, left thigh: Secondary | ICD-10-CM | POA: Diagnosis not present

## 2023-02-12 DIAGNOSIS — M62551 Muscle wasting and atrophy, not elsewhere classified, right thigh: Secondary | ICD-10-CM | POA: Diagnosis not present

## 2023-02-12 DIAGNOSIS — M62561 Muscle wasting and atrophy, not elsewhere classified, right lower leg: Secondary | ICD-10-CM | POA: Diagnosis not present

## 2023-02-12 DIAGNOSIS — R2681 Unsteadiness on feet: Secondary | ICD-10-CM | POA: Diagnosis not present

## 2023-02-12 DIAGNOSIS — R2689 Other abnormalities of gait and mobility: Secondary | ICD-10-CM | POA: Diagnosis not present

## 2023-02-12 DIAGNOSIS — M62562 Muscle wasting and atrophy, not elsewhere classified, left lower leg: Secondary | ICD-10-CM | POA: Diagnosis not present

## 2023-02-13 DIAGNOSIS — R2689 Other abnormalities of gait and mobility: Secondary | ICD-10-CM | POA: Diagnosis not present

## 2023-02-13 DIAGNOSIS — M62562 Muscle wasting and atrophy, not elsewhere classified, left lower leg: Secondary | ICD-10-CM | POA: Diagnosis not present

## 2023-02-13 DIAGNOSIS — R2681 Unsteadiness on feet: Secondary | ICD-10-CM | POA: Diagnosis not present

## 2023-02-13 DIAGNOSIS — M62551 Muscle wasting and atrophy, not elsewhere classified, right thigh: Secondary | ICD-10-CM | POA: Diagnosis not present

## 2023-02-13 DIAGNOSIS — R488 Other symbolic dysfunctions: Secondary | ICD-10-CM | POA: Diagnosis not present

## 2023-02-13 DIAGNOSIS — M62561 Muscle wasting and atrophy, not elsewhere classified, right lower leg: Secondary | ICD-10-CM | POA: Diagnosis not present

## 2023-02-13 DIAGNOSIS — M62552 Muscle wasting and atrophy, not elsewhere classified, left thigh: Secondary | ICD-10-CM | POA: Diagnosis not present

## 2023-02-15 DIAGNOSIS — M62561 Muscle wasting and atrophy, not elsewhere classified, right lower leg: Secondary | ICD-10-CM | POA: Diagnosis not present

## 2023-02-15 DIAGNOSIS — R2689 Other abnormalities of gait and mobility: Secondary | ICD-10-CM | POA: Diagnosis not present

## 2023-02-15 DIAGNOSIS — R2681 Unsteadiness on feet: Secondary | ICD-10-CM | POA: Diagnosis not present

## 2023-02-15 DIAGNOSIS — M62562 Muscle wasting and atrophy, not elsewhere classified, left lower leg: Secondary | ICD-10-CM | POA: Diagnosis not present

## 2023-02-15 DIAGNOSIS — R488 Other symbolic dysfunctions: Secondary | ICD-10-CM | POA: Diagnosis not present

## 2023-02-15 DIAGNOSIS — M62552 Muscle wasting and atrophy, not elsewhere classified, left thigh: Secondary | ICD-10-CM | POA: Diagnosis not present

## 2023-02-15 DIAGNOSIS — M62551 Muscle wasting and atrophy, not elsewhere classified, right thigh: Secondary | ICD-10-CM | POA: Diagnosis not present

## 2023-02-16 DIAGNOSIS — R488 Other symbolic dysfunctions: Secondary | ICD-10-CM | POA: Diagnosis not present

## 2023-02-16 DIAGNOSIS — M62561 Muscle wasting and atrophy, not elsewhere classified, right lower leg: Secondary | ICD-10-CM | POA: Diagnosis not present

## 2023-02-16 DIAGNOSIS — R2681 Unsteadiness on feet: Secondary | ICD-10-CM | POA: Diagnosis not present

## 2023-02-16 DIAGNOSIS — R2689 Other abnormalities of gait and mobility: Secondary | ICD-10-CM | POA: Diagnosis not present

## 2023-02-16 DIAGNOSIS — M62562 Muscle wasting and atrophy, not elsewhere classified, left lower leg: Secondary | ICD-10-CM | POA: Diagnosis not present

## 2023-02-16 DIAGNOSIS — M62552 Muscle wasting and atrophy, not elsewhere classified, left thigh: Secondary | ICD-10-CM | POA: Diagnosis not present

## 2023-02-16 DIAGNOSIS — M62551 Muscle wasting and atrophy, not elsewhere classified, right thigh: Secondary | ICD-10-CM | POA: Diagnosis not present

## 2023-02-17 DIAGNOSIS — R2681 Unsteadiness on feet: Secondary | ICD-10-CM | POA: Diagnosis not present

## 2023-02-17 DIAGNOSIS — M62562 Muscle wasting and atrophy, not elsewhere classified, left lower leg: Secondary | ICD-10-CM | POA: Diagnosis not present

## 2023-02-17 DIAGNOSIS — M62552 Muscle wasting and atrophy, not elsewhere classified, left thigh: Secondary | ICD-10-CM | POA: Diagnosis not present

## 2023-02-17 DIAGNOSIS — M62551 Muscle wasting and atrophy, not elsewhere classified, right thigh: Secondary | ICD-10-CM | POA: Diagnosis not present

## 2023-02-17 DIAGNOSIS — R2689 Other abnormalities of gait and mobility: Secondary | ICD-10-CM | POA: Diagnosis not present

## 2023-02-17 DIAGNOSIS — R488 Other symbolic dysfunctions: Secondary | ICD-10-CM | POA: Diagnosis not present

## 2023-02-17 DIAGNOSIS — M62561 Muscle wasting and atrophy, not elsewhere classified, right lower leg: Secondary | ICD-10-CM | POA: Diagnosis not present

## 2023-02-18 DIAGNOSIS — M62561 Muscle wasting and atrophy, not elsewhere classified, right lower leg: Secondary | ICD-10-CM | POA: Diagnosis not present

## 2023-02-18 DIAGNOSIS — R488 Other symbolic dysfunctions: Secondary | ICD-10-CM | POA: Diagnosis not present

## 2023-02-18 DIAGNOSIS — M62562 Muscle wasting and atrophy, not elsewhere classified, left lower leg: Secondary | ICD-10-CM | POA: Diagnosis not present

## 2023-02-18 DIAGNOSIS — M62552 Muscle wasting and atrophy, not elsewhere classified, left thigh: Secondary | ICD-10-CM | POA: Diagnosis not present

## 2023-02-18 DIAGNOSIS — M62551 Muscle wasting and atrophy, not elsewhere classified, right thigh: Secondary | ICD-10-CM | POA: Diagnosis not present

## 2023-02-18 DIAGNOSIS — R2689 Other abnormalities of gait and mobility: Secondary | ICD-10-CM | POA: Diagnosis not present

## 2023-02-18 DIAGNOSIS — R2681 Unsteadiness on feet: Secondary | ICD-10-CM | POA: Diagnosis not present

## 2023-02-19 DIAGNOSIS — R2689 Other abnormalities of gait and mobility: Secondary | ICD-10-CM | POA: Diagnosis not present

## 2023-02-19 DIAGNOSIS — M62551 Muscle wasting and atrophy, not elsewhere classified, right thigh: Secondary | ICD-10-CM | POA: Diagnosis not present

## 2023-02-19 DIAGNOSIS — M62552 Muscle wasting and atrophy, not elsewhere classified, left thigh: Secondary | ICD-10-CM | POA: Diagnosis not present

## 2023-02-19 DIAGNOSIS — M62562 Muscle wasting and atrophy, not elsewhere classified, left lower leg: Secondary | ICD-10-CM | POA: Diagnosis not present

## 2023-02-19 DIAGNOSIS — M62561 Muscle wasting and atrophy, not elsewhere classified, right lower leg: Secondary | ICD-10-CM | POA: Diagnosis not present

## 2023-02-19 DIAGNOSIS — R488 Other symbolic dysfunctions: Secondary | ICD-10-CM | POA: Diagnosis not present

## 2023-02-19 DIAGNOSIS — R2681 Unsteadiness on feet: Secondary | ICD-10-CM | POA: Diagnosis not present

## 2023-02-23 DIAGNOSIS — M62552 Muscle wasting and atrophy, not elsewhere classified, left thigh: Secondary | ICD-10-CM | POA: Diagnosis not present

## 2023-02-23 DIAGNOSIS — R2689 Other abnormalities of gait and mobility: Secondary | ICD-10-CM | POA: Diagnosis not present

## 2023-02-23 DIAGNOSIS — R488 Other symbolic dysfunctions: Secondary | ICD-10-CM | POA: Diagnosis not present

## 2023-02-23 DIAGNOSIS — R2681 Unsteadiness on feet: Secondary | ICD-10-CM | POA: Diagnosis not present

## 2023-02-23 DIAGNOSIS — M62561 Muscle wasting and atrophy, not elsewhere classified, right lower leg: Secondary | ICD-10-CM | POA: Diagnosis not present

## 2023-02-23 DIAGNOSIS — M62551 Muscle wasting and atrophy, not elsewhere classified, right thigh: Secondary | ICD-10-CM | POA: Diagnosis not present

## 2023-02-23 DIAGNOSIS — M62562 Muscle wasting and atrophy, not elsewhere classified, left lower leg: Secondary | ICD-10-CM | POA: Diagnosis not present

## 2023-02-24 DIAGNOSIS — M62552 Muscle wasting and atrophy, not elsewhere classified, left thigh: Secondary | ICD-10-CM | POA: Diagnosis not present

## 2023-02-24 DIAGNOSIS — M62561 Muscle wasting and atrophy, not elsewhere classified, right lower leg: Secondary | ICD-10-CM | POA: Diagnosis not present

## 2023-02-24 DIAGNOSIS — M62562 Muscle wasting and atrophy, not elsewhere classified, left lower leg: Secondary | ICD-10-CM | POA: Diagnosis not present

## 2023-02-24 DIAGNOSIS — R488 Other symbolic dysfunctions: Secondary | ICD-10-CM | POA: Diagnosis not present

## 2023-02-24 DIAGNOSIS — M62551 Muscle wasting and atrophy, not elsewhere classified, right thigh: Secondary | ICD-10-CM | POA: Diagnosis not present

## 2023-02-24 DIAGNOSIS — R2681 Unsteadiness on feet: Secondary | ICD-10-CM | POA: Diagnosis not present

## 2023-02-24 DIAGNOSIS — R2689 Other abnormalities of gait and mobility: Secondary | ICD-10-CM | POA: Diagnosis not present

## 2023-02-25 DIAGNOSIS — R2689 Other abnormalities of gait and mobility: Secondary | ICD-10-CM | POA: Diagnosis not present

## 2023-02-25 DIAGNOSIS — M62552 Muscle wasting and atrophy, not elsewhere classified, left thigh: Secondary | ICD-10-CM | POA: Diagnosis not present

## 2023-02-25 DIAGNOSIS — M62561 Muscle wasting and atrophy, not elsewhere classified, right lower leg: Secondary | ICD-10-CM | POA: Diagnosis not present

## 2023-02-25 DIAGNOSIS — M62562 Muscle wasting and atrophy, not elsewhere classified, left lower leg: Secondary | ICD-10-CM | POA: Diagnosis not present

## 2023-02-25 DIAGNOSIS — R2681 Unsteadiness on feet: Secondary | ICD-10-CM | POA: Diagnosis not present

## 2023-02-25 DIAGNOSIS — R488 Other symbolic dysfunctions: Secondary | ICD-10-CM | POA: Diagnosis not present

## 2023-02-25 DIAGNOSIS — M62551 Muscle wasting and atrophy, not elsewhere classified, right thigh: Secondary | ICD-10-CM | POA: Diagnosis not present

## 2023-02-26 DIAGNOSIS — M62562 Muscle wasting and atrophy, not elsewhere classified, left lower leg: Secondary | ICD-10-CM | POA: Diagnosis not present

## 2023-02-26 DIAGNOSIS — M62552 Muscle wasting and atrophy, not elsewhere classified, left thigh: Secondary | ICD-10-CM | POA: Diagnosis not present

## 2023-02-26 DIAGNOSIS — M62561 Muscle wasting and atrophy, not elsewhere classified, right lower leg: Secondary | ICD-10-CM | POA: Diagnosis not present

## 2023-02-26 DIAGNOSIS — R2681 Unsteadiness on feet: Secondary | ICD-10-CM | POA: Diagnosis not present

## 2023-02-26 DIAGNOSIS — R488 Other symbolic dysfunctions: Secondary | ICD-10-CM | POA: Diagnosis not present

## 2023-02-26 DIAGNOSIS — M62551 Muscle wasting and atrophy, not elsewhere classified, right thigh: Secondary | ICD-10-CM | POA: Diagnosis not present

## 2023-02-26 DIAGNOSIS — R2689 Other abnormalities of gait and mobility: Secondary | ICD-10-CM | POA: Diagnosis not present

## 2023-02-28 ENCOUNTER — Emergency Department (HOSPITAL_COMMUNITY)
Admission: EM | Admit: 2023-02-28 | Discharge: 2023-02-28 | Disposition: A | Discharge status: Home / Self Care | Payer: Medicare HMO | Attending: Emergency Medicine | Admitting: Emergency Medicine

## 2023-02-28 ENCOUNTER — Encounter (HOSPITAL_COMMUNITY): Payer: Self-pay | Admitting: Emergency Medicine

## 2023-02-28 ENCOUNTER — Emergency Department (HOSPITAL_COMMUNITY): Payer: Medicare HMO

## 2023-02-28 DIAGNOSIS — W010XXA Fall on same level from slipping, tripping and stumbling without subsequent striking against object, initial encounter: Secondary | ICD-10-CM | POA: Diagnosis not present

## 2023-02-28 DIAGNOSIS — Y92129 Unspecified place in nursing home as the place of occurrence of the external cause: Secondary | ICD-10-CM | POA: Diagnosis not present

## 2023-02-28 DIAGNOSIS — S0003XA Contusion of scalp, initial encounter: Secondary | ICD-10-CM | POA: Insufficient documentation

## 2023-02-28 DIAGNOSIS — R42 Dizziness and giddiness: Secondary | ICD-10-CM | POA: Diagnosis not present

## 2023-02-28 DIAGNOSIS — I10 Essential (primary) hypertension: Secondary | ICD-10-CM | POA: Diagnosis not present

## 2023-02-28 DIAGNOSIS — S0990XA Unspecified injury of head, initial encounter: Secondary | ICD-10-CM | POA: Diagnosis not present

## 2023-02-28 DIAGNOSIS — W19XXXA Unspecified fall, initial encounter: Secondary | ICD-10-CM | POA: Diagnosis not present

## 2023-02-28 DIAGNOSIS — M542 Cervicalgia: Secondary | ICD-10-CM | POA: Insufficient documentation

## 2023-02-28 DIAGNOSIS — S199XXA Unspecified injury of neck, initial encounter: Secondary | ICD-10-CM | POA: Diagnosis not present

## 2023-02-28 NOTE — Discharge Instructions (Addendum)
Thank you for allowing [place] to be part of your care today.  You were evaluated in the ED for injuries related to a fall.  Your CT scans are reassuring and do not show any evidence of acute injury to your skull, neck, or brain.  You do have a small contusion to the back of your head, I recommend taking Tylenol every 6-8 hours as needed for this.  Return to the ED if you develop sudden worsening of your symptoms or if you have any new concerns.

## 2023-02-28 NOTE — ED Triage Notes (Signed)
Pt arrives via EMS from [facility] with mechanical fall while trying to spin at a party with her walker. Denies LOC or blood thinners.  EMS reports she hit right back of head, minor hematoma noted.

## 2023-02-28 NOTE — ED Provider Notes (Signed)
[place] [facility] DE[facility]MENT AT [facility] Provider Note   CSN: [id number] Arrival date & time: 02/28/23  1519     History  Chief Complaint  Patient presents with   Fall    [name] is a 87 y.o. female with past medical history significant for osteoarthritis, hyperlipidemia, hypertension, mild cognitive impairment, osteopenia presents to the ED via EMS from [facility] with mechanical fall.  Patient states that they were having a party in the facility and she was dancing, did a spin, and tripped and fell.  Patient struck the back of her head on the floor.  She denies loss of consciousness.  She does not take blood thinners.  She reports the right side of her neck and the back of her head are sore.  Denies other injury related to the incident, nausea, vomiting, back pain, lightheadedness, headache, dizziness.       Home Medications Prior to Admission medications   Medication Sig Start Date End Date Taking? Authorizing Provider  brimonidine-timolol (COMBIGAN) 0.2-0.5 % ophthalmic solution Place 1 drop into both eyes every 12 (twelve) hours.    [provider]  donepezil (ARICEPT) 5 MG tablet Take 1 tablet (5 mg total) by mouth at bedtime. 02/03/23   Marcos Eke, PA-C  ezetimibe-simvastatin (VYTORIN) 10-20 MG tablet TAKE 1 TABLET BY MOUTH EVERY DAY 07/18/22   Shelva Majestic, MD  Multiple Vitamins-Minerals (MULTIVITAMIN ADULT) CHEW Chew 2 each by mouth daily. Nature Made for Her    [provider]  nadolol (CORGARD) 20 MG tablet Take 1 tablet (20 mg total) by mouth at bedtime. 09/26/22   Shelva Majestic, MD  omeprazole (PRILOSEC) 20 MG capsule Take 20 mg by mouth daily. In the morning for acid reflux over the counter    [provider]  traZODone (DESYREL) 100 MG tablet TAKE 1/4 TO 1/2 TABLET BY MOUTH AT BEDTIME IF NEEDED FOR SLEEP. 08/06/22   Shelva Majestic, MD  venlafaxine XR (EFFEXOR-XR) 150 MG 24 hr capsule Take 1 capsule  (150 mg total) by mouth daily with breakfast. 09/12/22   Shelva Majestic, MD      Allergies    Citrate of magnesia, Codeine, and Shingrix [zoster vac recomb adjuvanted]    Review of Systems   Review of Systems  Gastrointestinal:  Negative for nausea and vomiting.  Musculoskeletal:  Positive for neck pain. Negative for back pain.  Neurological:  Negative for dizziness, syncope, light-headedness and headaches.    Physical Exam Updated Vital Signs BP (!) 131/59   Pulse 72   Temp (!) 97.4 F (36.3 C) (Oral)   Resp 16   Ht 5' (1.524 m)   Wt 68 kg   LMP 08/11/1974   SpO2 97%   BMI 29.28 kg/m  Physical Exam Vitals and nursing note reviewed.  Constitutional:      General: She is not in acute distress.    Appearance: Normal appearance. She is not ill-appearing or diaphoretic.  HENT:     Head: Normocephalic. Contusion present. No Battle's sign, masses or laceration.     Jaw: There is normal jaw occlusion.     Comments: Approximately 2.5 cm contusion to the right occipital region, tender to touch, no obvious mass/hematoma Cardiovascular:     Rate and Rhythm: Normal rate and regular rhythm.  Pulmonary:     Effort: Pulmonary effort is normal.  Musculoskeletal:     Cervical back: No crepitus. Pain with movement (with lateral rotation, R sided neck  pain) and muscular tenderness present. No spinous process tenderness. Normal range of motion.  Skin:    General: Skin is warm and dry.     Capillary Refill: Capillary refill takes less than 2 seconds.  Neurological:     Mental Status: She is alert. Mental status is at baseline.     GCS: GCS eye subscore is 4. GCS verbal subscore is 5. GCS motor subscore is 6.     Sensory: No sensory deficit.     Motor: No weakness.  Psychiatric:        Mood and Affect: Mood normal.        Behavior: Behavior normal.     ED Results / Procedures / Treatments   Labs (all labs ordered are listed, but only abnormal results are displayed) Labs Reviewed  - No data to display  EKG None  Radiology CT Head Wo Contrast  Result Date: 02/28/2023 CLINICAL DATA:  Head trauma, minor (Age >= 65y); Neck trauma (Age >= 65y) EXAM: CT HEAD WITHOUT CONTRAST CT CERVICAL SPINE WITHOUT CONTRAST TECHNIQUE: Multidetector CT imaging of the head and cervical spine was performed following the standard protocol without intravenous contrast. Multiplanar CT image reconstructions of the cervical spine were also generated. RADIATION DOSE REDUCTION: This exam was performed according to the departmental dose-optimization program which includes automated exposure control, adjustment of the mA and/or kV according to patient size and/or use of iterative reconstruction technique. COMPARISON:  None Available. FINDINGS: CT HEAD FINDINGS Brain: No evidence of acute infarction, hemorrhage, hydrocephalus, extra-axial collection or mass lesion/mass effect. Vascular: No hyperdense vessel or unexpected calcification. Skull: Normal. Negative for fracture or focal lesion. Sinuses/Orbits: No middle ear or mastoid effusion. Paranasal sinuses are clear. Bilateral lens replacement. Orbits are otherwise unremarkable. Other: None. CT CERVICAL SPINE FINDINGS Alignment: Trace retrolisthesis of C3 on C4. Trace anterolisthesis of C4 on C5. Skull base and vertebrae: No acute fracture. No primary bone lesion or focal pathologic process. Soft tissues and spinal canal: No prevertebral fluid or swelling. No visible canal hematoma. Disc levels:  No evidence of high-grade spinal canal stenosis. Upper chest: Negative. Other: None IMPRESSION: 1. No acute intracranial abnormality. 2. No acute fracture or traumatic subluxation of the cervical spine. Electronically Signed   By: Lorenza Cambridge M.D.   On: 02/28/2023 18:14   CT Cervical Spine Wo Contrast  Result Date: 02/28/2023 CLINICAL DATA:  Head trauma, minor (Age >= 65y); Neck trauma (Age >= 65y) EXAM: CT HEAD WITHOUT CONTRAST CT CERVICAL SPINE WITHOUT CONTRAST  TECHNIQUE: Multidetector CT imaging of the head and cervical spine was performed following the standard protocol without intravenous contrast. Multiplanar CT image reconstructions of the cervical spine were also generated. RADIATION DOSE REDUCTION: This exam was performed according to the departmental dose-optimization program which includes automated exposure control, adjustment of the mA and/or kV according to patient size and/or use of iterative reconstruction technique. COMPARISON:  None Available. FINDINGS: CT HEAD FINDINGS Brain: No evidence of acute infarction, hemorrhage, hydrocephalus, extra-axial collection or mass lesion/mass effect. Vascular: No hyperdense vessel or unexpected calcification. Skull: Normal. Negative for fracture or focal lesion. Sinuses/Orbits: No middle ear or mastoid effusion. Paranasal sinuses are clear. Bilateral lens replacement. Orbits are otherwise unremarkable. Other: None. CT CERVICAL SPINE FINDINGS Alignment: Trace retrolisthesis of C3 on C4. Trace anterolisthesis of C4 on C5. Skull base and vertebrae: No acute fracture. No primary bone lesion or focal pathologic process. Soft tissues and spinal canal: No prevertebral fluid or swelling. No visible canal hematoma. Disc levels:  No evidence of high-grade spinal canal stenosis. Upper chest: Negative. Other: None IMPRESSION: 1. No acute intracranial abnormality. 2. No acute fracture or traumatic subluxation of the cervical spine. Electronically Signed   By: Lorenza Cambridge M.D.   On: 02/28/2023 18:14    Procedures Procedures    Medications Ordered in ED Medications - No data to display  ED Course/ Medical Decision Making/ A&P                             Medical Decision Making Amount and/or Complexity of Data Reviewed Radiology: ordered.   This patient presents to the ED with chief complaint(s) of mechanical fall with pertinent past medical history of HTN, HLD, osteopenia, osteoarthritis.  The complaint involves an  extensive differential diagnosis and also carries with it a high risk of complications and morbidity.    The differential diagnosis includes acute skull fracture, acute intracranial injury/hemorrhage, acute fracture or subluxation of cervical spine   The initial plan is to obtain CT head and cervical spine  Initial Assessment:   Exam significant for overall well-appearing patient who is not in acute distress.  She is moving all extremities appropriately.  She does have a small, 2 and half centimeter, contusion to the right occipital region.  There is no palpable mass or obvious hematoma.  This area is tender to palpation.  She has normal ROM of the cervical spine with increased pain of lateral rotation.  Right side cervical muscular tenderness.  No midline tenderness.    Independent visualization and interpretation of imaging: I independently visualized the following imaging with scope of interpretation limited to determining acute life threatening conditions related to emergency care: CT head and cervical spine, which revealed no acute fracture, subluxation, or intracranial injury.   Disposition:   Patient CT scans are negative.  She reports that she does not have significant pain in her head and is feeling well.  Patient appropriate for discharge back to facility via [facility].  The patient has been appropriately medically screened and/or stabilized in the ED. I have low suspicion for any other emergent medical condition which would require further screening, evaluation or treatment in the ED or require inpatient management. At time of discharge the patient is hemodynamically stable and in no acute distress. I have discussed work-up results and diagnosis with patient and answered all questions. Patient is agreeable with discharge plan. We discussed strict return precautions for returning to the emergency department and they verbalized understanding.            Final Clinical Impression(s) / ED  Diagnoses Final diagnoses:  Fall, initial encounter  Injury of head, initial encounter    Rx / DC Orders ED Discharge Orders     None         Lenard Simmer, PA-C 02/28/23 1842    Rozelle Logan, DO 03/01/23 1517

## 2023-03-01 DIAGNOSIS — M62562 Muscle wasting and atrophy, not elsewhere classified, left lower leg: Secondary | ICD-10-CM | POA: Diagnosis not present

## 2023-03-01 DIAGNOSIS — R2681 Unsteadiness on feet: Secondary | ICD-10-CM | POA: Diagnosis not present

## 2023-03-01 DIAGNOSIS — M62551 Muscle wasting and atrophy, not elsewhere classified, right thigh: Secondary | ICD-10-CM | POA: Diagnosis not present

## 2023-03-01 DIAGNOSIS — R2689 Other abnormalities of gait and mobility: Secondary | ICD-10-CM | POA: Diagnosis not present

## 2023-03-01 DIAGNOSIS — M62552 Muscle wasting and atrophy, not elsewhere classified, left thigh: Secondary | ICD-10-CM | POA: Diagnosis not present

## 2023-03-01 DIAGNOSIS — R488 Other symbolic dysfunctions: Secondary | ICD-10-CM | POA: Diagnosis not present

## 2023-03-01 DIAGNOSIS — M62561 Muscle wasting and atrophy, not elsewhere classified, right lower leg: Secondary | ICD-10-CM | POA: Diagnosis not present

## 2023-03-02 ENCOUNTER — Telehealth: Payer: Self-pay | Admitting: *Deleted

## 2023-03-02 DIAGNOSIS — M62561 Muscle wasting and atrophy, not elsewhere classified, right lower leg: Secondary | ICD-10-CM | POA: Diagnosis not present

## 2023-03-02 DIAGNOSIS — R2681 Unsteadiness on feet: Secondary | ICD-10-CM | POA: Diagnosis not present

## 2023-03-02 DIAGNOSIS — R488 Other symbolic dysfunctions: Secondary | ICD-10-CM | POA: Diagnosis not present

## 2023-03-02 DIAGNOSIS — M62552 Muscle wasting and atrophy, not elsewhere classified, left thigh: Secondary | ICD-10-CM | POA: Diagnosis not present

## 2023-03-02 DIAGNOSIS — M62562 Muscle wasting and atrophy, not elsewhere classified, left lower leg: Secondary | ICD-10-CM | POA: Diagnosis not present

## 2023-03-02 DIAGNOSIS — M62551 Muscle wasting and atrophy, not elsewhere classified, right thigh: Secondary | ICD-10-CM | POA: Diagnosis not present

## 2023-03-02 DIAGNOSIS — R2689 Other abnormalities of gait and mobility: Secondary | ICD-10-CM | POA: Diagnosis not present

## 2023-03-02 NOTE — Telephone Encounter (Signed)
Transition Care Management Unsuccessful Follow-up Telephone Call  Date of discharge and from where:  [facility] 02/28/2023  Attempts:  1st Attempt  Reason for unsuccessful TCM follow-up call:  Left voice message

## 2023-03-03 ENCOUNTER — Telehealth: Payer: Self-pay | Admitting: *Deleted

## 2023-03-03 DIAGNOSIS — M62561 Muscle wasting and atrophy, not elsewhere classified, right lower leg: Secondary | ICD-10-CM | POA: Diagnosis not present

## 2023-03-03 DIAGNOSIS — M62552 Muscle wasting and atrophy, not elsewhere classified, left thigh: Secondary | ICD-10-CM | POA: Diagnosis not present

## 2023-03-03 DIAGNOSIS — M62551 Muscle wasting and atrophy, not elsewhere classified, right thigh: Secondary | ICD-10-CM | POA: Diagnosis not present

## 2023-03-03 DIAGNOSIS — R488 Other symbolic dysfunctions: Secondary | ICD-10-CM | POA: Diagnosis not present

## 2023-03-03 DIAGNOSIS — M62562 Muscle wasting and atrophy, not elsewhere classified, left lower leg: Secondary | ICD-10-CM | POA: Diagnosis not present

## 2023-03-03 DIAGNOSIS — R2689 Other abnormalities of gait and mobility: Secondary | ICD-10-CM | POA: Diagnosis not present

## 2023-03-03 DIAGNOSIS — R2681 Unsteadiness on feet: Secondary | ICD-10-CM | POA: Diagnosis not present

## 2023-03-03 NOTE — Telephone Encounter (Signed)
Transition Care Management Unsuccessful Follow-up Telephone Call  [facility] of [facility] and from [name]  02/28/2023  Attempts:  2nd Attempt  Reason for unsuccessful TCM follow-up call:  No answer/busy

## 2023-03-04 DIAGNOSIS — M62551 Muscle wasting and atrophy, not elsewhere classified, right thigh: Secondary | ICD-10-CM | POA: Diagnosis not present

## 2023-03-04 DIAGNOSIS — M62552 Muscle wasting and atrophy, not elsewhere classified, left thigh: Secondary | ICD-10-CM | POA: Diagnosis not present

## 2023-03-04 DIAGNOSIS — M62561 Muscle wasting and atrophy, not elsewhere classified, right lower leg: Secondary | ICD-10-CM | POA: Diagnosis not present

## 2023-03-04 DIAGNOSIS — R2689 Other abnormalities of gait and mobility: Secondary | ICD-10-CM | POA: Diagnosis not present

## 2023-03-04 DIAGNOSIS — R488 Other symbolic dysfunctions: Secondary | ICD-10-CM | POA: Diagnosis not present

## 2023-03-04 DIAGNOSIS — M62562 Muscle wasting and atrophy, not elsewhere classified, left lower leg: Secondary | ICD-10-CM | POA: Diagnosis not present

## 2023-03-04 DIAGNOSIS — R2681 Unsteadiness on feet: Secondary | ICD-10-CM | POA: Diagnosis not present

## 2023-03-06 DIAGNOSIS — M62561 Muscle wasting and atrophy, not elsewhere classified, right lower leg: Secondary | ICD-10-CM | POA: Diagnosis not present

## 2023-03-06 DIAGNOSIS — R488 Other symbolic dysfunctions: Secondary | ICD-10-CM | POA: Diagnosis not present

## 2023-03-06 DIAGNOSIS — M62552 Muscle wasting and atrophy, not elsewhere classified, left thigh: Secondary | ICD-10-CM | POA: Diagnosis not present

## 2023-03-06 DIAGNOSIS — R2681 Unsteadiness on feet: Secondary | ICD-10-CM | POA: Diagnosis not present

## 2023-03-06 DIAGNOSIS — M62562 Muscle wasting and atrophy, not elsewhere classified, left lower leg: Secondary | ICD-10-CM | POA: Diagnosis not present

## 2023-03-06 DIAGNOSIS — M62551 Muscle wasting and atrophy, not elsewhere classified, right thigh: Secondary | ICD-10-CM | POA: Diagnosis not present

## 2023-03-06 DIAGNOSIS — R2689 Other abnormalities of gait and mobility: Secondary | ICD-10-CM | POA: Diagnosis not present

## 2023-03-08 DIAGNOSIS — M62551 Muscle wasting and atrophy, not elsewhere classified, right thigh: Secondary | ICD-10-CM | POA: Diagnosis not present

## 2023-03-08 DIAGNOSIS — R488 Other symbolic dysfunctions: Secondary | ICD-10-CM | POA: Diagnosis not present

## 2023-03-08 DIAGNOSIS — M62561 Muscle wasting and atrophy, not elsewhere classified, right lower leg: Secondary | ICD-10-CM | POA: Diagnosis not present

## 2023-03-08 DIAGNOSIS — M62562 Muscle wasting and atrophy, not elsewhere classified, left lower leg: Secondary | ICD-10-CM | POA: Diagnosis not present

## 2023-03-08 DIAGNOSIS — M62552 Muscle wasting and atrophy, not elsewhere classified, left thigh: Secondary | ICD-10-CM | POA: Diagnosis not present

## 2023-03-08 DIAGNOSIS — R2681 Unsteadiness on feet: Secondary | ICD-10-CM | POA: Diagnosis not present

## 2023-03-08 DIAGNOSIS — R2689 Other abnormalities of gait and mobility: Secondary | ICD-10-CM | POA: Diagnosis not present

## 2023-03-09 DIAGNOSIS — M62552 Muscle wasting and atrophy, not elsewhere classified, left thigh: Secondary | ICD-10-CM | POA: Diagnosis not present

## 2023-03-09 DIAGNOSIS — R2689 Other abnormalities of gait and mobility: Secondary | ICD-10-CM | POA: Diagnosis not present

## 2023-03-09 DIAGNOSIS — R488 Other symbolic dysfunctions: Secondary | ICD-10-CM | POA: Diagnosis not present

## 2023-03-09 DIAGNOSIS — M62561 Muscle wasting and atrophy, not elsewhere classified, right lower leg: Secondary | ICD-10-CM | POA: Diagnosis not present

## 2023-03-09 DIAGNOSIS — M62562 Muscle wasting and atrophy, not elsewhere classified, left lower leg: Secondary | ICD-10-CM | POA: Diagnosis not present

## 2023-03-09 DIAGNOSIS — M62551 Muscle wasting and atrophy, not elsewhere classified, right thigh: Secondary | ICD-10-CM | POA: Diagnosis not present

## 2023-03-10 DIAGNOSIS — M62562 Muscle wasting and atrophy, not elsewhere classified, left lower leg: Secondary | ICD-10-CM | POA: Diagnosis not present

## 2023-03-10 DIAGNOSIS — R2689 Other abnormalities of gait and mobility: Secondary | ICD-10-CM | POA: Diagnosis not present

## 2023-03-10 DIAGNOSIS — M62552 Muscle wasting and atrophy, not elsewhere classified, left thigh: Secondary | ICD-10-CM | POA: Diagnosis not present

## 2023-03-10 DIAGNOSIS — R488 Other symbolic dysfunctions: Secondary | ICD-10-CM | POA: Diagnosis not present

## 2023-03-10 DIAGNOSIS — M62561 Muscle wasting and atrophy, not elsewhere classified, right lower leg: Secondary | ICD-10-CM | POA: Diagnosis not present

## 2023-03-10 DIAGNOSIS — M62551 Muscle wasting and atrophy, not elsewhere classified, right thigh: Secondary | ICD-10-CM | POA: Diagnosis not present

## 2023-03-12 DIAGNOSIS — M62561 Muscle wasting and atrophy, not elsewhere classified, right lower leg: Secondary | ICD-10-CM | POA: Diagnosis not present

## 2023-03-12 DIAGNOSIS — R2689 Other abnormalities of gait and mobility: Secondary | ICD-10-CM | POA: Diagnosis not present

## 2023-03-12 DIAGNOSIS — M62552 Muscle wasting and atrophy, not elsewhere classified, left thigh: Secondary | ICD-10-CM | POA: Diagnosis not present

## 2023-03-12 DIAGNOSIS — M62562 Muscle wasting and atrophy, not elsewhere classified, left lower leg: Secondary | ICD-10-CM | POA: Diagnosis not present

## 2023-03-12 DIAGNOSIS — R488 Other symbolic dysfunctions: Secondary | ICD-10-CM | POA: Diagnosis not present

## 2023-03-12 DIAGNOSIS — M62551 Muscle wasting and atrophy, not elsewhere classified, right thigh: Secondary | ICD-10-CM | POA: Diagnosis not present

## 2023-03-13 ENCOUNTER — Other Ambulatory Visit (HOSPITAL_COMMUNITY): Admission: RE | Admit: 2023-03-13 | Payer: Medicare HMO | Source: Ambulatory Visit

## 2023-03-13 ENCOUNTER — Ambulatory Visit (INDEPENDENT_AMBULATORY_CARE_PROVIDER_SITE_OTHER): Payer: Medicare HMO | Admitting: Family

## 2023-03-13 VITALS — BP 111/74 | HR 73 | Temp 97.7°F | Ht 60.0 in | Wt 150.4 lb

## 2023-03-13 DIAGNOSIS — N3090 Cystitis, unspecified without hematuria: Secondary | ICD-10-CM

## 2023-03-13 DIAGNOSIS — B3731 Acute candidiasis of vulva and vagina: Secondary | ICD-10-CM | POA: Insufficient documentation

## 2023-03-13 MED ORDER — FLUCONAZOLE 150 MG PO TABS
ORAL_TABLET | ORAL | 0 refills | Status: DC
Start: 2023-03-13 — End: 2023-07-30

## 2023-03-13 NOTE — Progress Notes (Signed)
Patient ID: [name], female    DOB: Sep 27, 1935, 87 y.o.   MRN: [id number]  Chief Complaint  Patient presents with   Dysuria    Pt states when she finish abx, sx started again. Pt c/o dysuria.     HPI:      Urinary sx:  reports dysuria, no frequency or urgency as pt is incontinent, no foul odor. Reports having vaginal itching and irritation with wiping. Last UTI in June, not on preventative.  Assessment & Plan:  1. Vaginal yeast infection not able to urinate today, pt is incontinent. Believe yeast infection based on sx, will send out swab, but sending Diflucan, advised on use & SE. Advised pt on keeping vaginal area dry, changing brief often. Will call with swab results.  - Cervicovaginal ancillary only - fluconazole (DIFLUCAN) 150 MG tablet; Take 1 pill today and the 2nd pill in 3 days.  Dispense: 2 tablet; Refill: 0   Subjective:    Outpatient Medications Prior to Visit  Medication Sig Dispense Refill   brimonidine-timolol (COMBIGAN) 0.2-0.5 % ophthalmic solution Place 1 drop into both eyes every 12 (twelve) hours.     donepezil (ARICEPT) 5 MG tablet Take 1 tablet (5 mg total) by mouth at bedtime. 30 tablet 11   ezetimibe-simvastatin (VYTORIN) 10-20 MG tablet TAKE 1 TABLET BY MOUTH EVERY DAY 90 tablet 2   Multiple Vitamins-Minerals (MULTIVITAMIN ADULT) CHEW Chew 2 each by mouth daily. Nature Made for Her     nadolol (CORGARD) 20 MG tablet Take 1 tablet (20 mg total) by mouth at bedtime. 90 tablet 3   omeprazole (PRILOSEC) 20 MG capsule Take 20 mg by mouth daily. In the morning for acid reflux over the counter     traZODone (DESYREL) 100 MG tablet TAKE 1/4 TO 1/2 TABLET BY MOUTH AT BEDTIME IF NEEDED FOR SLEEP. 45 tablet 3   venlafaxine XR (EFFEXOR-XR) 150 MG 24 hr capsule Take 1 capsule (150 mg total) by mouth daily with breakfast. 90 capsule 3   No facility-administered medications prior to visit.   Past Medical History:  Diagnosis Date   Allergic rhinitis 10/19/2007    No rx      Aortic atherosclerosis (HCC) 01/17/2021   Also with coronary calcium.  CT scan from urology 01/05/2021 ordered by Dr. Earlene Plater of [facility]   Chest pain 01/24/2018   Chronic daily headache 07/08/2017   Colon polyps    Complication of anesthesia    takes longer to wake up- admitted overnight after colonoscopy   Essential hypertension 10/19/2007   Nadolol 40mg       Generalized anxiety disorder    GERD (gastroesophageal reflux disease) 10/19/2007   prilosec 20mg  otc. Recurs if off   Glaucoma    History of COVID-19 05/2021   mild case   History of skin cancer    basal cell on nose   History of UTI (urinary tract infection)    Hypercholesteremia    Hyperlipidemia 03/14/2009   Vytorin      Lichen simplex chronicus    Liver lesion 11/10/2017   Elective MRI advised for follow up liver cyst. Planned mid 2018 after recovers from surgery   Lumbar stenosis with neurogenic claudication    Macular degeneration of right eye 03/03/2013   diagonosed by Dr. Lenny Pastel   Major depressive disorder in remission (HCC) 10/22/2021   Mild cognitive impairment with memory loss 12/04/2022   Osteoarthritis    Osteopenia of neck of right femur 03/29/2019   -1.3 worst  t score 03/29/2019; also hip   PONV (postoperative nausea and vomiting)    Post-traumatic headache 10/11/2015   Saw neurology- plan was MRI but never ordered.    Scalp pain 10/22/2015   Stress incontinence 10/19/2007   2 surgeries- no improvement. Urology visits in past. May have one other surgery- holding off for now      Unsteady gait    Visual hallucinations    Wears glasses    Whooping cough    as a baby   Past Surgical History:  Procedure Laterality Date   ABDOMINAL HYSTERECTOMY  1977   ovaries remain   ANTERIOR LAT LUMBAR FUSION N/A 10/26/2017   Procedure: LUMBAR THREE- LUMBAR FOUR, LUMBAR FOUR- LUMBAR FIVE ANTEROLATERAL LUMBAR INTERBODY ARTHRODESIS;  Surgeon: Shirlean Kelly, MD;  Location: [facility] OR;  Service:  Neurosurgery;  Laterality: N/A;  LUMBAR 3- LUMBAR 4, LUMBAR 4- LUMBAR 5 ANTEROLATERAL LUMBAR INTERBODY ARTHRODESIS, LUMBAR 3- LUMBAR 4, LUMBAR 4- LUMBAR 5 PERCUTANEOUS PEDICLE SCREW FIXATION   APPENDECTOMY     APPLICATION OF WOUND VAC Left 04/27/2021   Procedure: APPLICATION OF INCISIONAL WOUND VAC;  Surgeon: Terance Hart, MD;  Location: [facility] OR;  Service: Orthopedics;  Laterality: Left;   APPLICATION OF WOUND VAC Left 07/01/2021   Procedure: APPLICATION OF WOUND VAC;  Surgeon: Allena Napoleon, MD;  Location: [facility] OR;  Service: Plastics;  Laterality: Left;   BLADDER REPAIR     x two   CHOLECYSTECTOMY     COLONOSCOPY     EYE SURGERY  11/14, 12/14   Macular Dengeneration, Glaucoma   EYE SURGERY  2016   left eye   I & D EXTREMITY Left 04/27/2021   Procedure: IRRIGATION AND DEBRIDEMENT LEFT LEG;  Surgeon: Terance Hart, MD;  Location: [facility] OR;  Service: Orthopedics;  Laterality: Left;   INCISION AND DRAINAGE OF WOUND Right 04/27/2021   Procedure: IRRIGATION AND DEBRIDEMENT WOUND;  Surgeon: Terance Hart, MD;  Location: [facility] OR;  Service: Orthopedics;  Laterality: Right;   LESION REMOVAL Right 06/28/2013   Procedure: EXCISION 3 cm right labial sebaceous cyst;  Surgeon: Annamaria Boots, MD;  Location: [facility] ORS;  Service: Gynecology;  Laterality: Right;   LUMBAR LAMINECTOMY/DECOMPRESSION MICRODISCECTOMY Right 03/20/2016   Procedure: Right - Lumbar four-five lumbar laminotomy, foraminotomy, and possible microdiscectomy;  Surgeon: Shirlean Kelly, MD;  Location: [facility];  Service: Neurosurgery;  Laterality: Right;  right   LUMBAR PERCUTANEOUS PEDICLE SCREW 2 LEVEL  10/26/2017   Procedure: LUMBAR THREE- LUMBAR FOUR, LUMBAR FOUR- LUMBAR FIVE PERCUTANEOUS PEDICLE SCREW FIXATION;  Surgeon: Shirlean Kelly, MD;  Location: [facility] OR;  Service: Neurosurgery;;   MOHS SURGERY     procedure to remove basal cell   SKIN SPLIT GRAFT Left 07/01/2021   Procedure: SKIN GRAFT SPLIT THICKNESS;  Surgeon:  Allena Napoleon, MD;  Location: [facility] OR;  Service: Plastics;  Laterality: Left;  1 hour   TONSILLECTOMY AND ADENOIDECTOMY     URETHRAL SLING  2007   URETHRAL SLING  1/12   midurethral    vertebroplasty secondary to traumatic compression fracture     Allergies  Allergen Reactions   Citrate Of Magnesia Other (See Comments)    confusion   Codeine Nausea And Vomiting   Shingrix [Zoster Vac Recomb Adjuvanted] Swelling and Rash    Rash and temp x several days. (low grade 100 -101 )       Objective:    Physical Exam Vitals and nursing note reviewed.  Constitutional:  Appearance: Normal appearance.  Cardiovascular:     Rate and Rhythm: Normal rate and regular rhythm.  Pulmonary:     Effort: Pulmonary effort is normal.     Breath sounds: Normal breath sounds.  Musculoskeletal:        General: Normal range of motion.  Skin:    General: Skin is warm and dry.  Neurological:     Mental Status: She is alert.     Gait: Gait abnormal (using walker).  Psychiatric:        Mood and Affect: Mood normal.        Behavior: Behavior normal.    BP 111/74   Pulse 73   Temp 97.7 F (36.5 C) (Temporal)   Ht 5' (1.524 m)   Wt 150 lb 6.4 oz (68.2 kg)   LMP 08/11/1974   SpO2 97%   BMI 29.37 kg/m  Wt Readings from Last 3 Encounters:  03/13/23 150 lb 6.4 oz (68.2 kg)  02/28/23 149 lb 14.6 oz (68 kg)  02/03/23 151 lb (68.5 kg)      Dulce Sellar, NP

## 2023-03-16 DIAGNOSIS — R2689 Other abnormalities of gait and mobility: Secondary | ICD-10-CM | POA: Diagnosis not present

## 2023-03-16 DIAGNOSIS — R488 Other symbolic dysfunctions: Secondary | ICD-10-CM | POA: Diagnosis not present

## 2023-03-16 DIAGNOSIS — M62551 Muscle wasting and atrophy, not elsewhere classified, right thigh: Secondary | ICD-10-CM | POA: Diagnosis not present

## 2023-03-16 DIAGNOSIS — M62552 Muscle wasting and atrophy, not elsewhere classified, left thigh: Secondary | ICD-10-CM | POA: Diagnosis not present

## 2023-03-16 DIAGNOSIS — M62561 Muscle wasting and atrophy, not elsewhere classified, right lower leg: Secondary | ICD-10-CM | POA: Diagnosis not present

## 2023-03-16 DIAGNOSIS — M62562 Muscle wasting and atrophy, not elsewhere classified, left lower leg: Secondary | ICD-10-CM | POA: Diagnosis not present

## 2023-03-16 NOTE — Progress Notes (Signed)
Vaginal swab negative. Ask how she is doing? Still having the dysuria? any other symptoms? Let me know, thx

## 2023-03-17 DIAGNOSIS — M62561 Muscle wasting and atrophy, not elsewhere classified, right lower leg: Secondary | ICD-10-CM | POA: Diagnosis not present

## 2023-03-17 DIAGNOSIS — M62552 Muscle wasting and atrophy, not elsewhere classified, left thigh: Secondary | ICD-10-CM | POA: Diagnosis not present

## 2023-03-17 DIAGNOSIS — M62562 Muscle wasting and atrophy, not elsewhere classified, left lower leg: Secondary | ICD-10-CM | POA: Diagnosis not present

## 2023-03-17 DIAGNOSIS — R488 Other symbolic dysfunctions: Secondary | ICD-10-CM | POA: Diagnosis not present

## 2023-03-17 DIAGNOSIS — R2689 Other abnormalities of gait and mobility: Secondary | ICD-10-CM | POA: Diagnosis not present

## 2023-03-17 DIAGNOSIS — M62551 Muscle wasting and atrophy, not elsewhere classified, right thigh: Secondary | ICD-10-CM | POA: Diagnosis not present

## 2023-03-17 MED ORDER — CEPHALEXIN 500 MG PO CAPS
500.0000 mg | ORAL_CAPSULE | Freq: Three times a day (TID) | ORAL | 0 refills | Status: AC
Start: 2023-03-17 — End: 2023-03-24

## 2023-03-17 NOTE — Progress Notes (Signed)
sending in abt due to UA results - please let her know. It is Cephalexin, same one she took last time.

## 2023-03-17 NOTE — Addendum Note (Signed)
Addended byDulce Sellar on: 03/17/2023 10:11 PM   Modules accepted: Orders

## 2023-03-19 DIAGNOSIS — R2689 Other abnormalities of gait and mobility: Secondary | ICD-10-CM | POA: Diagnosis not present

## 2023-03-19 DIAGNOSIS — M62561 Muscle wasting and atrophy, not elsewhere classified, right lower leg: Secondary | ICD-10-CM | POA: Diagnosis not present

## 2023-03-19 DIAGNOSIS — M62551 Muscle wasting and atrophy, not elsewhere classified, right thigh: Secondary | ICD-10-CM | POA: Diagnosis not present

## 2023-03-19 DIAGNOSIS — R488 Other symbolic dysfunctions: Secondary | ICD-10-CM | POA: Diagnosis not present

## 2023-03-19 DIAGNOSIS — M62552 Muscle wasting and atrophy, not elsewhere classified, left thigh: Secondary | ICD-10-CM | POA: Diagnosis not present

## 2023-03-19 DIAGNOSIS — M62562 Muscle wasting and atrophy, not elsewhere classified, left lower leg: Secondary | ICD-10-CM | POA: Diagnosis not present

## 2023-03-20 DIAGNOSIS — M62562 Muscle wasting and atrophy, not elsewhere classified, left lower leg: Secondary | ICD-10-CM | POA: Diagnosis not present

## 2023-03-20 DIAGNOSIS — R488 Other symbolic dysfunctions: Secondary | ICD-10-CM | POA: Diagnosis not present

## 2023-03-20 DIAGNOSIS — R2689 Other abnormalities of gait and mobility: Secondary | ICD-10-CM | POA: Diagnosis not present

## 2023-03-20 DIAGNOSIS — M62561 Muscle wasting and atrophy, not elsewhere classified, right lower leg: Secondary | ICD-10-CM | POA: Diagnosis not present

## 2023-03-20 DIAGNOSIS — M62551 Muscle wasting and atrophy, not elsewhere classified, right thigh: Secondary | ICD-10-CM | POA: Diagnosis not present

## 2023-03-20 DIAGNOSIS — M62552 Muscle wasting and atrophy, not elsewhere classified, left thigh: Secondary | ICD-10-CM | POA: Diagnosis not present

## 2023-03-22 DIAGNOSIS — R488 Other symbolic dysfunctions: Secondary | ICD-10-CM | POA: Diagnosis not present

## 2023-03-22 DIAGNOSIS — R2689 Other abnormalities of gait and mobility: Secondary | ICD-10-CM | POA: Diagnosis not present

## 2023-03-22 DIAGNOSIS — M62551 Muscle wasting and atrophy, not elsewhere classified, right thigh: Secondary | ICD-10-CM | POA: Diagnosis not present

## 2023-03-22 DIAGNOSIS — M62552 Muscle wasting and atrophy, not elsewhere classified, left thigh: Secondary | ICD-10-CM | POA: Diagnosis not present

## 2023-03-22 DIAGNOSIS — M62562 Muscle wasting and atrophy, not elsewhere classified, left lower leg: Secondary | ICD-10-CM | POA: Diagnosis not present

## 2023-03-22 DIAGNOSIS — M62561 Muscle wasting and atrophy, not elsewhere classified, right lower leg: Secondary | ICD-10-CM | POA: Diagnosis not present

## 2023-03-23 ENCOUNTER — Other Ambulatory Visit: Payer: Self-pay | Admitting: Family Medicine

## 2023-03-23 DIAGNOSIS — M62562 Muscle wasting and atrophy, not elsewhere classified, left lower leg: Secondary | ICD-10-CM | POA: Diagnosis not present

## 2023-03-23 DIAGNOSIS — M62552 Muscle wasting and atrophy, not elsewhere classified, left thigh: Secondary | ICD-10-CM | POA: Diagnosis not present

## 2023-03-23 DIAGNOSIS — M62561 Muscle wasting and atrophy, not elsewhere classified, right lower leg: Secondary | ICD-10-CM | POA: Diagnosis not present

## 2023-03-23 DIAGNOSIS — R2689 Other abnormalities of gait and mobility: Secondary | ICD-10-CM | POA: Diagnosis not present

## 2023-03-23 DIAGNOSIS — M62551 Muscle wasting and atrophy, not elsewhere classified, right thigh: Secondary | ICD-10-CM | POA: Diagnosis not present

## 2023-03-23 DIAGNOSIS — R488 Other symbolic dysfunctions: Secondary | ICD-10-CM | POA: Diagnosis not present

## 2023-03-24 DIAGNOSIS — M62551 Muscle wasting and atrophy, not elsewhere classified, right thigh: Secondary | ICD-10-CM | POA: Diagnosis not present

## 2023-03-24 DIAGNOSIS — M62561 Muscle wasting and atrophy, not elsewhere classified, right lower leg: Secondary | ICD-10-CM | POA: Diagnosis not present

## 2023-03-24 DIAGNOSIS — R488 Other symbolic dysfunctions: Secondary | ICD-10-CM | POA: Diagnosis not present

## 2023-03-24 DIAGNOSIS — R2689 Other abnormalities of gait and mobility: Secondary | ICD-10-CM | POA: Diagnosis not present

## 2023-03-24 DIAGNOSIS — M62562 Muscle wasting and atrophy, not elsewhere classified, left lower leg: Secondary | ICD-10-CM | POA: Diagnosis not present

## 2023-03-24 DIAGNOSIS — M62552 Muscle wasting and atrophy, not elsewhere classified, left thigh: Secondary | ICD-10-CM | POA: Diagnosis not present

## 2023-03-26 DIAGNOSIS — M62551 Muscle wasting and atrophy, not elsewhere classified, right thigh: Secondary | ICD-10-CM | POA: Diagnosis not present

## 2023-03-26 DIAGNOSIS — M62562 Muscle wasting and atrophy, not elsewhere classified, left lower leg: Secondary | ICD-10-CM | POA: Diagnosis not present

## 2023-03-26 DIAGNOSIS — R2689 Other abnormalities of gait and mobility: Secondary | ICD-10-CM | POA: Diagnosis not present

## 2023-03-26 DIAGNOSIS — M62561 Muscle wasting and atrophy, not elsewhere classified, right lower leg: Secondary | ICD-10-CM | POA: Diagnosis not present

## 2023-03-26 DIAGNOSIS — M62552 Muscle wasting and atrophy, not elsewhere classified, left thigh: Secondary | ICD-10-CM | POA: Diagnosis not present

## 2023-03-26 DIAGNOSIS — R488 Other symbolic dysfunctions: Secondary | ICD-10-CM | POA: Diagnosis not present

## 2023-04-01 DIAGNOSIS — M62561 Muscle wasting and atrophy, not elsewhere classified, right lower leg: Secondary | ICD-10-CM | POA: Diagnosis not present

## 2023-04-01 DIAGNOSIS — R2689 Other abnormalities of gait and mobility: Secondary | ICD-10-CM | POA: Diagnosis not present

## 2023-04-01 DIAGNOSIS — M62552 Muscle wasting and atrophy, not elsewhere classified, left thigh: Secondary | ICD-10-CM | POA: Diagnosis not present

## 2023-04-01 DIAGNOSIS — M62551 Muscle wasting and atrophy, not elsewhere classified, right thigh: Secondary | ICD-10-CM | POA: Diagnosis not present

## 2023-04-01 DIAGNOSIS — M62562 Muscle wasting and atrophy, not elsewhere classified, left lower leg: Secondary | ICD-10-CM | POA: Diagnosis not present

## 2023-04-01 DIAGNOSIS — R488 Other symbolic dysfunctions: Secondary | ICD-10-CM | POA: Diagnosis not present

## 2023-04-02 DIAGNOSIS — M62561 Muscle wasting and atrophy, not elsewhere classified, right lower leg: Secondary | ICD-10-CM | POA: Diagnosis not present

## 2023-04-02 DIAGNOSIS — M62562 Muscle wasting and atrophy, not elsewhere classified, left lower leg: Secondary | ICD-10-CM | POA: Diagnosis not present

## 2023-04-02 DIAGNOSIS — M62551 Muscle wasting and atrophy, not elsewhere classified, right thigh: Secondary | ICD-10-CM | POA: Diagnosis not present

## 2023-04-02 DIAGNOSIS — M62552 Muscle wasting and atrophy, not elsewhere classified, left thigh: Secondary | ICD-10-CM | POA: Diagnosis not present

## 2023-04-02 DIAGNOSIS — R2689 Other abnormalities of gait and mobility: Secondary | ICD-10-CM | POA: Diagnosis not present

## 2023-04-02 DIAGNOSIS — R488 Other symbolic dysfunctions: Secondary | ICD-10-CM | POA: Diagnosis not present

## 2023-04-03 DIAGNOSIS — R488 Other symbolic dysfunctions: Secondary | ICD-10-CM | POA: Diagnosis not present

## 2023-04-03 DIAGNOSIS — M62552 Muscle wasting and atrophy, not elsewhere classified, left thigh: Secondary | ICD-10-CM | POA: Diagnosis not present

## 2023-04-03 DIAGNOSIS — M62562 Muscle wasting and atrophy, not elsewhere classified, left lower leg: Secondary | ICD-10-CM | POA: Diagnosis not present

## 2023-04-03 DIAGNOSIS — M62561 Muscle wasting and atrophy, not elsewhere classified, right lower leg: Secondary | ICD-10-CM | POA: Diagnosis not present

## 2023-04-03 DIAGNOSIS — M62551 Muscle wasting and atrophy, not elsewhere classified, right thigh: Secondary | ICD-10-CM | POA: Diagnosis not present

## 2023-04-03 DIAGNOSIS — R2689 Other abnormalities of gait and mobility: Secondary | ICD-10-CM | POA: Diagnosis not present

## 2023-04-06 DIAGNOSIS — R488 Other symbolic dysfunctions: Secondary | ICD-10-CM | POA: Diagnosis not present

## 2023-04-06 DIAGNOSIS — M62561 Muscle wasting and atrophy, not elsewhere classified, right lower leg: Secondary | ICD-10-CM | POA: Diagnosis not present

## 2023-04-06 DIAGNOSIS — M62562 Muscle wasting and atrophy, not elsewhere classified, left lower leg: Secondary | ICD-10-CM | POA: Diagnosis not present

## 2023-04-06 DIAGNOSIS — R2689 Other abnormalities of gait and mobility: Secondary | ICD-10-CM | POA: Diagnosis not present

## 2023-04-06 DIAGNOSIS — M62551 Muscle wasting and atrophy, not elsewhere classified, right thigh: Secondary | ICD-10-CM | POA: Diagnosis not present

## 2023-04-06 DIAGNOSIS — M62552 Muscle wasting and atrophy, not elsewhere classified, left thigh: Secondary | ICD-10-CM | POA: Diagnosis not present

## 2023-04-07 DIAGNOSIS — M62562 Muscle wasting and atrophy, not elsewhere classified, left lower leg: Secondary | ICD-10-CM | POA: Diagnosis not present

## 2023-04-07 DIAGNOSIS — M62552 Muscle wasting and atrophy, not elsewhere classified, left thigh: Secondary | ICD-10-CM | POA: Diagnosis not present

## 2023-04-07 DIAGNOSIS — R2689 Other abnormalities of gait and mobility: Secondary | ICD-10-CM | POA: Diagnosis not present

## 2023-04-07 DIAGNOSIS — M62551 Muscle wasting and atrophy, not elsewhere classified, right thigh: Secondary | ICD-10-CM | POA: Diagnosis not present

## 2023-04-07 DIAGNOSIS — R488 Other symbolic dysfunctions: Secondary | ICD-10-CM | POA: Diagnosis not present

## 2023-04-07 DIAGNOSIS — M62561 Muscle wasting and atrophy, not elsewhere classified, right lower leg: Secondary | ICD-10-CM | POA: Diagnosis not present

## 2023-04-09 DIAGNOSIS — N3946 Mixed incontinence: Secondary | ICD-10-CM | POA: Diagnosis not present

## 2023-04-09 DIAGNOSIS — R8271 Bacteriuria: Secondary | ICD-10-CM | POA: Diagnosis not present

## 2023-04-09 DIAGNOSIS — N3021 Other chronic cystitis with hematuria: Secondary | ICD-10-CM | POA: Diagnosis not present

## 2023-04-11 ENCOUNTER — Other Ambulatory Visit: Payer: Self-pay | Admitting: Family

## 2023-04-11 DIAGNOSIS — N390 Urinary tract infection, site not specified: Secondary | ICD-10-CM

## 2023-04-12 DIAGNOSIS — R488 Other symbolic dysfunctions: Secondary | ICD-10-CM | POA: Diagnosis not present

## 2023-04-15 DIAGNOSIS — R488 Other symbolic dysfunctions: Secondary | ICD-10-CM | POA: Diagnosis not present

## 2023-04-16 DIAGNOSIS — R488 Other symbolic dysfunctions: Secondary | ICD-10-CM | POA: Diagnosis not present

## 2023-04-21 ENCOUNTER — Other Ambulatory Visit: Payer: Self-pay | Admitting: Family Medicine

## 2023-04-21 ENCOUNTER — Other Ambulatory Visit: Payer: Self-pay | Admitting: Family

## 2023-04-21 DIAGNOSIS — N390 Urinary tract infection, site not specified: Secondary | ICD-10-CM

## 2023-04-21 DIAGNOSIS — R488 Other symbolic dysfunctions: Secondary | ICD-10-CM | POA: Diagnosis not present

## 2023-04-22 ENCOUNTER — Other Ambulatory Visit: Payer: Self-pay | Admitting: Family

## 2023-04-22 DIAGNOSIS — N390 Urinary tract infection, site not specified: Secondary | ICD-10-CM

## 2023-04-24 ENCOUNTER — Telehealth: Payer: Self-pay | Admitting: Family Medicine

## 2023-04-24 ENCOUNTER — Other Ambulatory Visit: Payer: Self-pay | Admitting: Family Medicine

## 2023-04-24 DIAGNOSIS — Z961 Presence of intraocular lens: Secondary | ICD-10-CM | POA: Diagnosis not present

## 2023-04-24 DIAGNOSIS — H401123 Primary open-angle glaucoma, left eye, severe stage: Secondary | ICD-10-CM | POA: Diagnosis not present

## 2023-04-24 DIAGNOSIS — H35371 Puckering of macula, right eye: Secondary | ICD-10-CM | POA: Diagnosis not present

## 2023-04-24 DIAGNOSIS — H401112 Primary open-angle glaucoma, right eye, moderate stage: Secondary | ICD-10-CM | POA: Diagnosis not present

## 2023-04-24 DIAGNOSIS — N390 Urinary tract infection, site not specified: Secondary | ICD-10-CM

## 2023-04-24 NOTE — Telephone Encounter (Signed)
Please place pt on cancellation list.

## 2023-04-24 NOTE — Telephone Encounter (Signed)
Patient called requesting to have a conversation with Dr. Durene Cal. I informed her that next OV is December and patient declined for now.

## 2023-04-28 ENCOUNTER — Encounter: Payer: Medicare HMO | Admitting: Pharmacist

## 2023-04-29 DIAGNOSIS — R488 Other symbolic dysfunctions: Secondary | ICD-10-CM | POA: Diagnosis not present

## 2023-05-01 DIAGNOSIS — R488 Other symbolic dysfunctions: Secondary | ICD-10-CM | POA: Diagnosis not present

## 2023-05-05 DIAGNOSIS — R488 Other symbolic dysfunctions: Secondary | ICD-10-CM | POA: Diagnosis not present

## 2023-05-08 DIAGNOSIS — R488 Other symbolic dysfunctions: Secondary | ICD-10-CM | POA: Diagnosis not present

## 2023-05-12 DIAGNOSIS — R488 Other symbolic dysfunctions: Secondary | ICD-10-CM | POA: Diagnosis not present

## 2023-05-15 DIAGNOSIS — R488 Other symbolic dysfunctions: Secondary | ICD-10-CM | POA: Diagnosis not present

## 2023-05-17 DIAGNOSIS — R488 Other symbolic dysfunctions: Secondary | ICD-10-CM | POA: Diagnosis not present

## 2023-05-19 DIAGNOSIS — H409 Unspecified glaucoma: Secondary | ICD-10-CM | POA: Diagnosis not present

## 2023-05-19 DIAGNOSIS — Z87440 Personal history of urinary (tract) infections: Secondary | ICD-10-CM | POA: Diagnosis not present

## 2023-05-19 DIAGNOSIS — I10 Essential (primary) hypertension: Secondary | ICD-10-CM | POA: Diagnosis not present

## 2023-05-19 DIAGNOSIS — Z8249 Family history of ischemic heart disease and other diseases of the circulatory system: Secondary | ICD-10-CM | POA: Diagnosis not present

## 2023-05-19 DIAGNOSIS — R32 Unspecified urinary incontinence: Secondary | ICD-10-CM | POA: Diagnosis not present

## 2023-05-19 DIAGNOSIS — E785 Hyperlipidemia, unspecified: Secondary | ICD-10-CM | POA: Diagnosis not present

## 2023-05-19 DIAGNOSIS — K219 Gastro-esophageal reflux disease without esophagitis: Secondary | ICD-10-CM | POA: Diagnosis not present

## 2023-05-19 DIAGNOSIS — R2681 Unsteadiness on feet: Secondary | ICD-10-CM | POA: Diagnosis not present

## 2023-05-19 DIAGNOSIS — F419 Anxiety disorder, unspecified: Secondary | ICD-10-CM | POA: Diagnosis not present

## 2023-05-19 DIAGNOSIS — Z87891 Personal history of nicotine dependence: Secondary | ICD-10-CM | POA: Diagnosis not present

## 2023-05-19 DIAGNOSIS — R488 Other symbolic dysfunctions: Secondary | ICD-10-CM | POA: Diagnosis not present

## 2023-05-19 DIAGNOSIS — Z9181 History of falling: Secondary | ICD-10-CM | POA: Diagnosis not present

## 2023-05-19 DIAGNOSIS — F324 Major depressive disorder, single episode, in partial remission: Secondary | ICD-10-CM | POA: Diagnosis not present

## 2023-05-27 DIAGNOSIS — N3021 Other chronic cystitis with hematuria: Secondary | ICD-10-CM | POA: Diagnosis not present

## 2023-05-27 DIAGNOSIS — R8289 Other abnormal findings on cytological and histological examination of urine: Secondary | ICD-10-CM | POA: Diagnosis not present

## 2023-05-27 DIAGNOSIS — R102 Pelvic and perineal pain: Secondary | ICD-10-CM | POA: Diagnosis not present

## 2023-05-28 DIAGNOSIS — R488 Other symbolic dysfunctions: Secondary | ICD-10-CM | POA: Diagnosis not present

## 2023-05-29 DIAGNOSIS — R488 Other symbolic dysfunctions: Secondary | ICD-10-CM | POA: Diagnosis not present

## 2023-05-31 DIAGNOSIS — R488 Other symbolic dysfunctions: Secondary | ICD-10-CM | POA: Diagnosis not present

## 2023-06-04 DIAGNOSIS — R488 Other symbolic dysfunctions: Secondary | ICD-10-CM | POA: Diagnosis not present

## 2023-06-09 DIAGNOSIS — R488 Other symbolic dysfunctions: Secondary | ICD-10-CM | POA: Diagnosis not present

## 2023-06-10 ENCOUNTER — Telehealth: Payer: Self-pay | Admitting: Family Medicine

## 2023-06-10 NOTE — Telephone Encounter (Signed)
Patient dropped off document Handicap Placard, to be filled out by provider. Patient requested to send it back via Call Patient to pick up within ASAP. Document is located in providers tray at front office.Please advise at Mobile [phone] (mobile)

## 2023-06-11 NOTE — Telephone Encounter (Signed)
Called and made pt aware that placard form is available and ready for pickup up front.

## 2023-06-12 DIAGNOSIS — R488 Other symbolic dysfunctions: Secondary | ICD-10-CM | POA: Diagnosis not present

## 2023-06-15 DIAGNOSIS — R488 Other symbolic dysfunctions: Secondary | ICD-10-CM | POA: Diagnosis not present

## 2023-06-17 ENCOUNTER — Telehealth: Payer: Self-pay | Admitting: Physician Assistant

## 2023-06-17 DIAGNOSIS — R488 Other symbolic dysfunctions: Secondary | ICD-10-CM | POA: Diagnosis not present

## 2023-06-17 NOTE — Telephone Encounter (Signed)
Patient left a message about wanting to make appt I tried to call her but got no answer and no VM

## 2023-06-19 ENCOUNTER — Institutional Professional Consult (permissible substitution): Payer: Medicare HMO | Admitting: Psychology

## 2023-06-19 ENCOUNTER — Ambulatory Visit: Payer: Self-pay

## 2023-06-25 ENCOUNTER — Telehealth: Payer: Self-pay | Admitting: Family Medicine

## 2023-06-25 DIAGNOSIS — R488 Other symbolic dysfunctions: Secondary | ICD-10-CM | POA: Diagnosis not present

## 2023-06-25 NOTE — Telephone Encounter (Signed)
My understanding is that she should be on venlafaxine 150 mg extended release once daily in the morning-if that is how she is currently taking it please have her continue

## 2023-06-25 NOTE — Telephone Encounter (Signed)
Patient called requesting clarification on how she should be taking venlafaxine XR. States she has always known it to be once a day but the pharmacy recently told her it is BID. Next OV with PCP available on 12/30; patient's next OV with PCP is 12/19.

## 2023-06-25 NOTE — Telephone Encounter (Signed)
In Note from last CPE: is this how you want her to continue take the Venlafaxine?   # Depression S: Medication:Venlafaxine 150 mg twice daily in the past now on 150 in the morning and 75 mg in the evening, trazodone 100 mg -denies anhedonia or depressed mood A/P: depression  is doing well- wants to try to cut out evening dose of venlafaxine 75 mg- she will continue the 150 mg XR in the morning

## 2023-06-26 ENCOUNTER — Telehealth: Payer: Self-pay | Admitting: Physician Assistant

## 2023-06-26 ENCOUNTER — Encounter: Payer: Medicare HMO | Admitting: Psychology

## 2023-06-26 DIAGNOSIS — R488 Other symbolic dysfunctions: Secondary | ICD-10-CM | POA: Diagnosis not present

## 2023-06-26 NOTE — Telephone Encounter (Signed)
Error

## 2023-06-29 NOTE — Telephone Encounter (Signed)
Pt called again, please call them back about below message.

## 2023-06-29 NOTE — Telephone Encounter (Signed)
Reached out to pt and phone just rang. Will try again later.

## 2023-07-01 DIAGNOSIS — R488 Other symbolic dysfunctions: Secondary | ICD-10-CM | POA: Diagnosis not present

## 2023-07-02 DIAGNOSIS — R488 Other symbolic dysfunctions: Secondary | ICD-10-CM | POA: Diagnosis not present

## 2023-07-03 ENCOUNTER — Other Ambulatory Visit: Payer: Self-pay | Admitting: Family Medicine

## 2023-07-03 DIAGNOSIS — N390 Urinary tract infection, site not specified: Secondary | ICD-10-CM

## 2023-07-03 NOTE — Telephone Encounter (Signed)
Called and spoke with pt and message given.

## 2023-07-05 DIAGNOSIS — R488 Other symbolic dysfunctions: Secondary | ICD-10-CM | POA: Diagnosis not present

## 2023-07-06 DIAGNOSIS — R488 Other symbolic dysfunctions: Secondary | ICD-10-CM | POA: Diagnosis not present

## 2023-07-13 DIAGNOSIS — R488 Other symbolic dysfunctions: Secondary | ICD-10-CM | POA: Diagnosis not present

## 2023-07-15 DIAGNOSIS — R488 Other symbolic dysfunctions: Secondary | ICD-10-CM | POA: Diagnosis not present

## 2023-07-22 ENCOUNTER — Ambulatory Visit: Payer: Medicare HMO | Admitting: Physician Assistant

## 2023-07-22 DIAGNOSIS — R488 Other symbolic dysfunctions: Secondary | ICD-10-CM | POA: Diagnosis not present

## 2023-07-24 DIAGNOSIS — R488 Other symbolic dysfunctions: Secondary | ICD-10-CM | POA: Diagnosis not present

## 2023-07-28 DIAGNOSIS — R488 Other symbolic dysfunctions: Secondary | ICD-10-CM | POA: Diagnosis not present

## 2023-07-30 ENCOUNTER — Encounter: Payer: Self-pay | Admitting: Family Medicine

## 2023-07-30 ENCOUNTER — Ambulatory Visit: Payer: Medicare HMO | Admitting: Family Medicine

## 2023-07-30 VITALS — BP 122/60 | HR 68 | Temp 97.8°F | Ht 60.0 in | Wt 144.8 lb

## 2023-07-30 DIAGNOSIS — I700 Atherosclerosis of aorta: Secondary | ICD-10-CM | POA: Diagnosis not present

## 2023-07-30 DIAGNOSIS — F3340 Major depressive disorder, recurrent, in remission, unspecified: Secondary | ICD-10-CM

## 2023-07-30 DIAGNOSIS — I10 Essential (primary) hypertension: Secondary | ICD-10-CM | POA: Diagnosis not present

## 2023-07-30 DIAGNOSIS — R441 Visual hallucinations: Secondary | ICD-10-CM

## 2023-07-30 DIAGNOSIS — E785 Hyperlipidemia, unspecified: Secondary | ICD-10-CM | POA: Diagnosis not present

## 2023-07-30 NOTE — Progress Notes (Signed)
Phone [phone] In person visit   Subjective:   [name] is a 87 y.o. year old very pleasant female patient who presents for/with See problem oriented charting Chief Complaint  Patient presents with   Medical Management of Chronic Issues   Depression   Past Medical History-  Patient Active Problem List   Diagnosis Date Noted   Mild cognitive impairment with memory loss 12/04/2022    Priority: High   Visual hallucinations     Priority: High   Generalized anxiety disorder 12/03/2022    Priority: Medium    Major depressive disorder in remission (HCC) 10/22/2021    Priority: Medium    Aortic atherosclerosis (HCC) 01/17/2021    Priority: Medium    Liver lesion 11/10/2017    Priority: Medium    Lumbar stenosis with neurogenic claudication 03/20/2016    Priority: Medium    Macular degeneration of right eye 03/03/2013    Priority: Medium    Glaucoma 03/03/2013    Priority: Medium    Hyperlipidemia 03/14/2009    Priority: Medium    Essential hypertension 10/19/2007    Priority: Medium    GERD (gastroesophageal reflux disease) 10/19/2007    Priority: Medium    Chest pain 01/24/2018    Priority: Low   Chronic daily headache 07/08/2017    Priority: Low   Post-traumatic headache 10/11/2015    Priority: Low   Osteoarthritis 10/19/2007    Priority: Low   Stress incontinence 10/19/2007    Priority: Low   Allergic rhinitis 10/19/2007    Priority: Low   Osteopenia of neck of right femur 03/29/2019    Medications- reviewed and updated Current Outpatient Medications  Medication Sig Dispense Refill   COMBIGAN 0.2-0.5 % ophthalmic solution instill 1 drop into both eyes twice a day. 5 mL 3   donepezil (ARICEPT) 5 MG tablet Take 1 tablet (5 mg total) by mouth at bedtime. 30 tablet 11   ezetimibe-simvastatin (VYTORIN) 10-20 MG tablet TAKE ONE TABLET BY MOUTH EVERY DAY 90 tablet 1   Multiple Vitamins-Minerals (MULTIVITAMIN ADULT) CHEW Chew 2 each by mouth daily. Nature  Made for Her     nadolol (CORGARD) 20 MG tablet Take 1 tablet (20 mg total) by mouth at bedtime. 90 tablet 3   nitrofurantoin (MACRODANTIN) 50 MG capsule TAKE ONE CAPSULE ONCE DAILY 90 capsule 0   omeprazole (PRILOSEC) 20 MG capsule Take 20 mg by mouth daily. In the morning for acid reflux over the counter     traZODone (DESYREL) 100 MG tablet TAKE 1/4 TO 1/2 TABLET BY MOUTH AT BEDTIME IF NEEDED FOR SLEEP. 45 tablet 3   venlafaxine XR (EFFEXOR-XR) 150 MG 24 hr capsule Take 1 capsule (150 mg total) by mouth daily with breakfast. 90 capsule 3   No current facility-administered medications for this visit.     Objective:  BP 122/60   Pulse 68   Temp 97.8 F (36.6 C)   Ht 5' (1.524 m)   Wt 144 lb 12.8 oz (65.7 kg)   LMP 08/11/1974   SpO2 99%   BMI 28.28 kg/m  Gen: NAD, resting comfortably CV: RRR no murmurs rubs or gallops Lungs: CTAB no crackles, wheeze, rhonchi.  Ext: no edema Skin: warm, dry Neuro:  walks with walker    Assessment and Plan   # Early Alzheimer's per Dr. Milbert Coulter on 01/15/2023 S: Medication: donepezil 5 mg daily -Patient has dealt with hallucinations for years-mainly at night but keeping the blinds closed helps A/P: memory overall stable-  continue current medications  -with hallucinations - mentioned seroquel or depakote but she's worried about side effects   # Depression S: Medication: Venlafaxine 150 mg extended release in the morning, trazodone 100 mg daily- takes only a 4th     07/30/2023    1:21 PM 11/06/2022    9:38 AM 09/26/2022    1:33 PM  Depression screen PHQ 2/9  Decreased Interest 0 0 0  Down, Depressed, Hopeless 0 0 0  PHQ - 2 Score 0 0 0  Altered sleeping 0 0 0  Tired, decreased energy 0 0 1  Change in appetite 0 0 0  Feeling bad or failure about yourself  0 0 0  Trouble concentrating 0 0 3  Moving slowly or fidgety/restless 0 0 0  Suicidal thoughts 0 0 0  PHQ-9 Score 0 0 4  Difficult doing work/chores Not difficult at all Not difficult at  all Somewhat difficult  A/P: Depression scores looked good today/full remission- continue current medications    #hypertension S: medication: Nadolol 20 mg daily A/P: stable- continue current medicines   #hyperlipidemia # Aortic atherosclerosis S: Medication:Vytorin 10-20 mg daily Lab Results  Component Value Date   CHOL 154 01/27/2023   HDL 45.50 01/27/2023   LDLCALC 69 01/27/2023   LDLDIRECT 84.0 03/06/2017   TRIG 196.0 (H) 01/27/2023   CHOLHDL 3 01/27/2023   A/P: goodcontrol last visit even for aortic atherosclerosis - continue current medications and recheck next visit    # B12 deficiency S: Current treatment/medication (oral vs. IM): 1000 mcg daily A/P: stable- continue current medicines - check next vsiit   # Glaucoma-follows with ophthalmology-encouraged follow-up- asked her to schedule to have them refill- appears we filled once  -also has macular degeneration in opposite eye   # Recurrent UTI-on long-term nitrofurantoin for suppression  -vaginal dryness- was started on estradiol per urology- she did not know she had refills but 3 listed- she can call for this from Dr. Arita Miss  Recommended follow up: Return in about 6 months (around 01/28/2024) for physical or sooner if needed.Schedule b4 you leave. Future Appointments  Date Time Provider Department Center  08/17/2023  9:00 AM Marcos Eke, PA-C [facility] None  11/12/2023  9:15 AM [facility] ANNUAL WELLNESS VISIT 1 [facility]  02/04/2024  1:00 PM Rosann Auerbach, PhD [facility] None  02/04/2024  2:00 PM [facility] TE[facility] None  02/11/2024  2:30 PM Rosann Auerbach, PhD [facility] None    Lab/Order associations:   ICD-10-CM   1. Essential hypertension  I10     2. Hyperlipidemia, unspecified hyperlipidemia type  E78.5     3. Aortic atherosclerosis (HCC)  I70.0     4. Visual hallucinations  R44.1     5. Recurrent major depressive disorder, in remission (HCC)  F33.40       No orders of the defined types were placed  in this encounter.   Return precautions advised.  Tana Conch, MD

## 2023-07-30 NOTE — Patient Instructions (Addendum)
Let us know if you get your COVID vaccine at the pharmacy.   Should have estradiol refills at pharmacy from Dr. Lebron Conners next visit  Recommended follow up: Return in about 6 months (around 01/28/2024) for physical or sooner if needed.Schedule b4 you leave.

## 2023-07-31 DIAGNOSIS — R488 Other symbolic dysfunctions: Secondary | ICD-10-CM | POA: Diagnosis not present

## 2023-08-02 DIAGNOSIS — R488 Other symbolic dysfunctions: Secondary | ICD-10-CM | POA: Diagnosis not present

## 2023-08-07 ENCOUNTER — Other Ambulatory Visit: Payer: Self-pay | Admitting: Family Medicine

## 2023-08-09 DIAGNOSIS — R488 Other symbolic dysfunctions: Secondary | ICD-10-CM | POA: Diagnosis not present

## 2023-08-17 ENCOUNTER — Ambulatory Visit: Payer: Medicare HMO | Admitting: Physician Assistant

## 2023-08-17 DIAGNOSIS — R41841 Cognitive communication deficit: Secondary | ICD-10-CM | POA: Diagnosis not present

## 2023-08-17 DIAGNOSIS — R4185 Anosognosia: Secondary | ICD-10-CM | POA: Diagnosis not present

## 2023-08-17 DIAGNOSIS — R4789 Other speech disturbances: Secondary | ICD-10-CM | POA: Diagnosis not present

## 2023-08-17 DIAGNOSIS — R488 Other symbolic dysfunctions: Secondary | ICD-10-CM | POA: Diagnosis not present

## 2023-08-19 DIAGNOSIS — R41841 Cognitive communication deficit: Secondary | ICD-10-CM | POA: Diagnosis not present

## 2023-08-19 DIAGNOSIS — R4789 Other speech disturbances: Secondary | ICD-10-CM | POA: Diagnosis not present

## 2023-08-19 DIAGNOSIS — R4185 Anosognosia: Secondary | ICD-10-CM | POA: Diagnosis not present

## 2023-08-19 DIAGNOSIS — R488 Other symbolic dysfunctions: Secondary | ICD-10-CM | POA: Diagnosis not present

## 2023-08-20 DIAGNOSIS — R4789 Other speech disturbances: Secondary | ICD-10-CM | POA: Diagnosis not present

## 2023-08-20 DIAGNOSIS — R4185 Anosognosia: Secondary | ICD-10-CM | POA: Diagnosis not present

## 2023-08-20 DIAGNOSIS — R488 Other symbolic dysfunctions: Secondary | ICD-10-CM | POA: Diagnosis not present

## 2023-08-20 DIAGNOSIS — R41841 Cognitive communication deficit: Secondary | ICD-10-CM | POA: Diagnosis not present

## 2023-08-25 DIAGNOSIS — R488 Other symbolic dysfunctions: Secondary | ICD-10-CM | POA: Diagnosis not present

## 2023-08-25 DIAGNOSIS — R4185 Anosognosia: Secondary | ICD-10-CM | POA: Diagnosis not present

## 2023-08-25 DIAGNOSIS — R4789 Other speech disturbances: Secondary | ICD-10-CM | POA: Diagnosis not present

## 2023-08-25 DIAGNOSIS — R41841 Cognitive communication deficit: Secondary | ICD-10-CM | POA: Diagnosis not present

## 2023-08-26 ENCOUNTER — Ambulatory Visit: Payer: Medicare HMO | Admitting: Physician Assistant

## 2023-08-26 DIAGNOSIS — R102 Pelvic and perineal pain: Secondary | ICD-10-CM | POA: Diagnosis not present

## 2023-08-26 DIAGNOSIS — R4185 Anosognosia: Secondary | ICD-10-CM | POA: Diagnosis not present

## 2023-08-26 DIAGNOSIS — R41841 Cognitive communication deficit: Secondary | ICD-10-CM | POA: Diagnosis not present

## 2023-08-26 DIAGNOSIS — N3021 Other chronic cystitis with hematuria: Secondary | ICD-10-CM | POA: Diagnosis not present

## 2023-08-26 DIAGNOSIS — R488 Other symbolic dysfunctions: Secondary | ICD-10-CM | POA: Diagnosis not present

## 2023-08-26 DIAGNOSIS — R4789 Other speech disturbances: Secondary | ICD-10-CM | POA: Diagnosis not present

## 2023-08-27 DIAGNOSIS — R4789 Other speech disturbances: Secondary | ICD-10-CM | POA: Diagnosis not present

## 2023-08-27 DIAGNOSIS — R41841 Cognitive communication deficit: Secondary | ICD-10-CM | POA: Diagnosis not present

## 2023-08-27 DIAGNOSIS — R488 Other symbolic dysfunctions: Secondary | ICD-10-CM | POA: Diagnosis not present

## 2023-08-27 DIAGNOSIS — R4185 Anosognosia: Secondary | ICD-10-CM | POA: Diagnosis not present

## 2023-08-31 DIAGNOSIS — R488 Other symbolic dysfunctions: Secondary | ICD-10-CM | POA: Diagnosis not present

## 2023-08-31 DIAGNOSIS — R4185 Anosognosia: Secondary | ICD-10-CM | POA: Diagnosis not present

## 2023-08-31 DIAGNOSIS — R41841 Cognitive communication deficit: Secondary | ICD-10-CM | POA: Diagnosis not present

## 2023-08-31 DIAGNOSIS — R4789 Other speech disturbances: Secondary | ICD-10-CM | POA: Diagnosis not present

## 2023-09-01 DIAGNOSIS — R4789 Other speech disturbances: Secondary | ICD-10-CM | POA: Diagnosis not present

## 2023-09-01 DIAGNOSIS — R488 Other symbolic dysfunctions: Secondary | ICD-10-CM | POA: Diagnosis not present

## 2023-09-01 DIAGNOSIS — R4185 Anosognosia: Secondary | ICD-10-CM | POA: Diagnosis not present

## 2023-09-01 DIAGNOSIS — R41841 Cognitive communication deficit: Secondary | ICD-10-CM | POA: Diagnosis not present

## 2023-09-03 ENCOUNTER — Ambulatory Visit (INDEPENDENT_AMBULATORY_CARE_PROVIDER_SITE_OTHER): Payer: Medicare PPO | Admitting: Physician Assistant

## 2023-09-03 ENCOUNTER — Encounter: Payer: Self-pay | Admitting: Physician Assistant

## 2023-09-03 VITALS — BP 140/70 | HR 73 | Resp 20 | Wt 144.0 lb

## 2023-09-03 DIAGNOSIS — G3184 Mild cognitive impairment, so stated: Secondary | ICD-10-CM

## 2023-09-03 MED ORDER — DONEPEZIL HCL 10 MG PO TABS
5.0000 mg | ORAL_TABLET | Freq: Every evening | ORAL | 11 refills | Status: DC
Start: 2023-09-03 — End: 2024-05-16
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
## 2023-09-03 NOTE — Patient Instructions (Addendum)
It was a pleasure to see you today at our office.   Recommendations:  Repeat Neurocognitive evaluation at our office in 1 year  Increase Donepezil to 10 mg daily. Side effects discussed   Remember you have a Neuropsychological evaluation in June   Replenish B12 daily  Follow up in 6 months  Whom to call:  Memory  decline, memory medications: Call our office [phone]   For psychiatric meds, mood meds: Please have your primary care physician manage these medications.      For assessment of decision of mental capacity and competency:  Call Dr. Erick Blinks, geriatric psychiatrist at [phone]  For guidance in geriatric dementia issues please call [facility] [phone]   If you have any severe symptoms of a stroke, or other severe issues such as confusion,severe chills or fever, etc call 911 or go to the ER as you may need to be evaluated further      RECOMMENDATIONS FOR ALL PATIENTS WITH MEMORY PROBLEMS: 1. Continue to exercise (Recommend 30 minutes of walking everyday, or 3 hours every week) 2. Increase social interactions - continue going to [place] and enjoy social gatherings with friends and family 3. Eat healthy, avoid fried foods and eat more fruits and vegetables 4. Maintain adequate blood pressure, blood sugar, and blood cholesterol level. Reducing the risk of stroke and cardiovascular disease also helps promoting better memory. 5. Avoid stressful situations. Live a simple life and avoid aggravations. Organize your time and prepare for the next day in anticipation. 6. Sleep well, avoid any interruptions of sleep and avoid any distractions in the bedroom that may interfere with adequate sleep quality 7. Avoid sugar, avoid sweets as there is a strong link between excessive sugar intake, diabetes, and cognitive impairment We discussed the Mediterranean diet, which has been shown to help patients reduce the risk of progressive memory disorders and reduces  cardiovascular risk. This includes eating fish, eat fruits and green leafy vegetables, nuts like almonds and hazelnuts, walnuts, and also use olive oil. Avoid fast foods and fried foods as much as possible. Avoid sweets and sugar as sugar use has been linked to worsening of memory function.  There is always a concern of gradual progression of memory problems. If this is the case, then we may need to adjust level of care according to patient needs. Support, both to the patient and caregiver, should then be put into place.      You have been referred for a neuropsychological evaluation (i.e., evaluation of memory and thinking abilities). Please bring someone with you to this appointment if possible, as it is helpful for the doctor to hear from both you and another adult who knows you well. Please bring eyeglasses and hearing aids if you wear them.    The evaluation will take approximately 3 hours and has two parts:   The first part is a clinical interview with the neuropsychologist (Dr. Milbert Coulter or Dr. Roseanne Reno). During the interview, the neuropsychologist will speak with you and the individual you brought to the appointment.    The second part of the evaluation is testing with the doctor's technician Annabelle Harman or Selena Batten). During the testing, the technician will ask you to remember different types of material, solve problems, and answer some questionnaires. Your family member will not be present for this portion of the evaluation.   Please note: We must reserve several hours of the neuropsychologist's time and the psychometrician's time for your evaluation appointment. As such, there is a No-Show  fee of $100. If you are unable to attend any of your appointments, please contact our office as soon as possible to reschedule.    FALL PRECAUTIONS: Be cautious when walking. Scan the area for obstacles that may increase the risk of trips and falls. When getting up in the mornings, sit up at the edge of the bed for a  few minutes before getting out of bed. Consider elevating the bed at the head end to avoid drop of blood pressure when getting up. Walk always in a well-lit room (use night lights in the walls). Avoid area rugs or power cords from appliances in the middle of the walkways. Use a walker or a cane if necessary and consider physical therapy for balance exercise. Get your eyesight checked regularly.  FINANCIAL OVERSIGHT: Supervision, especially oversight when making financial decisions or transactions is also recommended.  HOME SAFETY: Consider the safety of the kitchen when operating appliances like stoves, microwave oven, and blender. Consider having supervision and share cooking responsibilities until no longer able to participate in those. Accidents with firearms and other hazards in the house should be identified and addressed as well.   ABILITY TO BE LEFT ALONE: If patient is unable to contact 911 operator, consider using LifeLine, or when the need is there, arrange for someone to stay with patients. Smoking is a fire hazard, consider supervision or cessation. Risk of wandering should be assessed by caregiver and if detected at any point, supervision and safe proof recommendations should be instituted.  MEDICATION SUPERVISION: Inability to self-administer medication needs to be constantly addressed. Implement a mechanism to ensure safe administration of the medications.      Mediterranean Diet A Mediterranean diet refers to food and lifestyle choices that are based on the traditions of countries located on the [employer]. This way of eating has been shown to help prevent certain conditions and improve outcomes for people who have chronic diseases, like kidney disease and heart disease. What are tips for following this plan? Lifestyle  Cook and eat meals together with your family, when possible. Drink enough fluid to keep your urine clear or pale yellow. Be physically active every day.  This includes: Aerobic exercise like running or swimming. Leisure activities like gardening, walking, or housework. Get 7-8 hours of sleep each night. If recommended by your health care provider, drink red wine in moderation. This means 1 glass a day for nonpregnant women and 2 glasses a day for men. A glass of wine equals 5 oz (150 mL). Reading food labels  Check the serving size of packaged foods. For foods such as rice and pasta, the serving size refers to the amount of cooked product, not dry. Check the total fat in packaged foods. Avoid foods that have saturated fat or trans fats. Check the ingredients list for added sugars, such as corn syrup. Shopping  At the grocery store, buy most of your food from the areas near the walls of the store. This includes: Fresh fruits and vegetables (produce). Grains, beans, nuts, and seeds. Some of these may be available in unpackaged forms or large amounts (in bulk). Fresh seafood. Poultry and eggs. Low-fat dairy products. Buy whole ingredients instead of prepackaged foods. Buy fresh fruits and vegetables in-season from local farmers markets. Buy frozen fruits and vegetables in resealable bags. If you do not have access to quality fresh seafood, buy precooked frozen shrimp or canned fish, such as tuna, salmon, or sardines. Buy small amounts of raw or cooked vegetables, salads,  or olives from the deli or salad bar at your store. Stock your pantry so you always have certain foods on hand, such as olive oil, canned tuna, canned tomatoes, rice, pasta, and beans. Cooking  Cook foods with extra-virgin olive oil instead of using butter or other vegetable oils. Have meat as a side dish, and have vegetables or grains as your main dish. This means having meat in small portions or adding small amounts of meat to foods like pasta or stew. Use beans or vegetables instead of meat in common dishes like chili or lasagna. Experiment with different cooking methods.  Try roasting or broiling vegetables instead of steaming or sauteing them. Add frozen vegetables to soups, stews, pasta, or rice. Add nuts or seeds for added healthy fat at each meal. You can add these to yogurt, salads, or vegetable dishes. Marinate fish or vegetables using olive oil, lemon juice, garlic, and fresh herbs. Meal planning  Plan to eat 1 vegetarian meal one day each week. Try to work up to 2 vegetarian meals, if possible. Eat seafood 2 or more times a week. Have healthy snacks readily available, such as: Vegetable sticks with hummus. Greek yogurt. Fruit and nut trail mix. Eat balanced meals throughout the week. This includes: Fruit: 2-3 servings a day Vegetables: 4-5 servings a day Low-fat dairy: 2 servings a day Fish, poultry, or lean meat: 1 serving a day Beans and legumes: 2 or more servings a week Nuts and seeds: 1-2 servings a day Whole grains: 6-8 servings a day Extra-virgin olive oil: 3-4 servings a day Limit red meat and sweets to only a few servings a month What are my food choices? Mediterranean diet Recommended Grains: Whole-grain pasta. Brown rice. Bulgar wheat. Polenta. Couscous. Whole-wheat bread. Orpah Cobb. Vegetables: Artichokes. Beets. Broccoli. Cabbage. Carrots. Eggplant. Green beans. Chard. Kale. Spinach. Onions. Leeks. Peas. Squash. Tomatoes. Peppers. Radishes. Fruits: Apples. Apricots. Avocado. Berries. Bananas. Cherries. Dates. Figs. Grapes. Lemons. Melon. Oranges. Peaches. Plums. Pomegranate. Meats and other protein foods: Beans. Almonds. Sunflower seeds. Pine nuts. Peanuts. Cod. Salmon. Scallops. Shrimp. Tuna. Tilapia. Clams. Oysters. Eggs. Dairy: Low-fat milk. Cheese. Greek yogurt. Beverages: Water. Red wine. Herbal tea. Fats and oils: Extra virgin olive oil. Avocado oil. Grape seed oil. Sweets and desserts: [place] yogurt with honey. Baked apples. Poached pears. Trail mix. Seasoning and other foods: Basil. Cilantro. Coriander. Cumin. Mint.  Parsley. Sage. Rosemary. Tarragon. Garlic. Oregano. Thyme. Pepper. Balsalmic vinegar. Tahini. Hummus. Tomato sauce. Olives. Mushrooms. Limit these Grains: Prepackaged pasta or rice dishes. Prepackaged cereal with added sugar. Vegetables: Deep fried potatoes (french fries). Fruits: Fruit canned in syrup. Meats and other protein foods: Beef. Pork. Lamb. Poultry with skin. Hot dogs. Tomasa Blase. Dairy: Ice cream. Sour cream. Whole milk. Beverages: Juice. Sugar-sweetened soft drinks. Beer. Liquor and spirits. Fats and oils: Butter. Canola oil. Vegetable oil. Beef fat (tallow). Lard. Sweets and desserts: Cookies. Cakes. Pies. Candy. Seasoning and other foods: Mayonnaise. Premade sauces and marinades. The items listed may not be a complete list. Talk with your dietitian about what dietary choices are right for you. Summary The Mediterranean diet includes both food and lifestyle choices. Eat a variety of fresh fruits and vegetables, beans, nuts, seeds, and whole grains. Limit the amount of red meat and sweets that you eat. Talk with your health care provider about whether it is safe for you to drink red wine in moderation. This means 1 glass a day for nonpregnant women and 2 glasses a day for men. A glass of wine equals 5  oz (150 mL). This information is not intended to replace advice given to you by your health care provider. Make sure you discuss any questions you have with your health care provider. Document Released: 03/20/2016 Document Revised: 04/22/2016 Document Reviewed: 03/20/2016 Elsevier Interactive Patient Education  2017 [employer].

## 2023-09-04 ENCOUNTER — Other Ambulatory Visit: Payer: Self-pay | Admitting: Family Medicine

## 2023-09-04 DIAGNOSIS — R4185 Anosognosia: Secondary | ICD-10-CM | POA: Diagnosis not present

## 2023-09-04 DIAGNOSIS — R41841 Cognitive communication deficit: Secondary | ICD-10-CM | POA: Diagnosis not present

## 2023-09-04 DIAGNOSIS — R488 Other symbolic dysfunctions: Secondary | ICD-10-CM | POA: Diagnosis not present

## 2023-09-04 DIAGNOSIS — R4789 Other speech disturbances: Secondary | ICD-10-CM | POA: Diagnosis not present

## 2023-09-07 DIAGNOSIS — R4789 Other speech disturbances: Secondary | ICD-10-CM | POA: Diagnosis not present

## 2023-09-07 DIAGNOSIS — R4185 Anosognosia: Secondary | ICD-10-CM | POA: Diagnosis not present

## 2023-09-07 DIAGNOSIS — R488 Other symbolic dysfunctions: Secondary | ICD-10-CM | POA: Diagnosis not present

## 2023-09-07 DIAGNOSIS — R41841 Cognitive communication deficit: Secondary | ICD-10-CM | POA: Diagnosis not present

## 2023-09-09 DIAGNOSIS — R4185 Anosognosia: Secondary | ICD-10-CM | POA: Diagnosis not present

## 2023-09-09 DIAGNOSIS — R4789 Other speech disturbances: Secondary | ICD-10-CM | POA: Diagnosis not present

## 2023-09-09 DIAGNOSIS — R488 Other symbolic dysfunctions: Secondary | ICD-10-CM | POA: Diagnosis not present

## 2023-09-09 DIAGNOSIS — R41841 Cognitive communication deficit: Secondary | ICD-10-CM | POA: Diagnosis not present

## 2023-09-11 DIAGNOSIS — R41841 Cognitive communication deficit: Secondary | ICD-10-CM | POA: Diagnosis not present

## 2023-09-11 DIAGNOSIS — R488 Other symbolic dysfunctions: Secondary | ICD-10-CM | POA: Diagnosis not present

## 2023-09-11 DIAGNOSIS — R4185 Anosognosia: Secondary | ICD-10-CM | POA: Diagnosis not present

## 2023-09-11 DIAGNOSIS — R4789 Other speech disturbances: Secondary | ICD-10-CM | POA: Diagnosis not present

## 2023-09-15 DIAGNOSIS — R488 Other symbolic dysfunctions: Secondary | ICD-10-CM | POA: Diagnosis not present

## 2023-09-15 DIAGNOSIS — R41841 Cognitive communication deficit: Secondary | ICD-10-CM | POA: Diagnosis not present

## 2023-09-15 DIAGNOSIS — R4789 Other speech disturbances: Secondary | ICD-10-CM | POA: Diagnosis not present

## 2023-09-15 DIAGNOSIS — R4185 Anosognosia: Secondary | ICD-10-CM | POA: Diagnosis not present

## 2023-09-16 ENCOUNTER — Telehealth: Payer: Self-pay | Admitting: Physician Assistant

## 2023-09-16 NOTE — Telephone Encounter (Signed)
 Pt called in wanting to see if she could get the name of a good psychiatrist she could see?

## 2023-09-17 DIAGNOSIS — R488 Other symbolic dysfunctions: Secondary | ICD-10-CM | POA: Diagnosis not present

## 2023-09-17 DIAGNOSIS — R41841 Cognitive communication deficit: Secondary | ICD-10-CM | POA: Diagnosis not present

## 2023-09-17 DIAGNOSIS — R4789 Other speech disturbances: Secondary | ICD-10-CM | POA: Diagnosis not present

## 2023-09-17 DIAGNOSIS — R4185 Anosognosia: Secondary | ICD-10-CM | POA: Diagnosis not present

## 2023-09-18 DIAGNOSIS — R488 Other symbolic dysfunctions: Secondary | ICD-10-CM | POA: Diagnosis not present

## 2023-09-18 DIAGNOSIS — R41841 Cognitive communication deficit: Secondary | ICD-10-CM | POA: Diagnosis not present

## 2023-09-18 DIAGNOSIS — R4185 Anosognosia: Secondary | ICD-10-CM | POA: Diagnosis not present

## 2023-09-18 DIAGNOSIS — R4789 Other speech disturbances: Secondary | ICD-10-CM | POA: Diagnosis not present

## 2023-09-21 DIAGNOSIS — R4789 Other speech disturbances: Secondary | ICD-10-CM | POA: Diagnosis not present

## 2023-09-21 DIAGNOSIS — R41841 Cognitive communication deficit: Secondary | ICD-10-CM | POA: Diagnosis not present

## 2023-09-21 DIAGNOSIS — R4185 Anosognosia: Secondary | ICD-10-CM | POA: Diagnosis not present

## 2023-09-21 DIAGNOSIS — R488 Other symbolic dysfunctions: Secondary | ICD-10-CM | POA: Diagnosis not present

## 2023-09-22 ENCOUNTER — Ambulatory Visit: Payer: Medicare PPO | Admitting: Family Medicine

## 2023-09-22 ENCOUNTER — Encounter: Payer: Self-pay | Admitting: Family Medicine

## 2023-09-22 VITALS — BP 128/72 | HR 74 | Temp 97.5°F | Wt 148.2 lb

## 2023-09-22 DIAGNOSIS — G3184 Mild cognitive impairment, so stated: Secondary | ICD-10-CM | POA: Diagnosis not present

## 2023-09-22 DIAGNOSIS — I10 Essential (primary) hypertension: Secondary | ICD-10-CM

## 2023-09-22 DIAGNOSIS — F419 Anxiety disorder, unspecified: Secondary | ICD-10-CM | POA: Diagnosis not present

## 2023-09-22 DIAGNOSIS — R441 Visual hallucinations: Secondary | ICD-10-CM | POA: Diagnosis not present

## 2023-09-22 MED ORDER — DIVALPROEX SODIUM 125 MG PO DR TAB: 125.0000 mg | DELAYED_RELEASE_TABLET | Freq: Every evening | ORAL | 5 refills | Status: DC

## 2023-09-22 NOTE — Assessment & Plan Note (Signed)
See other note from today-trial Depakote low-dose 125 mg in the evening

## 2023-09-22 NOTE — Assessment & Plan Note (Signed)
#   Early Alzheimer's per Dr. Milbert Coulter on 01/15/2023 with hallucinations S: Medication: donepezil 10 mg daily -Patient has dealt with hallucinations for years-mainly at night but keeping the blinds closed helps. Reports symptoms worse since last visit. Had wanted to hold off on depakote and seroquel due to side effects at that time.  - a few weeks ago was walking out and heard a noise- person Alinda Money that works there she perceived he yelled at her and upset her (no one else noted this). When she got back to her apartment saw faces drawn on cardboard box outside of apartment. Alinda Money later came up and tried to "erase" it and that upset her as well. She perceives that someone is coming in apartment and drawing these things. Son confirms this is not happening. She believed someone was drawing faces on bedroom floor.  -also interested in seeing therapist A/P: Patient with early Alzheimer's dementia recently increased to 10 mg of donepezil through neurology who has noted worsening and more intrusive hallucinations -I previously discussed the situation with Marlowe Kays, PA with neurology who recommended considering Depakote 125 mg at night or if no cardiac issues/QT concerns possible low-dose Seroquel though noted blackbox warning.  Previously patient felt like the risks outweigh benefits but with worsening symptoms she would like to trial the Depakote-we discussed this is an off label usage for agitation and Alzheimer's but she would like to move forward with that -She also wants to pursue therapy and referral was placed -Son with her today and very supportive - Does have depression and anxiety and already on venlafaxine in the daytime and trazodone-she will continue current medications as that overall appears steady outside of the hallucinations -I also thought about her simvastatin and possibly holding that at least short-term to see if any improvement-discuss further at 1 month follow-up

## 2023-09-22 NOTE — Addendum Note (Signed)
Addended by: Shelva Majestic on: 09/22/2023 09:27 PM   Modules accepted: Level of Service

## 2023-09-22 NOTE — Progress Notes (Signed)
Phone [phone] In person visit   Subjective:   [name] is a 88 y.o. year old very pleasant female patient who presents for/with See problem oriented charting Chief Complaint  Patient presents with   referral request    psych   Past Medical History-  Patient Active Problem List   Diagnosis Date Noted   Mild cognitive impairment with memory loss 12/04/2022    Priority: High   Visual hallucinations     Priority: High   Generalized anxiety disorder 12/03/2022    Priority: Medium    Major depressive disorder in remission (HCC) 10/22/2021    Priority: Medium    Aortic atherosclerosis (HCC) 01/17/2021    Priority: Medium    Liver lesion 11/10/2017    Priority: Medium    Lumbar stenosis with neurogenic claudication 03/20/2016    Priority: Medium    Macular degeneration of right eye 03/03/2013    Priority: Medium    Glaucoma 03/03/2013    Priority: Medium    Hyperlipidemia 03/14/2009    Priority: Medium    Essential hypertension 10/19/2007    Priority: Medium    GERD (gastroesophageal reflux disease) 10/19/2007    Priority: Medium    Chest pain 01/24/2018    Priority: Low   Chronic daily headache 07/08/2017    Priority: Low   Post-traumatic headache 10/11/2015    Priority: Low   Osteoarthritis 10/19/2007    Priority: Low   Stress incontinence 10/19/2007    Priority: Low   Allergic rhinitis 10/19/2007    Priority: Low   Osteopenia of neck of right femur 03/29/2019    Medications- reviewed and updated Current Outpatient Medications  Medication Sig Dispense Refill   COMBIGAN 0.2-0.5 % ophthalmic solution instill 1 drop into both eyes twice a day. 5 mL 3   divalproex (DEPAKOTE) 125 MG DR tablet Take 1 tablet (125 mg total) by mouth at bedtime. 30 tablet 5   donepezil (ARICEPT) 10 MG tablet Take 0.5 tablets (5 mg total) by mouth at bedtime. Take one tablet daily 30 tablet 11   ezetimibe-simvastatin (VYTORIN) 10-20 MG tablet TAKE ONE TABLET BY MOUTH DAILY 90  tablet 1   Multiple Vitamins-Minerals (MULTIVITAMIN ADULT) CHEW Chew 2 each by mouth daily. Nature Made for Her     nadolol (CORGARD) 20 MG tablet Take 1 tablet (20 mg total) by mouth at bedtime. 90 tablet 3   nitrofurantoin (MACRODANTIN) 50 MG capsule TAKE ONE CAPSULE ONCE DAILY 90 capsule 0   omeprazole (PRILOSEC) 20 MG capsule Take 20 mg by mouth daily. In the morning for acid reflux over the counter     traZODone (DESYREL) 100 MG tablet take 1/4 TO 1/2 tablet AT BEDTIME 45 tablet 3   venlafaxine XR (EFFEXOR-XR) 150 MG 24 hr capsule Take 1 capsule (150 mg total) by mouth daily with breakfast. 90 capsule 3   No current facility-administered medications for this visit.     Objective:  BP 128/72   Pulse 74   Temp (!) 97.5 F (36.4 C)   Wt 148 lb 3.2 oz (67.2 kg)   LMP 08/11/1974   SpO2 98%   BMI 28.94 kg/m  Gen: NAD, resting comfortably CV: RRR no murmurs rubs or gallops Lungs: CTAB no crackles, wheeze, rhonchi Ext: no edema Skin: warm, dry     Assessment and Plan   # Early Alzheimer's per Dr. Milbert Coulter on 01/15/2023 with hallucinations S: Medication: donepezil 10 mg daily -Patient has dealt with hallucinations for years-mainly at night but keeping  the blinds closed helps. Reports symptoms worse since last visit. Had wanted to hold off on depakote and seroquel due to side effects at that time.  - a few weeks ago was walking out and heard a noise- person Alinda Money that works there she perceived he yelled at her and upset her (no one else noted this). When she got back to her apartment saw faces drawn on cardboard box outside of apartment. Alinda Money later came up and tried to "erase" it and that upset her as well. She perceives that someone is coming in apartment and drawing these things. Son confirms this is not happening. She believed someone was drawing faces on bedroom floor.  -also interested in seeing therapist A/P: Patient with early Alzheimer's dementia recently increased to 10 mg of  donepezil through neurology who has noted worsening and more intrusive hallucinations -I previously discussed the situation with Marlowe Kays, PA with neurology who recommended considering Depakote 125 mg at night or if no cardiac issues/QT concerns possible low-dose Seroquel though noted blackbox warning.  Previously patient felt like the risks outweigh benefits but with worsening symptoms she would like to trial the Depakote-we discussed this is an off label usage for agitation and Alzheimer's but she would like to move forward with that -She also wants to pursue therapy and referral was placed -Son with her today and very supportive - Does have depression and anxiety and already on venlafaxine in the daytime and trazodone-she will continue current medications as that overall appears steady outside of the hallucinations -I also thought about her simvastatin and possibly holding that at least short-term to see if any improvement-discuss further at 1 month follow-up   #hypertension S: medication: Nadolol 20 mg daily A/P: Well-controlled-continue current medication  Recommended follow up: Return in about 1 month (around 10/20/2023) for followup or sooner if needed.Schedule b4 you leave. Future Appointments  Date Time Provider Department Center  10/27/2023 11:00 AM Shelva Majestic, MD [facility]  11/12/2023  9:20 AM [facility] ANNUAL WELLNESS VISIT 1 [facility]  01/29/2024  1:00 PM Shelva Majestic, MD [facility]  02/04/2024  1:00 PM Rosann Auerbach, PhD [facility] None  02/04/2024  2:00 PM [facility]OPSY[facility] TECH [facility] None  02/11/2024  2:30 PM Rosann Auerbach, PhD [facility] None  03/02/2024  2:30 PM Gwynneth Munson, Sung Amabile, PA-C [facility] None    Lab/Order associations:   ICD-10-CM   1. Mild cognitive impairment with memory loss  G31.84     2. Visual hallucinations  R44.1     3. Essential hypertension  I10     4. Anxiety  F41.9 Ambulatory referral to Psychology      Meds ordered this encounter   Medications   divalproex (DEPAKOTE) 125 MG DR tablet    Sig: Take 1 tablet (125 mg total) by mouth at bedtime.    Dispense:  30 tablet    Refill:  5    Return precautions advised.  Tana Conch, MD

## 2023-09-22 NOTE — Patient Instructions (Addendum)
Try depakote low dose in evening- may go up at next visit but lets make sure you tolerate it first  Please call [phone] to schedule a visit with [facility] - please tell the office you were directly referred by Dr. Sheppard Plumber sometimes can be backed up for up to 2 months so here are some other options to check in on: - [email or web address]  at  [phone] [facility] [email or web address] at [phone]   Recommended follow up: Return in about 1 month (around 10/20/2023) for followup or sooner if needed.Schedule b4 you leave.

## 2023-09-23 DIAGNOSIS — R4185 Anosognosia: Secondary | ICD-10-CM | POA: Diagnosis not present

## 2023-09-23 DIAGNOSIS — R41841 Cognitive communication deficit: Secondary | ICD-10-CM | POA: Diagnosis not present

## 2023-09-23 DIAGNOSIS — R488 Other symbolic dysfunctions: Secondary | ICD-10-CM | POA: Diagnosis not present

## 2023-09-23 DIAGNOSIS — R4789 Other speech disturbances: Secondary | ICD-10-CM | POA: Diagnosis not present

## 2023-09-27 DIAGNOSIS — R4185 Anosognosia: Secondary | ICD-10-CM | POA: Diagnosis not present

## 2023-09-27 DIAGNOSIS — R4789 Other speech disturbances: Secondary | ICD-10-CM | POA: Diagnosis not present

## 2023-09-27 DIAGNOSIS — R488 Other symbolic dysfunctions: Secondary | ICD-10-CM | POA: Diagnosis not present

## 2023-09-27 DIAGNOSIS — R41841 Cognitive communication deficit: Secondary | ICD-10-CM | POA: Diagnosis not present

## 2023-09-28 DIAGNOSIS — R4789 Other speech disturbances: Secondary | ICD-10-CM | POA: Diagnosis not present

## 2023-09-28 DIAGNOSIS — R4185 Anosognosia: Secondary | ICD-10-CM | POA: Diagnosis not present

## 2023-09-28 DIAGNOSIS — R41841 Cognitive communication deficit: Secondary | ICD-10-CM | POA: Diagnosis not present

## 2023-09-28 DIAGNOSIS — R488 Other symbolic dysfunctions: Secondary | ICD-10-CM | POA: Diagnosis not present

## 2023-09-29 DIAGNOSIS — R41841 Cognitive communication deficit: Secondary | ICD-10-CM | POA: Diagnosis not present

## 2023-09-29 DIAGNOSIS — R4185 Anosognosia: Secondary | ICD-10-CM | POA: Diagnosis not present

## 2023-09-29 DIAGNOSIS — R488 Other symbolic dysfunctions: Secondary | ICD-10-CM | POA: Diagnosis not present

## 2023-09-29 DIAGNOSIS — R4789 Other speech disturbances: Secondary | ICD-10-CM | POA: Diagnosis not present

## 2023-09-30 ENCOUNTER — Telehealth: Payer: Self-pay | Admitting: *Deleted

## 2023-09-30 NOTE — Telephone Encounter (Signed)
Copied from CRM [id number]. Topic: Clinical - Prescription Issue >> Sep 30, 2023 12:26 PM [place] wrote: Reason for CRM: Patient son returning call, he would like them to be sent separate please.

## 2023-09-30 NOTE — Telephone Encounter (Signed)
Left message on son's voicemail to call office.

## 2023-09-30 NOTE — Telephone Encounter (Signed)
Spoke to pt's son, see other message.

## 2023-09-30 NOTE — Telephone Encounter (Signed)
Spoke to pt's son, told him I contacted the pharmacy and they said they left a message for pt to call pharmacy, but no one returned their call. They want to know if patient wants the medication now or wait till next Blister packs are sent out. Mr. Strum said, it is best to send out medication now since she was suppose to start it. Told him to please contact the pharmacy and let them know and they said they can still deliver today, also give them your number to contact you. Mr. Storer verbalized understanding and said he would call them but needs number. Phone number given to him and told him that pt called back, just let her know you spoke to [place]. Mr. Stangl verbalized understanding and said thank you.

## 2023-09-30 NOTE — Telephone Encounter (Signed)
Copied from CRM [id number]. Topic: General - Other >> Sep 30, 2023 12:21 PM Kathryne Eriksson wrote: Reason for CRM: Returning Office Call >> Sep 30, 2023 12:24 PM Kathryne Eriksson wrote: Patient called in returning a office call, but wasn't 100% sure what the call was even in regards to. I seen the previous CRM where the son had called in regards to her medication, therefor I'm assuming that's what it was about since there weren't any documented notes that anyone had called.   Spoke to pt's son, see other message.

## 2023-09-30 NOTE — Telephone Encounter (Signed)
Copied from CRM [id number]. Topic: Clinical - Prescription Issue >> Sep 30, 2023  9:54 AM Theodis Sato wrote: Reason for CRM: Patients son states that the patients pharmacy is awaiting some sort of confirmation from Dr. Durene Cal for the divalproex (DEPAKOTE) 125 MG DR tablet before they can fill the medication. Patients son did not specify what the pharmacy needs but states the pharmacy sent a request.

## 2023-09-30 NOTE — Telephone Encounter (Signed)
 Tried to contact patient no answer

## 2023-09-30 NOTE — Telephone Encounter (Signed)
Called [facility] pharmacy and spoke to [place], asked her if they received refill for Depakote? Erskine Squibb said yes. Told her son called [place] saying there was a problem. Erskine Squibb said that they received the order after they had already did her Blister Pack order and they just need to know if she wants to wait till next Blister pack order which is 4th of the month or send separate. Erskine Squibb said they did contact pt but she never called back. Told her okay I will let the son know to call.

## 2023-10-02 ENCOUNTER — Other Ambulatory Visit: Payer: Self-pay | Admitting: Family Medicine

## 2023-10-02 DIAGNOSIS — R41841 Cognitive communication deficit: Secondary | ICD-10-CM | POA: Diagnosis not present

## 2023-10-02 DIAGNOSIS — R4789 Other speech disturbances: Secondary | ICD-10-CM | POA: Diagnosis not present

## 2023-10-02 DIAGNOSIS — R488 Other symbolic dysfunctions: Secondary | ICD-10-CM | POA: Diagnosis not present

## 2023-10-02 DIAGNOSIS — N390 Urinary tract infection, site not specified: Secondary | ICD-10-CM

## 2023-10-02 DIAGNOSIS — R4185 Anosognosia: Secondary | ICD-10-CM | POA: Diagnosis not present

## 2023-10-05 ENCOUNTER — Ambulatory Visit: Payer: Self-pay | Admitting: Family Medicine

## 2023-10-05 DIAGNOSIS — R4185 Anosognosia: Secondary | ICD-10-CM | POA: Diagnosis not present

## 2023-10-05 DIAGNOSIS — R488 Other symbolic dysfunctions: Secondary | ICD-10-CM | POA: Diagnosis not present

## 2023-10-05 DIAGNOSIS — R41841 Cognitive communication deficit: Secondary | ICD-10-CM | POA: Diagnosis not present

## 2023-10-05 DIAGNOSIS — R4789 Other speech disturbances: Secondary | ICD-10-CM | POA: Diagnosis not present

## 2023-10-05 NOTE — Telephone Encounter (Signed)
 See below, FYI pt is scheduled already to see you on 03/18.

## 2023-10-05 NOTE — Telephone Encounter (Signed)
 It is possible she will adjust to the medicine if she continues to take it and give it a little more time.  I would say if the dizziness is severe or fatigue severe to go ahead and stop though-we certainly want to avoid falls

## 2023-10-05 NOTE — Telephone Encounter (Signed)
 Copied from CRM [id number]. Topic: Clinical - Red Word Triage >> Oct 05, 2023 11:11 AM Almira Coaster wrote: Red Word that prompted transfer to Nurse Triage: Patient is calling because she has been taking divalproex (DEPAKOTE) 125 MG DR tablet, and per patient she has started to develop fatigue and dizziness.   Chief Complaint: Dizziness Symptoms:  Dizziness, fatigue  Frequency: Constant  Disposition: [] ED /[] Urgent Care (no appt availability in office) / [x] Appointment(In office/virtual)/ []  [place] [facility]/ [] Home Care/ [x] Refused Recommended Disposition /[] [place] [facility]/ []  Follow-up with PCP Additional Notes: Patient reports she started taking Depakote 2 nights ago and has been experiencing dizziness and fatigue since that time. She states that she has only taken the one dose. I advised the patient that this is a side effect of the medication and offered an appointment in the office to discuss her symptoms. Patient advised of Dr. Erasmo Leventhal availability and declined an appointment with another provider. Patient states she has an appointment Friday with another provider and will discuss it with them.   Patient would like a call back about the medication reaction and would like some guidance of what to do.    Reason for Disposition  [1] MODERATE dizziness (e.g., interferes with normal activities) AND [2] has NOT been evaluated by doctor (or NP/PA) for this  (Exception: Dizziness caused by heat exposure, sudden standing, or poor fluid intake.)  Answer Assessment - Initial Assessment Questions 1. DESCRIPTION: "Describe your dizziness."     Feel like I'm going to fall when getting out of bed 2. LIGHTHEADED: "Do you feel lightheaded?" (e.g., somewhat faint, woozy, weak upon standing)     Yes 3. VERTIGO: "Do you feel like either you or the room is spinning or tilting?" (i.e. vertigo)     No 4. SEVERITY: "How bad is it?"  "Do you feel like you are going to faint?" "Can you stand and  walk?"   - MILD: Feels slightly dizzy, but walking normally.   - MODERATE: Feels unsteady when walking, but not falling; interferes with normal activities (e.g., school, work).   - SEVERE: Unable to walk without falling, or requires assistance to walk without falling; feels like passing out now.      Moderate  5. ONSET:  "When did the dizziness begin?"     2 days ago  6. AGGRAVATING FACTORS: "Does anything make it worse?" (e.g., standing, change in head position)     Standing up from laying or sitting 7. HEART RATE: "Can you tell me your heart rate?" "How many beats in 15 seconds?"  (Note: not all patients can do this)       N/A 8. CAUSE: "What do you think is causing the dizziness?"     Recently started taking Depakote  9. RECURRENT SYMPTOM: "Have you had dizziness before?" If Yes, ask: "When was the last time?" "What happened that time?"     New symptom with medication  10. OTHER SYMPTOMS: "Do you have any other symptoms?" (e.g., fever, chest pain, vomiting, diarrhea, bleeding)       Fatigue  Protocols used: Dizziness - Lightheadedness-A-AH

## 2023-10-06 DIAGNOSIS — R41841 Cognitive communication deficit: Secondary | ICD-10-CM | POA: Diagnosis not present

## 2023-10-06 DIAGNOSIS — R488 Other symbolic dysfunctions: Secondary | ICD-10-CM | POA: Diagnosis not present

## 2023-10-06 DIAGNOSIS — R4185 Anosognosia: Secondary | ICD-10-CM | POA: Diagnosis not present

## 2023-10-06 DIAGNOSIS — R4789 Other speech disturbances: Secondary | ICD-10-CM | POA: Diagnosis not present

## 2023-10-06 NOTE — Telephone Encounter (Signed)
Called and spoke with pt and below message given.

## 2023-10-06 NOTE — Telephone Encounter (Signed)
Called and lm for pt tcb. 

## 2023-10-08 DIAGNOSIS — R488 Other symbolic dysfunctions: Secondary | ICD-10-CM | POA: Diagnosis not present

## 2023-10-08 DIAGNOSIS — R4789 Other speech disturbances: Secondary | ICD-10-CM | POA: Diagnosis not present

## 2023-10-08 DIAGNOSIS — R4185 Anosognosia: Secondary | ICD-10-CM | POA: Diagnosis not present

## 2023-10-08 DIAGNOSIS — R41841 Cognitive communication deficit: Secondary | ICD-10-CM | POA: Diagnosis not present

## 2023-10-09 ENCOUNTER — Ambulatory Visit: Payer: Medicare PPO | Admitting: Psychology

## 2023-10-09 DIAGNOSIS — F411 Generalized anxiety disorder: Secondary | ICD-10-CM

## 2023-10-09 DIAGNOSIS — F325 Major depressive disorder, single episode, in full remission: Secondary | ICD-10-CM

## 2023-10-09 DIAGNOSIS — F329 Major depressive disorder, single episode, unspecified: Secondary | ICD-10-CM

## 2023-10-09 NOTE — Progress Notes (Unsigned)
 [facility] Counselor Initial Adult Exam  Name: [name] Date: 10/09/2023 MRN: [id number] DOB: 01-02-1936 PCP: Shelva Majestic, MD  Time Spent: 11:00 am - 11:55 : 55 minutes  Guardian/Payee:  Self    Paperwork requested: No   Reason for Visit /Presenting Problem: "My family doctor told me I should come because sometimes I see things, and I wanted to talk to someone anyway about everything."  Mental Status Exam: Appearance:   Well Groomed     Behavior:  Appropriate  Motor:  Normal  Speech/Language:   Normal Rate  Affect:  Appropriate  Mood:  normal  Thought process:  Fluctuated throughout our session  Thought content:    Fluctuated throughout our session  Sensory/Perceptual disturbances:    Hallucinations: Auditory Visual Patient reported these where she lives but did not have them during our session.   Orientation:  oriented to person, place, and time/date  Attention:  Good  Concentration:  Good  Memory:  Fluctuates  Fund of knowledge:   Good  Insight:    Fair  Judgment:   Fair  Impulse Control:  Good   Reported Symptoms:  Feeling nervous and on edge, not being able to control worrying, trouble relaxing, becoming easily annoyed and irritable, feeling afraid as if something awful might happen, feeling down, feeling tired, and feeling like letting family down.  Risk Assessment: Danger to Self:  No Self-injurious Behavior: No Danger to Others: No Duty to Warn:no Physical Aggression / Violence:No  Access to Firearms a concern: No  Gang Involvement:No  Patient / guardian was educated about steps to take if suicide or homicide risk level increases between visits: yes While future psychiatric events cannot be accurately predicted, the patient does not currently require acute inpatient psychiatric care and does not currently meet [facility] involuntary commitment criteria.  Substance Abuse History: Current substance abuse: No     Caffeine: 1 1/2  cups per day Tobacco: Former smoker Alcohol: No Substance use: No  Past Psychiatric History:   Previous psychological history is significant for anxiety and depression v Outpatient Providers:PCP History of Psych Hospitalization: No  Psychological Testing:  No    Abuse History:  Victim of: No. Report needed: No. Victim of Neglect:No. Perpetrator of No  Witness / Exposure to Domestic Violence: No   Protective Services Involvement: No  Witness to [employer] Violence:  No   Family History:  Family History  Problem Relation Age of Onset   Congestive Heart Failure Mother    Thyroid disease Mother    Osteoporosis Mother    Alcoholism Mother    Prostate cancer Father    Hypertension Sister    Thyroid disease Sister    Cancer Sister        brain ?   Breast cancer Maternal Grandmother    Rheum arthritis Daughter     Living situation: the patient lives in an assisted living facility  Sexual Orientation: Straight  Relationship Status: widowed   If a parent, number of children / ages: 2 adult children, son brought her to today's appt.  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    Strengths: Supportive Relationships  Barriers:  N/A   Legal History: Pending legal issue / charges: The patient has no significant history of legal issues. History of legal issue / charges:  N/A  Medical History/Surgical History: reviewed Past Medical History:  Diagnosis Date    Allergic rhinitis 10/19/2007   No rx      Aortic atherosclerosis (HCC) 01/17/2021   Also with coronary calcium.  CT scan from urology 01/05/2021 ordered by Dr. Earlene Plater of [facility]   Chest pain 01/24/2018   Chronic daily headache 07/08/2017   Colon polyps    Complication of anesthesia    takes longer to wake up- admitted overnight after colonoscopy   Essential hypertension 10/19/2007   Nadolol 40mg       Generalized anxiety disorder    GERD (gastroesophageal reflux disease) 10/19/2007   prilosec 20mg  otc. Recurs if off   Glaucoma    History of COVID-19 05/2021   mild case   History of skin cancer    basal cell on nose   History of UTI (urinary tract infection)    Hypercholesteremia    Hyperlipidemia 03/14/2009   Vytorin      Lichen simplex chronicus    Liver lesion 11/10/2017   Elective MRI advised for follow up liver cyst. Planned mid 2018 after recovers from surgery   Lumbar stenosis with neurogenic claudication    Macular degeneration of right eye 03/03/2013   diagonosed by Dr. Lenny Pastel   Major depressive disorder in remission (HCC) 10/22/2021   Mild cognitive impairment with memory loss 12/04/2022   Osteoarthritis    Osteopenia of neck of right femur 03/29/2019   -1.3 worst t score 03/29/2019; also hip   PONV (postoperative nausea and vomiting)    Post-traumatic headache 10/11/2015   Saw neurology- plan was MRI but never ordered.    Scalp pain 10/22/2015   Stress incontinence 10/19/2007   2 surgeries- no improvement. Urology visits in past. May have one other surgery- holding off for now      Unsteady gait    Visual hallucinations    Wears glasses    Whooping cough    as a baby    Past Surgical History:  Procedure Laterality Date   ABDOMINAL HYSTERECTOMY  1977   ovaries remain   ANTERIOR LAT LUMBAR FUSION N/A 10/26/2017   Procedure: LUMBAR THREE- LUMBAR FOUR, LUMBAR FOUR- LUMBAR FIVE ANTEROLATERAL LUMBAR INTERBODY ARTHRODESIS;  Surgeon: Shirlean Kelly, MD;   Location: [facility] OR;  Service: Neurosurgery;  Laterality: N/A;  LUMBAR 3- LUMBAR 4, LUMBAR 4- LUMBAR 5 ANTEROLATERAL LUMBAR INTERBODY ARTHRODESIS, LUMBAR 3- LUMBAR 4, LUMBAR 4- LUMBAR 5 PERCUTANEOUS PEDICLE SCREW FIXATION   APPENDECTOMY     APPLICATION OF WOUND VAC Left 04/27/2021   Procedure: APPLICATION OF INCISIONAL WOUND VAC;  Surgeon: Terance Hart, MD;  Location: [facility] OR;  Service: Orthopedics;  Laterality: Left;   APPLICATION OF WOUND VAC Left 07/01/2021   Procedure: APPLICATION OF WOUND VAC;  Surgeon: Allena Napoleon, MD;  Location: [facility] OR;  Service: Plastics;  Laterality: Left;   BLADDER REPAIR     x two   CHOLECYSTECTOMY     COLONOSCOPY     EYE SURGERY  11/14, 12/14   Macular Dengeneration, Glaucoma   EYE SURGERY  2016   left eye   I & D EXTREMITY Left 04/27/2021   Procedure: IRRIGATION AND DEBRIDEMENT LEFT LEG;  Surgeon: Terance Hart, MD;  Location: [facility] OR;  Service: Orthopedics;  Laterality: Left;   INCISION AND DRAINAGE OF WOUND Right 04/27/2021   Procedure: IRRIGATION AND DEBRIDEMENT WOUND;  Surgeon: Terance Hart, MD;  Location: [facility] OR;  Service: Orthopedics;  Laterality: Right;   LESION REMOVAL Right 06/28/2013   Procedure: EXCISION 3 cm right labial sebaceous cyst;  Surgeon: Annamaria Boots, MD;  Location: [facility] ORS;  Service: Gynecology;  Laterality: Right;   LUMBAR LAMINECTOMY/DECOMPRESSION MICRODISCECTOMY Right 03/20/2016   Procedure: Right - Lumbar four-five lumbar laminotomy, foraminotomy, and possible microdiscectomy;  Surgeon: Shirlean Kelly, MD;  Location: [facility];  Service: Neurosurgery;  Laterality: Right;  right   LUMBAR PERCUTANEOUS PEDICLE SCREW 2 LEVEL  10/26/2017   Procedure: LUMBAR THREE- LUMBAR FOUR, LUMBAR FOUR- LUMBAR FIVE PERCUTANEOUS PEDICLE SCREW FIXATION;  Surgeon: Shirlean Kelly, MD;  Location: [facility] OR;  Service: Neurosurgery;;   MOHS SURGERY     procedure to remove basal cell   SKIN SPLIT GRAFT Left 07/01/2021   Procedure: SKIN GRAFT  SPLIT THICKNESS;  Surgeon: Allena Napoleon, MD;  Location: [facility] OR;  Service: Plastics;  Laterality: Left;  1 hour   TONSILLECTOMY AND ADENOIDECTOMY     URETHRAL SLING  2007   URETHRAL SLING  1/12   midurethral    vertebroplasty secondary to traumatic compression fracture      Medications: Current Outpatient Medications  Medication Sig Dispense Refill   COMBIGAN 0.2-0.5 % ophthalmic solution instill 1 drop into both eyes twice a day. 5 mL 3   divalproex (DEPAKOTE) 125 MG DR tablet Take 1 tablet (125 mg total) by mouth at bedtime. 30 tablet 5   donepezil (ARICEPT) 10 MG tablet Take 0.5 tablets (5 mg total) by mouth at bedtime. Take one tablet daily 30 tablet 11   ezetimibe-simvastatin (VYTORIN) 10-20 MG tablet TAKE ONE TABLET BY MOUTH DAILY 90 tablet 1   Multiple Vitamins-Minerals (MULTIVITAMIN ADULT) CHEW Chew 2 each by mouth daily. Nature Made for Her     nadolol (CORGARD) 20 MG tablet TAKE ONE TABLET BY MOUTH AT BEDTIME 90 tablet 3   nitrofurantoin (MACRODANTIN) 50 MG capsule TAKE ONE CAPSULE ONCE DAILY 90 capsule 0   omeprazole (PRILOSEC) 20 MG capsule Take 20 mg by mouth daily. In the morning for acid reflux over the counter     traZODone (DESYREL) 100 MG tablet take 1/4 TO 1/2 tablet AT BEDTIME 45 tablet 3   venlafaxine XR (EFFEXOR-XR) 150 MG 24 hr capsule TAKE ONE CAPSULE BY MOUTH EVERY MORNING 90 capsule 3   No current facility-administered medications for this visit.    Allergies  Allergen Reactions   Citrate Of Magnesia Other (See Comments)    confusion   Codeine Nausea And Vomiting   Shingrix [Zoster Vac Recomb Adjuvanted] Swelling and Rash    Rash and temp x several days. (low grade 100 -101 )     Diagnoses:  MDD and GAD  Psychiatric Treatment: Yes , via PCP  Plan of Care: OPT  Narrative:  [name] participated from office with therapist and consented to treatment. We reviewed the limits of confidentiality prior to the start of the evaluation. [name] expressed understanding and agreement to proceed.   Patient is an 88 year old female who presented for an initial assessment. Patient was referred by Dr. Tana Conch for depression and hallucinations. Patient reported the following symptoms: sometimes seeing things on her apartment windows, feeling nervous and on edge, not being able to control worrying, trouble relaxing, becoming easily annoyed and  irritable, feeling afraid as if something awful might happen, feeling down, feeling tired, and feeling like letting her family down. Patient denied current and past suicidal ideation or homicidal ideation. Patient has not smoked in 45 years, and denied any current or past alcohol or drug use. Patient reported current stressors as missing her deceased husband and wondering why she "sometimes sees things."Patient identified current supports family and friends.  Patient attended [employer], and had a career she enjoyed as a Runner, broadcasting/film/video. Patient has 50 son '53 years old, 1 Daughter 34 years old, and 5 grandchildren. 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 Patient has a Development worker, international aid. Patient's mother was an alcoholic. Patient has a step brother but he no longer talks to her, his daughter, Bonita Quin also doesn't talk to her, Linda's daughter is Maralyn Sago and she is autistic, Linda's son is Occupational hygienist. Skipper, patient's son has 3 kids, [place], Laughlin, and [place]ica. Leatha Gilding married name, has patient's great grand daughter Sheilah Pigeon. Lianne Cure is married to [place] and he is a Optician, dispensing. It is recommended that patient participate in individual therapy.  A follow-up was scheduled to create a treatment plan and begin treatment. Therapist answered  and all questions during the evaluation and contact information was provided.    [name]

## 2023-10-12 ENCOUNTER — Ambulatory Visit: Payer: Self-pay | Admitting: Family Medicine

## 2023-10-12 NOTE — Telephone Encounter (Signed)
 Chief Complaint: Abdominal pain Symptoms: Diarrhea, abdominal pain, weakness, dizziness, SOB, nausea Frequency: Comes and goes Pertinent Negatives: Patient denies Fever, vomiting, new abx, dehydration Disposition: [x] ED /[] Urgent Care (no appt availability in office) / [] Appointment(In office/virtual)/ []  [facility]/ [] Home Care/ [] Refused Recommended Disposition /[] [place] [facility]/ []  Follow-up with PCP Additional Notes: Patient called with complaints of abdominal pain, diarrhea, weakness, and dizziness. Patient states that symptoms started 2 days ago with abdominal pain that is severe, comes and goes, and she is having diarrhea 6-7 times. Patient states she feels weak and had dizziness but also take a medication that makes her dizzy. Patient states she has nausea but no vomiting, had not eaten since Saturday and had only had a fourth of a cup of ginger ale today. Patient states she is weak, has some SOB, and could be moaning over the phone in pain. Patient denies fever, exposure, consumption of rotten food, vomiting, new abx. Patient advised by this RN to go to ER per protocol to which the patient was agreeable. Patient advised by this RN note will be routed to office for PCP review. Patient verbalized understanding    Copied from CRM [id number]. Topic: Clinical - Red Word Triage >> Oct 12, 2023 10:00 AM Chantha C wrote: [facility] that prompted transfer to Nurse Triage: Patient states diarrhea, feel like vomiting, nausea, dizziness, shortness of breath, fatigue, didn't sleep well at all, and stomach pain. Patient denies a fever. Please advise [phone]. Reason for Disposition  [1] SEVERE abdominal pain AND [2] age > 60 years  Answer Assessment - Initial Assessment Questions 1. DIARRHEA SEVERITY: "How bad is the diarrhea?" "How many more stools have you had in the past 24 hours than normal?"    - NO DIARRHEA (SCALE 0)   - MILD (SCALE 1-3): Few loose or mushy BMs; increase of  1-3 stools over normal daily number of stools; mild increase in ostomy output.   -  MODERATE (SCALE 4-7): Increase of 4-6 stools daily over normal; moderate increase in ostomy output.   -  SEVERE (SCALE 8-10; OR "WORST POSSIBLE"): Increase of 7 or more stools daily over normal; moderate increase in ostomy output; incontinence.     Moderate 2. ONSET: "When did the diarrhea begin?"      Yesterday 3. BM CONSISTENCY: "How loose or watery is the diarrhea?"      Watery 4. VOMITING: "Are you also vomiting?" If Yes, ask: "How many times in the past 24 hours?"      Denies 5. ABDOMEN PAIN: "Are you having any abdomen pain?" If Yes, ask: "What does it feel like?" (e.g., crampy, dull, intermittent, constant)     "When I stand up, it will feel like something's coming." 6. ABDOMEN PAIN SEVERITY: If present, ask: "How bad is the pain?"  (e.g., Scale 1-10; mild, moderate, or severe)   - MILD (1-3): doesn't interfere with normal activities, abdomen soft and not tender to touch    - MODERATE (4-7): interferes with normal activities or awakens from sleep, abdomen tender to touch    - SEVERE (8-10): excruciating pain, doubled over, unable to do any normal activities       10 7. ORAL INTAKE: If vomiting, "Have you been able to drink liquids?" "How much liquids have you had in the past 24 hours?"     "I'm having Gingerale right now," 8. HYDRATION: "Any signs of dehydration?" (e.g., dry mouth [not just dry lips], too weak to stand, dizziness, new weight loss) "  When did you last urinate?"     "I had a glass and I drink about a fourth of it. " 9. EXPOSURE: "Have you traveled to a foreign country recently?" "Have you been exposed to anyone with diarrhea?" "Could you have eaten any food that was spoiled?"     Denies 10. ANTIBIOTIC USE: "Are you taking antibiotics now or have you taken antibiotics in the past 2 months?"     Denies 11. OTHER SYMPTOMS: "Do you have any other symptoms?" (e.g., fever, blood in stool)      Dizziness "I'm taking a certain medicine that makes me dizzy"  Protocols used: [facility]

## 2023-10-12 NOTE — Telephone Encounter (Signed)
FYI...triage note 

## 2023-10-13 ENCOUNTER — Emergency Department (HOSPITAL_COMMUNITY)

## 2023-10-13 ENCOUNTER — Emergency Department (HOSPITAL_COMMUNITY)
Admission: EM | Admit: 2023-10-13 | Discharge: 2023-10-13 | Disposition: A | Discharge status: Other Acute Inpatient Hospital | Attending: Emergency Medicine | Admitting: Emergency Medicine

## 2023-10-13 ENCOUNTER — Encounter (HOSPITAL_COMMUNITY): Payer: Self-pay | Admitting: Radiology

## 2023-10-13 DIAGNOSIS — K529 Noninfective gastroenteritis and colitis, unspecified: Secondary | ICD-10-CM | POA: Insufficient documentation

## 2023-10-13 DIAGNOSIS — R531 Weakness: Secondary | ICD-10-CM | POA: Diagnosis not present

## 2023-10-13 DIAGNOSIS — Z7401 Bed confinement status: Secondary | ICD-10-CM | POA: Diagnosis not present

## 2023-10-13 DIAGNOSIS — R197 Diarrhea, unspecified: Secondary | ICD-10-CM | POA: Diagnosis not present

## 2023-10-13 DIAGNOSIS — I10 Essential (primary) hypertension: Secondary | ICD-10-CM | POA: Diagnosis not present

## 2023-10-13 DIAGNOSIS — R109 Unspecified abdominal pain: Secondary | ICD-10-CM | POA: Diagnosis not present

## 2023-10-13 DIAGNOSIS — Z79899 Other long term (current) drug therapy: Secondary | ICD-10-CM | POA: Diagnosis not present

## 2023-10-13 DIAGNOSIS — K7689 Other specified diseases of liver: Secondary | ICD-10-CM | POA: Diagnosis not present

## 2023-10-13 LAB — COMPREHENSIVE METABOLIC PANEL
ALT: 17 U/L (ref 0–44)
AST: 28 U/L (ref 15–41)
Albumin: 3.7 g/dL (ref 3.5–5.0)
Alkaline Phosphatase: 60 U/L (ref 38–126)
Anion gap: 10 (ref 5–15)
BUN: 17 mg/dL (ref 8–23)
CO2: 23 mmol/L (ref 22–32)
Calcium: 8.9 mg/dL (ref 8.9–10.3)
Chloride: 104 mmol/L (ref 98–111)
Creatinine, Ser: 0.77 mg/dL (ref 0.44–1.00)
GFR, Estimated: >60 mL/min (ref >60)
Glucose, Bld: 79 mg/dL (ref 70–99)
Potassium: 4 mmol/L (ref 3.5–5.1)
Sodium: 137 mmol/L (ref 135–145)
Total Bilirubin: 0.6 mg/dL (ref 0.0–1.2)
Total Protein: 6.7 g/dL (ref 6.5–8.1)

## 2023-10-13 LAB — CBC
HCT: 45.6 % (ref 36.0–46.0)
Hemoglobin: 14.3 g/dL (ref 12.0–15.0)
MCH: 31.3 pg (ref 26.0–34.0)
MCHC: 31.4 g/dL (ref 30.0–36.0)
MCV: 99.8 fL (ref 80.0–100.0)
Platelets: 162 10*3/uL (ref 150–400)
RBC: 4.57 MIL/uL (ref 3.87–5.11)
RDW: 13.2 % (ref 11.5–15.5)
WBC: 5.2 10*3/uL (ref 4.0–10.5)
nRBC: 0 % (ref 0.0–0.2)

## 2023-10-13 LAB — RESP PANEL BY RT-PCR (RSV, FLU A&B, COVID)  RVPGX2
Influenza A by PCR: NEGATIVE
Influenza B by PCR: NEGATIVE
Resp Syncytial Virus by PCR: NEGATIVE
SARS Coronavirus 2 by RT PCR: NEGATIVE

## 2023-10-13 LAB — LIPASE, BLOOD: Lipase: 31 U/L (ref 11–51)

## 2023-10-13 MED ORDER — LACTATED RINGERS IV BOLUS
1000.0000 mL | Freq: Once | INTRAVENOUS | Status: AC
  Administered 2023-10-13: 1000 mL via INTRAVENOUS

## 2023-10-13 MED ORDER — IOHEXOL 300 MG/ML  SOLN
100.0000 mL | Freq: Once | INTRAMUSCULAR | Status: AC | PRN
  Administered 2023-10-13: 100 mL via INTRAVENOUS

## 2023-10-13 NOTE — ED Notes (Signed)
 Spoke with pt in waiting room;  pt tearful and requesting to return to [facility].  Lab work reviewed; and staff made aware of pt's request.

## 2023-10-13 NOTE — ED Notes (Signed)
 Pt is present in lobby. Pt is tearful, but pt was reassured she will eventually be seen by a provider.

## 2023-10-13 NOTE — ED Provider Triage Note (Signed)
 Emergency Medicine Provider Triage Evaluation Note  [name] , a 88 y.o. female  was evaluated in triage.  Pt complains of abdominal pain, diarrhea. Sx ongoing since Sunday. No fevers. Pain RLQ. No nausea or vomiting. From [facility].   Review of Systems  Positive:  Negative:   Physical Exam  BP (!) 168/73 (BP Location: Right Arm)   Pulse 64   Temp 98 F (36.7 C) (Oral)   Resp 18   LMP 08/11/1974   SpO2 98%  Gen:   Awake, no distress   Resp:  Normal effort  MSK:   Moves extremities without difficulty  Other:    Medical Decision Making  Medically screening exam initiated at 10:57 AM.  Appropriate orders placed.  Pamla C Cech was informed that the remainder of the evaluation will be completed by another provider, this initial triage assessment does not replace that evaluation, and the importance of remaining in the ED until their evaluation is complete.     Silva Bandy, PA-C 10/13/23 1058

## 2023-10-13 NOTE — Discharge Instructions (Signed)
 Thank you for letting us evaluate you today.  We have given you fluids here in the emergency department to rehydrate you.  Your CT scan was not remarkable for any acute processes.  Your diarrhea is likely due to a virus that is spreading around nursing home.  Most importantly, I need you to maintain oral hydration with water, Pedialyte, juices, Gatorade.  If you do not eat that is okay as long as you maintain oral hydration  Return to emergency department if you experience significant worsening of symptoms, diarrhea, inability to drink fluids

## 2023-10-13 NOTE — ED Notes (Signed)
 [facility] called

## 2023-10-13 NOTE — ED Triage Notes (Signed)
 Pt arrives via [facility] from [facility] c/o diarrhea and abd pain since Sunday. Pt denies URI symptoms. States she's had poor PO intake since then. No recent fevers.

## 2023-10-13 NOTE — ED Provider Notes (Signed)
 [place] EMERGENCY DEPARTMENT AT [facility] Provider Note   CSN: [id number] Arrival date & time: 10/13/23  1018     History  Chief Complaint  Patient presents with   Diarrhea    [name] is a 88 y.o. female with past medical history of HLD, HTN, GERD, MDD presents to emergency department via PHR from [facility] for evaluation of diarrhea, nonproductive cough, nasal congestion, decreased appetite since Sunday morning.  She endorses small bouts of diarrhea about 6 times a day.  She states that other residents at [facility] screens have similar symptoms and she believes she heard that there was a "outbreak of norovirus".  She has been drinking one 20oz bottle of water a day but normally drinks a couple bottles of water.  Of note, she normally wears depends and is incontinent for past 15 years. she denies dysuria, burning with urination, urinary retention, flank pain.   Diarrhea Associated symptoms: no abdominal pain, no chills, no fever, no headaches and no vomiting       Home Medications Prior to Admission medications   Medication Sig Start Date End Date Taking? Authorizing Provider  COMBIGAN 0.2-0.5 % ophthalmic solution instill 1 drop into both eyes twice a day. 04/24/23   Shelva Majestic, MD  divalproex (DEPAKOTE) 125 MG DR tablet Take 1 tablet (125 mg total) by mouth at bedtime. 09/22/23   Shelva Majestic, MD  donepezil (ARICEPT) 10 MG tablet Take 0.5 tablets (5 mg total) by mouth at bedtime. Take one tablet daily 09/03/23   Marcos Eke, PA-C  ezetimibe-simvastatin (VYTORIN) 10-20 MG tablet TAKE ONE TABLET BY MOUTH DAILY 09/07/23   Shelva Majestic, MD  Multiple Vitamins-Minerals (MULTIVITAMIN ADULT) CHEW Chew 2 each by mouth daily. Ashby Dawes Made for Her    [provider]  nadolol (CORGARD) 20 MG tablet TAKE ONE TABLET BY MOUTH AT BEDTIME 10/02/23   Shelva Majestic, MD  nitrofurantoin (MACRODANTIN) 50 MG capsule TAKE ONE CAPSULE ONCE DAILY  10/02/23   Shelva Majestic, MD  omeprazole (PRILOSEC) 20 MG capsule Take 20 mg by mouth daily. In the morning for acid reflux over the counter    [provider]  traZODone (DESYREL) 100 MG tablet take 1/4 TO 1/2 tablet AT BEDTIME 08/10/23   Shelva Majestic, MD  venlafaxine XR (EFFEXOR-XR) 150 MG 24 hr capsule TAKE ONE CAPSULE BY MOUTH EVERY MORNING 10/02/23   Shelva Majestic, MD      Allergies    Citrate of magnesia, Codeine, and Shingrix [zoster vac recomb adjuvanted]    Review of Systems   Review of Systems  Constitutional:  Negative for chills, fatigue and fever.  Respiratory:  Negative for cough, chest tightness, shortness of breath and wheezing.   Cardiovascular:  Negative for chest pain and palpitations.  Gastrointestinal:  Positive for diarrhea. Negative for abdominal pain, constipation, nausea and vomiting.  Neurological:  Negative for dizziness, seizures, weakness, light-headedness, numbness and headaches.    Physical Exam Updated Vital Signs BP (!) 151/77 (BP Location: Right Arm)   Pulse 61   Temp (!) 97.5 F (36.4 C) (Oral)   Resp 16   LMP 08/11/1974   SpO2 96%  Physical Exam Vitals and nursing note reviewed.  Constitutional:      General: She is not in acute distress.    Appearance: Normal appearance. She is not ill-appearing.  HENT:     Head: Normocephalic and atraumatic.     Right Ear: Tympanic membrane, ear  canal and external ear normal.     Left Ear: Tympanic membrane, ear canal and external ear normal.     Nose: Congestion present.     Mouth/Throat:     Mouth: Mucous membranes are moist.     Pharynx: No oropharyngeal exudate or posterior oropharyngeal erythema.  Eyes:     General: No scleral icterus.       Right eye: No discharge.        Left eye: No discharge.     Conjunctiva/sclera: Conjunctivae normal.  Neck:     Comments: No meningismus Cardiovascular:     Rate and Rhythm: Normal rate.  Pulmonary:     Effort: Pulmonary effort is  normal. No respiratory distress.     Breath sounds: Normal breath sounds.  Chest:     Chest wall: No tenderness.  Abdominal:     General: Bowel sounds are normal. There is no distension.     Palpations: Abdomen is soft.     Tenderness: There is abdominal tenderness in the epigastric area and periumbilical area. There is no right CVA tenderness, left CVA tenderness, guarding or rebound.     Comments: Negative heeltap.  No peritoneal signs  Musculoskeletal:     Cervical back: Normal range of motion and neck supple. No rigidity or tenderness.     Right lower leg: No edema.     Left lower leg: No edema.  Skin:    Coloration: Skin is not jaundiced or pale.  Neurological:     Mental Status: She is alert and oriented to person, place, and time. Mental status is at baseline.     ED Results / Procedures / Treatments   Labs (all labs ordered are listed, but only abnormal results are displayed) Labs Reviewed  RESP PANEL BY RT-PCR (RSV, FLU A&B, COVID)  RVPGX2  LIPASE, BLOOD  COMPREHENSIVE METABOLIC PANEL  CBC  URINALYSIS, ROUTINE W REFLEX MICROSCOPIC    EKG None  Radiology CT ABDOMEN PELVIS W CONTRAST Result Date: 10/13/2023 CLINICAL DATA:  Abdominal pain and diarrhea for several days. EXAM: CT ABDOMEN AND PELVIS WITH CONTRAST TECHNIQUE: Multidetector CT imaging of the abdomen and pelvis was performed using the standard protocol following bolus administration of intravenous contrast. RADIATION DOSE REDUCTION: This exam was performed according to the departmental dose-optimization program which includes automated exposure control, adjustment of the mA and/or kV according to patient size and/or use of iterative reconstruction technique. CONTRAST:  OMNIPAQUE IOHEXOL 300 MG/ML  SOLN COMPARISON:  01/04/2021 FINDINGS: Lower Chest: No acute findings. Hepatobiliary: Stable cysts noted in right and left hepatic lobes. No suspicious hepatic masses identified. Prior cholecystectomy. No evidence of  biliary obstruction. Pancreas:  No mass or inflammatory changes. Spleen: Within normal limits in size and appearance. Adrenals/Urinary Tract: Stable sub-cm fat attenuation mass in anterior midpole right kidney, consistent with benign angiomyolipoma (No followup imaging is recommended). No evidence of ureteral calculi or hydronephrosis. Small amount of gas in bladder likely due to recent instrumentation. Stomach/Bowel: No evidence of obstruction, inflammatory process or abnormal fluid collections. Stable small left-sided lumbar hernia which contains a loop of descending colon. Vascular/Lymphatic: No pathologically enlarged lymph nodes. No acute vascular findings. Reproductive: Prior hysterectomy noted. Adnexal regions are unremarkable in appearance. Other:  None. Musculoskeletal: No suspicious bone lesions identified. Lumbar spine fusion hardware and previous L1 vertebroplasty again noted. IMPRESSION: No acute findings within the abdomen or pelvis. Stable small left-sided lumbar hernia which contains a loop of descending colon. No evidence of bowel obstruction or  other complication. Electronically Signed   By: Danae Orleans M.D.   On: 10/13/2023 18:41    Procedures Procedures    Medications Ordered in ED Medications  lactated ringers bolus 1,000 mL (0 mLs Intravenous Stopped 10/13/23 1902)  iohexol (OMNIPAQUE) 300 MG/ML solution 100 mL (100 mLs Intravenous Contrast Given 10/13/23 1603)    ED Course/ Medical Decision Making/ A&P                                 Medical Decision Making Amount and/or Complexity of Data Reviewed Labs: ordered.  Risk Prescription drug management.   Patient presents to the ED for concern of diarrhea, cough, congestion, this involves an extensive number of treatment options, and is a complaint that carries with it a high risk of complications and morbidity.  The differential diagnosis includes COVID, flu, RSV, gastroenteritis, food poisoning   Co morbidities that  complicate the patient evaluation  See HPI   Additional history obtained:  Additional history obtained from Nursing   External records from outside source obtained and reviewed including triage RN note   Lab Tests:  I Ordered, and personally interpreted labs.  The pertinent results include: Triage RN note   Imaging Studies ordered:  I ordered imaging studies including CT abdomen pelvis with contrast I independently visualized and interpreted imaging which showed no acute intra-abdominal pathology I agree with the radiologist interpretation    Medicines ordered and prescription drug management:  I ordered medication including LR for hydration Reevaluation of the patient after these medicines showed that the patient improved I have reviewed the patients home medicines and have made adjustments as needed    Problem List / ED Course:  Diarrhea Likely viral in nature.  Patient reports multiple other residents at nursing facility have similar symptoms CT negative for acute pathology Lab work grossly unremarkable with no electrolyte abnormalities, respiratory panel negative, no leukocytosis, nor anemia Provided patient with LR for hydration.  Passed p.o. challenge Discussed symptomatic care at home to include oral hydration, OTC Imodium Discussed ED workup, disposition, return to emergency department cautions with patient expresses understanding and agrees with plan.  All questions answered to her satisfaction.  She is agreeable discharge.  All recommendations provided on discharge instructions   Reevaluation:  After the interventions noted above, I reevaluated the patient and found that they have :improved   Social Determinants of Health:  Has PCP   Dispostion:  After consideration of the diagnostic results and the patients response to treatment, I feel that the patent would benefit from outpatient management with symptomatic care.    Final Clinical Impression(s)  / ED Diagnoses Final diagnoses:  Gastroenteritis  Diarrhea, unspecified type    Rx / DC Orders ED Discharge Orders     None         Judithann Sheen, PA 10/13/23 2256    Derwood Kaplan, MD 10/16/23 1100

## 2023-10-14 ENCOUNTER — Telehealth: Payer: Self-pay

## 2023-10-14 NOTE — Telephone Encounter (Signed)
 Please schedule ov for pt.  Copied from CRM [id number]. Topic: General - Other >> Oct 14, 2023 10:08 AM [name] wrote: Reason for CRM: patient is needing to speak with dr hunter she was at the hospital last night and didn't leave until 12 she said there was nothing done at the hospital

## 2023-10-14 NOTE — Telephone Encounter (Signed)
Called and spoke with pt and below message given.

## 2023-10-14 NOTE — Telephone Encounter (Signed)
 See below

## 2023-10-14 NOTE — Telephone Encounter (Signed)
 Please tell her that they did a CT scan and blood work which was reassuring which means she is safe to continue to push fluids at home.  They also gave her IV fluids but I want her to continue to push fluids as aggressively as possible.  They reported there was a norovirus outbreak which is a virus and we do not have any clear options other than supportive care of rest and hydration-she can also take Imodium if needed.  If symptoms last in the next week we can certainly do stool studies but norovirus can last several days.  If she has new or worsening symptoms she can certainly seek care again.  I am happy to see her and evaluate her but it looks like she does not feel she can get here for evaluation this week

## 2023-10-15 DIAGNOSIS — R4185 Anosognosia: Secondary | ICD-10-CM | POA: Diagnosis not present

## 2023-10-15 DIAGNOSIS — R41841 Cognitive communication deficit: Secondary | ICD-10-CM | POA: Diagnosis not present

## 2023-10-15 DIAGNOSIS — R488 Other symbolic dysfunctions: Secondary | ICD-10-CM | POA: Diagnosis not present

## 2023-10-15 DIAGNOSIS — R4789 Other speech disturbances: Secondary | ICD-10-CM | POA: Diagnosis not present

## 2023-10-16 DIAGNOSIS — R4789 Other speech disturbances: Secondary | ICD-10-CM | POA: Diagnosis not present

## 2023-10-16 DIAGNOSIS — R41841 Cognitive communication deficit: Secondary | ICD-10-CM | POA: Diagnosis not present

## 2023-10-16 DIAGNOSIS — R488 Other symbolic dysfunctions: Secondary | ICD-10-CM | POA: Diagnosis not present

## 2023-10-16 DIAGNOSIS — R4185 Anosognosia: Secondary | ICD-10-CM | POA: Diagnosis not present

## 2023-10-18 DIAGNOSIS — R4789 Other speech disturbances: Secondary | ICD-10-CM | POA: Diagnosis not present

## 2023-10-18 DIAGNOSIS — R41841 Cognitive communication deficit: Secondary | ICD-10-CM | POA: Diagnosis not present

## 2023-10-18 DIAGNOSIS — R4185 Anosognosia: Secondary | ICD-10-CM | POA: Diagnosis not present

## 2023-10-18 DIAGNOSIS — R488 Other symbolic dysfunctions: Secondary | ICD-10-CM | POA: Diagnosis not present

## 2023-10-19 DIAGNOSIS — R4789 Other speech disturbances: Secondary | ICD-10-CM | POA: Diagnosis not present

## 2023-10-19 DIAGNOSIS — R41841 Cognitive communication deficit: Secondary | ICD-10-CM | POA: Diagnosis not present

## 2023-10-19 DIAGNOSIS — R488 Other symbolic dysfunctions: Secondary | ICD-10-CM | POA: Diagnosis not present

## 2023-10-19 DIAGNOSIS — R4185 Anosognosia: Secondary | ICD-10-CM | POA: Diagnosis not present

## 2023-10-20 DIAGNOSIS — R4789 Other speech disturbances: Secondary | ICD-10-CM | POA: Diagnosis not present

## 2023-10-20 DIAGNOSIS — R4185 Anosognosia: Secondary | ICD-10-CM | POA: Diagnosis not present

## 2023-10-20 DIAGNOSIS — R41841 Cognitive communication deficit: Secondary | ICD-10-CM | POA: Diagnosis not present

## 2023-10-20 DIAGNOSIS — R488 Other symbolic dysfunctions: Secondary | ICD-10-CM | POA: Diagnosis not present

## 2023-10-21 DIAGNOSIS — R4789 Other speech disturbances: Secondary | ICD-10-CM | POA: Diagnosis not present

## 2023-10-21 DIAGNOSIS — R488 Other symbolic dysfunctions: Secondary | ICD-10-CM | POA: Diagnosis not present

## 2023-10-21 DIAGNOSIS — R41841 Cognitive communication deficit: Secondary | ICD-10-CM | POA: Diagnosis not present

## 2023-10-21 DIAGNOSIS — R4185 Anosognosia: Secondary | ICD-10-CM | POA: Diagnosis not present

## 2023-10-22 DIAGNOSIS — R300 Dysuria: Secondary | ICD-10-CM | POA: Diagnosis not present

## 2023-10-23 ENCOUNTER — Ambulatory Visit: Payer: Medicare PPO | Admitting: Psychology

## 2023-10-23 DIAGNOSIS — F411 Generalized anxiety disorder: Secondary | ICD-10-CM

## 2023-10-23 DIAGNOSIS — F325 Major depressive disorder, single episode, in full remission: Secondary | ICD-10-CM | POA: Diagnosis not present

## 2023-10-23 NOTE — Progress Notes (Addendum)
 [facility] Counselor/Therapist Progress Note  Patient ID: ROSEANNA KOPLIN, MRN: [id number]    Date: 10/23/23  Time Spent: 11:00 am -11:58 : 58 minutes  Treatment Type: Individual Therapy  Reported Symptoms: Feeling nervous and on edge, not being able to control worrying, trouble relaxing, becoming easily annoyed and irritable, feeling afraid as if something awful might happen, feeling down, feeling tired, and feeling like letting family down.   Mental Status Exam: Appearance:  Well Groomed     Behavior: Appropriate  Motor: Normal  Speech/Language:  Normal Rate  Affect: Appropriate  Mood: normal  Thought process: normal  Thought content:   WNL  Sensory/Perceptual disturbances:   WNL  Orientation: oriented to person, place, and time/date  Attention: Good  Concentration: Good  Memory: WNL  Fund of knowledge:  Good  Insight:   Good  Judgment:  Good  Impulse Control: Good   Risk Assessment: Danger to Self:  No Self-injurious Behavior: No Danger to Others: No Duty to Warn:no Physical Aggression / Violence:No  Access to Firearms a concern: No  Gang Involvement:No   Subjective:   Inayah C Dillavou participated in the session, in person in the office with the therapist, and consented to treatment Samhitha reviewed the events of the past week. Patient had been hospitalized between our last session and this session, and patient filled this writer in on that hospitalization. Patient was driven to today's appt by her long time friend Corrie Dandy. Patient did not report the concerns she had during our last session dealing with hearing or seeing "things". Patient updated this Clinical research associate with some additional history that she forgot during our last session, those included the following: Franky Macho is in [place], in rehab for drugs, both Florentina [name] and Lianne Cure are in [place], son  Ambrose Pancoast married Denny Peon, and they are the parents to the three kids. Vinnie Langton was friends with Airline pilot in college, and she called  Airline pilot when Ambrose Pancoast was divorced. "If she loves me I'll take her to dinner." Patient and patient's husband met Denny Peon and then they were married. Been together for 20+ years. Patient's mother was married 3 times. Patient's mother paid their paternal aunt $24 a week to care for patient and her sister, Britta Mccreedy, and patient's brother. Patient briefly shared some concerning family history and we will discuss that in greater detail in our next session.  We reviewed numerous treatment approaches including CBT, BA, Problem Solving, and Solution focused therapy. Psych-education regarding Cayce's diagnosis of Major depressive disorder in remission, unspecified whether recurrent (HCC) (F32.5) and Generalized Anxiety Disorder (GAD) 41.1 was provided during the session. We discussed Joshlynn C Branson's treatment goals which include engagement in consistent self-care, setting goals, symptom management, increasing mindfulness, organizing her thoughts between session and bringing those to each session. Fermina C Roussin provided verbal approval of the treatment plan.    Interventions: Psycho-education & Goal Setting.   Diagnosis:   Major depressive disorder in remission, unspecified whether recurrent (HCC) (F32.5)  Generalized Anxiety Disorder (GAD) 41.1  Psychiatric Treatment: Yes , via PCP  Treatment Plan:  Client Abilities/Strengths Mckenize is bright, expressive, verbally fluent, engaging, and very motivated for change.  Support System: Adult children and friends  Client Treatment Preferences OPT  Client Statement of Needs Teresita would like to set additional goals, do more self-care, learn more about her symptoms, increase her mindfulness, organize her thoughts in between sessions, and share her worries so she will feel better.    Treatment Level Weekly/Biweekly  Symptoms  Anxiety: feeling nervous and  on edge, not being able to control worrying, trouble relaxing, becoming easily annoyed and irritable,  feeling afraid as if something awful might happen (Status: maintained) Depression:  feeling down, feeling tired, and feeling like letting family down (Status: maintained)  Goals:   Shanikka experiences symptoms of anxiety and depression.  Treatment plan signed and available on s-drive:  Yes    Target Date: 09/07/24 Frequency: Weekly/Biweekly  Progress: 0 Modality: individual    Therapist will provide referrals for additional resources as appropriate.  Therapist will provide psycho-education regarding Caiya's diagnosis and corresponding treatment approaches and interventions. [name] will support the patient's ability to achieve the goals identified. will employ CBT, BA, Problem-solving, Solution Focused, Mindfulness,  coping skills, & other evidenced-based practices will be used to promote progress towards healthy functioning to help manage decrease symptoms associated with their diagnosis.   Reduce overall level, frequency, and intensity of the feelings of depression, anxiety and panic evidenced by decreased overall symptoms from 6 to 7 days/week to 0 to 1 days/week per client report for at least 3 consecutive months. Verbally express understanding of the relationship between feelings of depression and anxiety and their impact on thinking patterns and behaviors. Verbalize an understanding of the role that distorted thinking plays in creating fears, excessive worry, and ruminations.  (Turquoise participated in the creation of the treatment plan)    [name]

## 2023-10-27 ENCOUNTER — Ambulatory Visit: Payer: Medicare PPO | Admitting: Family Medicine

## 2023-10-27 DIAGNOSIS — R4185 Anosognosia: Secondary | ICD-10-CM | POA: Diagnosis not present

## 2023-10-27 DIAGNOSIS — R41841 Cognitive communication deficit: Secondary | ICD-10-CM | POA: Diagnosis not present

## 2023-10-27 DIAGNOSIS — R4789 Other speech disturbances: Secondary | ICD-10-CM | POA: Diagnosis not present

## 2023-10-27 DIAGNOSIS — R488 Other symbolic dysfunctions: Secondary | ICD-10-CM | POA: Diagnosis not present

## 2023-10-28 DIAGNOSIS — R488 Other symbolic dysfunctions: Secondary | ICD-10-CM | POA: Diagnosis not present

## 2023-10-28 DIAGNOSIS — R4185 Anosognosia: Secondary | ICD-10-CM | POA: Diagnosis not present

## 2023-10-28 DIAGNOSIS — R4789 Other speech disturbances: Secondary | ICD-10-CM | POA: Diagnosis not present

## 2023-10-28 DIAGNOSIS — R41841 Cognitive communication deficit: Secondary | ICD-10-CM | POA: Diagnosis not present

## 2023-10-30 ENCOUNTER — Telehealth: Payer: Self-pay

## 2023-10-30 DIAGNOSIS — R4185 Anosognosia: Secondary | ICD-10-CM | POA: Diagnosis not present

## 2023-10-30 DIAGNOSIS — R488 Other symbolic dysfunctions: Secondary | ICD-10-CM | POA: Diagnosis not present

## 2023-10-30 DIAGNOSIS — R4789 Other speech disturbances: Secondary | ICD-10-CM | POA: Diagnosis not present

## 2023-10-30 DIAGNOSIS — R41841 Cognitive communication deficit: Secondary | ICD-10-CM | POA: Diagnosis not present

## 2023-10-30 NOTE — Telephone Encounter (Signed)
 Called and lm for pt tcb.  Copied from CRM [id number]. Topic: General - Other >> Oct 30, 2023 10:34 AM Kathryne Eriksson wrote: Reason for CRM: Requesting Call Back >> Oct 30, 2023 10:36 AM Kathryne Eriksson wrote: Patient called in requesting a call back from Shelva Majestic, MD. Patient wouldn't provide any further information. Patient call back number is [phone]

## 2023-11-02 DIAGNOSIS — R4185 Anosognosia: Secondary | ICD-10-CM | POA: Diagnosis not present

## 2023-11-02 DIAGNOSIS — R41841 Cognitive communication deficit: Secondary | ICD-10-CM | POA: Diagnosis not present

## 2023-11-02 DIAGNOSIS — R488 Other symbolic dysfunctions: Secondary | ICD-10-CM | POA: Diagnosis not present

## 2023-11-02 DIAGNOSIS — R4789 Other speech disturbances: Secondary | ICD-10-CM | POA: Diagnosis not present

## 2023-11-03 ENCOUNTER — Encounter: Payer: Medicare PPO | Admitting: Psychology

## 2023-11-03 DIAGNOSIS — R42 Dizziness and giddiness: Secondary | ICD-10-CM | POA: Diagnosis not present

## 2023-11-03 DIAGNOSIS — Z9181 History of falling: Secondary | ICD-10-CM | POA: Diagnosis not present

## 2023-11-03 DIAGNOSIS — R2689 Other abnormalities of gait and mobility: Secondary | ICD-10-CM | POA: Diagnosis not present

## 2023-11-03 DIAGNOSIS — R32 Unspecified urinary incontinence: Secondary | ICD-10-CM | POA: Diagnosis not present

## 2023-11-03 DIAGNOSIS — R41841 Cognitive communication deficit: Secondary | ICD-10-CM | POA: Diagnosis not present

## 2023-11-03 DIAGNOSIS — R4185 Anosognosia: Secondary | ICD-10-CM | POA: Diagnosis not present

## 2023-11-03 DIAGNOSIS — M62551 Muscle wasting and atrophy, not elsewhere classified, right thigh: Secondary | ICD-10-CM | POA: Diagnosis not present

## 2023-11-03 DIAGNOSIS — M62552 Muscle wasting and atrophy, not elsewhere classified, left thigh: Secondary | ICD-10-CM | POA: Diagnosis not present

## 2023-11-03 DIAGNOSIS — M62511 Muscle wasting and atrophy, not elsewhere classified, right shoulder: Secondary | ICD-10-CM | POA: Diagnosis not present

## 2023-11-03 DIAGNOSIS — M62512 Muscle wasting and atrophy, not elsewhere classified, left shoulder: Secondary | ICD-10-CM | POA: Diagnosis not present

## 2023-11-03 DIAGNOSIS — R2681 Unsteadiness on feet: Secondary | ICD-10-CM | POA: Diagnosis not present

## 2023-11-03 DIAGNOSIS — R4789 Other speech disturbances: Secondary | ICD-10-CM | POA: Diagnosis not present

## 2023-11-03 DIAGNOSIS — R488 Other symbolic dysfunctions: Secondary | ICD-10-CM | POA: Diagnosis not present

## 2023-11-04 ENCOUNTER — Telehealth: Payer: Self-pay

## 2023-11-04 DIAGNOSIS — E782 Mixed hyperlipidemia: Secondary | ICD-10-CM | POA: Diagnosis not present

## 2023-11-04 DIAGNOSIS — G4700 Insomnia, unspecified: Secondary | ICD-10-CM | POA: Diagnosis not present

## 2023-11-04 DIAGNOSIS — K219 Gastro-esophageal reflux disease without esophagitis: Secondary | ICD-10-CM | POA: Diagnosis not present

## 2023-11-04 DIAGNOSIS — F331 Major depressive disorder, recurrent, moderate: Secondary | ICD-10-CM | POA: Diagnosis not present

## 2023-11-04 DIAGNOSIS — N390 Urinary tract infection, site not specified: Secondary | ICD-10-CM | POA: Diagnosis not present

## 2023-11-04 NOTE — Progress Notes (Signed)
 This encounter was created in error - please disregard.

## 2023-11-04 NOTE — Telephone Encounter (Signed)
 Called and lm for pt tcb, pt is not scheduled for 11/05/23.  Copied from CRM [id number]. Topic: General - Other >> Nov 03, 2023  3:22 PM Turkey A wrote: Reason for CRM: Patient would like to know if she can cancel her appointment for 11/05/23- would like for a nurse to call her

## 2023-11-05 ENCOUNTER — Ambulatory Visit: Admitting: Family Medicine

## 2023-11-05 DIAGNOSIS — R2689 Other abnormalities of gait and mobility: Secondary | ICD-10-CM | POA: Diagnosis not present

## 2023-11-05 DIAGNOSIS — R32 Unspecified urinary incontinence: Secondary | ICD-10-CM | POA: Diagnosis not present

## 2023-11-05 DIAGNOSIS — R42 Dizziness and giddiness: Secondary | ICD-10-CM | POA: Diagnosis not present

## 2023-11-05 DIAGNOSIS — R488 Other symbolic dysfunctions: Secondary | ICD-10-CM | POA: Diagnosis not present

## 2023-11-05 DIAGNOSIS — R2681 Unsteadiness on feet: Secondary | ICD-10-CM | POA: Diagnosis not present

## 2023-11-05 DIAGNOSIS — M62551 Muscle wasting and atrophy, not elsewhere classified, right thigh: Secondary | ICD-10-CM | POA: Diagnosis not present

## 2023-11-05 DIAGNOSIS — Z9181 History of falling: Secondary | ICD-10-CM | POA: Diagnosis not present

## 2023-11-05 DIAGNOSIS — M62511 Muscle wasting and atrophy, not elsewhere classified, right shoulder: Secondary | ICD-10-CM | POA: Diagnosis not present

## 2023-11-05 DIAGNOSIS — M62552 Muscle wasting and atrophy, not elsewhere classified, left thigh: Secondary | ICD-10-CM | POA: Diagnosis not present

## 2023-11-05 DIAGNOSIS — M62512 Muscle wasting and atrophy, not elsewhere classified, left shoulder: Secondary | ICD-10-CM | POA: Diagnosis not present

## 2023-11-06 DIAGNOSIS — M62551 Muscle wasting and atrophy, not elsewhere classified, right thigh: Secondary | ICD-10-CM | POA: Diagnosis not present

## 2023-11-06 DIAGNOSIS — R488 Other symbolic dysfunctions: Secondary | ICD-10-CM | POA: Diagnosis not present

## 2023-11-06 DIAGNOSIS — R32 Unspecified urinary incontinence: Secondary | ICD-10-CM | POA: Diagnosis not present

## 2023-11-06 DIAGNOSIS — R2689 Other abnormalities of gait and mobility: Secondary | ICD-10-CM | POA: Diagnosis not present

## 2023-11-06 DIAGNOSIS — M62512 Muscle wasting and atrophy, not elsewhere classified, left shoulder: Secondary | ICD-10-CM | POA: Diagnosis not present

## 2023-11-06 DIAGNOSIS — Z9181 History of falling: Secondary | ICD-10-CM | POA: Diagnosis not present

## 2023-11-06 DIAGNOSIS — R4185 Anosognosia: Secondary | ICD-10-CM | POA: Diagnosis not present

## 2023-11-06 DIAGNOSIS — M62552 Muscle wasting and atrophy, not elsewhere classified, left thigh: Secondary | ICD-10-CM | POA: Diagnosis not present

## 2023-11-06 DIAGNOSIS — R2681 Unsteadiness on feet: Secondary | ICD-10-CM | POA: Diagnosis not present

## 2023-11-06 DIAGNOSIS — R41841 Cognitive communication deficit: Secondary | ICD-10-CM | POA: Diagnosis not present

## 2023-11-06 DIAGNOSIS — R42 Dizziness and giddiness: Secondary | ICD-10-CM | POA: Diagnosis not present

## 2023-11-06 DIAGNOSIS — R4789 Other speech disturbances: Secondary | ICD-10-CM | POA: Diagnosis not present

## 2023-11-06 DIAGNOSIS — M62511 Muscle wasting and atrophy, not elsewhere classified, right shoulder: Secondary | ICD-10-CM | POA: Diagnosis not present

## 2023-11-09 DIAGNOSIS — R488 Other symbolic dysfunctions: Secondary | ICD-10-CM | POA: Diagnosis not present

## 2023-11-09 DIAGNOSIS — M62551 Muscle wasting and atrophy, not elsewhere classified, right thigh: Secondary | ICD-10-CM | POA: Diagnosis not present

## 2023-11-09 DIAGNOSIS — R4789 Other speech disturbances: Secondary | ICD-10-CM | POA: Diagnosis not present

## 2023-11-09 DIAGNOSIS — M62511 Muscle wasting and atrophy, not elsewhere classified, right shoulder: Secondary | ICD-10-CM | POA: Diagnosis not present

## 2023-11-09 DIAGNOSIS — R4185 Anosognosia: Secondary | ICD-10-CM | POA: Diagnosis not present

## 2023-11-09 DIAGNOSIS — Z9181 History of falling: Secondary | ICD-10-CM | POA: Diagnosis not present

## 2023-11-09 DIAGNOSIS — R2689 Other abnormalities of gait and mobility: Secondary | ICD-10-CM | POA: Diagnosis not present

## 2023-11-09 DIAGNOSIS — R2681 Unsteadiness on feet: Secondary | ICD-10-CM | POA: Diagnosis not present

## 2023-11-09 DIAGNOSIS — M62512 Muscle wasting and atrophy, not elsewhere classified, left shoulder: Secondary | ICD-10-CM | POA: Diagnosis not present

## 2023-11-09 DIAGNOSIS — R41841 Cognitive communication deficit: Secondary | ICD-10-CM | POA: Diagnosis not present

## 2023-11-09 DIAGNOSIS — R300 Dysuria: Secondary | ICD-10-CM | POA: Diagnosis not present

## 2023-11-09 DIAGNOSIS — R42 Dizziness and giddiness: Secondary | ICD-10-CM | POA: Diagnosis not present

## 2023-11-09 DIAGNOSIS — R32 Unspecified urinary incontinence: Secondary | ICD-10-CM | POA: Diagnosis not present

## 2023-11-09 DIAGNOSIS — M62552 Muscle wasting and atrophy, not elsewhere classified, left thigh: Secondary | ICD-10-CM | POA: Diagnosis not present

## 2023-11-10 ENCOUNTER — Ambulatory Visit (INDEPENDENT_AMBULATORY_CARE_PROVIDER_SITE_OTHER): Admitting: Psychology

## 2023-11-10 DIAGNOSIS — F411 Generalized anxiety disorder: Secondary | ICD-10-CM | POA: Diagnosis not present

## 2023-11-10 DIAGNOSIS — F325 Major depressive disorder, single episode, in full remission: Secondary | ICD-10-CM

## 2023-11-10 NOTE — Progress Notes (Unsigned)
  Behavioral Health Counselor/Therapist Progress Note  Patient ID: [name], MRN: [id number]    Date: 11/10/23  Time Spent: 12:00 pm - 12:55 pm : 55  minutes    Treatment Type: Individual Therapy.  Reported Symptoms: Feeling nervous and on edge, not being able to control worrying, trouble relaxing, becoming easily annoyed and irritable, feeling afraid as if something awful might happen, feeling down, feeling tired, and feeling like letting family down.     Mental Status Exam: Appearance:  Well Groomed     Behavior: Appropriate  Motor: Normal  Speech/Language:  Normal Rate  Affect: Appropriate  Mood: normal  Thought process: normal  Thought content:   WNL  Sensory/Perceptual disturbances:   WNL  Orientation: oriented to person, place, and time/date  Attention: Good  Concentration: Good  Memory: WNL  Fund of knowledge:  Good  Insight:   Good  Judgment:  Good  Impulse Control: Good   Risk Assessment: Danger to Self:  No Self-injurious Behavior: No Danger to Others: No Duty to Warn:no Physical Aggression / Violence:No  Access to Firearms a concern: No  Gang Involvement:No   Subjective:   [name] participated in the session, in person in the office with the therapist, and consented to treatment. [name] reviewed the events of the past week.   Patient was given to [name] when she was 2 months old for "temporary custody". Patient's older sister, [name], was sent to live with paternal grandmother, before patient. When [name] was almost 88 years old, she was sent to live with [name] and patient. When patient was 88 years old, and [name] was 88 years old, they had to leave [name], and move to [name] with Gabrielle biological mother (who was an alcoholic) and Gabrielle boyfriend.   When [name] started dating "this weird boy" she became pregnant. She had to get rid of it by someone who Gabrielle mother "knew".    Interventions: Cognitive Behavioral  Therapy  Diagnosis:   Major depressive disorder in remission, unspecified whether recurrent (HCC) (F32.5)  Generalized Anxiety Disorder (GAD) 41.1  Psychiatric Treatment: Yes , PCP  Treatment Plan:  Client Abilities/Strengths [name] is bright, expressive, verbally fluent, engaging, and very motivated for change.   Support System: Adult children and friends  Client Treatment Preferences OPT  Client Statement of Needs [name] would like to set additional goals, do more self-care, learn more about Gabrielle symptoms, increase Gabrielle mindfulness, organize Gabrielle thoughts in between sessions, and share Gabrielle worries so she will feel better.       Treatment Level Weekly/Biweekly  Symptoms  Anxiety: feeling nervous and on edge, not being able to control worrying, trouble relaxing, becoming easily annoyed and irritable, feeling afraid as if something awful might happen,  Depression: feeling down, feeling tired, and feeling like letting family down.    Goals:   [name] experiences symptoms of anxiety and depression.   Target Date: 09/07/24 Frequency: Weekly/Biweekly  Progress: 0 Modality: individual    Therapist will provide referrals for additional resources as appropriate.  Therapist will provide psycho-education regarding [name] diagnosis and corresponding treatment approaches and interventions. [name] will support the patient's ability to achieve the goals identified. will employ CBT, BA, Problem-solving, Solution Focused, Mindfulness,  coping skills, & other evidenced-based practices will be used to promote progress towards healthy functioning to help manage decrease symptoms associated with their diagnosis.   Reduce overall level, frequency, and intensity of the feelings of depression, anxiety and panic evidenced  by decreased overall symptoms from 6 to 7 days/week to 0 to 1 days/week per client report for at least 3 consecutive months. Verbally express understanding of the  relationship between feelings of depression and anxiety and their impact on thinking patterns and behaviors. Verbalize an understanding of the role that distorted thinking plays in creating fears, excessive worry, and ruminations.  ([name] participated in the creation of the treatment plan)    [name]

## 2023-11-11 DIAGNOSIS — R4185 Anosognosia: Secondary | ICD-10-CM | POA: Diagnosis not present

## 2023-11-11 DIAGNOSIS — R488 Other symbolic dysfunctions: Secondary | ICD-10-CM | POA: Diagnosis not present

## 2023-11-11 DIAGNOSIS — R4789 Other speech disturbances: Secondary | ICD-10-CM | POA: Diagnosis not present

## 2023-11-11 DIAGNOSIS — R41841 Cognitive communication deficit: Secondary | ICD-10-CM | POA: Diagnosis not present

## 2023-11-12 DIAGNOSIS — R4185 Anosognosia: Secondary | ICD-10-CM | POA: Diagnosis not present

## 2023-11-12 DIAGNOSIS — R4789 Other speech disturbances: Secondary | ICD-10-CM | POA: Diagnosis not present

## 2023-11-12 DIAGNOSIS — R2689 Other abnormalities of gait and mobility: Secondary | ICD-10-CM | POA: Diagnosis not present

## 2023-11-12 DIAGNOSIS — M62512 Muscle wasting and atrophy, not elsewhere classified, left shoulder: Secondary | ICD-10-CM | POA: Diagnosis not present

## 2023-11-12 DIAGNOSIS — M62511 Muscle wasting and atrophy, not elsewhere classified, right shoulder: Secondary | ICD-10-CM | POA: Diagnosis not present

## 2023-11-12 DIAGNOSIS — R41841 Cognitive communication deficit: Secondary | ICD-10-CM | POA: Diagnosis not present

## 2023-11-12 DIAGNOSIS — R42 Dizziness and giddiness: Secondary | ICD-10-CM | POA: Diagnosis not present

## 2023-11-12 DIAGNOSIS — M62551 Muscle wasting and atrophy, not elsewhere classified, right thigh: Secondary | ICD-10-CM | POA: Diagnosis not present

## 2023-11-12 DIAGNOSIS — R32 Unspecified urinary incontinence: Secondary | ICD-10-CM | POA: Diagnosis not present

## 2023-11-12 DIAGNOSIS — R2681 Unsteadiness on feet: Secondary | ICD-10-CM | POA: Diagnosis not present

## 2023-11-12 DIAGNOSIS — M62552 Muscle wasting and atrophy, not elsewhere classified, left thigh: Secondary | ICD-10-CM | POA: Diagnosis not present

## 2023-11-12 DIAGNOSIS — R488 Other symbolic dysfunctions: Secondary | ICD-10-CM | POA: Diagnosis not present

## 2023-11-12 DIAGNOSIS — Z9181 History of falling: Secondary | ICD-10-CM | POA: Diagnosis not present

## 2023-11-13 DIAGNOSIS — R32 Unspecified urinary incontinence: Secondary | ICD-10-CM | POA: Diagnosis not present

## 2023-11-13 DIAGNOSIS — R2681 Unsteadiness on feet: Secondary | ICD-10-CM | POA: Diagnosis not present

## 2023-11-13 DIAGNOSIS — R2689 Other abnormalities of gait and mobility: Secondary | ICD-10-CM | POA: Diagnosis not present

## 2023-11-13 DIAGNOSIS — R4185 Anosognosia: Secondary | ICD-10-CM | POA: Diagnosis not present

## 2023-11-13 DIAGNOSIS — M62551 Muscle wasting and atrophy, not elsewhere classified, right thigh: Secondary | ICD-10-CM | POA: Diagnosis not present

## 2023-11-13 DIAGNOSIS — M62512 Muscle wasting and atrophy, not elsewhere classified, left shoulder: Secondary | ICD-10-CM | POA: Diagnosis not present

## 2023-11-13 DIAGNOSIS — R41841 Cognitive communication deficit: Secondary | ICD-10-CM | POA: Diagnosis not present

## 2023-11-13 DIAGNOSIS — R42 Dizziness and giddiness: Secondary | ICD-10-CM | POA: Diagnosis not present

## 2023-11-13 DIAGNOSIS — R488 Other symbolic dysfunctions: Secondary | ICD-10-CM | POA: Diagnosis not present

## 2023-11-13 DIAGNOSIS — M62511 Muscle wasting and atrophy, not elsewhere classified, right shoulder: Secondary | ICD-10-CM | POA: Diagnosis not present

## 2023-11-13 DIAGNOSIS — Z9181 History of falling: Secondary | ICD-10-CM | POA: Diagnosis not present

## 2023-11-13 DIAGNOSIS — R4789 Other speech disturbances: Secondary | ICD-10-CM | POA: Diagnosis not present

## 2023-11-13 DIAGNOSIS — M62552 Muscle wasting and atrophy, not elsewhere classified, left thigh: Secondary | ICD-10-CM | POA: Diagnosis not present

## 2023-11-16 DIAGNOSIS — R488 Other symbolic dysfunctions: Secondary | ICD-10-CM | POA: Diagnosis not present

## 2023-11-16 DIAGNOSIS — M62512 Muscle wasting and atrophy, not elsewhere classified, left shoulder: Secondary | ICD-10-CM | POA: Diagnosis not present

## 2023-11-16 DIAGNOSIS — R2681 Unsteadiness on feet: Secondary | ICD-10-CM | POA: Diagnosis not present

## 2023-11-16 DIAGNOSIS — R4789 Other speech disturbances: Secondary | ICD-10-CM | POA: Diagnosis not present

## 2023-11-16 DIAGNOSIS — R2689 Other abnormalities of gait and mobility: Secondary | ICD-10-CM | POA: Diagnosis not present

## 2023-11-16 DIAGNOSIS — R4185 Anosognosia: Secondary | ICD-10-CM | POA: Diagnosis not present

## 2023-11-16 DIAGNOSIS — M62511 Muscle wasting and atrophy, not elsewhere classified, right shoulder: Secondary | ICD-10-CM | POA: Diagnosis not present

## 2023-11-16 DIAGNOSIS — R32 Unspecified urinary incontinence: Secondary | ICD-10-CM | POA: Diagnosis not present

## 2023-11-16 DIAGNOSIS — Z9181 History of falling: Secondary | ICD-10-CM | POA: Diagnosis not present

## 2023-11-16 DIAGNOSIS — R41841 Cognitive communication deficit: Secondary | ICD-10-CM | POA: Diagnosis not present

## 2023-11-16 DIAGNOSIS — M62551 Muscle wasting and atrophy, not elsewhere classified, right thigh: Secondary | ICD-10-CM | POA: Diagnosis not present

## 2023-11-16 DIAGNOSIS — R42 Dizziness and giddiness: Secondary | ICD-10-CM | POA: Diagnosis not present

## 2023-11-16 DIAGNOSIS — M62552 Muscle wasting and atrophy, not elsewhere classified, left thigh: Secondary | ICD-10-CM | POA: Diagnosis not present

## 2023-11-17 DIAGNOSIS — R488 Other symbolic dysfunctions: Secondary | ICD-10-CM | POA: Diagnosis not present

## 2023-11-17 DIAGNOSIS — M62551 Muscle wasting and atrophy, not elsewhere classified, right thigh: Secondary | ICD-10-CM | POA: Diagnosis not present

## 2023-11-17 DIAGNOSIS — R4789 Other speech disturbances: Secondary | ICD-10-CM | POA: Diagnosis not present

## 2023-11-17 DIAGNOSIS — R41841 Cognitive communication deficit: Secondary | ICD-10-CM | POA: Diagnosis not present

## 2023-11-17 DIAGNOSIS — R32 Unspecified urinary incontinence: Secondary | ICD-10-CM | POA: Diagnosis not present

## 2023-11-17 DIAGNOSIS — M62552 Muscle wasting and atrophy, not elsewhere classified, left thigh: Secondary | ICD-10-CM | POA: Diagnosis not present

## 2023-11-17 DIAGNOSIS — M62512 Muscle wasting and atrophy, not elsewhere classified, left shoulder: Secondary | ICD-10-CM | POA: Diagnosis not present

## 2023-11-17 DIAGNOSIS — M62511 Muscle wasting and atrophy, not elsewhere classified, right shoulder: Secondary | ICD-10-CM | POA: Diagnosis not present

## 2023-11-17 DIAGNOSIS — R2689 Other abnormalities of gait and mobility: Secondary | ICD-10-CM | POA: Diagnosis not present

## 2023-11-17 DIAGNOSIS — R2681 Unsteadiness on feet: Secondary | ICD-10-CM | POA: Diagnosis not present

## 2023-11-17 DIAGNOSIS — R42 Dizziness and giddiness: Secondary | ICD-10-CM | POA: Diagnosis not present

## 2023-11-17 DIAGNOSIS — R4185 Anosognosia: Secondary | ICD-10-CM | POA: Diagnosis not present

## 2023-11-17 DIAGNOSIS — Z9181 History of falling: Secondary | ICD-10-CM | POA: Diagnosis not present

## 2023-11-18 ENCOUNTER — Ambulatory Visit (INDEPENDENT_AMBULATORY_CARE_PROVIDER_SITE_OTHER): Payer: Medicare HMO

## 2023-11-18 VITALS — BP 120/68 | Ht 60.0 in | Wt 144.0 lb

## 2023-11-18 DIAGNOSIS — M62552 Muscle wasting and atrophy, not elsewhere classified, left thigh: Secondary | ICD-10-CM | POA: Diagnosis not present

## 2023-11-18 DIAGNOSIS — Z2821 Immunization not carried out because of patient refusal: Secondary | ICD-10-CM

## 2023-11-18 DIAGNOSIS — R2689 Other abnormalities of gait and mobility: Secondary | ICD-10-CM | POA: Diagnosis not present

## 2023-11-18 DIAGNOSIS — Z9181 History of falling: Secondary | ICD-10-CM | POA: Diagnosis not present

## 2023-11-18 DIAGNOSIS — R42 Dizziness and giddiness: Secondary | ICD-10-CM | POA: Diagnosis not present

## 2023-11-18 DIAGNOSIS — Z0000 Encounter for general adult medical examination without abnormal findings: Secondary | ICD-10-CM

## 2023-11-18 DIAGNOSIS — M62551 Muscle wasting and atrophy, not elsewhere classified, right thigh: Secondary | ICD-10-CM | POA: Diagnosis not present

## 2023-11-18 DIAGNOSIS — R2681 Unsteadiness on feet: Secondary | ICD-10-CM | POA: Diagnosis not present

## 2023-11-18 NOTE — Patient Instructions (Signed)
 [name] , Thank you for taking time to come for your Medicare Wellness Visit. I appreciate your ongoing commitment to your health goals. Please review the following plan we discussed and let me know if I can assist you in the future.   Referrals/Orders/Follow-Ups/Clinician Recommendations: Follow up in 1 year for next AWV  This is a list of the screening recommended for you and due dates:  Health Maintenance  Topic Date Due   Zoster (Shingles) Vaccine (2 of 2) 02/11/2017   COVID-19 Vaccine (7 - 2024-25 season) 04/12/2023   Flu Shot  03/11/2024   Medicare Annual Wellness Visit  11/17/2024   DTaP/Tdap/Td vaccine (4 - Td or Tdap) 12/18/2031   Pneumonia Vaccine  Completed   DEXA scan (bone density measurement)  Completed   HPV Vaccine  Aged Out    Advanced directives: (Declined) Advance directive discussed with you today. Even though you declined this today, please call our office should you change your mind, and we can give you the proper paperwork for you to fill out.  Next Medicare Annual Wellness Visit scheduled for next year: Yes

## 2023-11-18 NOTE — Progress Notes (Signed)
 Because this visit was a virtual/telehealth visit,  certain criteria was not obtained, such a blood pressure, CBG if applicable, and timed get up and go. Any medications not marked as "taking" were not mentioned during the medication reconciliation part of the visit. Any vitals not documented were not able to be obtained due to this being a telehealth visit or patient was unable to self-report a recent blood pressure reading due to a lack of equipment at home via telehealth. Vitals that have been documented are verbally provided by the patient.   Subjective:   [name] is a 88 y.o. who presents for a Medicare Wellness preventive visit.  Visit Complete: Virtual I connected with  [name] on 11/18/23 by a audio enabled telemedicine application and verified that I am speaking with the correct person using two identifiers.  Patient Location: Home  Provider Location: Home Office  I discussed the limitations of evaluation and management by telemedicine. The patient expressed understanding and agreed to proceed.  Vital Signs: Because this visit was a virtual/telehealth visit, some criteria may be missing or patient reported. Any vitals not documented were not able to be obtained and vitals that have been documented are patient reported.  VideoDeclined- This patient declined Librarian, academic. Therefore the visit was completed with audio only.  Persons Participating in Visit: Patient.  AWV Questionnaire: No: Patient Medicare AWV questionnaire was not completed prior to this visit.  Cardiac Risk Factors include: advanced age (>45men, >16 women);hypertension     Objective:    Today's Vitals   11/18/23 1333  BP: 120/68  Weight: 144 lb (65.3 kg)  Height: 5' (1.524 m)   Body mass index is 28.12 kg/m.     11/18/2023    1:29 PM 10/13/2023   10:31 AM 09/03/2023    2:48 PM 02/03/2023    1:06 PM 11/06/2022    9:44 AM 10/07/2022    1:42 PM 03/10/2022    11:51 AM  Advanced Directives  Does Patient Have a Medical Advance Directive? Yes Yes Yes Yes No No No  Type of Estate agent of [place];Living will Healthcare Power of [employer] Power of [employer] Power of Attorney     Does patient want to make changes to medical advance directive? No - Patient declined  No - Patient declined No - Patient declined     Copy of Healthcare Power of Attorney in Chart? No - copy requested  No - copy requested No - copy requested     Would patient like information on creating a medical advance directive?     No - Patient declined      Current Medications (verified) Outpatient Encounter Medications as of 11/18/2023  Medication Sig   cephALEXin (KEFLEX) 250 MG capsule Take 250 mg by mouth daily.   COMBIGAN 0.2-0.5 % ophthalmic solution instill 1 drop into both eyes twice a day.   divalproex (DEPAKOTE) 125 MG DR tablet Take 1 tablet (125 mg total) by mouth at bedtime.   donepezil (ARICEPT) 10 MG tablet Take 0.5 tablets (5 mg total) by mouth at bedtime. Take one tablet daily   ezetimibe-simvastatin (VYTORIN) 10-20 MG tablet TAKE ONE TABLET BY MOUTH DAILY   Multiple Vitamins-Minerals (MULTIVITAMIN ADULT) CHEW Chew 2 each by mouth daily. Nature Made for Her   nadolol (CORGARD) 20 MG tablet TAKE ONE TABLET BY MOUTH AT BEDTIME   omeprazole (PRILOSEC) 20 MG capsule Take 20 mg by mouth daily. In the morning for acid  reflux over the counter   traZODone (DESYREL) 100 MG tablet take 1/4 TO 1/2 tablet AT BEDTIME   venlafaxine XR (EFFEXOR-XR) 150 MG 24 hr capsule TAKE ONE CAPSULE BY MOUTH EVERY MORNING   nitrofurantoin (MACRODANTIN) 50 MG capsule TAKE ONE CAPSULE ONCE DAILY (Patient not taking: Reported on 11/18/2023)   No facility-administered encounter medications on file as of 11/18/2023.    Allergies (verified) Citrate of magnesia, Codeine, and Shingrix [zoster vac recomb adjuvanted]   History: Past Medical History:  Diagnosis Date    Allergic rhinitis 10/19/2007   No rx      Aortic atherosclerosis (HCC) 01/17/2021   Also with coronary calcium.  CT scan from urology 01/05/2021 ordered by Dr. Earlene Plater of [facility]   Chest pain 01/24/2018   Chronic daily headache 07/08/2017   Colon polyps    Complication of anesthesia    takes longer to wake up- admitted overnight after colonoscopy   Essential hypertension 10/19/2007   Nadolol 40mg       Generalized anxiety disorder    GERD (gastroesophageal reflux disease) 10/19/2007   prilosec 20mg  otc. Recurs if off   Glaucoma    History of COVID-19 05/2021   mild case   History of skin cancer    basal cell on nose   History of UTI (urinary tract infection)    Hypercholesteremia    Hyperlipidemia 03/14/2009   Vytorin      Lichen simplex chronicus    Liver lesion 11/10/2017   Elective MRI advised for follow up liver cyst. Planned mid 2018 after recovers from surgery   Lumbar stenosis with neurogenic claudication    Macular degeneration of right eye 03/03/2013   diagonosed by Dr. Lenny Pastel   Major depressive disorder in remission (HCC) 10/22/2021   Mild cognitive impairment with memory loss 12/04/2022   Osteoarthritis    Osteopenia of neck of right femur 03/29/2019   -1.3 worst t score 03/29/2019; also hip   PONV (postoperative nausea and vomiting)    Post-traumatic headache 10/11/2015   Saw neurology- plan was MRI but never ordered.    Scalp pain 10/22/2015   Stress incontinence 10/19/2007   2 surgeries- no improvement. Urology visits in past. May have one other surgery- holding off for now      Unsteady gait    Visual hallucinations    Wears glasses    Whooping cough    as a baby   Past Surgical History:  Procedure Laterality Date   ABDOMINAL HYSTERECTOMY  1977   ovaries remain   ANTERIOR LAT LUMBAR FUSION N/A 10/26/2017   Procedure: LUMBAR THREE- LUMBAR FOUR, LUMBAR FOUR- LUMBAR FIVE ANTEROLATERAL LUMBAR INTERBODY ARTHRODESIS;  Surgeon: Shirlean Kelly, MD;   Location: [facility] OR;  Service: Neurosurgery;  Laterality: N/A;  LUMBAR 3- LUMBAR 4, LUMBAR 4- LUMBAR 5 ANTEROLATERAL LUMBAR INTERBODY ARTHRODESIS, LUMBAR 3- LUMBAR 4, LUMBAR 4- LUMBAR 5 PERCUTANEOUS PEDICLE SCREW FIXATION   APPENDECTOMY     APPLICATION OF WOUND VAC Left 04/27/2021   Procedure: APPLICATION OF INCISIONAL WOUND VAC;  Surgeon: Terance Hart, MD;  Location: [facility] OR;  Service: Orthopedics;  Laterality: Left;   APPLICATION OF WOUND VAC Left 07/01/2021   Procedure: APPLICATION OF WOUND VAC;  Surgeon: Allena Napoleon, MD;  Location: [facility] OR;  Service: Plastics;  Laterality: Left;   BLADDER REPAIR     x two   CHOLECYSTECTOMY     COLONOSCOPY     EYE SURGERY  11/14, 12/14   Macular Dengeneration, Glaucoma   EYE SURGERY  2016   left eye   I & D EXTREMITY Left 04/27/2021   Procedure: IRRIGATION AND DEBRIDEMENT LEFT LEG;  Surgeon: Terance Hart, MD;  Location: [facility] OR;  Service: Orthopedics;  Laterality: Left;   INCISION AND DRAINAGE OF WOUND Right 04/27/2021   Procedure: IRRIGATION AND DEBRIDEMENT WOUND;  Surgeon: Terance Hart, MD;  Location: [facility] OR;  Service: Orthopedics;  Laterality: Right;   LESION REMOVAL Right 06/28/2013   Procedure: EXCISION 3 cm right labial sebaceous cyst;  Surgeon: Annamaria Boots, MD;  Location: [facility] ORS;  Service: Gynecology;  Laterality: Right;   LUMBAR LAMINECTOMY/DECOMPRESSION MICRODISCECTOMY Right 03/20/2016   Procedure: Right - Lumbar four-five lumbar laminotomy, foraminotomy, and possible microdiscectomy;  Surgeon: Shirlean Kelly, MD;  Location: [facility];  Service: Neurosurgery;  Laterality: Right;  right   LUMBAR PERCUTANEOUS PEDICLE SCREW 2 LEVEL  10/26/2017   Procedure: LUMBAR THREE- LUMBAR FOUR, LUMBAR FOUR- LUMBAR FIVE PERCUTANEOUS PEDICLE SCREW FIXATION;  Surgeon: Shirlean Kelly, MD;  Location: [facility] OR;  Service: Neurosurgery;;   MOHS SURGERY     procedure to remove basal cell   SKIN SPLIT GRAFT Left 07/01/2021   Procedure: SKIN  GRAFT SPLIT THICKNESS;  Surgeon: Allena Napoleon, MD;  Location: [facility] OR;  Service: Plastics;  Laterality: Left;  1 hour   TONSILLECTOMY AND ADENOIDECTOMY     URETHRAL SLING  2007   URETHRAL SLING  1/12   midurethral    vertebroplasty secondary to traumatic compression fracture     Family History  Problem Relation Age of Onset   Congestive Heart Failure Mother    Thyroid disease Mother    Osteoporosis Mother    Alcoholism Mother    Prostate cancer Father    Hypertension Sister    Thyroid disease Sister    Cancer Sister        brain ?   Breast cancer Maternal Grandmother    Rheum arthritis Daughter    Social History   Socioeconomic History   Marital status: Married    Spouse name: Not on file   Number of children: 2   Years of education: 14   Highest education level: Associate degree: academic program  Occupational History   Occupation: Retired  Tobacco Use   Smoking status: Former    Current packs/day: 0.00    Types: Cigarettes    Quit date: 08/11/1965    Years since quitting: 58.3   Smokeless tobacco: Never   Tobacco comments:    has not smoked in 45 years   Vaping Use   Vaping status: Never Used  Substance and Sexual Activity   Alcohol use: Not Currently    Alcohol/week: 1.0 standard drink of alcohol    Types: 1 Standard drinks or equivalent per week    Comment: occ    Drug use: No   Sexual activity: Not Currently    Partners: Male    Birth control/protection: Surgical    Comment: TAH  Other Topics Concern   Not on file  Social History Narrative   HSG, attended [employer]   Married '65   1 son '67, 1 Daughter '69; 5 grandchildren. 1 grandson dealing with drugs after divorce of parents. Oldest.    Work: retired Runner, broadcasting/film/video   Marriage in good health   Former smoker   Right-handed   Caffeine: 11/2 cups per day   Social Drivers of Health   Financial Resource Strain: Low Risk  (11/18/2023)   Overall Financial Resource Strain (CARDIA)    Difficulty  of Paying  Living Expenses: Not hard at all  Food Insecurity: No Food Insecurity (11/18/2023)   Hunger Vital Sign    Worried About Running Out of Food in the Last Year: Never true    Ran Out of Food in the Last Year: Never true  Transportation Needs: No Transportation Needs (11/18/2023)   PRAPARE - Administrator, Civil Service (Medical): No    Lack of Transportation (Non-Medical): No  Physical Activity: Sufficiently Active (11/18/2023)   Exercise Vital Sign    Days of Exercise per Week: 7 days    Minutes of Exercise per Session: 40 min  Stress: No Stress Concern Present (11/18/2023)   [name] of Occupational Health - Occupational Stress Questionnaire    Feeling of Stress : Not at all  Social Connections: Moderately Integrated (11/18/2023)   Social Connection and Isolation Panel [NHANES]    Frequency of Communication with Friends and Family: More than three times a week    Frequency of Social Gatherings with Friends and Family: More than three times a week    Attends Religious Services: More than 4 times per year    Active Member of [employer] or Organizations: No    Attends Engineer, structural: Never    Marital Status: Married    Tobacco Counseling Counseling given: Not Answered Tobacco comments: has not smoked in 45 years     Clinical Intake:  Pre-visit preparation completed: Yes  Pain : No/denies pain     BMI - recorded: 28.12 Nutritional Status: BMI 25 -29 Overweight Nutritional Risks: None Diabetes: No  Lab Results  Component Value Date   HGBA1C 5.2 01/25/2018   HGBA1C 5.3 06/12/2016   HGBA1C 5.6 10/08/2015     How often do you need to have someone help you when you read instructions, pamphlets, or other written materials from your doctor or pharmacy?: 1 - Never What is the last grade level you completed in school?: 2 years of college  Interpreter Needed?: No  Information entered by :: Isidoro Donning   Activities of Daily Living     11/18/2023     1:43 PM  In your present state of health, do you have any difficulty performing the following activities:  Hearing? 0  Vision? 0  Difficulty concentrating or making decisions? 0  Walking or climbing stairs? 0  Dressing or bathing? 0  Doing errands, shopping? 0  Preparing Food and eating ? N  Using the Toilet? N  In the past six months, have you accidently leaked urine? Y  Comment has bladder problem  Do you have problems with loss of bowel control? Y  Managing your Medications? N  Managing your Finances? N  Housekeeping or managing your Housekeeping? N    Patient Care Team: Shelva Majestic, MD as PCP - General (Family Medicine) Tonny Bollman, MD as PCP - Cardiology (Cardiology) Erroll Luna, [facility] (Inactive) (Pharmacist) Elwyn Reach (Neurology) Maris Berger, MD as Consulting Physician (Ophthalmology) [name] as Counselor (Psychology)  Indicate any recent Medical Services you may have received from other than [facility] providers in the past year (date may be approximate).     Assessment:   This is a routine wellness examination for [name].  Hearing/Vision screen Hearing Screening - Comments:: Wears hearing aids Vision Screening - Comments:: Patient wears glasses    Goals Addressed               This Visit's Progress     Patient Stated (  pt-stated)        Patient states she would like to die happily-to live as long as she can       Depression Screen     11/18/2023    1:47 PM 07/30/2023    1:21 PM 11/06/2022    9:38 AM 09/26/2022    1:33 PM 06/23/2022    3:02 PM 06/23/2022    2:32 PM 03/24/2022    2:45 PM  PHQ 2/9 Scores  PHQ - 2 Score 0 0 0 0 0 0 0  PHQ- 9 Score 0 0 0 4 1  0    Fall Risk     11/18/2023    1:42 PM 09/03/2023    2:48 PM 07/30/2023    1:20 PM 02/03/2023    1:06 PM 11/06/2022    9:45 AM  Fall Risk   Falls in the past year? 0 0 1 0 1  Number falls in past yr: 0 0 1 0 1  Injury with Fall? 0 0 1 0 1  Risk for fall  due to : No Fall Risks  History of fall(s)  Impaired balance/gait;Impaired mobility;Impaired vision;History of fall(s)  Follow up Falls prevention discussed;Falls evaluation completed Falls evaluation completed Falls evaluation completed Falls evaluation completed Falls prevention discussed    MEDICARE RISK AT HOME:  Medicare Risk at Home Any stairs in or around the home?: No If so, are there any without handrails?: No Home free of loose throw rugs in walkways, pet beds, electrical cords, etc?: Yes Adequate lighting in your home to reduce risk of falls?: Yes Life alert?: Yes Use of a cane, walker or w/c?: Yes (walker) Grab bars in the bathroom?: Yes Shower chair or bench in shower?: Yes Elevated toilet seat or a handicapped toilet?: Yes  TIMED UP AND GO:  Was the test performed?  no  Cognitive Function: 6CIT completed    09/03/2023    3:00 PM 04/01/2018    2:51 PM 03/25/2017    4:19 PM  MMSE - Mini Mental State Exam  Not completed:  --   Orientation to time 4  5  Orientation to Place 5  5  Registration 1  3  Attention/ Calculation 3  5  Recall 3  3  Language- name 2 objects 2  2  Language- repeat 1    Language- follow 3 step command 3  3  Language- read & follow direction 1  1  Write a sentence 1  1  Copy design 1  1  Total score 25        10/15/2022    3:00 PM 10/07/2022    5:00 PM  Montreal Cognitive Assessment   Visuospatial/ Executive (0/5) 3 4  Naming (0/3) 3 3  Attention: Read list of digits (0/2) 2 2  Attention: Read list of letters (0/1) 1 1  Attention: Serial 7 subtraction starting at 100 (0/3) 3 3  Language: Repeat phrase (0/2) 2 2  Language : Fluency (0/1) 1 1  Abstraction (0/2) 1 1  Delayed Recall (0/5) 4 4  Orientation (0/6) 5 5  Total 25 26  Adjusted Score (based on education) 25 26      11/18/2023    1:38 PM 11/10/2022    7:41 AM 04/23/2020   12:05 PM 04/04/2019    2:30 PM  6CIT Screen  What Year? 0 points 0 points 0 points 0 points  What month? 0  points 0 points 0 points 0 points  What time? 0 points  0 points  0 points  Count back from 20 0 points 0 points 0 points 0 points  Months in reverse 0 points 0 points 0 points 0 points  Repeat phrase 0 points 0 points 0 points 0 points  Total Score 0 points 0 points  0 points    Immunizations Immunization History  Administered Date(s) Administered   Fluad Quad(high Dose 65+) 05/26/2019, 06/18/2020, 05/15/2022   Influenza Whole 07/18/2004, 04/25/2010, 04/28/2010   Influenza, High Dose Seasonal PF 08/26/2013, 04/19/2014, 04/04/2016, 05/21/2021, 05/26/2023   Influenza-Unspecified 05/11/2012, 04/03/2015, 04/13/2017   Moderna Covid-19 Fall Seasonal Vaccine 59yrs & older 06/11/2022   PFIZER Comirnaty(Gray Top)Covid-19 Tri-Sucrose Vaccine 01/10/2021   PFIZER(Purple Top)SARS-COV-2 Vaccination 09/09/2019, 09/30/2019, 05/24/2020   Pfizer Covid-19 Vaccine Bivalent Booster 33yrs & up 05/21/2021   Pneumococcal Conjugate-13 10/08/2015   Pneumococcal Polysaccharide-23 08/11/2006   Td 03/09/2008   Tdap 04/03/2015, 12/17/2021   Zoster Recombinant(Shingrix) 12/17/2016   Zoster, Live 08/11/2009    Screening Tests Health Maintenance  Topic Date Due   Zoster Vaccines- Shingrix (2 of 2) 02/11/2017   COVID-19 Vaccine (7 - 2024-25 season) 04/12/2023   INFLUENZA VACCINE  03/11/2024   Medicare Annual Wellness (AWV)  11/17/2024   DTaP/Tdap/Td (4 - Td or Tdap) 12/18/2031   Pneumonia Vaccine 41+ Years old  Completed   DEXA SCAN  Completed   HPV VACCINES  Aged Out    Health Maintenance  Health Maintenance Due  Topic Date Due   Zoster Vaccines- Shingrix (2 of 2) 02/11/2017   COVID-19 Vaccine (7 - 2024-25 season) 04/12/2023   Health Maintenance Items Addressed:declined vaccine    Additional Screening:  Vision Screening: Recommended annual ophthalmology exams for early detection of glaucoma and other disorders of the eye.  Dental Screening: Recommended annual dental exams for proper oral  hygiene  Community Resource Referral / Chronic Care Management: CRR required this visit?  No   CCM required this visit?  No     Plan:     I have personally reviewed and noted the following in the patient's chart:   Medical and social history Use of alcohol, tobacco or illicit drugs  Current medications and supplements including opioid prescriptions. Patient is not currently taking opioid prescriptions. Functional ability and status Nutritional status Physical activity Advanced directives List of other physicians Hospitalizations, surgeries, and ER visits in previous 12 months Vitals Screenings to include cognitive, depression, and falls Referrals and appointments  In addition, I have reviewed and discussed with patient certain preventive protocols, quality metrics, and best practice recommendations. A written personalized care plan for preventive services as well as general preventive health recommendations were provided to patient.     Rudi Heap, [place]   11/18/2023   After Visit Summary: (MyChart) Due to this being a telephonic visit, the after visit summary with patients personalized plan was offered to patient via MyChart   Notes: Nothing significant to report at this time.

## 2023-11-19 DIAGNOSIS — R32 Unspecified urinary incontinence: Secondary | ICD-10-CM | POA: Diagnosis not present

## 2023-11-19 DIAGNOSIS — M62511 Muscle wasting and atrophy, not elsewhere classified, right shoulder: Secondary | ICD-10-CM | POA: Diagnosis not present

## 2023-11-19 DIAGNOSIS — R488 Other symbolic dysfunctions: Secondary | ICD-10-CM | POA: Diagnosis not present

## 2023-11-19 DIAGNOSIS — M62512 Muscle wasting and atrophy, not elsewhere classified, left shoulder: Secondary | ICD-10-CM | POA: Diagnosis not present

## 2023-11-23 ENCOUNTER — Ambulatory Visit (INDEPENDENT_AMBULATORY_CARE_PROVIDER_SITE_OTHER): Admitting: Psychology

## 2023-11-23 DIAGNOSIS — F325 Major depressive disorder, single episode, in full remission: Secondary | ICD-10-CM | POA: Diagnosis not present

## 2023-11-23 DIAGNOSIS — F411 Generalized anxiety disorder: Secondary | ICD-10-CM | POA: Diagnosis not present

## 2023-11-23 DIAGNOSIS — M62512 Muscle wasting and atrophy, not elsewhere classified, left shoulder: Secondary | ICD-10-CM | POA: Diagnosis not present

## 2023-11-23 DIAGNOSIS — R41841 Cognitive communication deficit: Secondary | ICD-10-CM | POA: Diagnosis not present

## 2023-11-23 DIAGNOSIS — R32 Unspecified urinary incontinence: Secondary | ICD-10-CM | POA: Diagnosis not present

## 2023-11-23 DIAGNOSIS — R488 Other symbolic dysfunctions: Secondary | ICD-10-CM | POA: Diagnosis not present

## 2023-11-23 DIAGNOSIS — M62511 Muscle wasting and atrophy, not elsewhere classified, right shoulder: Secondary | ICD-10-CM | POA: Diagnosis not present

## 2023-11-23 DIAGNOSIS — R4185 Anosognosia: Secondary | ICD-10-CM | POA: Diagnosis not present

## 2023-11-23 DIAGNOSIS — R4789 Other speech disturbances: Secondary | ICD-10-CM | POA: Diagnosis not present

## 2023-11-23 NOTE — Progress Notes (Signed)
 [facility] Counselor/Therapist Progress Note  Patient ID: [name], MRN: [id number]    Date: 11/23/23  Time Spent: 12:00 pm - 12:54 pm : 54 minutes    Treatment Type: Individual Therapy.  Reported Symptoms: Anxiety: eeling nervous and on edge, not being able to control worrying, trouble relaxing, becoming easily annoyed and irritable, feeling afraid as if something awful might happen. Depression: feeling down, feeling tired, and feeling like letting family down.    Mental Status Exam:  Appearance:  Well Groomed     Behavior: Appropriate  Motor: Normal  Speech/Language:  Normal Rate  Affect: Appropriate  Mood: normal  Thought process: normal  Thought content:   WNL  Sensory/Perceptual disturbances:   WNL  Orientation: oriented to person, place, and time/date  Attention: Good  Concentration: Good  Memory: WNL  Fund of knowledge:  Good  Insight:   Good  Judgment:  Good  Impulse Control: Good   Risk Assessment: Danger to Self:  No Self-injurious Behavior: No Danger to Others: No Duty to Warn:no Physical Aggression / Violence:No  Access to Firearms a concern: No  Gang Involvement:No   Subjective:   Charish C Halvorsen participated in the session, in person in the office with the therapist, and consented to treatment. Shilah reviewed the events of the past week.   Patient brought childhood pictures of herself to assist her in talking about her family of origin childhood. Patient's granddaughter, Isa Manuel, (Linda's daughter) is 56 years old, and patient reported "If Stana Ear ever takes Isa Manuel away from me, I don't know what I will do." Discussed the dynamics of using family members as leverage". Patient talked about her daughter Stana Ear and wondered about why Stana Ear was seemingly very mad at patient.   Interventions: Cognitive Behavioral Therapy  Diagnosis:   Major depressive disorder in remission, unspecified whether recurrent (HCC) (F32.5)  Generalized Anxiety  Disorder (GAD) 41.1  Psychiatric Treatment: Yes , via PCP  Treatment Plan:  Client Abilities/Strengths Noma  is bright, expressive, verbally fluent, engaging, and very motivated for change.   Support System Adult children and friends   Client Treatment Preferences OPT  Client Statement of Needs Voncille would like to set additional goals, do more self-care, learn more about her symptoms, increase her mindfulness, organize her thoughts in between sessions, and share her worries so she will feel better.    Treatment Level Weekly/Biweekly  Symptoms  Anxiety: feeling nervous and on edge, not being able to control worrying, trouble relaxing, becoming easily annoyed and irritable, feeling afraid as if something awful might happen,  Depression: feeling down, feeling tired, and feeling like letting family down.  Goals:   Satara experiences symptoms of anxiety and depression.  Target Date: 09/07/24 Frequency: Weekly/Biweekly  Progress: 0 Modality: individual    Therapist will provide referrals for additional resources as appropriate.  Therapist will provide psycho-education regarding Jayleah's diagnosis and corresponding treatment approaches and interventions. [name] will support the patient's ability to achieve the goals identified. will employ CBT, BA, Problem-solving, Solution Focused, Mindfulness,  coping skills, & other evidenced-based practices will be used to promote progress towards healthy functioning to help manage decrease symptoms associated with their diagnosis.   Reduce overall level, frequency, and intensity of the feelings of depression, anxiety and panic evidenced by decreased overall symptoms from 6 to 7 days/week to 0 to 1 days/week per client report for at least 3 consecutive months. Verbally express understanding of the relationship between feelings of depression and anxiety and their impact  on thinking patterns and behaviors. Verbalize an understanding of the  role that distorted thinking plays in creating fears, excessive worry, and ruminations.  (Preeya participated in the creation of the treatment plan)    [name]

## 2023-11-24 DIAGNOSIS — H401132 Primary open-angle glaucoma, bilateral, moderate stage: Secondary | ICD-10-CM | POA: Diagnosis not present

## 2023-11-24 DIAGNOSIS — H5203 Hypermetropia, bilateral: Secondary | ICD-10-CM | POA: Diagnosis not present

## 2023-11-24 DIAGNOSIS — Z961 Presence of intraocular lens: Secondary | ICD-10-CM | POA: Diagnosis not present

## 2023-11-24 DIAGNOSIS — M62552 Muscle wasting and atrophy, not elsewhere classified, left thigh: Secondary | ICD-10-CM | POA: Diagnosis not present

## 2023-11-24 DIAGNOSIS — M62551 Muscle wasting and atrophy, not elsewhere classified, right thigh: Secondary | ICD-10-CM | POA: Diagnosis not present

## 2023-11-24 DIAGNOSIS — H5340 Unspecified visual field defects: Secondary | ICD-10-CM | POA: Diagnosis not present

## 2023-11-24 DIAGNOSIS — R2689 Other abnormalities of gait and mobility: Secondary | ICD-10-CM | POA: Diagnosis not present

## 2023-11-24 DIAGNOSIS — R2681 Unsteadiness on feet: Secondary | ICD-10-CM | POA: Diagnosis not present

## 2023-11-24 DIAGNOSIS — Z9181 History of falling: Secondary | ICD-10-CM | POA: Diagnosis not present

## 2023-11-24 DIAGNOSIS — R42 Dizziness and giddiness: Secondary | ICD-10-CM | POA: Diagnosis not present

## 2023-11-25 DIAGNOSIS — Z9181 History of falling: Secondary | ICD-10-CM | POA: Diagnosis not present

## 2023-11-25 DIAGNOSIS — R2689 Other abnormalities of gait and mobility: Secondary | ICD-10-CM | POA: Diagnosis not present

## 2023-11-25 DIAGNOSIS — R42 Dizziness and giddiness: Secondary | ICD-10-CM | POA: Diagnosis not present

## 2023-11-25 DIAGNOSIS — R4789 Other speech disturbances: Secondary | ICD-10-CM | POA: Diagnosis not present

## 2023-11-25 DIAGNOSIS — R2681 Unsteadiness on feet: Secondary | ICD-10-CM | POA: Diagnosis not present

## 2023-11-25 DIAGNOSIS — R488 Other symbolic dysfunctions: Secondary | ICD-10-CM | POA: Diagnosis not present

## 2023-11-25 DIAGNOSIS — R4185 Anosognosia: Secondary | ICD-10-CM | POA: Diagnosis not present

## 2023-11-25 DIAGNOSIS — R41841 Cognitive communication deficit: Secondary | ICD-10-CM | POA: Diagnosis not present

## 2023-11-25 DIAGNOSIS — M62551 Muscle wasting and atrophy, not elsewhere classified, right thigh: Secondary | ICD-10-CM | POA: Diagnosis not present

## 2023-11-25 DIAGNOSIS — M62552 Muscle wasting and atrophy, not elsewhere classified, left thigh: Secondary | ICD-10-CM | POA: Diagnosis not present

## 2023-11-26 ENCOUNTER — Other Ambulatory Visit: Payer: Self-pay | Admitting: Ophthalmology

## 2023-11-26 DIAGNOSIS — M62512 Muscle wasting and atrophy, not elsewhere classified, left shoulder: Secondary | ICD-10-CM | POA: Diagnosis not present

## 2023-11-26 DIAGNOSIS — H5340 Unspecified visual field defects: Secondary | ICD-10-CM

## 2023-11-26 DIAGNOSIS — R488 Other symbolic dysfunctions: Secondary | ICD-10-CM | POA: Diagnosis not present

## 2023-11-26 DIAGNOSIS — R32 Unspecified urinary incontinence: Secondary | ICD-10-CM | POA: Diagnosis not present

## 2023-11-26 DIAGNOSIS — M62511 Muscle wasting and atrophy, not elsewhere classified, right shoulder: Secondary | ICD-10-CM | POA: Diagnosis not present

## 2023-11-27 DIAGNOSIS — M62511 Muscle wasting and atrophy, not elsewhere classified, right shoulder: Secondary | ICD-10-CM | POA: Diagnosis not present

## 2023-11-27 DIAGNOSIS — R4185 Anosognosia: Secondary | ICD-10-CM | POA: Diagnosis not present

## 2023-11-27 DIAGNOSIS — R4789 Other speech disturbances: Secondary | ICD-10-CM | POA: Diagnosis not present

## 2023-11-27 DIAGNOSIS — R41841 Cognitive communication deficit: Secondary | ICD-10-CM | POA: Diagnosis not present

## 2023-11-27 DIAGNOSIS — R32 Unspecified urinary incontinence: Secondary | ICD-10-CM | POA: Diagnosis not present

## 2023-11-27 DIAGNOSIS — R488 Other symbolic dysfunctions: Secondary | ICD-10-CM | POA: Diagnosis not present

## 2023-11-27 DIAGNOSIS — M62512 Muscle wasting and atrophy, not elsewhere classified, left shoulder: Secondary | ICD-10-CM | POA: Diagnosis not present

## 2023-11-30 DIAGNOSIS — R488 Other symbolic dysfunctions: Secondary | ICD-10-CM | POA: Diagnosis not present

## 2023-11-30 DIAGNOSIS — R32 Unspecified urinary incontinence: Secondary | ICD-10-CM | POA: Diagnosis not present

## 2023-11-30 DIAGNOSIS — M62511 Muscle wasting and atrophy, not elsewhere classified, right shoulder: Secondary | ICD-10-CM | POA: Diagnosis not present

## 2023-11-30 DIAGNOSIS — M62512 Muscle wasting and atrophy, not elsewhere classified, left shoulder: Secondary | ICD-10-CM | POA: Diagnosis not present

## 2023-11-30 DIAGNOSIS — R4789 Other speech disturbances: Secondary | ICD-10-CM | POA: Diagnosis not present

## 2023-11-30 DIAGNOSIS — R4185 Anosognosia: Secondary | ICD-10-CM | POA: Diagnosis not present

## 2023-11-30 DIAGNOSIS — R41841 Cognitive communication deficit: Secondary | ICD-10-CM | POA: Diagnosis not present

## 2023-12-02 DIAGNOSIS — M62552 Muscle wasting and atrophy, not elsewhere classified, left thigh: Secondary | ICD-10-CM | POA: Diagnosis not present

## 2023-12-02 DIAGNOSIS — M62551 Muscle wasting and atrophy, not elsewhere classified, right thigh: Secondary | ICD-10-CM | POA: Diagnosis not present

## 2023-12-02 DIAGNOSIS — R2689 Other abnormalities of gait and mobility: Secondary | ICD-10-CM | POA: Diagnosis not present

## 2023-12-02 DIAGNOSIS — R2681 Unsteadiness on feet: Secondary | ICD-10-CM | POA: Diagnosis not present

## 2023-12-02 DIAGNOSIS — Z9181 History of falling: Secondary | ICD-10-CM | POA: Diagnosis not present

## 2023-12-02 DIAGNOSIS — R488 Other symbolic dysfunctions: Secondary | ICD-10-CM | POA: Diagnosis not present

## 2023-12-02 DIAGNOSIS — M62511 Muscle wasting and atrophy, not elsewhere classified, right shoulder: Secondary | ICD-10-CM | POA: Diagnosis not present

## 2023-12-02 DIAGNOSIS — R42 Dizziness and giddiness: Secondary | ICD-10-CM | POA: Diagnosis not present

## 2023-12-02 DIAGNOSIS — M62512 Muscle wasting and atrophy, not elsewhere classified, left shoulder: Secondary | ICD-10-CM | POA: Diagnosis not present

## 2023-12-02 DIAGNOSIS — R32 Unspecified urinary incontinence: Secondary | ICD-10-CM | POA: Diagnosis not present

## 2023-12-03 DIAGNOSIS — R4185 Anosognosia: Secondary | ICD-10-CM | POA: Diagnosis not present

## 2023-12-03 DIAGNOSIS — M62551 Muscle wasting and atrophy, not elsewhere classified, right thigh: Secondary | ICD-10-CM | POA: Diagnosis not present

## 2023-12-03 DIAGNOSIS — R4789 Other speech disturbances: Secondary | ICD-10-CM | POA: Diagnosis not present

## 2023-12-03 DIAGNOSIS — R2681 Unsteadiness on feet: Secondary | ICD-10-CM | POA: Diagnosis not present

## 2023-12-03 DIAGNOSIS — R2689 Other abnormalities of gait and mobility: Secondary | ICD-10-CM | POA: Diagnosis not present

## 2023-12-03 DIAGNOSIS — M62552 Muscle wasting and atrophy, not elsewhere classified, left thigh: Secondary | ICD-10-CM | POA: Diagnosis not present

## 2023-12-03 DIAGNOSIS — R42 Dizziness and giddiness: Secondary | ICD-10-CM | POA: Diagnosis not present

## 2023-12-03 DIAGNOSIS — R32 Unspecified urinary incontinence: Secondary | ICD-10-CM | POA: Diagnosis not present

## 2023-12-03 DIAGNOSIS — R488 Other symbolic dysfunctions: Secondary | ICD-10-CM | POA: Diagnosis not present

## 2023-12-03 DIAGNOSIS — M62512 Muscle wasting and atrophy, not elsewhere classified, left shoulder: Secondary | ICD-10-CM | POA: Diagnosis not present

## 2023-12-03 DIAGNOSIS — R41841 Cognitive communication deficit: Secondary | ICD-10-CM | POA: Diagnosis not present

## 2023-12-03 DIAGNOSIS — M62511 Muscle wasting and atrophy, not elsewhere classified, right shoulder: Secondary | ICD-10-CM | POA: Diagnosis not present

## 2023-12-03 DIAGNOSIS — Z9181 History of falling: Secondary | ICD-10-CM | POA: Diagnosis not present

## 2023-12-04 DIAGNOSIS — R488 Other symbolic dysfunctions: Secondary | ICD-10-CM | POA: Diagnosis not present

## 2023-12-04 DIAGNOSIS — R4789 Other speech disturbances: Secondary | ICD-10-CM | POA: Diagnosis not present

## 2023-12-04 DIAGNOSIS — R4185 Anosognosia: Secondary | ICD-10-CM | POA: Diagnosis not present

## 2023-12-04 DIAGNOSIS — R41841 Cognitive communication deficit: Secondary | ICD-10-CM | POA: Diagnosis not present

## 2023-12-07 DIAGNOSIS — R4185 Anosognosia: Secondary | ICD-10-CM | POA: Diagnosis not present

## 2023-12-07 DIAGNOSIS — R42 Dizziness and giddiness: Secondary | ICD-10-CM | POA: Diagnosis not present

## 2023-12-07 DIAGNOSIS — M62552 Muscle wasting and atrophy, not elsewhere classified, left thigh: Secondary | ICD-10-CM | POA: Diagnosis not present

## 2023-12-07 DIAGNOSIS — R4789 Other speech disturbances: Secondary | ICD-10-CM | POA: Diagnosis not present

## 2023-12-07 DIAGNOSIS — R41841 Cognitive communication deficit: Secondary | ICD-10-CM | POA: Diagnosis not present

## 2023-12-07 DIAGNOSIS — M62551 Muscle wasting and atrophy, not elsewhere classified, right thigh: Secondary | ICD-10-CM | POA: Diagnosis not present

## 2023-12-07 DIAGNOSIS — Z9181 History of falling: Secondary | ICD-10-CM | POA: Diagnosis not present

## 2023-12-07 DIAGNOSIS — R2681 Unsteadiness on feet: Secondary | ICD-10-CM | POA: Diagnosis not present

## 2023-12-07 DIAGNOSIS — R488 Other symbolic dysfunctions: Secondary | ICD-10-CM | POA: Diagnosis not present

## 2023-12-07 DIAGNOSIS — R2689 Other abnormalities of gait and mobility: Secondary | ICD-10-CM | POA: Diagnosis not present

## 2023-12-08 DIAGNOSIS — R32 Unspecified urinary incontinence: Secondary | ICD-10-CM | POA: Diagnosis not present

## 2023-12-08 DIAGNOSIS — R4789 Other speech disturbances: Secondary | ICD-10-CM | POA: Diagnosis not present

## 2023-12-08 DIAGNOSIS — M62512 Muscle wasting and atrophy, not elsewhere classified, left shoulder: Secondary | ICD-10-CM | POA: Diagnosis not present

## 2023-12-08 DIAGNOSIS — R4185 Anosognosia: Secondary | ICD-10-CM | POA: Diagnosis not present

## 2023-12-08 DIAGNOSIS — M62511 Muscle wasting and atrophy, not elsewhere classified, right shoulder: Secondary | ICD-10-CM | POA: Diagnosis not present

## 2023-12-08 DIAGNOSIS — R41841 Cognitive communication deficit: Secondary | ICD-10-CM | POA: Diagnosis not present

## 2023-12-08 DIAGNOSIS — R488 Other symbolic dysfunctions: Secondary | ICD-10-CM | POA: Diagnosis not present

## 2023-12-09 DIAGNOSIS — R42 Dizziness and giddiness: Secondary | ICD-10-CM | POA: Diagnosis not present

## 2023-12-09 DIAGNOSIS — M62511 Muscle wasting and atrophy, not elsewhere classified, right shoulder: Secondary | ICD-10-CM | POA: Diagnosis not present

## 2023-12-09 DIAGNOSIS — R2689 Other abnormalities of gait and mobility: Secondary | ICD-10-CM | POA: Diagnosis not present

## 2023-12-09 DIAGNOSIS — R32 Unspecified urinary incontinence: Secondary | ICD-10-CM | POA: Diagnosis not present

## 2023-12-09 DIAGNOSIS — M62551 Muscle wasting and atrophy, not elsewhere classified, right thigh: Secondary | ICD-10-CM | POA: Diagnosis not present

## 2023-12-09 DIAGNOSIS — R2681 Unsteadiness on feet: Secondary | ICD-10-CM | POA: Diagnosis not present

## 2023-12-09 DIAGNOSIS — M62552 Muscle wasting and atrophy, not elsewhere classified, left thigh: Secondary | ICD-10-CM | POA: Diagnosis not present

## 2023-12-09 DIAGNOSIS — R488 Other symbolic dysfunctions: Secondary | ICD-10-CM | POA: Diagnosis not present

## 2023-12-09 DIAGNOSIS — M62512 Muscle wasting and atrophy, not elsewhere classified, left shoulder: Secondary | ICD-10-CM | POA: Diagnosis not present

## 2023-12-09 DIAGNOSIS — Z9181 History of falling: Secondary | ICD-10-CM | POA: Diagnosis not present

## 2023-12-10 DIAGNOSIS — R4789 Other speech disturbances: Secondary | ICD-10-CM | POA: Diagnosis not present

## 2023-12-10 DIAGNOSIS — R4185 Anosognosia: Secondary | ICD-10-CM | POA: Diagnosis not present

## 2023-12-10 DIAGNOSIS — R41841 Cognitive communication deficit: Secondary | ICD-10-CM | POA: Diagnosis not present

## 2023-12-10 DIAGNOSIS — R488 Other symbolic dysfunctions: Secondary | ICD-10-CM | POA: Diagnosis not present

## 2023-12-11 ENCOUNTER — Ambulatory Visit: Admitting: Psychology

## 2023-12-11 DIAGNOSIS — R488 Other symbolic dysfunctions: Secondary | ICD-10-CM | POA: Diagnosis not present

## 2023-12-11 DIAGNOSIS — M62512 Muscle wasting and atrophy, not elsewhere classified, left shoulder: Secondary | ICD-10-CM | POA: Diagnosis not present

## 2023-12-11 DIAGNOSIS — M62511 Muscle wasting and atrophy, not elsewhere classified, right shoulder: Secondary | ICD-10-CM | POA: Diagnosis not present

## 2023-12-11 DIAGNOSIS — R32 Unspecified urinary incontinence: Secondary | ICD-10-CM | POA: Diagnosis not present

## 2023-12-14 ENCOUNTER — Ambulatory Visit: Admitting: Psychology

## 2023-12-14 DIAGNOSIS — R4789 Other speech disturbances: Secondary | ICD-10-CM | POA: Diagnosis not present

## 2023-12-14 DIAGNOSIS — R488 Other symbolic dysfunctions: Secondary | ICD-10-CM | POA: Diagnosis not present

## 2023-12-14 DIAGNOSIS — R4185 Anosognosia: Secondary | ICD-10-CM | POA: Diagnosis not present

## 2023-12-14 DIAGNOSIS — R41841 Cognitive communication deficit: Secondary | ICD-10-CM | POA: Diagnosis not present

## 2023-12-14 DIAGNOSIS — M62511 Muscle wasting and atrophy, not elsewhere classified, right shoulder: Secondary | ICD-10-CM | POA: Diagnosis not present

## 2023-12-14 DIAGNOSIS — M62512 Muscle wasting and atrophy, not elsewhere classified, left shoulder: Secondary | ICD-10-CM | POA: Diagnosis not present

## 2023-12-14 DIAGNOSIS — R300 Dysuria: Secondary | ICD-10-CM | POA: Diagnosis not present

## 2023-12-14 DIAGNOSIS — R32 Unspecified urinary incontinence: Secondary | ICD-10-CM | POA: Diagnosis not present

## 2023-12-15 ENCOUNTER — Ambulatory Visit (INDEPENDENT_AMBULATORY_CARE_PROVIDER_SITE_OTHER): Admitting: Psychology

## 2023-12-15 DIAGNOSIS — F411 Generalized anxiety disorder: Secondary | ICD-10-CM | POA: Diagnosis not present

## 2023-12-15 DIAGNOSIS — R42 Dizziness and giddiness: Secondary | ICD-10-CM | POA: Diagnosis not present

## 2023-12-15 DIAGNOSIS — R2681 Unsteadiness on feet: Secondary | ICD-10-CM | POA: Diagnosis not present

## 2023-12-15 DIAGNOSIS — F325 Major depressive disorder, single episode, in full remission: Secondary | ICD-10-CM | POA: Diagnosis not present

## 2023-12-15 DIAGNOSIS — Z9181 History of falling: Secondary | ICD-10-CM | POA: Diagnosis not present

## 2023-12-15 DIAGNOSIS — M62552 Muscle wasting and atrophy, not elsewhere classified, left thigh: Secondary | ICD-10-CM | POA: Diagnosis not present

## 2023-12-15 DIAGNOSIS — M62551 Muscle wasting and atrophy, not elsewhere classified, right thigh: Secondary | ICD-10-CM | POA: Diagnosis not present

## 2023-12-15 DIAGNOSIS — R2689 Other abnormalities of gait and mobility: Secondary | ICD-10-CM | POA: Diagnosis not present

## 2023-12-15 NOTE — Progress Notes (Signed)
 [facility] Counselor/Therapist Progress Note  Patient ID: [name], MRN: [id number]    Date: 12/15/23  Time Spent: 10:06 am - 10:59 am  :53 minutes      Treatment Type: Individual Therapy.  Reported Symptoms: Anxiety and Depression  Mental Status Exam: Appearance:  Well Groomed     Behavior: Appropriate  Motor: Normal  Speech/Language:  Normal Rate  Affect: Appropriate  Mood: normal  Thought process: normal  Thought content:   WNL  Sensory/Perceptual disturbances:   WNL  Orientation: oriented to person, place, and time/date  Attention: Good  Concentration: Good  Memory: WNL  Fund of knowledge:  Good  Insight:   Good  Judgment:  Good  Impulse Control: Good   Risk Assessment: Danger to Self:  No Self-injurious Behavior: No Danger to Others: No Duty to Warn:no Physical Aggression / Violence:No  Access to Firearms a concern: No  Gang Involvement:No   Subjective:   [name] participated in the session, in person in the office with the therapist, and consented to treatment. [name] reviewed the events of the past week.   Patient reported MRI of brain scheduled for Tuesday, June 3 at 11:10 am. Patient invited her son and his wife to join her for Mother's Day lunch this weekend but patient's son has not confirmed as of yet. Patient gets teased by other patients and by [facility] staff when she shares "too much information". Patient will document in a journal her thoughts in between our sessions and will share the journal with this Clinical research associate.   Interventions: Cognitive Behavioral Therapy  Diagnosis:   Major depressive disorder in remission, unspecified whether recurrent (HCC) (F32.5)  Generalized Anxiety Disorder (GAD) 41.1  Psychiatric Treatment: Yes , PCP  Treatment Plan:  Client Abilities/Strengths Sundai is bright, expressive, verbally fluent, engaging, and very motivated for change   Support System: Adult children and  friends  Client Treatment Preferences OPT  Client Statement of Needs [name] would like to set additional goals, do more self-care, learn more about her symptoms, increase her mindfulness, organize her thoughts in between sessions, and share her worries so she will feel better.     Treatment Level Biweekly  Symptoms Anxiety: eeling nervous and on edge, not being able to control worrying, trouble relaxing, becoming easily annoyed and irritable, feeling afraid as if something awful might happen. (Status: maintained) Depression: feeling down, feeling tired, and feeling like letting family down. (Status: maintained)  Goals:   [name] experiences symptoms of anxiety and depression.   Target Date: 09/07/24 Frequency: Biweekly  Progress: 0 Modality: individual    Therapist will provide referrals for additional resources as appropriate.  Therapist will provide psycho-education regarding [name]'s diagnosis and corresponding treatment approaches and interventions. [name] will support the patient's ability to achieve the goals identified. will employ CBT, BA, Problem-solving, Solution Focused, Mindfulness,  coping skills, & other evidenced-based practices will be used to promote progress towards healthy functioning to help manage decrease symptoms associated with their diagnosis.   Reduce overall level, frequency, and intensity of the feelings of depression, anxiety and panic evidenced by decreased overall symptoms from 6 to 7 days/week to 0 to 1 days/week per client report for at least 3 consecutive months. Verbally express understanding of the relationship between feelings of depression and anxiety and their impact on thinking patterns and behaviors. Verbalize an understanding of the role that distorted thinking plays in creating fears, excessive worry, and ruminations.  ([name] participated in the creation of the  treatment plan)     [name]

## 2023-12-16 DIAGNOSIS — R32 Unspecified urinary incontinence: Secondary | ICD-10-CM | POA: Diagnosis not present

## 2023-12-16 DIAGNOSIS — R488 Other symbolic dysfunctions: Secondary | ICD-10-CM | POA: Diagnosis not present

## 2023-12-16 DIAGNOSIS — N3001 Acute cystitis with hematuria: Secondary | ICD-10-CM | POA: Diagnosis not present

## 2023-12-16 DIAGNOSIS — M62512 Muscle wasting and atrophy, not elsewhere classified, left shoulder: Secondary | ICD-10-CM | POA: Diagnosis not present

## 2023-12-16 DIAGNOSIS — M62511 Muscle wasting and atrophy, not elsewhere classified, right shoulder: Secondary | ICD-10-CM | POA: Diagnosis not present

## 2023-12-16 DIAGNOSIS — Z792 Long term (current) use of antibiotics: Secondary | ICD-10-CM | POA: Diagnosis not present

## 2023-12-17 DIAGNOSIS — M62511 Muscle wasting and atrophy, not elsewhere classified, right shoulder: Secondary | ICD-10-CM | POA: Diagnosis not present

## 2023-12-17 DIAGNOSIS — R4789 Other speech disturbances: Secondary | ICD-10-CM | POA: Diagnosis not present

## 2023-12-17 DIAGNOSIS — M62512 Muscle wasting and atrophy, not elsewhere classified, left shoulder: Secondary | ICD-10-CM | POA: Diagnosis not present

## 2023-12-17 DIAGNOSIS — R488 Other symbolic dysfunctions: Secondary | ICD-10-CM | POA: Diagnosis not present

## 2023-12-17 DIAGNOSIS — R4185 Anosognosia: Secondary | ICD-10-CM | POA: Diagnosis not present

## 2023-12-17 DIAGNOSIS — R32 Unspecified urinary incontinence: Secondary | ICD-10-CM | POA: Diagnosis not present

## 2023-12-17 DIAGNOSIS — R41841 Cognitive communication deficit: Secondary | ICD-10-CM | POA: Diagnosis not present

## 2023-12-18 DIAGNOSIS — M62512 Muscle wasting and atrophy, not elsewhere classified, left shoulder: Secondary | ICD-10-CM | POA: Diagnosis not present

## 2023-12-18 DIAGNOSIS — R488 Other symbolic dysfunctions: Secondary | ICD-10-CM | POA: Diagnosis not present

## 2023-12-18 DIAGNOSIS — R32 Unspecified urinary incontinence: Secondary | ICD-10-CM | POA: Diagnosis not present

## 2023-12-18 DIAGNOSIS — M62551 Muscle wasting and atrophy, not elsewhere classified, right thigh: Secondary | ICD-10-CM | POA: Diagnosis not present

## 2023-12-18 DIAGNOSIS — Z9181 History of falling: Secondary | ICD-10-CM | POA: Diagnosis not present

## 2023-12-18 DIAGNOSIS — M62511 Muscle wasting and atrophy, not elsewhere classified, right shoulder: Secondary | ICD-10-CM | POA: Diagnosis not present

## 2023-12-18 DIAGNOSIS — R4789 Other speech disturbances: Secondary | ICD-10-CM | POA: Diagnosis not present

## 2023-12-18 DIAGNOSIS — R42 Dizziness and giddiness: Secondary | ICD-10-CM | POA: Diagnosis not present

## 2023-12-18 DIAGNOSIS — R4185 Anosognosia: Secondary | ICD-10-CM | POA: Diagnosis not present

## 2023-12-18 DIAGNOSIS — R41841 Cognitive communication deficit: Secondary | ICD-10-CM | POA: Diagnosis not present

## 2023-12-18 DIAGNOSIS — M62552 Muscle wasting and atrophy, not elsewhere classified, left thigh: Secondary | ICD-10-CM | POA: Diagnosis not present

## 2023-12-18 DIAGNOSIS — R2681 Unsteadiness on feet: Secondary | ICD-10-CM | POA: Diagnosis not present

## 2023-12-18 DIAGNOSIS — G4700 Insomnia, unspecified: Secondary | ICD-10-CM | POA: Diagnosis not present

## 2023-12-18 DIAGNOSIS — F331 Major depressive disorder, recurrent, moderate: Secondary | ICD-10-CM | POA: Diagnosis not present

## 2023-12-18 DIAGNOSIS — R2689 Other abnormalities of gait and mobility: Secondary | ICD-10-CM | POA: Diagnosis not present

## 2023-12-21 DIAGNOSIS — M62512 Muscle wasting and atrophy, not elsewhere classified, left shoulder: Secondary | ICD-10-CM | POA: Diagnosis not present

## 2023-12-21 DIAGNOSIS — R2689 Other abnormalities of gait and mobility: Secondary | ICD-10-CM | POA: Diagnosis not present

## 2023-12-21 DIAGNOSIS — R2681 Unsteadiness on feet: Secondary | ICD-10-CM | POA: Diagnosis not present

## 2023-12-21 DIAGNOSIS — Z9181 History of falling: Secondary | ICD-10-CM | POA: Diagnosis not present

## 2023-12-21 DIAGNOSIS — R4789 Other speech disturbances: Secondary | ICD-10-CM | POA: Diagnosis not present

## 2023-12-21 DIAGNOSIS — R32 Unspecified urinary incontinence: Secondary | ICD-10-CM | POA: Diagnosis not present

## 2023-12-21 DIAGNOSIS — R488 Other symbolic dysfunctions: Secondary | ICD-10-CM | POA: Diagnosis not present

## 2023-12-21 DIAGNOSIS — M62551 Muscle wasting and atrophy, not elsewhere classified, right thigh: Secondary | ICD-10-CM | POA: Diagnosis not present

## 2023-12-21 DIAGNOSIS — R42 Dizziness and giddiness: Secondary | ICD-10-CM | POA: Diagnosis not present

## 2023-12-21 DIAGNOSIS — R4185 Anosognosia: Secondary | ICD-10-CM | POA: Diagnosis not present

## 2023-12-21 DIAGNOSIS — M62552 Muscle wasting and atrophy, not elsewhere classified, left thigh: Secondary | ICD-10-CM | POA: Diagnosis not present

## 2023-12-21 DIAGNOSIS — R41841 Cognitive communication deficit: Secondary | ICD-10-CM | POA: Diagnosis not present

## 2023-12-21 DIAGNOSIS — M62511 Muscle wasting and atrophy, not elsewhere classified, right shoulder: Secondary | ICD-10-CM | POA: Diagnosis not present

## 2023-12-22 DIAGNOSIS — R4185 Anosognosia: Secondary | ICD-10-CM | POA: Diagnosis not present

## 2023-12-22 DIAGNOSIS — M62511 Muscle wasting and atrophy, not elsewhere classified, right shoulder: Secondary | ICD-10-CM | POA: Diagnosis not present

## 2023-12-22 DIAGNOSIS — M62512 Muscle wasting and atrophy, not elsewhere classified, left shoulder: Secondary | ICD-10-CM | POA: Diagnosis not present

## 2023-12-22 DIAGNOSIS — R4789 Other speech disturbances: Secondary | ICD-10-CM | POA: Diagnosis not present

## 2023-12-22 DIAGNOSIS — R41841 Cognitive communication deficit: Secondary | ICD-10-CM | POA: Diagnosis not present

## 2023-12-22 DIAGNOSIS — R32 Unspecified urinary incontinence: Secondary | ICD-10-CM | POA: Diagnosis not present

## 2023-12-22 DIAGNOSIS — R488 Other symbolic dysfunctions: Secondary | ICD-10-CM | POA: Diagnosis not present

## 2023-12-24 DIAGNOSIS — R41841 Cognitive communication deficit: Secondary | ICD-10-CM | POA: Diagnosis not present

## 2023-12-24 DIAGNOSIS — R4789 Other speech disturbances: Secondary | ICD-10-CM | POA: Diagnosis not present

## 2023-12-24 DIAGNOSIS — R4185 Anosognosia: Secondary | ICD-10-CM | POA: Diagnosis not present

## 2023-12-24 DIAGNOSIS — R488 Other symbolic dysfunctions: Secondary | ICD-10-CM | POA: Diagnosis not present

## 2023-12-25 ENCOUNTER — Ambulatory Visit: Admitting: Psychology

## 2023-12-25 DIAGNOSIS — F411 Generalized anxiety disorder: Secondary | ICD-10-CM

## 2023-12-25 DIAGNOSIS — M62512 Muscle wasting and atrophy, not elsewhere classified, left shoulder: Secondary | ICD-10-CM | POA: Diagnosis not present

## 2023-12-25 DIAGNOSIS — R32 Unspecified urinary incontinence: Secondary | ICD-10-CM | POA: Diagnosis not present

## 2023-12-25 DIAGNOSIS — F325 Major depressive disorder, single episode, in full remission: Secondary | ICD-10-CM

## 2023-12-25 DIAGNOSIS — M62511 Muscle wasting and atrophy, not elsewhere classified, right shoulder: Secondary | ICD-10-CM | POA: Diagnosis not present

## 2023-12-25 DIAGNOSIS — R488 Other symbolic dysfunctions: Secondary | ICD-10-CM | POA: Diagnosis not present

## 2023-12-25 NOTE — Progress Notes (Signed)
 [facility] Counselor/Therapist Progress Note  Patient ID: [name], MRN: [id number]    Date: 12/25/23  Time Spent: 10:00 am - 10:55  am : 55 minutes    Treatment Type: Individual Therapy.  Reported Symptoms: Anxiety: feeling nervous and on edge, not being able to control worrying, trouble relaxing, becoming easily annoyed and irritable, feeling afraid as if something awful might happen. (Status: maintained) Depression: feeling down, feeling tired, and feeling like letting family down. (Status: maintained)  Mental Status Exam: Appearance:  Well Groomed     Behavior: Appropriate  Motor: Normal  Speech/Language:  Normal Rate  Affect: Appropriate  Mood: normal  Thought process: normal  Thought content:   WNL  Sensory/Perceptual disturbances:   WNL  Orientation: oriented to person, place, and time/date  Attention: Good  Concentration: Good  Memory: WNL  Fund of knowledge:  Good  Insight:   Good  Judgment:  Good  Impulse Control: Good   Risk Assessment: Danger to Self:  No Self-injurious Behavior: No Danger to Others: No Duty to Warn:no Physical Aggression / Violence:No  Access to Firearms a concern: No  Gang Involvement:No   Subjective:   [name] participated in the session, in person in the office with the therapist, and consented to treatment. [name] reviewed the events of the past week.   Patient had a urinary tract infection a (UTI) and patient was wondering if her UTI could cause her to think different. We discussed medical and psychological issues involved with patient's current lifestyle.  Interventions: Cognitive Behavioral Therapy  Diagnosis:   Major depressive disorder in remission, unspecified whether recurrent (HCC) (F32.5)  Generalized Anxiety Disorder (GAD) 41.1  Psychiatric Treatment:  Yes, via PCP  Treatment Plan:  Client Abilities/Strengths [name] is bright, expressive, verbally fluent, engaging, and very motivated  for change   Support System: Adult children and friends  Client Treatment Preferences OPT  Client Statement of Needs [name] would like to set additional goals, do more self-care, learn more about her symptoms, increase her mindfulness, organize her thoughts in between sessions, and share her worries so she will feel better.      Treatment Level Biweekly  Symptoms  Anxiety: feeling nervous and on edge, not being able to control worrying, trouble relaxing, becoming easily annoyed and irritable, feeling afraid as if something awful might happen. (Status: maintained) Depression: feeling down, feeling tired, and feeling like letting family down. (Status: maintained)  Goals:   [name] experiences symptoms of anxiety and depression.   Target Date: 09/07/24 Frequency: Biweekly  Progress: 0 Modality: individual    Therapist will provide referrals for additional resources as appropriate.  Therapist will provide psycho-education regarding [name]'s diagnosis and corresponding treatment approaches and interventions. [name] will support the patient's ability to achieve the goals identified. will employ CBT, BA, Problem-solving, Solution Focused, Mindfulness,  coping skills, & other evidenced-based practices will be used to promote progress towards healthy functioning to help manage decrease symptoms associated with their diagnosis.   Reduce overall level, frequency, and intensity of the feelings of depression, anxiety and panic evidenced by decreased overall symptoms from 6 to 7 days/week to 0 to 1 days/week per client report for at least 3 consecutive months. Verbally express understanding of the relationship between feelings of depression and anxiety and their impact on thinking patterns and behaviors. Verbalize an understanding of the role that distorted thinking plays in creating fears, excessive worry, and ruminations.  ([name] participated in the creation of the treatment  plan)  [name]

## 2023-12-28 DIAGNOSIS — R41841 Cognitive communication deficit: Secondary | ICD-10-CM | POA: Diagnosis not present

## 2023-12-28 DIAGNOSIS — R488 Other symbolic dysfunctions: Secondary | ICD-10-CM | POA: Diagnosis not present

## 2023-12-28 DIAGNOSIS — R4185 Anosognosia: Secondary | ICD-10-CM | POA: Diagnosis not present

## 2023-12-28 DIAGNOSIS — R4789 Other speech disturbances: Secondary | ICD-10-CM | POA: Diagnosis not present

## 2023-12-29 DIAGNOSIS — R42 Dizziness and giddiness: Secondary | ICD-10-CM | POA: Diagnosis not present

## 2023-12-29 DIAGNOSIS — M62512 Muscle wasting and atrophy, not elsewhere classified, left shoulder: Secondary | ICD-10-CM | POA: Diagnosis not present

## 2023-12-29 DIAGNOSIS — R4185 Anosognosia: Secondary | ICD-10-CM | POA: Diagnosis not present

## 2023-12-29 DIAGNOSIS — R488 Other symbolic dysfunctions: Secondary | ICD-10-CM | POA: Diagnosis not present

## 2023-12-29 DIAGNOSIS — R32 Unspecified urinary incontinence: Secondary | ICD-10-CM | POA: Diagnosis not present

## 2023-12-29 DIAGNOSIS — M62552 Muscle wasting and atrophy, not elsewhere classified, left thigh: Secondary | ICD-10-CM | POA: Diagnosis not present

## 2023-12-29 DIAGNOSIS — R2689 Other abnormalities of gait and mobility: Secondary | ICD-10-CM | POA: Diagnosis not present

## 2023-12-29 DIAGNOSIS — R2681 Unsteadiness on feet: Secondary | ICD-10-CM | POA: Diagnosis not present

## 2023-12-29 DIAGNOSIS — R4789 Other speech disturbances: Secondary | ICD-10-CM | POA: Diagnosis not present

## 2023-12-29 DIAGNOSIS — M62551 Muscle wasting and atrophy, not elsewhere classified, right thigh: Secondary | ICD-10-CM | POA: Diagnosis not present

## 2023-12-29 DIAGNOSIS — R41841 Cognitive communication deficit: Secondary | ICD-10-CM | POA: Diagnosis not present

## 2023-12-29 DIAGNOSIS — M62511 Muscle wasting and atrophy, not elsewhere classified, right shoulder: Secondary | ICD-10-CM | POA: Diagnosis not present

## 2023-12-29 DIAGNOSIS — Z9181 History of falling: Secondary | ICD-10-CM | POA: Diagnosis not present

## 2023-12-30 DIAGNOSIS — M62551 Muscle wasting and atrophy, not elsewhere classified, right thigh: Secondary | ICD-10-CM | POA: Diagnosis not present

## 2023-12-30 DIAGNOSIS — Z9181 History of falling: Secondary | ICD-10-CM | POA: Diagnosis not present

## 2023-12-30 DIAGNOSIS — N3021 Other chronic cystitis with hematuria: Secondary | ICD-10-CM | POA: Diagnosis not present

## 2023-12-30 DIAGNOSIS — M62552 Muscle wasting and atrophy, not elsewhere classified, left thigh: Secondary | ICD-10-CM | POA: Diagnosis not present

## 2023-12-30 DIAGNOSIS — R102 Pelvic and perineal pain: Secondary | ICD-10-CM | POA: Diagnosis not present

## 2023-12-30 DIAGNOSIS — R42 Dizziness and giddiness: Secondary | ICD-10-CM | POA: Diagnosis not present

## 2023-12-30 DIAGNOSIS — R2681 Unsteadiness on feet: Secondary | ICD-10-CM | POA: Diagnosis not present

## 2023-12-30 DIAGNOSIS — R488 Other symbolic dysfunctions: Secondary | ICD-10-CM | POA: Diagnosis not present

## 2023-12-30 DIAGNOSIS — M62511 Muscle wasting and atrophy, not elsewhere classified, right shoulder: Secondary | ICD-10-CM | POA: Diagnosis not present

## 2023-12-30 DIAGNOSIS — R32 Unspecified urinary incontinence: Secondary | ICD-10-CM | POA: Diagnosis not present

## 2023-12-30 DIAGNOSIS — M62512 Muscle wasting and atrophy, not elsewhere classified, left shoulder: Secondary | ICD-10-CM | POA: Diagnosis not present

## 2023-12-30 DIAGNOSIS — R2689 Other abnormalities of gait and mobility: Secondary | ICD-10-CM | POA: Diagnosis not present

## 2023-12-31 DIAGNOSIS — R32 Unspecified urinary incontinence: Secondary | ICD-10-CM | POA: Diagnosis not present

## 2023-12-31 DIAGNOSIS — M62511 Muscle wasting and atrophy, not elsewhere classified, right shoulder: Secondary | ICD-10-CM | POA: Diagnosis not present

## 2023-12-31 DIAGNOSIS — M62512 Muscle wasting and atrophy, not elsewhere classified, left shoulder: Secondary | ICD-10-CM | POA: Diagnosis not present

## 2023-12-31 DIAGNOSIS — R488 Other symbolic dysfunctions: Secondary | ICD-10-CM | POA: Diagnosis not present

## 2024-01-01 DIAGNOSIS — R488 Other symbolic dysfunctions: Secondary | ICD-10-CM | POA: Diagnosis not present

## 2024-01-01 DIAGNOSIS — R41841 Cognitive communication deficit: Secondary | ICD-10-CM | POA: Diagnosis not present

## 2024-01-01 DIAGNOSIS — R4789 Other speech disturbances: Secondary | ICD-10-CM | POA: Diagnosis not present

## 2024-01-01 DIAGNOSIS — R4185 Anosognosia: Secondary | ICD-10-CM | POA: Diagnosis not present

## 2024-01-03 DIAGNOSIS — R4185 Anosognosia: Secondary | ICD-10-CM | POA: Diagnosis not present

## 2024-01-03 DIAGNOSIS — R41841 Cognitive communication deficit: Secondary | ICD-10-CM | POA: Diagnosis not present

## 2024-01-03 DIAGNOSIS — R4789 Other speech disturbances: Secondary | ICD-10-CM | POA: Diagnosis not present

## 2024-01-03 DIAGNOSIS — R488 Other symbolic dysfunctions: Secondary | ICD-10-CM | POA: Diagnosis not present

## 2024-01-05 ENCOUNTER — Ambulatory Visit (INDEPENDENT_AMBULATORY_CARE_PROVIDER_SITE_OTHER): Admitting: Psychology

## 2024-01-05 DIAGNOSIS — F411 Generalized anxiety disorder: Secondary | ICD-10-CM | POA: Diagnosis not present

## 2024-01-05 DIAGNOSIS — F325 Major depressive disorder, single episode, in full remission: Secondary | ICD-10-CM

## 2024-01-05 DIAGNOSIS — M62512 Muscle wasting and atrophy, not elsewhere classified, left shoulder: Secondary | ICD-10-CM | POA: Diagnosis not present

## 2024-01-05 DIAGNOSIS — R4789 Other speech disturbances: Secondary | ICD-10-CM | POA: Diagnosis not present

## 2024-01-05 DIAGNOSIS — M62511 Muscle wasting and atrophy, not elsewhere classified, right shoulder: Secondary | ICD-10-CM | POA: Diagnosis not present

## 2024-01-05 DIAGNOSIS — R41841 Cognitive communication deficit: Secondary | ICD-10-CM | POA: Diagnosis not present

## 2024-01-05 DIAGNOSIS — R4185 Anosognosia: Secondary | ICD-10-CM | POA: Diagnosis not present

## 2024-01-05 DIAGNOSIS — R488 Other symbolic dysfunctions: Secondary | ICD-10-CM | POA: Diagnosis not present

## 2024-01-05 DIAGNOSIS — R32 Unspecified urinary incontinence: Secondary | ICD-10-CM | POA: Diagnosis not present

## 2024-01-05 NOTE — Progress Notes (Signed)
 [facility] Counselor/Therapist Progress Note  Patient ID: [name], MRN: [id number]    Date: 01/05/24  Time Spent: 12:00 pm - 12:54  : 54 minutes  Treatment Type: Individual Therapy.  Reported Symptoms: Anxiety: feeling nervous and on edge, not being able to control worrying, trouble relaxing, becoming easily annoyed and irritable, feeling afraid as if something awful might happen. (Status: maintained) Depression: feeling down, feeling tired, and feeling like letting family down. (Status: maintained)  Mental Status Exam: Appearance:  Well Groomed     Behavior: Appropriate  Motor: Normal  Speech/Language:  Normal Rate  Affect: Appropriate  Mood: normal  Thought process: normal  Thought content:   WNL  Sensory/Perceptual disturbances:   WNL  Orientation: oriented to person, place, and time/date  Attention: Good  Concentration: Good  Memory: WNL  Fund of knowledge:  Good  Insight:   Good  Judgment:  Good  Impulse Control: Good   Risk Assessment: Danger to Self:  No Self-injurious Behavior: No Danger to Others: No Duty to Warn:no Physical Aggression / Violence:No  Access to Firearms a concern: No  Gang Involvement:No   Subjective:   [name] participated in the session, in person in the office with the therapist, and consented to treatment. [name] reviewed the events of the past week.   Patient talked about her concerning relationship with her daughter, [name]. Patient would like to figure out why her daughter, [name], treated patient and [name] so different. Patient's gandson ([name]'s wife) and grandaughter, [name], are missed by patient.   Patient has a brain MRI scheduled for June 4.    Interventions: Cognitive Behavioral Therapy  Diagnosis: Major depressive disorder in remission, unspecified whether recurrent (HCC) (F32.5), Generalized Anxiety Disorder (GAD) 41.1   Psychiatric Treatment: Yes , via PCP  Treatment Plan:  Client  Abilities/Strengths [name] is bright, expressive, verbally fluent, engaging, and very motivated for change   Support System: Adult children and friends.  Client Treatment Preferences OPT  Client Statement of Needs [name] would like to  set additional goals, do more self-care, learn more about her symptoms, increase her mindfulness, organize her thoughts in between sessions, and share her worries so she will feel better.         Treatment Level Biweekly  Symptoms  Goals:   [name] experiences symptoms of anxiety and depression.   Target Date: 09/07/24 Frequency: Biweekly  Progress: 0 Modality: individual    Therapist will provide referrals for additional resources as appropriate.  Therapist will provide psycho-education regarding [name]'s diagnosis and corresponding treatment approaches and interventions. [name] will support the patient's ability to achieve the goals identified. will employ CBT, BA, Problem-solving, Solution Focused, Mindfulness,  coping skills, & other evidenced-based practices will be used to promote progress towards healthy functioning to help manage decrease symptoms associated with their diagnosis.   Reduce overall level, frequency, and intensity of the feelings of depression, anxiety and panic evidenced by decreased overall symptoms from 6 to 7 days/week to 0 to 1 days/week per client report for at least 3 consecutive months. Verbally express understanding of the relationship between feelings of depression and anxiety and their impact on thinking patterns and behaviors. Verbalize an understanding of the role that distorted thinking plays in creating fears, excessive worry, and ruminations.  ([name] participated in the creation of the treatment plan)    [name]

## 2024-01-06 ENCOUNTER — Encounter: Payer: Self-pay | Admitting: Physician Assistant

## 2024-01-06 DIAGNOSIS — Z9181 History of falling: Secondary | ICD-10-CM | POA: Diagnosis not present

## 2024-01-06 DIAGNOSIS — R2689 Other abnormalities of gait and mobility: Secondary | ICD-10-CM | POA: Diagnosis not present

## 2024-01-06 DIAGNOSIS — R4789 Other speech disturbances: Secondary | ICD-10-CM | POA: Diagnosis not present

## 2024-01-06 DIAGNOSIS — R4185 Anosognosia: Secondary | ICD-10-CM | POA: Diagnosis not present

## 2024-01-06 DIAGNOSIS — R488 Other symbolic dysfunctions: Secondary | ICD-10-CM | POA: Diagnosis not present

## 2024-01-06 DIAGNOSIS — R2681 Unsteadiness on feet: Secondary | ICD-10-CM | POA: Diagnosis not present

## 2024-01-06 DIAGNOSIS — M62512 Muscle wasting and atrophy, not elsewhere classified, left shoulder: Secondary | ICD-10-CM | POA: Diagnosis not present

## 2024-01-06 DIAGNOSIS — M62552 Muscle wasting and atrophy, not elsewhere classified, left thigh: Secondary | ICD-10-CM | POA: Diagnosis not present

## 2024-01-06 DIAGNOSIS — R42 Dizziness and giddiness: Secondary | ICD-10-CM | POA: Diagnosis not present

## 2024-01-06 DIAGNOSIS — R32 Unspecified urinary incontinence: Secondary | ICD-10-CM | POA: Diagnosis not present

## 2024-01-06 DIAGNOSIS — R41841 Cognitive communication deficit: Secondary | ICD-10-CM | POA: Diagnosis not present

## 2024-01-06 DIAGNOSIS — M62551 Muscle wasting and atrophy, not elsewhere classified, right thigh: Secondary | ICD-10-CM | POA: Diagnosis not present

## 2024-01-06 DIAGNOSIS — M62511 Muscle wasting and atrophy, not elsewhere classified, right shoulder: Secondary | ICD-10-CM | POA: Diagnosis not present

## 2024-01-07 DIAGNOSIS — M62551 Muscle wasting and atrophy, not elsewhere classified, right thigh: Secondary | ICD-10-CM | POA: Diagnosis not present

## 2024-01-07 DIAGNOSIS — R2689 Other abnormalities of gait and mobility: Secondary | ICD-10-CM | POA: Diagnosis not present

## 2024-01-07 DIAGNOSIS — M62552 Muscle wasting and atrophy, not elsewhere classified, left thigh: Secondary | ICD-10-CM | POA: Diagnosis not present

## 2024-01-07 DIAGNOSIS — Z9181 History of falling: Secondary | ICD-10-CM | POA: Diagnosis not present

## 2024-01-07 DIAGNOSIS — R2681 Unsteadiness on feet: Secondary | ICD-10-CM | POA: Diagnosis not present

## 2024-01-07 DIAGNOSIS — R42 Dizziness and giddiness: Secondary | ICD-10-CM | POA: Diagnosis not present

## 2024-01-08 ENCOUNTER — Ambulatory Visit: Admitting: Psychology

## 2024-01-08 DIAGNOSIS — M62511 Muscle wasting and atrophy, not elsewhere classified, right shoulder: Secondary | ICD-10-CM | POA: Diagnosis not present

## 2024-01-08 DIAGNOSIS — R32 Unspecified urinary incontinence: Secondary | ICD-10-CM | POA: Diagnosis not present

## 2024-01-08 DIAGNOSIS — R488 Other symbolic dysfunctions: Secondary | ICD-10-CM | POA: Diagnosis not present

## 2024-01-08 DIAGNOSIS — M62512 Muscle wasting and atrophy, not elsewhere classified, left shoulder: Secondary | ICD-10-CM | POA: Diagnosis not present

## 2024-01-11 DIAGNOSIS — M62511 Muscle wasting and atrophy, not elsewhere classified, right shoulder: Secondary | ICD-10-CM | POA: Diagnosis not present

## 2024-01-11 DIAGNOSIS — R488 Other symbolic dysfunctions: Secondary | ICD-10-CM | POA: Diagnosis not present

## 2024-01-11 DIAGNOSIS — M62512 Muscle wasting and atrophy, not elsewhere classified, left shoulder: Secondary | ICD-10-CM | POA: Diagnosis not present

## 2024-01-11 DIAGNOSIS — R32 Unspecified urinary incontinence: Secondary | ICD-10-CM | POA: Diagnosis not present

## 2024-01-12 ENCOUNTER — Ambulatory Visit
Admission: RE | Admit: 2024-01-12 | Discharge: 2024-01-12 | Disposition: A | Discharge status: Home / Self Care | Source: Ambulatory Visit | Attending: Ophthalmology | Admitting: Ophthalmology

## 2024-01-12 DIAGNOSIS — H5340 Unspecified visual field defects: Secondary | ICD-10-CM

## 2024-01-12 DIAGNOSIS — I6782 Cerebral ischemia: Secondary | ICD-10-CM | POA: Diagnosis not present

## 2024-01-12 DIAGNOSIS — G319 Degenerative disease of nervous system, unspecified: Secondary | ICD-10-CM | POA: Diagnosis not present

## 2024-01-12 MED ORDER — GADOPICLENOL 0.5 MMOL/ML IV SOLN
7.0000 mL | Freq: Once | INTRAVENOUS | Status: AC | PRN
  Administered 2024-01-12: 7 mL via INTRAVENOUS

## 2024-01-13 DIAGNOSIS — R32 Unspecified urinary incontinence: Secondary | ICD-10-CM | POA: Diagnosis not present

## 2024-01-13 DIAGNOSIS — M62512 Muscle wasting and atrophy, not elsewhere classified, left shoulder: Secondary | ICD-10-CM | POA: Diagnosis not present

## 2024-01-13 DIAGNOSIS — R4789 Other speech disturbances: Secondary | ICD-10-CM | POA: Diagnosis not present

## 2024-01-13 DIAGNOSIS — R4185 Anosognosia: Secondary | ICD-10-CM | POA: Diagnosis not present

## 2024-01-13 DIAGNOSIS — R488 Other symbolic dysfunctions: Secondary | ICD-10-CM | POA: Diagnosis not present

## 2024-01-13 DIAGNOSIS — R41841 Cognitive communication deficit: Secondary | ICD-10-CM | POA: Diagnosis not present

## 2024-01-13 DIAGNOSIS — M62511 Muscle wasting and atrophy, not elsewhere classified, right shoulder: Secondary | ICD-10-CM | POA: Diagnosis not present

## 2024-01-14 DIAGNOSIS — R4789 Other speech disturbances: Secondary | ICD-10-CM | POA: Diagnosis not present

## 2024-01-14 DIAGNOSIS — R42 Dizziness and giddiness: Secondary | ICD-10-CM | POA: Diagnosis not present

## 2024-01-14 DIAGNOSIS — R2689 Other abnormalities of gait and mobility: Secondary | ICD-10-CM | POA: Diagnosis not present

## 2024-01-14 DIAGNOSIS — R2681 Unsteadiness on feet: Secondary | ICD-10-CM | POA: Diagnosis not present

## 2024-01-14 DIAGNOSIS — M62551 Muscle wasting and atrophy, not elsewhere classified, right thigh: Secondary | ICD-10-CM | POA: Diagnosis not present

## 2024-01-14 DIAGNOSIS — M62552 Muscle wasting and atrophy, not elsewhere classified, left thigh: Secondary | ICD-10-CM | POA: Diagnosis not present

## 2024-01-14 DIAGNOSIS — R488 Other symbolic dysfunctions: Secondary | ICD-10-CM | POA: Diagnosis not present

## 2024-01-14 DIAGNOSIS — Z9181 History of falling: Secondary | ICD-10-CM | POA: Diagnosis not present

## 2024-01-14 DIAGNOSIS — M62511 Muscle wasting and atrophy, not elsewhere classified, right shoulder: Secondary | ICD-10-CM | POA: Diagnosis not present

## 2024-01-14 DIAGNOSIS — R41841 Cognitive communication deficit: Secondary | ICD-10-CM | POA: Diagnosis not present

## 2024-01-14 DIAGNOSIS — M62512 Muscle wasting and atrophy, not elsewhere classified, left shoulder: Secondary | ICD-10-CM | POA: Diagnosis not present

## 2024-01-14 DIAGNOSIS — R4185 Anosognosia: Secondary | ICD-10-CM | POA: Diagnosis not present

## 2024-01-14 DIAGNOSIS — R32 Unspecified urinary incontinence: Secondary | ICD-10-CM | POA: Diagnosis not present

## 2024-01-15 DIAGNOSIS — R4185 Anosognosia: Secondary | ICD-10-CM | POA: Diagnosis not present

## 2024-01-15 DIAGNOSIS — M62552 Muscle wasting and atrophy, not elsewhere classified, left thigh: Secondary | ICD-10-CM | POA: Diagnosis not present

## 2024-01-15 DIAGNOSIS — R4789 Other speech disturbances: Secondary | ICD-10-CM | POA: Diagnosis not present

## 2024-01-15 DIAGNOSIS — R2689 Other abnormalities of gait and mobility: Secondary | ICD-10-CM | POA: Diagnosis not present

## 2024-01-15 DIAGNOSIS — R2681 Unsteadiness on feet: Secondary | ICD-10-CM | POA: Diagnosis not present

## 2024-01-15 DIAGNOSIS — Z9181 History of falling: Secondary | ICD-10-CM | POA: Diagnosis not present

## 2024-01-15 DIAGNOSIS — M62551 Muscle wasting and atrophy, not elsewhere classified, right thigh: Secondary | ICD-10-CM | POA: Diagnosis not present

## 2024-01-15 DIAGNOSIS — R42 Dizziness and giddiness: Secondary | ICD-10-CM | POA: Diagnosis not present

## 2024-01-15 DIAGNOSIS — R41841 Cognitive communication deficit: Secondary | ICD-10-CM | POA: Diagnosis not present

## 2024-01-15 DIAGNOSIS — R488 Other symbolic dysfunctions: Secondary | ICD-10-CM | POA: Diagnosis not present

## 2024-01-18 DIAGNOSIS — R4185 Anosognosia: Secondary | ICD-10-CM | POA: Diagnosis not present

## 2024-01-18 DIAGNOSIS — R4789 Other speech disturbances: Secondary | ICD-10-CM | POA: Diagnosis not present

## 2024-01-18 DIAGNOSIS — R32 Unspecified urinary incontinence: Secondary | ICD-10-CM | POA: Diagnosis not present

## 2024-01-18 DIAGNOSIS — R41841 Cognitive communication deficit: Secondary | ICD-10-CM | POA: Diagnosis not present

## 2024-01-18 DIAGNOSIS — M62511 Muscle wasting and atrophy, not elsewhere classified, right shoulder: Secondary | ICD-10-CM | POA: Diagnosis not present

## 2024-01-18 DIAGNOSIS — R488 Other symbolic dysfunctions: Secondary | ICD-10-CM | POA: Diagnosis not present

## 2024-01-18 DIAGNOSIS — M62512 Muscle wasting and atrophy, not elsewhere classified, left shoulder: Secondary | ICD-10-CM | POA: Diagnosis not present

## 2024-01-19 DIAGNOSIS — R32 Unspecified urinary incontinence: Secondary | ICD-10-CM | POA: Diagnosis not present

## 2024-01-19 DIAGNOSIS — R488 Other symbolic dysfunctions: Secondary | ICD-10-CM | POA: Diagnosis not present

## 2024-01-19 DIAGNOSIS — M62512 Muscle wasting and atrophy, not elsewhere classified, left shoulder: Secondary | ICD-10-CM | POA: Diagnosis not present

## 2024-01-19 DIAGNOSIS — M62511 Muscle wasting and atrophy, not elsewhere classified, right shoulder: Secondary | ICD-10-CM | POA: Diagnosis not present

## 2024-01-20 DIAGNOSIS — Z9181 History of falling: Secondary | ICD-10-CM | POA: Diagnosis not present

## 2024-01-20 DIAGNOSIS — R2681 Unsteadiness on feet: Secondary | ICD-10-CM | POA: Diagnosis not present

## 2024-01-20 DIAGNOSIS — R42 Dizziness and giddiness: Secondary | ICD-10-CM | POA: Diagnosis not present

## 2024-01-20 DIAGNOSIS — M62551 Muscle wasting and atrophy, not elsewhere classified, right thigh: Secondary | ICD-10-CM | POA: Diagnosis not present

## 2024-01-20 DIAGNOSIS — R4789 Other speech disturbances: Secondary | ICD-10-CM | POA: Diagnosis not present

## 2024-01-20 DIAGNOSIS — R488 Other symbolic dysfunctions: Secondary | ICD-10-CM | POA: Diagnosis not present

## 2024-01-20 DIAGNOSIS — R4185 Anosognosia: Secondary | ICD-10-CM | POA: Diagnosis not present

## 2024-01-20 DIAGNOSIS — R41841 Cognitive communication deficit: Secondary | ICD-10-CM | POA: Diagnosis not present

## 2024-01-20 DIAGNOSIS — M62552 Muscle wasting and atrophy, not elsewhere classified, left thigh: Secondary | ICD-10-CM | POA: Diagnosis not present

## 2024-01-20 DIAGNOSIS — R2689 Other abnormalities of gait and mobility: Secondary | ICD-10-CM | POA: Diagnosis not present

## 2024-01-21 DIAGNOSIS — R488 Other symbolic dysfunctions: Secondary | ICD-10-CM | POA: Diagnosis not present

## 2024-01-21 DIAGNOSIS — R2681 Unsteadiness on feet: Secondary | ICD-10-CM | POA: Diagnosis not present

## 2024-01-21 DIAGNOSIS — R4789 Other speech disturbances: Secondary | ICD-10-CM | POA: Diagnosis not present

## 2024-01-21 DIAGNOSIS — R4185 Anosognosia: Secondary | ICD-10-CM | POA: Diagnosis not present

## 2024-01-21 DIAGNOSIS — Z9181 History of falling: Secondary | ICD-10-CM | POA: Diagnosis not present

## 2024-01-21 DIAGNOSIS — M62552 Muscle wasting and atrophy, not elsewhere classified, left thigh: Secondary | ICD-10-CM | POA: Diagnosis not present

## 2024-01-21 DIAGNOSIS — M62511 Muscle wasting and atrophy, not elsewhere classified, right shoulder: Secondary | ICD-10-CM | POA: Diagnosis not present

## 2024-01-21 DIAGNOSIS — R41841 Cognitive communication deficit: Secondary | ICD-10-CM | POA: Diagnosis not present

## 2024-01-21 DIAGNOSIS — R2689 Other abnormalities of gait and mobility: Secondary | ICD-10-CM | POA: Diagnosis not present

## 2024-01-21 DIAGNOSIS — R32 Unspecified urinary incontinence: Secondary | ICD-10-CM | POA: Diagnosis not present

## 2024-01-21 DIAGNOSIS — R42 Dizziness and giddiness: Secondary | ICD-10-CM | POA: Diagnosis not present

## 2024-01-21 DIAGNOSIS — M62512 Muscle wasting and atrophy, not elsewhere classified, left shoulder: Secondary | ICD-10-CM | POA: Diagnosis not present

## 2024-01-21 DIAGNOSIS — M62551 Muscle wasting and atrophy, not elsewhere classified, right thigh: Secondary | ICD-10-CM | POA: Diagnosis not present

## 2024-01-22 ENCOUNTER — Ambulatory Visit (INDEPENDENT_AMBULATORY_CARE_PROVIDER_SITE_OTHER): Admitting: Psychology

## 2024-01-22 DIAGNOSIS — R4789 Other speech disturbances: Secondary | ICD-10-CM | POA: Diagnosis not present

## 2024-01-22 DIAGNOSIS — F411 Generalized anxiety disorder: Secondary | ICD-10-CM | POA: Diagnosis not present

## 2024-01-22 DIAGNOSIS — F325 Major depressive disorder, single episode, in full remission: Secondary | ICD-10-CM | POA: Diagnosis not present

## 2024-01-22 DIAGNOSIS — R488 Other symbolic dysfunctions: Secondary | ICD-10-CM | POA: Diagnosis not present

## 2024-01-22 DIAGNOSIS — R41841 Cognitive communication deficit: Secondary | ICD-10-CM | POA: Diagnosis not present

## 2024-01-22 DIAGNOSIS — R4185 Anosognosia: Secondary | ICD-10-CM | POA: Diagnosis not present

## 2024-01-22 NOTE — Progress Notes (Unsigned)
 [facility] Counselor/Therapist Progress Note  Patient ID: [name], MRN: [id number]    Date: 01/22/24  Time Spent: 10:00 am - 10:55 am  Treatment Type: Individual Therapy.  Reported Symptoms: Anxiety and Depression  Mental Status Exam: Appearance:  Well Groomed     Behavior: Appropriate  Motor: Normal  Speech/Language:  Normal Rate  Affect: Appropriate  Mood: normal  Thought process: normal  Thought content:   WNL  Sensory/Perceptual disturbances:   WNL  Orientation: oriented to person, place, and time/date  Attention: Good  Concentration: Good  Memory: WNL  Fund of knowledge:  Good  Insight:   Good  Judgment:  Good  Impulse Control: Good   Risk Assessment: Danger to Self:  No Self-injurious Behavior: No Danger to Others: No Duty to Warn:no Physical Aggression / Violence:No  Access to Firearms a concern: No  Gang Involvement:No   Subjective:   [name] participated in the session, in person in the office with the therapist, and consented to treatment. [name] reviewed the events of the past week. Patient was concerned about the results of her brain mri that was done earlier in June.  Transportation on T and R only provided before 12 M, W, F more times during the day.    Interventions: {[phone]}  Diagnosis:   No diagnosis found.  Psychiatric Treatment: {YES/NO:21197}, ***  Treatment Plan:  Client Abilities/Strengths [name] ***  Support System: ***  Client Treatment Preferences ***  Client Statement of Needs [name] would like to ***   Treatment Level {Frequency of sessions.:26745}  Symptoms  ***   (Status: {Symptom Status:26744}) ***   (Status: {Symptom Status:26744})  Goals:   [name] experiences symptoms of ***   Target Date: *** Frequency: {Frequency of sessions.:26745}  Progress: 0 Modality: individual    Therapist will provide referrals for additional resources as appropriate.  Therapist will  provide psycho-education regarding [name]'s diagnosis and corresponding treatment approaches and interventions. [name] will support the patient's ability to achieve the goals identified. will employ CBT, BA, Problem-solving, Solution Focused, Mindfulness,  coping skills, & other evidenced-based practices will be used to promote progress towards healthy functioning to help manage decrease symptoms associated with {his/her/their:21314} diagnosis.   Reduce overall level, frequency, and intensity of the feelings of depression, anxiety and panic evidenced by decreased *** from 6 to 7 days/week to 0 to 1 days/week per client report for at least 3 consecutive months. Verbally express understanding of the relationship between feelings of ***depression, ***anxiety and their impact on thinking patterns and behaviors. Verbalize an understanding of the role that distorted thinking plays in creating fears, excessive worry, and ruminations.  (Jaya participated in the creation of the treatment plan)      Micael Adas

## 2024-01-25 DIAGNOSIS — R488 Other symbolic dysfunctions: Secondary | ICD-10-CM | POA: Diagnosis not present

## 2024-01-25 DIAGNOSIS — R4185 Anosognosia: Secondary | ICD-10-CM | POA: Diagnosis not present

## 2024-01-25 DIAGNOSIS — R4789 Other speech disturbances: Secondary | ICD-10-CM | POA: Diagnosis not present

## 2024-01-25 DIAGNOSIS — M62511 Muscle wasting and atrophy, not elsewhere classified, right shoulder: Secondary | ICD-10-CM | POA: Diagnosis not present

## 2024-01-25 DIAGNOSIS — R32 Unspecified urinary incontinence: Secondary | ICD-10-CM | POA: Diagnosis not present

## 2024-01-25 DIAGNOSIS — M62512 Muscle wasting and atrophy, not elsewhere classified, left shoulder: Secondary | ICD-10-CM | POA: Diagnosis not present

## 2024-01-25 DIAGNOSIS — R41841 Cognitive communication deficit: Secondary | ICD-10-CM | POA: Diagnosis not present

## 2024-01-26 DIAGNOSIS — M62511 Muscle wasting and atrophy, not elsewhere classified, right shoulder: Secondary | ICD-10-CM | POA: Diagnosis not present

## 2024-01-26 DIAGNOSIS — R488 Other symbolic dysfunctions: Secondary | ICD-10-CM | POA: Diagnosis not present

## 2024-01-26 DIAGNOSIS — M62512 Muscle wasting and atrophy, not elsewhere classified, left shoulder: Secondary | ICD-10-CM | POA: Diagnosis not present

## 2024-01-26 DIAGNOSIS — R32 Unspecified urinary incontinence: Secondary | ICD-10-CM | POA: Diagnosis not present

## 2024-01-27 DIAGNOSIS — R4185 Anosognosia: Secondary | ICD-10-CM | POA: Diagnosis not present

## 2024-01-27 DIAGNOSIS — M62552 Muscle wasting and atrophy, not elsewhere classified, left thigh: Secondary | ICD-10-CM | POA: Diagnosis not present

## 2024-01-27 DIAGNOSIS — R42 Dizziness and giddiness: Secondary | ICD-10-CM | POA: Diagnosis not present

## 2024-01-27 DIAGNOSIS — R41841 Cognitive communication deficit: Secondary | ICD-10-CM | POA: Diagnosis not present

## 2024-01-27 DIAGNOSIS — R2689 Other abnormalities of gait and mobility: Secondary | ICD-10-CM | POA: Diagnosis not present

## 2024-01-27 DIAGNOSIS — R2681 Unsteadiness on feet: Secondary | ICD-10-CM | POA: Diagnosis not present

## 2024-01-27 DIAGNOSIS — M62551 Muscle wasting and atrophy, not elsewhere classified, right thigh: Secondary | ICD-10-CM | POA: Diagnosis not present

## 2024-01-27 DIAGNOSIS — R4789 Other speech disturbances: Secondary | ICD-10-CM | POA: Diagnosis not present

## 2024-01-27 DIAGNOSIS — Z9181 History of falling: Secondary | ICD-10-CM | POA: Diagnosis not present

## 2024-01-27 DIAGNOSIS — R488 Other symbolic dysfunctions: Secondary | ICD-10-CM | POA: Diagnosis not present

## 2024-01-29 ENCOUNTER — Ambulatory Visit: Payer: Medicare HMO | Admitting: Family Medicine

## 2024-01-29 ENCOUNTER — Ambulatory Visit: Payer: Self-pay | Admitting: Family Medicine

## 2024-01-29 VITALS — BP 118/58 | HR 68 | Temp 97.6°F | Ht 60.0 in | Wt 148.6 lb

## 2024-01-29 DIAGNOSIS — M62511 Muscle wasting and atrophy, not elsewhere classified, right shoulder: Secondary | ICD-10-CM | POA: Diagnosis not present

## 2024-01-29 DIAGNOSIS — Z0000 Encounter for general adult medical examination without abnormal findings: Secondary | ICD-10-CM

## 2024-01-29 DIAGNOSIS — E785 Hyperlipidemia, unspecified: Secondary | ICD-10-CM

## 2024-01-29 DIAGNOSIS — E538 Deficiency of other specified B group vitamins: Secondary | ICD-10-CM | POA: Diagnosis not present

## 2024-01-29 DIAGNOSIS — R32 Unspecified urinary incontinence: Secondary | ICD-10-CM | POA: Diagnosis not present

## 2024-01-29 DIAGNOSIS — R488 Other symbolic dysfunctions: Secondary | ICD-10-CM | POA: Diagnosis not present

## 2024-01-29 DIAGNOSIS — M62512 Muscle wasting and atrophy, not elsewhere classified, left shoulder: Secondary | ICD-10-CM | POA: Diagnosis not present

## 2024-01-29 LAB — CBC WITH DIFFERENTIAL/PLATELET
Basophils Absolute: 0 10*3/uL (ref 0.0–0.1)
Basophils Relative: 0.4 % (ref 0.0–3.0)
Eosinophils Absolute: 0.1 10*3/uL (ref 0.0–0.7)
Eosinophils Relative: 0.9 % (ref 0.0–5.0)
HCT: 41 % (ref 36.0–46.0)
Hemoglobin: 13.6 g/dL (ref 12.0–15.0)
Lymphocytes Relative: 20.4 % (ref 12.0–46.0)
Lymphs Abs: 1.5 10*3/uL (ref 0.7–4.0)
MCHC: 33.2 g/dL (ref 30.0–36.0)
MCV: 95.8 fl (ref 78.0–100.0)
Monocytes Absolute: 0.4 10*3/uL (ref 0.1–1.0)
Monocytes Relative: 5.8 % (ref 3.0–12.0)
Neutro Abs: 5.5 10*3/uL (ref 1.4–7.7)
Neutrophils Relative %: 72.5 % (ref 43.0–77.0)
Platelets: 214 10*3/uL (ref 150.0–400.0)
RBC: 4.28 Mil/uL (ref 3.87–5.11)
RDW: 13.7 % (ref 11.5–15.5)
WBC: 7.6 10*3/uL (ref 4.0–10.5)

## 2024-01-29 LAB — LIPID PANEL
Cholesterol: 133 mg/dL (ref 0–200)
HDL: 43.6 mg/dL (ref >39.00)
LDL Cholesterol: 44 mg/dL (ref 0–99)
NonHDL: 88.97
Total CHOL/HDL Ratio: 3
Triglycerides: 224 mg/dL — ABNORMAL HIGH (ref 0.0–149.0)
VLDL: 44.8 mg/dL — ABNORMAL HIGH (ref 0.0–40.0)

## 2024-01-29 LAB — COMPREHENSIVE METABOLIC PANEL WITH GFR
ALT: 9 U/L (ref 0–35)
AST: 15 U/L (ref 0–37)
Albumin: 4.1 g/dL (ref 3.5–5.2)
Alkaline Phosphatase: 48 U/L (ref 39–117)
BUN: 22 mg/dL (ref 6–23)
CO2: 31 meq/L (ref 19–32)
Calcium: 9.3 mg/dL (ref 8.4–10.5)
Chloride: 101 meq/L (ref 96–112)
Creatinine, Ser: 0.74 mg/dL (ref 0.40–1.20)
GFR: 73.32 mL/min (ref >60.00)
Glucose, Bld: 103 mg/dL — ABNORMAL HIGH (ref 70–99)
Potassium: 4.1 meq/L (ref 3.5–5.1)
Sodium: 139 meq/L (ref 135–145)
Total Bilirubin: 0.4 mg/dL (ref 0.2–1.2)
Total Protein: 6.5 g/dL (ref 6.0–8.3)

## 2024-01-29 LAB — VITAMIN B12: Vitamin B-12: 482 pg/mL (ref 211–911)

## 2024-01-29 NOTE — Patient Instructions (Addendum)
 Schedule follow up with dentist  Glad you are doing better  Please stop by lab before you go If you have mychart- we will send your results within 3 business days of us  receiving them.  If you do not have mychart- we will call you about results within 5 business days of us  receiving them.  *please also note that you will see labs on mychart as soon as they post. I will later go in and write notes on them- will say notes from Dr. Arlene Ben   Recommended follow up: Return in about 6 months (around 07/30/2024) for followup or sooner if needed.Schedule b4 you leave.

## 2024-01-29 NOTE — Progress Notes (Signed)
 Phone [phone]   Subjective:  Patient presents today for their annual physical. Chief complaint-noted.   See problem oriented charting- ROS- full  review of systems was completed and negative except for: difficulty hearing, hallucinations but less pervasive  The following were reviewed and entered/updated in epic: Past Medical History:  Diagnosis Date   Allergic rhinitis 10/19/2007   No rx      Aortic atherosclerosis (HCC) 01/17/2021   Also with coronary calcium.  CT scan from urology 01/05/2021 ordered by Dr. Nolon Baxter of [facility]   Chest pain 01/24/2018   Chronic daily headache 07/08/2017   Colon polyps    Complication of anesthesia    takes longer to wake up- admitted overnight after colonoscopy   Essential hypertension 10/19/2007   Nadolol  40mg       Generalized anxiety disorder    GERD (gastroesophageal reflux disease) 10/19/2007   prilosec 20mg  otc. Recurs if off   Glaucoma    History of COVID-19 05/2021   mild case   History of skin cancer    basal cell on nose   History of UTI (urinary tract infection)    Hypercholesteremia    Hyperlipidemia 03/14/2009   Vytorin       Lichen simplex chronicus    Liver lesion 11/10/2017   Elective MRI advised for follow up liver cyst. Planned mid 2018 after recovers from surgery   Lumbar stenosis with neurogenic claudication    Macular degeneration of right eye 03/03/2013   diagonosed by Dr. Shelah Derry   Major depressive disorder in remission (HCC) 10/22/2021   Mild cognitive impairment with memory loss 12/04/2022   Osteoarthritis    Osteopenia of neck of right femur 03/29/2019   -1.3 worst t score 03/29/2019; also hip   PONV (postoperative nausea and vomiting)    Post-traumatic headache 10/11/2015   Saw neurology- plan was MRI but never ordered.    Scalp pain 10/22/2015   Stress incontinence 10/19/2007   2 surgeries- no improvement. Urology visits in past. May have one other surgery- holding off for now      Unsteady  gait    Visual hallucinations    Wears glasses    Whooping cough    as a baby   Patient Active Problem List   Diagnosis Date Noted   Mild cognitive impairment with memory loss 12/04/2022    Priority: High   Visual hallucinations     Priority: High   Generalized anxiety disorder 12/03/2022    Priority: Medium    Major depressive disorder in remission (HCC) 10/22/2021    Priority: Medium    Aortic atherosclerosis (HCC) 01/17/2021    Priority: Medium    Liver lesion 11/10/2017    Priority: Medium    Lumbar stenosis with neurogenic claudication 03/20/2016    Priority: Medium    Macular degeneration of right eye 03/03/2013    Priority: Medium    Glaucoma 03/03/2013    Priority: Medium    Hyperlipidemia 03/14/2009    Priority: Medium    Essential hypertension 10/19/2007    Priority: Medium    GERD (gastroesophageal reflux disease) 10/19/2007    Priority: Medium    Chest pain 01/24/2018    Priority: Low   Chronic daily headache 07/08/2017    Priority: Low   Post-traumatic headache 10/11/2015    Priority: Low   Osteoarthritis 10/19/2007    Priority: Low   Stress incontinence 10/19/2007    Priority: Low   Allergic rhinitis 10/19/2007    Priority: Low   Osteopenia of  neck of right femur 03/29/2019   Past Surgical History:  Procedure Laterality Date   ABDOMINAL HYSTERECTOMY  1977   ovaries remain   ANTERIOR LAT LUMBAR FUSION N/A 10/26/2017   Procedure: LUMBAR THREE- LUMBAR FOUR, LUMBAR FOUR- LUMBAR FIVE ANTEROLATERAL LUMBAR INTERBODY ARTHRODESIS;  Surgeon: Yvonna Herder, MD;  Location: [facility] OR;  Service: Neurosurgery;  Laterality: N/A;  LUMBAR 3- LUMBAR 4, LUMBAR 4- LUMBAR 5 ANTEROLATERAL LUMBAR INTERBODY ARTHRODESIS, LUMBAR 3- LUMBAR 4, LUMBAR 4- LUMBAR 5 PERCUTANEOUS PEDICLE SCREW FIXATION   APPENDECTOMY     APPLICATION OF WOUND VAC Left 04/27/2021   Procedure: APPLICATION OF INCISIONAL WOUND VAC;  Surgeon: Donnamarie Gables, MD;  Location: [facility] OR;  Service: Orthopedics;   Laterality: Left;   APPLICATION OF WOUND VAC Left 07/01/2021   Procedure: APPLICATION OF WOUND VAC;  Surgeon: Barb Bonito, MD;  Location: [facility] OR;  Service: Plastics;  Laterality: Left;   BLADDER REPAIR     x two   CHOLECYSTECTOMY     COLONOSCOPY     EYE SURGERY  11/14, 12/14   Macular Dengeneration, Glaucoma   EYE SURGERY  2016   left eye   I & D EXTREMITY Left 04/27/2021   Procedure: IRRIGATION AND DEBRIDEMENT LEFT LEG;  Surgeon: Donnamarie Gables, MD;  Location: [facility] OR;  Service: Orthopedics;  Laterality: Left;   INCISION AND DRAINAGE OF WOUND Right 04/27/2021   Procedure: IRRIGATION AND DEBRIDEMENT WOUND;  Surgeon: Donnamarie Gables, MD;  Location: [facility] OR;  Service: Orthopedics;  Laterality: Right;   LESION REMOVAL Right 06/28/2013   Procedure: EXCISION 3 cm right labial sebaceous cyst;  Surgeon: Axel Bohr, MD;  Location: [facility]S;  Service: Gynecology;  Laterality: Right;   LUMBAR LAMINECTOMY/DECOMPRESSION MICRODISCECTOMY Right 03/20/2016   Procedure: Right - Lumbar four-five lumbar laminotomy, foraminotomy, and possible microdiscectomy;  Surgeon: Yvonna Herder, MD;  Location: [facility];  Service: Neurosurgery;  Laterality: Right;  right   LUMBAR PERCUTANEOUS PEDICLE SCREW 2 LEVEL  10/26/2017   Procedure: LUMBAR THREE- LUMBAR FOUR, LUMBAR FOUR- LUMBAR FIVE PERCUTANEOUS PEDICLE SCREW FIXATION;  Surgeon: Yvonna Herder, MD;  Location: [facility] OR;  Service: Neurosurgery;;   MOHS SURGERY     procedure to remove basal cell   SKIN SPLIT GRAFT Left 07/01/2021   Procedure: SKIN GRAFT SPLIT THICKNESS;  Surgeon: Barb Bonito, MD;  Location: [facility] OR;  Service: Plastics;  Laterality: Left;  1 hour   TONSILLECTOMY AND ADENOIDECTOMY     URETHRAL SLING  2007   URETHRAL SLING  1/12   midurethral    vertebroplasty secondary to traumatic compression fracture      Family History  Problem Relation Age of Onset   Congestive Heart Failure Mother    Thyroid  disease Mother     Osteoporosis Mother    Alcoholism Mother    Prostate cancer Father    Hypertension Sister    Thyroid  disease Sister    Cancer Sister        brain ?   Breast cancer Maternal Grandmother    Rheum arthritis Daughter     Medications- reviewed and updated Current Outpatient Medications  Medication Sig Dispense Refill   cephALEXin  (KEFLEX ) 250 MG capsule Take 250 mg by mouth daily.     COMBIGAN  0.2-0.5 % ophthalmic solution instill 1 drop into both eyes twice a day. 5 mL 3   divalproex  (DEPAKOTE ) 125 MG DR tablet Take 1 tablet (125 mg total) by mouth at bedtime. 30 tablet 5   donepezil  (ARICEPT )  10 MG tablet Take 0.5 tablets (5 mg total) by mouth at bedtime. Take one tablet daily 30 tablet 11   ezetimibe -simvastatin  (VYTORIN ) 10-20 MG tablet TAKE ONE TABLET BY MOUTH DAILY 90 tablet 1   Multiple Vitamins-Minerals (MULTIVITAMIN ADULT) CHEW Chew 2 each by mouth daily. Nature Made for Her     nadolol  (CORGARD ) 20 MG tablet TAKE ONE TABLET BY MOUTH AT BEDTIME 90 tablet 3   omeprazole  (PRILOSEC) 20 MG capsule Take 20 mg by mouth daily. In the morning for acid reflux over the counter     traZODone  (DESYREL ) 100 MG tablet take 1/4 TO 1/2 tablet AT BEDTIME 45 tablet 3   venlafaxine  XR (EFFEXOR -XR) 150 MG 24 hr capsule TAKE ONE CAPSULE BY MOUTH EVERY MORNING 90 capsule 3   nitrofurantoin  (MACRODANTIN ) 50 MG capsule TAKE ONE CAPSULE ONCE DAILY (Patient not taking: Reported on 01/29/2024) 90 capsule 0   No current facility-administered medications for this visit.    Allergies-reviewed and updated Allergies  Allergen Reactions   Citrate Of Magnesia Other (See Comments)    confusion   Codeine Nausea And Vomiting   Shingrix [Zoster Vac Recomb Adjuvanted] Swelling and Rash    Rash and temp x several days. (low grade 100 -101 )     Social History   Social History Narrative   HSG, attended [employer]   Married '65   1 son '67, 1 Daughter '69; 5 grandchildren. 1 grandson dealing with drugs  after divorce of parents. Oldest.    Work: retired Runner, broadcasting/film/video   Marriage in good health   Former smoker   Right-handed   Caffeine: 11/2 cups per day   Objective  Objective:  BP (!) 118/58   Pulse 68   Temp 97.6 F (36.4 C) (Temporal)   Ht 5' (1.524 m)   Wt 148 lb 9.6 oz (67.4 kg)   LMP 08/11/1974   SpO2 96%   BMI 29.02 kg/m  Gen: NAD, resting comfortably HEENT: Mucous membranes are moist. Oropharynx normal. Tympanic membrane normal on right, impaction of cerumen on left with only partial view of tympanic membrane - team to irrigate Neck: no thyromegaly CV: RRR no murmurs rubs or gallops Lungs: CTAB no crackles, wheeze, rhonchi Abdomen: soft/nontender/nondistended/normal bowel sounds. No rebound or guarding.  Ext: trace to 1+ edema Skin: warm, dry Neuro: grossly normal, moves all extremities, PERRLA   Assessment and Plan   88 y.o. female presenting for annual physical.  Health Maintenance counseling: 1. Anticipatory guidance: Patient counseled regarding regular dental exams - advised q6 months, eye exams - regularly,  avoiding smoking and second hand smoke , limiting alcohol to 1 beverage per day- none , no illicit drugs .   2. Risk factor reduction:  Advised patient of need for regular exercise and diet rich and fruits and vegetables to reduce risk of heart attack and stroke.  Exercise- walking dog twice a day for variable timeframe at least 5 minutes or more.  Diet/weight management-within 3 lbs of last year.  Wt Readings from Last 3 Encounters:  01/29/24 148 lb 9.6 oz (67.4 kg)  11/18/23 144 lb (65.3 kg)  09/22/23 148 lb 3.2 oz (67.2 kg)  3. Immunizations/screenings/ancillary studies- reaction to shingles shot in past with rash and fever- hold off Immunization History  Administered Date(s) Administered   Fluad Quad(high Dose 65+) 05/26/2019, 06/18/2020, 05/15/2022   Influenza Whole 07/18/2004, 04/25/2010, 04/28/2010   Influenza, High Dose Seasonal PF 08/26/2013,  04/19/2014, 04/04/2016, 05/21/2021, 05/26/2023   Influenza-Unspecified 05/11/2012, 04/03/2015, 04/13/2017  Moderna Covid-19 Fall Seasonal Vaccine 58yrs & older 06/11/2022   PFIZER Comirnaty(Gray Top)Covid-19 Tri-Sucrose Vaccine 01/10/2021   PFIZER(Purple Top)SARS-COV-2 Vaccination 09/09/2019, 09/30/2019, 05/24/2020   Pfizer Covid-19 Vaccine Bivalent Booster 11yrs & up 05/21/2021   Pneumococcal Conjugate-13 10/08/2015   Pneumococcal Polysaccharide-23 08/11/2006   Td 03/09/2008   Tdap 04/03/2015, 12/17/2021   Zoster Recombinant(Shingrix) 12/17/2016   Zoster, Live 08/11/2009   4. Cervical cancer screening- past age based screening recommendations . No discharge or blood 5. Breast cancer screening-  past age based screening recommendations . No lumps or concners 6. Colon cancer screening - past age based screening recommendations  7. Skin cancer screening- no recent dermatology visits. advised regular sunscreen use. Denies worrisome, changing, or new skin lesions.  8. Birth control/STD check- not dating and not planning to  70. Osteoporosis screening at 40- osteopenia about 5 years ago- offered DEXA- she declines. Avoiding falls is key 10. Smoking associated screening - former smoker- quit 1967  Status of chronic or acute concerns   # Early Alzheimer's per Dr. Kitty Perkins on 01/15/2023 S: Medication: donepezil  10 mg daily -Patient has dealt with hallucinations for years-mainly at night but keeping the blinds closed helps- depakote  has helped slightly with it being as pervasive A/P: no recent worsening in memory that she's aware and hallucinations slightly better- continue current medications    # Depression S: Medication: Venlafaxine  150 mg extended release in the morning, trazodone  100 mg daily . Sleeping ok once falls asleep    01/29/2024    1:12 PM 11/18/2023    1:47 PM 07/30/2023    1:21 PM  Depression screen PHQ 2/9  Decreased Interest 0 0 0  Down, Depressed, Hopeless 0 0 0  PHQ - 2 Score 0  0 0  Altered sleeping 0 0 0  Tired, decreased energy 2 0 0  Change in appetite 0 0 0  Feeling bad or failure about yourself  0 0 0  Trouble concentrating 1 0 0  Moving slowly or fidgety/restless 0 0 0  Suicidal thoughts 0 0 0  PHQ-9 Score 3 0 0  Difficult doing work/chores Not difficult at all Not difficult at all Not difficult at all   A/P: full remission- continue current medications   -working with therapy Ferman Houston  #hypertension S: medication: Nadolol  20 mg daily BP Readings from Last 3 Encounters:  01/29/24 (!) 118/58  11/18/23 120/68  10/13/23 (!) 151/77  A/P: well controlled continue current medications   #hyperlipidemia # Aortic atherosclerosis S: Medication:Vytorin  10-20 mg daily  Lab Results  Component Value Date   CHOL 154 01/27/2023   HDL 45.50 01/27/2023   LDLCALC 69 01/27/2023   LDLDIRECT 84.0 03/06/2017   TRIG 196.0 (H) 01/27/2023   CHOLHDL 3 01/27/2023   A/P: aortic atherosclerosis (presumed stable)- LDL goal ideally <70 - hopefully stable- update lipids    # B12 deficiency S: Current treatment/medication (oral vs. IM): 1000 mcg daily - taking sparingly  Lab Results  Component Value Date   VITAMINB12 406 01/27/2023   A/P: hopefully stable- update b12 today. Continue current meds for now    # Glaucoma-follows with ophthalmology-encouraged follow-up- she's consistent  -also has macular degeneration in opposite eye   Recommended follow up: Return in about 5 months (around 06/30/2024) for followup or sooner if needed.Schedule b4 you leave. Future Appointments  Date Time Provider Department Center  02/04/2024  1:00 PM Rowan Cooter, PhD [facility] None  02/04/2024  2:00 PM [facility] None  02/05/2024 10:00 AM  [name] [facility] None  02/11/2024  2:30 PM Rowan Cooter, PhD [facility] None  02/19/2024 10:00 AM Ferman Houston M [facility] None  03/04/2024 10:00 AM Ferman Houston M [facility] None  03/18/2024 10:00 AM [name] [facility] None   04/01/2024 10:00 AM Ferman Houston M [facility] None  04/27/2024  3:00 PM Rosi Converse, PA-C [facility] None  11/23/2024  1:00 PM [facility] ANNUAL WELLNESS VISIT 1 [facility]    Lab/Order associations: NOT fasting   ICD-10-CM   1. Preventative health care  Z00.00     2. Hyperlipidemia, unspecified hyperlipidemia type  E78.5     3. B12 deficiency  E53.8       No orders of the defined types were placed in this encounter.   Return precautions advised.  Clarisa Crooked, MD

## 2024-02-01 DIAGNOSIS — M62511 Muscle wasting and atrophy, not elsewhere classified, right shoulder: Secondary | ICD-10-CM | POA: Diagnosis not present

## 2024-02-01 DIAGNOSIS — M62512 Muscle wasting and atrophy, not elsewhere classified, left shoulder: Secondary | ICD-10-CM | POA: Diagnosis not present

## 2024-02-01 DIAGNOSIS — R42 Dizziness and giddiness: Secondary | ICD-10-CM | POA: Diagnosis not present

## 2024-02-01 DIAGNOSIS — M62551 Muscle wasting and atrophy, not elsewhere classified, right thigh: Secondary | ICD-10-CM | POA: Diagnosis not present

## 2024-02-01 DIAGNOSIS — M62552 Muscle wasting and atrophy, not elsewhere classified, left thigh: Secondary | ICD-10-CM | POA: Diagnosis not present

## 2024-02-01 DIAGNOSIS — R32 Unspecified urinary incontinence: Secondary | ICD-10-CM | POA: Diagnosis not present

## 2024-02-01 DIAGNOSIS — Z9181 History of falling: Secondary | ICD-10-CM | POA: Diagnosis not present

## 2024-02-01 DIAGNOSIS — R2689 Other abnormalities of gait and mobility: Secondary | ICD-10-CM | POA: Diagnosis not present

## 2024-02-01 DIAGNOSIS — R488 Other symbolic dysfunctions: Secondary | ICD-10-CM | POA: Diagnosis not present

## 2024-02-01 DIAGNOSIS — R2681 Unsteadiness on feet: Secondary | ICD-10-CM | POA: Diagnosis not present

## 2024-02-02 DIAGNOSIS — M62511 Muscle wasting and atrophy, not elsewhere classified, right shoulder: Secondary | ICD-10-CM | POA: Diagnosis not present

## 2024-02-02 DIAGNOSIS — R32 Unspecified urinary incontinence: Secondary | ICD-10-CM | POA: Diagnosis not present

## 2024-02-02 DIAGNOSIS — R488 Other symbolic dysfunctions: Secondary | ICD-10-CM | POA: Diagnosis not present

## 2024-02-02 DIAGNOSIS — M62512 Muscle wasting and atrophy, not elsewhere classified, left shoulder: Secondary | ICD-10-CM | POA: Diagnosis not present

## 2024-02-02 NOTE — Telephone Encounter (Signed)
 Reading room called and will send report stat.

## 2024-02-02 NOTE — Telephone Encounter (Signed)
-----   Message from Garnette Lukes sent at 01/29/2024  2:06 PM EDT ----- Team can you check with radiology- MRI done almost 3 weeks ago and still nto read

## 2024-02-03 DIAGNOSIS — R2689 Other abnormalities of gait and mobility: Secondary | ICD-10-CM | POA: Diagnosis not present

## 2024-02-03 DIAGNOSIS — M62552 Muscle wasting and atrophy, not elsewhere classified, left thigh: Secondary | ICD-10-CM | POA: Diagnosis not present

## 2024-02-03 DIAGNOSIS — R2681 Unsteadiness on feet: Secondary | ICD-10-CM | POA: Diagnosis not present

## 2024-02-03 DIAGNOSIS — Z9181 History of falling: Secondary | ICD-10-CM | POA: Diagnosis not present

## 2024-02-03 DIAGNOSIS — R4185 Anosognosia: Secondary | ICD-10-CM | POA: Diagnosis not present

## 2024-02-03 DIAGNOSIS — R4789 Other speech disturbances: Secondary | ICD-10-CM | POA: Diagnosis not present

## 2024-02-03 DIAGNOSIS — M62551 Muscle wasting and atrophy, not elsewhere classified, right thigh: Secondary | ICD-10-CM | POA: Diagnosis not present

## 2024-02-03 DIAGNOSIS — R488 Other symbolic dysfunctions: Secondary | ICD-10-CM | POA: Diagnosis not present

## 2024-02-03 DIAGNOSIS — R42 Dizziness and giddiness: Secondary | ICD-10-CM | POA: Diagnosis not present

## 2024-02-03 DIAGNOSIS — R41841 Cognitive communication deficit: Secondary | ICD-10-CM | POA: Diagnosis not present

## 2024-02-04 ENCOUNTER — Encounter: Payer: Self-pay | Admitting: Psychology

## 2024-02-04 ENCOUNTER — Ambulatory Visit: Payer: Medicare HMO | Admitting: Psychology

## 2024-02-04 ENCOUNTER — Ambulatory Visit: Payer: Self-pay | Admitting: Psychology

## 2024-02-04 DIAGNOSIS — R4789 Other speech disturbances: Secondary | ICD-10-CM | POA: Diagnosis not present

## 2024-02-04 DIAGNOSIS — M62511 Muscle wasting and atrophy, not elsewhere classified, right shoulder: Secondary | ICD-10-CM | POA: Diagnosis not present

## 2024-02-04 DIAGNOSIS — R4185 Anosognosia: Secondary | ICD-10-CM | POA: Diagnosis not present

## 2024-02-04 DIAGNOSIS — R441 Visual hallucinations: Secondary | ICD-10-CM | POA: Diagnosis not present

## 2024-02-04 DIAGNOSIS — R488 Other symbolic dysfunctions: Secondary | ICD-10-CM | POA: Diagnosis not present

## 2024-02-04 DIAGNOSIS — R32 Unspecified urinary incontinence: Secondary | ICD-10-CM | POA: Diagnosis not present

## 2024-02-04 DIAGNOSIS — M62512 Muscle wasting and atrophy, not elsewhere classified, left shoulder: Secondary | ICD-10-CM | POA: Diagnosis not present

## 2024-02-04 DIAGNOSIS — G3184 Mild cognitive impairment, so stated: Secondary | ICD-10-CM

## 2024-02-04 DIAGNOSIS — R4189 Other symptoms and signs involving cognitive functions and awareness: Secondary | ICD-10-CM

## 2024-02-04 DIAGNOSIS — R41841 Cognitive communication deficit: Secondary | ICD-10-CM | POA: Diagnosis not present

## 2024-02-04 NOTE — Progress Notes (Signed)
   Psychometrician Note   Cognitive testing was administered to [name] by Lonell Jude, B.S. (psychometrist) under the supervision of Dr. Arthea KYM Maryland, Ph.D., ABPP, licensed psychologist on 02/04/2024. [name] did not appear overtly distressed by the testing session per behavioral observation or responses across self-report questionnaires. Rest breaks were offered.    The battery of tests administered was selected by Dr. Zachary C. Merz, Ph.D., ABPP with consideration to [name]'s current level of functioning, the nature of her symptoms, emotional and behavioral responses during interview, level of literacy, observed level of motivation/effort, and the nature of the referral question. This battery was communicated to the psychometrist. Communication between Dr. Arthea KYM Maryland, Ph.D., ABPP and the psychometrist was ongoing throughout the evaluation and Dr. Arthea KYM Maryland, Ph.D., ABPP was immediately accessible at all times. Dr. Zachary C. Merz, Ph.D., ABPP provided supervision to the psychometrist on the date of this service to the extent necessary to assure the quality of all services provided.    [name] will return within approximately 1-2 weeks for an interactive feedback session with Dr. Maryland at which time her test performances, clinical impressions, and treatment recommendations will be reviewed in detail. [name] understands she can contact our office should she require our assistance before this time.  A total of 155 minutes of billable time were spent face-to-face with [name] by the psychometrist. This includes both test administration and scoring time. Billing for these services is reflected in the clinical report generated by Dr. Arthea KYM Maryland, Ph.D., ABPP  This note reflects time spent with the psychometrician and does not include test scores or any clinical interpretations made by Dr. Maryland. The full report will follow in a separate note.

## 2024-02-04 NOTE — Progress Notes (Signed)
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 questions asked of her appropriately. Assessed expressive language was variable. Phonemic fluency was average, semantic fluency was exceptionally low to below average, and confrontation naming was average across a screening task but well below average across a more comprehensive task.   Assessed visuospatial/visuoconstructional abilities were average to above average.    Learning (i.e., encoding) of novel verbal information was average. Spontaneous delayed recall (i.e., retrieval) of previously learned information was variable, ranging from the well below average to average normative ranges. Retention rates were 13% across a list learning task, 75% across a story learning task, and 18% across a figure drawing task. Performance across recognition tasks was below average to average, suggesting some evidence for information consolidation.   Results of emotional screening instruments suggested that recent symptoms of generalized anxiety were in the minimal range, while symptoms of depression were within the mild range. A screening instrument assessing recent sleep quality suggested the presence of minimal sleep dysfunction.  Table of Scores:   Note: This summary of test scores accompanies the  interpretive report and should not be considered in isolation without reference to the appropriate sections in the text. Descriptors are based on appropriate normative data and may be adjusted based on clinical judgment. Terms such as Within Normal Limits and Outside Normal Limits are used when a more specific description of the test score cannot be determined. Descriptors refer to the current evaluation only.        Percentile - Normative Descriptor > 98 - Exceptionally High 91-97 - Well Above Average 75-90 - Above Average 25-74 - Average 9-24 - Below Average 2-8 - Well Below Average < 2 - Exceptionally Low        Validity: April 2024 Current  DESCRIPTOR        DCT: --- --- --- Within Normal Limits  RBANS EI: --- --- --- Within Normal Limits  WAIS-IV RDS: --- --- --- Within Normal Limits        Orientation:       Raw Score Raw Score Percentile   NAB Orientation, Form 1 26/29 28/29 --- ---  Cognitive Screening:       Raw Score Raw Score Percentile   SLUMS: 18/30 26/30 --- ---        RBANS, Form A: Standard Score/ Scaled Score Standard Score/ Scaled Score Percentile   Total Score 90 86 18 Below Average  Immediate Memory 81 94 34 Average    List Learning 8 9 37 Average    Story Memory 5 9 37 Average  Visuospatial/Constructional 112 96 39 Average    Figure Copy 14 10 50 Average    Line Orientation 16/20 15/20 26-50 Average  Language 92 83 13 Below Average    Picture Naming 10/10 9/10 26-50 Average    Semantic Fluency 6 3 1  Exceptionally Low  Attention 103 109 73 Average    Digit Span 10 13 84 Above Average    Coding 11 10 50 Average  Delayed Memory 78 71 3 Well Below Average    List Recall 1/10 1/10 10-16 Below Average    List Recognition 18/20 17/20 10-16 Below Average    Story Recall 5 8 25  Average    Story Recognition 9/12 11/12 68-86 Average    Figure Recall 7 4 2  Well Below Average    Figure Recognition 4/8 4/8 21-38 Below Average  to Average          Intellectual Functioning:       Standard Score Standard Score Percentile   Test of Premorbid Functioning: 97 100 50 Average        Attention/Executive Function:      Trail Making Test (TMT): Raw Score (Scaled Score) Raw Score (Scaled Score) Percentile     Part A 50 secs.,  1 error (10) 37 secs.,  1 error (12) 75 Above Average    Part B 206 secs.,  4 errors (7) 97 secs.,  2 errors (12) 75 Above Average  *Based on [employer] Older Normative Studies (MOANS)             Scaled Score Scaled Score Percentile   WAIS-IV Digit Span: --- 13 84 Above Average    Forward --- 11 63 Average    Backward --- 14 91 Well Above Average    Sequencing --- 14 91 Well Above Average        Language:      Verbal Fluency Test: Raw Score (Scaled Score) Raw Score (Scaled Score) Percentile     Phonemic Fluency (CFL) 31 (10) 36 (11) 63 Average    Category Fluency 27 (7) 28 (7) 16 Below Average  *Based on [employer] Older Normative Studies (MOANS)            NAB Language Module, Form 1: T Score T Score Percentile     Naming 26/31 (39) 24/31 (34) 5 Well Below Average        Visuospatial/Visuoconstruction:       Raw Score Raw Score Percentile   Clock Drawing: 8/10 8/10 --- Within Normal Limits        NAB Spatial Module, Form 1: T Score T Score Percentile     Visual Discrimination 51 55 69 Average         Scaled Score Scaled Score Percentile   WAIS-IV Block Design: 9 12 75 Above Average        Mood and Personality:       Raw Score Raw Score Percentile   Geriatric Depression Scale: 8 12 --- Mild  Geriatric Anxiety Scale: 18 6 --- Minimal    Somatic 4 1 --- Minimal  Cognitive 5 2 --- Minimal    Affective 9 3 --- Minimal        Additional Questionnaires:       Raw Score Raw Score Percentile   PROMIS Sleep Disturbance Questionnaire: 8 10 --- None to Slight   Informed Consent and Coding/Compliance:   The current evaluation represents a clinical evaluation for the purposes previously outlined by the  referral source and is in no way reflective of a forensic evaluation.   Ms. Penninger was provided with a verbal description of the nature and purpose of the present neuropsychological evaluation. Also reviewed were the foreseeable risks and/or discomforts and benefits of the procedure, limits of confidentiality, and mandatory reporting requirements of this provider. The patient was given the opportunity to ask questions and receive answers about the evaluation. Oral consent to participate was provided by the patient.   This evaluation was conducted by Arthea KYM Maryland, Ph.D., ABPP-CN, board certified clinical neuropsychologist. Ms. Rua completed a clinical interview with Dr. Maryland, billed as one unit [phone], and 155 minutes of cognitive testing and scoring, billed as one unit [phone] and four additional units [id number]. Psychometrist Lonell Jude, B.S. assisted Dr. Maryland with test administration and scoring procedures. As a separate and discrete service, one unit [phone] and two units 96133 (160 minutes) were billed for Dr. Loralee time spent in interpretation and report writing.

## 2024-02-05 ENCOUNTER — Ambulatory Visit (INDEPENDENT_AMBULATORY_CARE_PROVIDER_SITE_OTHER): Admitting: Psychology

## 2024-02-05 DIAGNOSIS — F411 Generalized anxiety disorder: Secondary | ICD-10-CM | POA: Diagnosis not present

## 2024-02-05 DIAGNOSIS — R4185 Anosognosia: Secondary | ICD-10-CM | POA: Diagnosis not present

## 2024-02-05 DIAGNOSIS — Z9181 History of falling: Secondary | ICD-10-CM | POA: Diagnosis not present

## 2024-02-05 DIAGNOSIS — F325 Major depressive disorder, single episode, in full remission: Secondary | ICD-10-CM | POA: Diagnosis not present

## 2024-02-05 DIAGNOSIS — R2689 Other abnormalities of gait and mobility: Secondary | ICD-10-CM | POA: Diagnosis not present

## 2024-02-05 DIAGNOSIS — R2681 Unsteadiness on feet: Secondary | ICD-10-CM | POA: Diagnosis not present

## 2024-02-05 DIAGNOSIS — M62552 Muscle wasting and atrophy, not elsewhere classified, left thigh: Secondary | ICD-10-CM | POA: Diagnosis not present

## 2024-02-05 DIAGNOSIS — M62551 Muscle wasting and atrophy, not elsewhere classified, right thigh: Secondary | ICD-10-CM | POA: Diagnosis not present

## 2024-02-05 DIAGNOSIS — R41841 Cognitive communication deficit: Secondary | ICD-10-CM | POA: Diagnosis not present

## 2024-02-05 DIAGNOSIS — R4789 Other speech disturbances: Secondary | ICD-10-CM | POA: Diagnosis not present

## 2024-02-05 DIAGNOSIS — R42 Dizziness and giddiness: Secondary | ICD-10-CM | POA: Diagnosis not present

## 2024-02-05 DIAGNOSIS — R488 Other symbolic dysfunctions: Secondary | ICD-10-CM | POA: Diagnosis not present

## 2024-02-05 NOTE — Progress Notes (Unsigned)
   [facility] Counselor/Therapist Progress Note  Patient ID: [name], MRN: [id number]    Date: 02/05/24  Time Spent: 10:00 am - 10:57 am - 57 minutes  Treatment Type: Individual Therapy.  Reported Symptoms: Anxiety and Depression  Mental Status Exam: Appearance:  Well Groomed     Behavior: Appropriate  Motor: Normal  Speech/Language:  Normal Rate  Affect: Appropriate  Mood: normal  Thought process: normal  Thought content:   WNL  Sensory/Perceptual disturbances:   WNL  Orientation: oriented to person and place  Attention: Good  Concentration: Good  Memory: varied  Fund of knowledge:  Good  Insight:   Good   Judgment:  Good  Impulse Control: Good   Risk Assessment: Danger to Self:  No Self-injurious Behavior: No Danger to Others: No Duty to Warn:no Physical Aggression / Violence:No  Access to Firearms a concern: No  Gang Involvement:No   Subjective:   [name] participated in the session, in person in the office with the therapist, and consented to treatment. [name] reviewed the events of the past week.   Patient had psychometric testing with neurology.    Interventions: Cognitive Behavioral Therapy  Diagnosis: Major depressive disorder in remission, unspecified whether recurrent (HCC) [F32.5]  GAD (generalized anxiety disorder) [F41.1]   Psychiatric Treatment: Yes , via PCP  Treatment Plan:  Client Abilities/Strengths [name] is bright, expressive, verbally fluent, engaging, and very motivated for change   Support System: Adult children and friends  Client Treatment Preferences OPT  Client Statement of Needs [name] would like to set additional goals, do more self-care, learn more about her symptoms, increase her mindfulness, organize her thoughts in between sessions, and share her worries so she will feel better.     Treatment Level Biweekly  Symptoms Anxiety: feeling nervous and on edge, not being able to control  worrying, trouble relaxing, becoming easily annoyed and irritable, feeling afraid as if something awful might happen. (Status: maintained) Depression: feeling down, feeling tired, and feeling like letting family down. (Status: maintained)  Goals:   [name] experiences symptoms of anxiety and depression.   Target Date: 09/07/24 Frequency: Biweekly  Progress: 0 Modality: individual    Therapist will provide referrals for additional resources as appropriate.  Therapist will provide psycho-education regarding [name]'s diagnosis and corresponding treatment approaches and interventions. [name] will support the patient's ability to achieve the goals identified. will employ CBT, BA, Problem-solving, Solution Focused, Mindfulness,  coping skills, & other evidenced-based practices will be used to promote progress towards healthy functioning to help manage decrease symptoms associated with their diagnosis.   Reduce overall level, frequency, and intensity of the feelings of depression, anxiety and panic evidenced by decreased overall symptoms from 6 to 7 days/week to 0 to 1 days/week per client report for at least 3 consecutive months. Verbally express understanding of the relationship between feelings of depression and anxiety and their impact on thinking patterns and behaviors. Verbalize an understanding of the role that distorted thinking plays in creating fears, excessive worry, and ruminations.  ([name] participated in the creation of the treatment plan)    [name]

## 2024-02-08 DIAGNOSIS — R42 Dizziness and giddiness: Secondary | ICD-10-CM | POA: Diagnosis not present

## 2024-02-08 DIAGNOSIS — R32 Unspecified urinary incontinence: Secondary | ICD-10-CM | POA: Diagnosis not present

## 2024-02-08 DIAGNOSIS — M62552 Muscle wasting and atrophy, not elsewhere classified, left thigh: Secondary | ICD-10-CM | POA: Diagnosis not present

## 2024-02-08 DIAGNOSIS — M62551 Muscle wasting and atrophy, not elsewhere classified, right thigh: Secondary | ICD-10-CM | POA: Diagnosis not present

## 2024-02-08 DIAGNOSIS — Z9181 History of falling: Secondary | ICD-10-CM | POA: Diagnosis not present

## 2024-02-08 DIAGNOSIS — M62512 Muscle wasting and atrophy, not elsewhere classified, left shoulder: Secondary | ICD-10-CM | POA: Diagnosis not present

## 2024-02-08 DIAGNOSIS — R488 Other symbolic dysfunctions: Secondary | ICD-10-CM | POA: Diagnosis not present

## 2024-02-08 DIAGNOSIS — M62511 Muscle wasting and atrophy, not elsewhere classified, right shoulder: Secondary | ICD-10-CM | POA: Diagnosis not present

## 2024-02-08 DIAGNOSIS — R2681 Unsteadiness on feet: Secondary | ICD-10-CM | POA: Diagnosis not present

## 2024-02-08 DIAGNOSIS — R2689 Other abnormalities of gait and mobility: Secondary | ICD-10-CM | POA: Diagnosis not present

## 2024-02-09 DIAGNOSIS — R4185 Anosognosia: Secondary | ICD-10-CM | POA: Diagnosis not present

## 2024-02-09 DIAGNOSIS — M62511 Muscle wasting and atrophy, not elsewhere classified, right shoulder: Secondary | ICD-10-CM | POA: Diagnosis not present

## 2024-02-09 DIAGNOSIS — R41841 Cognitive communication deficit: Secondary | ICD-10-CM | POA: Diagnosis not present

## 2024-02-09 DIAGNOSIS — R488 Other symbolic dysfunctions: Secondary | ICD-10-CM | POA: Diagnosis not present

## 2024-02-09 DIAGNOSIS — R4789 Other speech disturbances: Secondary | ICD-10-CM | POA: Diagnosis not present

## 2024-02-09 DIAGNOSIS — R32 Unspecified urinary incontinence: Secondary | ICD-10-CM | POA: Diagnosis not present

## 2024-02-09 DIAGNOSIS — M62512 Muscle wasting and atrophy, not elsewhere classified, left shoulder: Secondary | ICD-10-CM | POA: Diagnosis not present

## 2024-02-10 DIAGNOSIS — R4789 Other speech disturbances: Secondary | ICD-10-CM | POA: Diagnosis not present

## 2024-02-10 DIAGNOSIS — R4185 Anosognosia: Secondary | ICD-10-CM | POA: Diagnosis not present

## 2024-02-10 DIAGNOSIS — R41841 Cognitive communication deficit: Secondary | ICD-10-CM | POA: Diagnosis not present

## 2024-02-10 DIAGNOSIS — R488 Other symbolic dysfunctions: Secondary | ICD-10-CM | POA: Diagnosis not present

## 2024-02-11 ENCOUNTER — Ambulatory Visit: Payer: Medicare HMO | Admitting: Psychology

## 2024-02-11 DIAGNOSIS — Z9181 History of falling: Secondary | ICD-10-CM | POA: Diagnosis not present

## 2024-02-11 DIAGNOSIS — G3184 Mild cognitive impairment, so stated: Secondary | ICD-10-CM | POA: Diagnosis not present

## 2024-02-11 DIAGNOSIS — M62511 Muscle wasting and atrophy, not elsewhere classified, right shoulder: Secondary | ICD-10-CM | POA: Diagnosis not present

## 2024-02-11 DIAGNOSIS — M62512 Muscle wasting and atrophy, not elsewhere classified, left shoulder: Secondary | ICD-10-CM | POA: Diagnosis not present

## 2024-02-11 DIAGNOSIS — R41841 Cognitive communication deficit: Secondary | ICD-10-CM | POA: Diagnosis not present

## 2024-02-11 DIAGNOSIS — M62552 Muscle wasting and atrophy, not elsewhere classified, left thigh: Secondary | ICD-10-CM | POA: Diagnosis not present

## 2024-02-11 DIAGNOSIS — R4185 Anosognosia: Secondary | ICD-10-CM | POA: Diagnosis not present

## 2024-02-11 DIAGNOSIS — R4789 Other speech disturbances: Secondary | ICD-10-CM | POA: Diagnosis not present

## 2024-02-11 DIAGNOSIS — R488 Other symbolic dysfunctions: Secondary | ICD-10-CM | POA: Diagnosis not present

## 2024-02-11 DIAGNOSIS — R42 Dizziness and giddiness: Secondary | ICD-10-CM | POA: Diagnosis not present

## 2024-02-11 DIAGNOSIS — M62551 Muscle wasting and atrophy, not elsewhere classified, right thigh: Secondary | ICD-10-CM | POA: Diagnosis not present

## 2024-02-11 DIAGNOSIS — R2689 Other abnormalities of gait and mobility: Secondary | ICD-10-CM | POA: Diagnosis not present

## 2024-02-11 DIAGNOSIS — R32 Unspecified urinary incontinence: Secondary | ICD-10-CM | POA: Diagnosis not present

## 2024-02-11 DIAGNOSIS — R2681 Unsteadiness on feet: Secondary | ICD-10-CM | POA: Diagnosis not present

## 2024-02-11 DIAGNOSIS — I679 Cerebrovascular disease, unspecified: Secondary | ICD-10-CM

## 2024-02-11 NOTE — Progress Notes (Signed)
   Neuropsychology Feedback Session Gabrielle DEL. [facility] [place] Department of Neurology  Reason for Referral:   [name] is a 88 y.o. right-handed Caucasian female referred by Camie Sevin, PA-C, to characterize her current cognitive functioning and assist with diagnostic clarity and treatment planning in the context of a prior mild neurocognitive disorder diagnosis and concern for progressive cognitive decline.   Feedback:   [name] completed a comprehensive neuropsychological evaluation on 02/04/2024. Please refer to that encounter for the full report and recommendations. Briefly, results suggested cognitive weakness surrounding semantic fluency, confrontation naming, and delayed retrieval aspects of memory. Performances across other assessed cognitive domains were appropriate relative to age-matched peers. Relative to her previous evaluation in April 2024, decline was exhibited across delayed retrieval of visual memory. Very subtle decline could be argued across semantic fluency and confrontation naming; however, performances were more variable as opposed to suggesting consistent decline. She did exhibit a mild improvement retrieving story-based content after a delay. Other assessed domains exhibited relative stability. The etiology for ongoing cognitive dysfunction continues to be uncertain. The very early stages of a neurodegenerative illness such as Alzheimer's disease remain plausible. Retention rates were 13% and 18% across list and figure-based memory tasks respectively. Furthermore, her broad pattern of weakness surrounding delayed retrieval aspects of memory, semantic fluency, and confrontation naming would represent a classic Alzheimer's disease pattern of progression. However, [name] exhibited a 75% retention rate across a story-based task and did show benefit from cueing across memory tasks. While memory patterns suggest concern for rapid forgetting, she does not  exhibit a classic storage impairment, making firmer conclusions surrounding Alzheimer's disease unobtainable at the present time. Given relative stability over time, a stronger vascular contribution (past neuroimaging has suggested progressive moderate microvascular disease and a prior microhemorrhage in the left temporal parietal periventricular region) also could be a possible explanation.   [name] was accompanied by her son via speakerphone during the current feedback session. Content of the current session focused on the results of her neuropsychological evaluation. [name] was given the opportunity to ask questions and her questions were answered. She was encouraged to reach out should additional questions arise. A copy of her report was provided at the conclusion of the visit.      One unit 96132 (31 minutes) was billed for Dr. Loralee time spent preparing for, conducting, and documenting the current feedback session with [name].

## 2024-02-14 ENCOUNTER — Other Ambulatory Visit: Payer: Self-pay | Admitting: Family Medicine

## 2024-02-15 DIAGNOSIS — M62511 Muscle wasting and atrophy, not elsewhere classified, right shoulder: Secondary | ICD-10-CM | POA: Diagnosis not present

## 2024-02-15 DIAGNOSIS — R488 Other symbolic dysfunctions: Secondary | ICD-10-CM | POA: Diagnosis not present

## 2024-02-15 DIAGNOSIS — R2689 Other abnormalities of gait and mobility: Secondary | ICD-10-CM | POA: Diagnosis not present

## 2024-02-15 DIAGNOSIS — M62512 Muscle wasting and atrophy, not elsewhere classified, left shoulder: Secondary | ICD-10-CM | POA: Diagnosis not present

## 2024-02-15 DIAGNOSIS — R42 Dizziness and giddiness: Secondary | ICD-10-CM | POA: Diagnosis not present

## 2024-02-15 DIAGNOSIS — M62552 Muscle wasting and atrophy, not elsewhere classified, left thigh: Secondary | ICD-10-CM | POA: Diagnosis not present

## 2024-02-15 DIAGNOSIS — R32 Unspecified urinary incontinence: Secondary | ICD-10-CM | POA: Diagnosis not present

## 2024-02-15 DIAGNOSIS — R2681 Unsteadiness on feet: Secondary | ICD-10-CM | POA: Diagnosis not present

## 2024-02-15 DIAGNOSIS — M62551 Muscle wasting and atrophy, not elsewhere classified, right thigh: Secondary | ICD-10-CM | POA: Diagnosis not present

## 2024-02-15 DIAGNOSIS — Z9181 History of falling: Secondary | ICD-10-CM | POA: Diagnosis not present

## 2024-02-16 DIAGNOSIS — R2681 Unsteadiness on feet: Secondary | ICD-10-CM | POA: Diagnosis not present

## 2024-02-16 DIAGNOSIS — M62551 Muscle wasting and atrophy, not elsewhere classified, right thigh: Secondary | ICD-10-CM | POA: Diagnosis not present

## 2024-02-16 DIAGNOSIS — Z9181 History of falling: Secondary | ICD-10-CM | POA: Diagnosis not present

## 2024-02-16 DIAGNOSIS — R2689 Other abnormalities of gait and mobility: Secondary | ICD-10-CM | POA: Diagnosis not present

## 2024-02-16 DIAGNOSIS — M62552 Muscle wasting and atrophy, not elsewhere classified, left thigh: Secondary | ICD-10-CM | POA: Diagnosis not present

## 2024-02-16 DIAGNOSIS — R42 Dizziness and giddiness: Secondary | ICD-10-CM | POA: Diagnosis not present

## 2024-02-17 DIAGNOSIS — M62552 Muscle wasting and atrophy, not elsewhere classified, left thigh: Secondary | ICD-10-CM | POA: Diagnosis not present

## 2024-02-17 DIAGNOSIS — M62512 Muscle wasting and atrophy, not elsewhere classified, left shoulder: Secondary | ICD-10-CM | POA: Diagnosis not present

## 2024-02-17 DIAGNOSIS — R2681 Unsteadiness on feet: Secondary | ICD-10-CM | POA: Diagnosis not present

## 2024-02-17 DIAGNOSIS — R2689 Other abnormalities of gait and mobility: Secondary | ICD-10-CM | POA: Diagnosis not present

## 2024-02-17 DIAGNOSIS — R42 Dizziness and giddiness: Secondary | ICD-10-CM | POA: Diagnosis not present

## 2024-02-17 DIAGNOSIS — M62551 Muscle wasting and atrophy, not elsewhere classified, right thigh: Secondary | ICD-10-CM | POA: Diagnosis not present

## 2024-02-17 DIAGNOSIS — R32 Unspecified urinary incontinence: Secondary | ICD-10-CM | POA: Diagnosis not present

## 2024-02-17 DIAGNOSIS — R41841 Cognitive communication deficit: Secondary | ICD-10-CM | POA: Diagnosis not present

## 2024-02-17 DIAGNOSIS — Z9181 History of falling: Secondary | ICD-10-CM | POA: Diagnosis not present

## 2024-02-17 DIAGNOSIS — R4789 Other speech disturbances: Secondary | ICD-10-CM | POA: Diagnosis not present

## 2024-02-17 DIAGNOSIS — R4185 Anosognosia: Secondary | ICD-10-CM | POA: Diagnosis not present

## 2024-02-17 DIAGNOSIS — R488 Other symbolic dysfunctions: Secondary | ICD-10-CM | POA: Diagnosis not present

## 2024-02-17 DIAGNOSIS — M62511 Muscle wasting and atrophy, not elsewhere classified, right shoulder: Secondary | ICD-10-CM | POA: Diagnosis not present

## 2024-02-18 DIAGNOSIS — R4789 Other speech disturbances: Secondary | ICD-10-CM | POA: Diagnosis not present

## 2024-02-18 DIAGNOSIS — R4185 Anosognosia: Secondary | ICD-10-CM | POA: Diagnosis not present

## 2024-02-18 DIAGNOSIS — R488 Other symbolic dysfunctions: Secondary | ICD-10-CM | POA: Diagnosis not present

## 2024-02-18 DIAGNOSIS — R41841 Cognitive communication deficit: Secondary | ICD-10-CM | POA: Diagnosis not present

## 2024-02-19 ENCOUNTER — Ambulatory Visit (INDEPENDENT_AMBULATORY_CARE_PROVIDER_SITE_OTHER): Admitting: Psychology

## 2024-02-19 DIAGNOSIS — R41841 Cognitive communication deficit: Secondary | ICD-10-CM | POA: Diagnosis not present

## 2024-02-19 DIAGNOSIS — F411 Generalized anxiety disorder: Secondary | ICD-10-CM | POA: Diagnosis not present

## 2024-02-19 DIAGNOSIS — M62511 Muscle wasting and atrophy, not elsewhere classified, right shoulder: Secondary | ICD-10-CM | POA: Diagnosis not present

## 2024-02-19 DIAGNOSIS — R4789 Other speech disturbances: Secondary | ICD-10-CM | POA: Diagnosis not present

## 2024-02-19 DIAGNOSIS — F325 Major depressive disorder, single episode, in full remission: Secondary | ICD-10-CM | POA: Diagnosis not present

## 2024-02-19 DIAGNOSIS — R32 Unspecified urinary incontinence: Secondary | ICD-10-CM | POA: Diagnosis not present

## 2024-02-19 DIAGNOSIS — M62512 Muscle wasting and atrophy, not elsewhere classified, left shoulder: Secondary | ICD-10-CM | POA: Diagnosis not present

## 2024-02-19 DIAGNOSIS — R488 Other symbolic dysfunctions: Secondary | ICD-10-CM | POA: Diagnosis not present

## 2024-02-19 DIAGNOSIS — N3001 Acute cystitis with hematuria: Secondary | ICD-10-CM | POA: Diagnosis not present

## 2024-02-19 DIAGNOSIS — R4185 Anosognosia: Secondary | ICD-10-CM | POA: Diagnosis not present

## 2024-02-19 DIAGNOSIS — R300 Dysuria: Secondary | ICD-10-CM | POA: Diagnosis not present

## 2024-02-19 NOTE — Progress Notes (Signed)
 [facility] Counselor/Therapist Progress Note  Patient ID: [name], MRN: [id number]    Date: 02/19/24  Time Spent: 10:00 am - 11:57 am  : 57 minutes  Treatment Type: Individual Therapy.  Reported Symptoms: Anxiety and Depression  Mental Status Exam: Appearance:  Well Groomed     Behavior: Appropriate and Blaming  Motor: Normal  Speech/Language:  Normal Rate  Affect: Appropriate  Mood: normal  Thought process: normal  Thought content:   WNL  Sensory/Perceptual disturbances:   WNL  Orientation: oriented to person, place, and time/date  Attention: Good  Concentration: Good  Memory: WNL  Fund of knowledge:  Good  Insight:   Good  Judgment:  Good  Impulse Control: Good   Risk Assessment: Danger to Self:  No Self-injurious Behavior: No Danger to Others: No Duty to Warn:no Physical Aggression / Violence:No  Access to Firearms a concern: No  Gang Involvement:No   Subjective:   [name] participated in the session, in person in the office with the therapist, and consented to treatment. [name] reviewed the events of the past week.   Patient discussed her feelings about her appt around her neuropsych eval. Pt reported that she felt bad because the doctor thought I needed more help.    Interventions: Cognitive Behavioral Therapy  Diagnosis:    Major depressive disorder in remission, unspecified whether recurrent (HCC) [F32.5]  GAD (generalized anxiety disorder) [F41.1 Psychiatric Treatment: Yes , via PCP  Treatment Plan:  Client Abilities/Strengths [name] bright, expressive, verbally fluent, engaging, and very motivated for change   Support System: Adult children and friends.  Client Treatment Preferences OPT  Client Statement of Needs [name] would like to set additional goals, do more self-care, learn more about her symptoms, increase her mindfulness, organize her thoughts in between sessions, and share her worries so she will  feel better.        Treatment Level Biweekly  Symptoms  Anxiety: feeling nervous and on edge, not being able to control worrying, trouble relaxing, becoming easily annoyed and irritable, feeling afraid as if something awful might happen. (Status: maintained) Depression: feeling down, feeling tired, and feeling like letting family down. (Status: maintained)   Goals:   [name] experiences symptoms of anxiety and depression.   Target Date: 09/07/24 Frequency: Biweekly  Progress: 0 Modality: individual    Therapist will provide referrals for additional resources as appropriate.  Therapist will provide psycho-education regarding [name] diagnosis and corresponding treatment approaches and interventions. [name] will support the patient's ability to achieve the goals identified. will employ CBT, BA, Problem-solving, Solution Focused, Mindfulness,  coping skills, & other evidenced-based practices will be used to promote progress towards healthy functioning to help manage decrease symptoms associated with their diagnosis.   Reduce overall level, frequency, and intensity of the feelings of depression, anxiety and panic evidenced by decreased overall symptoms from 6 to 7 days/week to 0 to 1 days/week per client report for at least 3 consecutive months. Verbally express understanding of the relationship between feelings of depression and anxiety and their impact on thinking patterns and behaviors. Verbalize an understanding of the role that distorted thinking plays in creating fears, excessive worry, and ruminations.  ([name] participated in the creation of the treatment plan)    [name]

## 2024-02-22 DIAGNOSIS — R4185 Anosognosia: Secondary | ICD-10-CM | POA: Diagnosis not present

## 2024-02-22 DIAGNOSIS — R4789 Other speech disturbances: Secondary | ICD-10-CM | POA: Diagnosis not present

## 2024-02-22 DIAGNOSIS — M62511 Muscle wasting and atrophy, not elsewhere classified, right shoulder: Secondary | ICD-10-CM | POA: Diagnosis not present

## 2024-02-22 DIAGNOSIS — R41841 Cognitive communication deficit: Secondary | ICD-10-CM | POA: Diagnosis not present

## 2024-02-22 DIAGNOSIS — R488 Other symbolic dysfunctions: Secondary | ICD-10-CM | POA: Diagnosis not present

## 2024-02-22 DIAGNOSIS — R32 Unspecified urinary incontinence: Secondary | ICD-10-CM | POA: Diagnosis not present

## 2024-02-22 DIAGNOSIS — M62512 Muscle wasting and atrophy, not elsewhere classified, left shoulder: Secondary | ICD-10-CM | POA: Diagnosis not present

## 2024-02-23 DIAGNOSIS — M62511 Muscle wasting and atrophy, not elsewhere classified, right shoulder: Secondary | ICD-10-CM | POA: Diagnosis not present

## 2024-02-23 DIAGNOSIS — R42 Dizziness and giddiness: Secondary | ICD-10-CM | POA: Diagnosis not present

## 2024-02-23 DIAGNOSIS — R2681 Unsteadiness on feet: Secondary | ICD-10-CM | POA: Diagnosis not present

## 2024-02-23 DIAGNOSIS — R2689 Other abnormalities of gait and mobility: Secondary | ICD-10-CM | POA: Diagnosis not present

## 2024-02-23 DIAGNOSIS — Z9181 History of falling: Secondary | ICD-10-CM | POA: Diagnosis not present

## 2024-02-23 DIAGNOSIS — M62551 Muscle wasting and atrophy, not elsewhere classified, right thigh: Secondary | ICD-10-CM | POA: Diagnosis not present

## 2024-02-23 DIAGNOSIS — R32 Unspecified urinary incontinence: Secondary | ICD-10-CM | POA: Diagnosis not present

## 2024-02-23 DIAGNOSIS — M62552 Muscle wasting and atrophy, not elsewhere classified, left thigh: Secondary | ICD-10-CM | POA: Diagnosis not present

## 2024-02-23 DIAGNOSIS — R488 Other symbolic dysfunctions: Secondary | ICD-10-CM | POA: Diagnosis not present

## 2024-02-23 DIAGNOSIS — M62512 Muscle wasting and atrophy, not elsewhere classified, left shoulder: Secondary | ICD-10-CM | POA: Diagnosis not present

## 2024-02-24 DIAGNOSIS — R32 Unspecified urinary incontinence: Secondary | ICD-10-CM | POA: Diagnosis not present

## 2024-02-24 DIAGNOSIS — R2681 Unsteadiness on feet: Secondary | ICD-10-CM | POA: Diagnosis not present

## 2024-02-24 DIAGNOSIS — R2689 Other abnormalities of gait and mobility: Secondary | ICD-10-CM | POA: Diagnosis not present

## 2024-02-24 DIAGNOSIS — M62552 Muscle wasting and atrophy, not elsewhere classified, left thigh: Secondary | ICD-10-CM | POA: Diagnosis not present

## 2024-02-24 DIAGNOSIS — Z9181 History of falling: Secondary | ICD-10-CM | POA: Diagnosis not present

## 2024-02-24 DIAGNOSIS — R488 Other symbolic dysfunctions: Secondary | ICD-10-CM | POA: Diagnosis not present

## 2024-02-24 DIAGNOSIS — R42 Dizziness and giddiness: Secondary | ICD-10-CM | POA: Diagnosis not present

## 2024-02-24 DIAGNOSIS — M62511 Muscle wasting and atrophy, not elsewhere classified, right shoulder: Secondary | ICD-10-CM | POA: Diagnosis not present

## 2024-02-24 DIAGNOSIS — M62512 Muscle wasting and atrophy, not elsewhere classified, left shoulder: Secondary | ICD-10-CM | POA: Diagnosis not present

## 2024-02-24 DIAGNOSIS — M62551 Muscle wasting and atrophy, not elsewhere classified, right thigh: Secondary | ICD-10-CM | POA: Diagnosis not present

## 2024-02-25 ENCOUNTER — Ambulatory Visit: Payer: Self-pay

## 2024-02-25 NOTE — Telephone Encounter (Addendum)
 FYI Only or Action Required?: FYI only for provider. - Canceled per pt request. Advised pt call back if any worsening or new symptoms like spreading rash or fever.  Patient was last seen in primary care on 01/29/2024 by Katrinka Garnette KIDD, MD.  Called Nurse Triage reporting Rash.  Triage Disposition: See Within 3 Days in Office (overriding Duplicate Contact Calls) - Advised pt keep 7/21 appt per other triage nurse's recommendations.  Patient/caregiver understands and will follow disposition?: No, refuses disposition     Reason for Disposition  Caller has already spoken with another triager or doctor (or NP/PA, pharmacist) AND has further questions AND triager able to answer questions.  Answer Assessment - Initial Assessment Questions Pt calling in to give more info to triage nurse Got some info for Cherelle from the lady that checked me out here at [facility], said same as [place] said, lady took pics for [employer], looked like a dermatitis situation and said to get benadryl cream for rash, so I wanted to tell Cherelle that got in touch with this lady and what Cherelle said was right Not gonna go to the doctor about it Confirms she wants to cancel appt for 7/21 Canceled per pt request    Advised pt keep 7/21 appt per other triage nurse's recommendations, pt requesting cancel appt. Canceled per pt request. Advised pt call back if any worsening or new symptoms like spreading rash or fever.  Protocols used: No Contact or Duplicate Contact Call-A-AH   ADDENDUM TO ASSESSMENT: Pt stated that she wanted to try benadryl cream first rather than spend money on appt at this time.

## 2024-02-25 NOTE — Telephone Encounter (Signed)
 FYI Only or Action Required?: Action required by provider: request for appointment.  Patient was last seen in primary care on 01/29/2024 by Katrinka Garnette KIDD, MD.  Called Nurse Triage reporting Rash.itchy widespread  Symptoms began several months ago.  Interventions attempted: Nothing.  Symptoms are: unchanged.  Triage Disposition: See PCP When Office is Open (Within 3 Days)  Patient/caregiver understands and will follow disposition?: Pt has transportation issues. She needs to let transportation have 3 day notice. Unable to have a VV.  Pt will call back to schedule when she can get transportation.            Copied from CRM [id number]. Topic: Clinical - Red Word Triage >> Feb 25, 2024 10:21 AM [name] wrote: Red Word that prompted transfer to Nurse Triage: patient has a rash there's redness/pink and itching Reason for Disposition  Mild widespread rash  (Exception: Heat rash lasting 3 days or less.)  Answer Assessment - Initial Assessment Questions 1. APPEARANCE of RASH: What does the rash look like? (e.g., blisters, dry flaky skin, red spots, redness, sores)     Pink - under the skin 2. SIZE: How big are the spots? (e.g., tip of pen, eraser, coin; inches, centimeters)     no spots 3. LOCATION: Where is the rash located?     everywhere 4. COLOR: What color is the rash? (Note: It is difficult to assess rash color in people with darker-colored skin. When this situation occurs, simply ask the caller to describe what they see.)     pink 5. ONSET: When did the rash begin?     Months 6. FEVER: Do you have a fever? If Yes, ask: What is your temperature, how was it measured, and when did it start?     no 7. ITCHING: Does the rash itch? If Yes, ask: How bad is the itch? (Scale 1-10; or mild, moderate, severe)     Yes at times 8. CAUSE: What do you think is causing the rash?     No idea 9. MEDICINE FACTORS: Have you started any new medicines within the last 2  weeks? (e.g., antibiotics)      no 10. OTHER SYMPTOMS: Do you have any other symptoms? (e.g., dizziness, headache, sore throat, joint pain)       no  Protocols used: Rash or Redness - [facility]

## 2024-02-25 NOTE — Telephone Encounter (Signed)
 Noted

## 2024-02-25 NOTE — Telephone Encounter (Signed)
 FYI Only or Action Required?: FYI only for provider.  Patient was last seen in primary care on 01/29/2024 by Katrinka Garnette KIDD, MD.  Called Nurse Triage reporting Rash.  Symptoms began several weeks ago.  Interventions attempted: Nothing.  Symptoms are: unchanged.  Triage Disposition: See PCP When Office is Open (Within 3 Days)  Patient/caregiver understands and will follow disposition?: Yes     Copied from CRM [id number]. Topic: Clinical - Red Word Triage >> Feb 25, 2024 11:41 AM Thersia BROCKS wrote: [facility] that prompted transfer to Nurse Triage: Patient called in regarding having a rash ,stated she doesn't want it to spread as she lives in a assisted living so wants to know what should she do    ----------------------------------------------------------------------- From previous Reason for Contact - Scheduling: Patient/patient representative is calling to schedule an appointment. Refer to attachments for appointment information. Reason for Disposition  Mild widespread rash  (Exception: Heat rash lasting 3 days or less.)  Answer Assessment - Initial Assessment Questions 1. APPEARANCE of RASH: What does the rash look like? (e.g., blisters, dry flaky skin, red spots, redness, sores)     Pink spots  2. SIZE: How big are the spots? (e.g., tip of pen, eraser, coin; inches, centimeters)     Tiny, tips of pen  3. LOCATION: Where is the rash located?     Stomach , under breast, and back  4. COLOR: What color is the rash? (Note: It is difficult to assess rash color in people with darker-colored skin. When this situation occurs, simply ask the caller to describe what they see.)     Pink in color  5. ONSET: When did the rash begin?     Several weeks  6. FEVER: Do you have a fever? If Yes, ask: What is your temperature, how was it measured, and when did it start?     No  7. ITCHING: Does the rash itch? If Yes, ask: How bad is the itch? (Scale 1-10; or mild, moderate,  severe)     Varies in certain area.   8. CAUSE: What do you think is causing the rash?     Unsure of cause. Could be heat  9. MEDICINE FACTORS: Have you started any new medicines within the last 2 weeks? (e.g., antibiotics)      No  10. OTHER SYMPTOMS: Do you have any other symptoms? (e.g., dizziness, headache, sore throat, joint pain)       No  11. PREGNANCY: Is there any chance you are pregnant? When was your last menstrual period?       No   Lives at [facility].  Protocols used: Rash or Redness - [facility]

## 2024-02-29 ENCOUNTER — Ambulatory Visit: Payer: Self-pay | Admitting: Physician Assistant

## 2024-02-29 DIAGNOSIS — R32 Unspecified urinary incontinence: Secondary | ICD-10-CM | POA: Diagnosis not present

## 2024-02-29 DIAGNOSIS — M62551 Muscle wasting and atrophy, not elsewhere classified, right thigh: Secondary | ICD-10-CM | POA: Diagnosis not present

## 2024-02-29 DIAGNOSIS — R488 Other symbolic dysfunctions: Secondary | ICD-10-CM | POA: Diagnosis not present

## 2024-02-29 DIAGNOSIS — R2689 Other abnormalities of gait and mobility: Secondary | ICD-10-CM | POA: Diagnosis not present

## 2024-02-29 DIAGNOSIS — M62512 Muscle wasting and atrophy, not elsewhere classified, left shoulder: Secondary | ICD-10-CM | POA: Diagnosis not present

## 2024-02-29 DIAGNOSIS — M62511 Muscle wasting and atrophy, not elsewhere classified, right shoulder: Secondary | ICD-10-CM | POA: Diagnosis not present

## 2024-02-29 DIAGNOSIS — R2681 Unsteadiness on feet: Secondary | ICD-10-CM | POA: Diagnosis not present

## 2024-02-29 DIAGNOSIS — M62552 Muscle wasting and atrophy, not elsewhere classified, left thigh: Secondary | ICD-10-CM | POA: Diagnosis not present

## 2024-02-29 DIAGNOSIS — R42 Dizziness and giddiness: Secondary | ICD-10-CM | POA: Diagnosis not present

## 2024-02-29 DIAGNOSIS — Z9181 History of falling: Secondary | ICD-10-CM | POA: Diagnosis not present

## 2024-03-01 ENCOUNTER — Ambulatory Visit (INDEPENDENT_AMBULATORY_CARE_PROVIDER_SITE_OTHER): Admitting: Psychology

## 2024-03-01 DIAGNOSIS — R32 Unspecified urinary incontinence: Secondary | ICD-10-CM | POA: Diagnosis not present

## 2024-03-01 DIAGNOSIS — F411 Generalized anxiety disorder: Secondary | ICD-10-CM

## 2024-03-01 DIAGNOSIS — M62512 Muscle wasting and atrophy, not elsewhere classified, left shoulder: Secondary | ICD-10-CM | POA: Diagnosis not present

## 2024-03-01 DIAGNOSIS — F325 Major depressive disorder, single episode, in full remission: Secondary | ICD-10-CM

## 2024-03-01 DIAGNOSIS — M62511 Muscle wasting and atrophy, not elsewhere classified, right shoulder: Secondary | ICD-10-CM | POA: Diagnosis not present

## 2024-03-01 DIAGNOSIS — R4789 Other speech disturbances: Secondary | ICD-10-CM | POA: Diagnosis not present

## 2024-03-01 DIAGNOSIS — R41841 Cognitive communication deficit: Secondary | ICD-10-CM | POA: Diagnosis not present

## 2024-03-01 DIAGNOSIS — R488 Other symbolic dysfunctions: Secondary | ICD-10-CM | POA: Diagnosis not present

## 2024-03-01 DIAGNOSIS — R4185 Anosognosia: Secondary | ICD-10-CM | POA: Diagnosis not present

## 2024-03-01 NOTE — Progress Notes (Unsigned)
 [facility] Counselor/Therapist Progress Note  Patient ID: [name], MRN: [id number]    Date: 03/01/24  Time Spent: 1:00 pm - 1:53 pm :  53 inutes  Treatment Type: Individual Therapy.  Reported Symptoms: Anxiety and depression  Mental Status Exam: Appearance:  Well Groomed     Behavior: Appropriate  Motor: Normal  Speech/Language:  Normal Rate  Affect: Appropriate  Mood: sad  Thought process: confused  Thought content:   WNL  Sensory/Perceptual disturbances:   WNL  Orientation: oriented to person, place, and time/date  Attention: Good  Concentration: Fair  Memory: Impaired cognitive ability  Fund of knowledge:  Good  Insight:   Good  Judgment:  Fair  Impulse Control: Good   Risk Assessment: Danger to Self:  No Self-injurious Behavior: No Danger to Others: No Duty to Warn:no Physical Aggression / Violence:No  Access to Firearms a concern: No  Gang Involvement:No   Subjective:   [name] participated in the session, in person in the office with the therapist, and consented to treatment. [name] reviewed the events of the past week.   Discussed some steps patient could take to connect with family members.  Writing letter to [name] - even though it will break patient's heart if he doesn't write back to patient.  Writing to [name], patient's daughter, would also break patient's heart if she didn't respond to patient's letter.  3.   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  Interventions: Cognitive Behavioral Therapy  Diagnosis:   Major depressive disorder in remission, unspecified whether recurrent (HCC) [F32.5]  GAD (generalized anxiety disorder) [F41.1  Psychiatric Treatment: Yes , via PCP  Treatment Plan:  Client Abilities/Strengths [name] is bright, expressive, verbally fluent, engaging, and very motivated for change   Support System: Adult children and  friends  Client Treatment Preferences OPT  Client Statement of Needs [name] would like to set additional goals, do more self-care, learn more about her symptoms, increase her mindfulness, organize her thoughts in between sessions, and share her worries so she will feel better.         Treatment Level Biweekly  Symptoms Anxiety: feeling nervous and on edge, not being able to control worrying, trouble relaxing, becoming easily annoyed and irritable, feeling afraid as if something awful might happen. (Status: maintained) Depression: feeling down, feeling tired, and feeling like letting family down. (Status: maintained)  Goals:   [name] experiences symptoms of anxiety and depression   Target Date: 09/07/24 Frequency: Biweekly  Progress: 0 Modality: individual    Therapist will provide referrals for additional resources as appropriate.  Therapist will provide psycho-education regarding [name]'s diagnosis and corresponding treatment approaches and interventions. [name] will support the patient's ability to achieve the goals identified. will employ CBT, BA, Problem-solving, Solution Focused, Mindfulness,  coping skills, & other evidenced-based practices will be used to promote progress towards healthy functioning to help manage decrease symptoms associated with their diagnosis.   Reduce overall level, frequency, and intensity of the feelings of depression, anxiety and panic evidenced by decreased overall symptoms from 6 to 7 days/week to 0 to 1 days/week per client report for at least 3 consecutive months. Verbally express understanding of the relationship between feelings of depression and anxiety and their impact on thinking patterns and behaviors. Verbalize an understanding of the role that distorted thinking plays in creating fears, excessive worry, and ruminations.  ([name] participated in the creation of the treatment plan)  [name]

## 2024-03-02 ENCOUNTER — Ambulatory Visit: Payer: Medicare PPO | Admitting: Physician Assistant

## 2024-03-02 DIAGNOSIS — Z9181 History of falling: Secondary | ICD-10-CM | POA: Diagnosis not present

## 2024-03-02 DIAGNOSIS — M62552 Muscle wasting and atrophy, not elsewhere classified, left thigh: Secondary | ICD-10-CM | POA: Diagnosis not present

## 2024-03-02 DIAGNOSIS — M62551 Muscle wasting and atrophy, not elsewhere classified, right thigh: Secondary | ICD-10-CM | POA: Diagnosis not present

## 2024-03-02 DIAGNOSIS — R2689 Other abnormalities of gait and mobility: Secondary | ICD-10-CM | POA: Diagnosis not present

## 2024-03-02 DIAGNOSIS — R42 Dizziness and giddiness: Secondary | ICD-10-CM | POA: Diagnosis not present

## 2024-03-02 DIAGNOSIS — R2681 Unsteadiness on feet: Secondary | ICD-10-CM | POA: Diagnosis not present

## 2024-03-04 ENCOUNTER — Ambulatory Visit: Admitting: Psychology

## 2024-03-07 DIAGNOSIS — M62512 Muscle wasting and atrophy, not elsewhere classified, left shoulder: Secondary | ICD-10-CM | POA: Diagnosis not present

## 2024-03-07 DIAGNOSIS — M62511 Muscle wasting and atrophy, not elsewhere classified, right shoulder: Secondary | ICD-10-CM | POA: Diagnosis not present

## 2024-03-07 DIAGNOSIS — R488 Other symbolic dysfunctions: Secondary | ICD-10-CM | POA: Diagnosis not present

## 2024-03-07 DIAGNOSIS — R32 Unspecified urinary incontinence: Secondary | ICD-10-CM | POA: Diagnosis not present

## 2024-03-09 DIAGNOSIS — M62551 Muscle wasting and atrophy, not elsewhere classified, right thigh: Secondary | ICD-10-CM | POA: Diagnosis not present

## 2024-03-09 DIAGNOSIS — Z9181 History of falling: Secondary | ICD-10-CM | POA: Diagnosis not present

## 2024-03-09 DIAGNOSIS — R42 Dizziness and giddiness: Secondary | ICD-10-CM | POA: Diagnosis not present

## 2024-03-09 DIAGNOSIS — R2681 Unsteadiness on feet: Secondary | ICD-10-CM | POA: Diagnosis not present

## 2024-03-09 DIAGNOSIS — M62552 Muscle wasting and atrophy, not elsewhere classified, left thigh: Secondary | ICD-10-CM | POA: Diagnosis not present

## 2024-03-09 DIAGNOSIS — R2689 Other abnormalities of gait and mobility: Secondary | ICD-10-CM | POA: Diagnosis not present

## 2024-03-10 DIAGNOSIS — R2681 Unsteadiness on feet: Secondary | ICD-10-CM | POA: Diagnosis not present

## 2024-03-10 DIAGNOSIS — R42 Dizziness and giddiness: Secondary | ICD-10-CM | POA: Diagnosis not present

## 2024-03-10 DIAGNOSIS — Z9181 History of falling: Secondary | ICD-10-CM | POA: Diagnosis not present

## 2024-03-10 DIAGNOSIS — R32 Unspecified urinary incontinence: Secondary | ICD-10-CM | POA: Diagnosis not present

## 2024-03-10 DIAGNOSIS — M62551 Muscle wasting and atrophy, not elsewhere classified, right thigh: Secondary | ICD-10-CM | POA: Diagnosis not present

## 2024-03-10 DIAGNOSIS — M62512 Muscle wasting and atrophy, not elsewhere classified, left shoulder: Secondary | ICD-10-CM | POA: Diagnosis not present

## 2024-03-10 DIAGNOSIS — M62511 Muscle wasting and atrophy, not elsewhere classified, right shoulder: Secondary | ICD-10-CM | POA: Diagnosis not present

## 2024-03-10 DIAGNOSIS — R488 Other symbolic dysfunctions: Secondary | ICD-10-CM | POA: Diagnosis not present

## 2024-03-10 DIAGNOSIS — M62552 Muscle wasting and atrophy, not elsewhere classified, left thigh: Secondary | ICD-10-CM | POA: Diagnosis not present

## 2024-03-10 DIAGNOSIS — R2689 Other abnormalities of gait and mobility: Secondary | ICD-10-CM | POA: Diagnosis not present

## 2024-03-11 DIAGNOSIS — M62552 Muscle wasting and atrophy, not elsewhere classified, left thigh: Secondary | ICD-10-CM | POA: Diagnosis not present

## 2024-03-11 DIAGNOSIS — R2689 Other abnormalities of gait and mobility: Secondary | ICD-10-CM | POA: Diagnosis not present

## 2024-03-11 DIAGNOSIS — M62551 Muscle wasting and atrophy, not elsewhere classified, right thigh: Secondary | ICD-10-CM | POA: Diagnosis not present

## 2024-03-11 DIAGNOSIS — R42 Dizziness and giddiness: Secondary | ICD-10-CM | POA: Diagnosis not present

## 2024-03-11 DIAGNOSIS — R2681 Unsteadiness on feet: Secondary | ICD-10-CM | POA: Diagnosis not present

## 2024-03-11 DIAGNOSIS — Z9181 History of falling: Secondary | ICD-10-CM | POA: Diagnosis not present

## 2024-03-14 DIAGNOSIS — M62511 Muscle wasting and atrophy, not elsewhere classified, right shoulder: Secondary | ICD-10-CM | POA: Diagnosis not present

## 2024-03-14 DIAGNOSIS — M62512 Muscle wasting and atrophy, not elsewhere classified, left shoulder: Secondary | ICD-10-CM | POA: Diagnosis not present

## 2024-03-14 DIAGNOSIS — R488 Other symbolic dysfunctions: Secondary | ICD-10-CM | POA: Diagnosis not present

## 2024-03-14 DIAGNOSIS — R32 Unspecified urinary incontinence: Secondary | ICD-10-CM | POA: Diagnosis not present

## 2024-03-16 DIAGNOSIS — Z9181 History of falling: Secondary | ICD-10-CM | POA: Diagnosis not present

## 2024-03-16 DIAGNOSIS — M62552 Muscle wasting and atrophy, not elsewhere classified, left thigh: Secondary | ICD-10-CM | POA: Diagnosis not present

## 2024-03-16 DIAGNOSIS — R2689 Other abnormalities of gait and mobility: Secondary | ICD-10-CM | POA: Diagnosis not present

## 2024-03-16 DIAGNOSIS — R2681 Unsteadiness on feet: Secondary | ICD-10-CM | POA: Diagnosis not present

## 2024-03-16 DIAGNOSIS — R42 Dizziness and giddiness: Secondary | ICD-10-CM | POA: Diagnosis not present

## 2024-03-16 DIAGNOSIS — M62551 Muscle wasting and atrophy, not elsewhere classified, right thigh: Secondary | ICD-10-CM | POA: Diagnosis not present

## 2024-03-17 DIAGNOSIS — R32 Unspecified urinary incontinence: Secondary | ICD-10-CM | POA: Diagnosis not present

## 2024-03-17 DIAGNOSIS — R488 Other symbolic dysfunctions: Secondary | ICD-10-CM | POA: Diagnosis not present

## 2024-03-17 DIAGNOSIS — M62511 Muscle wasting and atrophy, not elsewhere classified, right shoulder: Secondary | ICD-10-CM | POA: Diagnosis not present

## 2024-03-17 DIAGNOSIS — M62512 Muscle wasting and atrophy, not elsewhere classified, left shoulder: Secondary | ICD-10-CM | POA: Diagnosis not present

## 2024-03-18 ENCOUNTER — Ambulatory Visit: Admitting: Psychology

## 2024-03-21 DIAGNOSIS — R2681 Unsteadiness on feet: Secondary | ICD-10-CM | POA: Diagnosis not present

## 2024-03-21 DIAGNOSIS — Z9181 History of falling: Secondary | ICD-10-CM | POA: Diagnosis not present

## 2024-03-21 DIAGNOSIS — M62551 Muscle wasting and atrophy, not elsewhere classified, right thigh: Secondary | ICD-10-CM | POA: Diagnosis not present

## 2024-03-21 DIAGNOSIS — M62552 Muscle wasting and atrophy, not elsewhere classified, left thigh: Secondary | ICD-10-CM | POA: Diagnosis not present

## 2024-03-21 DIAGNOSIS — R2689 Other abnormalities of gait and mobility: Secondary | ICD-10-CM | POA: Diagnosis not present

## 2024-03-21 DIAGNOSIS — R42 Dizziness and giddiness: Secondary | ICD-10-CM | POA: Diagnosis not present

## 2024-03-22 ENCOUNTER — Ambulatory Visit (INDEPENDENT_AMBULATORY_CARE_PROVIDER_SITE_OTHER): Admitting: Psychology

## 2024-03-22 DIAGNOSIS — F411 Generalized anxiety disorder: Secondary | ICD-10-CM | POA: Diagnosis not present

## 2024-03-22 DIAGNOSIS — F325 Major depressive disorder, single episode, in full remission: Secondary | ICD-10-CM | POA: Diagnosis not present

## 2024-03-22 DIAGNOSIS — R32 Unspecified urinary incontinence: Secondary | ICD-10-CM | POA: Diagnosis not present

## 2024-03-22 DIAGNOSIS — R488 Other symbolic dysfunctions: Secondary | ICD-10-CM | POA: Diagnosis not present

## 2024-03-22 DIAGNOSIS — M62512 Muscle wasting and atrophy, not elsewhere classified, left shoulder: Secondary | ICD-10-CM | POA: Diagnosis not present

## 2024-03-22 DIAGNOSIS — M62511 Muscle wasting and atrophy, not elsewhere classified, right shoulder: Secondary | ICD-10-CM | POA: Diagnosis not present

## 2024-03-22 NOTE — Progress Notes (Signed)
 [facility] Counselor/Therapist Progress Note  Patient ID: [name], MRN: [id number]    Date: 03/22/24  Time Spent: 12:00 pm - 12:53 pm : 53 minutes (We met by phone due to patient being sick.)  Treatment Type: Individual Therapy.  Reported Symptoms: Anxiety and depression  Mental Status Exam: Appearance:  Well Groomed     Behavior: Appropriate  Motor: Normal  Speech/Language:  Normal Rate  Affect: Appropriate  Mood: normal  Thought process: normal  Thought content:   WNL  Sensory/Perceptual disturbances:   WNL  Orientation: oriented to person, place, and time/date  Attention: Good  Concentration: Good  Memory: WNL  Fund of knowledge:  Good  Insight:   Good  Judgment:  Good  Impulse Control: Good   Risk Assessment: Danger to Self:  No Self-injurious Behavior: No Danger to Others: No Duty to Warn:no Physical Aggression / Violence:No  Access to Firearms a concern: No  Gang Involvement:No   Subjective:   [name] participated from home, via phone, and consented to treatment. I discussed the limitations of evaluation and management by telemedicine and the availability of in person appointments. The patient expressed understanding and agreed to proceed.  Therapist participated from home office.  [name] reviewed the events of the past week.   Patient was tearful throughout our session and stated that she was recovering from having diarrhea for 5 days! Patient was feeling better but still not 100%. Patient was very sad about her sister [name] who lives in [place] , because she misses her so much. We discussed if the patient could have assistance with seeing her sister [name]. [name] is a company that works closely with [employer].   Interventions: Cognitive Behavioral Therapy  Diagnosis:  Major depressive disorder in remission, unspecified whether recurrent (HCC) [F32.5]  GAD (generalized anxiety disorder) [F41.1  Psychiatric Treatment:  Yes , via PCP  Treatment Plan:  Client Abilities/Strengths [name] is expressive and engaging.  Support System:  Adult children and friends  Client Treatment Preferences OPT  Client Statement of Needs [name] would like to share her worries so she will feel better.    Treatment Level Biweekly  Symptoms  [name] experiences symptoms of anxiety and depression.   Goals:   Target Date: 09/07/24 Frequency: Biweekly  Progress: 0 Modality: individual    Therapist will provide referrals for additional resources as appropriate.  Therapist will provide psycho-education regarding [name]'s diagnosis and corresponding treatment approaches and interventions. [name] will support the patient's ability to achieve the goals identified. will employ CBT, BA, Problem-solving, Solution Focused, Mindfulness,  coping skills, & other evidenced-based practices will be used to promote progress towards healthy functioning to help manage decrease symptoms associated with their diagnosis.   Reduce overall level, frequency, and intensity of the feelings of depression, anxiety and panic evidenced by decreased overall symptoms from 6 to 7 days/week to 0 to 1 days/week per client report for at least 3 consecutive months. Verbally express understanding of the relationship between feelings of depression and anxiety and their impact on thinking patterns and behaviors. Verbalize an understanding of the role that distorted thinking plays in creating fears, excessive worry, and ruminations.  ([name] participated in the creation of the treatment plan)    [name]

## 2024-03-23 DIAGNOSIS — R2689 Other abnormalities of gait and mobility: Secondary | ICD-10-CM | POA: Diagnosis not present

## 2024-03-23 DIAGNOSIS — R42 Dizziness and giddiness: Secondary | ICD-10-CM | POA: Diagnosis not present

## 2024-03-23 DIAGNOSIS — R2681 Unsteadiness on feet: Secondary | ICD-10-CM | POA: Diagnosis not present

## 2024-03-23 DIAGNOSIS — M62552 Muscle wasting and atrophy, not elsewhere classified, left thigh: Secondary | ICD-10-CM | POA: Diagnosis not present

## 2024-03-23 DIAGNOSIS — M62551 Muscle wasting and atrophy, not elsewhere classified, right thigh: Secondary | ICD-10-CM | POA: Diagnosis not present

## 2024-03-23 DIAGNOSIS — Z9181 History of falling: Secondary | ICD-10-CM | POA: Diagnosis not present

## 2024-03-24 DIAGNOSIS — M62512 Muscle wasting and atrophy, not elsewhere classified, left shoulder: Secondary | ICD-10-CM | POA: Diagnosis not present

## 2024-03-24 DIAGNOSIS — M62511 Muscle wasting and atrophy, not elsewhere classified, right shoulder: Secondary | ICD-10-CM | POA: Diagnosis not present

## 2024-03-24 DIAGNOSIS — R488 Other symbolic dysfunctions: Secondary | ICD-10-CM | POA: Diagnosis not present

## 2024-03-24 DIAGNOSIS — R32 Unspecified urinary incontinence: Secondary | ICD-10-CM | POA: Diagnosis not present

## 2024-03-25 DIAGNOSIS — M62511 Muscle wasting and atrophy, not elsewhere classified, right shoulder: Secondary | ICD-10-CM | POA: Diagnosis not present

## 2024-03-25 DIAGNOSIS — R488 Other symbolic dysfunctions: Secondary | ICD-10-CM | POA: Diagnosis not present

## 2024-03-25 DIAGNOSIS — M62512 Muscle wasting and atrophy, not elsewhere classified, left shoulder: Secondary | ICD-10-CM | POA: Diagnosis not present

## 2024-03-25 DIAGNOSIS — R32 Unspecified urinary incontinence: Secondary | ICD-10-CM | POA: Diagnosis not present

## 2024-03-29 DIAGNOSIS — R2689 Other abnormalities of gait and mobility: Secondary | ICD-10-CM | POA: Diagnosis not present

## 2024-03-29 DIAGNOSIS — Z9181 History of falling: Secondary | ICD-10-CM | POA: Diagnosis not present

## 2024-03-29 DIAGNOSIS — R42 Dizziness and giddiness: Secondary | ICD-10-CM | POA: Diagnosis not present

## 2024-03-29 DIAGNOSIS — M62551 Muscle wasting and atrophy, not elsewhere classified, right thigh: Secondary | ICD-10-CM | POA: Diagnosis not present

## 2024-03-29 DIAGNOSIS — M62552 Muscle wasting and atrophy, not elsewhere classified, left thigh: Secondary | ICD-10-CM | POA: Diagnosis not present

## 2024-03-29 DIAGNOSIS — R2681 Unsteadiness on feet: Secondary | ICD-10-CM | POA: Diagnosis not present

## 2024-03-30 DIAGNOSIS — R42 Dizziness and giddiness: Secondary | ICD-10-CM | POA: Diagnosis not present

## 2024-03-30 DIAGNOSIS — R2689 Other abnormalities of gait and mobility: Secondary | ICD-10-CM | POA: Diagnosis not present

## 2024-03-30 DIAGNOSIS — M62552 Muscle wasting and atrophy, not elsewhere classified, left thigh: Secondary | ICD-10-CM | POA: Diagnosis not present

## 2024-03-30 DIAGNOSIS — M62551 Muscle wasting and atrophy, not elsewhere classified, right thigh: Secondary | ICD-10-CM | POA: Diagnosis not present

## 2024-03-30 DIAGNOSIS — R2681 Unsteadiness on feet: Secondary | ICD-10-CM | POA: Diagnosis not present

## 2024-03-30 DIAGNOSIS — Z9181 History of falling: Secondary | ICD-10-CM | POA: Diagnosis not present

## 2024-03-31 ENCOUNTER — Telehealth: Payer: Self-pay | Admitting: Family Medicine

## 2024-03-31 ENCOUNTER — Ambulatory Visit: Payer: Self-pay

## 2024-03-31 DIAGNOSIS — K219 Gastro-esophageal reflux disease without esophagitis: Secondary | ICD-10-CM | POA: Diagnosis not present

## 2024-03-31 DIAGNOSIS — M62511 Muscle wasting and atrophy, not elsewhere classified, right shoulder: Secondary | ICD-10-CM | POA: Diagnosis not present

## 2024-03-31 DIAGNOSIS — M62512 Muscle wasting and atrophy, not elsewhere classified, left shoulder: Secondary | ICD-10-CM | POA: Diagnosis not present

## 2024-03-31 DIAGNOSIS — R32 Unspecified urinary incontinence: Secondary | ICD-10-CM | POA: Diagnosis not present

## 2024-03-31 DIAGNOSIS — R488 Other symbolic dysfunctions: Secondary | ICD-10-CM | POA: Diagnosis not present

## 2024-03-31 DIAGNOSIS — R197 Diarrhea, unspecified: Secondary | ICD-10-CM | POA: Diagnosis not present

## 2024-03-31 DIAGNOSIS — B379 Candidiasis, unspecified: Secondary | ICD-10-CM | POA: Diagnosis not present

## 2024-03-31 DIAGNOSIS — G4700 Insomnia, unspecified: Secondary | ICD-10-CM | POA: Diagnosis not present

## 2024-03-31 DIAGNOSIS — N390 Urinary tract infection, site not specified: Secondary | ICD-10-CM | POA: Diagnosis not present

## 2024-03-31 NOTE — Telephone Encounter (Signed)
 I did speak with patient today 03/31/2024 to schedule appt for 04/26/2024. I will let Dr. Katrinka know the severity of the memory loss when he is back in office Monday.

## 2024-03-31 NOTE — Telephone Encounter (Signed)
 FYI Only or Action Required?: Action required by provider: Call patient, patient does not remember speaking to clinic staff about scheduled appt today for 04/26/2024.  Patient was last seen in primary care on 01/29/2024 by Katrinka Garnette KIDD, MD.  Called Nurse Triage reporting Memory Loss.  Symptoms began several weeks ago.  Interventions attempted: Prescription medications: daily medications.  Symptoms are: unchanged.  Triage Disposition: Call PCP When Office is Open  Patient/caregiver understands and will follow disposition?: Yes     Copied from CRM [id number]. Topic: Clinical - Red Word Triage >> Mar 31, 2024  2:51 PM Jayma L wrote: Red Word that prompted transfer to Nurse Triage:  Forgetful and some confusion , cant drive , confusion is getting worse , going on a few months Reason for Disposition  [1] Caller has NON-URGENT question (includes prescribed medication questions) AND [2] triager unable to answer    Noted in chart, pt spoke to clinic regarding medication request, appt already scheduled for 04/26/24. Informed patient of scheduled appt and will send encounter to clinic.  Answer Assessment - Initial Assessment Questions This RN was attempted to schedule appt, noticed that appt was made for pt today, for 04/26/24; noted that [place] spoke with pt today; patient does not remember the conversation or appt.  Will call [facility] to clarify if staff spoke with patient.  Called [facility], spoke with Inocente, informed that patient did not remember conversation or appt. Inocente states will forward message to [place].    1. MAIN CONCERN OR SYMPTOM:  What is your main concern right now? What questions do you have? What's the main symptom you're worried about? (e.g., confusion, memory loss)     Just can't remember things, I know where I am; just want to be able to remember important things 2. ONSET:  When did the symptom start (or worsen)? (minutes, hours, days, weeks)     Been going on for a  while, but I'm at the poitn where I need to talk to doctor 3. BETTER-SAME-WORSE: Are you (the patient) getting better, staying the same, or getting worse compared to the day you (they) were diagnosed or most recent hospital discharge?     When I start to think about something and can't think of it 4. DIAGNOSIS: Was the dementia diagnosed by a doctor? If Yes, ask: When? (e.g., days, months, years ago)     No 5. MEDICINES: Has there been any change in medicines recently? (e.g., narcotics, antihistamines, benzodiazepines, etc.)     Cephalaxin new med, started today. 6. OTHER SYMPTOMS: Are there any other symptoms? (e.g., cough, falling, fever, pain)     Balancing not that good, had fallen several times in the past and hit my head; it bleed before. No recent head inury in last year.   Starting to have headaches; stared couple of weeks ago, currently 2/10, in same area where I hit my head years ago.  Patient reports no recent imaging since that last fall over year ago and history of migraines.  7. SUPPORT: What type of support do you (the patient) have? Note: Document living circumstances and support (e.g., family, nursing home).     Retire home; independent care.  Protocols used: Dementia Symptoms and Questions-A-AH

## 2024-03-31 NOTE — Telephone Encounter (Signed)
 Called patient and spoke with her regarding her request for new medication to help dementia. Scheduled patient for an office visit with Dr. Katrinka 04/26/2024 at 2pm. Pt verbalized understanding and no further questions at this time.    Copied from CRM #[id number]. Topic: Clinical - Medical Advice >> Mar 31, 2024  2:41 PM [place] wrote: Reason for CRM: Patient wanting to speak to Dr Katrinka or a nurse regarding a medication for Dementia

## 2024-04-01 ENCOUNTER — Ambulatory Visit (INDEPENDENT_AMBULATORY_CARE_PROVIDER_SITE_OTHER): Admitting: Psychology

## 2024-04-01 DIAGNOSIS — F325 Major depressive disorder, single episode, in full remission: Secondary | ICD-10-CM | POA: Diagnosis not present

## 2024-04-01 DIAGNOSIS — N390 Urinary tract infection, site not specified: Secondary | ICD-10-CM | POA: Diagnosis not present

## 2024-04-01 DIAGNOSIS — F411 Generalized anxiety disorder: Secondary | ICD-10-CM

## 2024-04-01 NOTE — Progress Notes (Signed)
 [facility] Counselor/Therapist Progress Note  Patient ID: [name], MRN: [id number]    Date: 04/01/24  Time Spent: 10:00 am - 10:53 : 53 minutes  Treatment Type: Individual Therapy.  Reported Symptoms: Anxiety and depression  Mental Status Exam: Appearance:  Well Groomed     Behavior: Appropriate  Motor: Normal  Speech/Language:  Normal Rate  Affect: Appropriate  Mood: normal  Thought process: normal  Thought content:   WNL  Sensory/Perceptual disturbances:   WNL  Orientation: oriented to person, place, and time/date  Attention: Good  Concentration: Good  Memory: WNL  Fund of knowledge:  Good  Insight:   Good  Judgment:  Good  Impulse Control: Good   Risk Assessment: Danger to Self:  No Self-injurious Behavior: No Danger to Others: No Duty to Warn:no Physical Aggression / Violence:No  Access to Firearms a concern: No  Gang Involvement:No   Subjective:   [name] participated in the session, in person in the office with the therapist, and consented to treatment. [name] reviewed the events of the past week.   Patient reported missing her family members and  having difficulty not thinking about how much she misses them. Patient was receptive to talking with her mentors at [employer] about her feelings and writing down her thoughts and feelings to share with this Clinical research associate.   Interventions: Cognitive Behavioral Therapy  Diagnosis:  Major depressive disorder in remission, unspecified whether recurrent (HCC) [F32.5]  GAD (generalized anxiety disorder) [F41.1   Psychiatric Treatment: Yes , via PCP  Treatment Plan:  Client Abilities/Strengths [name] (preferred name) is expressive and engaging.   Support System: Adult children and friends  Client Treatment Preferences OPT  Client Statement of Needs [name] would like to share my worries so I'll feel better.   Treatment Level Biweekly  Symptoms  [name] experiences  symptoms of anxiety and depression.   Goals:  Target Date: 09/07/24 Frequency: Biweekly  Progress: 0 Modality: individual    Therapist will provide referrals for additional resources as appropriate.  Therapist will provide psycho-education regarding [name]'s diagnosis and corresponding treatment approaches and interventions. [name] will support the patient's ability to achieve the goals identified. will employ CBT, BA, Problem-solving, Solution Focused, Mindfulness,  coping skills, & other evidenced-based practices will be used to promote progress towards healthy functioning to help manage decrease symptoms associated with their diagnosis.   Reduce overall level, frequency, and intensity of the feelings of depression, anxiety and panic evidenced by decreased overall symptoms from 6 to 7 days/week to 0 to 1 days/week per client report for at least 3 consecutive months. Verbally express understanding of the relationship between feelings of depression and anxiety and their impact on thinking patterns and behaviors. Verbalize an understanding of the role that distorted thinking plays in creating fears, excessive worry, and ruminations.  ([name] participated in the creation of the treatment plan)    [name]

## 2024-04-01 NOTE — Telephone Encounter (Signed)
 She has known dementia.  Perhaps we can help schedule her follow-up with neurology as well-they had mentioned 6 months and she is overdue about a month

## 2024-04-04 DIAGNOSIS — R2689 Other abnormalities of gait and mobility: Secondary | ICD-10-CM | POA: Diagnosis not present

## 2024-04-04 DIAGNOSIS — R42 Dizziness and giddiness: Secondary | ICD-10-CM | POA: Diagnosis not present

## 2024-04-04 DIAGNOSIS — R2681 Unsteadiness on feet: Secondary | ICD-10-CM | POA: Diagnosis not present

## 2024-04-04 DIAGNOSIS — M62551 Muscle wasting and atrophy, not elsewhere classified, right thigh: Secondary | ICD-10-CM | POA: Diagnosis not present

## 2024-04-04 DIAGNOSIS — Z9181 History of falling: Secondary | ICD-10-CM | POA: Diagnosis not present

## 2024-04-04 DIAGNOSIS — M62552 Muscle wasting and atrophy, not elsewhere classified, left thigh: Secondary | ICD-10-CM | POA: Diagnosis not present

## 2024-04-05 DIAGNOSIS — M62512 Muscle wasting and atrophy, not elsewhere classified, left shoulder: Secondary | ICD-10-CM | POA: Diagnosis not present

## 2024-04-05 DIAGNOSIS — M62511 Muscle wasting and atrophy, not elsewhere classified, right shoulder: Secondary | ICD-10-CM | POA: Diagnosis not present

## 2024-04-05 DIAGNOSIS — R32 Unspecified urinary incontinence: Secondary | ICD-10-CM | POA: Diagnosis not present

## 2024-04-05 DIAGNOSIS — Z792 Long term (current) use of antibiotics: Secondary | ICD-10-CM | POA: Diagnosis not present

## 2024-04-05 DIAGNOSIS — N390 Urinary tract infection, site not specified: Secondary | ICD-10-CM | POA: Diagnosis not present

## 2024-04-05 DIAGNOSIS — R488 Other symbolic dysfunctions: Secondary | ICD-10-CM | POA: Diagnosis not present

## 2024-04-06 DIAGNOSIS — R2681 Unsteadiness on feet: Secondary | ICD-10-CM | POA: Diagnosis not present

## 2024-04-06 DIAGNOSIS — R488 Other symbolic dysfunctions: Secondary | ICD-10-CM | POA: Diagnosis not present

## 2024-04-06 DIAGNOSIS — Z9181 History of falling: Secondary | ICD-10-CM | POA: Diagnosis not present

## 2024-04-06 DIAGNOSIS — M62511 Muscle wasting and atrophy, not elsewhere classified, right shoulder: Secondary | ICD-10-CM | POA: Diagnosis not present

## 2024-04-06 DIAGNOSIS — M62552 Muscle wasting and atrophy, not elsewhere classified, left thigh: Secondary | ICD-10-CM | POA: Diagnosis not present

## 2024-04-06 DIAGNOSIS — R32 Unspecified urinary incontinence: Secondary | ICD-10-CM | POA: Diagnosis not present

## 2024-04-06 DIAGNOSIS — M62551 Muscle wasting and atrophy, not elsewhere classified, right thigh: Secondary | ICD-10-CM | POA: Diagnosis not present

## 2024-04-06 DIAGNOSIS — M62512 Muscle wasting and atrophy, not elsewhere classified, left shoulder: Secondary | ICD-10-CM | POA: Diagnosis not present

## 2024-04-06 DIAGNOSIS — R2689 Other abnormalities of gait and mobility: Secondary | ICD-10-CM | POA: Diagnosis not present

## 2024-04-06 DIAGNOSIS — R42 Dizziness and giddiness: Secondary | ICD-10-CM | POA: Diagnosis not present

## 2024-04-11 DIAGNOSIS — R2681 Unsteadiness on feet: Secondary | ICD-10-CM | POA: Diagnosis not present

## 2024-04-11 DIAGNOSIS — R42 Dizziness and giddiness: Secondary | ICD-10-CM | POA: Diagnosis not present

## 2024-04-11 DIAGNOSIS — Z9181 History of falling: Secondary | ICD-10-CM | POA: Diagnosis not present

## 2024-04-11 DIAGNOSIS — M62551 Muscle wasting and atrophy, not elsewhere classified, right thigh: Secondary | ICD-10-CM | POA: Diagnosis not present

## 2024-04-11 DIAGNOSIS — M62552 Muscle wasting and atrophy, not elsewhere classified, left thigh: Secondary | ICD-10-CM | POA: Diagnosis not present

## 2024-04-11 DIAGNOSIS — R2689 Other abnormalities of gait and mobility: Secondary | ICD-10-CM | POA: Diagnosis not present

## 2024-04-12 ENCOUNTER — Ambulatory Visit: Payer: Self-pay

## 2024-04-12 NOTE — Telephone Encounter (Signed)
 FYI Only or Action Required?: FYI only for provider.  Patient was last seen in primary care on 01/29/2024 by Katrinka Garnette KIDD, MD.  Called Nurse Triage reporting Medication Reaction.  Symptoms began several days ago.  Interventions attempted: Rest, hydration, or home remedies.  Symptoms are: stable.  Triage Disposition: Call PCP When Office is Open  Patient/caregiver understands and will follow disposition?: Yes      Copied from CRM #[id number]. Topic: Clinical - Medication Question >> Apr 12, 2024 10:55 AM Macario HERO wrote: Reason for CRM: Patient is calling requesting  a call from her provider regarding the medication that she's taking because she is having bad dreams and experiencing other symptoms. Reason for Disposition  [1] Caller has NON-URGENT medicine question about med that PCP prescribed AND [2] triager unable to answer question  Answer Assessment - Initial Assessment Questions Patient states she is unsure of why she is taking the medication. Pt asked if she was recently diagnosed with any bacterial infections and states she has a hx of bladder infections but she is unsure. Denies chest pain, or difficulty breathing. Pt states she always has some shortness of breath. Patient states she would also like to know why she is currently taking medications that are given to her in a blister pack from [facility]. Will route to office for follow up.    1. NAME of MEDICINE: What medicine(s) are you calling about?     Levofloxacin 500 mg  and a probiotic 10 ultra strength natures bounty   2. QUESTION: What is your question? (e.g., double dose of medicine, side effect)     Patient asking why she was prescribed the medication.  3. PRESCRIBER: Who prescribed the medicine? Reason: if prescribed by specialist, call should be referred to that group.     Delon Ricks NP  4. SYMPTOMS: Do you have any symptoms? If Yes, ask: What symptoms are you having?  How bad are the  symptoms (e.g., mild, moderate, severe)     Bad nightmares, dizziness, and an off balance feeling.  Protocols used: Medication Question Call-A-AH

## 2024-04-12 NOTE — Telephone Encounter (Signed)
 Please see triage note and advise. Patient is scheduled to see you 04/26/2024.

## 2024-04-12 NOTE — Telephone Encounter (Signed)
 Unable to contact patient and this medication is not in her medication list. Jennifer Brienza NP prescribed this for her. Will try to call back again and explain to her she needs to call that office.

## 2024-04-12 NOTE — Telephone Encounter (Signed)
 I do not believe we prescribed medicine I cannot quite tell you did-can she read the prescriber on the bottle?  Does she recall who gave it to her-they should be able to better inform her of reason for taking

## 2024-04-13 DIAGNOSIS — R2689 Other abnormalities of gait and mobility: Secondary | ICD-10-CM | POA: Diagnosis not present

## 2024-04-13 DIAGNOSIS — M62552 Muscle wasting and atrophy, not elsewhere classified, left thigh: Secondary | ICD-10-CM | POA: Diagnosis not present

## 2024-04-13 DIAGNOSIS — R42 Dizziness and giddiness: Secondary | ICD-10-CM | POA: Diagnosis not present

## 2024-04-13 DIAGNOSIS — Z9181 History of falling: Secondary | ICD-10-CM | POA: Diagnosis not present

## 2024-04-13 DIAGNOSIS — M62551 Muscle wasting and atrophy, not elsewhere classified, right thigh: Secondary | ICD-10-CM | POA: Diagnosis not present

## 2024-04-13 DIAGNOSIS — R2681 Unsteadiness on feet: Secondary | ICD-10-CM | POA: Diagnosis not present

## 2024-04-14 DIAGNOSIS — M62512 Muscle wasting and atrophy, not elsewhere classified, left shoulder: Secondary | ICD-10-CM | POA: Diagnosis not present

## 2024-04-14 DIAGNOSIS — R488 Other symbolic dysfunctions: Secondary | ICD-10-CM | POA: Diagnosis not present

## 2024-04-14 DIAGNOSIS — M62511 Muscle wasting and atrophy, not elsewhere classified, right shoulder: Secondary | ICD-10-CM | POA: Diagnosis not present

## 2024-04-14 DIAGNOSIS — R32 Unspecified urinary incontinence: Secondary | ICD-10-CM | POA: Diagnosis not present

## 2024-04-15 ENCOUNTER — Ambulatory Visit (INDEPENDENT_AMBULATORY_CARE_PROVIDER_SITE_OTHER): Admitting: Psychology

## 2024-04-15 DIAGNOSIS — R2689 Other abnormalities of gait and mobility: Secondary | ICD-10-CM | POA: Diagnosis not present

## 2024-04-15 DIAGNOSIS — R32 Unspecified urinary incontinence: Secondary | ICD-10-CM | POA: Diagnosis not present

## 2024-04-15 DIAGNOSIS — F325 Major depressive disorder, single episode, in full remission: Secondary | ICD-10-CM | POA: Diagnosis not present

## 2024-04-15 DIAGNOSIS — F411 Generalized anxiety disorder: Secondary | ICD-10-CM | POA: Diagnosis not present

## 2024-04-15 DIAGNOSIS — M62511 Muscle wasting and atrophy, not elsewhere classified, right shoulder: Secondary | ICD-10-CM | POA: Diagnosis not present

## 2024-04-15 DIAGNOSIS — R2681 Unsteadiness on feet: Secondary | ICD-10-CM | POA: Diagnosis not present

## 2024-04-15 DIAGNOSIS — R488 Other symbolic dysfunctions: Secondary | ICD-10-CM | POA: Diagnosis not present

## 2024-04-15 DIAGNOSIS — M62551 Muscle wasting and atrophy, not elsewhere classified, right thigh: Secondary | ICD-10-CM | POA: Diagnosis not present

## 2024-04-15 DIAGNOSIS — M62552 Muscle wasting and atrophy, not elsewhere classified, left thigh: Secondary | ICD-10-CM | POA: Diagnosis not present

## 2024-04-15 DIAGNOSIS — Z9181 History of falling: Secondary | ICD-10-CM | POA: Diagnosis not present

## 2024-04-15 DIAGNOSIS — R42 Dizziness and giddiness: Secondary | ICD-10-CM | POA: Diagnosis not present

## 2024-04-15 DIAGNOSIS — M62512 Muscle wasting and atrophy, not elsewhere classified, left shoulder: Secondary | ICD-10-CM | POA: Diagnosis not present

## 2024-04-15 NOTE — Progress Notes (Unsigned)
  [facility] Counselor/Therapist Progress Note  Patient ID: CHABELI BARSAMIAN, MRN: [id number]    Date: 04/15/24  Time Spent: 10:02 am - 10:59 am : 57 minutes  Treatment Type: Individual Therapy.  Reported Symptoms:  Anxiety and depression.  Mental Status Exam: Appearance:  NA     Behavior: Confused  Motor: Restlestness  Speech/Language:  Normal Rate  Affect: Tearful  Mood: sad  Thought process: Suspicious  Thought content:   Paranoid Ideation  Sensory/Perceptual disturbances:   WNL  Orientation: oriented to person, place, thing  Attention: Fair  Concentration: Fair  Memory: Impaired cognitive ability  Fund of knowledge:  Fair  Insight:   Fair  Judgment:  Fair  Impulse Control: Fair   Risk Assessment: Danger to Self:  No Self-injurious Behavior: No Danger to Others: No Duty to Warn:no Physical Aggression / Violence:No  Access to Firearms a concern: No  Gang Involvement:No   Subjective:   Mercades C Freeberg participated from home, via phone, and consented to treatment. I discussed the limitations of evaluation and management by telemedicine and the availability of in person appointments. The patient expressed understanding and agreed to proceed.  Therapist participated from home office.  Doyle reviewed the events of the past week.   Patient was concerned about her forgetfulness and she had not left her room to eat or engage with anyone outside her room. This Clinical research associate received verbal permission to talk with patient's OT [name], to share this writer's concern about patient's lack of engagement with others.   Interventions: Cognitive Behavioral Therapy  Diagnosis:  Major depressive disorder in remission, unspecified whether recurrent (HCC) [F32.5]  GAD (generalized anxiety disorder) [F41.]  Psychiatric Treatment: Yes   Treatment Plan:  Client Abilities/Strengths [place] ***  Support System: ***  Client Treatment Preferences ***  Client Statement of  Needs Myiah would like to ***   Treatment Level {Frequency of sessions.:26745}  Symptoms  ***   (Status: {Symptom Status:26744}) ***   (Status: {Symptom Status:26744})  Goals:   Hiliary experiences symptoms of ***   Target Date: *** Frequency: {Frequency of sessions.:26745}  Progress: 0 Modality: individual    Therapist will provide referrals for additional resources as appropriate.  Therapist will provide psycho-education regarding Sharay's diagnosis and corresponding treatment approaches and interventions. [name] will support the patient's ability to achieve the goals identified. will employ CBT, BA, Problem-solving, Solution Focused, Mindfulness,  coping skills, & other evidenced-based practices will be used to promote progress towards healthy functioning to help manage decrease symptoms associated with {his/her/their:21314} diagnosis.   Reduce overall level, frequency, and intensity of the feelings of depression, anxiety and panic evidenced by decreased *** from 6 to 7 days/week to 0 to 1 days/week per client report for at least 3 consecutive months. Verbally express understanding of the relationship between feelings of ***depression, ***anxiety and their impact on thinking patterns and behaviors. Verbalize an understanding of the role that distorted thinking plays in creating fears, excessive worry, and ruminations.  (Falisha participated in the creation of the treatment plan)   [name]

## 2024-04-19 DIAGNOSIS — M62511 Muscle wasting and atrophy, not elsewhere classified, right shoulder: Secondary | ICD-10-CM | POA: Diagnosis not present

## 2024-04-19 DIAGNOSIS — R488 Other symbolic dysfunctions: Secondary | ICD-10-CM | POA: Diagnosis not present

## 2024-04-19 DIAGNOSIS — M62512 Muscle wasting and atrophy, not elsewhere classified, left shoulder: Secondary | ICD-10-CM | POA: Diagnosis not present

## 2024-04-19 DIAGNOSIS — R32 Unspecified urinary incontinence: Secondary | ICD-10-CM | POA: Diagnosis not present

## 2024-04-20 DIAGNOSIS — R2681 Unsteadiness on feet: Secondary | ICD-10-CM | POA: Diagnosis not present

## 2024-04-20 DIAGNOSIS — M62552 Muscle wasting and atrophy, not elsewhere classified, left thigh: Secondary | ICD-10-CM | POA: Diagnosis not present

## 2024-04-20 DIAGNOSIS — M62551 Muscle wasting and atrophy, not elsewhere classified, right thigh: Secondary | ICD-10-CM | POA: Diagnosis not present

## 2024-04-20 DIAGNOSIS — R2689 Other abnormalities of gait and mobility: Secondary | ICD-10-CM | POA: Diagnosis not present

## 2024-04-20 DIAGNOSIS — Z9181 History of falling: Secondary | ICD-10-CM | POA: Diagnosis not present

## 2024-04-20 DIAGNOSIS — R42 Dizziness and giddiness: Secondary | ICD-10-CM | POA: Diagnosis not present

## 2024-04-22 DIAGNOSIS — M62511 Muscle wasting and atrophy, not elsewhere classified, right shoulder: Secondary | ICD-10-CM | POA: Diagnosis not present

## 2024-04-22 DIAGNOSIS — R42 Dizziness and giddiness: Secondary | ICD-10-CM | POA: Diagnosis not present

## 2024-04-22 DIAGNOSIS — M62552 Muscle wasting and atrophy, not elsewhere classified, left thigh: Secondary | ICD-10-CM | POA: Diagnosis not present

## 2024-04-22 DIAGNOSIS — R488 Other symbolic dysfunctions: Secondary | ICD-10-CM | POA: Diagnosis not present

## 2024-04-22 DIAGNOSIS — M62551 Muscle wasting and atrophy, not elsewhere classified, right thigh: Secondary | ICD-10-CM | POA: Diagnosis not present

## 2024-04-22 DIAGNOSIS — R2689 Other abnormalities of gait and mobility: Secondary | ICD-10-CM | POA: Diagnosis not present

## 2024-04-22 DIAGNOSIS — R2681 Unsteadiness on feet: Secondary | ICD-10-CM | POA: Diagnosis not present

## 2024-04-22 DIAGNOSIS — R32 Unspecified urinary incontinence: Secondary | ICD-10-CM | POA: Diagnosis not present

## 2024-04-22 DIAGNOSIS — M62512 Muscle wasting and atrophy, not elsewhere classified, left shoulder: Secondary | ICD-10-CM | POA: Diagnosis not present

## 2024-04-22 DIAGNOSIS — Z9181 History of falling: Secondary | ICD-10-CM | POA: Diagnosis not present

## 2024-04-25 DIAGNOSIS — R2689 Other abnormalities of gait and mobility: Secondary | ICD-10-CM | POA: Diagnosis not present

## 2024-04-25 DIAGNOSIS — Z9181 History of falling: Secondary | ICD-10-CM | POA: Diagnosis not present

## 2024-04-25 DIAGNOSIS — M62552 Muscle wasting and atrophy, not elsewhere classified, left thigh: Secondary | ICD-10-CM | POA: Diagnosis not present

## 2024-04-25 DIAGNOSIS — M62551 Muscle wasting and atrophy, not elsewhere classified, right thigh: Secondary | ICD-10-CM | POA: Diagnosis not present

## 2024-04-25 DIAGNOSIS — R42 Dizziness and giddiness: Secondary | ICD-10-CM | POA: Diagnosis not present

## 2024-04-25 DIAGNOSIS — R2681 Unsteadiness on feet: Secondary | ICD-10-CM | POA: Diagnosis not present

## 2024-04-26 ENCOUNTER — Ambulatory Visit: Payer: Self-pay | Admitting: Family Medicine

## 2024-04-27 ENCOUNTER — Ambulatory Visit: Admitting: Physician Assistant

## 2024-04-27 DIAGNOSIS — R32 Unspecified urinary incontinence: Secondary | ICD-10-CM | POA: Diagnosis not present

## 2024-04-27 DIAGNOSIS — M62512 Muscle wasting and atrophy, not elsewhere classified, left shoulder: Secondary | ICD-10-CM | POA: Diagnosis not present

## 2024-04-27 DIAGNOSIS — M62511 Muscle wasting and atrophy, not elsewhere classified, right shoulder: Secondary | ICD-10-CM | POA: Diagnosis not present

## 2024-04-27 DIAGNOSIS — R488 Other symbolic dysfunctions: Secondary | ICD-10-CM | POA: Diagnosis not present

## 2024-04-29 ENCOUNTER — Ambulatory Visit: Admitting: Psychology

## 2024-04-29 DIAGNOSIS — F411 Generalized anxiety disorder: Secondary | ICD-10-CM | POA: Diagnosis not present

## 2024-04-29 DIAGNOSIS — R42 Dizziness and giddiness: Secondary | ICD-10-CM | POA: Diagnosis not present

## 2024-04-29 DIAGNOSIS — R488 Other symbolic dysfunctions: Secondary | ICD-10-CM | POA: Diagnosis not present

## 2024-04-29 DIAGNOSIS — R32 Unspecified urinary incontinence: Secondary | ICD-10-CM | POA: Diagnosis not present

## 2024-04-29 DIAGNOSIS — M62551 Muscle wasting and atrophy, not elsewhere classified, right thigh: Secondary | ICD-10-CM | POA: Diagnosis not present

## 2024-04-29 DIAGNOSIS — M62511 Muscle wasting and atrophy, not elsewhere classified, right shoulder: Secondary | ICD-10-CM | POA: Diagnosis not present

## 2024-04-29 DIAGNOSIS — F325 Major depressive disorder, single episode, in full remission: Secondary | ICD-10-CM

## 2024-04-29 DIAGNOSIS — M62552 Muscle wasting and atrophy, not elsewhere classified, left thigh: Secondary | ICD-10-CM | POA: Diagnosis not present

## 2024-04-29 DIAGNOSIS — R2689 Other abnormalities of gait and mobility: Secondary | ICD-10-CM | POA: Diagnosis not present

## 2024-04-29 DIAGNOSIS — M62512 Muscle wasting and atrophy, not elsewhere classified, left shoulder: Secondary | ICD-10-CM | POA: Diagnosis not present

## 2024-04-29 DIAGNOSIS — Z9181 History of falling: Secondary | ICD-10-CM | POA: Diagnosis not present

## 2024-04-29 DIAGNOSIS — R2681 Unsteadiness on feet: Secondary | ICD-10-CM | POA: Diagnosis not present

## 2024-04-29 NOTE — Progress Notes (Signed)
 [facility] Counselor/Therapist Progress Note  Patient ID: ALMENDRA LORIA, MRN: [id number]    Date: 04/29/24  Time Spent: 10:00 am - 10:54 am : 54 minutes (Phone session)  Treatment Type: Individual Therapy.  Reported Symptoms: Anxiety and depression  Mental Status Exam: Appearance:  NA     Behavior: Appropriate  Motor: Normal  Speech/Language:  Normal Rate  Affect: Appropriate  Mood: normal  Thought process: normal  Thought content:   WNL  Sensory/Perceptual disturbances:   WNL  Orientation: oriented to person, place, and time/date  Attention: Good  Concentration: Good  Memory: WNL  Fund of knowledge:  Good  Insight:   Good  Judgment:  Good  Impulse Control: Good   Risk Assessment: Danger to Self:  No Self-injurious Behavior: No Danger to Others: No Duty to Warn:no Physical Aggression / Violence:No  Access to Firearms a concern: No  Gang Involvement:No   Subjective:   Deanna C Bagg participated from home, via phone and consented to treatment. I discussed the limitations of evaluation and management by telemedicine and the availability of in person appointments. The patient expressed understanding and agreed to proceed. Therapist participated from home office.  Carmine reviewed the events of the past week.   Patient reported that she was feeling down but she didn't know why. Patient talked about missing her husband  Mallissa. Patient also reported that she felt anxious about going to activities in the community, and we discussed what she could do to motivate herself to go to activities starting this afternoon.   Interventions: Cognitive Behavioral Therapy  Diagnosis: Major depressive disorder in remission, unspecified whether recurrent (HCC) [F32.5]  GAD (generalized anxiety disorder) [F41.]  Psychiatric Treatment: Yes , via psychiatrist  Treatment Plan:   Client Abilities/Strengths [name] is expressive and engaging  Support System: Adult  children and friends  Client Treatment Preferences OPT  Client Statement of Needs Eufelia would like to share my worries so I'll feel better   Treatment Level Biweekly  Symptoms  [name] experiences symptoms of anxiety and depression  Goals:   Target Date: 09/07/24 Frequency: Biweekly  Progress: 15% Modality: individual    Therapist will provide referrals for additional resources as appropriate.  Therapist will provide psycho-education regarding Mitzy's diagnosis and corresponding treatment approaches and interventions. [name] will support the patient's ability to achieve the goals identified. will employ CBT, BA, Problem-solving, Solution Focused, Mindfulness,  coping skills, & other evidenced-based practices will be used to promote progress towards healthy functioning to help manage decrease symptoms associated with their diagnosis.   Reduce overall level, frequency, and intensity of the feelings of depression, anxiety and panic evidenced by decreased overall symptoms from 6 to 7 days/week to 0 to 1 days/week per client report for at least 3 consecutive months. Verbally express understanding of the relationship between feelings of depression and anxiety and their impact on thinking patterns and behaviors. Verbalize an understanding of the role that distorted thinking plays in creating fears, excessive worry, and ruminations.  (Davi participated in the creation of the treatment plan)     [name]

## 2024-05-02 DIAGNOSIS — M62552 Muscle wasting and atrophy, not elsewhere classified, left thigh: Secondary | ICD-10-CM | POA: Diagnosis not present

## 2024-05-02 DIAGNOSIS — R42 Dizziness and giddiness: Secondary | ICD-10-CM | POA: Diagnosis not present

## 2024-05-02 DIAGNOSIS — Z9181 History of falling: Secondary | ICD-10-CM | POA: Diagnosis not present

## 2024-05-02 DIAGNOSIS — R2681 Unsteadiness on feet: Secondary | ICD-10-CM | POA: Diagnosis not present

## 2024-05-02 DIAGNOSIS — R2689 Other abnormalities of gait and mobility: Secondary | ICD-10-CM | POA: Diagnosis not present

## 2024-05-02 DIAGNOSIS — M62551 Muscle wasting and atrophy, not elsewhere classified, right thigh: Secondary | ICD-10-CM | POA: Diagnosis not present

## 2024-05-05 DIAGNOSIS — R42 Dizziness and giddiness: Secondary | ICD-10-CM | POA: Diagnosis not present

## 2024-05-05 DIAGNOSIS — R2689 Other abnormalities of gait and mobility: Secondary | ICD-10-CM | POA: Diagnosis not present

## 2024-05-05 DIAGNOSIS — Z9181 History of falling: Secondary | ICD-10-CM | POA: Diagnosis not present

## 2024-05-05 DIAGNOSIS — M62551 Muscle wasting and atrophy, not elsewhere classified, right thigh: Secondary | ICD-10-CM | POA: Diagnosis not present

## 2024-05-05 DIAGNOSIS — M62552 Muscle wasting and atrophy, not elsewhere classified, left thigh: Secondary | ICD-10-CM | POA: Diagnosis not present

## 2024-05-05 DIAGNOSIS — R2681 Unsteadiness on feet: Secondary | ICD-10-CM | POA: Diagnosis not present

## 2024-05-09 DIAGNOSIS — R2681 Unsteadiness on feet: Secondary | ICD-10-CM | POA: Diagnosis not present

## 2024-05-09 DIAGNOSIS — R42 Dizziness and giddiness: Secondary | ICD-10-CM | POA: Diagnosis not present

## 2024-05-09 DIAGNOSIS — M62552 Muscle wasting and atrophy, not elsewhere classified, left thigh: Secondary | ICD-10-CM | POA: Diagnosis not present

## 2024-05-09 DIAGNOSIS — Z9181 History of falling: Secondary | ICD-10-CM | POA: Diagnosis not present

## 2024-05-09 DIAGNOSIS — M62551 Muscle wasting and atrophy, not elsewhere classified, right thigh: Secondary | ICD-10-CM | POA: Diagnosis not present

## 2024-05-09 DIAGNOSIS — R2689 Other abnormalities of gait and mobility: Secondary | ICD-10-CM | POA: Diagnosis not present

## 2024-05-10 DIAGNOSIS — M62552 Muscle wasting and atrophy, not elsewhere classified, left thigh: Secondary | ICD-10-CM | POA: Diagnosis not present

## 2024-05-10 DIAGNOSIS — M62551 Muscle wasting and atrophy, not elsewhere classified, right thigh: Secondary | ICD-10-CM | POA: Diagnosis not present

## 2024-05-10 DIAGNOSIS — R42 Dizziness and giddiness: Secondary | ICD-10-CM | POA: Diagnosis not present

## 2024-05-10 DIAGNOSIS — R2681 Unsteadiness on feet: Secondary | ICD-10-CM | POA: Diagnosis not present

## 2024-05-10 DIAGNOSIS — Z9181 History of falling: Secondary | ICD-10-CM | POA: Diagnosis not present

## 2024-05-10 DIAGNOSIS — R2689 Other abnormalities of gait and mobility: Secondary | ICD-10-CM | POA: Diagnosis not present

## 2024-05-12 ENCOUNTER — Ambulatory Visit: Admitting: Physician Assistant

## 2024-05-13 ENCOUNTER — Ambulatory Visit: Admitting: Psychology

## 2024-05-13 DIAGNOSIS — F411 Generalized anxiety disorder: Secondary | ICD-10-CM | POA: Diagnosis not present

## 2024-05-13 DIAGNOSIS — F330 Major depressive disorder, recurrent, mild: Secondary | ICD-10-CM
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
## 2024-05-13 NOTE — Progress Notes (Signed)
 [facility] Counselor/Therapist Progress Note  Patient ID: [name], MRN: [id number]    Date: 05/13/24  Time Spent: 10:01 am - 10:54 am : 53 minutes    Treatment Type: Individual Therapy.  Reported Symptoms: Anxiety and depression  Mental Status Exam: Appearance:  NA     Behavior: Appropriate  Motor: Normal  Speech/Language:  Normal Rate  Affect: Appropriate  Mood: normal  Thought process: normal  Thought content:   WNL  Sensory/Perceptual disturbances:   WNL  Orientation: oriented to person, place, and time/date  Attention: Good  Concentration: Good  Memory: WNL  Fund of knowledge:  Good  Insight:   Good  Judgment:  Good  Impulse Control: Good   Risk Assessment: Danger to Self:  No Self-injurious Behavior: No Danger to Others: No Duty to Warn:no Physical Aggression / Violence:No  Access to Firearms a concern: No  Gang Involvement:No   Subjective:   Randie C Yeldell participated from home, via video, and consented to treatment. I discussed the limitations of evaluation and management by telemedicine and the availability of in person appointments. The patient expressed understanding and agreed to proceed.  Therapist participated from home office.  Naraly reviewed the events of the past week.   Patient had just woken up when she answered the phone for this session. Patient reported that she was feeling lightheaded. Patient discussed feeling anxious about her family situation, and reported still feeling hopeless about the loss of her husband.   Interventions: Cognitive Behavioral Therapy  Diagnosis: Major depressive disorder, recurrent, mild [F33.0]  Generalized Anxiety Disorder, 41.1   Psychiatric Treatment: Yes , via psychiatrist  Treatment Plan:  Client Abilities/Strengths [name] is expressive and engaging  Support System: Adult children and friends  Client Treatment Preferences OPT  Client Statement of Needs [name] would like to  share my worries so I'll feel better.   Treatment Level Biweekly  Symptoms   [name] experiences symptoms of anxiety and depression. Status:maintained  Goals:   Target Date: 09/07/24 Frequency: Biweekly  Progress: 25% Modality: individual    Therapist will provide referrals for additional resources as appropriate.  Therapist will provide psycho-education regarding Stacia's diagnosis and corresponding treatment approaches and interventions. [name] will support the patient's ability to achieve the goals identified. will employ CBT, BA, Problem-solving, Solution Focused, Mindfulness,  coping skills, & other evidenced-based practices will be used to promote progress towards healthy functioning to help manage decrease symptoms associated with their diagnosis.   Reduce overall level, frequency, and intensity of the feelings of depression, anxiety and panic evidenced by decreased overall symptoms from 6 to 7 days/week to 0 to 1 days/week per client report for at least 3 consecutive months. Verbally express understanding of the relationship between feelings of depression and anxiety and their impact on thinking patterns and behaviors. Verbalize an understanding of the role that distorted thinking plays in creating fears, excessive worry, and ruminations.  (Syncere participated in the creation of the treatment plan)     [name]

## 2024-05-14 ENCOUNTER — Emergency Department (HOSPITAL_COMMUNITY)
Admission: EM | Admit: 2024-05-14 | Discharge: 2024-05-15 | Attending: Emergency Medicine | Admitting: Emergency Medicine

## 2024-05-14 ENCOUNTER — Emergency Department (HOSPITAL_COMMUNITY)

## 2024-05-14 ENCOUNTER — Other Ambulatory Visit: Payer: Self-pay

## 2024-05-14 ENCOUNTER — Encounter (HOSPITAL_COMMUNITY): Payer: Self-pay | Admitting: *Deleted

## 2024-05-14 DIAGNOSIS — R0789 Other chest pain: Secondary | ICD-10-CM | POA: Diagnosis not present

## 2024-05-14 DIAGNOSIS — I517 Cardiomegaly: Secondary | ICD-10-CM | POA: Diagnosis not present

## 2024-05-14 DIAGNOSIS — R079 Chest pain, unspecified: Secondary | ICD-10-CM | POA: Diagnosis not present

## 2024-05-14 DIAGNOSIS — I700 Atherosclerosis of aorta: Secondary | ICD-10-CM | POA: Diagnosis not present

## 2024-05-14 LAB — CBC
HCT: 42.3 % (ref 36.0–46.0)
Hemoglobin: 13.8 g/dL (ref 12.0–15.0)
MCH: 32.1 pg (ref 26.0–34.0)
MCHC: 32.6 g/dL (ref 30.0–36.0)
MCV: 98.4 fL (ref 80.0–100.0)
Platelets: 194 K/uL (ref 150–400)
RBC: 4.3 MIL/uL (ref 3.87–5.11)
RDW: 12.9 % (ref 11.5–15.5)
WBC: 6.7 K/uL (ref 4.0–10.5)
nRBC: 0 % (ref 0.0–0.2)

## 2024-05-14 LAB — BASIC METABOLIC PANEL WITH GFR
Anion gap: 11 (ref 5–15)
BUN: 13 mg/dL (ref 8–23)
CO2: 25 mmol/L (ref 22–32)
Calcium: 8.8 mg/dL — ABNORMAL LOW (ref 8.9–10.3)
Chloride: 105 mmol/L (ref 98–111)
Creatinine, Ser: 0.79 mg/dL (ref 0.44–1.00)
GFR, Estimated: >60 mL/min (ref >60)
Glucose, Bld: 85 mg/dL (ref 70–99)
Potassium: 3.8 mmol/L (ref 3.5–5.1)
Sodium: 141 mmol/L (ref 135–145)

## 2024-05-14 LAB — TROPONIN I (HIGH SENSITIVITY)
Troponin I (High Sensitivity): 5 ng/L (ref <18)
Troponin I (High Sensitivity): 5 ng/L (ref <18)

## 2024-05-14 NOTE — ED Triage Notes (Signed)
 Pt arrives by [facility] due to central chest pain with radiation to jaw which was 7/10 and decreased to 2/10 spontaneously.  Pt took 324mg  asa pta.  Pain is resolved on arrival to ED.  No sob, no nausea.

## 2024-05-14 NOTE — ED Provider Notes (Signed)
 [place] EMERGENCY DEPARTMENT AT [facility] Provider Note   CSN: [id number] Arrival date & time: 05/14/24  2038     Patient presents with: Chest Pain   [name] is a 88 y.o. female.    Chest Pain    Patient dents emergency room with complaints of chest pain.  Patient does have a history of arthritis hypercholesterolemia anxiety acid reflux hyperlipidemia but denies any history of heart disease.  Patient states she was at home this evening when she had sudden onset of discomfort that was aching in the center of her chest.  It moved into her jaw and ears.  Patient states she took an aspirin  and her symptoms resolved after couple of minutes.  She is not having any shortness of breath.  No nausea.  No leg swelling.  No fevers chills or coughing  Prior to Admission medications   Medication Sig Start Date End Date Taking? Authorizing Provider  cephALEXin  (KEFLEX ) 250 MG capsule Take 250 mg by mouth daily. 11/02/23   [provider]  COMBIGAN  0.2-0.5 % ophthalmic solution instill 1 drop into both eyes twice a day. 04/24/23   Katrinka Garnette KIDD, MD  divalproex  (DEPAKOTE ) 125 MG DR tablet TAKE ONE TABLET BY MOUTH AT BEDTIME 02/15/24   Katrinka Garnette KIDD, MD  donepezil  (ARICEPT ) 10 MG tablet Take 0.5 tablets (5 mg total) by mouth at bedtime. Take one tablet daily 09/03/23   Wertman, Sara E, PA-C  ezetimibe -simvastatin  (VYTORIN ) 10-20 MG tablet TAKE ONE TABLET BY MOUTH DAILY 02/15/24   Katrinka Garnette KIDD, MD  Multiple Vitamins-Minerals (MULTIVITAMIN ADULT) CHEW Chew 2 each by mouth daily. [employer] Made for Her    [provider]  nadolol  (CORGARD ) 20 MG tablet TAKE ONE TABLET BY MOUTH AT BEDTIME 10/02/23   Katrinka Garnette KIDD, MD  nitrofurantoin  (MACRODANTIN ) 50 MG capsule TAKE ONE CAPSULE ONCE DAILY Patient not taking: Reported on 01/29/2024 10/02/23   Katrinka Garnette KIDD, MD  omeprazole  (PRILOSEC) 20 MG capsule Take 20 mg by mouth daily. In the morning for acid reflux over  the counter    [provider]  traZODone  (DESYREL ) 100 MG tablet take 1/4 TO 1/2 tablet AT BEDTIME 08/10/23   Katrinka Garnette KIDD, MD  venlafaxine  XR (EFFEXOR -XR) 150 MG 24 hr capsule TAKE ONE CAPSULE BY MOUTH EVERY MORNING 10/02/23   Katrinka Garnette KIDD, MD    Allergies: Citrate of magnesia, Codeine, and Shingrix [zoster vac recomb adjuvanted]    Review of Systems  Cardiovascular:  Positive for chest pain.    Updated Vital Signs BP 114/65   Pulse 63   Temp 97.8 F (36.6 C) (Oral)   Resp 16   LMP 08/11/1974   SpO2 95%   Physical Exam Vitals and nursing note reviewed.  Constitutional:      General: She is not in acute distress.    Appearance: She is well-developed.  HENT:     Head: Normocephalic and atraumatic.     Right Ear: External ear normal.     Left Ear: External ear normal.  Eyes:     General: No scleral icterus.       Right eye: No discharge.        Left eye: No discharge.     Conjunctiva/sclera: Conjunctivae normal.  Neck:     Trachea: No tracheal deviation.  Cardiovascular:     Rate and Rhythm: Normal rate and regular rhythm.  Pulmonary:     Effort: Pulmonary effort is normal. No respiratory distress.  Breath sounds: Normal breath sounds. No stridor. No wheezing or rales.  Abdominal:     General: Bowel sounds are normal. There is no distension.     Palpations: Abdomen is soft.     Tenderness: There is no abdominal tenderness. There is no guarding or rebound.  Musculoskeletal:        General: No tenderness or deformity.     Cervical back: Neck supple.     Right lower leg: No tenderness. No edema.     Left lower leg: No tenderness. No edema.  Skin:    General: Skin is warm and dry.     Findings: No rash.  Neurological:     General: No focal deficit present.     Mental Status: She is alert.     Cranial Nerves: No cranial nerve deficit, dysarthria or facial asymmetry.     Sensory: No sensory deficit.     Motor: No abnormal muscle tone or seizure  activity.     Coordination: Coordination normal.  Psychiatric:        Mood and Affect: Mood normal.     (all labs ordered are listed, but only abnormal results are displayed) Labs Reviewed  BASIC METABOLIC PANEL WITH GFR - Abnormal; Notable for the following components:      Result Value   Calcium 8.8 (*)    All other components within normal limits  CBC  TROPONIN I (HIGH SENSITIVITY)  TROPONIN I (HIGH SENSITIVITY)    EKG: EKG Interpretation Date/Time:  Saturday May 14 2024 20:59:44 EDT Ventricular Rate:  61 PR Interval:  197 QRS Duration:  124 QT Interval:  452 QTC Calculation: 456 R Axis:   -58  Text Interpretation: Sinus rhythm Left bundle branch block Confirmed by Randol Simmonds [phone]) on 05/14/2024 9:05:12 PM  Radiology: [facility] Chest 2 View Result Date: 05/14/2024 CLINICAL DATA:  Chest pain. EXAM: CHEST - 2 VIEW COMPARISON:  Portable chest 03/10/2022 FINDINGS: There is mild cardiomegaly without evidence of CHF. The sulci are sharp. Right hemidiaphragm with eventration and moderate chronic elevation. The lungs are clear. The mediastinum is normally outlined. Patchy calcification thoracic aorta. Osteopenia. There is chronic wedging and kyphoplasty cement in the L2 vertebral body. Thoracic spondylosis. IMPRESSION: 1. No evidence of acute chest disease.  Stable exam. 2. Mild cardiomegaly. 3. Aortic atherosclerosis. Electronically Signed   By: Francis Quam M.D.   On: 05/14/2024 21:38     Procedures   Medications Ordered in the ED - No data to display  Clinical Course as of 05/14/24 2341  Sat May 14, 2024  2157 Troponin I (High Sensitivity) Initial troponin normal.  Metabolic panel normal.  CBC normal. [JK]  2157 X-ray without acute findings. [JK]    Clinical Course User Index [JK] Randol Simmonds, MD             HEART Score: 4                    Medical Decision Making Problems Addressed: Chest pain, unspecified type: acute illness or injury that poses a threat to life  or bodily functions  Amount and/or Complexity of Data Reviewed Labs: ordered. Decision-making details documented in ED Course. Radiology: ordered and independent interpretation performed.   Patient presented to ED with complaints of chest pain.  Patient denies any history of heart disease however her symptoms were concerning for the possibility of ACS.  Esophageal reflux or spasm also a consideration.  Initial laboratory test reassuring.  Initial troponin normal.  Patient remains pain-free in the ED.  Will plan on delta troponin.  If negative could consider close outpatient follow-up with cardiology clinic.  Care turned over to Dr. Wanita at shift change     Final diagnoses:  Chest pain, unspecified type    ED Discharge Orders     None          Randol Simmonds, MD 05/14/24 2341

## 2024-05-14 NOTE — ED Notes (Signed)
 Patient transported to X-ray

## 2024-05-16 ENCOUNTER — Ambulatory Visit: Admitting: Physician Assistant

## 2024-05-16 VITALS — Ht 60.0 in

## 2024-05-16 DIAGNOSIS — G3184 Mild cognitive impairment, so stated: Secondary | ICD-10-CM

## 2024-05-16 DIAGNOSIS — R42 Dizziness and giddiness: Secondary | ICD-10-CM | POA: Diagnosis not present

## 2024-05-16 DIAGNOSIS — Z9181 History of falling: Secondary | ICD-10-CM | POA: Diagnosis not present

## 2024-05-16 DIAGNOSIS — M62551 Muscle wasting and atrophy, not elsewhere classified, right thigh: Secondary | ICD-10-CM | POA: Diagnosis not present

## 2024-05-16 DIAGNOSIS — R2681 Unsteadiness on feet: Secondary | ICD-10-CM | POA: Diagnosis not present

## 2024-05-16 DIAGNOSIS — M62552 Muscle wasting and atrophy, not elsewhere classified, left thigh: Secondary | ICD-10-CM | POA: Diagnosis not present

## 2024-05-16 DIAGNOSIS — R2689 Other abnormalities of gait and mobility: Secondary | ICD-10-CM | POA: Diagnosis not present

## 2024-05-16 MED ORDER — MEMANTINE HCL 5 MG PO TABS: ORAL_TABLET | ORAL | 3 refills | Status: AC

## 2024-05-16 MED ORDER — DONEPEZIL HCL 10 MG PO TABS: 10.0000 mg | ORAL_TABLET | Freq: Every evening | ORAL | 11 refills | Status: AC

## 2024-05-16 NOTE — Patient Instructions (Signed)
 It was a pleasure to see you today at our office.   Recommendations:   Continue Donepezil  to 10 mg daily.  Add Start Memantine 5mg  tablets.  Take 1 tablet at bedtime for 2 weeks, then 1 tablet twice daily.    Replenish B12 daily  Recommend hearing evaluation to improve comprehension  Follow up in 6 months      RECOMMENDATIONS FOR ALL PATIENTS WITH MEMORY PROBLEMS: 1. Continue to exercise (Recommend 30 minutes of walking everyday, or 3 hours every week) 2. Increase social interactions - continue going to [place] and enjoy social gatherings with friends and family 3. Eat healthy, avoid fried foods and eat more fruits and vegetables 4. Maintain adequate blood pressure, blood sugar, and blood cholesterol level. Reducing the risk of stroke and cardiovascular disease also helps promoting better memory. 5. Avoid stressful situations. Live a simple life and avoid aggravations. Organize your time and prepare for the next day in anticipation. 6. Sleep well, avoid any interruptions of sleep and avoid any distractions in the bedroom that may interfere with adequate sleep quality 7. Avoid sugar, avoid sweets as there is a strong link between excessive sugar intake, diabetes, and cognitive impairment We discussed the Mediterranean diet, which has been shown to help patients reduce the risk of progressive memory disorders and reduces cardiovascular risk. This includes eating fish, eat fruits and green leafy vegetables, nuts like almonds and hazelnuts, walnuts, and also use olive oil. Avoid fast foods and fried foods as much as possible. Avoid sweets and sugar as sugar use has been linked to worsening of memory function.  There is always a concern of gradual progression of memory problems. If this is the case, then we may need to adjust level of care according to patient needs. Support, both to the patient and caregiver, should then be put into place.      You have been referred for a neuropsychological  evaluation (i.e., evaluation of memory and thinking abilities). Please bring someone with you to this appointment if possible, as it is helpful for the doctor to hear from both you and another adult who knows you well. Please bring eyeglasses and hearing aids if you wear them.    The evaluation will take approximately 3 hours and has two parts:   The first part is a clinical interview with the neuropsychologist (Dr. Richie or Dr. Jackquline). During the interview, the neuropsychologist will speak with you and the individual you brought to the appointment.    The second part of the evaluation is testing with the doctor's technician Neal or Luke). During the testing, the technician will ask you to remember different types of material, solve problems, and answer some questionnaires. Your family member will not be present for this portion of the evaluation.   Please note: We must reserve several hours of the neuropsychologist's time and the psychometrician's time for your evaluation appointment. As such, there is a No-Show fee of $100. If you are unable to attend any of your appointments, please contact our office as soon as possible to reschedule.    FALL PRECAUTIONS: Be cautious when walking. Scan the area for obstacles that may increase the risk of trips and falls. When getting up in the mornings, sit up at the edge of the bed for a few minutes before getting out of bed. Consider elevating the bed at the head end to avoid drop of blood pressure when getting up. Walk always in a well-lit room (use night lights in the walls).  Avoid area rugs or power cords from appliances in the middle of the walkways. Use a walker or a cane if necessary and consider physical therapy for balance exercise. Get your eyesight checked regularly.  FINANCIAL OVERSIGHT: Supervision, especially oversight when making financial decisions or transactions is also recommended.  HOME SAFETY: Consider the safety of the kitchen when  operating appliances like stoves, microwave oven, and blender. Consider having supervision and share cooking responsibilities until no longer able to participate in those. Accidents with firearms and other hazards in the house should be identified and addressed as well.   ABILITY TO BE LEFT ALONE: If patient is unable to contact 911 operator, consider using LifeLine, or when the need is there, arrange for someone to stay with patients. Smoking is a fire hazard, consider supervision or cessation. Risk of wandering should be assessed by caregiver and if detected at any point, supervision and safe proof recommendations should be instituted.  MEDICATION SUPERVISION: Inability to self-administer medication needs to be constantly addressed. Implement a mechanism to ensure safe administration of the medications.      Mediterranean Diet A Mediterranean diet refers to food and lifestyle choices that are based on the traditions of countries located on the [employer]. This way of eating has been shown to help prevent certain conditions and improve outcomes for people who have chronic diseases, like kidney disease and heart disease. What are tips for following this plan? Lifestyle  Cook and eat meals together with your family, when possible. Drink enough fluid to keep your urine clear or pale yellow. Be physically active every day. This includes: Aerobic exercise like running or swimming. Leisure activities like gardening, walking, or housework. Get 7-8 hours of sleep each night. If recommended by your health care provider, drink red wine in moderation. This means 1 glass a day for nonpregnant women and 2 glasses a day for men. A glass of wine equals 5 oz (150 mL). Reading food labels  Check the serving size of packaged foods. For foods such as rice and pasta, the serving size refers to the amount of cooked product, not dry. Check the total fat in packaged foods. Avoid foods that have saturated fat  or trans fats. Check the ingredients list for added sugars, such as corn syrup. Shopping  At the grocery store, buy most of your food from the areas near the walls of the store. This includes: Fresh fruits and vegetables (produce). Grains, beans, nuts, and seeds. Some of these may be available in unpackaged forms or large amounts (in bulk). Fresh seafood. Poultry and eggs. Low-fat dairy products. Buy whole ingredients instead of prepackaged foods. Buy fresh fruits and vegetables in-season from local farmers markets. Buy frozen fruits and vegetables in resealable bags. If you do not have access to quality fresh seafood, buy precooked frozen shrimp or canned fish, such as tuna, salmon, or sardines. Buy small amounts of raw or cooked vegetables, salads, or olives from the deli or salad bar at your store. Stock your pantry so you always have certain foods on hand, such as olive oil, canned tuna, canned tomatoes, rice, pasta, and beans. Cooking  Cook foods with extra-virgin olive oil instead of using butter or other vegetable oils. Have meat as a side dish, and have vegetables or grains as your main dish. This means having meat in small portions or adding small amounts of meat to foods like pasta or stew. Use beans or vegetables instead of meat in common dishes like chili or lasagna.  Experiment with different cooking methods. Try roasting or broiling vegetables instead of steaming or sauteing them. Add frozen vegetables to soups, stews, pasta, or rice. Add nuts or seeds for added healthy fat at each meal. You can add these to yogurt, salads, or vegetable dishes. Marinate fish or vegetables using olive oil, lemon juice, garlic, and fresh herbs. Meal planning  Plan to eat 1 vegetarian meal one day each week. Try to work up to 2 vegetarian meals, if possible. Eat seafood 2 or more times a week. Have healthy snacks readily available, such as: Vegetable sticks with hummus. Greek yogurt. Fruit and  nut trail mix. Eat balanced meals throughout the week. This includes: Fruit: 2-3 servings a day Vegetables: 4-5 servings a day Low-fat dairy: 2 servings a day Fish, poultry, or lean meat: 1 serving a day Beans and legumes: 2 or more servings a week Nuts and seeds: 1-2 servings a day Whole grains: 6-8 servings a day Extra-virgin olive oil: 3-4 servings a day Limit red meat and sweets to only a few servings a month What are my food choices? Mediterranean diet Recommended Grains: Whole-grain pasta. Brown rice. Bulgar wheat. Polenta. Couscous. Whole-wheat bread. [place] Made[place]. Vegetables: Artichokes. Beets. Broccoli. Cabbage. Carrots. Eggplant. Green beans. Chard. Kale. Spinach. Onions. Leeks. Peas. Squash. Tomatoes. Peppers. Radishes. Fruits: Apples. Apricots. Avocado. Berries. Bananas. Cherries. Dates. Figs. Grapes. Lemons. Melon. Oranges. Peaches. Plums. Pomegranate. Meats and other protein foods: Beans. Almonds. Sunflower seeds. Pine nuts. Peanuts. Cod. Salmon. Scallops. Shrimp. Tuna. Tilapia. Clams. Oysters. Eggs. Dairy: Low-fat milk. Cheese. Greek yogurt. Beverages: Water. Red wine. Herbal tea. Fats and oils: Extra virgin olive oil. Avocado oil. Grape seed oil. Sweets and desserts: [place] yogurt with honey. Baked apples. Poached pears. Trail mix. Seasoning and other foods: Basil. Cilantro. Coriander. Cumin. Mint. Parsley. Sage. Rosemary. Tarragon. Garlic. Oregano. Thyme. Pepper. Balsalmic vinegar. Tahini. Hummus. Tomato sauce. Olives. Mushrooms. Limit these Grains: Prepackaged pasta or rice dishes. Prepackaged cereal with added sugar. Vegetables: Deep fried potatoes (french fries). Fruits: Fruit canned in syrup. Meats and other protein foods: Beef. Pork. Lamb. Poultry with skin. Hot dogs. [place]. Dairy: Ice cream. Sour cream. Whole milk. Beverages: Juice. Sugar-sweetened soft drinks. Beer. Liquor and spirits. Fats and oils: Butter. Canola oil. Vegetable oil. Beef fat (tallow).  Lard. Sweets and desserts: Cookies. Cakes. Pies. Candy. Seasoning and other foods: Mayonnaise. Premade sauces and marinades. The items listed may not be a complete list. Talk with your dietitian about what dietary choices are right for you. Summary The Mediterranean diet includes both food and lifestyle choices. Eat a variety of fresh fruits and vegetables, beans, nuts, seeds, and whole grains. Limit the amount of red meat and sweets that you eat. Talk with your health care provider about whether it is safe for you to drink red wine in moderation. This means 1 glass a day for nonpregnant women and 2 glasses a day for men. A glass of wine equals 5 oz (150 mL). This information is not intended to replace advice given to you by your health care provider. Make sure you discuss any questions you have with your health care provider. Document Released: 03/20/2016 Document Revised: 04/22/2016 Document Reviewed: 03/20/2016 Elsevier Interactive Patient Education  2017 [employer].

## 2024-05-18 DIAGNOSIS — H52203 Unspecified astigmatism, bilateral: Secondary | ICD-10-CM | POA: Diagnosis not present

## 2024-05-18 DIAGNOSIS — M62551 Muscle wasting and atrophy, not elsewhere classified, right thigh: Secondary | ICD-10-CM | POA: Diagnosis not present

## 2024-05-18 DIAGNOSIS — R2689 Other abnormalities of gait and mobility: Secondary | ICD-10-CM | POA: Diagnosis not present

## 2024-05-18 DIAGNOSIS — R2681 Unsteadiness on feet: Secondary | ICD-10-CM | POA: Diagnosis not present

## 2024-05-18 DIAGNOSIS — M62552 Muscle wasting and atrophy, not elsewhere classified, left thigh: Secondary | ICD-10-CM | POA: Diagnosis not present

## 2024-05-18 DIAGNOSIS — H401123 Primary open-angle glaucoma, left eye, severe stage: Secondary | ICD-10-CM | POA: Diagnosis not present

## 2024-05-18 DIAGNOSIS — H401112 Primary open-angle glaucoma, right eye, moderate stage: Secondary | ICD-10-CM | POA: Diagnosis not present

## 2024-05-18 DIAGNOSIS — R42 Dizziness and giddiness: Secondary | ICD-10-CM | POA: Diagnosis not present

## 2024-05-18 DIAGNOSIS — Z9181 History of falling: Secondary | ICD-10-CM | POA: Diagnosis not present

## 2024-05-24 DIAGNOSIS — Z9181 History of falling: Secondary | ICD-10-CM | POA: Diagnosis not present

## 2024-05-24 DIAGNOSIS — R42 Dizziness and giddiness: Secondary | ICD-10-CM | POA: Diagnosis not present

## 2024-05-24 DIAGNOSIS — M62551 Muscle wasting and atrophy, not elsewhere classified, right thigh: Secondary | ICD-10-CM | POA: Diagnosis not present

## 2024-05-24 DIAGNOSIS — R2681 Unsteadiness on feet: Secondary | ICD-10-CM | POA: Diagnosis not present

## 2024-05-24 DIAGNOSIS — M62552 Muscle wasting and atrophy, not elsewhere classified, left thigh: Secondary | ICD-10-CM | POA: Diagnosis not present

## 2024-05-24 DIAGNOSIS — R2689 Other abnormalities of gait and mobility: Secondary | ICD-10-CM | POA: Diagnosis not present

## 2024-05-25 DIAGNOSIS — Z9181 History of falling: Secondary | ICD-10-CM | POA: Diagnosis not present

## 2024-05-25 DIAGNOSIS — R42 Dizziness and giddiness: Secondary | ICD-10-CM | POA: Diagnosis not present

## 2024-05-25 DIAGNOSIS — R2689 Other abnormalities of gait and mobility: Secondary | ICD-10-CM | POA: Diagnosis not present

## 2024-05-25 DIAGNOSIS — M62552 Muscle wasting and atrophy, not elsewhere classified, left thigh: Secondary | ICD-10-CM | POA: Diagnosis not present

## 2024-05-25 DIAGNOSIS — M62551 Muscle wasting and atrophy, not elsewhere classified, right thigh: Secondary | ICD-10-CM | POA: Diagnosis not present

## 2024-05-25 DIAGNOSIS — R2681 Unsteadiness on feet: Secondary | ICD-10-CM | POA: Diagnosis not present

## 2024-05-26 ENCOUNTER — Ambulatory Visit (INDEPENDENT_AMBULATORY_CARE_PROVIDER_SITE_OTHER): Admitting: Psychology

## 2024-05-26 DIAGNOSIS — F411 Generalized anxiety disorder: Secondary | ICD-10-CM

## 2024-05-26 DIAGNOSIS — F330 Major depressive disorder, recurrent, mild: Secondary | ICD-10-CM | POA: Diagnosis not present

## 2024-05-26 NOTE — Progress Notes (Signed)
 [facility] Counselor/Therapist Progress Note  Patient ID: NESIAH JUMP, MRN: [id number]    Date: 05/26/24  Time Spent: 1:01 pm - 1:54 pm : 53 minutes via PHONE (Patient had technology/phone issues so we spoke on this writer's cell phone until patient was able to get help with her technology/phone)  Treatment Type: Individual Therapy.  Reported Symptoms: Anxiety and depression   Mental Status Exam: Appearance:  NA     Behavior: Suspicious  Motor: Shuffling Gait  Speech/Language:  Clear and Coherent  Affect: Depressed  Mood: anxious and sad  Thought process: tangential  Thought content:   Patient reported that others think she hallucinates  Sensory/Perceptual disturbances: Patient reported that others think she has illusions  Orientation: oriented to person, place, and time/date  Attention: Good  Concentration: Good  Memory: Mild cognitive impairment  Fund of knowledge:  Good  Insight:   Good  Judgment:  Fair  Impulse Control: Good   Risk Assessment: Danger to Self:  No Self-injurious Behavior: No Danger to Others: No Duty to Warn:no Physical Aggression / Violence:No  Access to Firearms a concern: No  Gang Involvement:No   Subjective:   Cienna C Strothman participated from home, via video, and consented to treatment. I discussed the limitations of evaluation and management by telemedicine and the availability of in person appointments. The patient expressed understanding and agreed to proceed.  Therapist participated from home office.  Julieth reviewed the events of the past week.   Patient talked about feeling excited that her son Skip is coming to visit her tomorrow. Patient reported that she hasn't seen Skip in three weeks. Patient reports feeling both anxious and down about her granddaughter not having any contact with her.    Interventions: Cognitive Behavioral Therapy  Diagnosis:  Major depressive disorder, recurrent, mild [F33.0]  Generalized  Anxiety Disorder, 41.1   Psychiatric Treatment: Yes , via psychiatrist  Treatment Plan:  Client Abilities/Strengths [name] is expressive and engaging.  Support System: Adult children and friends  Client Treatment Preferences OPT  Client Statement of Needs Samanthamarie would like to share my worries so I'll feel better.   Treatment Level Biweekly  Symptoms  [name] experiences symptoms of anxiety and depression. Status : maintained)  Goals:   Target Date: 09/07/24 Frequency: Biweekly  Progress: 25% Modality: individual    Therapist will provide referrals for additional resources as appropriate.  Therapist will provide psycho-education regarding Kyndel's diagnosis and corresponding treatment approaches and interventions. [name] will support the patient's ability to achieve the goals identified. will employ CBT, BA, Problem-solving, Solution Focused, Mindfulness,  coping skills, & other evidenced-based practices will be used to promote progress towards healthy functioning to help manage decrease symptoms associated with their diagnosis.   Reduce overall level, frequency, and intensity of the feelings of depression, anxiety and panic evidenced by decreased overall symptoms from 6 to 7 days/week to 0 to 1 days/week per client report for at least 3 consecutive months. Verbally express understanding of the relationship between feelings of depression and anxiety and their impact on thinking patterns and behaviors. Verbalize an understanding of the role that distorted thinking plays in creating fears, excessive worry, and ruminations.  ([name] participated in the creation of the treatment plan)     [name]

## 2024-05-27 ENCOUNTER — Ambulatory Visit: Admitting: Psychology

## 2024-05-31 DIAGNOSIS — R2681 Unsteadiness on feet: Secondary | ICD-10-CM | POA: Diagnosis not present

## 2024-05-31 DIAGNOSIS — I10 Essential (primary) hypertension: Secondary | ICD-10-CM | POA: Diagnosis not present

## 2024-05-31 DIAGNOSIS — H409 Unspecified glaucoma: Secondary | ICD-10-CM | POA: Diagnosis not present

## 2024-05-31 DIAGNOSIS — K219 Gastro-esophageal reflux disease without esophagitis: Secondary | ICD-10-CM | POA: Diagnosis not present

## 2024-05-31 DIAGNOSIS — E785 Hyperlipidemia, unspecified: Secondary | ICD-10-CM | POA: Diagnosis not present

## 2024-05-31 DIAGNOSIS — I700 Atherosclerosis of aorta: Secondary | ICD-10-CM | POA: Diagnosis not present

## 2024-05-31 DIAGNOSIS — G4700 Insomnia, unspecified: Secondary | ICD-10-CM | POA: Diagnosis not present

## 2024-05-31 DIAGNOSIS — R42 Dizziness and giddiness: Secondary | ICD-10-CM | POA: Diagnosis not present

## 2024-05-31 DIAGNOSIS — R2689 Other abnormalities of gait and mobility: Secondary | ICD-10-CM | POA: Diagnosis not present

## 2024-05-31 DIAGNOSIS — Z9181 History of falling: Secondary | ICD-10-CM | POA: Diagnosis not present

## 2024-05-31 DIAGNOSIS — M62552 Muscle wasting and atrophy, not elsewhere classified, left thigh: Secondary | ICD-10-CM | POA: Diagnosis not present

## 2024-05-31 DIAGNOSIS — G309 Alzheimer's disease, unspecified: Secondary | ICD-10-CM | POA: Diagnosis not present

## 2024-05-31 DIAGNOSIS — M62551 Muscle wasting and atrophy, not elsewhere classified, right thigh: Secondary | ICD-10-CM | POA: Diagnosis not present

## 2024-05-31 DIAGNOSIS — G319 Degenerative disease of nervous system, unspecified: Secondary | ICD-10-CM | POA: Diagnosis not present

## 2024-05-31 DIAGNOSIS — F329 Major depressive disorder, single episode, unspecified: Secondary | ICD-10-CM | POA: Diagnosis not present

## 2024-06-01 ENCOUNTER — Ambulatory Visit: Payer: Self-pay

## 2024-06-01 ENCOUNTER — Telehealth: Payer: Self-pay | Admitting: Family Medicine

## 2024-06-01 NOTE — Telephone Encounter (Signed)
 Unable to leave voicemail for patient on requested call back number.

## 2024-06-01 NOTE — Telephone Encounter (Signed)
 Patient spoke with NT at 11:45 regarding fatigue. Documented that patient refused appointment and was requesting call back from PCP office.   Copied from CRM [id number]. Topic: Clinical - Medication Question >> Jun 01, 2024 11:49 AM [name] wrote: Reason for CRM: Patient is requesting if she can get a prescription for vitamin D due to feeling weakness.

## 2024-06-01 NOTE — Telephone Encounter (Signed)
 FYI Only or Action Required?: Action required by provider: clinical question for provider and update on patient condition.  Patient was last seen in primary care on 01/29/2024 by Katrinka Garnette KIDD, MD.  Called Nurse Triage reporting Fatigue.  Symptoms began several days ago.  Interventions attempted: Nothing.  Symptoms are: unchanged.  Triage Disposition: See Physician Within 24 Hours  Patient/caregiver understands and will follow disposition?: No, refuses disposition  Copied from CRM #[id number]. Topic: Clinical - Red Word Triage >> Jun 01, 2024 11:45 AM Rea BROCKS wrote: Red Word that prompted transfer to Nurse Triage: severe fatigue Reason for Disposition  [1] MODERATE weakness (e.g., interferes with work, school, normal activities) AND [2] persists > 3 days  Answer Assessment - Initial Assessment Questions Patient is resident at [facility] Patient declined to come in for appointment, but would like a phone call [phone]  1. DESCRIPTION: Describe how you are feeling.     Weak and fatigue. Legs feel weak when she wakes up and she does not feel like doing anything 2. SEVERITY: How bad is it?  Can you stand and walk?     Patient still able to walk  3. ONSET: When did these symptoms begin? (e.g., hours, days, weeks, months)     Two days 4. CAUSE: What do you think is causing the weakness or fatigue? (e.g., not drinking enough fluids, medical problem, trouble sleeping)     Also has a runny nose for about a month 5. NEW MEDICINES:  Have you started on any new medicines recently? (e.g., opioid pain medicines, benzodiazepines, muscle relaxants, antidepressants, antihistamines, neuroleptics, beta blockers)     denies 6. OTHER SYMPTOMS: Do you have any other symptoms? (e.g., chest pain, fever, cough, SOB, vomiting, diarrhea, bleeding, other areas of pain)     Denies any other symptoms 7. PREGNANCY: Is there any chance you are pregnant? When was your last menstrual  period?     N/a  Protocols used: Weakness (Generalized) and Fatigue-A-AH

## 2024-06-01 NOTE — Telephone Encounter (Signed)
 Called patient and she stated she wanted us  to get her otc vitamin b12 delivered to her because [facility] will not take her to the pharmacy. I told her I would call and see if [facility] offered delivery service and she stated she did not need me to call and she would call. Patient advised to call us  back if she needs anything and she verbalized understanding. No further questions at this time.    Copied from CRM #[id number]. Topic: General - Other >> Jun 01, 2024  2:50 PM Mercedes MATSU wrote: Reason for CRM: Patient called in requesting to speak with Dr. Katrinka. She would not disclose the details. Is requesting call back from him specifically, she can be reached at [phone].

## 2024-06-02 DIAGNOSIS — R2681 Unsteadiness on feet: Secondary | ICD-10-CM | POA: Diagnosis not present

## 2024-06-02 NOTE — Telephone Encounter (Signed)
 Happy to help and send to them if needed- please let me know

## 2024-06-08 DIAGNOSIS — M62551 Muscle wasting and atrophy, not elsewhere classified, right thigh: Secondary | ICD-10-CM | POA: Diagnosis not present

## 2024-06-08 DIAGNOSIS — R2681 Unsteadiness on feet: Secondary | ICD-10-CM | POA: Diagnosis not present

## 2024-06-08 DIAGNOSIS — Z9181 History of falling: Secondary | ICD-10-CM | POA: Diagnosis not present

## 2024-06-08 DIAGNOSIS — R2689 Other abnormalities of gait and mobility: Secondary | ICD-10-CM | POA: Diagnosis not present

## 2024-06-08 DIAGNOSIS — M62552 Muscle wasting and atrophy, not elsewhere classified, left thigh: Secondary | ICD-10-CM | POA: Diagnosis not present

## 2024-06-08 DIAGNOSIS — R42 Dizziness and giddiness: Secondary | ICD-10-CM | POA: Diagnosis not present

## 2024-06-09 DIAGNOSIS — R2681 Unsteadiness on feet: Secondary | ICD-10-CM | POA: Diagnosis not present

## 2024-06-09 DIAGNOSIS — R2689 Other abnormalities of gait and mobility: Secondary | ICD-10-CM | POA: Diagnosis not present

## 2024-06-09 DIAGNOSIS — M62551 Muscle wasting and atrophy, not elsewhere classified, right thigh: Secondary | ICD-10-CM | POA: Diagnosis not present

## 2024-06-09 DIAGNOSIS — R42 Dizziness and giddiness: Secondary | ICD-10-CM | POA: Diagnosis not present

## 2024-06-09 DIAGNOSIS — M62552 Muscle wasting and atrophy, not elsewhere classified, left thigh: Secondary | ICD-10-CM | POA: Diagnosis not present

## 2024-06-09 DIAGNOSIS — Z9181 History of falling: Secondary | ICD-10-CM | POA: Diagnosis not present

## 2024-06-10 ENCOUNTER — Ambulatory Visit (INDEPENDENT_AMBULATORY_CARE_PROVIDER_SITE_OTHER): Admitting: Psychology

## 2024-06-10 DIAGNOSIS — F330 Major depressive disorder, recurrent, mild: Secondary | ICD-10-CM

## 2024-06-10 DIAGNOSIS — F411 Generalized anxiety disorder: Secondary | ICD-10-CM | POA: Diagnosis not present

## 2024-06-10 NOTE — Progress Notes (Signed)
 [name] Counselor/Therapist Progress Note  Patient ID: [name], MRN: [id number]    Date: 06/10/24  Time Spent: 10:00 am - 10:54 am : 54 minutes     Treatment Type: Individual Therapy.  Reported Symptoms: Anxiety and depression  Mental Status Exam: Appearance:  NA     Behavior: Appropriate  Motor: Restlestness  Speech/Language:  Clear and Coherent  Affect: Depressed  Mood: anxious  Thought process: circumstantial  Thought content:   WNL  Sensory/Perceptual disturbances:   WNL  Orientation: oriented to person, place, and time/date  Attention: Good  Concentration: Good  Memory: Mild cognitive impairment  Fund of knowledge:  Good  Insight:   Fair  Judgment:  Good  Impulse Control: Fair   Risk Assessment: Danger to Self:  No Self-injurious Behavior: No Danger to Others: No Duty to Warn:no Physical Aggression / Violence:No  Access to Firearms a concern: No  Gang Involvement:No   Subjective:   [name] participated from home, via phone, and consented to treatment. I discussed the limitations of evaluation and management by telemedicine and the availability of in person appointments. The patient expressed understanding and agreed to proceed.  Therapist participated from office.  Khylei reviewed the events of the past week.   Patient talked about how nervous she was about her living situation and watching people out her bedroom window. Patient and her son [name] made plans for patinet to move to the first floor to be safer. Patient felt down and sad about a new woman, Reena, who moved into the same senior living place recently. Patient felt Reena wasn't being treated well by the other residents, so she tooks out  for her with the other residents.   Interventions: Cognitive Behavioral Therapy  Diagnosis: Major depressive disorder, recurrent, mild [F33.0]  Generalized Anxiety Disorder, 41.1   Psychiatric Treatment: Yes , via  psychiatrist  Treatment Plan:  Client Abilities/Strengths [name] is expressive and engaging.  Support System: Adult son, daughter in law  Client Treatment Preferences OPT  Client Statement of Needs [name] would like to share my worries so I'll feel better.  Treatment Level Biweekly  Symptoms  [name] experiences symptoms of anxiety and depression (Status : maintained).   Goals:   Target Date: 09/07/24 Frequency: Biweekly  Progress: 35% Modality: individual    Therapist will provide referrals for additional resources as appropriate.  Therapist will provide psycho-education regarding Dotsie's diagnosis and corresponding treatment approaches and interventions. [name] will support the patient's ability to achieve the goals identified. will employ CBT, BA, Problem-solving, Solution Focused, Mindfulness,  coping skills, & other evidenced-based practices will be used to promote progress towards healthy functioning to help manage decrease symptoms associated with their diagnosis.   Reduce overall level, frequency, and intensity of the feelings of depression, anxiety and panic evidenced by decreased overall symptoms from 6 to 7 days/week to 0 to 1 days/week per client report for at least 3 consecutive months. Verbally express understanding of the relationship between feelings of depression and anxiety and their impact on thinking patterns and behaviors. Verbalize an understanding of the role that distorted thinking plays in creating fears, excessive worry, and ruminations.  (Decklyn participated in the creation of the treatment plan)    [name]

## 2024-06-13 DIAGNOSIS — M62552 Muscle wasting and atrophy, not elsewhere classified, left thigh: Secondary | ICD-10-CM | POA: Diagnosis not present

## 2024-06-13 DIAGNOSIS — Z9181 History of falling: Secondary | ICD-10-CM | POA: Diagnosis not present

## 2024-06-13 DIAGNOSIS — R42 Dizziness and giddiness: Secondary | ICD-10-CM | POA: Diagnosis not present

## 2024-06-13 DIAGNOSIS — M62551 Muscle wasting and atrophy, not elsewhere classified, right thigh: Secondary | ICD-10-CM | POA: Diagnosis not present

## 2024-06-13 DIAGNOSIS — R2689 Other abnormalities of gait and mobility: Secondary | ICD-10-CM | POA: Diagnosis not present

## 2024-06-13 DIAGNOSIS — R2681 Unsteadiness on feet: Secondary | ICD-10-CM | POA: Diagnosis not present

## 2024-06-16 DIAGNOSIS — Z9181 History of falling: Secondary | ICD-10-CM | POA: Diagnosis not present

## 2024-06-21 DIAGNOSIS — Z9181 History of falling: Secondary | ICD-10-CM | POA: Diagnosis not present

## 2024-06-21 DIAGNOSIS — R2681 Unsteadiness on feet: Secondary | ICD-10-CM | POA: Diagnosis not present

## 2024-06-21 DIAGNOSIS — R42 Dizziness and giddiness: Secondary | ICD-10-CM | POA: Diagnosis not present

## 2024-06-21 DIAGNOSIS — M62551 Muscle wasting and atrophy, not elsewhere classified, right thigh: Secondary | ICD-10-CM | POA: Diagnosis not present

## 2024-06-21 DIAGNOSIS — R2689 Other abnormalities of gait and mobility: Secondary | ICD-10-CM | POA: Diagnosis not present

## 2024-06-21 DIAGNOSIS — M62552 Muscle wasting and atrophy, not elsewhere classified, left thigh: Secondary | ICD-10-CM | POA: Diagnosis not present

## 2024-06-22 DIAGNOSIS — Z9181 History of falling: Secondary | ICD-10-CM | POA: Diagnosis not present

## 2024-06-22 DIAGNOSIS — M62551 Muscle wasting and atrophy, not elsewhere classified, right thigh: Secondary | ICD-10-CM | POA: Diagnosis not present

## 2024-06-22 DIAGNOSIS — M62552 Muscle wasting and atrophy, not elsewhere classified, left thigh: Secondary | ICD-10-CM | POA: Diagnosis not present

## 2024-06-22 DIAGNOSIS — R2681 Unsteadiness on feet: Secondary | ICD-10-CM | POA: Diagnosis not present

## 2024-06-22 DIAGNOSIS — R2689 Other abnormalities of gait and mobility: Secondary | ICD-10-CM | POA: Diagnosis not present

## 2024-06-24 ENCOUNTER — Ambulatory Visit (INDEPENDENT_AMBULATORY_CARE_PROVIDER_SITE_OTHER): Admitting: Psychology

## 2024-06-24 DIAGNOSIS — F330 Major depressive disorder, recurrent, mild: Secondary | ICD-10-CM

## 2024-06-24 DIAGNOSIS — F411 Generalized anxiety disorder: Secondary | ICD-10-CM

## 2024-06-24 NOTE — Progress Notes (Unsigned)
 [facility] Counselor/Therapist Progress Note  Patient ID: [name], MRN: [id number]    Date: 06/24/24  Time Spent: 10:00 am - 10:17 am : 17 minutes by phone (Patient was not feeling well physically, and after 17 minutes she asked if we could end the session)  Treatment Type: Individual Therapy.  Reported Symptoms: Patient reported feeling worried and hopeless because she was feeling physically ill.   Mental Status Exam: Appearance:  NA     Behavior: Appropriate  Motor: Restlestness  Speech/Language:  Slow  Affect: Depressed  Mood: anxious  Thought process: normal  Thought content:   WNL  Sensory/Perceptual disturbances:   WNL  Orientation: oriented to person, place, and time/date  Attention: Good  Concentration: Good  Memory: WNL  Fund of knowledge:  Good  Insight:   Fair  Judgment:  Good  Impulse Control: Good   Risk Assessment: Danger to Self:  No Self-injurious Behavior: No Danger to Others: No Duty to Warn:no Physical Aggression / Violence:No  Access to Firearms a concern: No  Gang Involvement:No   Subjective:   [name] participated from home, via phone, and consented to treatment. I discussed the limitations of evaluation and management by telemedicine and the availability of in person appointments. The patient expressed understanding and agreed to proceed.  Therapist participated from home office.  Dashley reviewed the events of the past week.   Patient reported feeling worried and hopeless because she was feeling physically ill. Patient was waiting for [name], one of the teachers, to come up and check on me'. This clinical research associate received patient's permission to contact the main number of [facility] to request that [name] go to the patient's room right away. This clinical research associate spoke with [name] at the front desk of [facility] and someone was already headed to the patient's room to offer her assistance.   Interventions: Cognitive Behavioral  Therapy  Diagnosis: Major depressive disorder, recurrent, mild F33.0, Generalized Anxiety Disorder 41.1  Psychiatric Treatment: Yes , via psychiatrist  Treatment Plan:  Client Abilities/Strengths Zamira is expressive and engaging  Support System: Adult son and daughter in law  Client Treatment Preferences OPT  Client Statement of Needs [name] would like to share my worries so I'll feel better.   Treatment Level Weekly  Symptoms  [name] experiences symptoms of anxiety and depression (Status: maintained)  Goals:   Target Date: 1.28.26 Frequency: Biweekly  Progress: 35% Modality: individual    Therapist will provide referrals for additional resources as appropriate.  Therapist will provide psycho-education regarding [name]'s diagnosis and corresponding treatment approaches and interventions. [name] will support the patient's ability to achieve the goals identified. will employ CBT, BA, Problem-solving, Solution Focused, Mindfulness,  coping skills, & other evidenced-based practices will be used to promote progress towards healthy functioning to help manage decrease symptoms associated with their diagnosis.   Reduce overall level, frequency, and intensity of the feelings of depression, anxiety and panic evidenced by decreased overall symptoms from 6 to 7 days/week to 0 to 1 days/week per client report for at least 3 consecutive months. Verbally express understanding of the relationship between feelings of depression and anxiety and their impact on thinking patterns and behaviors. Verbalize an understanding of the role that distorted thinking plays in creating fears, excessive worry, and ruminations.  (Sarabi participated in the creation of the treatment plan)    [name]

## 2024-06-28 DIAGNOSIS — M62551 Muscle wasting and atrophy, not elsewhere classified, right thigh: Secondary | ICD-10-CM | POA: Diagnosis not present

## 2024-06-28 DIAGNOSIS — R2689 Other abnormalities of gait and mobility: Secondary | ICD-10-CM | POA: Diagnosis not present

## 2024-06-28 DIAGNOSIS — M62552 Muscle wasting and atrophy, not elsewhere classified, left thigh: Secondary | ICD-10-CM | POA: Diagnosis not present

## 2024-06-28 DIAGNOSIS — R2681 Unsteadiness on feet: Secondary | ICD-10-CM | POA: Diagnosis not present

## 2024-06-28 DIAGNOSIS — Z9181 History of falling: Secondary | ICD-10-CM | POA: Diagnosis not present

## 2024-06-28 DIAGNOSIS — R42 Dizziness and giddiness: Secondary | ICD-10-CM | POA: Diagnosis not present

## 2024-06-30 ENCOUNTER — Encounter: Payer: Self-pay | Admitting: Family Medicine

## 2024-06-30 ENCOUNTER — Ambulatory Visit: Admitting: Family Medicine

## 2024-06-30 VITALS — BP 110/64 | HR 63 | Temp 99.1°F | Ht 60.0 in | Wt 150.0 lb

## 2024-06-30 DIAGNOSIS — G301 Alzheimer's disease with late onset: Secondary | ICD-10-CM

## 2024-06-30 DIAGNOSIS — I10 Essential (primary) hypertension: Secondary | ICD-10-CM | POA: Diagnosis not present

## 2024-06-30 DIAGNOSIS — R441 Visual hallucinations: Secondary | ICD-10-CM

## 2024-06-30 DIAGNOSIS — E785 Hyperlipidemia, unspecified: Secondary | ICD-10-CM | POA: Diagnosis not present

## 2024-06-30 DIAGNOSIS — F02A2 Dementia in other diseases classified elsewhere, mild, with psychotic disturbance: Secondary | ICD-10-CM

## 2024-06-30 MED ORDER — DIVALPROEX SODIUM 125 MG PO DR TAB: 125.0000 mg | DELAYED_RELEASE_TABLET | Freq: Two times a day (BID) | ORAL | 5 refills | Status: DC

## 2024-06-30 MED ORDER — BIOFREEZE 10 % EX CREA: TOPICAL_CREAM | CUTANEOUS | 2 refills | Status: AC

## 2024-06-30 NOTE — Progress Notes (Signed)
 Phone [phone] In person visit   Subjective:   [name] is a 88 y.o. year old very pleasant female patient who presents for/with See problem oriented charting Chief Complaint  Patient presents with   Medical Management of Chronic Issues    5 month follow up; diarrhea x3 months, has had three bouts today; no stomach pain;     Past Medical History-  Patient Active Problem List   Diagnosis Date Noted   Dementia (HCC) 12/04/2022    Priority: High   Visual hallucinations     Priority: High   Generalized anxiety disorder 12/03/2022    Priority: Medium    Major depressive disorder in remission 10/22/2021    Priority: Medium    Aortic atherosclerosis 01/17/2021    Priority: Medium    Liver lesion 11/10/2017    Priority: Medium    Lumbar stenosis with neurogenic claudication 03/20/2016    Priority: Medium    Macular degeneration of right eye 03/03/2013    Priority: Medium    Glaucoma 03/03/2013    Priority: Medium    Hyperlipidemia 03/14/2009    Priority: Medium    Essential hypertension 10/19/2007    Priority: Medium    GERD (gastroesophageal reflux disease) 10/19/2007    Priority: Medium    Chest pain 01/24/2018    Priority: Low   Chronic daily headache 07/08/2017    Priority: Low   Post-traumatic headache 10/11/2015    Priority: Low   Osteoarthritis 10/19/2007    Priority: Low   Stress incontinence 10/19/2007    Priority: Low   Allergic rhinitis 10/19/2007    Priority: Low   Osteopenia of neck of right femur 03/29/2019    Medications- reviewed and updated Current Outpatient Medications  Medication Sig Dispense Refill   COMBIGAN  0.2-0.5 % ophthalmic solution instill 1 drop into both eyes twice a day. 5 mL 3   donepezil  (ARICEPT ) 10 MG tablet Take 1 tablet (10 mg total) by mouth at bedtime. 30 tablet 11   ezetimibe -simvastatin  (VYTORIN ) 10-20 MG tablet TAKE ONE TABLET BY MOUTH DAILY 90 tablet 1   memantine  (NAMENDA ) 5 MG tablet Take 1 tablet (5 mg at  night) for 2 weeks, then increase to 1 tablet (5 mg) twice a day 180 tablet 3   Menthol , Topical Analgesic, (BIOFREEZE) 10 % CREA Use up to twice daily as needed 85 g 2   Multiple Vitamins-Minerals (MULTIVITAMIN ADULT) CHEW Chew 2 each by mouth daily. Nature Made for Her     nadolol  (CORGARD ) 20 MG tablet TAKE ONE TABLET BY MOUTH AT BEDTIME 90 tablet 3   omeprazole  (PRILOSEC) 20 MG capsule Take 20 mg by mouth daily. In the morning for acid reflux over the counter     traZODone  (DESYREL ) 100 MG tablet take 1/4 TO 1/2 tablet AT BEDTIME 45 tablet 3   venlafaxine  XR (EFFEXOR -XR) 150 MG 24 hr capsule TAKE ONE CAPSULE BY MOUTH EVERY MORNING 90 capsule 3   cephALEXin  (KEFLEX ) 250 MG capsule Take 250 mg by mouth daily. (Patient not taking: Reported on 06/30/2024)     divalproex  (DEPAKOTE ) 125 MG DR tablet Take 1 tablet (125 mg total) by mouth 2 (two) times daily. For hallucinations 60 tablet 5   No current facility-administered medications for this visit.     Objective:  BP 110/64 (BP Location: Left Arm, Patient Position: Sitting, Cuff Size: Normal)   Pulse 63   Temp 99.1 F (37.3 C) (Temporal)   Ht 5' (1.524 m)   Wt 150 lb (  68 kg)   LMP 08/11/1974   SpO2 97%   BMI 29.29 kg/m  Gen: NAD, resting comfortably CV: RRR no murmurs rubs or gallops Lungs: CTAB no crackles, wheeze, rhonchi Ext: no edema Skin: warm, dry Neuro: walks with walker    Assessment and Plan   #social update- moving to first floor of [facility]- easier with walking dog and also more support with her memory and hallucinations  #Emergency Department visit- no chest pain since that day. Had reassuring workup. Thought possibly reflux- she will return if recurrence  # Diarrhea for 3 months S: Patient reports 3  bouts of bowel movements today- reports mushy but not watery A/P: she reports this is tolerable - I offered to investigate this but she declines. Could also look at medications.      # Early Alzheimer's per  Dr. Richie on 01/15/2023 S: Medication: donepezil  10 mg daily, namenda 5 mg twice a day -Patient has dealt with hallucinations for years-mainly at night but keeping the blinds closed helps-depakote  has helped slightly with it being as pervasive- does bother her A/P: dementia ongoing and on appropriate medicine. The main thing is the hallucinations are still bothering her- we opted to try depakote  125 mg twice daily instead of just at night -has had recent EKG without prolonged QT  # Depression S: Medication: Venlafaxine  150 mg extended release in the morning, trazodone  100 mg daily  -working with therapy Jenkins Nicolas- still sees her    06/30/2024    1:08 PM 01/29/2024    1:12 PM 11/18/2023    1:47 PM  Depression screen PHQ 2/9  Decreased Interest 0 0 0  Down, Depressed, Hopeless 0 0 0  PHQ - 2 Score 0 0 0  Altered sleeping 1 0 0  Tired, decreased energy 2 2 0  Change in appetite 0 0 0  Feeling bad or failure about yourself  0 0 0  Trouble concentrating 2 1 0  Moving slowly or fidgety/restless 0 0 0  Suicidal thoughts 0 0 0  PHQ-9 Score 5 3  0   Difficult doing work/chores Somewhat difficult Not difficult at all Not difficult at all     Data saved with a previous flowsheet row definition  A/P: reasonable control with phq9 just at 5- continue current medications    #hypertension S: medication: Nadolol  20 mg daily A/P: stable- continue current medicines   #hyperlipidemia # Aortic atherosclerosis S: Medication:Vytorin  10-20 mg daily  Lab Results  Component Value Date   CHOL 133 01/29/2024   HDL 43.60 01/29/2024   LDLCALC 44 01/29/2024   LDLDIRECT 84.0 03/06/2017   TRIG 224.0 (H) 01/29/2024   CHOLHDL 3 01/29/2024   A/P: excellent control- continue current medications     # B12 deficiency S: Current treatment/medication (oral vs. IM): 1000 mcg daily - thinks through multivitamin  Lab Results  Component Value Date   VITAMINB12 482 01/29/2024  A/P: well controlled continue  current medications    # Glaucoma-follows with ophthalmology-encouraged follow-up  -also has macular degeneration in opposite eye   #urinary tract infection prophylaxis with cephalexin  through urology   Recommended follow up: Return in about 5 months (around 11/28/2024) for followup or sooner if needed.Schedule b4 you leave. Future Appointments  Date Time Provider Department Center  07/22/2024 10:00 AM Nicolas Jenkins HERO [facility] [id number]  11/16/2024 11:00 AM Dina Camie BRAVO, PA-C [facility] None  11/23/2024  1:00 PM [facility] ANNUAL WELLNESS VISIT 1 [facility]    Lab/Order associations:  ICD-10-CM   1. Visual hallucinations  R44.1     2. 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 Hyperlipidemia, unspecified hyperlipidemia type  E78.5     4. Essential hypertension  I10       Meds ordered this encounter  Medications   divalproex  (DEPAKOTE ) 125 MG DR tablet    Sig: Take 1 tablet (125 mg total) by mouth 2 (two) times daily. For hallucinations    Dispense:  60 tablet    Refill:  5   Menthol , Topical Analgesic, (BIOFREEZE) 10 % CREA    Sig: Use up to twice daily as needed    Dispense:  85 g    Refill:  2    Return precautions advised.  Garnette Lukes, MD

## 2024-06-30 NOTE — Patient Instructions (Addendum)
 The main thing is the hallucinations are still bothering her- we opted to try depakote  125 mg twice daily instead of just at night  We opted for labs next visit since you had some checked about a month ago  Recommended follow up: Return in about 5 months (around 11/28/2024) for followup or sooner if needed.Schedule b4 you leave. Offered 3 months but you wanted to go for 5

## 2024-07-04 DIAGNOSIS — Z9181 History of falling: Secondary | ICD-10-CM | POA: Diagnosis not present

## 2024-07-04 DIAGNOSIS — M62551 Muscle wasting and atrophy, not elsewhere classified, right thigh: Secondary | ICD-10-CM | POA: Diagnosis not present

## 2024-07-04 DIAGNOSIS — R2681 Unsteadiness on feet: Secondary | ICD-10-CM | POA: Diagnosis not present

## 2024-07-04 DIAGNOSIS — R2689 Other abnormalities of gait and mobility: Secondary | ICD-10-CM | POA: Diagnosis not present

## 2024-07-04 DIAGNOSIS — M62552 Muscle wasting and atrophy, not elsewhere classified, left thigh: Secondary | ICD-10-CM | POA: Diagnosis not present

## 2024-07-04 DIAGNOSIS — R42 Dizziness and giddiness: Secondary | ICD-10-CM | POA: Diagnosis not present

## 2024-07-05 DIAGNOSIS — R42 Dizziness and giddiness: Secondary | ICD-10-CM | POA: Diagnosis not present

## 2024-07-05 DIAGNOSIS — M62552 Muscle wasting and atrophy, not elsewhere classified, left thigh: Secondary | ICD-10-CM | POA: Diagnosis not present

## 2024-07-05 DIAGNOSIS — M62551 Muscle wasting and atrophy, not elsewhere classified, right thigh: Secondary | ICD-10-CM | POA: Diagnosis not present

## 2024-07-05 DIAGNOSIS — R2689 Other abnormalities of gait and mobility: Secondary | ICD-10-CM | POA: Diagnosis not present

## 2024-07-05 DIAGNOSIS — R2681 Unsteadiness on feet: Secondary | ICD-10-CM | POA: Diagnosis not present

## 2024-07-05 DIAGNOSIS — Z9181 History of falling: Secondary | ICD-10-CM | POA: Diagnosis not present

## 2024-07-06 DIAGNOSIS — R2689 Other abnormalities of gait and mobility: Secondary | ICD-10-CM | POA: Diagnosis not present

## 2024-07-06 DIAGNOSIS — G4700 Insomnia, unspecified: Secondary | ICD-10-CM | POA: Diagnosis not present

## 2024-07-06 DIAGNOSIS — M62552 Muscle wasting and atrophy, not elsewhere classified, left thigh: Secondary | ICD-10-CM | POA: Diagnosis not present

## 2024-07-06 DIAGNOSIS — F0280 Dementia in other diseases classified elsewhere without behavioral disturbance: Secondary | ICD-10-CM | POA: Diagnosis not present

## 2024-07-06 DIAGNOSIS — R2681 Unsteadiness on feet: Secondary | ICD-10-CM | POA: Diagnosis not present

## 2024-07-06 DIAGNOSIS — M62551 Muscle wasting and atrophy, not elsewhere classified, right thigh: Secondary | ICD-10-CM | POA: Diagnosis not present

## 2024-07-06 DIAGNOSIS — K219 Gastro-esophageal reflux disease without esophagitis: Secondary | ICD-10-CM | POA: Diagnosis not present

## 2024-07-06 DIAGNOSIS — E782 Mixed hyperlipidemia: Secondary | ICD-10-CM | POA: Diagnosis not present

## 2024-07-06 DIAGNOSIS — R42 Dizziness and giddiness: Secondary | ICD-10-CM | POA: Diagnosis not present

## 2024-07-06 DIAGNOSIS — Z9181 History of falling: Secondary | ICD-10-CM | POA: Diagnosis not present

## 2024-07-15 ENCOUNTER — Ambulatory Visit: Admitting: Psychology

## 2024-07-15 DIAGNOSIS — F411 Generalized anxiety disorder: Secondary | ICD-10-CM | POA: Diagnosis not present

## 2024-07-15 DIAGNOSIS — F330 Major depressive disorder, recurrent, mild: Secondary | ICD-10-CM

## 2024-07-15 NOTE — Progress Notes (Signed)
 [facility] Counselor/Therapist Progress Note  Patient ID: [name], MRN: [id number]    Date: 07/15/24  Time Spent: 10:12 am - 11:06 am : 54 minutes  Treatment Type: Individual Therapy.  Reported Symptoms: Patient reported feeling very sad because she misses her deceased husband and her son [name].   Mental Status Exam: Appearance:  NA     Behavior: Sharing  Motor: Restlestness  Speech/Language:  Slow  Affect: Depressed  Mood: anxious  Thought process: normal  Thought content:   WNL  Sensory/Perceptual disturbances:   WNL  Orientation: oriented to person, place, and time/date  Attention: Good  Concentration: Good  Memory: WNL  Fund of knowledge:  Good  Insight:   Fair  Judgment:  Good  Impulse Control: Good   Risk Assessment: Danger to Self:  No Self-injurious Behavior: No Danger to Others: No Duty to Warn:no Physical Aggression / Violence:No  Access to Firearms a concern: No  Gang Involvement:No   Subjective:   Alla C Herren participated from home, via phone, and consented to treatment. I discussed the limitations of evaluation and management by telemedicine and the availability of in person appointments. The patient expressed understanding and agreed to proceed.  Therapist participated from home office.  Caileen reviewed the events of the past week.   Patient was tearful as she talked about feeling sad about a lot of things. Patient talked about missing her deceased husband, [name], who loved Christmas.   Interventions: Cognitive Behavioral Therapy  Diagnosis: Major depressive disorder, recurrent, mild F33.0, Generalized Anxiety Disorder 41.1   Psychiatric Treatment: Yes , via psychiatrist   Treatment Plan:  Client Abilities/Strengths [name] is expressive and engaging.   Support System: Adult son and daughter in law  Client Treatment Preferences OPT  Client Statement of Needs [name] would like to share my worries so I'll feel  better.   Treatment Level Weekly  Symptoms  Yuritzy experiences symptoms of anxiety and depression (Status: maintained)  Goals:   Target Date: 1.28.26 Frequency: Weekly  Progress: 40% Modality: individual    Therapist will provide referrals for additional resources as appropriate.  Therapist will provide psycho-education regarding Thai's diagnosis and corresponding treatment approaches and interventions. [name] will support the patient's ability to achieve the goals identified. will employ CBT, BA, Problem-solving, Solution Focused, Mindfulness,  coping skills, & other evidenced-based practices will be used to promote progress towards healthy functioning to help manage decrease symptoms associated with their diagnosis.   Reduce overall level, frequency, and intensity of the feelings of depression, anxiety and panic evidenced by decreased overall symptoms from 6 to 7 days/week to 0 to 1 days/week per client report for at least 3 consecutive months. Verbally express understanding of the relationship between feelings of depression and anxiety and their impact on thinking patterns and behaviors. Verbalize an understanding of the role that distorted thinking plays in creating fears, excessive worry, and ruminations.  ([name] participated in the creation of the treatment plan)    [name]

## 2024-07-19 ENCOUNTER — Ambulatory Visit: Payer: Self-pay

## 2024-07-19 NOTE — Telephone Encounter (Signed)
 Noted! Thank you

## 2024-07-19 NOTE — Telephone Encounter (Signed)
 FYI Only or Action Required?: FYI only for provider: pharmacy called to double check new prescription for depakote , all questions answered.  Patient was last seen in primary care on 06/30/2024 by Katrinka Garnette KIDD, MD.  Called Nurse Triage reporting Advice Only.  Symptoms began n/a.  Interventions attempted: Other: n/a.  Symptoms are: n/a.  Triage Disposition: Information or Advice Only Call  Patient/caregiver understands and will follow disposition?: Yes  Copied from CRM #[id number]. Topic: Clinical - Medication Question >> Jul 19, 2024 10:49 AM Suzen RAMAN wrote: Reason for CRM: Sari from [facility] on the line needs clarification on medication divalproex  (DEPAKOTE ) 125 MG DR tablet Reason for Disposition  Pharmacy calling with prescription question and triager answers question  Answer Assessment - Initial Assessment Questions 1. NAME of MEDICINE: What medicine(s) are you calling about?     Depakote  2. QUESTION: What is your question? (e.g., double dose of medicine, side effect)     Verifying new instructions say 2x a day.  3. PRESCRIBER: Who prescribed the medicine? Reason: if prescribed by specialist, call should be referred to that group.     Dr. Katrinka Sari from [facility] is making her blister packs and just wanted to verify that the dosage had changed.  No further questions or concerns at this time.  Protocols used: Medication Question Call-A-AH

## 2024-07-21 ENCOUNTER — Ambulatory Visit: Payer: Self-pay | Admitting: Family Medicine

## 2024-07-21 ENCOUNTER — Ambulatory Visit: Admitting: Family Medicine

## 2024-07-21 ENCOUNTER — Encounter: Payer: Self-pay | Admitting: Family Medicine

## 2024-07-21 VITALS — BP 128/62 | HR 78 | Temp 97.7°F | Ht 60.0 in | Wt 149.4 lb

## 2024-07-21 DIAGNOSIS — I10 Essential (primary) hypertension: Secondary | ICD-10-CM

## 2024-07-21 DIAGNOSIS — H938X3 Other specified disorders of ear, bilateral: Secondary | ICD-10-CM

## 2024-07-21 DIAGNOSIS — R300 Dysuria: Secondary | ICD-10-CM

## 2024-07-21 DIAGNOSIS — R441 Visual hallucinations: Secondary | ICD-10-CM

## 2024-07-21 LAB — COMPREHENSIVE METABOLIC PANEL WITH GFR
ALT: 11 U/L (ref 0–35)
AST: 15 U/L (ref 0–37)
Albumin: 4.4 g/dL (ref 3.5–5.2)
Alkaline Phosphatase: 63 U/L (ref 39–117)
BUN: 15 mg/dL (ref 6–23)
CO2: 28 meq/L (ref 19–32)
Calcium: 9.4 mg/dL (ref 8.4–10.5)
Chloride: 103 meq/L (ref 96–112)
Creatinine, Ser: 0.81 mg/dL (ref 0.40–1.20)
GFR: 65.57 mL/min (ref >60.00)
Glucose, Bld: 61 mg/dL — ABNORMAL LOW (ref 70–99)
Potassium: 4 meq/L (ref 3.5–5.1)
Sodium: 144 meq/L (ref 135–145)
Total Bilirubin: 0.3 mg/dL (ref 0.2–1.2)
Total Protein: 6.7 g/dL (ref 6.0–8.3)

## 2024-07-21 LAB — CBC WITH DIFFERENTIAL/PLATELET
Basophils Absolute: 0 K/uL (ref 0.0–0.1)
Basophils Relative: 0.6 % (ref 0.0–3.0)
Eosinophils Absolute: 0.1 K/uL (ref 0.0–0.7)
Eosinophils Relative: 1.1 % (ref 0.0–5.0)
HCT: 43.8 % (ref 36.0–46.0)
Hemoglobin: 14.7 g/dL (ref 12.0–15.0)
Lymphocytes Relative: 22.1 % (ref 12.0–46.0)
Lymphs Abs: 1.6 K/uL (ref 0.7–4.0)
MCHC: 33.5 g/dL (ref 30.0–36.0)
MCV: 96.9 fl (ref 78.0–100.0)
Monocytes Absolute: 0.5 K/uL (ref 0.1–1.0)
Monocytes Relative: 6.5 % (ref 3.0–12.0)
Neutro Abs: 5.2 K/uL (ref 1.4–7.7)
Neutrophils Relative %: 69.7 % (ref 43.0–77.0)
Platelets: 194 K/uL (ref 150.0–400.0)
RBC: 4.52 Mil/uL (ref 3.87–5.11)
RDW: 13.9 % (ref 11.5–15.5)
WBC: 7.5 K/uL (ref 4.0–10.5)

## 2024-07-21 LAB — TSH: TSH: 2.55 u[IU]/mL (ref 0.35–5.50)

## 2024-07-21 MED ORDER — DIVALPROEX SODIUM 125 MG PO DR TAB: 125.0000 mg | DELAYED_RELEASE_TABLET | Freq: Every evening | ORAL | 5 refills | Status: AC

## 2024-07-21 NOTE — Patient Instructions (Addendum)
 I need to know if you are taking cephalexin /keflex  for urinary tract infection prevention from urology  After your current pack of pills lets go back down to depakote /divalproex  once nightly instead of twice daily in case this is causing some fatigue, more dizziness. Plus urinary tract infection could be causing this.   Please stop by lab before you go If you have mychart- we will send your results within 3 business days of us  receiving them.  If you do not have mychart- we will call you about results within 5 business days of us  receiving them.  *please also note that you will see labs on mychart as soon as they post. I will later go in and write notes on them- will say notes from Dr. Katrinka   Recommended follow up: Return in about 2 months (around 09/21/2024) for followup or sooner if needed.Schedule b4 you leave.

## 2024-07-21 NOTE — Progress Notes (Signed)
 Phone [phone] In person visit   Subjective:   [name] is a 88 y.o. year old very pleasant female patient who presents for/with See problem oriented charting Chief Complaint  Patient presents with   Ear Problem    Pt stated she is having a hard time hearing and they feel full; dizziness;    Medical Management of Chronic Issues    Pt states she thinks she has a uti due to buring with urination and frequency; has been having consistent diarrhea every morning when she wakes up;     Past Medical History-  Patient Active Problem List   Diagnosis Date Noted   Dementia (HCC) 12/04/2022    Priority: High   Visual hallucinations     Priority: High   Generalized anxiety disorder 12/03/2022    Priority: Medium    Major depressive disorder in remission 10/22/2021    Priority: Medium    Aortic atherosclerosis 01/17/2021    Priority: Medium    Liver lesion 11/10/2017    Priority: Medium    Lumbar stenosis with neurogenic claudication 03/20/2016    Priority: Medium    Macular degeneration of right eye 03/03/2013    Priority: Medium    Glaucoma 03/03/2013    Priority: Medium    Hyperlipidemia 03/14/2009    Priority: Medium    Essential hypertension 10/19/2007    Priority: Medium    GERD (gastroesophageal reflux disease) 10/19/2007    Priority: Medium    Chest pain 01/24/2018    Priority: Low   Chronic daily headache 07/08/2017    Priority: Low   Post-traumatic headache 10/11/2015    Priority: Low   Osteoarthritis 10/19/2007    Priority: Low   Stress incontinence 10/19/2007    Priority: Low   Allergic rhinitis 10/19/2007    Priority: Low   Osteopenia of neck of right femur 03/29/2019    Medications- reviewed and updated Current Outpatient Medications  Medication Sig Dispense Refill   cephALEXin  (KEFLEX ) 250 MG capsule Take 250 mg by mouth daily. (Patient not taking: Reported on 06/30/2024)     COMBIGAN  0.2-0.5 % ophthalmic solution instill 1 drop into both  eyes twice a day. 5 mL 3   divalproex  (DEPAKOTE ) 125 MG DR tablet Take 1 tablet (125 mg total) by mouth at bedtime. For hallucinations 30 tablet 5   donepezil  (ARICEPT ) 10 MG tablet Take 1 tablet (10 mg total) by mouth at bedtime. 30 tablet 11   ezetimibe -simvastatin  (VYTORIN ) 10-20 MG tablet TAKE ONE TABLET BY MOUTH DAILY 90 tablet 1   memantine  (NAMENDA ) 5 MG tablet Take 1 tablet (5 mg at night) for 2 weeks, then increase to 1 tablet (5 mg) twice a day 180 tablet 3   Menthol , Topical Analgesic, (BIOFREEZE) 10 % CREA Use up to twice daily as needed 85 g 2   Multiple Vitamins-Minerals (MULTIVITAMIN ADULT) CHEW Chew 2 each by mouth daily. Nature Made for Her     nadolol  (CORGARD ) 20 MG tablet TAKE ONE TABLET BY MOUTH AT BEDTIME 90 tablet 3   omeprazole  (PRILOSEC) 20 MG capsule Take 20 mg by mouth daily. In the morning for acid reflux over the counter     traZODone  (DESYREL ) 100 MG tablet take 1/4 TO 1/2 tablet AT BEDTIME 45 tablet 3   venlafaxine  XR (EFFEXOR -XR) 150 MG 24 hr capsule TAKE ONE CAPSULE BY MOUTH EVERY MORNING 90 capsule 3   No current facility-administered medications for this visit.     Objective:  BP 128/62 (BP Location:  Left Arm, Patient Position: Sitting, Cuff Size: Normal)   Pulse 78   Temp 97.7 F (36.5 C) (Temporal)   Ht 5' (1.524 m)   Wt 149 lb 6.4 oz (67.8 kg)   LMP 08/11/1974   SpO2 98%   BMI 29.18 kg/m  Gen: NAD, resting comfortably Bilateral cerumen impaction though not fully obstructive removed with curette CV: RRR no murmurs rubs or gallops Lungs: CTAB no crackles, wheeze, rhonchi Abdomen: soft/nontender/nondistended/normal bowel sounds. No rebound or guarding.  Ext: no edema Skin: warm, dry Neuro: hard of hearing even after cerumen removal    Assessment and Plan   # Social update-she moved to the first floor but still seeing things that are not there (particular people looking at her at night through her window)as per dementia with behavioral  disturbance section  # Ear fullness S:reports difficulty hearing and fullness sensation. Some dizziness. Wants to check for wax  A/P: Some cerumen in bilateral ears.  Removed this and she reported mild improvement in her ear fullness symptoms.  Remains hard of hearing and could send audiology.  As far as the dizziness she also reports some ear fullness as noted but tympanic membrane's are normal-still could be eustachian tube dysfunction and discussed possible trial of Flonase but she is on to try this.  Also I do not recall her reporting the dizziness at last visit and wondering if increasing Depakote  may have contributed-go back down to once a day to see if this improves situation  #Concern for UTI S: Patients symptoms started about 20 days ago. Cephalexin  daily  Complains of dysuria: YES; polyuria: possible but wears pad; nocturia: possible but wears pad; urgency: no.  Symptoms are worsening.  ROS- no fever, chills, nausea, vomiting, flank pain reported. No blood in urine reported  A/P: UA to be obtained. Likely UTI. Will get culture. Empiric treatment hold off with reported duration Patient to follow up if new or worsening symptoms or failure to improve.  -She is supposed to be on Keflex  at baseline for UTI prevention-she is not 100 unsure if she is on this and she will go home and check  #diarrhea each am- or at least loose stools- she thinks related to food she is eating- declines workup outside of labs including TSH.   # Hallucinations-patient with ongoing hallucinations related to her dementia and at last visit we trialed increasing Depakote  from just nightly to twice daily but she now reports more fatigue, more dizziness and I wonder if this could be related to medication-we opted since she is not sure she has seen an improved benefit in her hallucinations to go back to once a day.  Of note she did not note any daytime hallucinations at this point so there may be some improvement but if  worsens we can reconsider especially if her symptoms do not resolve - phq9 score up slightly but could be related to the fatigue-we also discussed UTI could be causing this -Also with her fatigue she plans to try a multivitamin-apparently she has stopped taking this and sinus is going to try to help her set this back up since it is not in her pill pack-I think it is reasonable and low vitamin D would certainly be a possibility  #hypertension S: medication: Nadolol  20 mg daily BP Readings from Last 3 Encounters:  07/21/24 128/62  06/30/24 110/64  05/15/24 (!) 176/86  A/P: Blood pressure is well-controlled on nadolol  alone-continue current medication  Recommended follow up: Return in about 2 months (  around 09/21/2024) for followup or sooner if needed.Schedule b4 you leave. Future Appointments  Date Time Provider Department Center  07/22/2024 10:00 AM Maudie Jenkins HERO [id number] [id number]  09/22/2024 11:00 AM Katrinka Garnette KIDD, MD [facility]  11/16/2024 11:00 AM Dina Camie BRAVO, PA-C [facility] None  11/23/2024  1:00 PM [facility] ANNUAL WELLNESS VISIT 1 [facility]    Lab/Order associations:   ICD-10-CM   1. Essential hypertension  I10 Comprehensive metabolic panel with GFR    CBC with Differential/Platelet    TSH    2. Visual hallucinations  R44.1     3. Ear fullness, bilateral  H93.8X3     4. Dysuria  R30.0 Urine Culture    CANCELED: POCT URINALYSIS DIP (CLINITEK)     I personally spent a total of 35 minutes in the care of the patient today including preparing to see the patient, getting/reviewing separately obtained history, performing a medically appropriate exam/evaluation, counseling and educating, and documenting clinical information in the EHR.   Meds ordered this encounter  Medications   divalproex  (DEPAKOTE ) 125 MG DR tablet    Sig: Take 1 tablet (125 mg total) by mouth at bedtime. For hallucinations    Dispense:  30 tablet    Refill:  5    Return  precautions advised.  Garnette Katrinka, MD

## 2024-07-22 ENCOUNTER — Ambulatory Visit: Admitting: Psychology

## 2024-07-22 ENCOUNTER — Other Ambulatory Visit: Payer: Self-pay

## 2024-07-22 ENCOUNTER — Telehealth: Payer: Self-pay

## 2024-07-22 DIAGNOSIS — R309 Painful micturition, unspecified: Secondary | ICD-10-CM

## 2024-07-22 DIAGNOSIS — F330 Major depressive disorder, recurrent, mild: Secondary | ICD-10-CM

## 2024-07-22 DIAGNOSIS — F411 Generalized anxiety disorder: Secondary | ICD-10-CM

## 2024-07-22 NOTE — Progress Notes (Signed)
 Left detailed voice message for patient per DPR.

## 2024-07-22 NOTE — Telephone Encounter (Signed)
 I am open to it but message me back once we have urine on hand- I think there is risk to not having the urine culture going before she starts antibiotic(s)

## 2024-07-22 NOTE — Telephone Encounter (Signed)
 Patient did not drop urine off today before close of business today. Son is aware antibiotics would not be ordered until we had urine in house.

## 2024-07-22 NOTE — Telephone Encounter (Signed)
 Copied from CRM #[id number]. Topic: Clinical - Medication Question >> Jul 22, 2024 11:44 AM [name] wrote: Reason for CRM: Patient is requesting pain medication, she is having pain when using the restroom.    Also patient has no ride to bring in urine sample.

## 2024-07-22 NOTE — Progress Notes (Signed)
 [facility] Counselor/Therapist Progress Note  Patient ID: KYLIEE ORTEGO, MRN: [id number]    Date: 07/22/2024  Time Spent: 10:04 am - 10:57 am :  53 Minutes  Treatment Type: Individual Therapy.  Reported Symptoms: Patient reported feeling down and not having any energy.   Mental Status Exam: Appearance:  NA     Behavior: Sharing  Motor: Restlestness  Speech/Language:  Pressured  Affect: Depressed  Mood: anxious  Thought process: normal  Thought content:   WNL  Sensory/Perceptual disturbances:   WNL  Orientation: oriented to person, place, and time/date  Attention: Good  Concentration: Good  Memory: WNL  Fund of knowledge:  Good  Insight:   Fair  Judgment:  Good  Impulse Control: Good   Risk Assessment: Danger to Self:  No Self-injurious Behavior: No Danger to Others: No Duty to Warn:no Physical Aggression / Violence:No  Access to Firearms a concern: No  Gang Involvement:No   Subjective:   Jordanne C Nottingham participated from home, via phone, and consented to treatment. I discussed the limitations of evaluation and management by telemedicine and the availability of in person appointments. The patient expressed understanding and agreed to proceed.  Therapist participated from home office.  Nakeya reviewed the events of the past week.   Patient received her blood work (via her son [name]) and the only result that was low was my blood sugar. Patient continued to feel down about the death of her husband, Mallissa, who loved Christmas. Patient discussed how music helps to motivate her.   Interventions: Cognitive Behavioral Therapy  Diagnosis: Major depressive disorder, recurrent, mild F33.0, Generalized Anxiety Disorder, 41.1  Psychiatric Treatment: Yes , via psychiatrist   Treatment Plan:  Client Abilities/Strengths Seline is expressive and engaging  Support System: Adult son, [name], and daughter in social worker  Client Treatment Preferences OPT  Client  Statement of Needs Marelly would like to share my worries so I'll feel better.    Treatment Level Biweekly  Symptoms  [name] experiences symptoms of depression and anxiety (Status : maintained)  Goals:   Target Date: 1.28.26 Frequency: Biweekly  Progress: 45% Modality: individual    Therapist will provide referrals for additional resources as appropriate.  Therapist will provide psycho-education regarding Richardine's diagnosis and corresponding treatment approaches and interventions. [name] will support the patient's ability to achieve the goals identified. will employ CBT, BA, Problem-solving, Solution Focused, Mindfulness,  coping skills, & other evidenced-based practices will be used to promote progress towards healthy functioning to help manage decrease symptoms associated with their diagnosis.   Reduce overall level, frequency, and intensity of the feelings of depression, anxiety and panic evidenced by decreased overall symptoms from 6 to 7 days/week to 0 to 1 days/week per client report for at least 3 consecutive months. Verbally express understanding of the relationship between feelings of depression and anxiety and their impact on thinking patterns and behaviors. Verbalize an understanding of the role that distorted thinking plays in creating fears, excessive worry, and ruminations.  (Mira participated in the creation of the treatment plan)     [name]

## 2024-07-22 NOTE — Progress Notes (Signed)
 Spoke with son Skip per DPR. He is planning to speak with mother about eating snacks and drinking juice especially first thing in the morning.

## 2024-07-28 ENCOUNTER — Ambulatory Visit: Payer: Self-pay

## 2024-07-28 ENCOUNTER — Other Ambulatory Visit

## 2024-07-28 NOTE — Telephone Encounter (Signed)
 FYI Only or Action Required?: Action required by provider: clinical question for provider and lab or test result follow-up needed. Pt would like to know results of UA and if abx can be prescribed  Patient was last seen in primary care on 07/21/2024 by Katrinka Garnette KIDD, MD.  Called Nurse Triage reporting Dysuria.  Symptoms began several weeks ago.  Interventions attempted: OTC medications: Urinary pain relief OTC.  Symptoms are: gradually worsening.  Triage Disposition: Call PCP Within 24 Hours (overriding See HCP Within 4 Hours (Or PCP Triage))  Patient/caregiver understands and will follow disposition?: Yes   Burning pain with urination 2 weeks ago, has gotten worse now 10/10. Denies fever, some small amount blood in urine. Frequency.  Denies flank pain or N/V. Her friend dropped off pts urine sample at office today after communicating with provider via mychart last week. Results not ready yet. Called [facility], office closed. Advised pt someone from the office should reach out to pt when UA results are ready and abx may be prescribed depending on the results. Advised UC today if she would like to be seen sooner. Declines as she does not have a ride. Advised she can take OTC urinary pain relief and alternate tylenol  and ibuprofen  in the meantime. Advised UC or ED for worsening symptoms.   Copied from CRM #[id number]. Topic: Clinical - Red Word Triage >> Jul 28, 2024  4:34 PM Drema MATSU wrote: Red Word that prompted transfer to Nurse Triage: Patient had a bladder infection for about a week. Patient friend dropped off a urine sample today and she hasn't heard anything back. She stated that the pain is so intense that she is bending over now. She wants to know if doctor is going send in something. >> Jul 28, 2024  4:46 PM Drema MATSU wrote: Pt disconnected while holding called twice was able to get her and she disconnected again while holding for NT. Called patient again line was busy. Reason for  Disposition  [1] SEVERE pain with urination (e.g., excruciating) AND [2] not improved after 2 hours of pain medicine  Answer Assessment - Initial Assessment Questions 1. SEVERITY: How bad is the pain?  (e.g., Scale 1-10; mild, moderate, or severe)     10/10 burning pain 2. FREQUENCY: How many times have you had painful urination today?      Frequently 3. PATTERN: Is pain present every time you urinate or just sometimes?      Every time 4. ONSET: When did the painful urination start?      2 weeks ago 5. FEVER: Do you have a fever? If Yes, ask: What is your temperature, how was it measured, and when did it start?     Denies 6. PAST UTI: Have you had a urine infection before? If Yes, ask: When was the last time? and What happened that time?      Yes, takes abxs 7. CAUSE: What do you think is causing the painful urination?  (e.g., UTI, scratch, Herpes sore)     UTI 8. OTHER SYMPTOMS: Do you have any other symptoms? (e.g., blood in urine, flank pain, genital sores, urgency, vaginal discharge)     Some blood in urine. Diarrhea. Denies rest of above.  Protocols used: Urination Pain - Female-A-AH

## 2024-07-29 ENCOUNTER — Telehealth: Payer: Self-pay | Admitting: Family Medicine

## 2024-07-29 NOTE — Telephone Encounter (Signed)
 Patient called and stated she will no longer be coming to our office and she will call to get her records changed to another facility outside of [facility].   She stated we are not giving her proper care because we sent her urine out for testing yesterday when it was dropped off and did not check it in the office. I explained to her the type of test that Dr. Katrinka ordered cannot be checked in the office and it must be sent to our main lab and she told me I was wrong and I did not know what I was talking about and she cannot be treated ike this anymore because there is too much turnover in our office.   I explained that we would have the results Monday at the latest and she said that was too long and she was suffering.   Patient ended the phone call.

## 2024-07-29 NOTE — Telephone Encounter (Signed)
 Urine was dropped off 12/18. Culture takes up to 3 days. Results not back yet. Will make patient aware.

## 2024-07-29 NOTE — Telephone Encounter (Signed)
 If she wants to consider antibiotic(s) immediately in future can schedule in person visit- otherwise we typically do send out culture as we did to make sure we have right antibiotic(s)

## 2024-07-31 LAB — URINE CULTURE
MICRO NUMBER:: 17373661
SPECIMEN QUALITY:: ADEQUATE

## 2024-08-01 MED ORDER — SULFAMETHOXAZOLE-TRIMETHOPRIM 800-160 MG PO TABS: 1.0000 | ORAL_TABLET | Freq: Two times a day (BID) | ORAL | 0 refills | Status: AC

## 2024-08-02 NOTE — Telephone Encounter (Signed)
 Spoke with patient today.  She has received medication.  Was originally upset at trying to get through to the office.  I have explained the difference between a UA and urine culture.  Patient states she frequently gets UTI's.  I have advised for her to call our office as soon as she recognizes the symptoms for an appointment rather than waiting.   I have advised patient to reach out to me if she has any other needs or concerns.

## 2024-08-05 ENCOUNTER — Other Ambulatory Visit: Payer: Self-pay | Admitting: Family Medicine

## 2024-08-12 ENCOUNTER — Ambulatory Visit: Admitting: Psychology

## 2024-08-12 DIAGNOSIS — F411 Generalized anxiety disorder: Secondary | ICD-10-CM

## 2024-08-12 DIAGNOSIS — F330 Major depressive disorder, recurrent, mild: Secondary | ICD-10-CM | POA: Diagnosis not present

## 2024-08-12 NOTE — Progress Notes (Signed)
 [facility] Counselor/Therapist Progress Note  Patient ID: SAVANNAH MORFORD, MRN: [id number]    Date: 08/12/2024  Time Spent: 10:04 am - 10:34 am : 30 minutes  short due to pt not feeling well  Treatment Type: Individual Therapy.  Reported Symptoms: Patient continued to report feeling down and not having any energy. She also reported feeling on edge and very worried.   Mental Status Exam: Appearance:  NA     Behavior: Sharing  Motor: Restlestness  Speech/Language:  Pressured  Affect: Depressed  Mood: anxious  Thought process: normal  Thought content:   WNL  Sensory/Perceptual disturbances:   WNL  Orientation: oriented to person, place, and time/date  Attention: Good  Concentration: Good  Memory: WNL  Fund of knowledge:  Good  Insight:   Fair  Judgment:  Fair  Impulse Control: Good   Risk Assessment: Danger to Self:  No Self-injurious Behavior: No Danger to Others: No Duty to Warn:no Physical Aggression / Violence:No  Access to Firearms a concern: No  Gang Involvement:No   Subjective:   Marvine C Taira participated from home, via phone, and consented to treatment. I discussed the limitations of evaluation and management by telemedicine and the availability of in person appointments. The patient expressed understanding and agreed to proceed.  Therapist participated from home office.  Oluwaseun reviewed the events of the past week.   Patient stated that she wasn't feeling well physically but she wanted to continue with our phone session. Patient reported feeling worried and nervous about not feeling well and she had very little energy and felt hopeless.    Interventions: Cognitive Behavioral Therapy  Diagnosis:  Major depressive disorder, recurrent, mild F33.0, Generalized Anxiety Disorder, 41.1   Psychiatric Treatment: Yes , via psychiatrist  Treatment Plan:  Client Abilities/Strengths Sennie is expressive and engaging.  Support System: Adult son,  [name], and daughter in social worker  Client Treatment Preferences OPT  Client Statement of Needs Elanna would like to share my worries so I'll feel better   Treatment Level Weekly  Symptoms  Patient experiences symptoms of depression and anxiety.  Goals:  Target Date: 09/07/24 Frequency: Weekly  Progress: 45% Modality: individual    Therapist will provide referrals for additional resources as appropriate.  Therapist will provide psycho-education regarding Leitha's diagnosis and corresponding treatment approaches and interventions. [name] will support the patient's ability to achieve the goals identified. will employ CBT, BA, Problem-solving, Solution Focused, Mindfulness,  coping skills, & other evidenced-based practices will be used to promote progress towards healthy functioning to help manage decrease symptoms associated with their diagnosis.   Reduce overall level, frequency, and intensity of the feelings of depression, anxiety and panic evidenced by decreased overall symptoms from 6 to 7 days/week to 0 to 1 days/week per client report for at least 3 consecutive months. Verbally express understanding of the relationship between feelings of depression, anxiety and their impact on thinking patterns and behaviors. Verbalize an understanding of the role that distorted thinking plays in creating fears, excessive worry, and ruminations.  (Sherial participated in the creation of the treatment plan)    [name]

## 2024-08-16 ENCOUNTER — Ambulatory Visit: Admitting: Psychology

## 2024-08-16 ENCOUNTER — Other Ambulatory Visit: Payer: Self-pay | Admitting: Family Medicine

## 2024-08-16 DIAGNOSIS — F411 Generalized anxiety disorder: Secondary | ICD-10-CM

## 2024-08-16 DIAGNOSIS — F330 Major depressive disorder, recurrent, mild: Secondary | ICD-10-CM

## 2024-08-16 NOTE — Progress Notes (Signed)
 [facility] Counselor/Therapist Progress Note  Patient ID: [name], MRN: [id number]    Date: 08/16/2024  Time Spent: Phone 10:07 am - 11:05 am : 58 minutes (Patient dropped the phone which disconnected our call but we reconnected within 30 seconds)  Treatment Type: Individual Therapy.  Reported Symptoms: Pt continues to feel hopeless about her relationship with her daughter, and she feels anxious and on edge about her friend Reena.   Mental Status Exam: Appearance:  NA     Behavior: Sharing  Motor: Restlestness  Speech/Language:  Pressured  Affect: Depressed  Mood: anxious  Thought process: normal  Thought content:   WNL  Sensory/Perceptual disturbances:   Some perceptual disturbances  Orientation: oriented to person, place, and time/date  Attention: Good  Concentration: Good  Memory: Some memory issues  Fund of knowledge:  Good  Insight:   Fair  Judgment:  Good  Impulse Control: Good   Risk Assessment: Danger to Self:  No Self-injurious Behavior: No Danger to Others: No Duty to Warn:no Physical Aggression / Violence:No  Access to Firearms a concern: No  Gang Involvement:No   Subjective:   Shamia C Laramie participated from home, via video, and consented to treatment. I discussed the limitations of evaluation and management by telemedicine and the availability of in person appointments. The patient expressed understanding and agreed to proceed.  Therapist participated from home office.  Jalaina reviewed the events of the past week.   Patient woke up when this writer called and she was receptive to having our session even though she wasn't feeling well. Patient's son stopped by during our session to surprise his mother the patient. Skip wants to connect with me by phone to share his concerns for the patient, and he then suggested that he, the pt, and this writer could meet for a family session sometime. Patient continues to feel hopeless about her  relationship with her daughter, and she feels anxious and on edge about how her friend Reena (who also lives at [facility]) is treated by 14 people in a certain group that meets evenings at [facility].  Interventions: Cognitive Behavioral Therapy  Diagnosis: Major depressive disorder, recurrent, mild F33.0, Generalized Anxiety Disorder, 41.1   Psychiatric Treatment: Yes , via psychiatrist  Treatment Plan:  Client Abilities/Strengths [name] is expressive and engaging.  Support System: Skip, adult son  Client Treatment Preferences OPT  Client Statement of Needs Aldine would like to share my worries so I'll feel better.   Treatment Level Weekly  Symptoms  [name] experiences symptoms of depression and anxiety.  Goals:  Target Date: 09/07/24 Frequency: Weekly  Progress: 50% Modality: individual    Therapist will provide referrals for additional resources as appropriate.  Therapist will provide psycho-education regarding Aishi's diagnosis and corresponding treatment approaches and interventions. [name] will support the patient's ability to achieve the goals identified. will employ CBT, BA, Problem-solving, Solution Focused, Mindfulness,  coping skills, & other evidenced-based practices will be used to promote progress towards healthy functioning to help manage decrease symptoms associated with their diagnosis.   Reduce overall level, frequency, and intensity of the feelings of depression, anxiety and panic evidenced by decreased overall symptoms from 6 to 7 days/week to 0 to 1 days/week per client report for at least 3 consecutive months. Verbally express understanding of the relationship between feelings of depression and anxiety and their impact on thinking patterns and behaviors. Verbalize an understanding of the role that distorted thinking plays in creating fears, excessive worry,  and ruminations.  ([name] participated in the creation of the treatment plan)    [name]

## 2024-08-19 ENCOUNTER — Ambulatory Visit: Admitting: Psychology

## 2024-08-25 ENCOUNTER — Ambulatory Visit: Payer: Self-pay

## 2024-08-25 NOTE — Telephone Encounter (Signed)
 FYI Only or Action Required?: Action required by provider: request for appointment.  Patient was last seen in primary care on 07/21/2024 by Katrinka Garnette KIDD, MD.  Called Nurse Triage reporting Dizziness.  Symptoms began several weeks ago.  Interventions attempted: Nothing.  Symptoms are: unchanged.  Triage Disposition: See Physician Within 24 Hours  Patient/caregiver understands and will follow disposition?: No, wishes to speak with PCP   Copied from CRM #[id number]. Topic: Clinical - Red Word Triage >> Aug 25, 2024 10:16 AM Larissa RAMAN wrote: [facility] that prompted transfer to Nurse Triage: numbness in both legs, difficulty walking , fatigue Reason for Disposition  Taking a medicine that could cause dizziness (e.g., blood pressure medications, diuretics)  Answer Assessment - Initial Assessment Questions Patient declines appt today and request appt next week due to arranging transportation; 3 day notice.  Advised call back or ED/911 if symptoms occur/worsen: severe diff breathing, chest pain > 5 min, faint. Patient verbalized understanding.   1. DESCRIPTION: Describe your dizziness.     numbness in both legs, difficulty walking , fatigue; ongoing for last 6 months, MD aware 2. LIGHTHEADED: Do you feel lightheaded? (e.g., somewhat faint, woozy, weak upon standing)     Daily;  3. VERTIGO: Do you feel like either you or the room is spinning or tilting? (i.e., vertigo)     no 4. SEVERITY: How bad is it?  Do you feel like you are going to faint? Can you stand and walk?     yes 5. OET:  When did the dizziness begin?     Months ago 6. AGGRAVATING FACTORS: Does anything make it worse? (e.g., standing, change in head position) Standing/ walking 8. CAUSE: What do you think is causing the dizziness? (e.g., decreased fluids or food, diarrhea, emotional distress, heat exposure, new medicine, sudden standing, vomiting; unknown)    Unsure,thinks medications 10. OTHER  SYMPTOMS: Do you have any other symptoms? (e.g., fever, chest pain, vomiting, diarrhea, bleeding)      Denies diff breathing, chest pain, faint, fever chills n/v  Protocols used: Dizziness - Lightheadedness-A-AH

## 2024-08-25 NOTE — Telephone Encounter (Signed)
 Please call patient for appointment for dizziness.

## 2024-08-25 NOTE — Telephone Encounter (Signed)
 Spoke with pt. Stated this has been ongoing for over a month. Unable to come in until next week. Scheduled for 08/30/24.

## 2024-08-30 ENCOUNTER — Ambulatory Visit: Admitting: Psychology

## 2024-08-30 ENCOUNTER — Ambulatory Visit: Admitting: Family Medicine

## 2024-08-30 ENCOUNTER — Encounter: Payer: Self-pay | Admitting: Family Medicine

## 2024-08-30 VITALS — BP 138/78 | HR 69 | Temp 98.7°F | Ht 60.0 in | Wt 148.6 lb

## 2024-08-30 DIAGNOSIS — E538 Deficiency of other specified B group vitamins: Secondary | ICD-10-CM

## 2024-08-30 DIAGNOSIS — F330 Major depressive disorder, recurrent, mild: Secondary | ICD-10-CM

## 2024-08-30 DIAGNOSIS — G301 Alzheimer's disease with late onset: Secondary | ICD-10-CM | POA: Diagnosis not present

## 2024-08-30 DIAGNOSIS — Z20828 Contact with and (suspected) exposure to other viral communicable diseases: Secondary | ICD-10-CM | POA: Diagnosis not present

## 2024-08-30 DIAGNOSIS — I10 Essential (primary) hypertension: Secondary | ICD-10-CM | POA: Diagnosis not present

## 2024-08-30 DIAGNOSIS — F411 Generalized anxiety disorder: Secondary | ICD-10-CM

## 2024-08-30 DIAGNOSIS — R051 Acute cough: Secondary | ICD-10-CM

## 2024-08-30 DIAGNOSIS — Z20822 Contact with and (suspected) exposure to covid-19: Secondary | ICD-10-CM

## 2024-08-30 DIAGNOSIS — R300 Dysuria: Secondary | ICD-10-CM

## 2024-08-30 DIAGNOSIS — F02A2 Dementia in other diseases classified elsewhere, mild, with psychotic disturbance: Secondary | ICD-10-CM | POA: Diagnosis not present

## 2024-08-30 LAB — POC COVID19 BINAXNOW: SARS Coronavirus 2 Ag: NEGATIVE

## 2024-08-30 LAB — POCT INFLUENZA A/B
Influenza A, POC: NEGATIVE
Influenza B, POC: NEGATIVE

## 2024-08-30 NOTE — Progress Notes (Signed)
 " Phone [phone] In person visit   Subjective:   [name] is a 89 y.o. year old very pleasant female patient who presents for/with See problem oriented charting Chief Complaint  Patient presents with   Dizziness    Pt states she's had ongoing dizziness x1 month; she brought a list from her son of things he wants us  to discuss with her today- bilateral leg numbness, dizziness in the mornings, possible bladder infection, would like her to be tested for flu and covid since its going around [place], persistent diarrhea;     Past Medical History-  Patient Active Problem List   Diagnosis Date Noted   Dementia (HCC) 12/04/2022    Priority: High   Visual hallucinations     Priority: High   Generalized anxiety disorder 12/03/2022    Priority: Medium    Major depressive disorder in remission 10/22/2021    Priority: Medium    Aortic atherosclerosis 01/17/2021    Priority: Medium    Liver lesion 11/10/2017    Priority: Medium    Lumbar stenosis with neurogenic claudication 03/20/2016    Priority: Medium    Macular degeneration of right eye 03/03/2013    Priority: Medium    Glaucoma 03/03/2013    Priority: Medium    Hyperlipidemia 03/14/2009    Priority: Medium    Essential hypertension 10/19/2007    Priority: Medium    GERD (gastroesophageal reflux disease) 10/19/2007    Priority: Medium    Chest pain 01/24/2018    Priority: Low   Chronic daily headache 07/08/2017    Priority: Low   Post-traumatic headache 10/11/2015    Priority: Low   Osteoarthritis 10/19/2007    Priority: Low   Stress incontinence 10/19/2007    Priority: Low   Allergic rhinitis 10/19/2007    Priority: Low   Osteopenia of neck of right femur 03/29/2019    Medications- reviewed and updated Current Outpatient Medications  Medication Sig Dispense Refill   COMBIGAN  0.2-0.5 % ophthalmic solution instill 1 drop into both eyes twice a day. 5 mL 3   divalproex  (DEPAKOTE ) 125 MG DR tablet  Take 1 tablet (125 mg total) by mouth at bedtime. For hallucinations 30 tablet 5   donepezil  (ARICEPT ) 10 MG tablet Take 1 tablet (10 mg total) by mouth at bedtime. 30 tablet 11   ezetimibe -simvastatin  (VYTORIN ) 10-20 MG tablet TAKE ONE TABLET BY MOUTH DAILY 90 tablet 1   memantine  (NAMENDA ) 5 MG tablet Take 1 tablet (5 mg at night) for 2 weeks, then increase to 1 tablet (5 mg) twice a day 180 tablet 3   Menthol , Topical Analgesic, (BIOFREEZE) 10 % CREA Use up to twice daily as needed 85 g 2   Multiple Vitamins-Minerals (MULTIVITAMIN ADULT) CHEW Chew 2 each by mouth daily. Nature Made for Her     nadolol  (CORGARD ) 20 MG tablet TAKE ONE TABLET BY MOUTH AT BEDTIME 90 tablet 3   omeprazole  (PRILOSEC) 20 MG capsule Take 20 mg by mouth daily. In the morning for acid reflux over the counter     traZODone  (DESYREL ) 100 MG tablet take 1/4 - 1/2 TABLET AT BEDTIME 45 tablet 3   venlafaxine  XR (EFFEXOR -XR) 150 MG 24 hr capsule TAKE ONE CAPSULE BY MOUTH EVERY MORNING 90 capsule 3   cephALEXin  (KEFLEX ) 250 MG capsule Take 250 mg by mouth daily. (Patient not taking: Reported on 08/30/2024)     No current facility-administered medications for this visit.     Objective:  BP 138/78 (  BP Location: Left Arm, Cuff Size: Normal)   Pulse 69   Temp 98.7 F (37.1 C) (Temporal)   Ht 5' (1.524 m)   Wt 148 lb 9.6 oz (67.4 kg)   LMP 08/11/1974   SpO2 94%   BMI 29.02 kg/m  Gen: NAD, resting comfortably CV: RRR no murmurs rubs or gallops Lungs: CTAB no crackles, wheeze, rhonchi Ext: no edema Skin: warm, dry Neuro: no dizziness or vertigo reported with standing up quickly today.     Assessment and Plan   # vertigo/fatigue S: 1 month of symptoms reported by patient today looking back she noted dizziness in December 11 visit as well.  She noted fatigue and increased dizziness since going up on Depakote  so we went back down to once nightly instead of twice daily- she reports to me today that going back down did not  improve her symptoms.  We also wondered at that time if UTI could be contributing and we treated her for urinary tract infection- dizziness and fatigue did not improve. Newly reported today when she picks head up off of pillow gets spinning sensation and dizziness- we had not previously heard about the vertigo sensation. As she goes to sit up gets vertigo again if she stays still her symptom(s) improve- dizziness and vertigo. Doesn't stand up until symptom(s) resolve and when she stands up does not recur.  -no new hearing loss or ringing in the ears -if resting symptom(s) resolve -can happen if turns over in bed as well -symptoms are short lived   -We also noted blood sugar low at 61 and encouraged consistent meals at last visit  -Her macular degeneration likely contributes she also has history of lumbar stenosis with neurogenic claudication with prior surgery in 2019 -orthostatic vital signs today negative for orthostasis  A/P: I strongly suspect the room spinning interest he morning that is short lived is something called benign paroxysmal positional vertigo (BPPV) . The treatment for this is vestibular therapy- since transportation is an issue and the symptom(s) are mild you wanted to hold off for now but if new or worsening symptom(s) please schedule follow up   or we can order this -No tinnitus or new hearing loss to suggest Mnire's, episodes are brief and associated with head movement pointing away from vestibular neuritis.  History of migraines but with brevity of episodes I think this is less likely.  Doubt TIA or brainstem infarct or cerebellar infarction  #for fatigue- offered to update labs including B12 with paresthesias, CBC, CMP, TSH- but most of these checked a month ago and B12 in June- she ultimately opted out. No chest pain or shortness of breath thankfully-doubt cardiac cause.  No unintentional weight loss -Lives alone and not sure if she snores but also considered sleep apnea-I  think navigating testing and potential CPAP would be very challenging for her low we opted to hold off for now     # Bilateral leg weak S:first thing in the morning. She reports very off balanced and weak- walks with walker and prefers to have support.   A/P: I encouraged her to continue to work with physical therapy- hoping this will help her leg weakness sensation. If worsens particularly on one side could consider neuroimaging   # Flu and COVID testing-multiple infections around [place] and she requests testing today-asked her if she has any symptomswhich she reports there are none. Had already been tested by staff per protocol - she had initially noted cough but she did  not report this to me  #Concern for UTI S: Patients symptoms started abotu 2 weeks ago- started after she finished tx.  Complains of dysuria: yes; polyuria: yes; nocturia: incontinent at baseline; urgency: yes.  Symptoms are worsening in last week.  ROS- no fever, chills, nausea, vomiting, flank pain. No blood in urine.   A/P: UA will be obtained. Will get culture. Empiric treatment opting out. Would need to hold keflex  if present urinary tract infection- she believes still taking this from urology Patient to follow up if new or worsening symptoms or failure to improve.   #loose stools each morning- not watery. Feels oily food could contribute. Months of issues so likely not related to recent antibiotic(s) though. On her list is keflex  daily for urinary tract infection prevention which could contribute  # Alzheimer's per Dr. Richie on 01/15/2023 S: Medication: donepezil  10 mg daily, namenda  5 mg twice a day -Patient has dealt with hallucinations for years-mainly at night but keeping the blinds closed helps-depakote  has helped slightly with it being as pervasive 125 mg at night-tried twice daily but had more fatigue and dizziness reported  so we pulle dback A/P: Ongoing Alzheimer's with gradual decline.  Hallucinations  remain an issue but did not tolerate the higher doses so we pulled back although reports fatigue and dizziness previously reported are more fatigue and vertigo and that could be more BPPV related in fact she did not get better with reducing dose points away from medication being the cause so potentially could retrial if bothersome  # Depression S: Medication: Venlafaxine  150 mg extended release in the morning, trazodone  100 mg daily- hafl tablet  -working with therapy Jenkins Nicolas    08/30/2024    2:06 PM 07/21/2024   11:35 AM 06/30/2024    1:08 PM  Depression screen PHQ 2/9  Decreased Interest 1 2 0  Down, Depressed, Hopeless 1 2 0  PHQ - 2 Score 2 4 0  Altered sleeping 1 0 1  Tired, decreased energy 1 3 2   Change in appetite 0 0 0  Feeling bad or failure about yourself  0 0 0  Trouble concentrating 1 0 2  Moving slowly or fidgety/restless 0 0 0  Suicidal thoughts 0 0 0  PHQ-9 Score 5 7 5   Difficult doing work/chores Somewhat difficult Somewhat difficult Somewhat difficult  A/P: PHQ-9 score 5 which is I would consider reasonable control-with that being said it could contribute to some of her fatigue but much of her difficulties with living alone and dealing with her dementia-I do not think increasing her medication burden is the right move at her age unless substantial worsening  #hypertension S: medication: Nadolol  20 mg daily BP Readings from Last 3 Encounters:  08/30/24 138/78  07/21/24 128/62  06/30/24 110/64  A/P: Reasonable control-adherent medication  #hyperlipidemia # Aortic atherosclerosis S: Medication:Vytorin  10-20 mg daily  Lab Results  Component Value Date   CHOL 133 01/29/2024   HDL 43.60 01/29/2024   LDLCALC 44 01/29/2024   LDLDIRECT 84.0 03/06/2017   TRIG 224.0 (H) 01/29/2024   CHOLHDL 3 01/29/2024    A/P: Cholesterol very well-controlled-too soon for repeat    # B12 deficiency S: Current treatment/medication (oral vs. IM): 1000 mcg daily  Lab Results   Component Value Date   VITAMINB12 482 01/29/2024  A/P: B12 reasonable control-recheck next labs  Recommended follow up: Return in about 2 months (around 10/28/2024) for followup or sooner if needed.Schedule b4 you leave. Future Appointments  Date Time  Provider Department Center  09/05/2024 10:00 AM Maudie Jenkins [facility]O [facility] [address]  09/13/2024 10:00 AM Maudie Jenkins [facility]O [facility] [address]  09/19/2024 10:00 AM Maudie Jenkins [facility]O [facility] [address]  09/30/2024 10:00 AM Maudie Jenkins [facility] [address]  10/28/2024 11:00 AM Katrinka Garnette KIDD, MD [facility]  11/16/2024 11:00 AM Dina Camie BRAVO, PA-C [facility] None  11/23/2024  1:00 PM [facility] ANNUAL WELLNESS VISIT 1 [facility] [place]    Lab/Order associations:   ICD-10-CM   1. Mild late onset Alzheimer's dementia with psychotic disturbance (HCC)  G30.1    F02.A2     2. B12 deficiency  E53.8     3. Essential hypertension  I10     4. Acute cough  R05.1 POCT Influenza A/B    POC COVID-19    5. Exposure to COVID-19 virus  Z20.822 POC COVID-19    6. Exposure to the flu  Z20.828 POCT Influenza A/B    7. Dysuria  R30.0 Urinalysis, Routine w reflex microscopic    Urine Culture      No orders of the defined types were placed in this encounter.   Return precautions advised.  Garnette Katrinka, MD  "

## 2024-08-30 NOTE — Progress Notes (Unsigned)
 [facility] Counselor/Therapist Progress Note  Patient ID: [name], MRN: [id number]    Date: 08/30/24  Time Spent: 10:00 am -10:39 am : 39 minutes (Pt had a medical appt that she had to get ready for so we ended our session early.)  Treatment Type: Individual Therapy.  Reported Symptoms: Patient feels extremely tired and doesn't know why. She was looking forward to seeing her PCP later today.   Mental Status Exam: Appearance:  NA     Behavior: Sharing  Motor: Restlestness  Speech/Language:  Pressured  Affect: Depressed  Mood: anxious  Thought process: normal  Thought content:   WNL  Sensory/Perceptual disturbances:   WNL  Orientation: oriented to person, place, and time/date  Attention: Good  Concentration: Good  Memory: WNL  Fund of knowledge:  Good  Insight:   Good  Judgment:  Good  Impulse Control: Good   Risk Assessment: Danger to Self:  No Self-injurious Behavior: No Danger to Others: No Duty to Warn:no Physical Aggression / Violence:No  Access to Firearms a concern: No  Gang Involvement:No   Subjective:   [name] participated from home, via video, and consented to treatment. I discussed the limitations of evaluation and management by telemedicine and the availability of in person appointments. The patient expressed understanding and agreed to proceed.  Therapist participated from home office.  Omara reviewed the events of the past week.   Patient feels extremely tired and doesn't know why. She was looking forward to seeing her PCP later today. Patient continues to feel hopeless about her relationship with her daughter. Patient is trying to come to peace with the fact that her daughter has her reasons, even though patient has no idea what those reasons are for her behavior towards patient. Patient also feels nervous and restless.   Interventions: Cognitive Behavioral Therapy  Diagnosis: Major depressive disorder, recurrent, mild  F33.0, Generalized Anxiety Disorder, 41.1   Psychiatric Treatment: Yes , via psychiatrist  Treatment Plan:  Client Abilities/Strengths [name] is expressive and engaging  Support System: Skip, adult son  Client Treatment Preferences OPT  Client Statement of Needs Takeia would like to share my worries so I'll feel better.   Treatment Level Weekly  Symptoms  [name] experiences symptoms of depression and anxiety (Status : maintained)  Goals:  Target Date: 09/07/24 Frequency: Weekly  Progress: 55% Modality: individual    Therapist will provide referrals for additional resources as appropriate.  Therapist will provide psycho-education regarding Kandise's diagnosis and corresponding treatment approaches and interventions. [name] will support the patient's ability to achieve the goals identified. will employ CBT, BA, Problem-solving, Solution Focused, Mindfulness,  coping skills, & other evidenced-based practices will be used to promote progress towards healthy functioning to help manage decrease symptoms associated with their diagnosis.   Reduce overall level, frequency, and intensity of the feelings of depression, anxiety and panic evidenced by decreased overall symptoms from 6 to 7 days/week to 0 to 1 days/week per client report for at least 3 consecutive months. Verbally express understanding of the relationship between feelings of depression and anxiety and their impact on thinking patterns and behaviors. Verbalize an understanding of the role that distorted thinking plays in creating fears, excessive worry, and ruminations.  [name] participated in the creation of the treatment plan)   [name]

## 2024-08-30 NOTE — Patient Instructions (Addendum)
 I strongly suspect the room spinning interest he morning that is short lived is something called benign paroxysmal positional vertigo (BPPV) . The treatment for this is vestibular therapy- since transportatoin is an issue and the symptom(s) are mild you wanted to hold off for now but if new or worsening symptom(s) please schedule follow up   or we can order this  I encouraged her to continue to work with physical therapy- hoping this will help her leg weakness sensation. If worsens particularly on one side could consider neuroimaging   You wanted to bring a urine back- I'm out of the office this Thursday until next Friday the 30th- you can bring it back at that time but if symptom(s) worsen can bring further but need to alert my CMA Waddell as no one will see result note unless she advises them to  Could try align or florastor- sometimes putting good bacteria back in your gut can help protect from loose stools also antibiotic(s) we used recently may have negatively affected your stools  Recommended follow up: Return in about 2 months (around 10/28/2024) for followup or sooner if needed.Schedule b4 you leave.  PLEASE cancel your February 12th visit on way out

## 2024-09-02 ENCOUNTER — Ambulatory Visit: Admitting: Psychology

## 2024-09-03 ENCOUNTER — Other Ambulatory Visit: Payer: Self-pay | Admitting: Family Medicine

## 2024-09-05 ENCOUNTER — Ambulatory Visit: Admitting: Psychology

## 2024-09-05 DIAGNOSIS — F330 Major depressive disorder, recurrent, mild: Secondary | ICD-10-CM

## 2024-09-05 DIAGNOSIS — F411 Generalized anxiety disorder: Secondary | ICD-10-CM

## 2024-09-05 NOTE — Progress Notes (Unsigned)
 [facility] Counselor/Therapist Progress Note  Patient ID: [name], MRN: [id number]    Date: 09/05/24  Time Spent: 10:00 am - 10:53 am  Minutes  Treatment Type: Individual Therapy.  Reported Symptoms: ***  Mental Status Exam: Appearance:  NA     Behavior: {PSY:21022743}  Motor: {PSY:22302}  Speech/Language:  {PSY:22685}  Affect: {PSY:22687}  Mood: {PSY:31886}  Thought process: {PSY:31888}  Thought content:   {[phone]}  Sensory/Perceptual disturbances:   {PSY:[id number]}  Orientation: {PSY:30297}  Attention: {PSY:22877}  Concentration: {PSY:562-443-3736}  Memory: {[phone]}  Fund of knowledge:  {PSY:562-443-3736}  Insight:   {PSY:562-443-3736}  Judgment:  {PSY:562-443-3736}  Impulse Control: {PSY:562-443-3736}   Risk Assessment: Danger to Self:  No Self-injurious Behavior: No Danger to Others: No Duty to Warn:no Physical Aggression / Violence:No  Access to Firearms a concern: No  Gang Involvement:No   Subjective:   [name] participated from home, via phone, and consented to treatment. I discussed the limitations of evaluation and management by telemedicine and the availability of in person appointments. The patient expressed understanding and agreed to proceed.  Therapist participated from home office.  Mosetta reviewed the events of the past week.   ***   Interventions: Cognitive Behavioral Therapy  Diagnosis:  No diagnosis found.  Psychiatric Treatment: {YES/NO:21197}, ***  Treatment Plan:  Client Abilities/Strengths [name] ***  Support System: ***  Client Treatment Preferences ***  Client Statement of Needs [name] would like to ***   Treatment Level {Frequency of sessions.:26745}  Symptoms  ***   (Status: {Symptom Status:26744}) ***   (Status: {Symptom Status:26744})  Goals:   [name] experiences symptoms of ***   Target Date: *** Frequency: {Frequency of sessions.:26745}  Progress: 0 Modality:  individual    Therapist will provide referrals for additional resources as appropriate.  Therapist will provide psycho-education regarding [name]'s diagnosis and corresponding treatment approaches and interventions. [name] will support the patient's ability to achieve the goals identified. will employ CBT, BA, Problem-solving, Solution Focused, Mindfulness,  coping skills, & other evidenced-based practices will be used to promote progress towards healthy functioning to help manage decrease symptoms associated with {his/her/their:21314} diagnosis.   Reduce overall level, frequency, and intensity of the feelings of depression, anxiety and panic evidenced by decreased *** from 6 to 7 days/week to 0 to 1 days/week per client report for at least 3 consecutive months. Verbally express understanding of the relationship between feelings of ***depression, ***anxiety and their impact on thinking patterns and behaviors. Verbalize an understanding of the role that distorted thinking plays in creating fears, excessive worry, and ruminations.  ([name] participated in the creation of the treatment plan)   [name] [name]

## 2024-09-13 ENCOUNTER — Ambulatory Visit: Admitting: Psychology

## 2024-09-13 DIAGNOSIS — F411 Generalized anxiety disorder: Secondary | ICD-10-CM

## 2024-09-13 DIAGNOSIS — F330 Major depressive disorder, recurrent, mild: Secondary | ICD-10-CM

## 2024-09-13 NOTE — Progress Notes (Unsigned)
 [facility] Counselor/Therapist Progress Note  Patient ID: [name], MRN: [id number]    Date: 09/13/24   Time Spent: 10:08 am - 10:48 am : 40 minutes (We had planned to do the updated treatment plan today, but patient still had a UTI so she wasn't feeling well enough to do the treatment plan.)   Treatment Type: Individual Therapy.  Reported Symptoms: Pt sounded more energized today. Patient asked that I use her land line [phone] to call her for our sessions. I should use [phone] her cell phone.   Mental Status Exam: Appearance:  {PSY:22683}     Behavior: {PSY:21022743}  Motor: {PSY:22302}  Speech/Language:  {PSY:22685}  Affect: {PSY:22687}  Mood: {PSY:31886}  Thought process: {PSY:31888}  Thought content:   {[phone]}  Sensory/Perceptual disturbances:   {PSY:845-838-0530}  Orientation: {PSY:30297}  Attention: {PSY:22877}  Concentration: {PSY:319-383-8737}  Memory: {[phone]}  Fund of knowledge:  {PSY:319-383-8737}  Insight:   {PSY:319-383-8737}  Judgment:  {PSY:319-383-8737}  Impulse Control: {PSY:319-383-8737}   Risk Assessment: Danger to Self:  {PSY:22692} Self-injurious Behavior: {PSY:22692} Danger to Others: {PSY:22692} Duty to Warn:{PSY:311194} Physical Aggression / Violence:{PSY:21197} Access to Firearms a concern: {PSY:21197} Gang Involvement:{PSY:21197}  Subjective:   [name] participated from {Patient Location:[address]::home}, via {ONMVIDEOORPHONE:26699}, and consented to treatment. I discussed the limitations of evaluation and management by telemedicine and the availability of in person appointments. The patient expressed understanding and agreed to proceed.  Therapist participated from {onmtherapistlocation:[address]}.  [name] reviewed the events of the past week.   ***   Interventions: {PSY:262-638-2100}  Diagnosis:  No diagnosis found.  Psychiatric Treatment: {YES/NO:21197}, ***  Treatment Plan:  Client  Abilities/Strengths Todd ***  Support System: ***  Client Treatment Preferences ***  Client Statement of Needs [name] would like to ***   Treatment Level {Frequency of sessions.:26745}  Symptoms  ***   (Status: {Symptom Status:26744}) ***   (Status: {Symptom Status:26744})  Goals:   [name] experiences symptoms of ***   Target Date: *** Frequency: {Frequency of sessions.:26745}  Progress: 0 Modality: individual    Therapist will provide referrals for additional resources as appropriate.  Therapist will provide psycho-education regarding [name]'s diagnosis and corresponding treatment approaches and interventions. [name] will support the patient's ability to achieve the goals identified. will employ CBT, BA, Problem-solving, Solution Focused, Mindfulness,  coping skills, & other evidenced-based practices will be used to promote progress towards healthy functioning to help manage decrease symptoms associated with {his/her/their:21314} diagnosis.   Reduce overall level, frequency, and intensity of the feelings of depression, anxiety and panic evidenced by decreased *** from 6 to 7 days/week to 0 to 1 days/week per client report for at least 3 consecutive months. Verbally express understanding of the relationship between feelings of ***depression, ***anxiety and their impact on thinking patterns and behaviors. Verbalize an understanding of the role that distorted thinking plays in creating fears, excessive worry, and ruminations.  ([name] participated in the creation of the treatment plan)   [name] [name]

## 2024-09-16 ENCOUNTER — Ambulatory Visit: Admitting: Psychology

## 2024-09-19 ENCOUNTER — Ambulatory Visit: Admitting: Psychology

## 2024-09-22 ENCOUNTER — Ambulatory Visit: Admitting: Family Medicine

## 2024-09-30 ENCOUNTER — Ambulatory Visit: Admitting: Psychology

## 2024-10-14 ENCOUNTER — Ambulatory Visit: Admitting: Psychology

## 2024-10-28 ENCOUNTER — Ambulatory Visit: Admitting: Psychology

## 2024-10-28 ENCOUNTER — Ambulatory Visit: Admitting: Family Medicine

## 2024-11-16 ENCOUNTER — Ambulatory Visit: Admitting: Physician Assistant

## 2024-11-23 ENCOUNTER — Ambulatory Visit
